# Patient Record
Sex: Female | Born: 1935
Health system: Southern US, Community
[De-identification: ages and names within clinical notes are randomized; demographics above are authoritative.]

## PROBLEM LIST (undated history)

## (undated) DIAGNOSIS — E785 Hyperlipidemia, unspecified: Secondary | ICD-10-CM

## (undated) DIAGNOSIS — Z8781 Personal history of (healed) traumatic fracture: Secondary | ICD-10-CM

## (undated) DIAGNOSIS — E538 Deficiency of other specified B group vitamins: Secondary | ICD-10-CM

## (undated) DIAGNOSIS — Z860100 Personal history of colon polyps, unspecified: Secondary | ICD-10-CM

## (undated) DIAGNOSIS — K5730 Diverticulosis of large intestine without perforation or abscess without bleeding: Secondary | ICD-10-CM

## (undated) DIAGNOSIS — E162 Hypoglycemia, unspecified: Secondary | ICD-10-CM

## (undated) DIAGNOSIS — F419 Anxiety disorder, unspecified: Secondary | ICD-10-CM

## (undated) DIAGNOSIS — D649 Anemia, unspecified: Secondary | ICD-10-CM

## (undated) DIAGNOSIS — G8929 Other chronic pain: Secondary | ICD-10-CM

## (undated) DIAGNOSIS — M1990 Unspecified osteoarthritis, unspecified site: Secondary | ICD-10-CM

## (undated) DIAGNOSIS — G629 Polyneuropathy, unspecified: Secondary | ICD-10-CM

## (undated) DIAGNOSIS — K5520 Angiodysplasia of colon without hemorrhage: Secondary | ICD-10-CM

## (undated) DIAGNOSIS — S329XXA Fracture of unspecified parts of lumbosacral spine and pelvis, initial encounter for closed fracture: Secondary | ICD-10-CM

## (undated) DIAGNOSIS — I10 Essential (primary) hypertension: Secondary | ICD-10-CM

## (undated) DIAGNOSIS — K219 Gastro-esophageal reflux disease without esophagitis: Secondary | ICD-10-CM

## (undated) DIAGNOSIS — M545 Low back pain: Secondary | ICD-10-CM

## (undated) HISTORY — PX: COLONOSCOPY W/ POLYPECTOMY: SHX1380

## (undated) HISTORY — PX: FACIAL COSMETIC SURGERY: SHX629

## (undated) HISTORY — DX: Diverticulosis of large intestine without perforation or abscess without bleeding: K57.30

## (undated) HISTORY — DX: Hyperlipidemia, unspecified: E78.5

## (undated) HISTORY — DX: Low back pain: M54.5

## (undated) HISTORY — DX: Essential (primary) hypertension: I10

## (undated) HISTORY — DX: Other chronic pain: G89.29

## (undated) HISTORY — PX: TUBAL LIGATION: SHX77

## (undated) HISTORY — PX: DILATION AND CURETTAGE OF UTERUS: SHX78

## (undated) HISTORY — DX: Unspecified osteoarthritis, unspecified site: M19.90

## (undated) HISTORY — DX: Personal history of colon polyps, unspecified: Z86.0100

## (undated) HISTORY — DX: Gastro-esophageal reflux disease without esophagitis: K21.9

## (undated) HISTORY — DX: Personal history of colonic polyps: Z86.010

## (undated) HISTORY — PX: TEAR DUCT PROBING: SHX793

## (undated) HISTORY — DX: Angiodysplasia of colon without hemorrhage: K55.20

## (undated) HISTORY — PX: HEMORROIDECTOMY: SUR656

## (undated) HISTORY — DX: Polyneuropathy, unspecified: G62.9

## (undated) HISTORY — DX: Hypoglycemia, unspecified: E16.2

## (undated) HISTORY — DX: Anxiety disorder, unspecified: F41.9

## (undated) HISTORY — DX: Fracture of unspecified parts of lumbosacral spine and pelvis, initial encounter for closed fracture: S32.9XXA

## (undated) HISTORY — DX: Deficiency of other specified B group vitamins: E53.8

## (undated) HISTORY — PX: OTHER SURGICAL HISTORY: SHX169

## (undated) HISTORY — DX: Personal history of (healed) traumatic fracture: Z87.81

---

## 1997-09-11 ENCOUNTER — Ambulatory Visit (HOSPITAL_COMMUNITY): Admission: RE | Admit: 1997-09-11 | Discharge: 1997-09-11 | Payer: Self-pay | Admitting: Internal Medicine

## 1998-04-05 HISTORY — PX: TOTAL HIP ARTHROPLASTY: SHX124

## 1998-05-22 ENCOUNTER — Encounter: Payer: Self-pay | Admitting: Orthopaedic Surgery

## 1998-05-27 ENCOUNTER — Inpatient Hospital Stay (HOSPITAL_COMMUNITY): Admission: RE | Admit: 1998-05-27 | Discharge: 1998-06-02 | Payer: Self-pay | Admitting: Orthopaedic Surgery

## 1998-05-27 ENCOUNTER — Encounter: Payer: Self-pay | Admitting: Orthopaedic Surgery

## 1999-03-31 ENCOUNTER — Encounter: Payer: Self-pay | Admitting: Orthopaedic Surgery

## 1999-03-31 ENCOUNTER — Encounter: Admission: RE | Admit: 1999-03-31 | Discharge: 1999-03-31 | Payer: Self-pay | Admitting: Orthopaedic Surgery

## 1999-08-31 ENCOUNTER — Encounter: Admission: RE | Admit: 1999-08-31 | Discharge: 1999-08-31 | Payer: Self-pay | Admitting: Obstetrics and Gynecology

## 1999-08-31 ENCOUNTER — Encounter: Payer: Self-pay | Admitting: Obstetrics and Gynecology

## 1999-09-04 ENCOUNTER — Encounter: Payer: Self-pay | Admitting: Obstetrics and Gynecology

## 1999-09-04 ENCOUNTER — Encounter: Admission: RE | Admit: 1999-09-04 | Discharge: 1999-09-04 | Payer: Self-pay

## 2000-09-27 ENCOUNTER — Encounter: Admission: RE | Admit: 2000-09-27 | Discharge: 2000-09-27 | Payer: Self-pay | Admitting: Obstetrics and Gynecology

## 2000-09-27 ENCOUNTER — Encounter: Payer: Self-pay | Admitting: Obstetrics and Gynecology

## 2000-10-04 ENCOUNTER — Encounter: Admission: RE | Admit: 2000-10-04 | Discharge: 2000-10-04 | Payer: Self-pay | Admitting: Orthopedic Surgery

## 2000-10-04 ENCOUNTER — Encounter: Payer: Self-pay | Admitting: Orthopedic Surgery

## 2001-10-04 ENCOUNTER — Encounter: Admission: RE | Admit: 2001-10-04 | Discharge: 2001-10-04 | Payer: Self-pay | Admitting: Obstetrics and Gynecology

## 2001-10-04 ENCOUNTER — Encounter: Payer: Self-pay | Admitting: Obstetrics and Gynecology

## 2002-10-18 ENCOUNTER — Encounter: Admission: RE | Admit: 2002-10-18 | Discharge: 2002-10-18 | Payer: Self-pay | Admitting: Obstetrics and Gynecology

## 2002-10-18 ENCOUNTER — Encounter: Payer: Self-pay | Admitting: Obstetrics and Gynecology

## 2003-11-14 ENCOUNTER — Encounter: Admission: RE | Admit: 2003-11-14 | Discharge: 2003-11-14 | Payer: Self-pay | Admitting: Obstetrics and Gynecology

## 2004-02-12 ENCOUNTER — Ambulatory Visit: Payer: Self-pay | Admitting: Internal Medicine

## 2004-04-05 DIAGNOSIS — E785 Hyperlipidemia, unspecified: Secondary | ICD-10-CM

## 2004-04-05 HISTORY — DX: Hyperlipidemia, unspecified: E78.5

## 2004-10-30 ENCOUNTER — Ambulatory Visit: Payer: Self-pay | Admitting: Internal Medicine

## 2004-11-05 ENCOUNTER — Ambulatory Visit: Payer: Self-pay

## 2004-11-19 ENCOUNTER — Encounter: Admission: RE | Admit: 2004-11-19 | Discharge: 2004-11-19 | Payer: Self-pay | Admitting: Obstetrics and Gynecology

## 2004-11-23 ENCOUNTER — Ambulatory Visit: Payer: Self-pay | Admitting: Internal Medicine

## 2004-12-03 ENCOUNTER — Encounter: Admission: RE | Admit: 2004-12-03 | Discharge: 2004-12-03 | Payer: Self-pay | Admitting: Obstetrics and Gynecology

## 2005-01-22 ENCOUNTER — Ambulatory Visit: Payer: Self-pay | Admitting: Internal Medicine

## 2005-04-05 DIAGNOSIS — K5520 Angiodysplasia of colon without hemorrhage: Secondary | ICD-10-CM

## 2005-04-05 HISTORY — DX: Angiodysplasia of colon without hemorrhage: K55.20

## 2005-05-10 ENCOUNTER — Ambulatory Visit: Payer: Self-pay | Admitting: Internal Medicine

## 2005-05-13 ENCOUNTER — Encounter: Admission: RE | Admit: 2005-05-13 | Discharge: 2005-05-13 | Payer: Self-pay | Admitting: Obstetrics and Gynecology

## 2005-09-06 ENCOUNTER — Ambulatory Visit: Payer: Self-pay | Admitting: Internal Medicine

## 2005-11-03 ENCOUNTER — Ambulatory Visit: Payer: Self-pay | Admitting: Internal Medicine

## 2005-11-09 ENCOUNTER — Ambulatory Visit: Payer: Self-pay | Admitting: Internal Medicine

## 2005-11-12 ENCOUNTER — Ambulatory Visit: Payer: Self-pay | Admitting: Family Medicine

## 2005-12-02 ENCOUNTER — Encounter: Admission: RE | Admit: 2005-12-02 | Discharge: 2005-12-02 | Payer: Self-pay | Admitting: Obstetrics and Gynecology

## 2005-12-10 ENCOUNTER — Ambulatory Visit: Payer: Self-pay | Admitting: Gastroenterology

## 2005-12-13 ENCOUNTER — Ambulatory Visit: Payer: Self-pay | Admitting: Gastroenterology

## 2006-01-25 ENCOUNTER — Ambulatory Visit: Payer: Self-pay | Admitting: Internal Medicine

## 2006-02-21 ENCOUNTER — Ambulatory Visit (HOSPITAL_COMMUNITY): Admission: RE | Admit: 2006-02-21 | Discharge: 2006-02-21 | Payer: Self-pay | Admitting: Specialist

## 2006-03-21 ENCOUNTER — Ambulatory Visit (HOSPITAL_COMMUNITY): Admission: RE | Admit: 2006-03-21 | Discharge: 2006-03-21 | Payer: Self-pay | Admitting: Specialist

## 2006-06-30 DIAGNOSIS — G609 Hereditary and idiopathic neuropathy, unspecified: Secondary | ICD-10-CM | POA: Insufficient documentation

## 2006-06-30 DIAGNOSIS — E162 Hypoglycemia, unspecified: Secondary | ICD-10-CM

## 2006-08-05 ENCOUNTER — Ambulatory Visit: Payer: Self-pay | Admitting: Internal Medicine

## 2006-08-05 LAB — CONVERTED CEMR LAB
Basophils Relative: 0.2 % (ref 0.0–1.0)
CK-MB: 0.8 ng/mL (ref 0.3–4.0)
Eosinophils Absolute: 0.1 10*3/uL (ref 0.0–0.6)
HCT: 36.4 % (ref 36.0–46.0)
Hemoglobin: 12 g/dL (ref 12.0–15.0)
Lymphocytes Relative: 32.5 % (ref 12.0–46.0)
MCHC: 33 g/dL (ref 30.0–36.0)
Monocytes Absolute: 0.6 10*3/uL (ref 0.2–0.7)
Monocytes Relative: 10.1 % (ref 3.0–11.0)
Neutrophils Relative %: 55.5 % (ref 43.0–77.0)
Total CK: 50 units/L (ref 7–177)
WBC: 6.2 10*3/uL (ref 4.5–10.5)

## 2006-08-10 ENCOUNTER — Encounter: Payer: Self-pay | Admitting: Internal Medicine

## 2006-08-10 ENCOUNTER — Ambulatory Visit: Payer: Self-pay | Admitting: Internal Medicine

## 2006-08-17 ENCOUNTER — Ambulatory Visit: Payer: Self-pay | Admitting: Internal Medicine

## 2006-09-26 ENCOUNTER — Encounter: Payer: Self-pay | Admitting: Internal Medicine

## 2006-12-07 ENCOUNTER — Encounter: Admission: RE | Admit: 2006-12-07 | Discharge: 2006-12-07 | Payer: Self-pay | Admitting: Obstetrics and Gynecology

## 2007-01-23 ENCOUNTER — Ambulatory Visit: Payer: Self-pay | Admitting: Internal Medicine

## 2007-01-23 DIAGNOSIS — E782 Mixed hyperlipidemia: Secondary | ICD-10-CM

## 2007-01-23 DIAGNOSIS — I10 Essential (primary) hypertension: Secondary | ICD-10-CM | POA: Insufficient documentation

## 2007-01-23 DIAGNOSIS — M810 Age-related osteoporosis without current pathological fracture: Secondary | ICD-10-CM

## 2007-01-23 LAB — CONVERTED CEMR LAB
Cholesterol, target level: 200 mg/dL
HDL goal, serum: 40 mg/dL
LDL Goal: 160 mg/dL

## 2007-01-25 ENCOUNTER — Encounter (INDEPENDENT_AMBULATORY_CARE_PROVIDER_SITE_OTHER): Payer: Self-pay | Admitting: *Deleted

## 2007-02-01 ENCOUNTER — Ambulatory Visit: Payer: Self-pay | Admitting: Internal Medicine

## 2007-02-01 ENCOUNTER — Encounter (INDEPENDENT_AMBULATORY_CARE_PROVIDER_SITE_OTHER): Payer: Self-pay | Admitting: *Deleted

## 2007-05-17 ENCOUNTER — Ambulatory Visit: Payer: Self-pay | Admitting: Internal Medicine

## 2007-05-17 LAB — CONVERTED CEMR LAB
Protein, U semiquant: NEGATIVE
Specific Gravity, Urine: 1.015
Urobilinogen, UA: NEGATIVE
pH: 6.5

## 2007-05-22 ENCOUNTER — Encounter (INDEPENDENT_AMBULATORY_CARE_PROVIDER_SITE_OTHER): Payer: Self-pay | Admitting: *Deleted

## 2007-06-08 ENCOUNTER — Telehealth (INDEPENDENT_AMBULATORY_CARE_PROVIDER_SITE_OTHER): Payer: Self-pay | Admitting: *Deleted

## 2007-08-16 ENCOUNTER — Telehealth (INDEPENDENT_AMBULATORY_CARE_PROVIDER_SITE_OTHER): Payer: Self-pay | Admitting: *Deleted

## 2007-08-17 ENCOUNTER — Encounter (INDEPENDENT_AMBULATORY_CARE_PROVIDER_SITE_OTHER): Payer: Self-pay | Admitting: *Deleted

## 2007-08-17 ENCOUNTER — Ambulatory Visit: Payer: Self-pay | Admitting: Internal Medicine

## 2007-08-17 DIAGNOSIS — R5381 Other malaise: Secondary | ICD-10-CM

## 2007-08-17 DIAGNOSIS — R5383 Other fatigue: Secondary | ICD-10-CM

## 2007-08-17 DIAGNOSIS — F411 Generalized anxiety disorder: Secondary | ICD-10-CM | POA: Insufficient documentation

## 2007-08-17 LAB — CONVERTED CEMR LAB
Basophils Relative: 0.4 % (ref 0.0–1.0)
Eosinophils Absolute: 0.1 10*3/uL (ref 0.0–0.7)
Eosinophils Relative: 1.4 % (ref 0.0–5.0)
Hemoglobin: 13.1 g/dL (ref 12.0–15.0)
MCV: 98.8 fL (ref 78.0–100.0)
Neutrophils Relative %: 57.4 % (ref 43.0–77.0)
Potassium: 4.9 meq/L (ref 3.5–5.1)
RBC: 3.98 M/uL (ref 3.87–5.11)
RDW: 14.1 % (ref 11.5–14.6)

## 2007-08-22 ENCOUNTER — Encounter: Payer: Self-pay | Admitting: Internal Medicine

## 2007-08-24 ENCOUNTER — Encounter (INDEPENDENT_AMBULATORY_CARE_PROVIDER_SITE_OTHER): Payer: Self-pay | Admitting: *Deleted

## 2007-09-08 DIAGNOSIS — K5730 Diverticulosis of large intestine without perforation or abscess without bleeding: Secondary | ICD-10-CM | POA: Insufficient documentation

## 2007-09-08 DIAGNOSIS — Z860100 Personal history of colon polyps, unspecified: Secondary | ICD-10-CM | POA: Insufficient documentation

## 2007-09-08 DIAGNOSIS — K5521 Angiodysplasia of colon with hemorrhage: Secondary | ICD-10-CM | POA: Insufficient documentation

## 2007-09-08 DIAGNOSIS — M1990 Unspecified osteoarthritis, unspecified site: Secondary | ICD-10-CM | POA: Insufficient documentation

## 2007-09-08 DIAGNOSIS — M129 Arthropathy, unspecified: Secondary | ICD-10-CM | POA: Insufficient documentation

## 2007-09-12 ENCOUNTER — Ambulatory Visit: Payer: Self-pay | Admitting: Gastroenterology

## 2007-09-15 ENCOUNTER — Ambulatory Visit: Payer: Self-pay | Admitting: Internal Medicine

## 2007-09-15 DIAGNOSIS — T887XXA Unspecified adverse effect of drug or medicament, initial encounter: Secondary | ICD-10-CM

## 2007-12-25 ENCOUNTER — Telehealth (INDEPENDENT_AMBULATORY_CARE_PROVIDER_SITE_OTHER): Payer: Self-pay | Admitting: *Deleted

## 2008-01-10 ENCOUNTER — Encounter: Admission: RE | Admit: 2008-01-10 | Discharge: 2008-01-10 | Payer: Self-pay | Admitting: Neurology

## 2008-01-10 ENCOUNTER — Encounter (INDEPENDENT_AMBULATORY_CARE_PROVIDER_SITE_OTHER): Payer: Self-pay | Admitting: *Deleted

## 2008-01-23 ENCOUNTER — Encounter: Payer: Self-pay | Admitting: Internal Medicine

## 2008-01-24 ENCOUNTER — Ambulatory Visit: Payer: Self-pay | Admitting: Internal Medicine

## 2008-01-26 ENCOUNTER — Encounter (INDEPENDENT_AMBULATORY_CARE_PROVIDER_SITE_OTHER): Payer: Self-pay | Admitting: *Deleted

## 2008-01-26 LAB — CONVERTED CEMR LAB
Alkaline Phosphatase: 48 units/L (ref 39–117)
BUN: 18 mg/dL (ref 6–23)
Basophils Absolute: 0 10*3/uL (ref 0.0–0.1)
CO2: 31 meq/L (ref 19–32)
Chloride: 104 meq/L (ref 96–112)
Cholesterol: 225 mg/dL (ref 0–200)
Creatinine, Ser: 1.1 mg/dL (ref 0.4–1.2)
Eosinophils Absolute: 0.1 10*3/uL (ref 0.0–0.7)
Eosinophils Relative: 2 % (ref 0.0–5.0)
Hemoglobin: 13.2 g/dL (ref 12.0–15.0)
MCHC: 33.2 g/dL (ref 30.0–36.0)
Monocytes Relative: 6.2 % (ref 3.0–12.0)
Neutro Abs: 3.6 10*3/uL (ref 1.4–7.7)
Neutrophils Relative %: 61.2 % (ref 43.0–77.0)
Potassium: 4.7 meq/L (ref 3.5–5.1)
RDW: 13.1 % (ref 11.5–14.6)
TSH: 2.09 microintl units/mL (ref 0.35–5.50)
Triglycerides: 96 mg/dL (ref 0–149)
VLDL: 19 mg/dL (ref 0–40)
Vit D, 1,25-Dihydroxy: 45 (ref 30–89)

## 2008-01-29 ENCOUNTER — Ambulatory Visit: Payer: Self-pay | Admitting: Family Medicine

## 2008-01-29 ENCOUNTER — Encounter: Payer: Self-pay | Admitting: Internal Medicine

## 2008-02-06 ENCOUNTER — Encounter: Admission: RE | Admit: 2008-02-06 | Discharge: 2008-02-06 | Payer: Self-pay | Admitting: Gynecology

## 2008-02-19 ENCOUNTER — Encounter (INDEPENDENT_AMBULATORY_CARE_PROVIDER_SITE_OTHER): Payer: Self-pay | Admitting: *Deleted

## 2008-02-19 ENCOUNTER — Telehealth (INDEPENDENT_AMBULATORY_CARE_PROVIDER_SITE_OTHER): Payer: Self-pay | Admitting: *Deleted

## 2008-06-01 ENCOUNTER — Ambulatory Visit: Payer: Self-pay | Admitting: Family Medicine

## 2008-06-01 LAB — CONVERTED CEMR LAB
Bilirubin Urine: NEGATIVE
Ketones, urine, test strip: NEGATIVE
Nitrite: NEGATIVE
Protein, U semiquant: NEGATIVE
Specific Gravity, Urine: 1.015
Urobilinogen, UA: 0.2

## 2008-07-30 ENCOUNTER — Encounter: Payer: Self-pay | Admitting: Internal Medicine

## 2008-10-02 ENCOUNTER — Ambulatory Visit: Payer: Self-pay | Admitting: Internal Medicine

## 2008-10-08 ENCOUNTER — Encounter (INDEPENDENT_AMBULATORY_CARE_PROVIDER_SITE_OTHER): Payer: Self-pay | Admitting: *Deleted

## 2008-10-08 LAB — CONVERTED CEMR LAB
ALT: 16 units/L (ref 0–35)
AST: 19 units/L (ref 0–37)
Basophils Relative: 0.3 % (ref 0.0–3.0)
Calcium: 9.8 mg/dL (ref 8.4–10.5)
Chloride: 103 meq/L (ref 96–112)
Free T4: 1.1 ng/dL (ref 0.6–1.6)
GFR calc non Af Amer: 51.56 mL/min (ref >60)
Glucose, Bld: 82 mg/dL (ref 70–99)
Lymphs Abs: 1.7 10*3/uL (ref 0.7–4.0)
MCHC: 34.9 g/dL (ref 30.0–36.0)
Monocytes Absolute: 0.6 10*3/uL (ref 0.1–1.0)
Neutro Abs: 4.5 10*3/uL (ref 1.4–7.7)
RDW: 13.4 % (ref 11.5–14.6)
Sed Rate: 18 mm/hr (ref 0–22)
TSH: 1.7 microintl units/mL (ref 0.35–5.50)
WBC: 6.8 10*3/uL (ref 4.5–10.5)

## 2008-10-09 ENCOUNTER — Ambulatory Visit: Payer: Self-pay | Admitting: Internal Medicine

## 2008-10-09 ENCOUNTER — Encounter (INDEPENDENT_AMBULATORY_CARE_PROVIDER_SITE_OTHER): Payer: Self-pay | Admitting: *Deleted

## 2008-10-09 LAB — CONVERTED CEMR LAB: OCCULT 1: NEGATIVE

## 2008-10-23 ENCOUNTER — Telehealth (INDEPENDENT_AMBULATORY_CARE_PROVIDER_SITE_OTHER): Payer: Self-pay | Admitting: *Deleted

## 2009-01-16 ENCOUNTER — Ambulatory Visit (HOSPITAL_COMMUNITY): Admission: RE | Admit: 2009-01-16 | Discharge: 2009-01-16 | Payer: Self-pay | Admitting: Orthopedic Surgery

## 2009-02-11 ENCOUNTER — Encounter: Admission: RE | Admit: 2009-02-11 | Discharge: 2009-02-11 | Payer: Self-pay | Admitting: Gynecology

## 2009-02-19 ENCOUNTER — Encounter: Payer: Self-pay | Admitting: Internal Medicine

## 2009-02-20 ENCOUNTER — Encounter: Payer: Self-pay | Admitting: Internal Medicine

## 2009-03-06 ENCOUNTER — Ambulatory Visit: Payer: Self-pay | Admitting: Internal Medicine

## 2009-03-06 DIAGNOSIS — R109 Unspecified abdominal pain: Secondary | ICD-10-CM

## 2009-03-06 DIAGNOSIS — D487 Neoplasm of uncertain behavior of other specified sites: Secondary | ICD-10-CM

## 2009-03-06 LAB — CONVERTED CEMR LAB
Glucose, Urine, Semiquant: NEGATIVE
Ketones, urine, test strip: NEGATIVE
Nitrite: NEGATIVE

## 2009-03-07 ENCOUNTER — Encounter (INDEPENDENT_AMBULATORY_CARE_PROVIDER_SITE_OTHER): Payer: Self-pay | Admitting: *Deleted

## 2009-03-07 LAB — CONVERTED CEMR LAB
Basophils Absolute: 0 10*3/uL (ref 0.0–0.1)
Basophils Relative: 0.5 % (ref 0.0–3.0)
CO2: 29 meq/L (ref 19–32)
Creatinine, Ser: 1.2 mg/dL (ref 0.4–1.2)
Eosinophils Absolute: 0.1 10*3/uL (ref 0.0–0.7)
Eosinophils Relative: 1.8 % (ref 0.0–5.0)
GFR calc non Af Amer: 46.58 mL/min (ref >60)
Glucose, Bld: 77 mg/dL (ref 70–99)
HDL: 81.3 mg/dL (ref >39.00)
Hemoglobin: 12.3 g/dL (ref 12.0–15.0)
Lymphs Abs: 1.6 10*3/uL (ref 0.7–4.0)
MCHC: 33 g/dL (ref 30.0–36.0)
MCV: 94.9 fL (ref 78.0–100.0)
Monocytes Relative: 8.9 % (ref 3.0–12.0)
Platelets: 386 10*3/uL (ref 150.0–400.0)
Potassium: 4.1 meq/L (ref 3.5–5.1)
RBC: 3.93 M/uL (ref 3.87–5.11)
Sodium: 140 meq/L (ref 135–145)
TSH: 2.07 microintl units/mL (ref 0.35–5.50)
Total CHOL/HDL Ratio: 3

## 2009-03-12 ENCOUNTER — Telehealth (INDEPENDENT_AMBULATORY_CARE_PROVIDER_SITE_OTHER): Payer: Self-pay | Admitting: *Deleted

## 2009-04-05 HISTORY — PX: COLONOSCOPY: SHX174

## 2009-04-10 ENCOUNTER — Encounter: Payer: Self-pay | Admitting: Internal Medicine

## 2009-06-23 ENCOUNTER — Ambulatory Visit: Payer: Self-pay | Admitting: Internal Medicine

## 2009-07-09 ENCOUNTER — Telehealth (INDEPENDENT_AMBULATORY_CARE_PROVIDER_SITE_OTHER): Payer: Self-pay | Admitting: *Deleted

## 2009-07-21 ENCOUNTER — Encounter: Payer: Self-pay | Admitting: Internal Medicine

## 2009-08-06 ENCOUNTER — Telehealth: Payer: Self-pay | Admitting: Gastroenterology

## 2009-08-12 ENCOUNTER — Ambulatory Visit: Payer: Self-pay | Admitting: Internal Medicine

## 2009-08-12 DIAGNOSIS — R0609 Other forms of dyspnea: Secondary | ICD-10-CM

## 2009-08-12 DIAGNOSIS — R0989 Other specified symptoms and signs involving the circulatory and respiratory systems: Secondary | ICD-10-CM

## 2009-08-12 DIAGNOSIS — K219 Gastro-esophageal reflux disease without esophagitis: Secondary | ICD-10-CM | POA: Insufficient documentation

## 2009-08-19 DIAGNOSIS — R079 Chest pain, unspecified: Secondary | ICD-10-CM | POA: Insufficient documentation

## 2009-08-21 ENCOUNTER — Ambulatory Visit: Payer: Self-pay | Admitting: Internal Medicine

## 2009-08-22 LAB — CONVERTED CEMR LAB
Alkaline Phosphatase: 70 units/L (ref 39–117)
BUN: 26 mg/dL — ABNORMAL HIGH (ref 6–23)
Basophils Absolute: 0.1 10*3/uL (ref 0.0–0.1)
Basophils Relative: 0.7 % (ref 0.0–3.0)
Bilirubin, Direct: 0.1 mg/dL (ref 0.0–0.3)
Calcium: 9.5 mg/dL (ref 8.4–10.5)
Chloride: 100 meq/L (ref 96–112)
Creatinine, Ser: 1.5 mg/dL — ABNORMAL HIGH (ref 0.4–1.2)
Eosinophils Absolute: 0.5 10*3/uL (ref 0.0–0.7)
Eosinophils Relative: 4.9 % (ref 0.0–5.0)
GFR calc non Af Amer: 37.1 mL/min (ref >60)
Glucose, Bld: 85 mg/dL (ref 70–99)
HCT: 34.5 % — ABNORMAL LOW (ref 36.0–46.0)
Lymphs Abs: 1.6 10*3/uL (ref 0.7–4.0)
Monocytes Relative: 7.8 % (ref 3.0–12.0)
Neutro Abs: 6.6 10*3/uL (ref 1.4–7.7)
Potassium: 5.9 meq/L — ABNORMAL HIGH (ref 3.5–5.1)
Pro B Natriuretic peptide (BNP): 18.8 pg/mL (ref 0.0–100.0)
RBC: 3.92 M/uL (ref 3.87–5.11)
RDW: 16.3 % — ABNORMAL HIGH (ref 11.5–14.6)
Sodium: 138 meq/L (ref 135–145)

## 2009-08-25 ENCOUNTER — Ambulatory Visit: Payer: Self-pay | Admitting: Internal Medicine

## 2009-08-27 ENCOUNTER — Ambulatory Visit: Payer: Self-pay | Admitting: Internal Medicine

## 2009-08-28 ENCOUNTER — Encounter: Payer: Self-pay | Admitting: Internal Medicine

## 2009-08-29 DIAGNOSIS — R930 Abnormal findings on diagnostic imaging of skull and head, not elsewhere classified: Secondary | ICD-10-CM | POA: Insufficient documentation

## 2009-08-29 LAB — CONVERTED CEMR LAB
BUN: 21 mg/dL (ref 6–23)
CO2: 31 meq/L (ref 19–32)
Chloride: 99 meq/L (ref 96–112)
Creatinine, Ser: 1.2 mg/dL (ref 0.4–1.2)
GFR calc non Af Amer: 47.43 mL/min (ref >60)

## 2009-09-04 ENCOUNTER — Ambulatory Visit: Payer: Self-pay | Admitting: Internal Medicine

## 2009-09-08 ENCOUNTER — Ambulatory Visit: Payer: Self-pay | Admitting: Internal Medicine

## 2009-09-10 LAB — CONVERTED CEMR LAB
CO2: 28 meq/L (ref 19–32)
Chloride: 105 meq/L (ref 96–112)
Potassium: 5 meq/L (ref 3.5–5.1)
Sodium: 143 meq/L (ref 135–145)

## 2009-09-17 ENCOUNTER — Telehealth (INDEPENDENT_AMBULATORY_CARE_PROVIDER_SITE_OTHER): Payer: Self-pay | Admitting: *Deleted

## 2009-09-18 ENCOUNTER — Encounter: Payer: Self-pay | Admitting: Cardiovascular Disease

## 2009-09-18 ENCOUNTER — Ambulatory Visit: Payer: Self-pay

## 2009-09-18 ENCOUNTER — Encounter: Payer: Self-pay | Admitting: Internal Medicine

## 2009-09-18 ENCOUNTER — Encounter (HOSPITAL_COMMUNITY): Admission: RE | Admit: 2009-09-18 | Discharge: 2009-09-18 | Payer: Self-pay | Admitting: Internal Medicine

## 2009-09-18 ENCOUNTER — Ambulatory Visit: Payer: Self-pay | Admitting: Cardiovascular Disease

## 2009-09-18 ENCOUNTER — Ambulatory Visit (HOSPITAL_COMMUNITY): Admission: RE | Admit: 2009-09-18 | Discharge: 2009-09-18 | Payer: Self-pay | Admitting: Internal Medicine

## 2009-09-19 ENCOUNTER — Ambulatory Visit: Payer: Self-pay | Admitting: Internal Medicine

## 2009-09-30 ENCOUNTER — Encounter (INDEPENDENT_AMBULATORY_CARE_PROVIDER_SITE_OTHER): Payer: Self-pay | Admitting: *Deleted

## 2009-09-30 ENCOUNTER — Ambulatory Visit: Payer: Self-pay | Admitting: Gastroenterology

## 2009-09-30 LAB — CONVERTED CEMR LAB
ALT: 13 units/L (ref 0–35)
AST: 17 units/L (ref 0–37)
Alkaline Phosphatase: 61 units/L (ref 39–117)
BUN: 18 mg/dL (ref 6–23)
Bilirubin, Direct: 0.1 mg/dL (ref 0.0–0.3)
CO2: 30 meq/L (ref 19–32)
Calcium: 9.3 mg/dL (ref 8.4–10.5)
Creatinine, Ser: 1.4 mg/dL — ABNORMAL HIGH (ref 0.4–1.2)
Ferritin: 22.7 ng/mL (ref 10.0–291.0)
Folate: >20 ng/mL
GFR calc non Af Amer: 39.92 mL/min (ref >60)
HCT: 32.7 % — ABNORMAL LOW (ref 36.0–46.0)
IgA: 325 mg/dL (ref 68–378)
Lymphocytes Relative: 25.2 % (ref 12.0–46.0)
Monocytes Absolute: 0.7 10*3/uL (ref 0.1–1.0)
Platelets: 449 10*3/uL — ABNORMAL HIGH (ref 150.0–400.0)
Potassium: 5.4 meq/L — ABNORMAL HIGH (ref 3.5–5.1)
RBC: 3.69 M/uL — ABNORMAL LOW (ref 3.87–5.11)
RDW: 17.2 % — ABNORMAL HIGH (ref 11.5–14.6)
TSH: 3.4 microintl units/mL (ref 0.35–5.50)
Total Bilirubin: 0.4 mg/dL (ref 0.3–1.2)
WBC: 7.4 10*3/uL (ref 4.5–10.5)

## 2009-10-01 DIAGNOSIS — E538 Deficiency of other specified B group vitamins: Secondary | ICD-10-CM

## 2009-10-02 ENCOUNTER — Ambulatory Visit: Payer: Self-pay | Admitting: Gastroenterology

## 2009-10-03 ENCOUNTER — Ambulatory Visit: Payer: Self-pay | Admitting: Internal Medicine

## 2009-10-03 DIAGNOSIS — R0602 Shortness of breath: Secondary | ICD-10-CM

## 2009-10-03 LAB — CONVERTED CEMR LAB
Creatinine, Ser: 1.1 mg/dL (ref 0.4–1.2)
Glucose, Bld: 79 mg/dL (ref 70–99)
Potassium: 4.8 meq/L (ref 3.5–5.1)
Sodium: 140 meq/L (ref 135–145)

## 2009-10-09 ENCOUNTER — Ambulatory Visit: Payer: Self-pay | Admitting: Gastroenterology

## 2009-10-10 ENCOUNTER — Ambulatory Visit: Payer: Self-pay | Admitting: Gastroenterology

## 2009-10-10 LAB — CONVERTED CEMR LAB: UREASE: NEGATIVE

## 2009-10-16 ENCOUNTER — Ambulatory Visit: Payer: Self-pay | Admitting: Gastroenterology

## 2009-10-16 ENCOUNTER — Telehealth: Payer: Self-pay | Admitting: Gastroenterology

## 2009-10-29 ENCOUNTER — Ambulatory Visit: Payer: Self-pay | Admitting: Internal Medicine

## 2009-10-30 LAB — CONVERTED CEMR LAB
Basophils Relative: 1 % (ref 0.0–3.0)
CO2: 26 meq/L (ref 19–32)
Calcium: 8.7 mg/dL (ref 8.4–10.5)
Creatinine, Ser: 1.2 mg/dL (ref 0.4–1.2)
Eosinophils Relative: 7.8 % — ABNORMAL HIGH (ref 0.0–5.0)
Glucose, Bld: 91 mg/dL (ref 70–99)
Lymphs Abs: 1.9 10*3/uL (ref 0.7–4.0)
MCV: 89.7 fL (ref 78.0–100.0)
Monocytes Absolute: 0.5 10*3/uL (ref 0.1–1.0)
Neutrophils Relative %: 63.9 % (ref 43.0–77.0)
Platelets: 462 10*3/uL — ABNORMAL HIGH (ref 150.0–400.0)
aPTT: 32.6 s — ABNORMAL HIGH (ref 21.7–28.8)

## 2009-10-31 ENCOUNTER — Inpatient Hospital Stay (HOSPITAL_BASED_OUTPATIENT_CLINIC_OR_DEPARTMENT_OTHER): Admission: RE | Admit: 2009-10-31 | Discharge: 2009-10-31 | Payer: Self-pay | Admitting: Internal Medicine

## 2009-10-31 ENCOUNTER — Ambulatory Visit: Payer: Self-pay | Admitting: Internal Medicine

## 2009-11-17 ENCOUNTER — Ambulatory Visit: Payer: Self-pay | Admitting: Internal Medicine

## 2009-11-17 ENCOUNTER — Ambulatory Visit: Payer: Self-pay | Admitting: Gastroenterology

## 2009-12-19 ENCOUNTER — Ambulatory Visit: Payer: Self-pay | Admitting: Gastroenterology

## 2009-12-30 ENCOUNTER — Ambulatory Visit: Payer: Self-pay | Admitting: Internal Medicine

## 2009-12-31 ENCOUNTER — Encounter: Payer: Self-pay | Admitting: Gastroenterology

## 2010-01-19 ENCOUNTER — Ambulatory Visit: Payer: Self-pay | Admitting: Gastroenterology

## 2010-02-10 ENCOUNTER — Encounter: Payer: Self-pay | Admitting: Internal Medicine

## 2010-02-16 ENCOUNTER — Ambulatory Visit: Payer: Self-pay | Admitting: Gastroenterology

## 2010-02-20 ENCOUNTER — Encounter: Payer: Self-pay | Admitting: Internal Medicine

## 2010-02-20 ENCOUNTER — Encounter: Admission: RE | Admit: 2010-02-20 | Discharge: 2010-02-20 | Payer: Self-pay | Admitting: Gynecology

## 2010-03-09 ENCOUNTER — Ambulatory Visit: Payer: Self-pay | Admitting: Internal Medicine

## 2010-03-11 LAB — CONVERTED CEMR LAB
ALT: 17 units/L (ref 0–35)
BUN: 20 mg/dL (ref 6–23)
Basophils Absolute: 0 10*3/uL (ref 0.0–0.1)
Bilirubin, Direct: 0.1 mg/dL (ref 0.0–0.3)
CO2: 27 meq/L (ref 19–32)
Chloride: 105 meq/L (ref 96–112)
Creatinine, Ser: 1.3 mg/dL — ABNORMAL HIGH (ref 0.4–1.2)
Eosinophils Absolute: 0.1 10*3/uL (ref 0.0–0.7)
Eosinophils Relative: 2.9 % (ref 0.0–5.0)
GFR calc non Af Amer: 41.25 mL/min — ABNORMAL LOW (ref >60.00)
HCT: 36.6 % (ref 36.0–46.0)
Hemoglobin: 12.2 g/dL (ref 12.0–15.0)
Monocytes Absolute: 0.5 10*3/uL (ref 0.1–1.0)
Monocytes Relative: 9.5 % (ref 3.0–12.0)
Neutro Abs: 3 10*3/uL (ref 1.4–7.7)
Neutrophils Relative %: 58 % (ref 43.0–77.0)
Potassium: 5.5 meq/L — ABNORMAL HIGH (ref 3.5–5.1)
Total Protein: 6.9 g/dL (ref 6.0–8.3)
VLDL: 8.8 mg/dL (ref 0.0–40.0)

## 2010-03-19 ENCOUNTER — Ambulatory Visit: Payer: Self-pay | Admitting: Gastroenterology

## 2010-04-17 ENCOUNTER — Ambulatory Visit
Admission: RE | Admit: 2010-04-17 | Discharge: 2010-04-17 | Payer: Self-pay | Source: Home / Self Care | Attending: Internal Medicine | Admitting: Internal Medicine

## 2010-05-05 NOTE — Assessment & Plan Note (Signed)
Summary: Cardiology Nuclear Study  Nuclear Med Background Indications for Stress Test: Evaluation for Ischemia   History: Myocardial Perfusion Study  History Comments: '06 MPS: EF=63%, Ant. Apical thinning, (-) ischemia  Symptoms: DOE, Fatigue    Nuclear Pre-Procedure Cardiac Risk Factors: Family History - CAD, Hypertension, Lipids Caffeine/Decaff Intake: none NPO After: 8:30 AM Lungs: clear IV 0.9% NS with Angio Cath: 20g     IV Site: (R) AC IV Started by: Eliezer Lofts EMT-P Chest Size (in) 38     Cup Size C     Height (in): 66 Weight (lb): 175 BMI: 28.35  Nuclear Med Study 1 or 2 day study:  1 day     Stress Test Type:  Carlton Adam Reading MD:  Jenkins Rouge, MD     Referring MD:  D.Bensimhon Resting Radionuclide:  Technetium 60m Tetrofosmin     Resting Radionuclide Dose:  11 mCi  Stress Radionuclide:  Technetium 49m Tetrofosmin     Stress Radionuclide Dose:  30 mCi   Stress Protocol   Lexiscan: 0.4 mg   Stress Test Technologist:  Perrin Maltese EMT-P     Nuclear Technologist:  Charlton Amor CNMT  Rest Procedure  Myocardial perfusion imaging was performed at rest 45 minutes following the intravenous administration of Myoview Technetium 80m Tetrofosmin.  Stress Procedure  The patient received IV Lexiscan 0.4 mg over 15-seconds.  Myoview injected at 30-seconds.  There were no significant changes with infusion.  Quantitative spect images were obtained after a 45 minute delay.  QPS Raw Data Images:  Normal; no motion artifact; normal heart/lung ratio. Stress Images:  NI: Uniform and normal uptake of tracer in all myocardial segments. Rest Images:  Normal homogeneous uptake in all areas of the myocardium. Subtraction (SDS):  SDS 1 Transient Ischemic Dilatation:  1.08  (Normal <1.22)  Lung/Heart Ratio:  .33  (Normal <0.45)  Quantitative Gated Spect Images QGS EDV:  101 ml QGS ESV:  35 ml QGS EF:  65 % QGS cine images:  normal  Findings Low risk nuclear  study Evidence for anterior (septal apical) ischemia      Overall Impression  Exercise Capacity: Lexiscan BP Response: Normal blood pressure response. Clinical Symptoms: dyspnea,SSCP, abdominal pain ECG Impression: No significant ST segment change suggestive of ischemia. Overall Impression: Qualitative mild ischemia in mid and apical anterior wall.  SDS only 1   Appended Document: Cardiology Nuclear Study pt aware at [facility] 6/17  Appended Document: Cardiology Nuclear Study d/w patient at [place]

## 2010-05-05 NOTE — Progress Notes (Signed)
Summary: Trigae: Ongoing Cough  Phone Note Call from Patient Call back at Home Phone [phone]   [name]: Patient Summary of Call: Patient called to say that her symptoms are still ongoing-productive cough. Patient completed meds that were given on 06/23/2009. Patient not sure if she needs another ABX or cough med or both.  Dr.Hopper please advise   [facility] [place]  Initial call taken by: Georgette Dover,  July 09, 2009 12:05 PM  Follow-up for Phone Call        see new Rx for Doxycycline Follow-up by: [name] MD,  July 09, 2009 12:58 PM  Additional Follow-up for Phone Call Additional follow up Details #1::        rx faxed pt informed .Verdie Mosher  July 09, 2009 2:15 PM  Additional Follow-up by: Verdie Mosher,  July 09, 2009 2:15 PM    New/Updated Medications: DOXYCYCLINE HYCLATE 100 MG CAPS (DOXYCYCLINE HYCLATE) 1bid X 2 days then 1 once daily Prescriptions: DOXYCYCLINE HYCLATE 100 MG CAPS (DOXYCYCLINE HYCLATE) 1bid X 2 days then 1 once daily  #12 x 0   Entered and Authorized by:   [name] MD   Signed by:   Verdie Mosher on 07/09/2009   Method used:   Faxed to ...       [facility]  [place] # [id number]* (retail)       [address]       [place], [place]  [address]       Ph: [phone] or [phone]       Fax: [phone]   RxID:   [id number]

## 2010-05-05 NOTE — Letter (Signed)
Summary: [facility]  [facility]   Imported By: Edmonia James 09/11/2009 12:43:21  _____________________________________________________________________  External Attachment:    Type:   Image     Comment:   External Document

## 2010-05-05 NOTE — Assessment & Plan Note (Signed)
Summary: B12 SHOT  Nurse Visit   Allergies: 1)  ! Codeine  Medication Administration  Injection # 1:    Medication: Vit B12 1000 mcg    Diagnosis: B12 DEFICIENCY (ICD-266.2)    Route: IM    Site: L deltoid    Exp Date: 03/2012    Lot #: [id number]    Mfr: [employer] Regent    Patient tolerated injection without complications    Given by: Marlon Pel CMA ([place]) (October 16, 2009 11:48 AM)  Orders Added: 1)  Vit B12 1000 mcg [id number]

## 2010-05-05 NOTE — Cardiovascular Report (Signed)
Summary: Pre Cath Orders   Pre Cath Orders   Imported By: Sallee Provencal 11/13/2009 09:10:58  _____________________________________________________________________  External Attachment:    Type:   Image     Comment:   External Document

## 2010-05-05 NOTE — Letter (Signed)
Summary: Rexford Maus MD  Rexford Maus MD   Imported By: Edmonia James 03/06/2010 11:52:31  _____________________________________________________________________  External Attachment:    Type:   Image     Comment:   External Document

## 2010-05-05 NOTE — Assessment & Plan Note (Signed)
Summary: np6/DYSPNEA/SHORTNESS OF BREATH/ok per heather      Allergies Added:   Visit Type:  Initial Consult Primary Provider:  [name] MD  CC:  shortness of breath.  History of Present Illness: 77 y/o woman with h/o HTN and osteoarthritis referred by Dr. Linna Darner for further evalaution of chronic dyspnea.  Her PMHx list from Dr. Linna Darner includes CAD and hyperlipidemia but she denies ever having a cardiac cath or an MI. Never told she has had coronary disease or high cholesterol. Had a nuclear stress test 5 or 6 years ago which she says was normal.  Has never smoked. Has been getting SOB with mild activity (like shaking out clothes) for over a year. Had  an episode of bronchitis a few months ago a few months ago and feels worse since then. Now dyspneic with any activity. Has also been hoarse for 2-3 years. Saw ENT who said they couldn't find anything. Denies h/o GERD symptoms recently. But feels like she has a lump in her throat occasionally. Dr. Linna Darner started Zantac last week.   When she goes to store leans on the buggy but can walk the whole way without stopping. Walking more limited by neuropathy and arthritis. Very mild edema. No chest pain or pressure.No wheezing.  Has not had recent CXR or echo.   Has followed with Dr. Jannifer Franklin in neurology for peripheral neuropathy and apparently has had NCS. Not aware of results. He suggested she take another medication but she couldn't tolerate it. Now on Lyrica.  Daughter recently died.   Preventive Screening-Counseling & Management      Drug Use:  no.    Current Medications (verified): 1)  Quinapril Hcl 40 Mg  Tabs (Quinapril Hcl) .... Take One Half Tablet By Mouth Daily 2)  Hydrochlorothiazide 25 Mg Tabs (Hydrochlorothiazide) .... 1/2 Tab Qd 3)  Centrum Silver   Tabs (Multiple Vitamins-Minerals) .... One Tablet By Mouth Once Daily 4)  Vitamin D3 2000 Unit Caps (Cholecalciferol) .[name].. 1 By Mouth Once Daily 5)  Vitamin C 1000 Mg  Tabs  (Ascorbic Acid) .... One By Mouth Once Daily 6)  Adult Aspirin Ec Low Strength 81 Mg  Tbec (Aspirin) .... One By Mouth Once Daily 7)  Calcium 600 Mg Tabs (Calcium) .[name].. 1 By Mouth Once Daily 8)  Lyrica 100 Mg  Caps (Pregabalin) .[name].. 1 By Mouth Qhs 9)  Fish Oil 1000 Mg  Caps (Omega-3 Fatty Acids) .... One By Mouth Once Daily 10)  Amlodipine Besylate 5 Mg  Tabs (Amlodipine Besylate) .[name].. 1 Once Daily Inplace of Verapamil 11)  Alprazolam 0.25 Mg Tabs (Alprazolam) .... 1/2 -1 Q 8 -12 Hrs Prn 12)  Meclizine Hcl 25 Mg Tabs (Meclizine Hcl) .... 1/2 To 1 By Mouth Once Daily 13)  Glucosamine Sulfate 500 Mg Tabs (Glucosamine Sulfate) .... 3 By Mouth Once Daily (3 Months On, 2 Months Off) 14)  Lyrica 25 Mg Caps (Pregabalin) .... Take 1 Tab At Breakfast and At Lunch 15)  Ranitidine Hcl 150 Mg Tabs (Ranitidine Hcl) .[name].. 1 Two Times A Day Pre Meals  Allergies (verified): 1)  ! Codeine  Past History:  Past Medical History: Last updated: 08/19/2009 1. Chest pain 2. CAD 3. Dyspnea 4. HTN 5. Hyperlipidemia 6. GERD 7. Fatigue 8. Anxiety 9. Hypoglycemia 10. Peripheral Neuropathy 11. Colonic polyps 12. Diverticulosis 13. Arthritis 14. Osteoporosis 15. Pelvic pain  Family History: Reviewed history from 03/06/2009 and no changes required. No FH of Colon Cancer: Father: MI @ 70 Mother: renal failure Siblings:  bro CVA,3 bro & 1 sister lung CA, bro MI@ 47,sister osteoporosis  Social History: Reviewed history from 03/06/2009 and no changes required. Retired Never Smoked Alcohol use-yes:occasionally  -- mostly wine Regular exercise-yes: [employer] (water aerobics) Married  Drug Use - no  Review of Systems       As per HPI and past medical history; otherwise all systems negative.   Vital Signs:  Patient profile:   77 year old female Height:      65.75 inches Weight:      175 pounds BMI:     28.56 Pulse rate:   77 / minute BP sitting:   114 / 68  (left arm) Cuff size:   regular  Vitals  Entered By: Mignon Pine, RMA (Aug 21, 2009 8:56 AM)  Physical Exam  General:  Elderly frail  appearing. no resp difficulty Walks slowly with a bit of a tottering gait. Sat 95-97% HEENT: normal Neck: supple. no JVD. Carotids 2+ bilat; no bruits. No lymphadenopathy or thryomegaly appreciated. Cor: PMI nondisplaced. Regular rate & rhythm. No rubs, gallops, murmur. Lungs: clear Abdomen: soft, nontender, nondistended. No hepatosplenomegaly. No bruits or masses. Good bowel sounds. Extremities: no cyanosis, clubbing, rash, edema Neuro: ? mild speech latency.  alert & orientedx3, cranial nerves grossly intact. moves all 4 extremities w/o difficulty. affect flattened    Impression & Recommendations:  Problem # 1:  DYSPNEA/SHORTNESS OF BREATH (ICD-786.09) Very interesting case. A combination of dyspnea and exertional fatigue. I am not sure what is causing her symptoms at this point. While sshe certainly could have a component of GERD, her constellation of findings make me more suspicious of an underyling systemic neurologic process. I have contacted Dr. Jannifer Franklin to discuss and he will see her back for re-evaluation. In the meantime we will work on ruling out cardiopulmonary causes with: checking labs, CXR, echo, Lexiscan Myvoiew and PFTS with DLCO and MIP/MEP to evalaute respiratory muscle strength. I will see her back in 1 month to discuss results.   Other Orders: EKG w/ Interpretation (93000) T-Antinuclear Antib (ANA) [phone]) Nuclear Stress Test (Nuc Stress Test) Pulmonary Function Test (PFT) Echocardiogram (Echo) TLB-BMP (Basic Metabolic Panel-BMET) (99991111) TLB-BNP (B-Natriuretic Peptide) (83880-BNPR) TLB-CBC Platelet - w/Differential (85025-CBCD) TLB-Hepatic/Liver Function Pnl (80076-HEPATIC) TLB-CK Total Only(Creatine Kinase/CPK) (82550-CK) T-2 View CXR (71020TC)  Patient Instructions: 1)  Labs today 2)  A chest x-ray takes a picture of the organs and structures inside  the chest, including the heart, lungs, and blood vessels. This test can show several things, including, whether the heart is enlarged; whether fluid is building up in the lungs; and whether pacemaker / defibrillator leads are still in place. 3)  Your physician has requested that you have an echocardiogram.  Echocardiography is a painless test that uses sound waves to create images of your heart. It provides your doctor with information about the size and shape of your heart and how well your heart's chambers and valves are working.  This procedure takes approximately one hour. There are no restrictions for this procedure. 4)  Your physician has requested that you have a Lexiscan myoview.  For further information please visit [email or web address].  Please follow instruction sheet, as given. 5)  Your physician has recommended that you have a pulmonary function test.  Pulmonary Function Tests are a group of tests that measure how well air moves in and out of your lungs. 6)  Follow up in 3-4 weeks

## 2010-05-05 NOTE — Assessment & Plan Note (Signed)
Summary: Monthly B12/dfs  Nurse Visit   Allergies: 1)  ! Codeine  Medication Administration  Injection # 1:    Medication: Vit B12 1000 mcg    Diagnosis: B12 DEFICIENCY (ICD-266.2)    Route: IM    Site: L deltoid    Exp Date: 09/2011    Lot #: [id number]    Mfr: [employer] Regent    Comments: Monthly B12    Patient tolerated injection without complications    Given by: Hope Pigeon CMA (November 17, 2009 2:47 PM)  Orders Added: 1)  Vit B12 1000 mcg [id number]

## 2010-05-05 NOTE — Assessment & Plan Note (Signed)
Summary: CPX//PH   Vital Signs:  Patient profile:   77 year old female Height:      66.5 inches Weight:      175 pounds BMI:     27.92 Temp:     98.1 degrees F oral Pulse rate:   76 / minute Resp:     16 per minute BP sitting:   118 / 80  (left arm) Cuff size:   large  Vitals Entered By: Georgette Dover CMA (March 09, 2010 8:43 AM) CC: CPX with fasting labs , Heartburn   Primary Care Provider:  [name] MD  CC:  CPX with fasting labs  and Heartburn.  History of Present Illness: Here for Medicare AWV: 1.Risk factors based on Past M, S, F history:HTN; Neuropathy;GERD; B12 Deficiency ( chart updated) 2.Physical Activities: 3x/week as water aerobics 3.Depression/mood: she lost her daughter 08/19/2009, but she has adjusted; she denies significant depression 4.Hearing: whisper heard @ 6 ft 5.ADL's: no limitations 6.Fall Risk: intermittent balance issues 7.Home Safety: no issues 8.Height, weight, &visual acuity:decreased vision OS > OD, being seen @ [place] 9.Counseling: POA & Living Will in place 10.Labs ordered based on risk factors: see Orders 11.  Referral Coordination: none requested 12. Care Plan: see Instructions 13.  Cognitive Assessment: Oriented X3; memory & recall  intact  ; "WORLD" spelled  backwards; affect & mood  normal. Hypertension Follow-Up:       The patient reports lightheadedness ( Meclizine prn), urinary frequency, and intermittent  edema, but denies headaches and fatigue.  The patient denies the following associated symptoms: chest pain, chest pressure, dyspnea, palpitations, and syncope.  Compliance with medications (by patient report) has been near 100%.  Adjunctive measures currently used by the patient include salt restriction.  BP well controlled @ home. EKG done 08/21/2009 was reviewed ( normal) GERD:        The patient reports weight gain of 15#, but denies acid reflux, sour taste in mouth, epigastric pain, and trouble swallowing.  The patient  reports dark stool on Tandem, iron supplement.  The patient denies the following alarm features: dysphagia, hematemesis, and vomiting.  Prior evaluation has included EGD and colonoscopy by Dr Sharlett Iles in 10/2009.  PPI s effective  .    Preventive Screening-Counseling & Management  Alcohol-Tobacco     Alcohol drinks/day: 1     Smoking Status: never  Caffeine-Diet-Exercise     Caffeine use/day: 1 cup/day     Diet Comments: no specific diet  Hep-HIV-STD-Contraception     Dental Visit-last 6 months dentures     Sun Exposure-Excessive: no  Safety-Violence-Falls     Seat Belt Use: yes     Firearms in the Home: firearms in the home     Firearm Counseling: not indicated; uses recommended firearm safety measures     Smoke Detectors: yes      Blood Transfusions:  no.        Travel History:  [place] 2007.    Current Medications (verified): 1)  Quinapril Hcl 40 Mg  Tabs (Quinapril Hcl) .... Take One Half Tablet By Mouth Daily 2)  Centrum Silver   Tabs (Multiple Vitamins-Minerals) .... One Tablet By Mouth Once Daily 3)  Vitamin D3 2000 Unit Caps (Cholecalciferol) .[name].. 1 By Mouth Once Daily 4)  Vitamin C 1000 Mg  Tabs (Ascorbic Acid) .... One By Mouth Once Daily 5)  Adult Aspirin Ec Low Strength 81 Mg  Tbec (Aspirin) .... One By Mouth Once Daily 6)  Calcium 600  Mg Tabs (Calcium) .[name].. 1 By Mouth Once Daily 7)  Lyrica 100 Mg  Caps (Pregabalin) .[name].. 1 By Mouth Qhs 8)  Fish Oil 1000 Mg  Caps (Omega-3 Fatty Acids) .... One By Mouth Once Daily 9)  Amlodipine Besylate 5 Mg  Tabs (Amlodipine Besylate) .[name].. 1 Once Daily Inplace of Verapamil 10)  Alprazolam 0.25 Mg Tabs (Alprazolam) .... 1/2 -1 Q 8 -12 Hrs Prn 11)  Meclizine Hcl 25 Mg Tabs (Meclizine Hcl) .... 1/2 To 1 By Mouth Once Daily 12)  Glucosamine Sulfate 500 Mg Tabs (Glucosamine Sulfate) .... 3 By Mouth Once Daily (3 Months On, 2 Months Off) 13)  Lyrica 25 Mg Caps (Pregabalin) .... Take 1 Tab At Breakfast and At Lunch 14)  Furosemide 20 Mg  Tabs (Furosemide) .... Take One Tablet By Mouth  On Monday, Wednesday and Friday 15)  Protonix 40 Mg Tbec (Pantoprazole Sodium) .... Take One Tablet By Mouth Daily 16)  Cyanocobalamin 1000 Mcg/ml Inj Soln (Cyanocobalamin) .[name].. 1 Cc Im Weekly X3 17)  Se-Tan Plus 162-115.2-1 Mg Caps (Fefum-Fepo-Fa-B Cmp-C-Zn-Mn-Cu) .... Take One By Mouth Once Daily 18)  Lyrica 100 Mg Caps (Pregabalin) .... Take One Tablet At Bedtime  Allergies: 1)  ! Codeine 2)  ! Sertraline Hcl (Sertraline Hcl) 3)  ! Cymbalta (Duloxetine Hcl)  Past History:  Past Medical History: 1. Dypnea 2. HTN 3. Hyperlipidemia 4. GERD 5. Fatigue 6. Anxiety 7. Hypoglycemia 8. Peripheral Neuropathy 9. Colonic polyps 10. Diverticulosis 11. Arthritis/ DJD 12. Osteoporosis 13. Pelvic pain  Past Surgical History: Tear duct surgery X 2 Colon polypectomy (multiple polypectomies), Angiodysplasia 2007, EGD 2007: negative, Dr Verl Blalock Total hip replacement R, Dr Durward Fortes 2000 Plastic surgery, facial Tubal Ligation Hemorrhoid Surgery; G 2 P 2;D&C X1  Family History: No FH of Colon Cancer: Father: MI @ 51 Mother: renal failure Siblings: bro :CVA,3 bro & 1 sister: lung cancer, bro: MI @ 47,sister :osteoporosis  Social History: Retired Never Smoked Alcohol use-yes:occasionally  ( wine) Regular exercise-yes: [employer] (water aerobics) Married  Drug Use - no Caffeine use/day:  1 cup/day Dental Care w/in 6 mos.:  dentures Sun Exposure-Excessive:  no Seat Belt Use:  yes Blood Transfusions:  no  Review of Systems       The patient complains of vision loss.  The patient denies anorexia, hoarseness, prolonged cough, hemoptysis, hematuria, suspicious skin lesions, unusual weight change, abnormal bleeding, enlarged lymph nodes, and angioedema.    Physical Exam  General:  well-nourished,in no acute distress; alert,appropriate and cooperative throughout examination. Appears younger than age Head:  Normocephalic and  atraumatic without obvious abnormalities. Eyes:  No corneal or conjunctival inflammation noted. [name] Perrla. Funduscopic exam benign, without hemorrhages, exudates or papilledema.  Ears:  External ear exam shows no significant lesions or deformities.  Otoscopic examination reveals clear canals, tympanic membranes are intact bilaterally without bulging, retraction, inflammation or discharge. Hearing is grossly normal bilaterally. wax bilaterally Nose:  External nasal examination shows no deformity or inflammation. Nasal mucosa are pink and moist without lesions or exudates. Mouth:  Oral mucosa and oropharynx without lesions or exudates.  Dentures present Neck:  No deformities, masses, or tenderness noted. Lungs:  Normal respiratory effort, chest expands symmetrically. Lungs are clear to auscultation, no crackles or wheezes. Heart:  normal rate, regular rhythm, no gallop, no rub, no JVD, no HJR, and grade 1/2  /6 systolic murmur.   Abdomen:  Bowel sounds positive,abdomen soft and non-tender without masses, organomegaly or hernias noted. Rectal:  Dr Sharlett Iles 10/2009 Genitalia:  Dr Ubaldo Glassing 02/2010 Msk:  No deformity or scoliosis noted of thoracic or lumbar spine.   Pulses:  R and L carotid,radial, and posterior tibial pulses are full and equal bilaterally. Slightly decreased DPP  Extremities:  No clubbing, cyanosis, edema  noted with normal full range of motion of all joints.  Minor OA hands Neurologic:  alert & oriented X3 and DTRs symmetrical and normal.   Skin:  Intact without suspicious lesions or rashes Cervical Nodes:  No lymphadenopathy noted Axillary Nodes:  No palpable lymphadenopathy Psych:  memory intact for recent and remote, normally interactive, and good eye contact.     Impression & Recommendations:  Problem # 1:  PREVENTIVE HEALTH CARE (ICD-V70.0)  Orders: [facility] -Subsequent Annual Wellness Visit [phone])  Problem # 2:  HYPERTENSION, ESSENTIAL NOS (ICD-401.9)  Her updated medication  list for this problem includes:    Quinapril Hcl 40 Mg Tabs (Quinapril hcl) .[name]... Take one half tablet by mouth daily    Amlodipine Besylate 5 Mg Tabs (Amlodipine besylate) .[name]... 1 once daily inplace of verapamil    Furosemide 20 Mg Tabs (Furosemide) .[name]... Take one tablet by mouth  on monday, wednesday and friday  Orders: Venipuncture [id number]) TLB-BMP (Basic Metabolic Panel-BMET) (99991111) Specimen Handling (99000)  Problem # 3:  GERD (ICD-530.81)  Her updated medication list for this problem includes:    Protonix 40 Mg Tbec (Pantoprazole sodium) .[name]... Take one tablet by mouth daily  Problem # 4:  B12 DEFICIENCY (ICD-266.2)  Orders: Venipuncture [id number]) TLB-CBC Platelet - w/Differential (85025-CBCD) TLB-B12, Serum-Total ONLY [id number]) Specimen Handling (99000)  Problem # 5:  HYPERLIPIDEMIA (ICD-272.2)  Orders: Venipuncture [id number]) TLB-Lipid Panel (80061-LIPID) TLB-Hepatic/Liver Function Pnl (80076-HEPATIC) TLB-TSH (Thyroid Stimulating Hormone) (84443-TSH) Specimen Handling (99000)  Problem # 6:  PERIPHERAL NEUROPATHY (ICD-356.9)  Problem # 7:  OSTEOPOROSIS (ICD-733.00)  Orders: Venipuncture [id number]) T-Vitamin D (25-Hydroxy) [id number])  Complete Medication List: 1)  Quinapril Hcl 40 Mg Tabs (Quinapril hcl) .... Take one half tablet by mouth daily 2)  Centrum Silver Tabs (Multiple vitamins-minerals) .... One tablet by mouth once daily 3)  Vitamin D3 2000 Unit Caps (Cholecalciferol) .[name].. 1 by mouth once daily 4)  Vitamin C 1000 Mg Tabs (Ascorbic acid) .... One by mouth once daily 5)  Adult Aspirin Ec Low Strength 81 Mg Tbec (Aspirin) .... One by mouth once daily 6)  Calcium 600 Mg Tabs (Calcium) .[name].. 1 by mouth once daily 7)  Lyrica 100 Mg Caps (Pregabalin) .[name].. 1 by mouth qhs 8)  Fish Oil 1000 Mg Caps (Omega-3 fatty acids) .... One by mouth once daily 9)  Amlodipine Besylate 5 Mg Tabs (Amlodipine besylate) .[name].. 1 once daily inplace of verapamil 10)  Alprazolam  0.25 Mg Tabs (Alprazolam) .... 1/2 -1 q 8 -12 hrs prn 11)  Meclizine Hcl 25 Mg Tabs (Meclizine hcl) .... 1/2 to 1 by mouth once daily 12)  Glucosamine Sulfate 500 Mg Tabs (Glucosamine sulfate) .... 3 by mouth once daily (3 months on, 2 months off) 13)  Lyrica 25 Mg Caps (Pregabalin) .... Take 1 tab at breakfast and at lunch 14)  Furosemide 20 Mg Tabs (Furosemide) .... Take one tablet by mouth  on monday, wednesday and friday 15)  Protonix 40 Mg Tbec (Pantoprazole sodium) .... Take one tablet by mouth daily 16)  Cyanocobalamin 1000 Mcg/ml Inj Soln (Cyanocobalamin) .[name].. 1 cc im weekly x3 17)  Se-tan Plus 162-115.2-1 Mg Caps (Fefum-fepo-fa-b cmp-c-zn-mn-cu) .... Take one by mouth once daily 18)  Lyrica 100 Mg Caps (Pregabalin) .... Take  one tablet at bedtime  Patient Instructions: 1)  [facility] survelliance with Dr Ubaldo Glassing. 2)  Check your Blood Pressure regularly. If it is above: you should make an appointment. 3)  Avoid foods high in acid (tomatoes, citrus juices, spicy foods). Avoid eating within two hours of lying down or before exercising. Do not over eat; try smaller more frequent meals. Elevate head of bed twelve inches when sleeping. 4)  Check your Blood Pressure regularly. If it is above:135/85 ON AVERAGE  you should make an appointment.   Orders Added: 1)  [facility] -Subsequent Annual Wellness Visit [id number] 2)  Est. Patient Level III [id number] 3)  Venipuncture [id number] 4)  TLB-Lipid Panel [80061-LIPID] 5)  TLB-BMP (Basic Metabolic Panel-BMET) [id number] 6)  TLB-CBC Platelet - w/Differential [85025-CBCD] 7)  TLB-Hepatic/Liver Function Pnl [80076-HEPATIC] 8)  TLB-TSH (Thyroid Stimulating Hormone) [84443-TSH] 9)  TLB-B12, Serum-Total ONLY [82607-B12] 10)  T-Vitamin D (25-Hydroxy) [id number] 11)  Specimen Handling [99000]

## 2010-05-05 NOTE — Progress Notes (Signed)
Summary: Nuclear Pre-Procedure  Phone Note Outgoing Call Call back at [facility] Phone [phone]   Call placed by: Eliezer Lofts, EMT-P,  September 17, 2009 2:49 PM Call placed to: Patient Action Taken: Phone Call Completed Summary of Call: Reviewed information on Myoview Information Sheet (see scanned document for further details).  Spoke with Patient.    Nuclear Med Background Indications for Stress Test: Evaluation for Ischemia   History: Myocardial Perfusion Study  History Comments: '06 MPS: EF=63%, Ant. Apical thinning, (-) ischemia  Symptoms: DOE, Fatigue    Nuclear Pre-Procedure Cardiac Risk Factors: Family History - CAD, Hypertension, Lipids Height (in): 65.75

## 2010-05-05 NOTE — Assessment & Plan Note (Signed)
Summary: BECOMES NAUSEOUS MIDAFTERNOON/CBS   Vital Signs:  Patient profile:   77 year old female Weight:      182 pounds O2 Sat:      98 % Temp:     98.3 degrees F oral Pulse rate:   78 / minute Resp:     16 per minute BP sitting:   120 / 72  (left arm)  Vitals Entered By: Rolla Flatten CMA (Aug 12, 2009 3:36 PM) CC: cough no better, lump in throat, sob, feet swelling Comments REVIEWED MED LIST, PATIENT AGREED DOSE AND INSTRUCTION CORRECT    Primary Care Provider:  [name] MD  CC:  cough no better, lump in throat, sob, and feet swelling.  History of Present Illness:  Her major issue is profound fatigue & DOE for > 1 year. PMH of negative stress test ."Choking"  in throat with intermittent  clear sputum & sensation of a" lump" ; bronchitis has essentially cleared. PMH of goiter.Her father died @ 22 & bro @ 49 with MI.  Allergies: 1)  ! Codeine  Review of Systems General:  Denies chills, fever, sweats, and weight loss. Eyes:  Complains of blurring; denies double vision and vision loss-both eyes. ENT:  Complains of hoarseness; denies difficulty swallowing, nasal congestion, postnasal drainage, and sinus pressure. CV:  Complains of shortness of breath with exertion; denies chest pain or discomfort; Marked DOE with minimal exertion. Resp:  Complains of cough and sputum productive; denies chest pain with inspiration, coughing up blood, shortness of breath, and wheezing. GI:  Complains of indigestion; Dyspsepsia @ night ; Pepcid as needed with relief. MS:  Complains of joint pain; denies joint redness and joint swelling; R hip pain ; Aleve 2 once daily . Psych:  Denies anxiety and depression. Allergy:  Denies hives or rash; No angioedema.  Physical Exam  General:  well-nourished,in no acute distress; alert,appropriate and cooperative throughout examination Eyes:  No corneal or conjunctival inflammation noted. No icterus Mouth:  Oral mucosa and oropharynx without lesions  or exudates.  Teeth in good repair. No pharyngeal erythema.   Neck:  No deformities, masses, or tenderness noted. Lungs:  Normal respiratory effort, chest expands symmetrically. Lungs are clear to auscultation, no crackles or wheezes. Heart:  Normal rate and regular rhythm. S1 and S2 normal without gallop, murmur, click, rub.S4 with slurring Pulses:  R and L carotid,radial,dorsalis pedis and posterior tibial pulses are full and equal bilaterally Extremities:  trace left pedal edema and trace right pedal edema.  Negative Homan's  Neurologic:  alert & oriented X3.   Skin:  Intact without suspicious lesions or rashes Cervical Nodes:  No lymphadenopathy noted Axillary Nodes:  No palpable lymphadenopathy Psych:  Oriented X3, flat affect, and subdued.     Impression & Recommendations:  Problem # 1:  DYSPNEA/SHORTNESS OF BREATH (ICD-786.09)  R/O CAD as etiology Her updated medication list for this problem includes:    Quinapril Hcl 40 Mg Tabs (Quinapril hcl) .[name]... Take one half tablet by mouth daily    Hydrochlorothiazide 25 Mg Tabs (Hydrochlorothiazide) .[name]... 1/2 tab qd  Orders: EKG w/ Interpretation (93000) Cardiology Referral (Cardiology)  Problem # 2:  GERD (ICD-530.81)  with globus sensation; Aleve a trigger  Orders: EKG w/ Interpretation (93000) Prescription Created Electronically [phone])  Her updated medication list for this problem includes:    Ranitidine Hcl 150 Mg Tabs (Ranitidine hcl) .[name]... 1 two times a day pre meals  Problem # 3:  CORONARY ARTERY DISEASE, FAMILY HX (ICD-V17.3)  Father  & bro with CAD  Orders: Cardiology Referral (Cardiology)  Problem # 4:  FATIGUE (ICD-780.79)  Orders: EKG w/ Interpretation (93000)  Complete Medication List: 1)  Quinapril Hcl 40 Mg Tabs (Quinapril hcl) .... Take one half tablet by mouth daily 2)  Hydrochlorothiazide 25 Mg Tabs (Hydrochlorothiazide) .... 1/2 tab qd 3)  Centrum Silver Tabs (Multiple vitamins-minerals) .... One  tablet by mouth once daily 4)  Vitamin D3 2000 Unit Caps (Cholecalciferol) .[name].. 1 by mouth once daily 5)  Vitamin E Crea (Vitamin e) .... One by mouth once daily 6)  Vitamin C 1000 Mg Tabs (Ascorbic acid) .... One by mouth once daily 7)  Adult Aspirin Ec Low Strength 81 Mg Tbec (Aspirin) .... One by mouth once daily 8)  Calcium 600 Mg Tabs (Calcium) .[name].. 1 by mouth once daily 9)  Lyrica 100 Mg Caps (Pregabalin) .[name].. 1 by mouth qhs 10)  Fish Oil 1000 Mg Caps (Omega-3 fatty acids) .... One by mouth once daily 11)  Amlodipine Besylate 5 Mg Tabs (Amlodipine besylate) .[name].. 1 once daily inplace of verapamil 12)  Alprazolam 0.25 Mg Tabs (Alprazolam) .... 1/2 -1 q 8 -12 hrs prn 13)  Meclizine Hcl 25 Mg Tabs (Meclizine hcl) .... 1/2 to 1 by mouth once daily 14)  Glucosamine Sulfate 500 Mg Tabs (Glucosamine sulfate) .... 3 by mouth once daily (3 months on, 2 months off) 15)  Benzonatate 200 Mg Caps (Benzonatate) .[name].. 1 q 8 hrs as needed cough 16)  Azithromycin 250 Mg Tabs (Azithromycin) .... As per pack 17)  Doxycycline Hyclate 100 Mg Caps (Doxycycline hyclate) .[name].. 1bid x 2 days then 1 once daily 18)  Lyrica 25 Mg Caps (Pregabalin) .... Take 1 tab at breakfast and at lunch 19)  Ranitidine Hcl 150 Mg Tabs (Ranitidine hcl) .[name].. 1 two times a day pre meals  Patient Instructions: 1)  Stop Pepcid ; take Ranitidine 150 mg  two times a day . 2)  Avoid foods high in acid (tomatoes, citrus juices, spicy foods). Avoid eating within two hours of lying down or before exercising. Do not over eat; try smaller more frequent meals. Elevate head of bed twelve inches when sleeping. Stop Aleve ; try AS Tylenol as needed or Zostrix cream topically as needed for hip pain Prescriptions: RANITIDINE HCL 150 MG TABS (RANITIDINE HCL) 1 two times a day pre meals  #60 x 1   Entered and Authorized by:   [name] MD   Signed by:   [name] MD on 08/12/2009   Method used:   Faxed to ...       [facility]  [place] #  [id number] (retail)       [address]       [place], [place]  [address]       Ph: [phone] or [phone]       Fax: [phone]   RxID:   [id number]

## 2010-05-05 NOTE — Assessment & Plan Note (Signed)
Summary: 6wk f./u  Medications Added LYRICA 100 MG CAPS (PREGABALIN) take one tablet at bedtime      Allergies Added:   Primary Provider:  [name] MD  CC:  6 week follow up.  Pt feeling better since iron and b12 shots but is still reporting some fatigue.  She says with short walks she is feeling tired.Krystal Henderson  History of Present Illness: 77 y/o woman with h/o HTN and osteoarthritis recently referred by Dr. Linna Darner for further evalaution of chronic dyspnea.  ECHO which showed EF 55% with mild AI and MR and restrictive diastolic filling pattern with very high LV filling pressures (E/E' > 25)  Myoview EF 65% with question of trivial anterior ischemia.  Also had chest CT. No evidence of PE, interstitial prominence interpreted as suggestive for pulmonary HTN.  At last visit we started low-dose spironolactone and sasys he feels better but still very short of breath. Gets winded just walking from her car to front door. No CP.   Was anemic and since we last saw her had EGD and colonscopy which were normal. Also started on iron and got B12 shot.    Current Medications (verified): 1)  Quinapril Hcl 40 Mg  Tabs (Quinapril Hcl) .... Take One Half Tablet By Mouth Daily 2)  Centrum Silver   Tabs (Multiple Vitamins-Minerals) .... One Tablet By Mouth Once Daily 3)  Vitamin D3 2000 Unit Caps (Cholecalciferol) .Krystal Henderson.. 1 By Mouth Once Daily 4)  Vitamin C 1000 Mg  Tabs (Ascorbic Acid) .... One By Mouth Once Daily 5)  Adult Aspirin Ec Low Strength 81 Mg  Tbec (Aspirin) .... One By Mouth Once Daily 6)  Calcium 600 Mg Tabs (Calcium) .Krystal Henderson.. 1 By Mouth Once Daily 7)  Lyrica 100 Mg  Caps (Pregabalin) .Krystal Henderson.. 1 By Mouth Qhs 8)  Fish Oil 1000 Mg  Caps (Omega-3 Fatty Acids) .... One By Mouth Once Daily 9)  Amlodipine Besylate 5 Mg  Tabs (Amlodipine Besylate) .Krystal Henderson.. 1 Once Daily Inplace of Verapamil 10)  Alprazolam 0.25 Mg Tabs (Alprazolam) .... 1/2 -1 Q 8 -12 Hrs Prn 11)  Meclizine Hcl 25 Mg Tabs (Meclizine Hcl) .... 1/2  To 1 By Mouth Once Daily 12)  Glucosamine Sulfate 500 Mg Tabs (Glucosamine Sulfate) .... 3 By Mouth Once Daily (3 Months On, 2 Months Off) 13)  Lyrica 25 Mg Caps (Pregabalin) .... Take 1 Tab At Breakfast and At Lunch 14)  Furosemide 20 Mg Tabs (Furosemide) .... Take One Tablet By Mouth  On Monday, Wednesday and Friday 15)  Protonix 40 Mg Tbec (Pantoprazole Sodium) .... Take One Tablet By Mouth Daily 16)  Cyanocobalamin 1000 Mcg/ml Inj Soln (Cyanocobalamin) .Krystal Henderson.. 1 Cc Im Weekly X3 17)  Tandem 162-115.2 Mg Caps (Ferrous Fum-Iron Polysacch) .Krystal Henderson.. 1 By Mouth Once Daily 18)  Lyrica 100 Mg Caps (Pregabalin) .... Take One Tablet At Bedtime  Allergies (verified): 1)  ! Codeine  Past History:  Past Medical History: Last updated: 09/19/2009 1. Dypnea 2. HTN 3. Hyperlipidemia 4. GERD 5. Fatigue 6. Anxiety 7. Hypoglycemia 8. Peripheral Neuropathy 9. Colonic polyps 10. Diverticulosis 11. Arthritis 12. Osteoporosis 13. Pelvic pain  Vital Signs:  Patient profile:   77 year old female Height:      66 inches Weight:      176 pounds BMI:     28.51 Pulse rate:   73 / minute Pulse rhythm:   regular BP sitting:   132 / 74  (left arm) Cuff size:   regular  Vitals Entered By:  Doug Sou CMA (October 29, 2009 3:55 PM)  Physical Exam  General:  Elderly frail  appearing. no resp difficulty HEENT: normal Neck: supple. no JVD. Carotids 2+ bilat; no bruits. No lymphadenopathy or thryomegaly appreciated. Cor: PMI nondisplaced. Regular rate & rhythm. No rubs, murmur. +s4 Lungs: clear Abdomen: soft, nontender, nondistended. No hepatosplenomegaly. No bruits or masses. Good bowel sounds. Extremities: no cyanosis, clubbing, rash, edema Neuro: alert & orientedx3, cranial nerves grossly intact. moves all 4 extremities w/o difficulty. affect flattened    Impression & Recommendations:  Problem # 1:  SHORTNESS OF BREATH (ICD-786.05) I suspect most of her symptoms are realted to severe diastolic  dysfunction and pulmonary venous HTN. She is mildly better with diuresis but still quite symptomatic. I feel strongly that she needs R and L heart cath to further evalauate and guide management. We discussed the risks and indications and she agrees to proceed.   Other Orders: TLB-BMP (Basic Metabolic Panel-BMET) (99991111) TLB-CBC Platelet - w/Differential (85025-CBCD) TLB-PTT (85730-PTTL) TLB-PT (Protime) (85610-PTP)  Patient Instructions: 1)  Your physician recommends that you schedule a follow-up appointment after the cath 2)  Your physician recommends that you have lab work today 3)  Your physician recommends that you continue on your current medications as directed. Please refer to the Current Medication list given to you today. 4)  Your physician has requested that you have a cardiac catheterization.  Cardiac catheterization is used to diagnose and/or treat various heart conditions. Doctors may recommend this procedure for a number of different reasons. The most common reason is to evaluate chest pain. Chest pain can be a symptom of coronary artery disease (CAD), and cardiac catheterization can show whether plaque is narrowing or blocking your heart's arteries. This procedure is also used to evaluate the valves, as well as measure the blood flow and oxygen levels in different parts of your heart.  For further information please visit [email or web address].  Please follow instruction sheet, as given.

## 2010-05-05 NOTE — Progress Notes (Signed)
Summary: Schedule OV to discuss Colonoscopy   Phone Note Outgoing Call Call back at [facility] Phone [phone]   Call placed by: Bernita Buffy CMA Deborra Medina),  Aug 06, 2009 9:03 AM Call placed to: Patient Summary of Call: pt due for colonoscopy but per Dr Sharlett Iles he would like an office visit first. I spoke to pt and she would like to call back at a later time.  Initial call taken by: Bernita Buffy CMA Deborra Medina),  Aug 06, 2009 9:03 AM     Appended Document: Schedule OV to discuss Colonoscopy pt. returned your call from 08-06-09 and sch'd a REV for 09-30-09 @ 3:15 to discuss scheduling a colonoscopy.

## 2010-05-05 NOTE — Letter (Signed)
Summary: [facility] Instructions  [facility]  [place], [place] [address]   Phone: [phone]  Fax: [phone]       KSENIA COBAS    [birth year]    MRN: [id number]        Procedure Day /Date: Friday, 10/10/09     Arrival Time: 1:30      Procedure Time: 2:30     Location of Procedure:                    [facility]  [place] (4th Floor)                        [place]   Starting 5 days prior to your procedure 10/05/09 do not eat nuts, seeds, popcorn, corn, beans, peas,  salads, or any raw vegetables.  Do not take any fiber supplements (e.g. Metamucil, Citrucel, and Benefiber).  THE DAY BEFORE YOUR PROCEDURE         DATE: 10/09/09    DAY: Thursday  1.  Drink clear liquids the entire day-NO SOLID FOOD  2.  Do not drink anything colored red or purple.  Avoid juices with pulp.  No orange juice.  3.  Drink at least 64 oz. (8 glasses) of fluid/clear liquids during the day to prevent dehydration and help the prep work efficiently.  CLEAR LIQUIDS INCLUDE: Water Jello Ice Popsicles Tea (sugar ok, no milk/cream) Powdered fruit flavored drinks Coffee (sugar ok, no milk/cream) Gatorade Juice: apple, white grape, white cranberry  Lemonade Clear bullion, consomm, broth Carbonated beverages (any kind) Strained chicken noodle soup Hard Candy                             4.  In the morning, mix first dose of MoviPrep solution:    Empty 1 Pouch A and 1 Pouch B into the disposable container    Add lukewarm drinking water to the top line of the container. Mix to dissolve    Refrigerate (mixed solution should be used within 24 hrs)  5.  Begin drinking the prep at 5:00 p.m. The MoviPrep container is divided by 4 marks.   Every 15 minutes drink the solution down to the next mark (approximately 8 oz) until the full liter is complete.   6.  Follow completed prep with 16 oz of clear liquid of your choice (Nothing red  or purple).  Continue to drink clear liquids until bedtime.  7.  Before going to bed, mix second dose of MoviPrep solution:    Empty 1 Pouch A and 1 Pouch B into the disposable container    Add lukewarm drinking water to the top line of the container. Mix to dissolve    Refrigerate  THE DAY OF YOUR PROCEDURE      DATE: 10/10/09    DAY: Friday  Beginning at 9:30 a.m. (5 hours before procedure):         1. Every 15 minutes, drink the solution down to the next mark (approx 8 oz) until the full liter is complete.  2. Follow completed prep with 16 oz. of clear liquid of your choice.    3. You may drink clear liquids until 12:30  (2 HOURS BEFORE PROCEDURE).   MEDICATION INSTRUCTIONS  Unless otherwise instructed, you should take regular prescription medications with a small sip of water   as early as  possible the morning of your procedure.                    OTHER INSTRUCTIONS  You will need a responsible adult at least 75 years of age to accompany you and drive you home.   This person must remain in the waiting room during your procedure.  Wear loose fitting clothing that is easily removed.  Leave jewelry and other valuables at home.  However, you may wish to bring a book to read or  an iPod/MP3 player to listen to music as you wait for your procedure to start.  Remove all body piercing jewelry and leave at home.  Total time from sign-in until discharge is approximately 2-3 hours.  You should go home directly after your procedure and rest.  You can resume normal activities the  day after your procedure.  The day of your procedure you should not:   Drive   Make legal decisions   Operate machinery   Drink alcohol   Return to work  You will receive specific instructions about eating, activities and medications before you leave.    The above instructions have been reviewed and explained to me by   _______________________    I fully understand and can  verbalize these instructions _____________________________ Date _________

## 2010-05-05 NOTE — Assessment & Plan Note (Signed)
Summary: MONTHLY B12 SHOT...LSW.  Nurse Visit   Allergies: 1)  ! Codeine  Medication Administration  Injection # 1:    Medication: Vit B12 1000 mcg    Diagnosis: B12 DEFICIENCY (ICD-266.2)    Route: IM    Site: L deltoid    Exp Date: 11/2011    Lot #: [id number]    Mfr: [place]    Patient tolerated injection without complications    Given by: Randye Lobo NCMA (February 16, 2010 1:38 PM)  Orders Added: 1)  Vit B12 1000 mcg [id number]

## 2010-05-05 NOTE — Letter (Signed)
Summary: [facility]  [facility]   Imported By: Edmonia James 02/19/2010 12:01:49  _____________________________________________________________________  External Attachment:    Type:   Image     Comment:   External Document

## 2010-05-05 NOTE — Procedures (Signed)
Summary: Colonoscopy  Patient: Krystal Henderson Note: All result statuses are Final unless otherwise noted.  Tests: (1) Colonoscopy (COL)   COL Colonoscopy           [facility].     [place], [place]  [address]           COLONOSCOPY PROCEDURE REPORT           PATIENT:  Krystal, Henderson  MR#:  [id number]     BIRTHDATE:  12-30-1932, 77 yrs. old  GENDER:  female     ENDOSCOPIST:  Loralee Pacas. Sharlett Iles, MD, [facility]     REF. BY:     PROCEDURE DATE:  10/10/2009     PROCEDURE:  Surveillance Colonoscopy     ASA CLASS:  Class II     INDICATIONS:  history of pre-cancerous (adenomatous) colon polyps           MEDICATIONS:   Fentanyl 50 mcg IV, Versed 7 mg IV           DESCRIPTION OF PROCEDURE:   After the risks benefits and     alternatives of the procedure were thoroughly explained, informed     consent was obtained.  Digital rectal exam was performed and     revealed no abnormalities.   The LB [id number] endoscope     was introduced through the anus and advanced to the cecum, which     was identified by both the appendix and ileocecal valve, without     limitations.  The quality of the prep was excellent, using     MoviPrep.  The instrument was then slowly withdrawn as the colon     was fully examined.     <<PROCEDUREIMAGES>>           FINDINGS:  Scattered diverticula were found found scattered     throught the colon.   Retroflexed views in the rectum revealed no     abnormalities.    The scope was then withdrawn from the patient     and the procedure completed.           COMPLICATIONS:  None     ENDOSCOPIC IMPRESSION:     1) Diverticula, scattered found scattered throught the colon     2) No polyps or cancers     3) Normal colonoscopy otherwise     RECOMMENDATIONS:     1) high fiber diet     2) Continue current colorectal screening recommendations for     "routine risk" patients with a repeat colonoscopy in 10 years.     REPEAT EXAM:  No         ______________________________     Loralee Pacas. Sharlett Iles, MD, [facility]           CC:  [name], MD           n.     Lorrin MaisMarland Kitchen   Loralee Pacas. Khushboo Chuck at 10/10/2009 03:29 PM           [name], [id number]  Note: An exclamation mark (!) indicates a result that was not dispersed into the flowsheet. Document Creation Date: 10/10/2009 3:30 PM _______________________________________________________________________  (1) Order result status: Final Collection or observation date-time: 10/10/2009 15:16 Requested date-time:  Receipt date-time:  Reported date-time:  Referring Physician:   Ordering Physician: Verl Blalock [phone]) Specimen Source:  Source: Tawanna Cooler Order Number: [id number] Lab site:   Appended Document:  Colonoscopy    Clinical Lists Changes  Observations: Added new observation of COLONNXTDUE: 10/2019 (10/10/2009 16:16)

## 2010-05-05 NOTE — Letter (Signed)
Summary: [facility]  [facility]   Imported By: Edmonia James 07/28/2009 13:06:22  _____________________________________________________________________  External Attachment:    Type:   Image     Comment:   External Document

## 2010-05-05 NOTE — Miscellaneous (Signed)
Summary: changed tandem  Clinical Lists Changes  Medications: Changed medication from TANDEM 162-115.2 MG CAPS (FERROUS FUM-IRON POLYSACCH) 1 by mouth once daily to SE-TAN PLUS 162-115.2-1 MG CAPS (FEFUM-FEPO-FA-B CMP-C-ZN-MN-CU) take one by mouth once daily - Signed Rx of SE-TAN PLUS 162-115.2-1 MG CAPS (FEFUM-FEPO-FA-B CMP-C-ZN-MN-CU) take one by mouth once daily;  #30 x 6;  Signed;  Entered by: Bernita Buffy CMA (AAMA);  Authorized by: Sable Feil MD [facility];  Method used: Electronically to [facility]. # [id number]*, [address] [place], [place], [place]  [address], Ph: [phone] or [phone], Fax: [phone]    Prescriptions: SE-TAN PLUS 162-115.2-1 MG CAPS (FEFUM-FEPO-FA-B CMP-C-ZN-MN-CU) take one by mouth once daily  #30 x 6   Entered by:   Bernita Buffy CMA ([place])   Authorized by:   Sable Feil MD [facility]   Signed by:   Bernita Buffy CMA ([place]) on 12/31/2009   Method used:   Electronically to        [facility]. # [id number]* (retail)       [place]       [place],   [address]       Ph: [phone] or [phone]       Fax: [phone]   RxID:   [id number]

## 2010-05-05 NOTE — Assessment & Plan Note (Signed)
Summary: 1 MONTH B12 SHOT  Nurse Visit   Allergies: 1)  ! Codeine  Medication Administration  Injection # 1:    Medication: Vit B12 1000 mcg    Diagnosis: B12 DEFICIENCY (ICD-266.2)    Route: IM    Site: L deltoid    Exp Date: 7/13    Lot #: [id number]    Mfr: [place]    Patient tolerated injection without complications    Given by: Madlyn Frankel CMA ([place]) (December 19, 2009 10:51 AM)  Orders Added: 1)  Vit B12 1000 mcg [J3420]   Medication Administration  Injection # 1:    Medication: Vit B12 1000 mcg    Diagnosis: B12 DEFICIENCY (ICD-266.2)    Route: IM    Site: L deltoid    Exp Date: 7/13    Lot #: [id number]    Mfr: [place]ent    Patient tolerated injection without complications    Given by: Madlyn Frankel CMA ([place]) (December 19, 2009 10:51 AM)  Orders Added: 1)  Vit B12 1000 mcg [id number]

## 2010-05-05 NOTE — Assessment & Plan Note (Signed)
Summary: BAD COLD/RH........[name]   Vital Signs:  Patient profile:   77 year old female Weight:      178 pounds Temp:     98.3 degrees F oral Pulse rate:   72 / minute Resp:     15 per minute BP sitting:   100 / 60  (left arm) Cuff size:   large  Vitals Entered By: Georgette Dover (June 23, 2009 2:29 PM) CC: Bad Cold: Cough (productive) Comments REVIEWED MED LIST, PATIENT AGREED DOSE AND INSTRUCTION CORRECT    Primary Care Provider:  [name] MD  CC:  Bad Cold: Cough (productive).  History of Present Illness: Onset as severe ST @ 3 am 06/20/2008 followed by cough with white phlegm. Rx: Mucinex , Robitussin OTC. Flu shot in Fall 2010. Never smoked; no PMH of asthma  Allergies: 1)  ! Codeine  Review of Systems General:  Complains of chills; denies fever and sweats. ENT:  Denies ear discharge, earache, nasal congestion, and sinus pressure; No frontal headaches , facial pain or purulence. Resp:  Denies chest pain with inspiration, coughing up blood, shortness of breath, and wheezing.  Physical Exam  General:  Appears younger than age,in no acute distress; alert,appropriate and cooperative throughout examination Ears:  External ear exam shows no significant lesions or deformities.  Otoscopic examination reveals clear canals, tympanic membranes are intact bilaterally without bulging, retraction, inflammation or discharge. Hearing is grossly normal bilaterally. Nose:  External nasal examination shows no deformity or inflammation. Nasal mucosa are pink and moist without lesions or exudates. Mouth:  Oral mucosa and oropharynx without lesions or exudates.   No pharyngeal erythema.  Hoarse Lungs:  Normal respiratory effort, chest expands symmetrically. Lungs are clear to auscultation, no crackles or wheezes but rattly cough. Cervical Nodes:  No lymphadenopathy noted Axillary Nodes:  No palpable lymphadenopathy   Impression & Recommendations:  Problem # 1:  BRONCHITIS-ACUTE  (ICD-466.0)  Her updated medication list for this problem includes:    Benzonatate 200 Mg Caps (Benzonatate) .[name]... 1 q 8 hrs as needed cough    Azithromycin 250 Mg Tabs (Azithromycin) .[name]... As per pack  Orders: Prescription Created Electronically [phone])  Complete Medication List: 1)  Quinapril Hcl 40 Mg Tabs (Quinapril hcl) .... Take one half tablet by mouth daily 2)  Hydrochlorothiazide 25 Mg Tabs (Hydrochlorothiazide) .... 1/2 tab qd 3)  Centrum Silver Tabs (Multiple vitamins-minerals) .... One tablet by mouth once daily 4)  Vitamin D3 2000 Unit Caps (Cholecalciferol) .[name].. 1 by mouth once daily 5)  Vitamin E Crea (Vitamin e) .... One by mouth once daily 6)  Vitamin C 1000 Mg Tabs (Ascorbic acid) .... One by mouth once daily 7)  Adult Aspirin Ec Low Strength 81 Mg Tbec (Aspirin) .... One by mouth once daily 8)  Calcium 600 Mg Tabs (Calcium) .[name].. 1 by mouth once daily 9)  Lyrica 100 Mg Caps (Pregabalin) .[name].. 1 by mouth qhs 10)  Fish Oil 1000 Mg Caps (Omega-3 fatty acids) .... One by mouth once daily 11)  Amlodipine Besylate 5 Mg Tabs (Amlodipine besylate) .[name].. 1 once daily inplace of verapamil 12)  Alprazolam 0.25 Mg Tabs (Alprazolam) .... 1/2 -1 q 8 -12 hrs prn 13)  Meclizine Hcl 25 Mg Tabs (Meclizine hcl) .... 1/2 to 1 by mouth once daily 14)  Glucosamine Sulfate 500 Mg Tabs (Glucosamine sulfate) .... 3 by mouth once daily (3 months on, 2 months off) 15)  Benzonatate 200 Mg Caps (Benzonatate) .[name].. 1 q 8 hrs as needed cough  16)  Azithromycin 250 Mg Tabs (Azithromycin) .... As per pack  Patient Instructions: 1)  Drink as much fluid as you can tolerate for the next few days. Prescriptions: AZITHROMYCIN 250 MG TABS (AZITHROMYCIN) as per pack  #1 x 0   Entered and Authorized by:   [name] MD   Signed by:   [name] MD on 06/23/2009   Method used:   Faxed to ...       [facility]  [place] # [id number]* (retail)       [place]       [place], [place]  [address]       Ph: [phone] or [phone]       Fax: [phone]   RxID:   [id number] BENZONATATE 200 MG CAPS (BENZONATATE) 1 q 8 hrs as needed cough  #20 x 0   Entered and Authorized by:   [name] MD   Signed by:   [name] MD on 06/23/2009   Method used:   Faxed to ...       [facility]  [place] # [id number]* (retail)       [address]       [place], [place]  [address]       Ph: [phone] or [phone]       Fax: [phone]   RxID:   [id number]

## 2010-05-05 NOTE — Assessment & Plan Note (Signed)
Summary: f/u@ 9:15 pft 09-10-09/echo/myoview 09-19-09/sl  Medications Added HYDROCHLOROTHIAZIDE 25 MG TABS (HYDROCHLOROTHIAZIDE) HOLD FUROSEMIDE 20 MG TABS (FUROSEMIDE) Take one tablet by mouth  on Monday, Wednesday and Friday PROTONIX 40 MG TBEC (PANTOPRAZOLE SODIUM) Take one tablet by mouth daily      Allergies Added:   Primary Provider:  [name] MD   History of Present Illness: 77 y/o woman with h/o HTN and osteoarthritis recently referred by Dr. Linna Darner for further evalaution of chronic dyspnea.  Since we last saw here she had ECHO which showed EF 50-55% with mild AI and MR (no comment on diastolic function). Myoview EF 65% with question of trivial anterior ischemia.  Also had chest CT. No evidence of PE, interstitial prominence interpreted as suggestive for pulmonary HTN.  Here to discuss results. Still gets SOB with just mild exertion. No orthopnea or PND. No edema. No cough or wheeze.    Current Medications (verified): 1)  Quinapril Hcl 40 Mg  Tabs (Quinapril Hcl) .... Take One Half Tablet By Mouth Daily 2)  Hydrochlorothiazide 25 Mg Tabs (Hydrochlorothiazide) .... Hold 3)  Centrum Silver   Tabs (Multiple Vitamins-Minerals) .... One Tablet By Mouth Once Daily 4)  Vitamin D3 2000 Unit Caps (Cholecalciferol) .[name].. 1 By Mouth Once Daily 5)  Vitamin C 1000 Mg  Tabs (Ascorbic Acid) .... One By Mouth Once Daily 6)  Adult Aspirin Ec Low Strength 81 Mg  Tbec (Aspirin) .... One By Mouth Once Daily 7)  Calcium 600 Mg Tabs (Calcium) .[name].. 1 By Mouth Once Daily 8)  Lyrica 100 Mg  Caps (Pregabalin) .[name].. 1 By Mouth Qhs 9)  Fish Oil 1000 Mg  Caps (Omega-3 Fatty Acids) .... One By Mouth Once Daily 10)  Amlodipine Besylate 5 Mg  Tabs (Amlodipine Besylate) .[name].. 1 Once Daily Inplace of Verapamil 11)  Alprazolam 0.25 Mg Tabs (Alprazolam) .... 1/2 -1 Q 8 -12 Hrs Prn 12)  Meclizine Hcl 25 Mg Tabs (Meclizine Hcl) .... 1/2 To 1 By Mouth Once Daily 13)  Glucosamine Sulfate 500 Mg Tabs (Glucosamine  Sulfate) .... 3 By Mouth Once Daily (3 Months On, 2 Months Off) 14)  Lyrica 25 Mg Caps (Pregabalin) .... Take 1 Tab At Breakfast and At Lunch  Allergies (verified): 1)  ! Codeine  Past History:  Past Medical History: 1. Dypnea 2. HTN 3. Hyperlipidemia 4. GERD 5. Fatigue 6. Anxiety 7. Hypoglycemia 8. Peripheral Neuropathy 9. Colonic polyps 10. Diverticulosis 11. Arthritis 12. Osteoporosis 13. Pelvic pain  Review of Systems       As per HPI and past medical history; otherwise all systems negative.   Vital Signs:  Patient profile:   77 year old female Height:      66 inches Weight:      176 pounds BMI:     28.51 Pulse rate:   70 / minute Resp:     18 per minute BP sitting:   128 / 72  (right arm)  Vitals Entered By: Levora Angel, CNA (September 19, 2009 9:23 AM)  Physical Exam  General:  Elderly frail  appearing. no resp difficulty HEENT: normal Neck: supple. no JVD. Carotids 2+ bilat; no bruits. No lymphadenopathy or thryomegaly appreciated. Cor: PMI nondisplaced. Regular rate & rhythm. No rubs, murmur. +s4 Lungs: clear Abdomen: soft, nontender, nondistended. No hepatosplenomegaly. No bruits or masses. Good bowel sounds. Extremities: no cyanosis, clubbing, rash, edema Neuro: ? mild speech latency.  alert & orientedx3, cranial nerves grossly intact. moves all 4 extremities w/o difficulty. affect flattened  Impression & Recommendations:  Problem # 1:  DYSPNEA/SHORTNESS OF BREATH (ICD-786.09) I have reviewed all her tests with her and her husband at length. I suspect Myoview defect is likely related to breast attenuation and not frank ischemia. On the other hand, her echo results are much more concerning. Her EF is normal (55-60% based on my interpretation) but her diastolic filling pattern is most c/w restrictive physiology with very high LV filling pressures (E/E' > 25) and I think this is likely the cause of her severe dypsnea and pulmonary venous HTN. We will try  low-dose diuretic but given previous renal insufficiency I doubt she will tolerate enough diuresis to truly mak a difference in her symptoms. I suspect we will need to follow up with R and L heart cath to fully define scope of the problem and exclude obstructive coronary disease.   Patient Instructions: 1)  Your physician recommends that you schedule a follow-up appointment in: 4-5 weeks 2)  Your physician recommends that you return for lab work in: 2 weeks--BMP,BNP 786.05 3)  Your physician has recommended you make the following change in your medication: Start furosemide 20 mg by mouth daily on Monday, Wednesday and Friday. 4)  Start Protonix 40 mg by mouth daily. Prescriptions: PROTONIX 40 MG TBEC (PANTOPRAZOLE SODIUM) Take one tablet by mouth daily  #30 x 6   Entered by:   Thompson Grayer, RN, BSN   Authorized by:   Jolaine Artist, MD, [facility]   Signed by:   Thompson Grayer, RN, BSN on 09/19/2009   Method used:   Electronically to        [facility]. # [id number]* (retail)       [place]       [place], [place]  [address]       Ph: [phone] or [phone]       Fax: [phone]   RxID:   [id number] FUROSEMIDE 20 MG TABS (FUROSEMIDE) Take one tablet by mouth  on Monday, Wednesday and Friday  #15 x 6   Entered by:   Thompson Grayer, RN, BSN   Authorized by:   Jolaine Artist, MD, [facility]   Signed by:   Thompson Grayer, RN, BSN on 09/19/2009   Method used:   Electronically to        [facility]. # [id number]* (retail)       [place]       [place], [place]  [address]       Ph: [phone] or [phone]       Fax: [phone]   RxID:   [id number]

## 2010-05-05 NOTE — Procedures (Signed)
Summary: Upper Endoscopy  Patient: Krystal Henderson Note: All result statuses are Final unless otherwise noted.  Tests: (1) Upper Endoscopy (EGD)   EGD Upper Endoscopy       [facility].     [place], [place]  [address]           ENDOSCOPY PROCEDURE REPORT           PATIENT:  Krystal Henderson, Krystal Henderson  MR#:  [id number]     BIRTHDATE:  11/11/1932, 77 yrs. old  GENDER:  female           ENDOSCOPIST:  Loralee Pacas. Sharlett Iles, MD, [facility]     Referred by:           PROCEDURE DATE:  10/10/2009     PROCEDURE:  EGD with biopsy     ASA CLASS:  Class II     INDICATIONS:  GERD dyspnea.?? reflux related.           MEDICATIONS:   There was residual sedation effect present from     prior procedure., Versed 1 mg IV     TOPICAL ANESTHETIC:           DESCRIPTION OF PROCEDURE:   After the risks benefits and     alternatives of the procedure were thoroughly explained, informed     consent was obtained.  The LB [id number]-H180 [id number] endoscope was     introduced through the mouth and advanced to the second portion of     the duodenum, without limitations.  The instrument was slowly     withdrawn as the mucosa was fully examined.     <<PROCEDUREIMAGES>>           Normal GE junction was noted.  Normal duodenal folds were noted.     The stomach was entered and closely examined. The antrum,     angularis, and lesser curvature were well visualized, including a     retroflexed view of the cardia and fundus. The stomach wall was     normally distensable. The scope passed easily through the pylorus     into the duodenum. clo bx. done.  The esophagus and     gastroesophageal junction were completely normal in appearance. NO     STRICTURE NOTED.    Retroflexed views revealed no abnormalities.     The scope was then withdrawn from the patient and the procedure     completed.           COMPLICATIONS:  None           ENDOSCOPIC IMPRESSION:     1) Normal GE junction     2) Normal duodenal  folds     3) Normal stomach     4) Normal esophagus     ??? NONEROSIVE GERD.     RECOMMENDATIONS:     1) Await biopsy results     2) continue PPI           REPEAT EXAM:  No           ______________________________     Loralee Pacas. Sharlett Iles, MD, [facility]           CC:  [name], MD           n.     Lorrin MaisMarland Kitchen   Loralee Pacas. Patterson at 10/10/2009 03:39 PM           [name], [id number]  Note: An exclamation mark (!) indicates a result that was not dispersed into the flowsheet. Document Creation Date: 10/10/2009 3:41 PM _______________________________________________________________________  (1) Order result status: Final Collection or observation date-time: 10/10/2009 15:26 Requested date-time:  Receipt date-time:  Reported date-time:  Referring Physician:   Ordering Physician: Verl Blalock [phone]) Specimen Source:  Source: Tawanna Cooler Order Number: [id number] Lab site:

## 2010-05-05 NOTE — Assessment & Plan Note (Signed)
Summary: eph/ok per gesila      Allergies Added:   Visit Type:  Follow-up Primary Provider:  [name] MD  CC:  shortness of breath but getting better.  History of Present Illness: 77 y/o woman with h/o HTN and osteoarthritis  referred by Dr. Linna Darner for evalaution of chronic dyspnea.  ECHO which showed EF 55% with mild AI and MR and restrictive diastolic filling pattern with very high LV filling pressures (E/E' > 25)  Myoview EF 65% with question of trivial anterior ischemia.  Also had chest CT. No evidence of PE, interstitial prominence interpreted as suggestive for pulmonary HTN. After the ech we started sprinolactone.  Underwent cath this month (August 2011). Normal cors. normal LV function. Mildly dilated Ao root. RA 2 RV 25/0, PA 23/7 (15), PCWP 5. Central aortic pressure was 145/69 with a mean of 99.  LV pressure 145/12 with an LVEDP of 10-15.  Fick cardiac output was 5.7, cardiac index 3.0.  Pulmonary vascular resistance was 1.7 Woods units.  Femoral artery saturation was 94% on room air.  Pulmonary artery saturation was 62% and 68%.  High SVC saturation was 63%.   Returns for post-cath f/u. Breathing continues to improve slowly. No problem with cath site. Main complaint is nueropathic burning in feet. Only mild relief with Lyrica. + hip pain.     Current Medications (verified): 1)  Quinapril Hcl 40 Mg  Tabs (Quinapril Hcl) .... Take One Half Tablet By Mouth Daily 2)  Centrum Silver   Tabs (Multiple Vitamins-Minerals) .... One Tablet By Mouth Once Daily 3)  Vitamin D3 2000 Unit Caps (Cholecalciferol) .[name].. 1 By Mouth Once Daily 4)  Vitamin C 1000 Mg  Tabs (Ascorbic Acid) .... One By Mouth Once Daily 5)  Adult Aspirin Ec Low Strength 81 Mg  Tbec (Aspirin) .... One By Mouth Once Daily 6)  Calcium 600 Mg Tabs (Calcium) .[name].. 1 By Mouth Once Daily 7)  Lyrica 100 Mg  Caps (Pregabalin) .[name].. 1 By Mouth Qhs 8)  Fish Oil 1000 Mg  Caps (Omega-3 Fatty Acids) .... One By Mouth Once  Daily 9)  Amlodipine Besylate 5 Mg  Tabs (Amlodipine Besylate) .[name].. 1 Once Daily Inplace of Verapamil 10)  Alprazolam 0.25 Mg Tabs (Alprazolam) .... 1/2 -1 Q 8 -12 Hrs Prn 11)  Meclizine Hcl 25 Mg Tabs (Meclizine Hcl) .... 1/2 To 1 By Mouth Once Daily 12)  Glucosamine Sulfate 500 Mg Tabs (Glucosamine Sulfate) .... 3 By Mouth Once Daily (3 Months On, 2 Months Off) 13)  Lyrica 25 Mg Caps (Pregabalin) .... Take 1 Tab At Breakfast and At Lunch 14)  Furosemide 20 Mg Tabs (Furosemide) .... Take One Tablet By Mouth  On Monday, Wednesday and Friday 15)  Protonix 40 Mg Tbec (Pantoprazole Sodium) .... Take One Tablet By Mouth Daily 16)  Cyanocobalamin 1000 Mcg/ml Inj Soln (Cyanocobalamin) .[name].. 1 Cc Im Weekly X3 17)  Tandem 162-115.2 Mg Caps (Ferrous Fum-Iron Polysacch) .[name].. 1 By Mouth Once Daily 18)  Lyrica 100 Mg Caps (Pregabalin) .... Take One Tablet At Bedtime  Allergies (verified): 1)  ! Codeine  Review of Systems       As per HPI and past medical history; otherwise all systems negative.   Vital Signs:  Patient profile:   77 year old female Height:      66 inches Weight:      176 pounds BMI:     28.51 Pulse rate:   80 / minute BP sitting:   114 / 60  (  left arm) Cuff size:   regular  Vitals Entered By: Mignon Pine, RMA (November 17, 2009 1:44 PM)  Physical Exam  General:  Elderly frail  appearing. no resp difficulty HEENT: normal Neck: supple. no JVD. Carotids 2+ bilat; no bruits. No lymphadenopathy or thryomegaly appreciated. Cor: PMI nondisplaced. Regular rate & rhythm. No rubs, murmur. +s4 Lungs: clear Abdomen: soft, nontender, nondistended. No hepatosplenomegaly. No bruits or masses. Good bowel sounds. Extremities: no cyanosis, clubbing, rash, edema. Groin looks ok. Neuro: alert & orientedx3, cranial nerves grossly intact. moves all 4 extremities w/o difficulty. affect flattened    Impression & Recommendations:  Problem # 1:  DYSPNEA/SHORTNESS OF BREATH  (ICD-786.09) Improving slowly. Invasive pressures look great despite abnormal filling patterns on echo. Suspect spironolactone has helped. Encouraged her to continue with water aerobics as I suspect much of her dyspnea may be deconditioning.   Patient Instructions: 1)  Your physician wants you to follow-up in:  6 months.  You will receive a reminder letter in the mail two months in advance. If you don't receive a letter, please call our office to schedule the follow-up appointment.

## 2010-05-05 NOTE — Miscellaneous (Signed)
Summary: Orders Update pft charges  Clinical Lists Changes  Orders: Added new Service order of Carbon Monoxide diffusing w/capacity (94720) - Signed Added new Service order of Lung Volumes (94240) - Signed Added new Service order of Spirometry (Pre & Post) (94060) - Signed 

## 2010-05-05 NOTE — Assessment & Plan Note (Signed)
Summary: f/u to discuss colon--ch.    History of Present Illness Visit Type: Follow-up Visit Primary GI MD: Verl Blalock MD [facility] Primary Provider: [name] MD Requesting Provider: Marjie Skiff Chief Complaint: Recall colon, patient not having any problems History of Present Illness:   77 year old Caucasian female whose had multiple recurrent colon polyps. She due for followup colonoscopy at this time but denies lower GI complaints.  She receives undergone a rather extensive cardiac evaluation and for dyspnea and shortness of breath and apparently these workups have been unremarkable. In retrospect she does have rather marked chronic acid reflux symptoms with associated hoarseness and perhaps aspiration. She currently is on Protonix 40 mg a day with some improvement in her extra-esophageal symptomatology.She denies dysphasia, anorexia, weight loss, or hepatobiliary complaints. She has not had previous endoscopic exam.  Other complaints today are increased memory difficulties. She also has a chronic peripheral neuropathy and is on Lyrica 25 mg twice a day. She is hypertensive cardiovascular disease and is followed by Dr. [name]. Other problems have included osteoarthritis and osteoporosis. She has had previous right hip replacement surgery and has chronic spine spondylosis. She currently is on the care of Dr. Maureen Ralphs.   GI Review of Systems      Denies abdominal pain, acid reflux, belching, bloating, chest pain, dysphagia with liquids, dysphagia with solids, heartburn, loss of appetite, nausea, vomiting, vomiting blood, weight loss, and  weight gain.        Denies anal fissure, black tarry stools, change in bowel habit, constipation, diarrhea, diverticulosis, fecal incontinence, heme positive stool, hemorrhoids, irritable bowel syndrome, jaundice, light color stool, liver problems, rectal bleeding, and  rectal pain.    Current Medications (verified): 1)  Quinapril  Hcl 40 Mg  Tabs (Quinapril Hcl) .... Take One Half Tablet By Mouth Daily 2)  Hydrochlorothiazide 25 Mg Tabs (Hydrochlorothiazide) .... Hold 3)  Centrum Silver   Tabs (Multiple Vitamins-Minerals) .... One Tablet By Mouth Once Daily 4)  Vitamin D3 2000 Unit Caps (Cholecalciferol) .[name].. 1 By Mouth Once Daily 5)  Vitamin C 1000 Mg  Tabs (Ascorbic Acid) .... One By Mouth Once Daily 6)  Adult Aspirin Ec Low Strength 81 Mg  Tbec (Aspirin) .... One By Mouth Once Daily 7)  Calcium 600 Mg Tabs (Calcium) .[name].. 1 By Mouth Once Daily 8)  Lyrica 100 Mg  Caps (Pregabalin) .[name].. 1 By Mouth Qhs 9)  Fish Oil 1000 Mg  Caps (Omega-3 Fatty Acids) .... One By Mouth Once Daily 10)  Amlodipine Besylate 5 Mg  Tabs (Amlodipine Besylate) .[name].. 1 Once Daily Inplace of Verapamil 11)  Alprazolam 0.25 Mg Tabs (Alprazolam) .... 1/2 -1 Q 8 -12 Hrs Prn 12)  Meclizine Hcl 25 Mg Tabs (Meclizine Hcl) .... 1/2 To 1 By Mouth Once Daily 13)  Glucosamine Sulfate 500 Mg Tabs (Glucosamine Sulfate) .... 3 By Mouth Once Daily (3 Months On, 2 Months Off) 14)  Lyrica 25 Mg Caps (Pregabalin) .... Take 1 Tab At Breakfast and At Lunch 15)  Furosemide 20 Mg Tabs (Furosemide) .... Take One Tablet By Mouth  On Monday, Wednesday and Friday 16)  Protonix 40 Mg Tbec (Pantoprazole Sodium) .... Take One Tablet By Mouth Daily  Allergies (verified): 1)  ! Codeine  Past History:  Past medical, surgical, family and social histories (including risk factors) reviewed for relevance to current acute and chronic problems.  Past Medical History: Reviewed history from 09/19/2009 and no changes required. 1. Dypnea 2. HTN 3. Hyperlipidemia 4. GERD 5. Fatigue  6. Anxiety 7. Hypoglycemia 8. Peripheral Neuropathy 9. Colonic polyps 10. Diverticulosis 11. Arthritis 12. Osteoporosis 13. Pelvic pain  Past Surgical History: Reviewed history from 03/06/2009 and no changes required. Tear duct surgery X 2 Colon polypectomy (multiple polypectomies), Dr  Sharlett Iles Total hip replacement R, Dr Durward Fortes 2000 Plastic surgery, facial Tubal Ligation Hemorrhoid Surgery; G 2 P 2;D&C X1  Family History: Reviewed history from 03/06/2009 and no changes required. No FH of Colon Cancer: Father: MI @ 46 Mother: renal failure Siblings: bro CVA,3 bro & 1 sister lung CA, bro MI@ 47,sister osteoporosis  Social History: Reviewed history from 08/21/2009 and no changes required. Retired Never Smoked Alcohol use-yes:occasionally  -- mostly wine Regular exercise-yes: [employer] (water aerobics) Married  Drug Use - no  Review of Systems       The patient complains of anxiety-new, arthritis/joint pain, back pain, shortness of breath, and voice change.  The patient denies allergy/sinus, anemia, blood in urine, breast changes/lumps, change in vision, confusion, cough, coughing up blood, depression-new, fainting, fatigue, fever, headaches-new, hearing problems, heart murmur, heart rhythm changes, itching, menstrual pain, muscle pains/cramps, night sweats, nosebleeds, pregnancy symptoms, skin rash, sleeping problems, sore throat, swelling of feet/legs, swollen lymph glands, thirst - excessive , urination - excessive , urination changes/pain, urine leakage, and vision changes.    Vital Signs:  Patient profile:   77 year old female Height:      66 inches Weight:      180 pounds BMI:     29.16 Pulse rate:   72 / minute Pulse rhythm:   regular BP sitting:   122 / 66  (left arm) Cuff size:   regular  Vitals Entered By: June McMurray Asher Deborra Medina) (September 30, 2009 3:21 PM)  Physical Exam  General:  Well developed, well nourished, no acute distress.healthy appearing.   Head:  Normocephalic and atraumatic. Eyes:  PERRLA, no icterus.exam deferred to patient's ophthalmologist.   Neck:  Supple; no masses or thyromegaly. Lungs:  Clear throughout to auscultation. Heart:  Regular rate and rhythm; no murmurs, rubs,  or bruits. Abdomen:  Soft, nontender and nondistended. No  masses, hepatosplenomegaly or hernias noted. Normal bowel sounds. Extremities:  No clubbing, cyanosis, edema or deformities noted. Neurologic:  Alert and  oriented x4;  grossly normal neurologically. Cervical Nodes:  No significant cervical adenopathy. Psych:  Alert and cooperative. Normal mood and affect.   Impression & Recommendations:  Problem # 1:  DYSPNEA/SHORTNESS OF BREATH (ICD-786.09) Assessment Improved Probable extra-esophageal manifestation of acid reflux. I have asked continue her Protonix and to use H2 blockers at bedtime as needed. I have reviewed reflux regime with her and scheduled endoscopic exam. I am not sure that she comprehends this problem, his diagnosis or treatment. In fact, I have ordered some lab test and B12 level per what appears to be mild dementia.She is to complete her cardiac workup with cardiology.  Problem # 2:  ANXIETY STATE, UNSPECIFIED (ICD-300.00) Assessment: Unchanged  Problem # 3:  COLONIC POLYPS, ADENOMATOUS, HX OF (ICD-V12.72) Assessment: Unchanged Colonoscopy scheduler her convenience. She is to continue a high-fiber diet as tolerated. Orders: TLB-CBC Platelet - w/Differential (85025-CBCD) TLB-BMP (Basic Metabolic Panel-BMET) (99991111) TLB-Hepatic/Liver Function Pnl (80076-HEPATIC) TLB-TSH (Thyroid Stimulating Hormone) (84443-TSH) TLB-B12, Serum-Total ONLY BY:[id number]) TLB-Ferritin (AB-123456789) TLB-Folic Acid (Folate) (XX123456) TLB-IBC Pnl (Iron/FE;Transferrin) (83550-IBC) TLB-IgA (Immunoglobulin A) (82784-IGA) T-Sprue Panel (Celiac Disease Aby Eval) (83516x3/[address]) Colon/Endo (Colon/Endo)  Problem # 4:  PERIPHERAL NEUROPATHY (ICD-356.9) Assessment: Unchanged B12 level ordered. Consider further neurologic evaluation.  Patient Instructions: 1)  Please go to the basement for lab work. 2)  You are scheduled for an endoscopy and colonoscopy. 3)  The medication list was reviewed and reconciled.  All changed / newly prescribed  medications were explained.  A complete medication list was provided to the patient / caregiver. 4)  Copy sent to : Dr. [name] and Dr. Illene Regulus in cardiology 5)  Please continue current medications.  6)  Avoid foods high in acid content ( tomatoes, citrus juices, spicy foods) . Avoid eating within 3 to 4 hours of lying down or before exercising. Do not over eat; try smaller more frequent meals. Elevate head of bed four inches when sleeping.  7)  Constipation and Hemorrhoids brochure given.  8)  Colonoscopy and Flexible Sigmoidoscopy brochure given.  9)  Conscious Sedation brochure given.  10)  Upper Endoscopy brochure given.  Prescriptions: MOVIPREP 100 GM  SOLR (PEG-KCL-NACL-NASULF-NA ASC-C) As per prep instructions.  #1 x 0   Entered by:   Alberteen Spindle RN   Authorized by:   Sable Feil MD [facility]   Signed by:   Alberteen Spindle RN on 09/30/2009   Method used:   Electronically to        [facility]. # [id number]* (retail)       [address]       [place], [place]  [address]       Ph: [phone] or [phone]       Fax: [phone]   RxID:   [id number]   Appended Document: f/u to discuss colon--ch. B12 AND TANDEM  Appended Document: f/u to discuss colon--ch. Pt notified.  Appt sch fro first B12 inj.   Clinical Lists Changes  Problems: Added new problem of B12 DEFICIENCY (ICD-266.2) Medications: Added new medication of CYANOCOBALAMIN 1000 MCG/ML INJ SOLN (CYANOCOBALAMIN) 1 cc Im weekly X3 Added new medication of TANDEM 162-115.2 MG CAPS (FERROUS FUM-IRON POLYSACCH) 1 by mouth once daily - Signed Rx of TANDEM 162-115.2 MG CAPS (FERROUS FUM-IRON POLYSACCH) 1 by mouth once daily;  #30 x 6;  Signed;  Entered by: Alberteen Spindle RN;  Authorized by: Sable Feil MD [facility];  Method used: Electronically to [facility]. # [id number]*, [address] [place], [place], [place]  [address], Ph: [phone] or [phone], Fax:  [phone]    Prescriptions: TANDEM 162-115.2 MG CAPS (FERROUS FUM-IRON POLYSACCH) 1 by mouth once daily  #30 x 6   Entered by:   Alberteen Spindle RN   Authorized by:   Sable Feil MD [facility]   Signed by:   Alberteen Spindle RN on 10/01/2009   Method used:   Electronically to        [facility]. # [id number]* (retail)       [address]       [place], [place]  [address]       Ph: [phone] or [phone]       Fax: [phone]   RxID:   [id number]

## 2010-05-05 NOTE — Assessment & Plan Note (Signed)
Summary: MONTHLY B12 SHOT...LSW.  Nurse Visit   Allergies: 1)  ! Codeine  Medication Administration  Injection # 1:    Medication: Vit B12 1000 mcg    Diagnosis: B12 DEFICIENCY (ICD-266.2)    Route: IM    Site: R deltoid    Exp Date: 10/2011    Lot #: [id number]    Mfr: American Regent    Patient tolerated injection without complications    Given by: Genella Mech CMA ([place]) (January 19, 2010 11:14 AM)  Orders Added: 1)  Vit B12 1000 mcg [id number]

## 2010-05-05 NOTE — Assessment & Plan Note (Signed)
Summary: #1 of 3 weekly B12/266.2/dfs  Nurse Visit   Allergies: 1)  ! Codeine  Medication Administration  Injection # 1:    Medication: Vit B12 1000 mcg    Diagnosis: B12 DEFICIENCY (ICD-266.2)    Route: IM    Site: L deltoid    Exp Date: 05/2011    Lot #: [id number]    Mfr: American Regent    Comments: pt to schedule #2 of 3 weekly injections at the front desk    Patient tolerated injection without complications    Given by: Christian Mate CMA Deborra Medina) (October 02, 2009 11:34 AM)  Orders Added: 1)  Vit B12 1000 mcg [id number]

## 2010-05-05 NOTE — Assessment & Plan Note (Signed)
Summary: FLU SHOT///SPH   Nurse Visit  CC: Flu shot/Pneumo per request./kb   Allergies: 1)  ! Codeine  Immunizations Administered:  Pneumonia Vaccine:    Vaccine Type: Pneumovax    Site: right deltoid    Mfr: Merck    Dose: 0.5 ml    Route: IM    Given by: Ernestene Mention CMA    Exp. Date: 06/18/2011    Lot #: [id number]    VIS given: 03/10/09 version given December 30, 2009.  Orders Added: 1)  Pneumococcal Vaccine [90732] 2)  Admin 1st Vaccine [90471] 3)  Flu Vaccine 2yrs + MEDICARE PATIENTS [Q2039] 4)  Administration Flu vaccine - MCR [id number]        Flu Vaccine Consent Questions     Do you have a history of severe allergic reactions to this vaccine? no    Any prior history of allergic reactions to egg and/or gelatin? no    Do you have a sensitivity to the preservative Thimersol? no    Do you have a past history of Guillan-Barre Syndrome? no    Do you currently have an acute febrile illness? no    Have you ever had a severe reaction to latex? no    Vaccine information given and explained to patient? yes    Are you currently pregnant? no    Lot Number:[id number]   Exp Date:10/03/2010   Site Given  Left Deltoid IM

## 2010-05-05 NOTE — Consult Note (Signed)
Summary: [facility]   Imported By: Edmonia James 04/22/2009 12:16:45  _____________________________________________________________________  External Attachment:    Type:   Image     Comment:   External Document

## 2010-05-05 NOTE — Assessment & Plan Note (Signed)
Summary: B12 SHOT  Nurse Visit   Allergies: 1)  ! Codeine  Medication Administration  Injection # 1:    Medication: Vit B12 1000 mcg    Diagnosis: B12 DEFICIENCY (ICD-266.2)    Route: IM    Site: L deltoid    Exp Date: 4/13    Lot #: [id number]    Mfr: American Regent    Patient tolerated injection without complications    Given by: Randye Lobo NCMA (October 09, 2009 11:32 AM)  Orders Added: 1)  Vit B12 1000 mcg [id number]

## 2010-05-05 NOTE — Progress Notes (Signed)
Summary: B12  Medications Added NASCOBAL 500 MCG/0.1ML SOLN (CYANOCOBALAMIN) 1 spray weekly       Phone Note Outgoing Call   Summary of Call: Pt wants to try nascobal.  Will take rx to pharmacy find out the cost. Initial call taken by: Alberteen Spindle RN,  October 16, 2009 11:56 AM  Follow-up for Phone Call        Pt will call back if decides to cont withinjections.  Will need inj in one month. Follow-up by: Alberteen Spindle RN,  October 16, 2009 11:57 AM    New/Updated Medications: NASCOBAL 500 MCG/0.1ML SOLN (CYANOCOBALAMIN) 1 spray weekly Prescriptions: NASCOBAL 500 MCG/0.1ML SOLN (CYANOCOBALAMIN) 1 spray weekly  #1 x 6   Entered by:   Alberteen Spindle RN   Authorized by:   Sable Feil MD [facility]   Signed by:   Alberteen Spindle RN on 10/16/2009   Method used:   Print then Give to Patient   RxID:   [id number]

## 2010-05-05 NOTE — Miscellaneous (Signed)
Summary: Orders Update Clotest  Clinical Lists Changes  Orders: Added new Test order of TLB-H Pylori Screen Gastric Biopsy (83013-CLOTEST) - Signed

## 2010-05-05 NOTE — Letter (Signed)
Summary: Cardiac Catheterization Instructions- [place], [place]  [id number] [place]   [place], [place] [address]   Phone: [phone]  Fax: [phone]     10/29/2009 MRN: [id number]  [facility] [place],   [address]  Dear Krystal Henderson,   You are scheduled for a Cardiac Catheterization on 10/31/2009 with Dr. Haroldine Laws  Please arrive to the 1st floor of the Heart and Vascular Center at [facility] at 6:30 am on the day of your procedure. Please do not arrive before 6:30 a.m. Call the Heart and Vascular Center at [phone] if you are unable to make your appointmnet. The Code to get into the parking garage under the building is 1000. Take the elevators to the 1st floor. You must have someone to drive you home. Someone must be with you for the first 24 hours after you arrive home. Please wear clothes that are easy to get on and off and wear slip-on shoes. Do not eat or drink after midnight except water with your medications that morning. Bring all your medications and current insurance cards with you.  ___ DO NOT take these medications before your procedure: Furosemide ___ Make sure you take your aspirin.  The usual length of stay after your procedure is 2 to 3 hours. This can vary.  If you have any questions, please call the office at the number listed above.   Sim Boast, RN

## 2010-05-05 NOTE — Letter (Signed)
Summary: [facility] Office Visit Note   [facility] Office Visit Note   Imported By: Sallee Provencal 10/07/2009 12:52:56  _____________________________________________________________________  External Attachment:    Type:   Image     Comment:   External Document

## 2010-05-07 NOTE — Assessment & Plan Note (Signed)
Summary: MONTHLY B12 SHOT  Nurse Visit   Allergies: 1)  ! Codeine 2)  ! Sertraline Hcl (Sertraline Hcl) 3)  ! Cymbalta (Duloxetine Hcl)  Medication Administration  Injection # 1:    Medication: Vit B12 1000 mcg    Diagnosis: B12 DEFICIENCY (ICD-266.2)    Route: IM    Site: L deltoid    Exp Date: 01/2012    Lot #: [id number]    Mfr: American Regent    Comments: Pt will return on 04/17/10 for next injection.  Injection given in Left deltoid per patient request.    Patient tolerated injection without complications    Given by: Abelino Derrick CMA Deborra Medina) (March 19, 2010 11:18 AM)  Orders Added: 1)  Vit B12 1000 mcg [id number]

## 2010-05-07 NOTE — Assessment & Plan Note (Signed)
Summary: MONTHLY B12 INJ//266.2//SP  Nurse Visit   Allergies: 1)  ! Codeine 2)  ! Sertraline Hcl (Sertraline Hcl) 3)  ! Cymbalta (Duloxetine Hcl)  Medication Administration  Injection # 1:    Medication: Vit B12 1000 mcg    Diagnosis: B12 DEFICIENCY (ICD-266.2)    Route: IM    Site: L deltoid    Exp Date: 01/04/2012    Lot #: [id number]    Mfr: American Regent    Patient tolerated injection without complications    Given by: Marlon Pel CMA ([place]) (April 17, 2010 1:29 PM)  Orders Added: 1)  Vit B12 1000 mcg [id number]

## 2010-05-13 ENCOUNTER — Ambulatory Visit: Payer: Self-pay | Admitting: Internal Medicine

## 2010-05-14 ENCOUNTER — Encounter: Payer: Self-pay | Admitting: Internal Medicine

## 2010-05-14 ENCOUNTER — Ambulatory Visit (INDEPENDENT_AMBULATORY_CARE_PROVIDER_SITE_OTHER): Payer: MEDICARE | Admitting: Internal Medicine

## 2010-05-14 ENCOUNTER — Encounter (INDEPENDENT_AMBULATORY_CARE_PROVIDER_SITE_OTHER): Payer: MEDICARE

## 2010-05-14 ENCOUNTER — Encounter (INDEPENDENT_AMBULATORY_CARE_PROVIDER_SITE_OTHER): Payer: Self-pay | Admitting: *Deleted

## 2010-05-14 DIAGNOSIS — R0602 Shortness of breath: Secondary | ICD-10-CM

## 2010-05-14 DIAGNOSIS — E538 Deficiency of other specified B group vitamins: Secondary | ICD-10-CM

## 2010-05-21 NOTE — Assessment & Plan Note (Signed)
Summary: MONTHLY B12 SHOTS...LSW  Nurse Visit   Allergies: 1)  ! Codeine 2)  ! Sertraline Hcl (Sertraline Hcl) 3)  ! Cymbalta (Duloxetine Hcl)  Medication Administration  Injection # 1:    Medication: Vit B12 1000 mcg    Diagnosis: B12 DEFICIENCY (ICD-266.2)    Route: IM    Site: L deltoid    Exp Date: 11-13    Lot #: [id number]    Mfr: [place]    Patient tolerated injection without complications    Given by: Madlyn Frankel CMA ([place]) (May 14, 2010 11:52 AM)  Orders Added: 1)  Vit B12 1000 mcg [id number]

## 2010-05-27 NOTE — Assessment & Plan Note (Signed)
Summary: f26m/hms      Allergies Added:   Visit Type:  Follow-up Primary Provider:  [name] MD  CC:  no complaints.  History of Present Illness: 77 y/o woman with h/o HTN and osteoarthritis  referred by Dr. Linna Darner for evalaution of chronic dyspnea.  ECHO which showed EF 55% with mild AI and MR and restrictive diastolic filling pattern with very high LV filling pressures (E/E' > 25)  Myoview EF 65% with question of trivial anterior ischemia.  Also had chest CT. No evidence of PE, interstitial prominence interpreted as suggestive for pulmonary HTN. After the ech we started sprinolactone.  Underwent cath August 2011. Normal cors. normal LV function. Mildly dilated Ao root. RA 2 RV 25/0, PA 23/7 (15), PCWP 5. Central aortic pressure was 145/69 with a mean of 99.  LV pressure 145/12 with an LVEDP of 10-15.  Fick cardiac output was 5.7, cardiac index 3.0.  Pulmonary vascular resistance was 1.7 Woods units.  Femoral artery saturation was 94% on room air.  Pulmonary artery saturation was 62% and 68%.  High SVC saturation was 63%.   We saw her a few months ago and dyspnea much improved.    Returns for f/u. Dyspnea has really improved. Now doing water aerobics 3x/week. Has a hard time walking on hard surfaces due to R hip pain. No problem with ADLs. Still with neuropathic pain in feet and seeing Dr. Jannifer Franklin and taking Lyrica. No CP or signifcant edema. No cough.    Current Medications (verified): 1)  Quinapril Hcl 40 Mg  Tabs (Quinapril Hcl) .... Take One Half Tablet By Mouth Daily 2)  Centrum Silver   Tabs (Multiple Vitamins-Minerals) .... One Tablet By Mouth Once Daily 3)  Vitamin D3 2000 Unit Caps (Cholecalciferol) .[name].. 1 By Mouth Once Daily 4)  Vitamin C 1000 Mg  Tabs (Ascorbic Acid) .... One By Mouth Once Daily 5)  Adult Aspirin Ec Low Strength 81 Mg  Tbec (Aspirin) .... One By Mouth Once Daily 6)  Calcium 600 Mg Tabs (Calcium) .[name].. 1 By Mouth Once Daily 7)  Lyrica 100 Mg  Caps  (Pregabalin) .[name].. 1 By Mouth Qhs 8)  Fish Oil 1000 Mg  Caps (Omega-3 Fatty Acids) .... One By Mouth Once Daily 9)  Amlodipine Besylate 5 Mg  Tabs (Amlodipine Besylate) .[name].. 1 Once Daily Inplace of Verapamil 10)  Alprazolam 0.25 Mg Tabs (Alprazolam) .... 1/2 -1 Q 8 -12 Hrs Prn 11)  Meclizine Hcl 25 Mg Tabs (Meclizine Hcl) .... 1/2 To 1 By Mouth Once Daily 12)  Glucosamine Sulfate 500 Mg Tabs (Glucosamine Sulfate) .... 3 By Mouth Once Daily (3 Months On, 2 Months Off) 13)  Lyrica 25 Mg Caps (Pregabalin) .... Take 1 Tab At Breakfast and At Lunch 14)  Furosemide 20 Mg Tabs (Furosemide) .... Take One Tablet By Mouth  On Monday, Wednesday and Friday 15)  Cyanocobalamin 1000 Mcg/ml Inj Soln (Cyanocobalamin) .[name].. 1 Cc Im Weekly X3 16)  Se-Tan Plus 162-115.2-1 Mg Caps (Fefum-Fepo-Fa-B Cmp-C-Zn-Mn-Cu) .... Take One By Mouth Once Daily 17)  Lyrica 100 Mg Caps (Pregabalin) .... Take One Tablet At Bedtime  Allergies (verified): 1)  ! Codeine 2)  ! Sertraline Hcl (Sertraline Hcl) 3)  ! Cymbalta (Duloxetine Hcl)  Past History:  Past Medical History: Last updated: 03/09/2010 1. Dypnea 2. HTN 3. Hyperlipidemia 4. GERD 5. Fatigue 6. Anxiety 7. Hypoglycemia 8. Peripheral Neuropathy 9. Colonic polyps 10. Diverticulosis 11. Arthritis/ DJD 12. Osteoporosis 13. Pelvic pain  Review of Systems  As per HPI and past medical history; otherwise all systems negative.   Vital Signs:  Patient profile:   77 year old female Height:      66.5 inches Weight:      172.25 pounds BMI:     27.48 Pulse rate:   57 / minute BP sitting:   104 / 72  (left arm) Cuff size:   regular  Vitals Entered By: Marlis Edelson CNA (May 14, 2010 10:33 AM)  Physical Exam  General:  Well appearing. no resp difficulty HEENT: normal Neck: supple. no JVD. Carotids 2+ bilat; no bruits. No lymphadenopathy or thryomegaly appreciated. Cor: PMI nondisplaced. Regular rate & rhythm. No rubs, gallops, murmur. Lungs:  clear Abdomen: soft, nontender, nondistended. No hepatosplenomegaly. No bruits or masses. Good bowel sounds. Extremities: no cyanosis, clubbing, rash, edema Neuro: alert & orientedx3, cranial nerves grossly intact. moves all 4 extremities w/o difficulty. affect pleasant    Impression & Recommendations:  Problem # 1:  DYSPNEA/SHORTNESS OF BREATH (ICD-786.09) Resolved. Work-up reassuring.  I have encouraged her to do some land-based exercise (elliptical, exercise bike) in addition to her water aerobics to keep her conditioning up. Return in 1 year.   Other Orders: EKG w/ Interpretation (93000)  Patient Instructions: 1)  Your physician wants you to follow-up in: 1 year.  You will receive a reminder letter in the mail two months in advance. If you don't receive a letter, please call our office to schedule the follow-up appointment.

## 2010-06-17 ENCOUNTER — Telehealth: Payer: Self-pay | Admitting: Gastroenterology

## 2010-06-17 ENCOUNTER — Encounter (INDEPENDENT_AMBULATORY_CARE_PROVIDER_SITE_OTHER): Payer: MEDICARE

## 2010-06-17 ENCOUNTER — Encounter: Payer: Self-pay | Admitting: Gastroenterology

## 2010-06-17 DIAGNOSIS — E538 Deficiency of other specified B group vitamins: Secondary | ICD-10-CM

## 2010-06-20 LAB — POCT I-STAT 3, VENOUS BLOOD GAS (G3P V)
Acid-base deficit: 1 mmol/L (ref 0.0–2.0)
Bicarbonate: 26.2 meq/L — ABNORMAL HIGH (ref 20.0–24.0)
O2 Saturation: 63 %
O2 Saturation: 68 %
TCO2: 26 mmol/L (ref 0–100)
TCO2: 28 mmol/L (ref 0–100)
pCO2, Ven: 47.4 mmHg (ref 45.0–50.0)
pH, Ven: 7.344 — ABNORMAL HIGH (ref 7.250–7.300)
pH, Ven: 7.351 — ABNORMAL HIGH (ref 7.250–7.300)
pO2, Ven: 34 mmHg (ref 30.0–45.0)
pO2, Ven: 36 mmHg (ref 30.0–45.0)

## 2010-06-20 LAB — POCT I-STAT 3, ART BLOOD GAS (G3+)
Acid-base deficit: 1 mmol/L (ref 0.0–2.0)
Bicarbonate: 24.2 mEq/L — ABNORMAL HIGH (ref 20.0–24.0)
O2 Saturation: 94 %
TCO2: 25 mmol/L (ref 0–100)

## 2010-06-23 NOTE — Progress Notes (Signed)
Summary: Wonders if she needs labs   Phone Note Call from Patient Call back at [facility] Phone [phone]   Call For: Dr Sharlett Iles Reason for Call: Talk to Nurse Summary of Call: Wants to inquire when she should have more labs - to determine her continued need for B12 injections.  Initial call taken by: Irwin Brakeman [facility],  June 17, 2010 11:44 AM  Follow-up for Phone Call        Left a message on patients machine to call back and we will recheck labs when she is seen in one year.  Follow-up by: Bernita Buffy CMA Deborra Medina),  June 17, 2010 3:05 PM

## 2010-06-23 NOTE — Assessment & Plan Note (Signed)
Summary: MONTHLY B12 SHOT  Nurse Visit   Allergies: 1)  ! Codeine 2)  ! Sertraline Hcl (Sertraline Hcl) 3)  ! Cymbalta (Duloxetine Hcl)  Medication Administration  Injection # 1:    Medication: Vit B12 1000 mcg    Diagnosis: B12 DEFICIENCY (ICD-266.2)    Route: IM    Site: L deltoid    Exp Date: 02/2012    Lot #: [id number]    Mfr: American Regent    Patient tolerated injection without complications    Given by: Randye Lobo NCMA (June 17, 2010 11:41 AM)  Orders Added: 1)  Vit B12 1000 mcg [id number]

## 2010-07-20 ENCOUNTER — Ambulatory Visit (INDEPENDENT_AMBULATORY_CARE_PROVIDER_SITE_OTHER): Payer: MEDICARE | Admitting: Gastroenterology

## 2010-07-20 DIAGNOSIS — E538 Deficiency of other specified B group vitamins: Secondary | ICD-10-CM

## 2010-07-20 MED ORDER — CYANOCOBALAMIN 1000 MCG/ML IJ SOLN
1000.0000 ug | INTRAMUSCULAR | Status: AC
  Administered 2010-07-20 – 2010-08-24 (×3): 1000 ug via INTRAMUSCULAR

## 2010-07-27 ENCOUNTER — Other Ambulatory Visit: Payer: Self-pay | Admitting: Gastroenterology

## 2010-08-18 NOTE — Assessment & Plan Note (Signed)
[facility] OFFICE NOTE   NAME:Henderson, Krystal STEEB                    MRN:          [id number]  DATE:08/05/2006                            DOB:          [birth year]    Krystal Henderson was seen 08/05/2006 to evaluate chest pain.   A week prior to the office visit she was having pain which was described  as substernal and without radiation or associated nausea or diaphoresis.  It would resolve with burping. Pepcid AC has provided almost  instantaneous relief.   The evening of May 1, she had severe pain which was described as sharp  & substernal. Again Pepcid AC resulted in rapid relief.   She does aquatic exercise without cardiopulmonary symptoms. She walked  May 1 and did have profound fatigue without chest pain. All episodes of  substernal discomfort have occurred at rest.   PAST MEDICAL HISTORY:  Reveals a normal endoscopy in September 2007;  this was done for positive fecal occult blood testing. She did have  angiodysplasia of the intestine at colonoscopy at that time.   FAMILY HISTORY:  Reveals stroke in her brother at age 41, heart attack  in her brother at 67 and heart attack in her father at 47.   She has never smoked; she drinks occasionally.   MEDICATIONS:  Include; Verelan, Caltrate, Multivitamins, Glucosamine,  chondroitin, Hydrochlorothiazide, Fosamax, Accupril, Lyrica, Baby coated  aspirin. She takes alprazolam as needed.   She specifically denies dysphagia, rectal bleeding, or melena. She has  no other cardiopulmonary symptoms.   Her cardiovascular exercise has been limited by her knee and hip  problems.   She avoids caffeine.   At this time she is in no acute distress. Weight is 162.8 up almost 6  pounds. Pulse is 64, respiratory rate is 14, and blood pressure 128/80.  She has no scleral icterus; oropharynx reveals no erythema.   She has no lymphadenopathy about the head, neck, or  axilla.  She has an S4 with slowing without significant murmurs.  CHEST: Clear.   ABDOMEN: Nontender with no organomegaly.   Pedal pulses are slightly decreased; there is no edema. Homan's sign is  negative.   She will be asked to hold the Fosamax. She will be placed on Ranitidine  150 mg twice a day. CPK, MB, and troponin will be collected along with  CBC, and diff, and stool cards.   She did have an adenosine study in August 2006; she did experience chest  pain with the infusion but she had no diagnostic ST-T wave changes and  no arrhythmias. The perfusion changes were felt to be related to breast  attentuation with no evidence of ischemia. EKG at this time reveals no  ischemic changes  but only minimal nonspecific ST-T wave changes.     Darrick Penna. Linna Darner, MD,FACP,FCCP  Electronically Signed    WF[facility]/MedQ  DD: 08/05/2006  DT: 08/05/2006  Job #: [id number]

## 2010-08-21 NOTE — Assessment & Plan Note (Signed)
Krystal Henderson                           GASTROENTEROLOGY OFFICE NOTE   NAME:Krystal Henderson, Krystal Henderson                    MRN:          [id number]  DATE:12/10/2005                            DOB:          1932-09-10    HISTORY OF PRESENT ILLNESS:  Krystal Henderson is a 77 year old white female  referred via the courtesy of Dr. Linna Darner for evaluation of positive Hemoccult  cards. The patient does have a history of recurrent colon polyps with the  last colonoscopy 2 and 1/2 years ago, which was unremarkable except for some  diverticulosis. She really denies GI complaints except for excessive gas and  flatulence at this time but no abdominal pain, rectal pain, or rectal  bleeding. She denies any upper GI or hepatobiliary problems. She follows a  regular diet and denies any specific food intolerances. She specifically  denies lactose intolerance or abuse of Sorbitol and fructose. She has had no  anorexia or weight loss. I cannot see in her record where she has had  pervious endoscopic examination.   PAST MEDICAL HISTORY:  Remarkable for osteoporosis. She takes Fosamax. She  has had a previous hip replacement. She also has a history of hypertension,  chronic palpitations, but no known cardiovascular problems. She was recently  seen by Dr. Linna Darner for complete physical examination on November 03, 2005. At  that time, she had an EKG and a stress test, both of which were  unremarkable. Blood work showed a normal CBC and metabolic profile. Thyroid  function tests were normal.   In addition to the above problems, the patient has had hypertension for 25  years and degenerative arthritis for 25 years. She does carry a diagnosis of  clinical depression but is not on therapy at this time except for BuSpar,  which she is weaning off of. She said she had hemorrhoid surgery many years  ago by Dr. Lennie Hummer. She did have a tubal ligation when she was 44.   FAMILY HISTORY:  Remarkable  for atherosclerosis in her father and brother.  There are no known gastrointestinal problems. She says her family history is  remarkable for multiple family members with lung cancers.   SOCIAL HISTORY:  The patient is married and lives with her husband. She has  a high school education and is retired from [employer]. She does not smoke  and uses ethanol socially.   MEDICATIONS:  1. Fosamax 70 mg weekly.  2. Verapamil 240 mg daily.  3. Accupril 20 mg daily.  4. HCTZ 12.5 mg daily.  5. Lyrica 200 mg daily.  6. Centrum Silver daily.  7. Vitamin C, vitamin E, and p.r.n. aspirin.   ALLERGIES:  CODEINE (in the past she has had nausea and vomiting with this.)   REVIEW OF SYSTEMS:  Noncontributory except for some non-specific low back  pain, night sweats, and shortness of breath with exertion. She denies cough,  hemoptysis, or any other cardiovascular issues at this time. She  specifically denies reflux symptoms or antacid use. Otherwise,  noncontributory.   PHYSICAL EXAMINATION:  GENERAL:  She is a healthy appearing white female,  appearing  younger than her stated age.  VITAL SIGNS:  Height 5 feet, 6 inches. Weight 159 pounds. Blood pressure  110/58, pulse 70 and regular.  SKIN:  I could not appreciate stigmata of chronic liver disease or  thyromegaly.  CHEST:  Clear.  HEART:  She appeared to be in regular rhythm without significant murmur,  rub, or gallop.  ABDOMEN:  No organomegaly, masses, or tenderness. Bowel sounds were normal.  EXTREMITIES:  Peripheral extremities were unremarkable.  NEUROLOGIC:  Mental status was normal.  RECTAL:  Examination was deferred.   ASSESSMENT:  1. Recurrent guaiac positive stools of unexplained etiology. We need to      repeat her colonoscopy and she probably needs upper GI pan-endoscopy      additionally, to rule out an upper GI source. She does have a history      of previous colon polyps.  2. Non-cardiac chest pain of unexplained etiology.  3.  Osteoporosis, rule out celiac disease.  4. History of peripheral neuropathy, on Lyrica therapy.  5. History of essential hypertension.  6. Vague history of mitral valve prolapse.   RECOMMENDATIONS:  1. Outpatient colonoscopy at voluntary convenience.  2. Trial of daily Benefiber with liberal p.o. fluids.  3. Low gas diet with trial of Align pro-body therapy for her excessive      flatulence.  4. Check celiac panel, iron levels, B12, and folate.  5. Other medications as per Dr. Linna Darner.                                   Loralee Pacas. Sharlett Iles, MD, [facility], [place]   DRP/MedQ  DD:  12/10/2005  DT:  12/10/2005  Job #:  [id number]   cc:   Darrick Penna. Linna Darner, MD,FACP,FCCP  S. Olena Mater, M.D.  Jill Alexanders, M.D.

## 2010-08-21 NOTE — Assessment & Plan Note (Signed)
[facility] OFFICE NOTE   NAME:Krystal Henderson, Krystal Henderson                    MRN:          [id number]  DATE:11/14/2005                            DOB:          04-02-1933    [name] underwent a complete physical examination __________  Medicare on November 03, 2005.  She was informed that Medicare does not cover  physicals and her active medical problems were addressed.   She had been having intermittent chest pain like a bolt of lightening  which was substernal and lasting less than five minutes and nonradiating.  The symptoms began September 23, 2005, while asleep.  This occurred approximately  3 to 4 a.m. and woke her up.  She denied any food at bedtime.  She had  recurrence September 24, 2005, September 25, 2005, and September 26, 2005.  There was also  similar symptoms on September 28, 2005, and September 29, 2005, during the day,  manifested as a pressure which was less severe.  She treated this with  aspirin and Tums.  She did not seek attention as she knew I was coming in  for this physical.  She denied any nausea, vomiting, or diaphoresis.  She  had no melena or dysphagia.   Her exercise has been limited by her hip problems, which she had surgery in  2000.  She takes Tylenol and other nonsteroidals for her hip pain.   Weight was down 3 pounds to 157.  Blood pressure was 110/70, pulse 70 and  regular.  She had S4 with slowing.  Pedal pulses were decreased. Holman's  sign was negative.  Abdominal exam was unremarkable.  Skin was warm and dry.   She had had extensive labs September 06, 2005, for shortness of breath and  indigestion.  She was anxious in reference to her husband's coronary  disease.  She had been intolerant to Sertraline and Cymbalta.  Buspirone 15  mg one half to one twice a day was recommended.  Also at that time, she was  placed on Accupril 20 mg twice a day because of blood pressure 150/88.  As  noted, she had had  a significant improvement in her blood pressure at the  time of the follow-up visit November 03, 2005.   In June normal and negative studies include a CBC and differential and CMET,  free T4 and TSH.   Electrocardiogram revealed minor nonspecific STT wave changes with no  suggestion of ischemia.   Discussion of this stress test was held but this was done in August of 2006.  She was placed on Pepcid AC every 12 hours.  An NMR [employer] profile was  to be collected.  Stool cards were completed.   Three of the six stool cards were positive.  Because of the symptomatology,  referral to the gastroenterologist of her choice will be pursued.  She last  had a colonoscopy in March of 2005 from Dr. Sharlett Iles which revealed  diverticulosis.   Her present symptoms suggest that an upper endo process may be present.  Darrick Penna. Linna Darner, MD, [place], [facility]   WF[facility]/MedQ  DD:  11/14/2005  DT:  11/15/2005  Job #:  [id number]

## 2010-08-24 ENCOUNTER — Other Ambulatory Visit: Payer: Self-pay | Admitting: *Deleted

## 2010-08-24 ENCOUNTER — Ambulatory Visit (INDEPENDENT_AMBULATORY_CARE_PROVIDER_SITE_OTHER): Payer: 59 | Admitting: Gastroenterology

## 2010-08-24 DIAGNOSIS — E538 Deficiency of other specified B group vitamins: Secondary | ICD-10-CM

## 2010-08-24 NOTE — Progress Notes (Signed)
Pt  Here for B12 Injection.

## 2010-08-25 ENCOUNTER — Other Ambulatory Visit (INDEPENDENT_AMBULATORY_CARE_PROVIDER_SITE_OTHER): Payer: 59

## 2010-08-25 DIAGNOSIS — N259 Disorder resulting from impaired renal tubular function, unspecified: Secondary | ICD-10-CM

## 2010-08-25 LAB — BUN: BUN: 20 mg/dL (ref 6–23)

## 2010-09-22 ENCOUNTER — Ambulatory Visit: Payer: 59 | Admitting: Gastroenterology

## 2010-09-24 ENCOUNTER — Ambulatory Visit (INDEPENDENT_AMBULATORY_CARE_PROVIDER_SITE_OTHER): Payer: Medicare Other | Admitting: Gastroenterology

## 2010-09-24 ENCOUNTER — Other Ambulatory Visit (INDEPENDENT_AMBULATORY_CARE_PROVIDER_SITE_OTHER): Payer: Medicare Other

## 2010-09-24 ENCOUNTER — Encounter: Payer: Self-pay | Admitting: Gastroenterology

## 2010-09-24 VITALS — BP 124/62 | HR 68 | Ht 66.0 in | Wt 176.0 lb

## 2010-09-24 DIAGNOSIS — E538 Deficiency of other specified B group vitamins: Secondary | ICD-10-CM

## 2010-09-24 DIAGNOSIS — G609 Hereditary and idiopathic neuropathy, unspecified: Secondary | ICD-10-CM

## 2010-09-24 DIAGNOSIS — G629 Polyneuropathy, unspecified: Secondary | ICD-10-CM | POA: Insufficient documentation

## 2010-09-24 DIAGNOSIS — D649 Anemia, unspecified: Secondary | ICD-10-CM

## 2010-09-24 DIAGNOSIS — Z862 Personal history of diseases of the blood and blood-forming organs and certain disorders involving the immune mechanism: Secondary | ICD-10-CM

## 2010-09-24 DIAGNOSIS — K589 Irritable bowel syndrome without diarrhea: Secondary | ICD-10-CM

## 2010-09-24 LAB — CBC WITH DIFFERENTIAL/PLATELET
Basophils Relative: 0.4 % (ref 0.0–3.0)
Eosinophils Absolute: 0.1 10*3/uL (ref 0.0–0.7)
Eosinophils Relative: 2 % (ref 0.0–5.0)
Hemoglobin: 12.6 g/dL (ref 12.0–15.0)
Lymphocytes Relative: 28.5 % (ref 12.0–46.0)
MCHC: 33.7 g/dL (ref 30.0–36.0)
Monocytes Relative: 7.4 % (ref 3.0–12.0)
Neutro Abs: 3.7 10*3/uL (ref 1.4–7.7)
Neutrophils Relative %: 61.7 % (ref 43.0–77.0)
RBC: 3.85 Mil/uL — ABNORMAL LOW (ref 3.87–5.11)
WBC: 5.9 10*3/uL (ref 4.5–10.5)

## 2010-09-24 LAB — IBC PANEL
Saturation Ratios: 27.1 % (ref 20.0–50.0)
Transferrin: 253 mg/dL (ref 212.0–360.0)

## 2010-09-24 LAB — FERRITIN: Ferritin: 43.1 ng/mL (ref 10.0–291.0)

## 2010-09-24 MED ORDER — LIDOCAINE 5 % EX PTCH: 1.0000 | MEDICATED_PATCH | Freq: Two times a day (BID) | CUTANEOUS | Status: DC

## 2010-09-24 NOTE — Progress Notes (Signed)
This is a 77 year old Caucasian female with constipation predominant IBS and previous rectal bleeding which has abated. She takes daily fiber supplements, MiraLax, and liberal by mouth fluids. She denies upper gastrointestinal hepatobiliary complaints. Her appetite is good, her weight is stable, and she denies any food intolerances. There is no history of abuse of alcohol, cigarettes, or NSAIDs. She has severe peripheral neuropathy in a stocking pattern in both legs felt secondary perhaps to spinal nerve root irritation. She is followed by Dr. Floyde Parkins in neurology and currently is on Lyrica several times a day. Patient has known B12 deficiency and is on Premarin replacement therapy. She also has a history of osteoporosis and chronic iron deficiency. There is no known history of celiac disease in her family, no inflammatory bowel disease, or other gastrointestinal illnesses. Patient is up-to-date on her endoscopy and colonoscopy exams.  Current Medications, Allergies, Past Medical History, Past Surgical History, Family History and Social History were reviewed in [employer] record.  Pertinent Review of Systems Negative   Physical Exam: Awake and alert no acute distress appearing her stated age. Chest is clear cardiac exam shows no murmurs gallops or rubs. I cannot appreciate hepatosplenomegaly, abdominal masses or tenderness. Bowel sounds are normal. Examination performed extremities unremarkable without rashes, joint swelling, phlebitis, or dermatitis. Mental status appears normal.    Assessment and Plan: Constipation predominant IBS doing well on current medications. We'll repeat her CBC and anemia profile and continue all medications as listed and reviewed her record. Have given her some Lidoderm patches to try for topical use on her lower extremities because of her peripheral neuropathy. Otherwise we will see her on a when necessary basis as needed. Encounter Diagnosis    Name Primary?  . Vitamin B12 deficiency Yes   Please copy her primary care physician, referring physician, and pertinent subspecialists.

## 2010-09-24 NOTE — Patient Instructions (Signed)
Your prescription(s) have been sent to you pharmacy.  Please go to the basement today for your labs.   

## 2010-11-12 ENCOUNTER — Telehealth: Payer: Self-pay | Admitting: Gastroenterology

## 2010-11-12 NOTE — Telephone Encounter (Signed)
Pt aware all labs normal

## 2010-12-08 ENCOUNTER — Other Ambulatory Visit: Payer: Self-pay | Admitting: Internal Medicine

## 2011-02-01 ENCOUNTER — Other Ambulatory Visit: Payer: Self-pay | Admitting: Internal Medicine

## 2011-02-24 ENCOUNTER — Other Ambulatory Visit: Payer: Self-pay | Admitting: Gynecology

## 2011-02-24 DIAGNOSIS — Z1231 Encounter for screening mammogram for malignant neoplasm of breast: Secondary | ICD-10-CM

## 2011-03-12 ENCOUNTER — Encounter: Payer: Self-pay | Admitting: Internal Medicine

## 2011-03-15 ENCOUNTER — Ambulatory Visit: Payer: Medicare Other

## 2011-03-15 ENCOUNTER — Ambulatory Visit (INDEPENDENT_AMBULATORY_CARE_PROVIDER_SITE_OTHER): Payer: Medicare Other | Admitting: Internal Medicine

## 2011-03-15 ENCOUNTER — Encounter: Payer: Self-pay | Admitting: Internal Medicine

## 2011-03-15 DIAGNOSIS — Z23 Encounter for immunization: Secondary | ICD-10-CM

## 2011-03-15 DIAGNOSIS — E782 Mixed hyperlipidemia: Secondary | ICD-10-CM

## 2011-03-15 DIAGNOSIS — I10 Essential (primary) hypertension: Secondary | ICD-10-CM

## 2011-03-15 DIAGNOSIS — E162 Hypoglycemia, unspecified: Secondary | ICD-10-CM

## 2011-03-15 DIAGNOSIS — M810 Age-related osteoporosis without current pathological fracture: Secondary | ICD-10-CM

## 2011-03-15 DIAGNOSIS — Z0000 Encounter for general adult medical examination without abnormal findings: Secondary | ICD-10-CM

## 2011-03-15 LAB — BASIC METABOLIC PANEL
BUN: 21 mg/dL (ref 6–23)
CO2: 25 mEq/L (ref 19–32)
Chloride: 104 mEq/L (ref 96–112)
Creatinine, Ser: 1.2 mg/dL (ref 0.4–1.2)
Glucose, Bld: 89 mg/dL (ref 70–99)
Potassium: 4.1 mEq/L (ref 3.5–5.1)

## 2011-03-15 LAB — LDL CHOLESTEROL, DIRECT: Direct LDL: 117.6 mg/dL

## 2011-03-15 LAB — LIPID PANEL
Cholesterol: 236 mg/dL — ABNORMAL HIGH (ref 0–200)
Total CHOL/HDL Ratio: 2
VLDL: 12.4 mg/dL (ref 0.0–40.0)

## 2011-03-15 LAB — TSH: TSH: 1.32 u[IU]/mL (ref 0.35–5.50)

## 2011-03-15 NOTE — Patient Instructions (Signed)
Preventive Health Care: Exercise  30-45  minutes a day, 3-4 days a week. Walking is especially valuable in preventing Osteoporosis. Eat a low-fat diet with lots of fruits and vegetables, up to 7-9 servings per day. Consume less than 30 grams of sugar per day from foods & drinks with High Fructose Corn Syrup as # 1,2,3 or #4 on label. .Share results with all MDs seen

## 2011-03-15 NOTE — Progress Notes (Signed)
Subjective:    Patient ID: Krystal Henderson, female    DOB: Feb 25, 1933, 77 y.o.   MRN: [id number]  HPI Medicare Wellness Visit:  The following psychosocial & medical history were reviewed as required by Medicare.   Social history: caffeine: no , alcohol:  Wine 1-2 daily  intermittently ,  tobacco use : never  & exercise : water aerobics 3x/ week  for 30-60 min.   Home & personal  safety / fall risk: staggers occasionally, activities of daily living: no limitations , seatbelt use : yes , and smoke alarm employment : yes .  Power of Attorney/Living Will status : in place  Vision ( as recorded per Nurse) & Hearing  evaluation : Ophth exam due 1/13;blurred vision OS ( long standing corneal scar). Whisper heard @ 6 ft Orientation :oriented x 3 , memory & recall : good, math testing good;:she became flustered with spelling  attempts ( "DLORW") ,and mood & affect : normal . Depression / anxiety: intermittent Travel history : [place] 2008  , immunization status :Shingles needed , transfusion history:  no, and preventive health surveillance ( colonoscopies, BMD , etc as per protocol/ [facility]): colonoscopy 5/12, Dental care:  Seen annually; implants . Chart reviewed &  Updated. Active issues reviewed & addressed.       Review of Systems HYPERTENSION: Disease Monitoring: Blood pressure averages 126/80  Chest pain, palpitations- no       Dyspnea- no Medications: Compliance- yes  Lightheadedness,Syncope- occasional lightheadedness in sun    Edema- no  Reactive Hypoglycemia: Polyuria/phagia/dipsia- no       Visual problems- ? Early cataracts & corneal scar (see above) Hypoglycemic symptoms- no recent episodes  HYPERLIPIDEMIA: LDL 130 in 2006 Disease Monitoring: See symptoms for Hypertension Medications: Compliance- never on statin     ROS:Abd pain, bowel changes- no;Muscle aches- no      Objective:   Physical Exam Gen.: Healthy and well-nourished in appearance. Alert, appropriate and  cooperative throughout exam. Appears younger than stated age Head: Normocephalic without obvious abnormalities Eyes: No corneal or conjunctival inflammation noted. Pupils equal round reactive to light and accommodation.  Extraocular motion intact.  Ears: External  ear exam reveals no significant lesions or deformities. Canals: some wax bilaterally. Nose: External nasal exam reveals no deformity or inflammation. Nasal mucosa are pink and moist. No lesions or exudates noted.  Mouth: Oral mucosa and oropharynx reveal no lesions or exudates. Dentures & implantsNeck: No deformities, masses, or tenderness noted. Range of motion slightly decreased. Thyroid normal. Lungs: Normal respiratory effort; chest expands symmetrically. Lungs are clear to auscultation without rales, wheezes, or increased work of breathing. Heart: Normal rate and rhythm. Normal S1 . No gallop, click, or rub. Spitting S2; no murmur. Abdomen: Bowel sounds normal; abdomen soft and nontender. No masses, organomegaly or hernias noted. Genitalia: Dr  Ubaldo Glassing   .                                                                                   Musculoskeletal/extremities: Slight lordosis noted of  the thoracic  spine. No clubbing, cyanosis, edema noted. Range of motion  normal .Tone & strength  Normal.Joints;minor DIP OA changes. Nail  health  good. Vascular: Carotid, radial artery, dorsalis pedis and  posterior tibial pulses are full and equal. No bruits present. Neurologic: Alert and oriented x3. Deep tendon reflexes symmetrical and normal.          Skin: Intact without suspicious lesions or rashes. Lymph: No cervical, axillary, or inguinal lymphadenopathy present. Psych: Mood and affect are normal. Normally interactive                                                                                         Assessment & Plan:  #1 Medicare Wellness Exam; criteria met ; data entered #2 Problem List reviewed ; Assessment/ Recommendations  made Plan: see Orders

## 2011-03-16 LAB — VITAMIN D 25 HYDROXY (VIT D DEFICIENCY, FRACTURES): Vit D, 25-Hydroxy: 60 ng/mL (ref 30–89)

## 2011-03-23 ENCOUNTER — Ambulatory Visit
Admission: RE | Admit: 2011-03-23 | Discharge: 2011-03-23 | Disposition: A | Discharge status: Home / Self Care | Payer: Medicare Other | Source: Ambulatory Visit | Attending: Gynecology | Admitting: Gynecology

## 2011-03-23 DIAGNOSIS — Z1231 Encounter for screening mammogram for malignant neoplasm of breast: Secondary | ICD-10-CM

## 2011-04-05 ENCOUNTER — Encounter: Payer: Self-pay | Admitting: Internal Medicine

## 2011-05-03 ENCOUNTER — Other Ambulatory Visit: Payer: Self-pay | Admitting: Internal Medicine

## 2011-05-05 ENCOUNTER — Encounter: Payer: Self-pay | Admitting: Internal Medicine

## 2011-05-05 ENCOUNTER — Ambulatory Visit (INDEPENDENT_AMBULATORY_CARE_PROVIDER_SITE_OTHER): Payer: Medicare Other | Admitting: Internal Medicine

## 2011-05-05 DIAGNOSIS — J029 Acute pharyngitis, unspecified: Secondary | ICD-10-CM

## 2011-05-05 DIAGNOSIS — J209 Acute bronchitis, unspecified: Secondary | ICD-10-CM

## 2011-05-05 LAB — POCT RAPID STREP A (OFFICE): Rapid Strep A Screen: NEGATIVE

## 2011-05-05 MED ORDER — AZITHROMYCIN 250 MG PO TABS: ORAL_TABLET | ORAL | Status: AC

## 2011-05-05 MED ORDER — BENZONATATE 200 MG PO CAPS: 200.0000 mg | ORAL_CAPSULE | Freq: Three times a day (TID) | ORAL | Status: AC | PRN

## 2011-05-05 NOTE — Patient Instructions (Addendum)
Zicam Melts or Zinc lozenges ; vitamin C 2000 mg daily; & Echinacea for 4-7 days. Report fever, exudate("pus") or progressive pain. Plain Mucinex for thick secretions ;force NON dairy fluids . Use a Neti pot daily as needed for sinus congestion

## 2011-05-05 NOTE — Progress Notes (Signed)
  Subjective:    Patient ID: Krystal Henderson, female    DOB: 1932-11-21, 78 y.o.   MRN: [id number]  HPI Respiratory tract infection Onset/symptoms:1/28 as ST Exposures (illness/environmental/extrinsic):husband ill Progression of symptoms:to cough & headcongestion Treatments/response:none Present symptoms: Fever/chills/sweats:chills Frontal headache:no Facial pain:no Nasal purulence:no Sore throat:slightly Dental pain:no Lymphadenopathy:no Wheezing/shortness of breath:DOE Cough/sputum/hemoptysis:white sputum Associated extrinsic/allergic symptoms:itchy eyes/ sneezing:no Smoking history:never           Review of Systems     Objective:   Physical Exam General appearance:good health ;well nourished; no acute distress or increased work of breathing is present.  No  lymphadenopathy about the head, neck, or axilla noted.   Eyes: No conjunctival inflammation or lid edema is present.   Ears:  External ear exam shows no significant lesions or deformities.  Otoscopic examination reveals clear canals, tympanic membranes are intact bilaterally without bulging, retraction, inflammation or discharge.  Nose:  External nasal examination shows no deformity or inflammation. Nasal mucosa are pink and moist without lesions or exudates. No septal dislocation or deviation.No obstruction to airflow.   Oral exam: Dentures; lips and gums are healthy appearing.There is minimal oropharyngeal erythema w/o exudate    Heart:  Normal rate and regular rhythm. S1 and S2 normal without gallop, murmur, click, rub or other extra sounds.   Lungs:Chest clear to auscultation; no wheezes, rhonchi,rales ,or rubs present.No increased work of breathing.  Intermittent brassy cough  Extremities:  No cyanosis, edema, or clubbing  noted    Skin: Warm & dry           Assessment & Plan:  #1 pharyngitis  #2 bronchitis  Plan: See orders and recommendations

## 2011-05-08 ENCOUNTER — Ambulatory Visit (INDEPENDENT_AMBULATORY_CARE_PROVIDER_SITE_OTHER): Payer: Medicare Other | Admitting: Internal Medicine

## 2011-05-08 ENCOUNTER — Ambulatory Visit (HOSPITAL_COMMUNITY)
Admission: RE | Admit: 2011-05-08 | Discharge: 2011-05-08 | Disposition: A | Discharge status: Home / Self Care | Payer: Medicare Other | Source: Ambulatory Visit | Attending: Internal Medicine | Admitting: Internal Medicine

## 2011-05-08 ENCOUNTER — Encounter: Payer: Self-pay | Admitting: Internal Medicine

## 2011-05-08 ENCOUNTER — Telehealth: Payer: Self-pay | Admitting: *Deleted

## 2011-05-08 VITALS — BP 110/58 | HR 83 | Temp 100.4°F | Ht 66.0 in | Wt 180.0 lb

## 2011-05-08 DIAGNOSIS — R059 Cough, unspecified: Secondary | ICD-10-CM | POA: Insufficient documentation

## 2011-05-08 DIAGNOSIS — R0602 Shortness of breath: Secondary | ICD-10-CM | POA: Insufficient documentation

## 2011-05-08 DIAGNOSIS — J189 Pneumonia, unspecified organism: Secondary | ICD-10-CM | POA: Insufficient documentation

## 2011-05-08 DIAGNOSIS — R05 Cough: Secondary | ICD-10-CM | POA: Insufficient documentation

## 2011-05-08 MED ORDER — ALBUTEROL SULFATE (2.5 MG/3ML) 0.083% IN NEBU: 2.5000 mg | INHALATION_SOLUTION | Freq: Four times a day (QID) | RESPIRATORY_TRACT | Status: DC | PRN

## 2011-05-08 MED ORDER — AMOXICILLIN-POT CLAVULANATE 875-125 MG PO TABS: 1.0000 | ORAL_TABLET | Freq: Two times a day (BID) | ORAL | Status: AC

## 2011-05-08 MED ORDER — HYDROCODONE-ACETAMINOPHEN 10-325 MG PO TABS: 1.0000 | ORAL_TABLET | Freq: Four times a day (QID) | ORAL | Status: AC | PRN

## 2011-05-08 MED ORDER — METHYLPREDNISOLONE ACETATE PF 80 MG/ML IJ SUSP
40.0000 mg | Freq: Once | INTRAMUSCULAR | Status: AC
  Administered 2011-05-08: 40 mg via INTRAMUSCULAR

## 2011-05-08 MED ORDER — PREDNISONE 10 MG PO TABS: 10.0000 mg | ORAL_TABLET | Freq: Every day | ORAL | Status: DC

## 2011-05-08 NOTE — Telephone Encounter (Signed)
Informed patient of results of cxr and that Dr Derrel Nip advised her to continue with current tx plan, pt agreed.

## 2011-05-08 NOTE — Progress Notes (Signed)
Subjective:    Patient ID: Krystal Henderson, female    DOB: [birth year], 78 y.o.   MRN: [id number]  HPI  78 yr old white female with no history of lung disease presents with 6 day history of productive cough,  Chills, dyspnea, seen on Tuesday by Dr. Linna Darner and given rx for z pack and tessalon.  Symptoms had gotten progressively worse over the last 72 hours despite compliance with treatment.  Having severe frequent cough productive of green sputum, She feels tight in the chest and has dyspnea with exertion.  Husband was sick earlier (last week) .  She did have a flu vaccine but has not been tested for influenza.  No recent travel. Review of prior notes indicate hypoxia 4 days ago with sats of 87% but on well visit in Dec sats were 90% .  She hs not had a chest x ray.   Past Medical History  Diagnosis Date  . HTN (hypertension)   . Hyperlipemia 2006    LDL 130  . Esophageal reflux   . Anxiety   . Hypoglycemia     reactive  . Peripheral neuropathy   . Vitamin B12 deficiency   . Personal history of colonic polyps     adenomatous  . Diverticulosis of colon (without mention of hemorrhage)   . DJD (degenerative joint disease)   . Osteoporosis   . Angiodysplasia 2007    @ colonoscopy    Current Outpatient Prescriptions on File Prior to Visit  Medication Sig Dispense Refill  . ALPRAZolam (XANAX) 0.25 MG tablet Take .5-1 tablet by mouth every 8-12 hours as needed       . amLODipine (NORVASC) 5 MG tablet take 1 tablet by mouth once daily (THIS REPLACES VERAPAMIL)  90 tablet  2  . Ascorbic Acid (VITAMIN C) 1000 MG tablet Take 1,000 mg by mouth daily.        [name] aspirin 81 MG tablet Take 81 mg by mouth daily.        [name] azithromycin (ZITHROMAX Z-PAK) 250 MG tablet 2 day 1, then 1 qd  6 each  0  . benzonatate (TESSALON) 200 MG capsule Take 1 capsule (200 mg total) by mouth 3 (three) times daily as needed for cough.  15 capsule  0  . Calcium Carbonate (CALCIUM 600 PO) Take 1 capsule by mouth daily.         . Cholecalciferol (VITAMIN D) 2000 UNITS CAPS Take 1 capsule by mouth daily.        . furosemide (LASIX) 20 MG tablet take 1 tablet by mouth EVERY MONDAY, WEDNESDAY, AND FRIDAY.  15 tablet  5  . meclizine (ANTIVERT) 25 MG tablet Take .5-1 tablet by mouth once a day       . Multiple Vitamins-Minerals (CENTRUM SILVER PO) Take 1 capsule by mouth daily.        . Omega-3 Fatty Acids (FISH OIL) 1000 MG CAPS Take 1 capsule by mouth daily.        . pregabalin (LYRICA) 100 MG capsule Take 100 mg by mouth at bedtime.        . pregabalin (LYRICA) 25 MG capsule Take 25 mg by mouth 2 (two) times daily. One in the morning and one at noon      . quinapril (ACCUPRIL) 40 MG tablet TAKE 1/2 TABLET BY MOUTH ONCE DAILY  45 tablet  2   BP 110/58  Pulse 83  Temp(Src) 100.4 F (38 C) (Oral)  Ht 5\' 6"  (1.676  m)  Wt 180 lb (81.647 kg)  BMI 29.05 kg/m2  SpO2 87%   Review of Systems  Constitutional: Positive for fever and fatigue. Negative for chills and unexpected weight change.  HENT: Negative for hearing loss, ear pain, nosebleeds, congestion, sore throat, facial swelling, rhinorrhea, sneezing, mouth sores, trouble swallowing, neck pain, neck stiffness, voice change, postnasal drip, sinus pressure, tinnitus and ear discharge.   Eyes: Negative for pain, discharge, redness and visual disturbance.  Respiratory: Positive for cough, chest tightness, shortness of breath and wheezing. Negative for stridor.   Cardiovascular: Negative for chest pain, palpitations and leg swelling.  Musculoskeletal: Negative for myalgias and arthralgias.  Skin: Negative for color change and rash.  Neurological: Negative for dizziness, weakness, light-headedness and headaches.  Hematological: Negative for adenopathy.       Objective:   Physical Exam  Constitutional: She is oriented to person, place, and time. She appears well-developed and well-nourished.       Sickly appearing but not in distress  HENT:  Mouth/Throat:  Oropharynx is clear and moist.  Eyes: EOM are normal. Pupils are equal, round, and reactive to light. No scleral icterus.  Neck: Normal range of motion. Neck supple. No JVD present. No thyromegaly present.  Cardiovascular: Normal rate, regular rhythm, normal heart sounds and intact distal pulses.   Pulmonary/Chest: Effort normal. No accessory muscle usage. Not tachypneic. No respiratory distress. She has wheezes. She has rales.  Abdominal: Soft. Bowel sounds are normal. She exhibits no mass. There is no tenderness.  Musculoskeletal: Normal range of motion. She exhibits no edema.  Lymphadenopathy:    She has no cervical adenopathy.  Neurological: She is alert and oriented to person, place, and time.  Skin: Skin is warm and dry.  Psychiatric: She has a normal mood and affect.       Assessment & Plan:   Pneumonia With resting hypoxia to 86% and desaturations to 82% with ambulation.  I have recommended hospitalization and she is resistant to the idea.  She received a dose of IM solumedrol and Xopenex in the office and her sats have temporarily improved.  I am sending her home with supplemental 02,  Albuterol nebs 2.5 mg/110ml ,  [facility] to supply nebulizer machine. Broadening abx to augmentin and adding prednisone.  She needs to have supplemental 02 at 2 L/min to wear 24/7 for the next week.  She needs to follow up with her PCP Hopper in one week, provided she does not worsen.  If she devlops worsening cough, shortness of breath,  Or dizziness, she has been instructed to go to the ER.   A total of 60 minutes was spent with this patient , 30 of which was spent in counseling and arranging Home health   Updated Medication List Outpatient Encounter Prescriptions as of 05/08/2011  Medication Sig Dispense Refill  . ALPRAZolam (XANAX) 0.25 MG tablet Take .5-1 tablet by mouth every 8-12 hours as needed       . amLODipine (NORVASC) 5 MG tablet take 1 tablet by mouth once daily (THIS REPLACES  VERAPAMIL)  90 tablet  2  . Ascorbic Acid (VITAMIN C) 1000 MG tablet Take 1,000 mg by mouth daily.        [name] aspirin 81 MG tablet Take 81 mg by mouth daily.        [name] azithromycin (ZITHROMAX Z-PAK) 250 MG tablet 2 day 1, then 1 qd  6 each  0  . benzonatate (TESSALON) 200 MG capsule Take 1 capsule (  200 mg total) by mouth 3 (three) times daily as needed for cough.  15 capsule  0  . Calcium Carbonate (CALCIUM 600 PO) Take 1 capsule by mouth daily.        . Cholecalciferol (VITAMIN D) 2000 UNITS CAPS Take 1 capsule by mouth daily.        . furosemide (LASIX) 20 MG tablet take 1 tablet by mouth EVERY MONDAY, WEDNESDAY, AND FRIDAY.  15 tablet  5  . meclizine (ANTIVERT) 25 MG tablet Take .5-1 tablet by mouth once a day       . Multiple Vitamins-Minerals (CENTRUM SILVER PO) Take 1 capsule by mouth daily.        . Omega-3 Fatty Acids (FISH OIL) 1000 MG CAPS Take 1 capsule by mouth daily.        . pregabalin (LYRICA) 100 MG capsule Take 100 mg by mouth at bedtime.        . pregabalin (LYRICA) 25 MG capsule Take 25 mg by mouth 2 (two) times daily. One in the morning and one at noon      . quinapril (ACCUPRIL) 40 MG tablet TAKE 1/2 TABLET BY MOUTH ONCE DAILY  45 tablet  2  . albuterol (PROVENTIL) (2.5 MG/3ML) 0.083% nebulizer solution Take 3 mLs (2.5 mg total) by nebulization every 6 (six) hours as needed for wheezing or shortness of breath.  150 mL  1  . amoxicillin-clavulanate (AUGMENTIN) 875-125 MG per tablet Take 1 tablet by mouth 2 (two) times daily.  14 tablet  0  . HYDROcodone-acetaminophen (NORCO) 10-325 MG per tablet Take 1 tablet by mouth every 6 (six) hours as needed (cough).  40 tablet  0  . predniSONE (DELTASONE) 10 MG tablet Take 1 tablet (10 mg total) by mouth daily. 6 tablets twice daily for 3 days,  Then begin the taper:  6 tablets once on  Day 4, tablets once on Day 5, 4 tablets once on day 3 , etc  57 tablet  0

## 2011-05-08 NOTE — Assessment & Plan Note (Addendum)
With resting hypoxia to 86% and desaturations to 82% with ambulation.  I have recommended hospitalization and she is resistant to the idea.  She received a dose of IM solumedrol and Xopenex in the office and her sats have temporarily improved.  I am sending her home with supplemental 02,  Albuterol nebs 2.5 mg/7ml ,  [facility] to supply nebulizer machine. Broadening abx to augmentin and adding prednisone.  She needs to have supplemental 02 at 2 L/min to wear 24/7 for the next week.  She needs to follow up with her PCP Hopper in one week, provided she does not worsen.  If she devlops worsening cough, shortness of breath,  Or dizziness, she has been instructed to go to the ER.

## 2011-05-08 NOTE — Patient Instructions (Addendum)
I am treating your for pneumonia and asthma with a stronger antibioitic,  prednisone,  Oxygen by nasla cannula,  and a medicine you inhale  using the nebulizer  to dilate your airways.  The prednisone should be started tonight and the dose changes as follows:   60 mg (6 tablets) two times daily for 3 days, then: 60 mg (6 tablets ) one time daily on day 4 50 mg (5 tablets) one time daily on Day 5. 40 mg on Day 6,   30 mg on day 7  20 mg on day 8 10 mg on day 9 DONE  The anitibitoic is augmentin and should be taken twice daily for 7 days   Use the nebulizer every 6 hours as needed for wheezing. Use the oxygen 24 hours per day to keep your oxygen level up.  followup with Dr. Huey Bienenstock in one week.

## 2011-05-10 ENCOUNTER — Telehealth: Payer: Self-pay | Admitting: Internal Medicine

## 2011-05-10 NOTE — Telephone Encounter (Signed)
Patient was seen at [facility] Saturday clinic.

## 2011-05-10 NOTE — Telephone Encounter (Signed)
Advised pt to continue meds, except hydrocodone b/c it causes nausea/vomiting and f/u w/Dr Linna Darner at the end of the week.

## 2011-05-13 ENCOUNTER — Telehealth: Payer: Self-pay | Admitting: *Deleted

## 2011-05-13 NOTE — Telephone Encounter (Signed)
Tired calling patient x 2, but line is busy. Will try again later.

## 2011-05-13 NOTE — Telephone Encounter (Signed)
Patient was seen at the Saturday clinic. She says that she stopped the hydrocodone because she thought that it was making her feel sick, but she says that she is still feeling sick, still having nausea, feels dizzy at times. She is asking if she can stop taking the prednisone now because she thinks that it may be from it. Please advise.

## 2011-05-13 NOTE — Telephone Encounter (Signed)
I called patient again. She has an appt with Dr. Linna Darner tomorrow. She says that she hasn't taken the augmentin today and she is feeling better. She says that she will wait and discuss this with Dr. Linna Darner tomorrow.

## 2011-05-13 NOTE — Telephone Encounter (Signed)
She was treated for pneumonia and hypoxia at the Saturday clinic.  Her regular doctor Dr Linna Darner had seen her 4 days earlier and she had gotten worse. I prescribed oxygen ,  augmentin, prednisone, hydrocodone for cough, and albuterol nebs.  The nausea may be from the augmentin,  The dizziness may be from the nebulizer or from her low oxygen.  I cannot tell without seeing her.   Is she taking the augmentin after a full meal as directed.  I think she needs to finish the antibiotic.  Has she made an appt with her regular doctor?

## 2011-05-14 ENCOUNTER — Ambulatory Visit (INDEPENDENT_AMBULATORY_CARE_PROVIDER_SITE_OTHER): Payer: Medicare Other | Admitting: Internal Medicine

## 2011-05-14 ENCOUNTER — Encounter: Payer: Self-pay | Admitting: Internal Medicine

## 2011-05-14 ENCOUNTER — Telehealth: Payer: Self-pay | Admitting: Internal Medicine

## 2011-05-14 DIAGNOSIS — J9819 Other pulmonary collapse: Secondary | ICD-10-CM

## 2011-05-14 DIAGNOSIS — J9811 Atelectasis: Secondary | ICD-10-CM

## 2011-05-14 DIAGNOSIS — I10 Essential (primary) hypertension: Secondary | ICD-10-CM

## 2011-05-14 DIAGNOSIS — R197 Diarrhea, unspecified: Secondary | ICD-10-CM

## 2011-05-14 DIAGNOSIS — R05 Cough: Secondary | ICD-10-CM

## 2011-05-14 LAB — CBC WITH DIFFERENTIAL/PLATELET
Basophils Absolute: 0 10*3/uL (ref 0.0–0.1)
Lymphocytes Relative: 16.9 % (ref 12.0–46.0)
Monocytes Relative: 6.2 % (ref 3.0–12.0)
Neutrophils Relative %: 76.8 % (ref 43.0–77.0)
Platelets: 484 10*3/uL — ABNORMAL HIGH (ref 150.0–400.0)
RDW: 14.4 % (ref 11.5–14.6)
WBC: 15.7 10*3/uL — ABNORMAL HIGH (ref 4.5–10.5)

## 2011-05-14 LAB — BASIC METABOLIC PANEL
BUN: 32 mg/dL — ABNORMAL HIGH (ref 6–23)
Calcium: 9.5 mg/dL (ref 8.4–10.5)
GFR: 34.72 mL/min — ABNORMAL LOW (ref >60.00)
Glucose, Bld: 103 mg/dL — ABNORMAL HIGH (ref 70–99)

## 2011-05-14 MED ORDER — DIPHENOXYLATE-ATROPINE 2.5-0.025 MG PO TABS: ORAL_TABLET | ORAL | Status: DC

## 2011-05-14 MED ORDER — METRONIDAZOLE 500 MG PO TABS: ORAL_TABLET | ORAL | Status: DC

## 2011-05-14 MED ORDER — LOSARTAN POTASSIUM 100 MG PO TABS: 100.0000 mg | ORAL_TABLET | Freq: Every day | ORAL | Status: DC

## 2011-05-14 NOTE — Patient Instructions (Signed)
Stay on clear liquids for 48-72 hours or until bowels are normal.This would include  jello, sherbert (NOT ice cream), Lipton's chicken noodle soup(NOT cream based soups),Gatorade Lite, flat Ginger ale (without High Fructose Corn Syrup),dry toast or crackers, baked potato.No milk , dairy or grease until bowels are formed.  Align , a [employer] , daily if stools are loose. Generic Lomotil  for frankly watery stool. Report increasing pain, fever or rectal bleeding Please take the probiotic , Align, every day until the bowels are normal. This will replace the normal bacteria which  are necessary for formation of normal stool and processing of food. Please  blowup at least 10  balloons a day to enhance inflation of the lungs and prevent atelectasis as we discussed.

## 2011-05-14 NOTE — Telephone Encounter (Signed)
Patient states she was seen today by Dr. Linna Darner and her oxygen and breathing machine should be canceled. [facility] called [facility] and they told patient they did not get anything from [place] authorizing that.

## 2011-05-14 NOTE — Telephone Encounter (Signed)
We sent it over by faxed 5 min ago, they will have on file soon

## 2011-05-14 NOTE — Progress Notes (Signed)
  Subjective:    Patient ID: Krystal Henderson, female    DOB: [birth year], 78 y.o.   MRN: [id number]  HPI She returns for followup of the hypoxemia and atelectasis. The office visit 2/2 and chest x-ray were reviewed and discussed. She was only able to take the Augmentin for 4 days because of severe diarrhea. She is weaning the high-dose prednisone and has 5 doses of 10 mg left. Codeine containing product was given her at the [facility] but she was unable to take this because of nausea and vomiting. She has a cough which is mainly dry but intermittently productive of some white sputum. She denies fever, chills, or sweats.  She has no past medical history of asthma or smoking. Her mother did have bronchitis    Review of Systems  The diarrhea persists up to 6 times a day as frankly watery stool.     Objective:   Physical Exam General appearance:good health ;well nourished; no acute distress or increased work of breathing is present.  No  lymphadenopathy about the head, neck, or axilla noted.   Eyes: No conjunctival inflammation or lid edema is present. There is no scleral icterus.  Ears:  External ear exam shows no significant lesions or deformities.  Otoscopic examination reveals some wax; tympanic membranes are intact bilaterally without bulging, retraction, inflammation or discharge.  Nose:  External nasal examination shows no deformity or inflammation. Nasal mucosa are pink and moist without lesions or exudates. No septal dislocation or deviation.No obstruction to airflow.   Oral exam: Dental hygiene is good; lips and gums are healthy appearing.There is no oropharyngeal erythema or exudate noted.   Neck:  No deformities, thyromegaly, masses, or tenderness noted.   Supple with full range of motion without pain.   Heart:  Normal rate and regular rhythm. S1 and S2 normal without gallop, murmur, click, rub .S 4 Lungs:Chest : no wheezes, rhonchi,or rubs present.No increased work of breathing.   Minor rales @ bases  Extremities:  No cyanosis, edema, or clubbing  noted    Skin: Warm & dry w/o jaundice or tenting.  Abdomen: bowel sounds hyperactive , soft and non-tender without masses, organomegaly or hernias noted.  No guarding or rebound         Assessment & Plan:  #1 bronchitis complicated by atelectasis and hypoxemia. Hypoxemia resolved ; oxygen can be discontinued  #2 severe diarrhea; rule out C. difficile colitis. Clinically no dehydration  #3 persistent cough; the ACE inhibitor could be playing a role in the persistent cough.  Plan: See orders and recommendations

## 2011-05-27 ENCOUNTER — Other Ambulatory Visit: Payer: Self-pay | Admitting: Neurology

## 2011-06-01 ENCOUNTER — Other Ambulatory Visit: Payer: Self-pay | Admitting: Internal Medicine

## 2011-06-28 ENCOUNTER — Other Ambulatory Visit: Payer: Self-pay | Admitting: Internal Medicine

## 2011-07-12 ENCOUNTER — Telehealth: Payer: Self-pay | Admitting: Internal Medicine

## 2011-07-12 NOTE — Telephone Encounter (Signed)
New Problem:    Patient wanted to know if she would still be eligible to see Dr. Haroldine Laws in the [facility].  Please let me know how to proceed.

## 2011-08-24 ENCOUNTER — Ambulatory Visit (INDEPENDENT_AMBULATORY_CARE_PROVIDER_SITE_OTHER): Payer: Medicare Other | Admitting: Internal Medicine

## 2011-08-24 ENCOUNTER — Ambulatory Visit (INDEPENDENT_AMBULATORY_CARE_PROVIDER_SITE_OTHER)
Admission: RE | Admit: 2011-08-24 | Discharge: 2011-08-24 | Disposition: A | Discharge status: Home / Self Care | Payer: Medicare Other | Source: Ambulatory Visit | Attending: Internal Medicine | Admitting: Internal Medicine

## 2011-08-24 ENCOUNTER — Encounter: Payer: Self-pay | Admitting: Internal Medicine

## 2011-08-24 VITALS — BP 118/70 | HR 72 | Temp 98.3°F | Resp 12 | Wt 172.6 lb

## 2011-08-24 DIAGNOSIS — E538 Deficiency of other specified B group vitamins: Secondary | ICD-10-CM

## 2011-08-24 DIAGNOSIS — Z01818 Encounter for other preprocedural examination: Secondary | ICD-10-CM

## 2011-08-24 DIAGNOSIS — M4807 Spinal stenosis, lumbosacral region: Secondary | ICD-10-CM

## 2011-08-24 DIAGNOSIS — R5381 Other malaise: Secondary | ICD-10-CM

## 2011-08-24 DIAGNOSIS — I10 Essential (primary) hypertension: Secondary | ICD-10-CM

## 2011-08-24 DIAGNOSIS — N289 Disorder of kidney and ureter, unspecified: Secondary | ICD-10-CM | POA: Insufficient documentation

## 2011-08-24 DIAGNOSIS — M48061 Spinal stenosis, lumbar region without neurogenic claudication: Secondary | ICD-10-CM

## 2011-08-24 DIAGNOSIS — M810 Age-related osteoporosis without current pathological fracture: Secondary | ICD-10-CM

## 2011-08-24 DIAGNOSIS — E782 Mixed hyperlipidemia: Secondary | ICD-10-CM

## 2011-08-24 DIAGNOSIS — E162 Hypoglycemia, unspecified: Secondary | ICD-10-CM

## 2011-08-24 LAB — BASIC METABOLIC PANEL
CO2: 27 mEq/L (ref 19–32)
Calcium: 8.9 mg/dL (ref 8.4–10.5)
Chloride: 107 mEq/L (ref 96–112)
Potassium: 4.6 mEq/L (ref 3.5–5.1)
Sodium: 141 mEq/L (ref 135–145)

## 2011-08-24 LAB — VITAMIN B12: Vitamin B-12: 270 pg/mL (ref 211–911)

## 2011-08-24 NOTE — Assessment & Plan Note (Signed)
BMD due

## 2011-08-24 NOTE — Assessment & Plan Note (Signed)
LDL was mildly elevated at 117.6, but the HDL was 62 which is protective on 03/15/11.

## 2011-08-24 NOTE — Progress Notes (Signed)
Subjective:    Patient ID: Krystal Henderson, female    DOB: 1932/07/19, 78 y.o.   MRN: [id number]  HPI Pre op evaluation: Surgical diagnosis:LS  spinal stenosis  Tentative surgical date/Surgeon: Surgery pending surgical clearance/Dr Jeffry Beane Severity: Up to a level of 8 on a 10 scale Pain: Pain is constant from the knees down both anteriorly and posteriorly when she is ambulating. Activity of daily living limitation/impairment of function: She must ambulate with a cane. She denies incontinence of urine or stool Treatment to date, efficacy: Epidural steroid injection x1 was of no benefit. Tramadol as needed has not been helpful either Past medical history/family history/social history were all reviewed and updated.see pertinent data.   Review of Systems HYPERTENSION: Disease Monitoring: Blood pressure average -118/57  Chest pain, palpitations- no      Dyspnea- no Medications: Compliance- yes Lightheadedness,Syncope- occasional postural symptoms when she arises too quickly   Edema- no  FASTING HYPERGLYCEMIA : Past medical history reactive hypoglycemia; fasting blood sugar 103 in February 2013 Disease Monitoring: Blood Sugar ranges-no monitor Polyuria/phagia/dipsia- no       Visual problems- intermittent blurred vision is related to Lyrica Hypoglycemic  symptoms- no  HYPERLIPIDEMIA: Disease Monitoring: See symptoms for Hypertension Medications: Compliance- on diet alone  She continues to have some fatigue; she was on B12 shots during 2012 from her gastroenterologist Fever/ chills : no  Night sweats: no                                                                                                                                                                       Hoarseness or swallowing dysfunction: no                                                                                         Bowel changes( constipation/ diarrhea): no                                                                                    Weight change: no  Cough: No; status post pneumonia 2/13 Hemoptysis: no  PND: no Melena/ rectal bleeding: no Adenopathy:no Severe snoring:no Skin / hair / nail changes: no Temperature intolerance( heat/ cold) : no                                                                                                   Feeling depressed:no Altered appetite: no Poor sleep/ Apnea : no Abnormal bruising / bleeding or enlarged lymph nodes: no         Objective:   Physical Exam Gen.: Healthy and well-nourished in appearance. Alert, appropriate and cooperative throughout exam.Appears younger than stated age  Head: Normocephalic without obvious abnormalities;no alopecia  Eyes: No corneal or conjunctival inflammation noted. Pupils equal round reactive to light and accommodation.  Extraocular motion intact.  Nose: External nasal exam reveals no deformity or inflammation. Nasal mucosa are pink and moist. No lesions or exudates noted. Septum  normal  Mouth: Oral mucosa and oropharynx reveal no lesions or exudates. Dentures Neck: No deformities, masses, or tenderness noted. Range of motion good. Thyroid normal. Lungs: Normal respiratory effort; chest expands symmetrically. Lungs are clear to auscultation without rales, wheezes, or increased work of breathing. Heart: Normal rate and rhythm. Normal S1 and S2. No gallop, click, or rub. S4 w/o murmur. Abdomen: Bowel sounds normal; abdomen soft and nontender. No masses, organomegaly or hernias noted.                               Musculoskeletal/extremities: Slight lordosis  noted of  the thoracic spine. No clubbing, cyanosis, edema. Minor DIP  deformities noted. Range of motion  normal .Tone & strength  normal. Nail health  good. Vascular: Carotid, radial artery,  and  posterior tibial pulses are full and equal. No bruits present. DPP slightly decreased Neurologic: Alert and oriented x3. Deep tendon reflexes symmetrical  and normal.          Skin: Intact without suspicious lesions or rashes. Lymph: No cervical, axillary lymphadenopathy present. Psych: Mood and affect are normal. Normally interactive                                                                                         Assessment & Plan:   #1 lumbosacral spinal stenosis with associated pain and significant impact on quality of life.  #2 problem list; see assessment and recommendations

## 2011-08-24 NOTE — Patient Instructions (Signed)
Order for x-rays entered into  the computer; these will be performed at [facility]. across from [facility]. No appointment is necessary. Share results with Dr Reather Littler office. Please try to go on My Chart within the next 24 hours to allow me to release the results directly to you.

## 2011-08-24 NOTE — Assessment & Plan Note (Signed)
Blood pressure well controlled; chemistries will be checked

## 2011-08-31 ENCOUNTER — Telehealth: Payer: Self-pay | Admitting: Internal Medicine

## 2011-08-31 NOTE — Telephone Encounter (Signed)
Per Dr.Hopper he sent a message to Dr.Bean informing him patient is cleared for surgery. I left message on VM for appointment coordinator to verify note from Dr.Hopper was received.

## 2011-08-31 NOTE — Telephone Encounter (Signed)
Pt states Dr. Reather Littler office needs her surgical clearance in writing. Fax# [phone]

## 2011-08-31 NOTE — Telephone Encounter (Signed)
Dr.Hopper addend patient's last OV note, Note was manually faxed to Dr.Bean's office

## 2011-08-31 NOTE — Progress Notes (Signed)
  Subjective:    Patient ID: Krystal Henderson, female    DOB: 1932-09-15, 78 y.o.   MRN: [id number]  HPI    Review of Systems     Objective:   Physical Exam        Assessment & Plan:    Through the Epic system; I notified Dr. Tonita Cong that Krystal Henderson was cleared for surgery based on the examination of 08/24/11 and review of labs.

## 2011-09-01 ENCOUNTER — Other Ambulatory Visit: Payer: Self-pay | Admitting: Otolaryngology

## 2011-09-08 ENCOUNTER — Encounter (HOSPITAL_COMMUNITY): Payer: Self-pay | Admitting: Pharmacy Technician

## 2011-09-08 ENCOUNTER — Encounter (HOSPITAL_COMMUNITY): Payer: Self-pay | Admitting: *Deleted

## 2011-09-09 NOTE — H&P (Signed)
Chief Complaint:  bilateral leg pain right greater than left   Subjective: Patient is admitted for L2-3, L3-4, possible L4-5 lumbar decompression  Patient is a 78 y.o. female who has been seen by Dr. Tonita Cong for ongoing back and LE pain  They have been followed and found to have continued progressive pain.  They have been treated conservatively in the past including medications.  Despite conservative measures, they continue to have pain.  X-rays show that the patient has severe spinal stenosis at multiple levels.  It is felt that they would benefit from undergoing surgical intervention.  Risks and benefits have been discussed with the patient and they elect to proceed with surgery.  The patient has no contraindications to the upcoming procedure such as ongoing infection or progressive neurological disease.  Allergies: The patient cannot tolerate hydrocodone or oxycodone Allergies  Allergen Reactions  . Codeine     REACTION: nausea  . Duloxetine     REACTION: intolerance  . Sertraline Hcl     REACTION: intolerance     Medications: Losartan Potassium (100MG  Tablet, Oral) Active. AmLODIPine Besylate (5MG  Tablet, Oral) Active. Furosemide (20MG  Tablet, Oral) Active. Lyrica (25MG  Capsule, Oral) Active. Lyrica (100MG  Capsule, Oral) Active.  Past Medical History: Past Medical History  Diagnosis Date  . HTN (hypertension)   . Hyperlipemia 2006    LDL 130  . Esophageal reflux     inactive  . Anxiety   . Hypoglycemia     reactive  . Peripheral neuropathy   . Vitamin B12 deficiency   . Personal history of colonic polyps     adenomatous  . Diverticulosis of colon (without mention of hemorrhage)   . DJD (degenerative joint disease)   . Osteoporosis   . Angiodysplasia 2007    @ colonoscopy  . Pneumonia     hx of pneumonia   . Anemia     hx of      Past Surgical History: Past Surgical History  Procedure Date  . Tear duct probing     X 2  . Total hip arthroplasty 2000    right    . Facial cosmetic surgery   . Tubal ligation   . Hemorroidectomy   . Dilation and curettage of uterus   . Colonoscopy w/ polypectomy     X 2 , Dr  Verl Blalock; angiodysplasia. Due 2022     Family History: Family History  Problem Relation Age of Onset  . Colon cancer Neg Hx   . Heart disease Father 62    MI  . Kidney disease Mother     renal failure  . Throat cancer Sister     smoker  . Osteoporosis Sister   . Heart attack Brother 80  . Stroke Brother 77    smoker  . Depression Maternal Uncle   . Cancer Brother     X3  lung cancer, all smokers  . Peripheral vascular disease Daughter     Social History: History  Substance Use Topics  . Smoking status: Never Smoker   . Smokeless tobacco: Never Used  . Alcohol Use: Yes     wine occasionally     Review of Systems A comprehensive review of systems was negative.  Physical Exam:  Vitals: Pulse:62 Respirations:10 Blood Pressure:152/61   General appearance: alert and cooperative Eyes: conjunctivae/corneas clear. PERRL, EOM's intact. Fundi benign. Neck: no adenopathy, no carotid bruit, no JVD, supple, symmetrical, trachea midline and thyroid not enlarged, symmetric, no tenderness/mass/nodules Lungs: clear to auscultation bilaterally  Heart: regular rate and rhythm, S1, S2 normal, no murmur, click, rub or gallop Abdomen: soft, non-tender; bowel sounds normal; no masses,  no organomegaly Extremities: +SLR bilaterally, no pain with ROM hips DP equal Skin: Skin color, texture, turgor normal. No rashes or lesions  Assessment/Plan: Severe multiple level spinal stenosis The patient is being admitted to [facility] to undergo a  Lumbar decompression L2-3, L3-4, possible L4-5.  Surgery will be performed by Dr. Susa Day.  Risks and benefits have been discussed with the patient and they elect to proceed wth the procedure.

## 2011-09-10 ENCOUNTER — Ambulatory Visit (HOSPITAL_COMMUNITY): Payer: Medicare Other

## 2011-09-10 ENCOUNTER — Encounter (HOSPITAL_COMMUNITY): Payer: Self-pay

## 2011-09-10 ENCOUNTER — Ambulatory Visit (HOSPITAL_COMMUNITY): Payer: Medicare Other | Admitting: Anesthesiology

## 2011-09-10 ENCOUNTER — Encounter (HOSPITAL_COMMUNITY): Payer: Self-pay | Admitting: Anesthesiology

## 2011-09-10 ENCOUNTER — Observation Stay (HOSPITAL_COMMUNITY)
Admission: RE | Admit: 2011-09-10 | Discharge: 2011-09-13 | Disposition: A | Discharge status: Home / Self Care | Payer: Medicare Other | Source: Ambulatory Visit | Attending: Specialist | Admitting: Specialist

## 2011-09-10 ENCOUNTER — Encounter (HOSPITAL_COMMUNITY)
Admission: RE | Disposition: A | Discharge status: Home / Self Care | Payer: Self-pay | Source: Ambulatory Visit | Attending: Specialist

## 2011-09-10 DIAGNOSIS — M5126 Other intervertebral disc displacement, lumbar region: Principal | ICD-10-CM | POA: Insufficient documentation

## 2011-09-10 DIAGNOSIS — M810 Age-related osteoporosis without current pathological fracture: Secondary | ICD-10-CM | POA: Insufficient documentation

## 2011-09-10 DIAGNOSIS — M48061 Spinal stenosis, lumbar region without neurogenic claudication: Secondary | ICD-10-CM | POA: Insufficient documentation

## 2011-09-10 DIAGNOSIS — I10 Essential (primary) hypertension: Secondary | ICD-10-CM | POA: Insufficient documentation

## 2011-09-10 DIAGNOSIS — Z79899 Other long term (current) drug therapy: Secondary | ICD-10-CM | POA: Insufficient documentation

## 2011-09-10 DIAGNOSIS — E785 Hyperlipidemia, unspecified: Secondary | ICD-10-CM | POA: Insufficient documentation

## 2011-09-10 HISTORY — PX: LUMBAR LAMINECTOMY/DECOMPRESSION MICRODISCECTOMY: SHX5026

## 2011-09-10 HISTORY — DX: Anemia, unspecified: D64.9

## 2011-09-10 LAB — COMPREHENSIVE METABOLIC PANEL
ALT: 15 U/L (ref 0–35)
AST: 16 U/L (ref 0–37)
Albumin: 3.6 g/dL (ref 3.5–5.2)
Alkaline Phosphatase: 55 U/L (ref 39–117)
Chloride: 105 mEq/L (ref 96–112)
Potassium: 4.6 mEq/L (ref 3.5–5.1)
Sodium: 144 mEq/L (ref 135–145)
Total Protein: 6.9 g/dL (ref 6.0–8.3)

## 2011-09-10 LAB — URINE MICROSCOPIC-ADD ON

## 2011-09-10 LAB — DIFFERENTIAL
Eosinophils Absolute: 0.1 10*3/uL (ref 0.0–0.7)
Eosinophils Relative: 2 % (ref 0–5)
Lymphs Abs: 1.6 10*3/uL (ref 0.7–4.0)
Monocytes Absolute: 0.5 10*3/uL (ref 0.1–1.0)
Monocytes Relative: 8 % (ref 3–12)
Neutrophils Relative %: 64 % (ref 43–77)

## 2011-09-10 LAB — CBC
HCT: 34.2 % — ABNORMAL LOW (ref 36.0–46.0)
Hemoglobin: 11 g/dL — ABNORMAL LOW (ref 12.0–15.0)
MCHC: 32.1 g/dL (ref 30.0–36.0)
MCV: 96.3 fL (ref 78.0–100.0)
Platelets: 232 10*3/uL (ref 150–400)
RBC: 3.55 MIL/uL — ABNORMAL LOW (ref 3.87–5.11)
RDW: 13.8 % (ref 11.5–15.5)
WBC: 6.2 10*3/uL (ref 4.0–10.5)
WBC: 5.9 10*3/uL (ref 4.0–10.5)

## 2011-09-10 LAB — URINALYSIS, ROUTINE W REFLEX MICROSCOPIC
Bilirubin Urine: NEGATIVE
Glucose, UA: NEGATIVE mg/dL
Hgb urine dipstick: NEGATIVE
Protein, ur: NEGATIVE mg/dL

## 2011-09-10 LAB — SURGICAL PCR SCREEN
MRSA, PCR: NEGATIVE
Staphylococcus aureus: NEGATIVE

## 2011-09-10 SURGERY — LUMBAR LAMINECTOMY/DECOMPRESSION MICRODISCECTOMY
Anesthesia: General | Wound class: Clean

## 2011-09-10 MED ORDER — CEFAZOLIN SODIUM 1-5 GM-% IV SOLN
INTRAVENOUS | Status: AC
  Filled 2011-09-10: qty 50

## 2011-09-10 MED ORDER — ONDANSETRON HCL 4 MG/2ML IJ SOLN
4.0000 mg | INTRAMUSCULAR | Status: DC | PRN
  Administered 2011-09-10: 4 mg via INTRAVENOUS
  Filled 2011-09-10: qty 2

## 2011-09-10 MED ORDER — PREGABALIN 50 MG PO CAPS
100.0000 mg | ORAL_CAPSULE | Freq: Every day | ORAL | Status: DC
  Administered 2011-09-10 – 2011-09-12 (×3): 100 mg via ORAL
  Filled 2011-09-10 (×3): qty 2

## 2011-09-10 MED ORDER — LACTATED RINGERS IV SOLN: INTRAVENOUS | Status: DC

## 2011-09-10 MED ORDER — SODIUM CHLORIDE 0.9 % IV SOLN: 250.0000 mL | INTRAVENOUS | Status: DC

## 2011-09-10 MED ORDER — PROMETHAZINE HCL 25 MG/ML IJ SOLN: 6.2500 mg | INTRAMUSCULAR | Status: DC | PRN

## 2011-09-10 MED ORDER — CIPROFLOXACIN IN D5W 400 MG/200ML IV SOLN
400.0000 mg | Freq: Two times a day (BID) | INTRAVENOUS | Status: AC
  Administered 2011-09-11 – 2011-09-12 (×3): 400 mg via INTRAVENOUS
  Filled 2011-09-10 (×3): qty 200

## 2011-09-10 MED ORDER — ONDANSETRON HCL 4 MG/2ML IJ SOLN
INTRAMUSCULAR | Status: DC | PRN
  Administered 2011-09-10: 4 mg via INTRAVENOUS

## 2011-09-10 MED ORDER — LIDOCAINE HCL (CARDIAC) 20 MG/ML IV SOLN
INTRAVENOUS | Status: DC | PRN
  Administered 2011-09-10: 100 mg via INTRAVENOUS

## 2011-09-10 MED ORDER — ACETAMINOPHEN 650 MG RE SUPP: 650.0000 mg | RECTAL | Status: DC | PRN

## 2011-09-10 MED ORDER — THROMBIN 5000 UNITS EX SOLR
CUTANEOUS | Status: AC
  Filled 2011-09-10: qty 10000

## 2011-09-10 MED ORDER — PROPOFOL 10 MG/ML IV EMUL
INTRAVENOUS | Status: DC | PRN
  Administered 2011-09-10: 140 mg via INTRAVENOUS

## 2011-09-10 MED ORDER — SODIUM CHLORIDE 0.45 % IV SOLN
INTRAVENOUS | Status: DC
  Administered 2011-09-10 – 2011-09-12 (×2): via INTRAVENOUS

## 2011-09-10 MED ORDER — CIPROFLOXACIN IN D5W 400 MG/200ML IV SOLN
INTRAVENOUS | Status: DC | PRN
  Administered 2011-09-10: 400 mg via INTRAVENOUS

## 2011-09-10 MED ORDER — ALPRAZOLAM 0.25 MG PO TABS
0.2500 mg | ORAL_TABLET | Freq: Three times a day (TID) | ORAL | Status: DC | PRN
  Administered 2011-09-11: 0.25 mg via ORAL
  Filled 2011-09-10: qty 1

## 2011-09-10 MED ORDER — DOCUSATE SODIUM 100 MG PO CAPS
100.0000 mg | ORAL_CAPSULE | Freq: Two times a day (BID) | ORAL | Status: DC
  Administered 2011-09-11 – 2011-09-13 (×5): 100 mg via ORAL

## 2011-09-10 MED ORDER — PHENOL 1.4 % MT LIQD: 1.0000 | OROMUCOSAL | Status: DC | PRN

## 2011-09-10 MED ORDER — SODIUM CHLORIDE 0.9 % IR SOLN
Status: DC | PRN
  Administered 2011-09-10: 15:00:00

## 2011-09-10 MED ORDER — MENTHOL 3 MG MT LOZG: 1.0000 | LOZENGE | OROMUCOSAL | Status: DC | PRN

## 2011-09-10 MED ORDER — PHENYLEPHRINE HCL 10 MG/ML IJ SOLN
10.0000 mg | INTRAVENOUS | Status: DC | PRN
  Administered 2011-09-10: 50 ug/min via INTRAVENOUS

## 2011-09-10 MED ORDER — MUPIROCIN 2 % EX OINT
TOPICAL_OINTMENT | CUTANEOUS | Status: AC
  Filled 2011-09-10: qty 22

## 2011-09-10 MED ORDER — BUPIVACAINE-EPINEPHRINE (PF) 0.5% -1:200000 IJ SOLN
INTRAMUSCULAR | Status: AC
  Filled 2011-09-10: qty 10

## 2011-09-10 MED ORDER — METOCLOPRAMIDE HCL 5 MG/ML IJ SOLN
10.0000 mg | Freq: Four times a day (QID) | INTRAMUSCULAR | Status: DC | PRN
  Administered 2011-09-10 – 2011-09-11 (×2): 10 mg via INTRAVENOUS
  Filled 2011-09-10 (×2): qty 2

## 2011-09-10 MED ORDER — ACETAMINOPHEN 325 MG PO TABS
650.0000 mg | ORAL_TABLET | ORAL | Status: DC | PRN
  Administered 2011-09-10 – 2011-09-11 (×2): 650 mg via ORAL
  Filled 2011-09-10 (×2): qty 2

## 2011-09-10 MED ORDER — THROMBIN 5000 UNITS EX SOLR
CUTANEOUS | Status: DC | PRN
  Administered 2011-09-10: 5000 [IU] via TOPICAL

## 2011-09-10 MED ORDER — SODIUM CHLORIDE 0.9 % IJ SOLN: 3.0000 mL | INTRAMUSCULAR | Status: DC | PRN

## 2011-09-10 MED ORDER — EPHEDRINE SULFATE 50 MG/ML IJ SOLN
INTRAMUSCULAR | Status: DC | PRN
  Administered 2011-09-10: 5 mg via INTRAVENOUS
  Administered 2011-09-10: 10 mg via INTRAVENOUS
  Administered 2011-09-10: 5 mg via INTRAVENOUS
  Administered 2011-09-10: 10 mg via INTRAVENOUS

## 2011-09-10 MED ORDER — PREGABALIN 25 MG PO CAPS
25.0000 mg | ORAL_CAPSULE | Freq: Two times a day (BID) | ORAL | Status: DC
  Administered 2011-09-11 – 2011-09-13 (×5): 25 mg via ORAL
  Filled 2011-09-10 (×5): qty 1

## 2011-09-10 MED ORDER — MEPERIDINE HCL 50 MG/ML IJ SOLN: 6.2500 mg | INTRAMUSCULAR | Status: DC | PRN

## 2011-09-10 MED ORDER — HEMOSTATIC AGENTS (NO CHARGE) OPTIME
TOPICAL | Status: DC | PRN
  Administered 2011-09-10: 1 via TOPICAL

## 2011-09-10 MED ORDER — ACETAMINOPHEN 10 MG/ML IV SOLN
INTRAVENOUS | Status: DC | PRN
  Administered 2011-09-10: 1000 mg via INTRAVENOUS

## 2011-09-10 MED ORDER — SODIUM CHLORIDE 0.9 % IJ SOLN: 3.0000 mL | Freq: Two times a day (BID) | INTRAMUSCULAR | Status: DC

## 2011-09-10 MED ORDER — CHLORHEXIDINE GLUCONATE 4 % EX LIQD
60.0000 mL | Freq: Once | CUTANEOUS | Status: DC
  Filled 2011-09-10: qty 60

## 2011-09-10 MED ORDER — CIPROFLOXACIN IN D5W 400 MG/200ML IV SOLN
INTRAVENOUS | Status: AC
  Filled 2011-09-10: qty 200

## 2011-09-10 MED ORDER — AMLODIPINE BESYLATE 5 MG PO TABS
5.0000 mg | ORAL_TABLET | Freq: Every day | ORAL | Status: DC
  Administered 2011-09-11 – 2011-09-13 (×3): 5 mg via ORAL
  Filled 2011-09-10 (×3): qty 1

## 2011-09-10 MED ORDER — HYDROMORPHONE HCL PF 1 MG/ML IJ SOLN
1.0000 mg | INTRAMUSCULAR | Status: DC | PRN
  Administered 2011-09-10 – 2011-09-11 (×4): 1 mg via INTRAVENOUS
  Filled 2011-09-10 (×4): qty 1

## 2011-09-10 MED ORDER — NEOSTIGMINE METHYLSULFATE 1 MG/ML IJ SOLN
INTRAMUSCULAR | Status: DC | PRN
  Administered 2011-09-10: 4 mg via INTRAVENOUS

## 2011-09-10 MED ORDER — CEFAZOLIN SODIUM 1-5 GM-% IV SOLN
1.0000 g | Freq: Three times a day (TID) | INTRAVENOUS | Status: AC
  Administered 2011-09-10 – 2011-09-11 (×2): 1 g via INTRAVENOUS
  Filled 2011-09-10 (×2): qty 50

## 2011-09-10 MED ORDER — DEXAMETHASONE SODIUM PHOSPHATE 4 MG/ML IJ SOLN
INTRAMUSCULAR | Status: DC | PRN
  Administered 2011-09-10: 8 mg via INTRAVENOUS

## 2011-09-10 MED ORDER — BUPIVACAINE-EPINEPHRINE 0.5% -1:200000 IJ SOLN
INTRAMUSCULAR | Status: DC | PRN
  Administered 2011-09-10: 17 mL

## 2011-09-10 MED ORDER — HYDROMORPHONE HCL PF 1 MG/ML IJ SOLN
0.2500 mg | INTRAMUSCULAR | Status: DC | PRN
  Administered 2011-09-10 (×2): 0.5 mg via INTRAVENOUS

## 2011-09-10 MED ORDER — HYDROMORPHONE HCL 2 MG PO TABS
4.0000 mg | ORAL_TABLET | Freq: Four times a day (QID) | ORAL | Status: DC | PRN
  Administered 2011-09-11 – 2011-09-13 (×5): 4 mg via ORAL
  Filled 2011-09-10 (×5): qty 2

## 2011-09-10 MED ORDER — SUFENTANIL CITRATE 50 MCG/ML IV SOLN
INTRAVENOUS | Status: DC | PRN
  Administered 2011-09-10: 10 ug via INTRAVENOUS
  Administered 2011-09-10: 5 ug via INTRAVENOUS
  Administered 2011-09-10: 15 ug via INTRAVENOUS

## 2011-09-10 MED ORDER — GLYCOPYRROLATE 0.2 MG/ML IJ SOLN
INTRAMUSCULAR | Status: DC | PRN
  Administered 2011-09-10: .6 mg via INTRAVENOUS

## 2011-09-10 MED ORDER — ACETAMINOPHEN 10 MG/ML IV SOLN
INTRAVENOUS | Status: AC
  Filled 2011-09-10: qty 100

## 2011-09-10 MED ORDER — LACTATED RINGERS IV SOLN
INTRAVENOUS | Status: DC | PRN
  Administered 2011-09-10 (×2): via INTRAVENOUS

## 2011-09-10 MED ORDER — CISATRACURIUM BESYLATE (PF) 10 MG/5ML IV SOLN
INTRAVENOUS | Status: DC | PRN
  Administered 2011-09-10: 10 mg via INTRAVENOUS
  Administered 2011-09-10: 4 mg via INTRAVENOUS
  Administered 2011-09-10: 2 mg via INTRAVENOUS

## 2011-09-10 MED ORDER — CEFAZOLIN SODIUM 1-5 GM-% IV SOLN
1.0000 g | INTRAVENOUS | Status: AC
  Administered 2011-09-10: 1 g via INTRAVENOUS

## 2011-09-10 MED ORDER — HYDROMORPHONE HCL PF 1 MG/ML IJ SOLN
INTRAMUSCULAR | Status: AC
  Filled 2011-09-10: qty 1

## 2011-09-10 SURGICAL SUPPLY — 52 items
APL SKNCLS STERI-STRIP NONHPOA (GAUZE/BANDAGES/DRESSINGS) ×2
BAG SPEC THK2 15X12 ZIP CLS (MISCELLANEOUS) ×1
BAG ZIPLOCK 12X15 (MISCELLANEOUS) ×2 IMPLANT
BENZOIN TINCTURE PRP APPL 2/3 (GAUZE/BANDAGES/DRESSINGS) ×4 IMPLANT
CHLORAPREP W/TINT 26ML (MISCELLANEOUS) IMPLANT
CLEANER TIP ELECTROSURG 2X2 (MISCELLANEOUS) ×2 IMPLANT
CLOTH BEACON ORANGE TIMEOUT ST (SAFETY) ×2 IMPLANT
DECANTER SPIKE VIAL GLASS SM (MISCELLANEOUS) ×2 IMPLANT
DRAPE MICROSCOPE LEICA (MISCELLANEOUS) ×2 IMPLANT
DRAPE POUCH INSTRU U-SHP 10X18 (DRAPES) ×2 IMPLANT
DRAPE SURG 17X11 SM STRL (DRAPES) ×2 IMPLANT
DRSG EMULSION OIL 3X3 NADH (GAUZE/BANDAGES/DRESSINGS) ×2 IMPLANT
DRSG PAD ABDOMINAL 8X10 ST (GAUZE/BANDAGES/DRESSINGS) ×2 IMPLANT
DRSG TELFA 4X5 ISLAND ADH (GAUZE/BANDAGES/DRESSINGS) IMPLANT
DURAPREP 26ML APPLICATOR (WOUND CARE) ×2 IMPLANT
ELECT REM PT RETURN 9FT ADLT (ELECTROSURGICAL) ×2
ELECTRODE REM PT RTRN 9FT ADLT (ELECTROSURGICAL) ×1 IMPLANT
GLOVE BIOGEL PI IND STRL 8 (GLOVE) ×1 IMPLANT
GLOVE BIOGEL PI INDICATOR 8 (GLOVE) ×1
GLOVE ECLIPSE 6.5 STRL STRAW (GLOVE) ×2 IMPLANT
GLOVE INDICATOR 6.5 STRL GRN (GLOVE) ×4 IMPLANT
GLOVE SURG SS PI 8.0 STRL IVOR (GLOVE) ×4 IMPLANT
GOWN PREVENTION PLUS LG XLONG (DISPOSABLE) ×2 IMPLANT
GOWN PREVENTION PLUS XLARGE (GOWN DISPOSABLE) ×2 IMPLANT
GOWN STRL REIN XL XLG (GOWN DISPOSABLE) ×2 IMPLANT
KIT BASIN OR (CUSTOM PROCEDURE TRAY) ×2 IMPLANT
KIT POSITIONING SURG ANDREWS (MISCELLANEOUS) ×2 IMPLANT
MANIFOLD NEPTUNE II (INSTRUMENTS) ×2 IMPLANT
NDL SPNL 18GX3.5 QUINCKE PK (NEEDLE) ×3 IMPLANT
NEEDLE SPNL 18GX3.5 QUINCKE PK (NEEDLE) ×6 IMPLANT
PATTIES SURGICAL .5 X.5 (GAUZE/BANDAGES/DRESSINGS) ×1 IMPLANT
PATTIES SURGICAL .75X.75 (GAUZE/BANDAGES/DRESSINGS) ×1 IMPLANT
PATTIES SURGICAL 1X1 (DISPOSABLE) IMPLANT
SPONGE GAUZE 4X4 12PLY (GAUZE/BANDAGES/DRESSINGS) ×1 IMPLANT
SPONGE SURGIFOAM ABS GEL 100 (HEMOSTASIS) ×2 IMPLANT
STAPLER VISISTAT (STAPLE) IMPLANT
STRIP CLOSURE SKIN 1/2X4 (GAUZE/BANDAGES/DRESSINGS) IMPLANT
SUT PROLENE 3 0 PS 2 (SUTURE) IMPLANT
SUT VIC AB 0 CT1 27 (SUTURE)
SUT VIC AB 0 CT1 27XBRD ANTBC (SUTURE) IMPLANT
SUT VIC AB 1 CT1 27 (SUTURE) ×4
SUT VIC AB 1 CT1 27XBRD ANTBC (SUTURE) ×1 IMPLANT
SUT VIC AB 1-0 CT2 27 (SUTURE) IMPLANT
SUT VIC AB 2-0 CT1 27 (SUTURE) ×4
SUT VIC AB 2-0 CT1 TAPERPNT 27 (SUTURE) ×1 IMPLANT
SUT VIC AB 2-0 CT2 27 (SUTURE) ×2 IMPLANT
SUT VICRYL 0 27 CT2 27 ABS (SUTURE) ×2 IMPLANT
SUT VICRYL 0 UR6 27IN ABS (SUTURE) IMPLANT
SYRINGE 10CC LL (SYRINGE) ×4 IMPLANT
TAPE CLOTH 4X10 WHT NS (GAUZE/BANDAGES/DRESSINGS) ×1 IMPLANT
TRAY LAMINECTOMY (CUSTOM PROCEDURE TRAY) ×2 IMPLANT
YANKAUER SUCT BULB TIP NO VENT (SUCTIONS) ×3 IMPLANT

## 2011-09-10 NOTE — Anesthesia Preprocedure Evaluation (Signed)
Anesthesia Evaluation  Patient identified by MRN, date of birth, ID band Patient awake    Reviewed: Allergy & Precautions, H&P , NPO status , Patient's Chart, lab work & pertinent test results  Airway Mallampati: II TM Distance: >3 FB Neck ROM: Full    Dental No notable dental hx. (+) Edentulous Upper and Edentulous Lower   Pulmonary neg pulmonary ROS,  breath sounds clear to auscultation  Pulmonary exam normal       Cardiovascular hypertension, Pt. on medications negative cardio ROS  Rhythm:Regular Rate:Normal     Neuro/Psych PSYCHIATRIC DISORDERS Anxiety  Neuromuscular disease negative neurological ROS  negative psych ROS   GI/Hepatic negative GI ROS, Neg liver ROS,   Endo/Other  negative endocrine ROS  Renal/GU negative Renal ROS  negative genitourinary   Musculoskeletal negative musculoskeletal ROS (+)   Abdominal   Peds negative pediatric ROS (+)  Hematology negative hematology ROS (+)   Anesthesia Other Findings   Reproductive/Obstetrics negative OB ROS                           Anesthesia Physical Anesthesia Plan  ASA: II  Anesthesia Plan: General   Post-op Pain Management:    Induction: Intravenous  Airway Management Planned: Oral ETT  Additional Equipment:   Intra-op Plan:   Post-operative Plan: Extubation in OR  Informed Consent: I have reviewed the patients History and Physical, chart, labs and discussed the procedure including the risks, benefits and alternatives for the proposed anesthesia with the patient or authorized representative who has indicated his/her understanding and acceptance.   Dental advisory given  Plan Discussed with: CRNA  Anesthesia Plan Comments:         Anesthesia Quick Evaluation

## 2011-09-10 NOTE — Anesthesia Procedure Notes (Signed)
Procedure Name: Intubation Date/Time: 09/10/2011 1:38 PM Performed by: Danley Danker L Patient Re-evaluated:Patient Re-evaluated prior to inductionOxygen Delivery Method: Circle system utilized Preoxygenation: Pre-oxygenation with 100% oxygen Intubation Type: IV induction Ventilation: Mask ventilation without difficulty and Oral airway inserted - appropriate to patient size Laryngoscope Size: Sabra Heck and 2 Grade View: Grade I Tube type: Oral Tube size: 7.5 mm Number of attempts: 1 Airway Equipment and Method: Stylet Placement Confirmation: ETT inserted through vocal cords under direct vision,  breath sounds checked- equal and bilateral and positive ETCO2 Secured at: 22 cm Tube secured with: Tape Dental Injury: Teeth and Oropharynx as per pre-operative assessment

## 2011-09-10 NOTE — Preoperative (Signed)
Beta Blockers   Reason not to administer Beta Blockers:Not Applicable 

## 2011-09-10 NOTE — Brief Op Note (Signed)
09/10/2011  4:17 PM  PATIENT:  [name]  78 y.o. female  PRE-OPERATIVE DIAGNOSIS:  stenosis  POST-OPERATIVE DIAGNOSIS:  spinal stenosis  PROCEDURE:  Procedure(s) (LRB): LUMBAR LAMINECTOMY/DECOMPRESSION MICRODISCECTOMY (N/A)  SURGEON:  Surgeon(s) and Role:    * Johnn Hai, MD - Primary    * Tobi Bastos, MD - Assisting  PHYSICIAN ASSISTANT:   ASSISTANTS: gioffree   ANESTHESIA:   general  EBL:  Total I/O In: 2300 [I.V.:2300] Out: 1000 [Urine:500; Blood:500]  BLOOD ADMINISTERED:none  DRAINS: none   LOCAL MEDICATIONS USED:  MARCAINE     SPECIMEN:  No Specimen  DISPOSITION OF SPECIMEN:  N/A  COUNTS:  YES  TOURNIQUET:  * No tourniquets in log *  DICTATION: .Other Dictation: Dictation Number [id number]  PLAN OF CARE: Admit to inpatient   PATIENT DISPOSITION:  PACU - hemodynamically stable.   Delay start of Pharmacological VTE agent (>24hrs) due to surgical blood loss or risk of bleeding: yes

## 2011-09-10 NOTE — Anesthesia Postprocedure Evaluation (Signed)
  Anesthesia Post-op Note  Patient: Krystal Henderson  Procedure(s) Performed: Procedure(s) (LRB): LUMBAR LAMINECTOMY/DECOMPRESSION MICRODISCECTOMY (N/A)  Patient Location: PACU  Anesthesia Type: General  Level of Consciousness: awake and alert   Airway and Oxygen Therapy: Patient Spontanous Breathing  Post-op Pain: mild  Post-op Assessment: Post-op Vital signs reviewed, Patient's Cardiovascular Status Stable, Respiratory Function Stable, Patent Airway and No signs of Nausea or vomiting  Post-op Vital Signs: stable  Complications: No apparent anesthesia complications \

## 2011-09-10 NOTE — Transfer of Care (Signed)
Immediate Anesthesia Transfer of Care Note  Patient: Krystal Henderson  Procedure(s) Performed: Procedure(s) (LRB): LUMBAR LAMINECTOMY/DECOMPRESSION MICRODISCECTOMY (N/A)  Patient Location: PACU  Anesthesia Type: General  Level of Consciousness: sedated, patient cooperative and responds to stimulaton  Airway & Oxygen Therapy: Patient Spontanous Breathing and Patient connected to face mask oxgen  Post-op Assessment: Report given to PACU RN and Post -op Vital signs reviewed and stable  Post vital signs: Reviewed and stable  Complications: No apparent anesthesia complications

## 2011-09-10 NOTE — Interval H&P Note (Signed)
History and Physical Interval Note:  09/10/2011 1:39 PM  Krystal Henderson  has presented today for surgery, with the diagnosis of stenosis  The various methods of treatment have been discussed with the patient and family. After consideration of risks, benefits and other options for treatment, the patient has consented to  Procedure(s) (LRB): LUMBAR LAMINECTOMY/DECOMPRESSION MICRODISCECTOMY (N/A) as a surgical intervention .  The patients' history has been reviewed, patient examined, no change in status, stable for surgery.  I have reviewed the patients' chart and labs.  Questions were answered to the patient's satisfaction.     Frederick Klinger C

## 2011-09-11 LAB — CBC
HCT: 31.5 % — ABNORMAL LOW (ref 36.0–46.0)
Hemoglobin: 10.3 g/dL — ABNORMAL LOW (ref 12.0–15.0)
MCHC: 32.7 g/dL (ref 30.0–36.0)
MCV: 95.7 fL (ref 78.0–100.0)

## 2011-09-11 LAB — BASIC METABOLIC PANEL
BUN: 15 mg/dL (ref 6–23)
Chloride: 99 mEq/L (ref 96–112)
Glucose, Bld: 179 mg/dL — ABNORMAL HIGH (ref 70–99)
Potassium: 4.5 mEq/L (ref 3.5–5.1)
Sodium: 133 mEq/L — ABNORMAL LOW (ref 135–145)

## 2011-09-11 NOTE — Care Management (Addendum)
Cm spoke with pt with spouse at the bedside concerning dc planning. Per pt choice [facility] to provide [facility] services upon discharge. Pt has access to Dme from previous surgery. Spouse to assist in home care. [facility] notified of new referral.   Arlean Hopping [phone]

## 2011-09-11 NOTE — Op Note (Signed)
NAMEHUMA, IMPELLIZZERI             ACCOUNT NO.:  [id number]  MEDICAL RECORD NO.:  [id number]  LOCATION:  [id number]                         FACILITY:  [facility]  PHYSICIAN:  Susa Day, M.D.    DATE OF BIRTH:  1933-02-06  DATE OF PROCEDURE:  09/10/2011 DATE OF DISCHARGE:                              OPERATIVE REPORT   PREOPERATIVE DIAGNOSIS:  Spinal stenosis and herniated nucleus pulposus; L2-3, 3-4, 4-5.  POSTOPERATIVE DIAGNOSIS:  Spinal stenosis and herniated nucleus pulposus; L2-3, 3-4, 4-5.  PROCEDURE PERFORMED:  Lumbar decompression, L2-3, 3-4, 4-5, right. Bilateral foraminotomies of L3-L4, and L5 on the right.  ANESTHESIA:  General.  ASSISTANT:  Kipp Brood. Gladstone Lighter, M.D.  HISTORY:  A 78 year old with severe pain secondary to neurogenic claudication, secondary to spinal stenosis at L2-3, extending to L3-4. Also disk herniation, lateral recess stenosis at L4-5.  Significant right-sided symptoms, positive neural tension signs, bilateral radiculopathy with prior conservative treatment indicated for decompression.  Risk and benefits were discussed including bleeding, infection, damage to vascular structures, no change in symptoms, worsening symptoms, need for repeat debridement, DVT, PE, anesthetic complications etc.  TECHNIQUE:  With the patient in a supine position.  After induction of adequate anesthesia, 2 g Kefzol and 600 mg pf Cipro, she had some white cells in her urine, no dysuria, the patient was placed prone on the [place] frame.  All bony prominences were well padded.  The hip was at 90 position.  She had a hip replacement in the past.  Care was taken to position the hip accordingly.  After the lumbar region was prepped, draped in the usual sterile fashion an 18-gauge spinal needle was utilized to localize the T3 and T5 spinous processes.  We prepped and draped the lumbar region in the usual sterile fashion.  After this we made an incision from the spinous process of L2  to below L5. Subcutaneous tissue was dissected.  Electrocautery was utilized to achieve hemostasis.  We had Marcaine with epinephrine in the subcutaneous tissues.  There was some fascia identified by the skin incision.  Paraspinous muscle was elevated from lamina L2-3, 3-4 and 4- 5.  McCullough retractor was placed.  Spinous processes were confirmed with Kocher, spinous processes of L2 and 3.  Bipolar cautery was utilized to achieve hemostasis.  Operating microscope was draped on the surgical field.  We removed the spinous processes of L2 and 3.  There was severe stenosis at L2-3, 3-4, on the right at L4-5.  We also removed the spinous process of L4 as well.  There was a fair amount of steady oozing from the bone during the decompression, we used bone wax throughout, thrombin-soaked Gelfoam as well as FloSeal.  After removing the spinous processes of L2-3 we used a 2 mm Kerrison to decompress L2-3 and remove it from the central laminectomy at L2 and L3.  She had underlying scoliosis.  She had severe stenosis laterally bilaterally and we decompressed the lateral recesses to the medial border of the pedicle.  She had stenosis at the foramen particularly at L3 bilaterally right greater than left, we entered the facet with a 2 mm Kerrison. Neuro patty was placed beneath the ligamentum flavum.  Bipolar cautery  was utilized to achieve hemostasis as was thrombin-soaked Gelfoam. After the lamina at L2 and 3 had been removed, the ligamentum flavum was hypertrophic between, it was removed and we continued our decompression. She had continued severe stenosis on the left at L3-4, moderately severe on the right.  We decompressed the lateral recess to the medial border of the pedicle and also removed the hemilamina of L4 on the right.  She had continued stenosis at L3-4 on the right and at L4-5 on the right. At L4-5 we decompressed the lateral recess to the medial border of the pedicle for performing  foraminotomies of L5 and L4, with the hard disk at L4-5 on the right and displaced the L5 root in the lateral recess based upon the ligamentum flavum hypertrophy and facet hypertrophy. Again this was decompressed to the medial border of the pedicle.  A probe was passed out to the foramen freely at L4-5.  Following the decompression we obtained confirmatory radiographs with probes in the foramen of L4 and 5 distally as well as in the foramen of L2 proximally. We checked the disk space, it was level at L2-3, 3-4.  There was no disk herniation either.  We spent some time achieving meticulous hemostasis, using irrigation as needed, electrocautery, bone wax, thrombin-soaked Gelfoam as well as FloSeal.  Following the decompression, there was good restoration of the thecal sac.  There was no CSF leakage or active bleeding noted.  Confirmatory radiographs were obtained.  We placed some small pieces of thrombin-soaked Gelfoam at L3-4 and 4-5, after it was copiously irrigated with antibiotic irrigation.  I removed the McCullough retractor.  Paraspinous muscles inspected and irrigated copiously.  No active bleeding.  We repaired the dorsal lumbar fascia with 1 Vicryl interrupted figure-of-eight sutures.  I placed FloSeal, the FloSeal was evacuated just prior to closure.  No active bleeding. Subcu with 2-0 Vicryl simple sutures,  skin was reapproximated with staples.  The fascia was distally opened approximately 1 cm in length. The skin was reapproximated with staples.  The wound was dressed sterilely.  She was placed supine on the hospital bed, extubated without difficulty, and transported to the recovery in satisfactory condition.  The patient tolerated the procedure well.  No complications.  Assistant was Dr. Gladstone Lighter, blood loss was 450 mL.     Susa Day, M.D.     Geralynn Rile  D:  09/10/2011  T:  09/11/2011  Job:  [id number]

## 2011-09-11 NOTE — Evaluation (Signed)
Physical Therapy Evaluation Patient Details Name: Krystal Henderson MRN: [id number] DOB: Jun 28, 1932 Today's Date: 09/11/2011 Time: [id number] PT Time Calculation (min): 22 min  PT Assessment / Plan / Recommendation Clinical Impression  pt will benefit from PT to maximize independence for home setting    PT Assessment  Patient needs continued PT services    Follow Up Recommendations  Home health PT    Barriers to Discharge None      lEquipment Recommendations  None recommended by PT;None recommended by OT    Recommendations for Other Services     Frequency 7X/week    Precautions / Restrictions Precautions Precautions: Back   Pertinent Vitals/Pain       Mobility  Bed Mobility Bed Mobility: Rolling Left;Left Sidelying to Sit Rolling Left: 4: Min guard;With rail Left Sidelying to Sit: HOB flat;4: Min assist Details for Bed Mobility Assistance: cues to log roll Transfers Transfers: Sit to Stand;Stand to Sit Sit to Stand: 4: Min assist;4: Min guard;From bed;From chair/3-in-1 Stand to Sit: 4: Min guard;4: Min assist;To bed;To chair/3-in-1 Details for Transfer Assistance: cues for hand placement Ambulation/Gait Ambulation/Gait Assistance: 4: Min assist Ambulation Distance (Feet): 20 Feet (11) Assistive device: Rolling walker Ambulation/Gait Assistance Details: cues for RW safety and direction, back precautions Gait Pattern: Step-to pattern;Decreased step length - right;Decreased step length - left    Exercises General Exercises - Lower Extremity Ankle Circles/Pumps: AROM;Both;15 reps   PT Diagnosis: Difficulty walking  PT Problem List: Decreased activity tolerance;Decreased balance;Decreased mobility;Decreased knowledge of use of DME;Decreased safety awareness;Decreased knowledge of precautions PT Treatment Interventions: DME instruction;Gait training;Stair training;Functional mobility training;Therapeutic activities;Patient/family education   PT Goals Acute Rehab PT  Goals PT Goal Formulation: With patient Time For Goal Achievement: 09/14/11 Potential to Achieve Goals: Good Pt will go Supine/Side to Sit: with supervision PT Goal: Supine/Side to Sit - Progress: Goal set today Pt will go Sit to Stand: with supervision PT Goal: Sit to Stand - Progress: Goal set today Pt will Ambulate: 51 - 150 feet;with supervision;with rolling walker PT Goal: Ambulate - Progress: Goal set today Pt will Go Up / Down Stairs: 3-5 stairs;with rail(s);with least restrictive assistive device;with min assist PT Goal: Up/Down Stairs - Progress: Goal set today  Visit Information  Last PT Received On: 09/11/11 Assistance Needed: +1    Subjective Data  Subjective: I'm weak Patient Stated Goal: return to PLOF   Prior [place] Lives With: Spouse Available Help at Discharge: Family;Available PRN/intermittently Type of Home: House Home Access: Stairs to enter Entrance Stairs-Number of Steps: 3 Entrance Stairs-Rails: Right Home Layout: Able to live on main level with bedroom/bathroom;Two level Bathroom Shower/Tub: Chiropodist: Standard Home Adaptive Equipment: Tub transfer bench;Grab bars in shower;Walker - rolling;Straight cane;Bedside commode/3-in-1;Walker - four wheeled Additional Comments: advised pt to use 2 wheeled walker initially at home Prior Function Level of Independence: Independent Able to Take Stairs?: Yes Driving: Yes Vocation: Retired Corporate investment banker: No difficulties    Cognition  Overall Cognitive Status: Appears within functional limits for tasks assessed/performed Arousal/Alertness: Other (comment) (groggy) Orientation Level: Appears intact for tasks assessed Behavior During Session: [facility] for tasks performed    Extremity/Trunk Assessment Right Upper Extremity Assessment RUE ROM/Strength/Tone: [facility] for tasks assessed Left Upper Extremity Assessment LUE ROM/Strength/Tone: [facility] for tasks assessed Right  Lower Extremity Assessment RLE ROM/Strength/Tone: [facility] for tasks assessed Left Lower Extremity Assessment LLE ROM/Strength/Tone: [facility] for tasks assessed (denies N/T)   Balance    End of Session PT - End of Session  Activity Tolerance: Treatment limited secondary to medical complications (Comment) (dizzy after amb 20') Patient left: in chair;with call bell/phone within reach Nurse Communication: Mobility status   [facility] 09/11/2011, 9:12 AM

## 2011-09-11 NOTE — Progress Notes (Signed)
Physical Therapy Treatment Patient Details Name: Krystal Henderson MRN: [id number] DOB: 1932/10/25 Today's Date: 09/11/2011 Time: [id number] PT Time Calculation (min): 18 min  PT Assessment / Plan / Recommendation Comments on Treatment Session  Fair tolerance to activity this pm session. Pt with reports of dizziness with sitting edge of bed. No change in 5-6 minutes of sitting with deep breathing and gaze stablilization. Attempted standing with no changes in dizziness reported. Pt returned to bed in left sidelying position. Reported decreased dizziness to "barely there".                           Follow Up Recommendations  Home health PT       Equipment Recommendations  None recommended by OT;None recommended by PT       Frequency 7X/week   Plan Discharge plan remains appropriate;Frequency remains appropriate    Precautions / Restrictions Precautions Precautions: Back Precaution Comments: Educated on 3/3 general back precautions for future injury prevention and to promote safer body mechanices/posture- no arching, bending or twisting. Restrictions Weight Bearing Restrictions: No       Mobility  Bed Mobility Rolling Left: 4: Min assist;With rail Left Sidelying to Sit: 4: Min assist;HOB flat Details for Bed Mobility Assistance: mod verbal/tactile cues needed for log roll technique to decrease pain and for back sparing techniques. Transfers Sit to Stand: 4: Min guard;From bed;With upper extremity assist Stand to Sit: 4: Min guard;To bed;With upper extremity assist Details for Transfer Assistance: stood and took 3-4 side steps to head of bed only due to pt with complaints of dizziness upon sitting edge of bed and not decreasing with standing up Ambulation/Gait Ambulation/Gait Assistance: 4: Min assist Ambulation Distance (Feet): 4 Feet Assistive device: Rolling walker Ambulation/Gait Assistance Details: 3-4 side steps only at edge of bed due to dizziness, no change in dizziness with  going from sitting to standing positions. Did report resolution of dizziness to "very little" with lying back down. Gait Pattern: Step-to pattern;Trunk flexed;Decreased step length - right;Decreased step length - left     PT Goals Acute Rehab PT Goals PT Goal: Supine/Side to Sit - Progress: Progressing toward goal PT Goal: Sit to Stand - Progress: Progressing toward goal  Visit Information  Last PT Received On: 09/11/11 Assistance Needed: +1    Subjective Data  Subjective: Reports feeling tired and weak today. Reports not being able to sleep well or eat much today.   Cognition  Overall Cognitive Status: Appears within functional limits for tasks assessed/performed Arousal/Alertness: Awake/alert Orientation Level: Appears intact for tasks assessed Behavior During Session: [facility] for tasks performed       End of Session PT - End of Session Equipment Utilized During Treatment: Gait belt Activity Tolerance: Patient limited by fatigue;Treatment limited secondary to medical complications (Comment) (dizziness with sitting up and standing, resolved with lying ) Patient left: in bed;with call bell/phone within reach Nurse Communication: Mobility status    Willow Ora 09/11/2011, 2:38 PM  Willow Ora, PTA Office- [phone]

## 2011-09-11 NOTE — Evaluation (Signed)
Occupational Therapy Evaluation Patient Details Name: Krystal Henderson MRN: [id number] DOB: [birth year] Today's Date: 09/11/2011 Time: 0840-0900 OT Time Calculation (min): 20 min  OT Assessment / Plan / Recommendation Clinical Impression  Pt presents s/p Lumbar decompression, L2-3, 3-4, 4-5, right. Skilled OT recommended to maximize I w/BADLs to supervision level in prep for d/c home with [facility].    OT Assessment  Patient needs continued OT Services    Follow Up Recommendations  Home health OT    Barriers to Discharge      Equipment Recommendations  None recommended by OT    Recommendations for Other Services    Frequency  Min 2X/week    Precautions / Restrictions Precautions Precautions: Back   Pertinent Vitals/Pain     ADL  Grooming: Simulated;Min guard Where Assessed - Grooming: Supported standing Upper Body Bathing: Simulated;Set up Where Assessed - Upper Body Bathing: Unsupported sitting Lower Body Bathing: Simulated;Minimal assistance Where Assessed - Lower Body Bathing: Supported sit to stand Upper Body Dressing: Simulated;Set up Where Assessed - Upper Body Dressing: Unsupported sitting Lower Body Dressing: Simulated;Minimal assistance Where Assessed - Lower Body Dressing: Sopported sit to stand Toilet Transfer: Performed;Minimal assistance Toilet Transfer Method: Sit to stand Toilet Transfer Equipment: Regular height toilet;Grab bars Toileting - Clothing Manipulation and Hygiene: Simulated;Minimal assistance Where Assessed - Best boy and Hygiene: Sit to stand from 3-in-1 or toilet ADL Comments: Discussed with pt the different techniques to get bathed and dressed. Pt has all AE from previous hip sx.    OT Diagnosis: Generalized weakness  OT Problem List: Decreased activity tolerance;Decreased safety awareness;Decreased knowledge of use of DME or AE;Pain;Decreased knowledge of precautions OT Treatment Interventions: Self-care/ADL  training;Therapeutic activities;DME and/or AE instruction;Patient/family education   OT Goals Acute Rehab OT Goals OT Goal Formulation: With patient Time For Goal Achievement: 09/25/11 Potential to Achieve Goals: Good ADL Goals Pt Will Perform Grooming: with supervision;Standing at sink ADL Goal: Grooming - Progress: Goal set today Pt Will Perform Lower Body Bathing: with supervision;Sit to stand from chair;Sit to stand from bed;with adaptive equipment ADL Goal: Lower Body Bathing - Progress: Goal set today Pt Will Perform Lower Body Dressing: with supervision;Sit to stand from chair;Sit to stand from bed;with adaptive equipment ADL Goal: Lower Body Dressing - Progress: Goal set today Pt Will Transfer to Toilet: with supervision;Maintaining back safety precautions;Regular height toilet;Ambulation ADL Goal: Toilet Transfer - Progress: Goal set today Pt Will Perform Toileting - Clothing Manipulation: with supervision;Standing;Sitting on 3-in-1 or toilet ADL Goal: Toileting - Clothing Manipulation - Progress: Goal set today Pt Will Perform Toileting - Hygiene: with supervision;Sit to stand from 3-in-1/toilet ADL Goal: Toileting - Hygiene - Progress: Goal set today Pt Will Perform Tub/Shower Transfer: with supervision;Tub transfer;Transfer tub bench;Maintaining back safety precautions ADL Goal: Tub/Shower Transfer - Progress: Goal set today Miscellaneous OT Goals Miscellaneous OT Goal #1: Pt will verbalize 3 back precautions during ADLs. OT Goal: Miscellaneous Goal #1 - Progress: Goal set today  Visit Information  Last OT Received On: 09/11/11 Assistance Needed: +1 PT/OT Co-Evaluation/Treatment: Yes    Subjective Data  Subjective: My neck hurts. Patient Stated Goal: Not asked.   Prior Functioning  Home Living Lives With: Spouse Available Help at Discharge: Family;Available PRN/intermittently Type of Home: House Home Access: Stairs to enter Entrance Stairs-Number of Steps:  3 Entrance Stairs-Rails: Right Home Layout: Able to live on main level with bedroom/bathroom;Two level Bathroom Shower/Tub: Chiropodist: Standard Home Adaptive Equipment: Tub transfer bench;Grab bars in shower;Walker - rolling;Straight cane;Bedside  commode/3-in-1;Walker - four wheeled Additional Comments: advised pt to use 2 wheeled walker initially at home Prior Function Level of Independence: Independent Able to Take Stairs?: Yes Driving: Yes Vocation: Retired Corporate investment banker: No difficulties    Cognition  Overall Cognitive Status: Appears within functional limits for tasks assessed/performed Arousal/Alertness: Other (comment) (groggy) Orientation Level: Appears intact for tasks assessed Behavior During Session: [facility] for tasks performed    Extremity/Trunk Assessment Right Upper Extremity Assessment RUE ROM/Strength/Tone: [facility] for tasks assessed Left Upper Extremity Assessment LUE ROM/Strength/Tone: WFL for tasks assessed Right Lower Extremity Assessment RLE ROM/Strength/Tone: [facility] for tasks assessed Left Lower Extremity Assessment LLE ROM/Strength/Tone: WFL for tasks assessed (denies N/T)   Mobility Bed Mobility Bed Mobility: Rolling Left;Left Sidelying to Sit Rolling Left: 4: Min guard;With rail Left Sidelying to Sit: 4: Min assist;HOB flat Details for Bed Mobility Assistance: min VCs for log roll. Transfers Sit to Stand: 4: Min assist;With upper extremity assist;From bed;From chair/3-in-1 Stand to Sit: 4: Min guard;4: Min assist;To chair/3-in-1 Details for Transfer Assistance: min VCs for hand placement.   Exercise General Exercises - Lower Extremity Ankle Circles/Pumps: AROM;Both;15 reps  Balance    End of Session OT - End of Session Equipment Utilized During Treatment: Gait belt Activity Tolerance: Patient tolerated treatment well Patient left: in chair;with call bell/phone within reach   [place] A OTR/L [phone] 09/11/2011,  9:38 AM

## 2011-09-11 NOTE — Progress Notes (Signed)
Awake/alert, Back "sore". Dressing with min. Drainage. Motor + Le's.  For PT today. Prob. Home w. Home health Sun/Mon.  Activity as ordered. H/H 10.3/31.5.

## 2011-09-12 MED ORDER — METHOCARBAMOL 500 MG PO TABS
500.0000 mg | ORAL_TABLET | Freq: Three times a day (TID) | ORAL | Status: DC | PRN
  Administered 2011-09-12: 500 mg via ORAL
  Filled 2011-09-12: qty 1

## 2011-09-12 MED ORDER — HYDROMORPHONE HCL 4 MG PO TABS: 2.0000 mg | ORAL_TABLET | ORAL | Status: AC | PRN

## 2011-09-12 NOTE — Progress Notes (Signed)
Subjective: Doing well,dressing changed and wound looks fine. Up in chair and no major complaints.   Objective: Vital signs in last 24 hours: Temp:  [97.3 F (36.3 C)-97.7 F (36.5 C)] 97.7 F (36.5 C) (06/09 0425) Pulse Rate:  [70-77] 70  (06/09 0425) Resp:  [14-20] 20  (06/09 0425) BP: (103-122)/(58-68) 109/61 mmHg (06/09 0425) SpO2:  [92 %-97 %] 97 % (06/09 0425)  Intake/Output from previous day: 06/08 0701 - 06/09 0700 In: 2105 [P.O.:480; I.V.:1425; IV Piggyback:200] Out: 3350 [Urine:3350] Intake/Output this shift:     Basename 09/11/11 0444 09/10/11 1617 09/10/11 1120  HGB 10.3* 11.0* 11.7*    Basename 09/11/11 0444 09/10/11 1617  WBC 9.8 5.9  RBC 3.29* 3.55*  HCT 31.5* 34.2*  PLT 285 232    Basename 09/11/11 0444 09/10/11 1120  NA 133* 144  K 4.5 4.6  CL 99 105  CO2 23 28  BUN 15 17  CREATININE 0.99 1.21*  GLUCOSE 179* 97  CALCIUM 8.7 9.6   No results found for this basename: LABPT:2,INR:2 in the last 72 hours  Dorsiflexion/Plantar flexion intact No cellulitis present  Assessment/Plan: DC Monday   Krystal Henderson 09/12/2011, 8:05 AM

## 2011-09-12 NOTE — Discharge Summary (Signed)
Patient ID: Krystal Henderson MRN: [id number] DOB/AGE: 09-13-1932 46 y.o.  Admit date: 2011-09-17 Discharge date: 09/12/2011  Admission Diagnoses: Spinal stenosis Active Problems:  * No active hospital problems. *    Discharge Diagnoses:  Same  Past Medical History  Diagnosis Date  . HTN (hypertension)   . Hyperlipemia 2006    LDL 130  . Esophageal reflux     inactive  . Anxiety   . Hypoglycemia     reactive  . Peripheral neuropathy   . Vitamin B12 deficiency   . Personal history of colonic polyps     adenomatous  . Diverticulosis of colon (without mention of hemorrhage)   . DJD (degenerative joint disease)   . Osteoporosis   . Angiodysplasia 2007    @ colonoscopy  . Pneumonia     hx of pneumonia   . Anemia     hx of     Surgeries: Procedure(s): LUMBAR LAMINECTOMY/DECOMPRESSION MICRODISCECTOMY on 09-17-2011   Consultants:  none  Discharged Condition: Improved  Hospital Course: Krystal Henderson is an 78 y.o. female who was admitted 2011/09/17 for operative treatment of<principal problem not specified>. Patient has severe unremitting pain that affects sleep, daily activities, and work/hobbies. After pre-op clearance the patient was taken to the operating room on 09/17/2011 and underwent  Procedure(s): LUMBAR LAMINECTOMY/DECOMPRESSION MICRODISCECTOMY.    Patient was given perioperative antibiotics: Anti-infectives     Start     Dose/Rate Route Frequency Ordered Stop   09/11/11 0200   ciprofloxacin (CIPRO) IVPB 400 mg        400 mg 200 mL/hr over 60 Minutes Intravenous Every 12 hours 09/17/11 1751 09/12/11 0249   17-Sep-2011 2200   ceFAZolin (ANCEF) IVPB 1 g/50 mL premix        1 g 100 mL/hr over 30 Minutes Intravenous Every 8 hours 17-Sep-2011 1751 09/11/11 0625   September 17, 2011 1432   polymyxin B 500,000 Units, bacitracin 50,000 Units in sodium chloride irrigation 0.9 % 500 mL irrigation  Status:  Discontinued          As needed 09-17-11 1433 2011-09-17 1610   September 17, 2011 1029    ceFAZolin (ANCEF) IVPB 1 g/50 mL premix        1 g 100 mL/hr over 30 Minutes Intravenous 60 min pre-op 09-17-11 1029 17-Sep-2011 1345           Patient was given sequential compression devices, early ambulation, and chemoprophylaxis to prevent DVT.  Patient benefited maximally from hospital stay and there were no complications.    Recent vital signs: Patient Vitals for the past 24 hrs:  BP Temp Temp src Pulse Resp SpO2  09/12/11 0936 123/65 mmHg - - 68  - 96 %  09/12/11 0425 109/61 mmHg 97.7 F (36.5 C) Oral 70  20  97 %  09/11/11 2115 122/68 mmHg 97.6 F (36.4 C) Oral 77  20  92 %  09/11/11 1406 103/58 mmHg 97.3 F (36.3 C) Oral 72  14  94 %     Recent laboratory studies:  Basename 09/11/11 0444 09/17/2011 1617 Sep 17, 2011 1120  WBC 9.8 5.9 --  HGB 10.3* 11.0* --  HCT 31.5* 34.2* --  PLT 285 232 --  NA 133* -- 144  K 4.5 -- 4.6  CL 99 -- 105  CO2 23 -- 28  BUN 15 -- 17  CREATININE 0.99 -- 1.21*  GLUCOSE 179* -- 97  INR -- -- --  CALCIUM 8.7 -- --     Discharge Medications:  Medication List  As of 09/12/2011  1:36 PM   TAKE these medications         ALPRAZolam 0.25 MG tablet   Commonly known as: XANAX   Take 0.5-1 tablet by mouth every 8-12 hours as needed      amLODipine 5 MG tablet   Commonly known as: NORVASC   take 1 tablet by mouth once daily (THIS REPLACES VERAPAMIL)      aspirin 81 MG tablet   Take 81 mg by mouth daily.      CALCIUM 600 PO   Take 1 capsule by mouth daily.      CENTRUM SILVER PO   Take 1 capsule by mouth daily.      Fish Oil 1000 MG Caps   Take 1 capsule by mouth daily.      furosemide 20 MG tablet   Commonly known as: LASIX   take 1 tablet by mouth EVERY MONDAY, WEDNESDAY, AND FRIDAY.      HYDROmorphone 4 MG tablet   Commonly known as: DILAUDID   Take 0.5 tablets (2 mg total) by mouth every 4 (four) hours as needed for pain.      losartan 100 MG tablet   Commonly known as: COZAAR   take 1 tablet by mouth once daily (REPLACES  QUINAPRIL)      meclizine 25 MG tablet   Commonly known as: ANTIVERT   Take .5-1 tablet by mouth once a day as needed for dizziness      pregabalin 100 MG capsule   Commonly known as: LYRICA   Take 100 mg by mouth at bedtime.      pregabalin 25 MG capsule   Commonly known as: LYRICA   Take 25 mg by mouth 2 (two) times daily. One in the morning and one at noon      vitamin C 1000 MG tablet   Take 1,000 mg by mouth daily.      Vitamin D 2000 UNITS Caps   Take 1 capsule by mouth daily.            Diagnostic Studies: Dg Chest 2 View  08/24/2011  *RADIOLOGY REPORT*  Clinical Data: Preoperative evaluation.  History of pneumonia.  CHEST - 2 VIEW  Comparison: Chest x-ray 05/08/2011  Findings: Lung volumes are normal.  No consolidative airspace disease.  No pleural effusions.  No pneumothorax.  No pulmonary nodule or mass noted.  Pulmonary vasculature and the cardiomediastinal silhouette are within normal limits. Atherosclerotic calcifications within the arch of the aorta. Mild bilateral apical pleuroparenchymal thickening is unchanged and presumably indicative of scarring from prior infection.  IMPRESSION: 1. No radiographic evidence of acute cardiopulmonary disease. 2.  Atherosclerosis.  Original Report Authenticated By: Etheleen Mayhew, M.D.   Dg Lumbar Spine 2-3 Views  09/10/2011  *RADIOLOGY REPORT*  Clinical Data: Preoperative films.  LUMBAR SPINE - 2-3 VIEW  Comparison: None.  Findings: Vertebral body height and alignment are maintained. There is loss of disc space height at all levels appearing worst from L3-4 to L5-S1.  Lower lumbar facet degenerative change is noted.  Mild convex right scoliosis is seen. Right total hip replacement is noted.  IMPRESSION: No acute finding.  Marked multilevel degenerative change.  Original Report Authenticated By: Arvid Right. Luther Parody, M.D.   Dg Spine Portable 1 View  09/10/2011  *RADIOLOGY REPORT*  Clinical Data: Discectomy.  Surgical levels L2-3, L3-4  and L4-5.  PORTABLE SPINE - 1 VIEW  Comparison: Intraoperative exams performed the same date. Preoperative  exam 09/10/2010.  Findings: Metallic probe posterior to the L5 vertebral body.  Surgical rakes posterior to the L4-5 and L2-3 disc space.  Metallic structures overlie the posterior aspect of the canal L2-L4 level which may be related to surgical sponges.  IMPRESSION: Metallic probe lower L5 level.  Surgical spreaders at the L2-3 and L4-5 level.  Linear metallic structures dorsal aspect of the canal L2-L4 level possibly related to surgical sponges.  Clinical correlation recommended.  Original Report Authenticated By: Doug Sou, M.D.   Dg Spine Portable 1 View  09/10/2011  *RADIOLOGY REPORT*  Clinical Data: Localization during lumbar surgery.  PORTABLE SPINE - 1 VIEW  Comparison: Film earlier today at 1419 hours  Findings: Cross-table lateral view at 1511 hours was obtained.  The same numbering system is used as the prior film.  Retractors are in place and metallic instruments project opposite the L2 and L4 levels.  IMPRESSION: Intraoperative localization at the L2 and L4 levels.  Original Report Authenticated By: Azzie Roup, M.D.   Dg Spine Portable 1 View  09/10/2011  *RADIOLOGY REPORT*  Clinical Data: L2-3 and L3-4 fusion.  PORTABLE SPINE - 1 VIEW  Comparison: 09/10/2011.  Findings: Based on level assignment from plain film examination performed 09/10/2011  11:40 a.m., superior metallic probe is at the L2 spinous process level and inferior metallic probe is at the L3 spinous process level.  Surgical sponges in place adjacent to the superior probe.  Correlation with any outside MR scan recommended to confirm this level assignment.  Nasogastric tube in place.  IMPRESSION: Localization L2 and L3 spinous process as noted above.  Original Report Authenticated By: Doug Sou, M.D.   Dg Spine Portable 1 View  09/10/2011  *RADIOLOGY REPORT*  Clinical Data: Spine surgery.  Please number levels.   PORTABLE SPINE - 1 VIEW  Comparison: Lumbar spine radiographs 09/10/2011  Findings: Numbering of the lumbar spine is per the lumbar spine radiographs performed earlier today.  A linear metallic instrument is directly posterior to the spinous process of the L1 vertebral body, near the L1-L2 disc space level.  A second linear metallic instrument is directly posterior to the spinous process of L2, at the L2-L3 disc level.  A nasogastric tube is noted.  IMPRESSION:  1.  The superior  metallic probe is posterior to the L1-2 disc space/spinous process of L1. 2.  The inferior metallic probe is posterior to the L2-3 disc space/spinous process of L2.  Original Report Authenticated By: Curlene Dolphin, M.D.    Disposition:   Discharge Orders    Future Appointments: Provider: Department: Dept Phone: Center:   03/15/2012 9:30 AM [name], MD [facility] [phone] [facility]      Follow-up Information    Follow up with Marquice Uddin C, MD in 10 days. (10-14 days)    Contact information:   [facility] [address], [place] [id number]           Signed: Johnn Hai 09/12/2011, 1:36 PM

## 2011-09-12 NOTE — Progress Notes (Signed)
Physical Therapy Treatment Patient Details Name: Krystal Henderson MRN: [id number] DOB: 1932/04/24 Today's Date: 09/12/2011 Time: 1030-1056 PT Time Calculation (min): 26 min  PT Assessment / Plan / Recommendation Comments on Treatment Session  pt with less dizziness today; states she barely remembers yesterday    Follow Up Recommendations  Home health PT    Barriers to Discharge        Equipment Recommendations  None recommended by OT;None recommended by PT    Recommendations for Other Services    Frequency 7X/week   Plan Discharge plan remains appropriate;Frequency remains appropriate    Precautions / Restrictions Precautions Precautions: Back Precaution Comments: pt instructed/reviewed back precautions   Pertinent Vitals/Pain BP 123/63 lying  BP 130/72 sitting after 1 minute    Mobility  Bed Mobility Rolling Left: 4: Min assist;With rail Left Sidelying to Sit: 4: Min assist;HOB flat Details for Bed Mobility Assistance: mod verbal/tactile cues needed for log roll technique to decrease pain and for back sparing techniques. Transfers Transfers: Sit to Stand;Stand to Sit Sit to Stand: 4: Min guard;With upper extremity assist;From bed;From toilet Stand to Sit: 4: Min guard;To toilet;To chair/3-in-1;With upper extremity assist Details for Transfer Assistance: cues for correct hand placement and posture Ambulation/Gait Ambulation/Gait Assistance: 4: Min guard;5: Supervision Ambulation Distance (Feet): 140 Feet (15') Assistive device: Rolling walker Ambulation/Gait Assistance Details: cues for posture, Rw distance from self Gait Pattern: Step-to pattern;Step-through pattern    Exercises     PT Diagnosis:    PT Problem List:   PT Treatment Interventions:     PT Goals Acute Rehab PT Goals Time For Goal Achievement: 09/14/11 Potential to Achieve Goals: Good Pt will go Supine/Side to Sit: with supervision PT Goal: Supine/Side to Sit - Progress: Progressing toward goal Pt  will go Sit to Stand: with supervision PT Goal: Sit to Stand - Progress: Progressing toward goal Pt will Ambulate: 51 - 150 feet;with supervision;with rolling walker PT Goal: Ambulate - Progress: Progressing toward goal  Visit Information  Last PT Received On: 09/12/11 Assistance Needed: +1    Subjective Data  Subjective: pt says she had a rough night, feels better than yesterday Patient Stated Goal: return to PLOF   Cognition  Overall Cognitive Status: Appears within functional limits for tasks assessed/performed Arousal/Alertness: Awake/alert Orientation Level: Appears intact for tasks assessed Behavior During Session: [facility] for tasks performed    Balance     End of Session PT - End of Session Activity Tolerance: Patient tolerated treatment well Patient left: in chair;with call bell/phone within reach    [facility] 09/12/2011, 10:59 AM

## 2011-09-12 NOTE — Discharge Instructions (Signed)
Walk As Tolerated utilizing back precautions.  No bending, twisting, or lifting.  No driving for 2 weeks.  Ok to shower in 72 hours.

## 2011-09-12 NOTE — Progress Notes (Signed)
Occupational Therapy Treatment Patient Details Name: SRISTI OLLILA MRN: [id number] DOB: [birth year] Today's Date: 09/12/2011 Time: [id number] OT Time Calculation (min): 27 min  OT Assessment / Plan / Recommendation Comments on Treatment Session Pt progressing well. Husband present for session.    Follow Up Recommendations  Home health OT    Barriers to Discharge       Equipment Recommendations  None recommended by OT;None recommended by PT    Recommendations for Other Services    Frequency     Plan Discharge plan remains appropriate    Precautions / Restrictions Precautions Precautions: Back Precaution Comments: Pt able to recall 2/3 back precautions. Re-educated pt on all back precautions and how these apply to all aspects of ADLs/IADLs Restrictions Weight Bearing Restrictions: No   Pertinent Vitals/Pain Pt reports "discomfort" in back but did not rate. Repositioned    ADL  Grooming: Simulated;Wash/dry hands;Wash/dry face;Supervision/safety Where Assessed - Grooming: Unsupported standing Lower Body Dressing: Simulated;Modified independent;Set up Where Assessed - Lower Body Dressing: Unsupported sit to stand Toilet Transfer: Performed;Min guard Toilet Transfer Method: Sit to Loss adjuster, chartered: Bedside commode Toileting - Clothing Manipulation and Hygiene: Simulated;Min guard Where Assessed - Best boy and Hygiene: Standing Tub/Shower Transfer: Simulated;Minimal assistance Tub/Shower Transfer Method: Therapist, art: Walk in shower Equipment Used: Rolling walker Transfers/Ambulation Related to ADLs: Min guard/Supervision with RW ambulation throughout room.  ADL Comments: Pt and husband educated on safe tub/shower and walk-in shower transfers. Pt worried she would not remember education, but husband able to verbalize back to therapist the correct methods.     OT Diagnosis:    OT Problem List:   OT Treatment  Interventions:     OT Goals ADL Goals ADL Goal: Grooming - Progress: Progressing toward goals ADL Goal: Lower Body Bathing - Progress: Progressing toward goals ADL Goal: Lower Body Dressing - Progress: Progressing toward goals ADL Goal: Toilet Transfer - Progress: Progressing toward goals ADL Goal: Toileting - Clothing Manipulation - Progress: Progressing toward goals ADL Goal: Toileting - Hygiene - Progress: Progressing toward goals ADL Goal: Tub/Shower Transfer - Progress: Progressing toward goals Miscellaneous OT Goals OT Goal: Miscellaneous Goal #1 - Progress: Progressing toward goals  Visit Information  Last OT Received On: 09/12/11 Assistance Needed: +1    Subjective Data      Prior Functioning       Cognition  Overall Cognitive Status: Appears within functional limits for tasks assessed/performed Arousal/Alertness: Awake/alert Orientation Level: Appears intact for tasks assessed Behavior During Session: [facility] for tasks performed    Mobility Bed Mobility Bed Mobility: Supine to Sit;Sit to Supine Rolling Left: 4: Min guard;With rail Left Sidelying to Sit: 4: Min assist;With rails Details for Bed Mobility Assistance: Pt unable to recall any education on log rolling into and out of the bed. Pt able to perform supine <-> sit without breaking precautions by pivoting hips to EOB Transfers Sit to Stand: 4: Min guard;With upper extremity assist;From bed;From toilet Stand to Sit: 4: Min guard;To toilet;To chair/3-in-1;With upper extremity assist Details for Transfer Assistance: good hand placement   Exercises    Balance    End of Session OT - End of Session Equipment Utilized During Treatment: Gait belt Activity Tolerance: Patient tolerated treatment well Patient left: with call bell/phone within reach;in bed;with family/visitor present   Praise Stennett 09/12/2011, 6:11 PM

## 2011-09-13 ENCOUNTER — Encounter (HOSPITAL_COMMUNITY): Payer: Self-pay | Admitting: Specialist

## 2011-09-13 NOTE — Progress Notes (Signed)
Physical Therapy Treatment Patient Details Name: Krystal Henderson MRN: [id number] DOB: 23-Jun-1932 Today's Date: 09/13/2011 Time: 1010-1028 PT Time Calculation (min): 18 min  PT Assessment / Plan / Recommendation Comments on Treatment Session  Pt plans to D/C to home today.    Follow Up Recommendations  Home health PT    Barriers to Discharge        Equipment Recommendations       Recommendations for Other Services    Frequency 7X/week   Plan Discharge plan remains appropriate    Precautions / Restrictions   BAT Bending Arching Twisting   Pertinent Vitals/Pain C/o 4/10 back pain    Mobility  Bed Mobility Left Sidelying to Sit: 4: Min guard Details for Bed Mobility Assistance: 75% VC's on proper "log roll" tech and increased time Transfers Transfers: Sit to Stand;Stand to Sit Sit to Stand: 5: Supervision;From bed Stand to Sit: 5: Supervision;To bed Details for Transfer Assistance: increased time Ambulation/Gait Ambulation/Gait Assistance: 5: Supervision Ambulation Distance (Feet): 85 Feet Assistive device: Rolling walker Ambulation/Gait Assistance Details: 25% VC's on safety with turns Gait Pattern: Step-through pattern;Decreased stride length;Shuffle Gait velocity: slow Stairs: Yes Stairs Assistance: 4: Min guard Stair Management Technique: One rail Right Number of Stairs: 2  Wheelchair Mobility Wheelchair Mobility: No    PT Goals             Progressing     Visit Information  Last PT Received On: 09/13/11 Assistance Needed: +1    Subjective Data      Cognition       Balance     End of Session PT - End of Session Equipment Utilized During Treatment: Gait belt Activity Tolerance: Patient tolerated treatment well Patient left: in bed;with call bell/phone within reach Nurse Communication: Other (comment) (pt ready for D/C to home)   Rica Koyanagi  PTA [facility]  Acute  Rehab Pager     [phone]

## 2011-09-13 NOTE — Progress Notes (Signed)
CARE MANAGEMENT NOTE 09/13/2011  Patient:  Krystal Henderson, Krystal Henderson   Account Number:  [id number]  Date Initiated:  09/11/2011  Documentation initiated by:  DAVIS,TYMEEKA  Subjective/Objective Assessment:   78 yo female admitted with spinal stenosis.     Action/Plan:   Home when stable   Anticipated DC Date:  09/12/2011   Anticipated DC Plan:  [place] referral  NA      DC Planning Services  CM consult      [facility] Choice  HOME HEALTH   Choice offered to / List presented to:  C-1 Patient   DME arranged  NA      DME agency  NA     [place] arranged  HH-2 PT      [place].   Status of service:  Completed, signed off Medicare Important Message given?  NO (If response is "NO", the following Medicare IM given date fields will be blank) Date Medicare IM given:   Date Additional Medicare IM given:    Discharge Disposition:  [place]  Per UR Regulation:  Reviewed for med. necessity/level of care/duration of stay  Comments:  09/13/2011 [place] [phone] Pt for discharge today. [facility] will provide [facility] services with start of services tomorrow 09/14/2011

## 2011-09-13 NOTE — Progress Notes (Signed)
Subjective: 3 Days Post-Op Procedure(s) (LRB): LUMBAR LAMINECTOMY/DECOMPRESSION MICRODISCECTOMY (N/A) Patient reports pain as 3 on 0-10 scale.    Objective: Vital signs in last 24 hours: Temp:  [97.3 F (36.3 C)-99.2 F (37.3 C)] 99 F (37.2 C) (06/10 0500) Pulse Rate:  [68-84] 84  (06/10 0500) Resp:  [16] 16  (06/10 0500) BP: (99-123)/(65-73) 119/73 mmHg (06/10 0500) SpO2:  [94 %-96 %] 94 % (06/10 0500)  Intake/Output from previous day: 06/09 0701 - 06/10 0700 In: 770 [P.O.:770] Out: 2950 [Urine:2950] Intake/Output this shift:     Basename 09/11/11 0444 09/10/11 1617 09/10/11 1120  HGB 10.3* 11.0* 11.7*    Basename 09/11/11 0444 09/10/11 1617  WBC 9.8 5.9  RBC 3.29* 3.55*  HCT 31.5* 34.2*  PLT 285 232    Basename 09/11/11 0444 09/10/11 1120  NA 133* 144  K 4.5 4.6  CL 99 105  CO2 23 28  BUN 15 17  CREATININE 0.99 1.21*  GLUCOSE 179* 97  CALCIUM 8.7 9.6   No results found for this basename: LABPT:2,INR:2 in the last 72 hours  Neurologically intact ABD soft Sensation intact distally Intact pulses distally Incision: dressing C/D/I and no drainage  Assessment/Plan: 3 Days Post-Op Procedure(s) (LRB): LUMBAR LAMINECTOMY/DECOMPRESSION MICRODISCECTOMY (N/A) Discharge home with home health  Krystal Henderson C 09/13/2011, 7:02 AM

## 2011-11-08 ENCOUNTER — Encounter: Payer: Self-pay | Admitting: Internal Medicine

## 2011-11-08 ENCOUNTER — Ambulatory Visit (INDEPENDENT_AMBULATORY_CARE_PROVIDER_SITE_OTHER): Payer: Medicare Other | Admitting: Internal Medicine

## 2011-11-08 VITALS — BP 122/80 | HR 67 | Temp 98.3°F | Wt 175.6 lb

## 2011-11-08 DIAGNOSIS — I10 Essential (primary) hypertension: Secondary | ICD-10-CM

## 2011-11-08 DIAGNOSIS — R259 Unspecified abnormal involuntary movements: Secondary | ICD-10-CM

## 2011-11-08 DIAGNOSIS — R251 Tremor, unspecified: Secondary | ICD-10-CM

## 2011-11-08 DIAGNOSIS — N289 Disorder of kidney and ureter, unspecified: Secondary | ICD-10-CM

## 2011-11-08 DIAGNOSIS — R600 Localized edema: Secondary | ICD-10-CM

## 2011-11-08 DIAGNOSIS — R609 Edema, unspecified: Secondary | ICD-10-CM

## 2011-11-08 LAB — POTASSIUM: Potassium: 4.8 mEq/L (ref 3.5–5.1)

## 2011-11-08 LAB — CREATININE, SERUM: Creatinine, Ser: 1.2 mg/dL (ref 0.4–1.2)

## 2011-11-08 MED ORDER — FUROSEMIDE 20 MG PO TABS: 20.0000 mg | ORAL_TABLET | Freq: Every day | ORAL | Status: DC

## 2011-11-08 NOTE — Progress Notes (Signed)
  Subjective:    Patient ID: Krystal Henderson, female    DOB: 1933-01-20, 78 y.o.   MRN: [id number]  HPI She describes the edema in both feet, worse on the right. She questions whether this is been worse since her laminectomy in June of this year. She denies increased intake of sodium She is on furosemide 20 mg Monday, Wednesday, and Friday. She is also on amlodipine and losartan. She denies chest pain, palpitations, paroxysmal nocturnal dyspnea, or claudication. She has had mild renal insufficiency by history.    Review of Systems She has had some tremor but worse with intentional ask for at least,. She denies excess intake of stimulants such as decongestants, diet pills, nicotine, or caffeine. She denies family history of tremor     Objective:   Physical Exam Gen.: Healthy and well-nourished in appearance. Alert, appropriate and cooperative throughout exam.  Eyes: No corneal or conjunctival inflammation noted. No proptosis or lid lag Neck: No deformities, masses, or tenderness noted. No NVD @ 15 degrees Lungs: Normal respiratory effort; chest expands symmetrically. Lungs are clear to auscultation without rales, wheezes, or increased work of breathing. Heart: Normal rate and rhythm. Normal S1 and S2. No gallop, click, or rub. S4 w/o murmur. Abdomen: Bowel sounds normal; abdomen soft and nontender. No masses, organomegaly or hernias noted.  Musculoskeletal/extremities:  No clubbing, cyanosis,  or deformity noted. Range of motion  normal .Nail health  Good.Trace -1/2 + edema; R > L Vascular: Carotid, radial artery, dorsalis pedis and  posterior tibial pulses are full and equal. No bruits present. Neurologic: Alert and oriented x3. Deep tendon reflexes symmetrical and normal.          Skin: Intact without suspicious lesions or rashes. Lymph: No cervical, axillary lymphadenopathy present. Psych: Mood and affect are normal. Normally interactive                                                                                          Assessment & Plan:  #1 edema, subjectively worse since her surgery 6/13. Furosemide will be increased to 20 mg daily. If edema persists; consideration should be given to discontinuing amlodipine.  #2 benign intention tremor; she should avoid stimulants. If symptoms persist or progress nonselective beta blockade would be considered  #3 see updated concerning hypertension and renal function

## 2011-11-08 NOTE — Assessment & Plan Note (Addendum)
By report blood pressure is well controlled at home with an average of 120/80. The edema could be related to amlodipine. Additionally renal function is to be rechecked. She is on low-dose intermittent diuretic. We'll assess response to 20 mg daily.

## 2011-11-08 NOTE — Patient Instructions (Addendum)
Your BP goal = AVERAGE < 135/85. Avoid ingestion of  excess salt/sodium.Cook with pepper & other spices . Use the salt substitute "No Salt"(unless your potassium has been elevated) OR the Mrs Deliah Boston products to season food @ the table. Avoid foods which taste salty or "vinegary" as their sodium contentet will be high. Assess response of the edema to taking furosemide 20 mg daily. If there is no improvement; please call To prevent tremor, avoid stimulants such as decongestants, diet pills, nicotine, or caffeine (coffee, tea, cola, or chocolate) to excess.  Please try to go on My Chart within the next 24 hours to allow me to release the results directly to you.

## 2011-11-08 NOTE — Assessment & Plan Note (Signed)
09/2011: GFR 54, BUN 15, creatinine 0.99

## 2011-11-30 ENCOUNTER — Ambulatory Visit (INDEPENDENT_AMBULATORY_CARE_PROVIDER_SITE_OTHER)
Admission: RE | Admit: 2011-11-30 | Discharge: 2011-11-30 | Disposition: A | Discharge status: Home / Self Care | Payer: Medicare Other | Source: Ambulatory Visit

## 2011-11-30 DIAGNOSIS — M810 Age-related osteoporosis without current pathological fracture: Secondary | ICD-10-CM

## 2011-12-08 ENCOUNTER — Other Ambulatory Visit: Payer: Self-pay | Admitting: Internal Medicine

## 2011-12-08 NOTE — Telephone Encounter (Signed)
The medication is " as needed" and should not be taken on a regular basis. Regular use can increase risk by affecting  level of alertness and balance with increased risk of falling. #30, NR

## 2011-12-08 NOTE — Telephone Encounter (Signed)
Dr.Hopper please advise, last OV 11/08/11

## 2011-12-23 ENCOUNTER — Ambulatory Visit (INDEPENDENT_AMBULATORY_CARE_PROVIDER_SITE_OTHER): Payer: Medicare Other | Admitting: Internal Medicine

## 2011-12-23 ENCOUNTER — Encounter: Payer: Self-pay | Admitting: Internal Medicine

## 2011-12-23 VITALS — BP 138/80 | HR 70 | Temp 97.6°F | Wt 175.4 lb

## 2011-12-23 DIAGNOSIS — D649 Anemia, unspecified: Secondary | ICD-10-CM

## 2011-12-23 DIAGNOSIS — M4800 Spinal stenosis, site unspecified: Secondary | ICD-10-CM

## 2011-12-23 DIAGNOSIS — M949 Disorder of cartilage, unspecified: Secondary | ICD-10-CM

## 2011-12-23 DIAGNOSIS — M8580 Other specified disorders of bone density and structure, unspecified site: Secondary | ICD-10-CM | POA: Insufficient documentation

## 2011-12-23 DIAGNOSIS — R2681 Unsteadiness on feet: Secondary | ICD-10-CM

## 2011-12-23 DIAGNOSIS — R269 Unspecified abnormalities of gait and mobility: Secondary | ICD-10-CM

## 2011-12-23 DIAGNOSIS — G609 Hereditary and idiopathic neuropathy, unspecified: Secondary | ICD-10-CM

## 2011-12-23 DIAGNOSIS — R42 Dizziness and giddiness: Secondary | ICD-10-CM

## 2011-12-23 NOTE — Progress Notes (Signed)
  Subjective:    Patient ID: Krystal Henderson, female    DOB: April 24, 1932, 78 y.o.   MRN: [id number]  HPI Symptoms began 2 weeks ago acutely while she was walking in her kitchen. She had been out of bed for at least 20 minutes. Suddenly she became dizzy without associated vertigo. The symptoms lasted almost all day.  She had recurrence 12/20/11 as she sat up in bed. That episode lasted approximately half a day. Meclizine was of some benefit. She describes gait instability  There was no associated frank vertigo. She has not been having benign positional vertigo symptoms in bed  Past medical history/family history/social history were all reviewed and updated. She has a past history of peripheral neuropathy, anemia, LS surgery for spinal stenosis and reactive hypoglycemia.            Review of Systems  There was no cardiac prodrome of palpitations, irregular rhythm, or heart rate change prior to this episode. Also she denies any neurologic prodrome such as headache, numbness and tingling,or limb weakness prior to the episode.  She denies frank syncope or near-syncope. She's had no seizure stigmata.  The symptoms were not associated with any signs of rhinosinusitis such as frontal headache, facial pain, or purulent nasal secretions.  She did not have associated blurred vision, double vision, or loss of vision. There is no hearing loss or tinnitus. With the acute episode she did not experience chest pain, nausea, shortness breath, or diaphoresis.     Objective:   Physical Exam  Gen. appearance: Well-nourished, in no distress.Appears younger than stated age  Head: Normocephalic without abnormalities Eyes: Extraocular motion intact, field of vision normal,  no nystagmus. Vision with lenses is normal on  the right; she has long-standing vision loss on the left. ENT: Canals : some wax but  tympanic membranes normal, tuning fork exam normal, hearing grossly normal Neck: Decreased cervical range of  motion; no masses Cardiovascular: Rate and rhythm normal; no murmurs, gallops or extra heart sounds Muscle skeletal:  &  strength normal Neuro:no cranial nerve deficit, deep tendon  reflexes normal, gait is deliberate & slightly broad based but there's a slight footdrop on the left. She has had lumbosacral surgery. Lymph: No cervical or axillary LA Skin: Warm and dry without suspicious lesions or rashes Psych: no anxiety or mood change. Normally interactive and cooperative.         Assessment & Plan:  #1 acute dizziness with gait imbalance. Because of her significant past medical history; labs are indicated. If these are negative; physical therapy recommended. Isometrics prior to standing encourage

## 2011-12-23 NOTE — Patient Instructions (Addendum)
Repeat the isometric exercises discussed 4- 5 times prior to standing if you've been seated for a period of time.

## 2011-12-24 LAB — CBC WITH DIFFERENTIAL/PLATELET
Basophils Absolute: 0 10*3/uL (ref 0.0–0.1)
Eosinophils Relative: 1.5 % (ref 0.0–5.0)
HCT: 39.6 % (ref 36.0–46.0)
Hemoglobin: 12.7 g/dL (ref 12.0–15.0)
Lymphocytes Relative: 30.8 % (ref 12.0–46.0)
Lymphs Abs: 2.3 10*3/uL (ref 0.7–4.0)
Monocytes Relative: 6.1 % (ref 3.0–12.0)
Platelets: 278 10*3/uL (ref 150.0–400.0)
RDW: 15.4 % — ABNORMAL HIGH (ref 11.5–14.6)
WBC: 7.4 10*3/uL (ref 4.5–10.5)

## 2011-12-24 LAB — VITAMIN D 25 HYDROXY (VIT D DEFICIENCY, FRACTURES): Vit D, 25-Hydroxy: 58 ng/mL (ref 30–89)

## 2011-12-28 ENCOUNTER — Encounter: Payer: Self-pay | Admitting: Internal Medicine

## 2011-12-29 ENCOUNTER — Encounter: Payer: Self-pay | Admitting: Internal Medicine

## 2011-12-30 ENCOUNTER — Emergency Department (HOSPITAL_COMMUNITY): Payer: Medicare Other

## 2011-12-30 ENCOUNTER — Inpatient Hospital Stay (HOSPITAL_COMMUNITY)
Admission: EM | Admit: 2011-12-30 | Discharge: 2012-01-03 | DRG: 563 | Disposition: A | Discharge status: Skilled Nursing Facility | Payer: Medicare Other | Attending: Internal Medicine | Admitting: Internal Medicine

## 2011-12-30 ENCOUNTER — Encounter (HOSPITAL_COMMUNITY): Payer: Self-pay | Admitting: Emergency Medicine

## 2011-12-30 DIAGNOSIS — D649 Anemia, unspecified: Secondary | ICD-10-CM

## 2011-12-30 DIAGNOSIS — E785 Hyperlipidemia, unspecified: Secondary | ICD-10-CM | POA: Diagnosis present

## 2011-12-30 DIAGNOSIS — I10 Essential (primary) hypertension: Secondary | ICD-10-CM | POA: Diagnosis present

## 2011-12-30 DIAGNOSIS — S0180XA Unspecified open wound of other part of head, initial encounter: Secondary | ICD-10-CM | POA: Diagnosis present

## 2011-12-30 DIAGNOSIS — I129 Hypertensive chronic kidney disease with stage 1 through stage 4 chronic kidney disease, or unspecified chronic kidney disease: Secondary | ICD-10-CM | POA: Diagnosis present

## 2011-12-30 DIAGNOSIS — Y9229 Other specified public building as the place of occurrence of the external cause: Secondary | ICD-10-CM

## 2011-12-30 DIAGNOSIS — Z860100 Personal history of colon polyps, unspecified: Secondary | ICD-10-CM

## 2011-12-30 DIAGNOSIS — Z885 Allergy status to narcotic agent status: Secondary | ICD-10-CM

## 2011-12-30 DIAGNOSIS — S5290XA Unspecified fracture of unspecified forearm, initial encounter for closed fracture: Secondary | ICD-10-CM | POA: Diagnosis present

## 2011-12-30 DIAGNOSIS — G609 Hereditary and idiopathic neuropathy, unspecified: Secondary | ICD-10-CM | POA: Diagnosis present

## 2011-12-30 DIAGNOSIS — Z888 Allergy status to other drugs, medicaments and biological substances status: Secondary | ICD-10-CM

## 2011-12-30 DIAGNOSIS — S322XXA Fracture of coccyx, initial encounter for closed fracture: Secondary | ICD-10-CM | POA: Diagnosis present

## 2011-12-30 DIAGNOSIS — S3210XA Unspecified fracture of sacrum, initial encounter for closed fracture: Secondary | ICD-10-CM | POA: Diagnosis present

## 2011-12-30 DIAGNOSIS — Z8719 Personal history of other diseases of the digestive system: Secondary | ICD-10-CM

## 2011-12-30 DIAGNOSIS — Z96649 Presence of unspecified artificial hip joint: Secondary | ICD-10-CM

## 2011-12-30 DIAGNOSIS — N183 Chronic kidney disease, stage 3 unspecified: Secondary | ICD-10-CM | POA: Diagnosis present

## 2011-12-30 DIAGNOSIS — W010XXA Fall on same level from slipping, tripping and stumbling without subsequent striking against object, initial encounter: Secondary | ICD-10-CM | POA: Diagnosis present

## 2011-12-30 DIAGNOSIS — S0181XA Laceration without foreign body of other part of head, initial encounter: Secondary | ICD-10-CM | POA: Diagnosis present

## 2011-12-30 DIAGNOSIS — N179 Acute kidney failure, unspecified: Secondary | ICD-10-CM | POA: Diagnosis not present

## 2011-12-30 DIAGNOSIS — Z79899 Other long term (current) drug therapy: Secondary | ICD-10-CM

## 2011-12-30 DIAGNOSIS — Z23 Encounter for immunization: Secondary | ICD-10-CM

## 2011-12-30 DIAGNOSIS — N289 Disorder of kidney and ureter, unspecified: Secondary | ICD-10-CM

## 2011-12-30 DIAGNOSIS — S329XXA Fracture of unspecified parts of lumbosacral spine and pelvis, initial encounter for closed fracture: Secondary | ICD-10-CM | POA: Diagnosis present

## 2011-12-30 DIAGNOSIS — Z7982 Long term (current) use of aspirin: Secondary | ICD-10-CM

## 2011-12-30 DIAGNOSIS — M810 Age-related osteoporosis without current pathological fracture: Secondary | ICD-10-CM | POA: Diagnosis present

## 2011-12-30 DIAGNOSIS — S52599A Other fractures of lower end of unspecified radius, initial encounter for closed fracture: Principal | ICD-10-CM | POA: Diagnosis present

## 2011-12-30 DIAGNOSIS — F411 Generalized anxiety disorder: Secondary | ICD-10-CM | POA: Diagnosis present

## 2011-12-30 DIAGNOSIS — M1990 Unspecified osteoarthritis, unspecified site: Secondary | ICD-10-CM | POA: Diagnosis present

## 2011-12-30 HISTORY — DX: Fracture of unspecified parts of lumbosacral spine and pelvis, initial encounter for closed fracture: S32.9XXA

## 2011-12-30 LAB — CBC
HCT: 33.1 % — ABNORMAL LOW (ref 36.0–46.0)
Hemoglobin: 11.1 g/dL — ABNORMAL LOW (ref 12.0–15.0)
MCH: 30.1 pg (ref 26.0–34.0)
MCHC: 33.5 g/dL (ref 30.0–36.0)
MCV: 89.7 fL (ref 78.0–100.0)
Platelets: 283 10*3/uL (ref 150–400)
RBC: 3.69 MIL/uL — ABNORMAL LOW (ref 3.87–5.11)
RDW: 14.7 % (ref 11.5–15.5)
WBC: 8.8 10*3/uL (ref 4.0–10.5)

## 2011-12-30 LAB — BASIC METABOLIC PANEL
BUN: 18 mg/dL (ref 6–23)
CO2: 24 mEq/L (ref 19–32)
Calcium: 8.7 mg/dL (ref 8.4–10.5)
Chloride: 102 mEq/L (ref 96–112)
Creatinine, Ser: 1.22 mg/dL — ABNORMAL HIGH (ref 0.50–1.10)
GFR calc Af Amer: 48 mL/min — ABNORMAL LOW (ref >90)
GFR calc non Af Amer: 42 mL/min — ABNORMAL LOW (ref >90)
Glucose, Bld: 116 mg/dL — ABNORMAL HIGH (ref 70–99)
Potassium: 4.6 mEq/L (ref 3.5–5.1)
Sodium: 135 mEq/L (ref 135–145)

## 2011-12-30 MED ORDER — PROPOFOL 10 MG/ML IV BOLUS
200.0000 mg | Freq: Once | INTRAVENOUS | Status: AC
  Administered 2011-12-30: 30 mg via INTRAVENOUS
  Filled 2011-12-30: qty 1

## 2011-12-30 MED ORDER — KETAMINE HCL 10 MG/ML IJ SOLN
200.0000 mg | Freq: Once | INTRAMUSCULAR | Status: AC
  Administered 2011-12-30: 30 mg via INTRAVENOUS
  Filled 2011-12-30: qty 20

## 2011-12-30 MED ORDER — ONDANSETRON HCL 4 MG/2ML IJ SOLN
4.0000 mg | Freq: Once | INTRAMUSCULAR | Status: AC
  Administered 2011-12-31: 4 mg via INTRAVENOUS
  Filled 2011-12-30: qty 2

## 2011-12-30 MED ORDER — FENTANYL CITRATE 0.05 MG/ML IJ SOLN
50.0000 ug | Freq: Once | INTRAMUSCULAR | Status: AC
  Administered 2011-12-30: 50 ug via INTRAVENOUS
  Filled 2011-12-30: qty 2

## 2011-12-30 MED ORDER — ONDANSETRON HCL 4 MG/2ML IJ SOLN
4.0000 mg | Freq: Once | INTRAMUSCULAR | Status: AC
  Administered 2011-12-30: 4 mg via INTRAVENOUS

## 2011-12-30 MED ORDER — IBUPROFEN 200 MG PO TABS
400.0000 mg | ORAL_TABLET | Freq: Once | ORAL | Status: AC
  Administered 2011-12-31: 400 mg via ORAL
  Filled 2011-12-30: qty 2

## 2011-12-30 MED ORDER — MORPHINE SULFATE 4 MG/ML IJ SOLN
4.0000 mg | Freq: Once | INTRAMUSCULAR | Status: AC
  Administered 2011-12-31: 4 mg via INTRAVENOUS
  Filled 2011-12-30: qty 1

## 2011-12-30 MED ORDER — ONDANSETRON HCL 4 MG/2ML IJ SOLN
INTRAMUSCULAR | Status: AC
  Filled 2011-12-30: qty 2

## 2011-12-30 MED ORDER — PREGABALIN 50 MG PO CAPS
100.0000 mg | ORAL_CAPSULE | Freq: Once | ORAL | Status: AC
  Administered 2011-12-31: 100 mg via ORAL
  Filled 2011-12-30: qty 2

## 2011-12-30 NOTE — ED Notes (Signed)
Attempted to ambulate patient. Patient unable to walk due to pain. Standing was ok.

## 2011-12-30 NOTE — ED Notes (Signed)
30mg  of ketamine/30mg  of propofol

## 2011-12-30 NOTE — ED Notes (Signed)
Pt has returned to baseline. VSS. A&Ox's 4.

## 2011-12-30 NOTE — ED Notes (Signed)
Wrist reduced. Splint applied by ortho.

## 2011-12-30 NOTE — ED Notes (Signed)
Steward sedation scale 6.

## 2011-12-30 NOTE — ED Provider Notes (Signed)
History    78 year old female with right hip and right wrist pain after mechanical fall. Patient was at [place] for her grandson's birthday and was caring 2 soft drinks when she lost her balance and fell. She fell onto her right side. Persistent pain since. She did strike her head. No loss of consciousness. Denies significant headache or neck pain. No blood thinning medications aside from 81 mg of aspirin. No acute visual changes. At her baseline mental status per family at bedside. Patient with no acute numbness, tingling or loss of strength.  CSN: [id number]  Arrival date & time 12/30/11  1634   First MD Initiated Contact with Patient 12/30/11 1723      Chief Complaint  Patient presents with  . Fall  . Laceration    (Consider location/radiation/quality/duration/timing/severity/associated sxs/prior treatment) HPI  Past Medical History  Diagnosis Date  . HTN (hypertension)   . Hyperlipemia 2006    LDL 130  . Esophageal reflux     inactive  . Anxiety   . Hypoglycemia     reactive  . Peripheral neuropathy   . Vitamin B12 deficiency   . Personal history of colonic polyps     adenomatous  . Diverticulosis of colon (without mention of hemorrhage)   . DJD (degenerative joint disease)   . Osteoporosis   . Angiodysplasia 2007    @ colonoscopy  . Pneumonia     hx of pneumonia   . Anemia     hx of     Past Surgical History  Procedure Date  . Tear duct probing     X 2  . Total hip arthroplasty 2000    right  . Facial cosmetic surgery   . Tubal ligation   . Hemorroidectomy   . Dilation and curettage of uterus   . Colonoscopy w/ polypectomy     X 2 , Dr  Verl Blalock; angiodysplasia. Due 2022  . Lumbar laminectomy/decompression microdiscectomy 09/10/2011    Procedure: LUMBAR LAMINECTOMY/DECOMPRESSION MICRODISCECTOMY;  Surgeon: Johnn Hai, MD;  Location: [facility] ORS;  Service: Orthopedics;  Laterality: N/A;  decompression laminectomy L2-3, L3-4, L4-5    Family  History  Problem Relation Age of Onset  . Colon cancer Neg Hx   . Diabetes Neg Hx   . Heart disease Father 101    MI  . Kidney disease Mother     renal failure  . Throat cancer Sister     smoker  . Osteoporosis Sister   . Heart attack Brother 70  . Stroke Brother 14    smoker  . Depression Maternal Uncle   . Cancer Brother     X3  lung cancer, all smokers  . Peripheral vascular disease Daughter     History  Substance Use Topics  . Smoking status: Never Smoker   . Smokeless tobacco: Never Used  . Alcohol Use: Yes     wine occasionally    OB History    Grav Para Term Preterm Abortions TAB SAB Ect Mult Living                  Review of Systems   Review of symptoms negative unless otherwise noted in HPI.   Allergies  Codeine; Duloxetine; and Sertraline hcl  Home Medications   Current Outpatient Rx  Name Route Sig Dispense Refill  . ALPRAZOLAM 0.25 MG PO TABS Oral Take 0.5-1 mg by mouth at bedtime as needed. Anxiety    . AMLODIPINE BESYLATE 5 MG PO TABS  Oral Take 5 mg by mouth daily.    [name] VITAMIN C 1000 MG PO TABS Oral Take 1,000 mg by mouth daily.     . ASPIRIN 81 MG PO TABS Oral Take 81 mg by mouth daily.     [name] CALCIUM 600 PO Oral Take 1 capsule by mouth daily.     [name] VITAMIN D 2000 UNITS PO CAPS Oral Take 1 capsule by mouth daily.     . FUROSEMIDE 20 MG PO TABS Oral Take 20 mg by mouth daily.    [name] LOSARTAN POTASSIUM 100 MG PO TABS Oral Take 100 mg by mouth daily.    [name] MECLIZINE HCL 25 MG PO TABS Oral Take 12.5 mg by mouth daily.    . ADULT MULTIVITAMIN W/MINERALS CH Oral Take 1 tablet by mouth daily.    [name] FISH OIL 1000 MG PO CAPS Oral Take 1 capsule by mouth daily.     [name] PREGABALIN 100 MG PO CAPS Oral Take 100 mg by mouth at bedtime.     [name] PREGABALIN 25 MG PO CAPS Oral Take 25 mg by mouth 2 (two) times daily. One in the morning and one at noon      BP 112/76  Pulse 68  Temp 97.4 F (36.3 C) (Oral)  Resp 20  SpO2 100%  Physical Exam  Nursing note and  vitals reviewed. Constitutional: She is oriented to person, place, and time. She appears well-developed and well-nourished. No distress.  HENT:  Head: Normocephalic.       Laceration to the area of the right superior/lateral periorbital region. No active bleeding. Stellate and approximately 2 cm in total length.  Eyes: Conjunctivae normal are normal. Right eye exhibits no discharge. Left eye exhibits no discharge.  Neck: Neck supple.  Cardiovascular: Normal rate, regular rhythm and normal heart sounds.  Exam reveals no gallop and no friction rub.   No murmur heard. Pulmonary/Chest: Effort normal and breath sounds normal. No respiratory distress.  Abdominal: Soft. She exhibits no distension. There is no tenderness.  Musculoskeletal: She exhibits no edema and no tenderness.       Deformity to her right wrist. Diffusely tender. Incidentally noted ganglion cyst. Superficial abrasions at the flexor crease of the wrist. Neurovascular intact distally. Able to actively range her right hip, although with markedly increased pain. Neurovascular intact distally. No midline spinal tenderness.  Neurological: She is alert and oriented to person, place, and time. No cranial nerve deficit. She exhibits normal muscle tone. Coordination normal.  Skin: Skin is warm and dry.  Psychiatric: She has a normal mood and affect. Her behavior is normal. Thought content normal.    ED Course  Reduction of fracture Date/Time: 12/30/2011 10:48 PM Performed by: Virgel Manifold Authorized by: Virgel Manifold Consent: Verbal consent obtained. Written consent obtained. Risks and benefits: risks, benefits and alternatives were discussed Consent given by: patient Required items: required blood products, implants, devices, and special equipment available Patient identity confirmed: verbally with patient, arm band and provided demographic data Time out: Immediately prior to procedure a "time out" was called to verify the correct  patient, procedure, equipment, support staff and site/side marked as required. Local anesthesia used: no Patient sedated: yes Sedatives: propofol and ketamine Analgesia: fentanyl Vitals: Vital signs were monitored during sedation. Patient tolerance: Patient tolerated the procedure well with no immediate complications. Comments: R wrist fx reduced. Pt tolerated well. NVI post-reduction.  Procedural sedation Date/Time: 12/30/2011 10:50 PM Performed by: Virgel Manifold Authorized by: Virgel Manifold Consent: Verbal  consent obtained. Written consent obtained. Risks and benefits: risks, benefits and alternatives were discussed Consent given by: patient Patient identity confirmed: verbally with patient, arm band and provided demographic data Time out: Immediately prior to procedure a "time out" was called to verify the correct patient, procedure, equipment, support staff and site/side marked as required. Local anesthesia used: no Patient sedated: yes Sedatives: ketamine, propofol and see MAR for details Analgesia: fentanyl Vitals: Vital signs were monitored during sedation. Patient tolerance: Patient tolerated the procedure well with no immediate complications. Comments: 30mg  propofol and 30mg  ketamine. Remained HD stable through out procedure.  LACERATION REPAIR Date/Time: 12/30/2011 10:54 PM Performed by: Virgel Manifold Authorized by: Virgel Manifold Consent: Verbal consent obtained. Risks and benefits: risks, benefits and alternatives were discussed Patient identity confirmed: verbally with patient, arm band and provided demographic data Body area: head/neck Location details: right eyelid Laceration length: 2.5 cm Anesthesia: local infiltration Local anesthetic: lidocaine 1% without epinephrine Anesthetic total: 2 ml Patient sedated: no Preparation: Patient was prepped and draped in the usual sterile fashion. Irrigation solution: saline Amount of cleaning: standard Debridement:  none Number of sutures: 5 Technique: simple Approximation: close Patient tolerance: Patient tolerated the procedure well with no immediate complications. Comments: 6-0 fast absorbing suture. 5 simple interrupted placed.    (including critical care time)   Labs Reviewed  CBC  BASIC METABOLIC PANEL  URINALYSIS, MICROSCOPIC ONLY   Dg Wrist Complete Right  12/30/2011  *RADIOLOGY REPORT*  Clinical Data: Right wrist fracture status post reduction.  RIGHT WRIST - COMPLETE 3+ VIEW  Comparison: 12/30/2011.  Findings: Compared to the prior examination there has been interval closed reduction and cast fixation for the distal radial and ulnar fractures.  This cast now obscures fine bony detail.  However, there is significantly improved alignment, which is now near anatomic.  The highly comminuted intra-articular fracture of the distal right radius in particular is markedly improved.  A small ulnar styloid avulsion fracture.  IMPRESSION: 1.  Status post closed reduction and cast fixation for distal radial and ulnar fractures was significantly improved alignment, as above.   Original Report Authenticated By: Etheleen Mayhew, M.D.    Dg Wrist Complete Right  12/30/2011  *RADIOLOGY REPORT*  Clinical Data: Fall, laceration  RIGHT WRIST - COMPLETE 3+ VIEW  Comparison: None.  Findings: Four views of the right wrist submitted.  There is displaced impacted comminuted fracture of distal right radius. Probable subtle fracture of tip of the ulnar styloid.  IMPRESSION: Displaced impacted comminuted fracture of distal right radius. Probable subtle fracture of tip of ulnar styloid.   Original Report Authenticated By: Lahoma Crocker, M.D.    Dg Hip Complete Right  12/30/2011  *RADIOLOGY REPORT*  Clinical Data: Fall, laceration  RIGHT HIP - COMPLETE 2+ VIEW  Comparison: None.  Findings: Three views of the right hip submitted.  No acute fracture or subluxation.  There is right hip prosthesis  in anatomic alignment.   IMPRESSION: No acute fracture or subluxation.  Right hip prosthesis  in anatomic alignment.   Original Report Authenticated By: Lahoma Crocker, M.D.    Ct Hip Right Wo Contrast  12/30/2011  *RADIOLOGY REPORT*  Clinical Data: Right hip pain following a fall.  CT OF THE RIGHT HIP WITHOUT CONTRAST  Technique:  Multidetector CT imaging was performed according to the standard protocol. Multiplanar CT image reconstructions were also generated.  Comparison: 12/30/2011.  Findings: Uncomplicated right total hip arthroplasty is present. Distal femoral stem visualized and appears within normal limits. There  is no evidence of hardware loosening or failure. Polyethylene spacer appears within normal limits.  There is a nondisplaced fracture of the right obturator ring. Nondisplaced fracture of the parasymphyseal pubis is present and a nondisplaced fracture of the inferior pubic ramus.  These fractures are radiographically occult on the prior exam.  Atherosclerosis is incidentally noted.  On sagittal imaging, there is buckling of the inferior sacrum, suggesting nondisplaced complimentary sacral fracture although this is only seen on sagittal images.  IMPRESSION: 1.  Uncomplicated right total hip arthroplasty.  No periprosthetic fracture. 2.  Nondisplaced right obturator ring fracture. 3.  Partially visualized buckle fracture of the sacrum at about the S3 level.  This fracture with complement the right obturator ring fractures.   Original Report Authenticated By: Dereck Ligas, M.D.      1. Closed fracture of radius   2. Pelvic fracture   3. Facial laceration       MDM  78 year old female with right hip and right wrist pain after a fall. Imaging significant for a distal right radius fracture. There are some superficial abrasions to the area of the flexor crease of the wrist, but this is not an open injury. Displaced and will need to be reduced. Imaging of the hip was negative for acute osseous abnormality. Small facial  laceration which needs to be closed. Tetanus is current. No LOC, no focal motor deficit and no blood thinners aside from ASA. No midline cervical spine tenderness. Neuro imaging consider but deferred.  9:03 PM Good alignment after reduction. Remains neurovascularly intact. Attempted to ambulate prior to discharge. Patient is unable to bear weight on her right lower extremity. X-rays of her hip did not show any acute osseous abnormality. Will CT.  10:37 PM CT with pelvic fx's and likely sacrum as well. Will obtain basic labs. Admit.     Virgel Manifold, MD 12/30/11 2258

## 2011-12-30 NOTE — ED Notes (Addendum)
Pt presenting to with c/o laceration to right wrist and laceration to right eye brow. Pt states she hit her head hard on the cement pt denies loc. Pt is alert and and oriented at this time. Pt with bandage to right wrist from home. Pt with obvious deformity to right wrist and pt states right hip pain with standing

## 2011-12-31 ENCOUNTER — Inpatient Hospital Stay (HOSPITAL_COMMUNITY): Payer: Medicare Other

## 2011-12-31 ENCOUNTER — Encounter (HOSPITAL_COMMUNITY): Payer: Self-pay | Admitting: *Deleted

## 2011-12-31 DIAGNOSIS — I10 Essential (primary) hypertension: Secondary | ICD-10-CM

## 2011-12-31 DIAGNOSIS — S329XXA Fracture of unspecified parts of lumbosacral spine and pelvis, initial encounter for closed fracture: Secondary | ICD-10-CM | POA: Diagnosis present

## 2011-12-31 LAB — GLUCOSE, CAPILLARY: Glucose-Capillary: 94 mg/dL (ref 70–99)

## 2011-12-31 LAB — BASIC METABOLIC PANEL
CO2: 28 mEq/L (ref 19–32)
Calcium: 8.6 mg/dL (ref 8.4–10.5)
GFR calc Af Amer: 46 mL/min — ABNORMAL LOW (ref >90)
Sodium: 136 mEq/L (ref 135–145)

## 2011-12-31 LAB — CBC
MCH: 29.7 pg (ref 26.0–34.0)
Platelets: 261 10*3/uL (ref 150–400)
RBC: 3.57 MIL/uL — ABNORMAL LOW (ref 3.87–5.11)
RDW: 14.9 % (ref 11.5–15.5)
WBC: 6.7 10*3/uL (ref 4.0–10.5)

## 2011-12-31 MED ORDER — VITAMIN D 50 MCG (2000 UT) PO CAPS: 1.0000 | ORAL_CAPSULE | Freq: Every day | ORAL | Status: DC

## 2011-12-31 MED ORDER — ASPIRIN 81 MG PO CHEW
81.0000 mg | CHEWABLE_TABLET | Freq: Every day | ORAL | Status: DC
  Administered 2012-01-01: 81 mg via ORAL
  Filled 2011-12-31 (×2): qty 1

## 2011-12-31 MED ORDER — ALPRAZOLAM 0.5 MG PO TABS
0.5000 mg | ORAL_TABLET | Freq: Every evening | ORAL | Status: DC | PRN
  Administered 2011-12-31: 0.5 mg via ORAL
  Filled 2011-12-31: qty 1

## 2011-12-31 MED ORDER — VITAMIN D3 25 MCG (1000 UNIT) PO TABS
2000.0000 [IU] | ORAL_TABLET | Freq: Every day | ORAL | Status: DC
  Administered 2011-12-31 – 2012-01-03 (×4): 2000 [IU] via ORAL
  Filled 2011-12-31 (×4): qty 2

## 2011-12-31 MED ORDER — ADULT MULTIVITAMIN W/MINERALS CH
1.0000 | ORAL_TABLET | Freq: Every day | ORAL | Status: DC
  Administered 2011-12-31 – 2012-01-03 (×4): 1 via ORAL
  Filled 2011-12-31 (×4): qty 1

## 2011-12-31 MED ORDER — PNEUMOCOCCAL VAC POLYVALENT 25 MCG/0.5ML IJ INJ
0.5000 mL | INJECTION | INTRAMUSCULAR | Status: AC
  Administered 2012-01-01: 0.5 mL via INTRAMUSCULAR
  Filled 2011-12-31: qty 0.5

## 2011-12-31 MED ORDER — AMLODIPINE BESYLATE 5 MG PO TABS
5.0000 mg | ORAL_TABLET | Freq: Every day | ORAL | Status: DC
  Administered 2011-12-31 – 2012-01-03 (×4): 5 mg via ORAL
  Filled 2011-12-31 (×4): qty 1

## 2011-12-31 MED ORDER — ASPIRIN 81 MG PO TABS: 81.0000 mg | ORAL_TABLET | Freq: Every day | ORAL | Status: DC

## 2011-12-31 MED ORDER — VITAMIN C 500 MG PO TABS
1000.0000 mg | ORAL_TABLET | Freq: Every day | ORAL | Status: DC
  Administered 2011-12-31 – 2012-01-03 (×4): 1000 mg via ORAL
  Filled 2011-12-31 (×4): qty 2

## 2011-12-31 MED ORDER — INFLUENZA VIRUS VACC SPLIT PF IM SUSP
0.5000 mL | INTRAMUSCULAR | Status: AC
  Administered 2012-01-01: 0.5 mL via INTRAMUSCULAR
  Filled 2011-12-31: qty 0.5

## 2011-12-31 MED ORDER — ONDANSETRON HCL 4 MG/2ML IJ SOLN: 4.0000 mg | Freq: Three times a day (TID) | INTRAMUSCULAR | Status: AC | PRN

## 2011-12-31 MED ORDER — HYDROCODONE-ACETAMINOPHEN 5-325 MG PO TABS
1.0000 | ORAL_TABLET | Freq: Four times a day (QID) | ORAL | Status: DC | PRN
  Administered 2011-12-31: 2 via ORAL
  Administered 2012-01-01 – 2012-01-02 (×5): 1 via ORAL
  Administered 2012-01-03: 2 via ORAL
  Filled 2011-12-31 (×2): qty 1
  Filled 2011-12-31: qty 2
  Filled 2011-12-31: qty 1
  Filled 2011-12-31: qty 2
  Filled 2011-12-31 (×2): qty 1

## 2011-12-31 MED ORDER — MECLIZINE HCL 12.5 MG PO TABS
12.5000 mg | ORAL_TABLET | Freq: Every day | ORAL | Status: DC
  Administered 2011-12-31: 12.5 mg via ORAL
  Filled 2011-12-31 (×3): qty 1

## 2011-12-31 MED ORDER — PREGABALIN 25 MG PO CAPS
25.0000 mg | ORAL_CAPSULE | Freq: Two times a day (BID) | ORAL | Status: DC
  Administered 2011-12-31 – 2012-01-03 (×8): 25 mg via ORAL
  Filled 2011-12-31 (×4): qty 1
  Filled 2011-12-31: qty 3
  Filled 2011-12-31 (×5): qty 1

## 2011-12-31 MED ORDER — FUROSEMIDE 20 MG PO TABS
20.0000 mg | ORAL_TABLET | Freq: Every day | ORAL | Status: DC
  Administered 2011-12-31: 20 mg via ORAL
  Filled 2011-12-31 (×2): qty 1

## 2011-12-31 MED ORDER — MORPHINE SULFATE 2 MG/ML IJ SOLN: 0.5000 mg | INTRAMUSCULAR | Status: DC | PRN

## 2011-12-31 MED ORDER — ENOXAPARIN SODIUM 30 MG/0.3ML ~~LOC~~ SOLN
30.0000 mg | Freq: Two times a day (BID) | SUBCUTANEOUS | Status: DC
  Administered 2011-12-31 – 2012-01-03 (×6): 30 mg via SUBCUTANEOUS
  Filled 2011-12-31 (×8): qty 0.3

## 2011-12-31 MED ORDER — PREGABALIN 50 MG PO CAPS
100.0000 mg | ORAL_CAPSULE | Freq: Every day | ORAL | Status: DC
  Administered 2011-12-31 – 2012-01-02 (×3): 100 mg via ORAL
  Filled 2011-12-31 (×2): qty 2

## 2011-12-31 MED ORDER — HYDROMORPHONE HCL PF 1 MG/ML IJ SOLN
1.0000 mg | INTRAMUSCULAR | Status: AC | PRN
  Administered 2011-12-31: 1 mg via INTRAVENOUS
  Filled 2011-12-31: qty 1

## 2011-12-31 NOTE — Progress Notes (Signed)
PT NOTE:  PT deferred this date.  PT with bedrest order, ortho consult order and no WB status.  Care coordinator and RN aware.  Will follow in am.

## 2011-12-31 NOTE — H&P (Signed)
[facility] History and Physical  Krystal Henderson [id number] DOB: 11/23/1932 DOA: 12/30/2011  Referring physician: ER physician PCP: [name], MD   Chief Complaint: right sided hip and wrist pain status post fall  HPI:  78 year old female with past medical history significant for but not limited to hypertension, peripheral neuropathy, anxiety who presented to ED status post mechanical fall today prior to this admission. Patient reports she tripped and fell onto her right side of the body and has experienced pain on this side ever since. Patient denied prodromal symptoms of chest pain, palpitations, shortness of breath prior or after the fall. No lightheadedness, dizziness or double vision prior to the fall. No reports of abdominal pain, nausea or vomiting. No reports of fever or chills. No reports of blood in stool or urine.  Assessment and Plan:  Active Problems:  Pelvic fracture - pelvic and right obturator ring fracture - identified on CT pelvis not on X ray - please follow up ortho consult which has been requested by ED physician - provide supportive care with IV fluids and analgesia  Distal right radial fracture - status post closed reduction in ED and x ray obtained post reduction and cast fixation significantly improved  Chronic kidney disease, stage 3 - creatinine around baseline value compared with creatinine in 09/2011 - would hold losartan for now - patient is on low dose lasix which we will continue - follow up am BMP  Peripheral neuropathy - continue lyrica per home regimen  Anxiety - continue Xanax per home regimen  DVT prophylaxis - Lovenox sub Q  Code Status: Full Family Communication: Pt at bedside Disposition Plan: Admit for further evaluation  [name], MD  [facility] Pager [phone]  If 7PM-7AM, please contact night-coverage [email or web address] Password [facility] 12/31/2011, 12:25 AM  Review of Systems:  Constitutional: Negative for fever,  chills and malaise/fatigue. Negative for diaphoresis.  HENT: Negative for hearing loss, ear pain, nosebleeds, congestion, sore throat, neck pain, tinnitus and ear discharge.   Eyes: Negative for blurred vision, double vision, photophobia, pain, discharge and redness.  Respiratory: Negative for cough, hemoptysis, sputum production, shortness of breath, wheezing and stridor.   Cardiovascular: Negative for chest pain, palpitations, orthopnea, claudication and leg swelling.  Gastrointestinal: Negative for nausea, vomiting and abdominal pain. Negative for heartburn, constipation, blood in stool and melena.  Genitourinary: Negative for dysuria, urgency, frequency, hematuria and flank pain.  Musculoskeletal: Negative for myalgias, back pain, joint pain and positive for falls.  Skin: Negative for itching and rash.  Neurological: Negative for dizziness and weakness. Negative for tingling, tremors, sensory change, speech change, focal weakness, loss of consciousness and headaches.  Endo/Heme/Allergies: Negative for environmental allergies and polydipsia. Does not bruise/bleed easily.  Psychiatric/Behavioral: Negative for suicidal ideas. The patient is not nervous/anxious.      Past Medical History  Diagnosis Date  . HTN (hypertension)   . Hyperlipemia 2006    LDL 130  . Esophageal reflux     inactive  . Anxiety   . Hypoglycemia     reactive  . Peripheral neuropathy   . Vitamin B12 deficiency   . Personal history of colonic polyps     adenomatous  . Diverticulosis of colon (without mention of hemorrhage)   . DJD (degenerative joint disease)   . Osteoporosis   . Angiodysplasia 2007    @ colonoscopy  . Pneumonia     hx of pneumonia   . Anemia     hx of    Past  Surgical History  Procedure Date  . Tear duct probing     X 2  . Total hip arthroplasty 2000    right  . Facial cosmetic surgery   . Tubal ligation   . Hemorroidectomy   . Dilation and curettage of uterus   . Colonoscopy w/  polypectomy     X 2 , Dr  Verl Blalock; angiodysplasia. Due 2022  . Lumbar laminectomy/decompression microdiscectomy 09/10/2011    Procedure: LUMBAR LAMINECTOMY/DECOMPRESSION MICRODISCECTOMY;  Surgeon: Johnn Hai, MD;  Location: [facility];  Service: Orthopedics;  Laterality: N/A;  decompression laminectomy L2-3, L3-4, L4-5   Social History:  reports that she has never smoked. She has never used smokeless tobacco. She reports that she drinks alcohol. She reports that she does not use illicit drugs.  Allergies  Allergen Reactions  . Codeine     REACTION: nausea  . Duloxetine     REACTION: intolerance  . Sertraline Hcl     REACTION: intolerance    Family History: HTN in parents  Prior to Admission medications   Medication Sig Start Date End Date Taking? Authorizing Provider  ALPRAZolam Duanne Moron) 0.25 MG tablet Take 0.5-1 mg by mouth at bedtime as needed. Anxiety   Yes Historical Provider, MD  amLODipine (NORVASC) 5 MG tablet Take 5 mg by mouth daily.   Yes Historical Provider, MD  Ascorbic Acid (VITAMIN C) 1000 MG tablet Take 1,000 mg by mouth daily.    Yes Historical Provider, MD  aspirin 81 MG tablet Take 81 mg by mouth daily.    Yes Historical Provider, MD  Calcium Carbonate (CALCIUM 600 PO) Take 1 capsule by mouth daily.    Yes Historical Provider, MD  Cholecalciferol (VITAMIN D) 2000 UNITS CAPS Take 1 capsule by mouth daily.    Yes Historical Provider, MD  furosemide (LASIX) 20 MG tablet Take 20 mg by mouth daily. 11/08/11  Yes [name], MD  losartan (COZAAR) 100 MG tablet Take 100 mg by mouth daily.   Yes Historical Provider, MD  meclizine (ANTIVERT) 25 MG tablet Take 12.5 mg by mouth daily.   Yes Historical Provider, MD  Multiple Vitamin (MULTIVITAMIN WITH MINERALS) TABS Take 1 tablet by mouth daily.   Yes Historical Provider, MD  Omega-3 Fatty Acids (FISH OIL) 1000 MG CAPS Take 1 capsule by mouth daily.    Yes Historical Provider, MD  pregabalin (LYRICA) 100 MG capsule  Take 100 mg by mouth at bedtime.    Yes Historical Provider, MD  pregabalin (LYRICA) 25 MG capsule Take 25 mg by mouth 2 (two) times daily. One in the morning and one at noon   Yes Historical Provider, MD   Physical Exam: Filed Vitals:   12/30/11 2006 12/30/11 2013 12/30/11 2016 12/30/11 2109  BP: 147/64 161/66 160/69 126/53  Pulse: 79 80 81 78  Temp:      TempSrc:      Resp: 19 14 15 12   SpO2: 99% 99% 100% 100%    Physical Exam  Constitutional: Appears in no acute distress HENT: Normocephalic. No tonsillar erythema or exudate; right eyelid laceration Eyes: Conjunctivae and EOM are normal. PERRLA, no scleral icterus.  Neck: Normal ROM. Neck supple. No JVD. No tracheal deviation. No thyromegaly.  CVS: RRR, S1/S2 +, no murmurs, no gallops, no carotid bruit.  Pulmonary: Effort and breath sounds normal, no stridor, rhonchi, wheezes, rales.  Abdominal: Soft. BS +,  no distension, tenderness, rebound or guarding.  Musculoskeletal: tenderness over right hip and right wrist.  Lymphadenopathy: No lymphadenopathy noted, cervical, inguinal. Neuro: Alert. Normal reflexes, muscle tone coordination. No cranial nerve deficit. Skin: right eyelid laceration. No rash noted. Not diaphoretic. No erythema. No pallor.  Psychiatric: Normal mood and affect. Behavior, judgment, thought content normal.   Labs on Admission:  Basic Metabolic Panel:  Lab 0000000 2306  NA 135  K 4.6  CL 102  CO2 24  GLUCOSE 116*  BUN 18  CREATININE 1.22*  CALCIUM 8.7   CBC:  Lab 12/30/11 2306  WBC 8.8  HGB 11.1*  HCT 33.1*  MCV 89.7  PLT 283    Radiological Exams on Admission: Dg Wrist Complete Right 12/30/2011  *  IMPRESSION: 1.  Status post closed reduction and cast fixation for distal radial and ulnar fractures was significantly improved alignment, as above.     Dg Wrist Complete Right 12/30/2011  *IMPRESSION: Displaced impacted comminuted fracture of distal right radius. Probable subtle fracture of tip  of ulnar styloid.      Dg Hip Complete Right 12/30/2011  * IMPRESSION: No acute fracture or subluxation.  Right hip prosthesis  in anatomic alignment.       Ct Hip Right Wo Contrast 12/30/2011  *  IMPRESSION: 1.  Uncomplicated right total hip arthroplasty.  No periprosthetic fracture. 2.  Nondisplaced right obturator ring fracture. 3.  Partially visualized buckle fracture of the sacrum at about the S3 level.  This fracture with complement the right obturator ring fractures.     Time spent: 95 minutes

## 2011-12-31 NOTE — Consult Note (Signed)
Krystal Henderson is an 78 y.o. female.   Chief Complaint: right wrist fracture HPI: 78 yo rhd female fell at [employer] yesterday injuring right wrist and hip.  Seen at [facility] where XR revealed right distal radius fracture.  Closed reduction by ER staff and admission by hospitalist for ambulatory dysfunction.  Reports no previous injury to right wrist and right hip and forehead injury currently.    Past Medical History  Diagnosis Date  . HTN (hypertension)   . Hyperlipemia 2006    LDL 130  . Esophageal reflux     inactive  . Anxiety   . Hypoglycemia     reactive  . Peripheral neuropathy   . Vitamin B12 deficiency   . Personal history of colonic polyps     adenomatous  . Diverticulosis of colon (without mention of hemorrhage)   . DJD (degenerative joint disease)   . Osteoporosis   . Angiodysplasia 2007    @ colonoscopy  . Pneumonia     hx of pneumonia   . Anemia     hx of     Past Surgical History  Procedure Date  . Tear duct probing     X 2  . Total hip arthroplasty 2000    right  . Facial cosmetic surgery   . Tubal ligation   . Hemorroidectomy   . Dilation and curettage of uterus   . Colonoscopy w/ polypectomy     X 2 , Dr  Verl Blalock; angiodysplasia. Due 2022  . Lumbar laminectomy/decompression microdiscectomy 09/10/2011    Procedure: LUMBAR LAMINECTOMY/DECOMPRESSION MICRODISCECTOMY;  Surgeon: Johnn Hai, MD;  Location: [facility];  Service: Orthopedics;  Laterality: N/A;  decompression laminectomy L2-3, L3-4, L4-5    Family History  Problem Relation Age of Onset  . Colon cancer Neg Hx   . Diabetes Neg Hx   . Heart disease Father 7    MI  . Kidney disease Mother     renal failure  . Throat cancer Sister     smoker  . Osteoporosis Sister   . Heart attack Brother 10  . Stroke Brother 53    smoker  . Depression Maternal Uncle   . Cancer Brother     X3  lung cancer, all smokers  . Peripheral vascular disease Daughter    Social History:  reports  that she has never smoked. She has never used smokeless tobacco. She reports that she drinks alcohol. She reports that she does not use illicit drugs.  Allergies:  Allergies  Allergen Reactions  . Codeine     REACTION: nausea  . Duloxetine     REACTION: intolerance  . Sertraline Hcl     REACTION: intolerance    Medications Prior to Admission  Medication Sig Dispense Refill  . ALPRAZolam (XANAX) 0.25 MG tablet Take 0.5-1 mg by mouth at bedtime as needed. Anxiety      . amLODipine (NORVASC) 5 MG tablet Take 5 mg by mouth daily.      . Ascorbic Acid (VITAMIN C) 1000 MG tablet Take 1,000 mg by mouth daily.       [name] aspirin 81 MG tablet Take 81 mg by mouth daily.       . Calcium Carbonate (CALCIUM 600 PO) Take 1 capsule by mouth daily.       . Cholecalciferol (VITAMIN D) 2000 UNITS CAPS Take 1 capsule by mouth daily.       . furosemide (LASIX) 20 MG tablet Take 20 mg by  mouth daily.      [name] losartan (COZAAR) 100 MG tablet Take 100 mg by mouth daily.      . meclizine (ANTIVERT) 25 MG tablet Take 12.5 mg by mouth daily.      . Multiple Vitamin (MULTIVITAMIN WITH MINERALS) TABS Take 1 tablet by mouth daily.      . Omega-3 Fatty Acids (FISH OIL) 1000 MG CAPS Take 1 capsule by mouth daily.       . pregabalin (LYRICA) 100 MG capsule Take 100 mg by mouth at bedtime.       . pregabalin (LYRICA) 25 MG capsule Take 25 mg by mouth 2 (two) times daily. One in the morning and one at noon        Results for orders placed during the hospital encounter of 12/30/11 (from the past 48 hour(s))  CBC     Status: Abnormal   Collection Time   12/30/11 11:06 PM      Component Value Range Comment   WBC 8.8  4.0 - 10.5 K/uL    RBC 3.69 (*) 3.87 - 5.11 MIL/uL    Hemoglobin 11.1 (*) 12.0 - 15.0 g/dL    HCT 33.1 (*) 36.0 - 46.0 %    MCV 89.7  78.0 - 100.0 fL    MCH 30.1  26.0 - 34.0 pg    MCHC 33.5  30.0 - 36.0 g/dL    RDW 14.7  11.5 - 15.5 %    Platelets 283  150 - 400 K/uL   BASIC METABOLIC PANEL      Status: Abnormal   Collection Time   12/30/11 11:06 PM      Component Value Range Comment   Sodium 135  135 - 145 mEq/L    Potassium 4.6  3.5 - 5.1 mEq/L    Chloride 102  96 - 112 mEq/L    CO2 24  19 - 32 mEq/L    Glucose, Bld 116 (*) 70 - 99 mg/dL    BUN 18  6 - 23 mg/dL    Creatinine, Ser 1.22 (*) 0.50 - 1.10 mg/dL    Calcium 8.7  8.4 - 10.5 mg/dL    GFR calc non Af Amer 42 (*) >90 mL/min    GFR calc Af Amer 48 (*) >90 mL/min   CBC     Status: Abnormal   Collection Time   12/31/11  4:03 AM      Component Value Range Comment   WBC 6.7  4.0 - 10.5 K/uL    RBC 3.57 (*) 3.87 - 5.11 MIL/uL    Hemoglobin 10.6 (*) 12.0 - 15.0 g/dL    HCT 32.4 (*) 36.0 - 46.0 %    MCV 90.8  78.0 - 100.0 fL    MCH 29.7  26.0 - 34.0 pg    MCHC 32.7  30.0 - 36.0 g/dL    RDW 14.9  11.5 - 15.5 %    Platelets 261  150 - 400 K/uL   BASIC METABOLIC PANEL     Status: Abnormal   Collection Time   12/31/11  4:03 AM      Component Value Range Comment   Sodium 136  135 - 145 mEq/L    Potassium 4.5  3.5 - 5.1 mEq/L    Chloride 102  96 - 112 mEq/L    CO2 28  19 - 32 mEq/L    Glucose, Bld 120 (*) 70 - 99 mg/dL    BUN 16  6 - 23 mg/dL  Creatinine, Ser 1.28 (*) 0.50 - 1.10 mg/dL    Calcium 8.6  8.4 - 10.5 mg/dL    GFR calc non Af Amer 39 (*) >90 mL/min    GFR calc Af Amer 46 (*) >90 mL/min   GLUCOSE, CAPILLARY     Status: Normal   Collection Time   12/31/11  7:42 AM      Component Value Range Comment   Glucose-Capillary 94  70 - 99 mg/dL     Dg Wrist Complete Right  12/30/2011  *RADIOLOGY REPORT*  Clinical Data: Right wrist fracture status post reduction.  RIGHT WRIST - COMPLETE 3+ VIEW  Comparison: 12/30/2011.  Findings: Compared to the prior examination there has been interval closed reduction and cast fixation for the distal radial and ulnar fractures.  This cast now obscures fine bony detail.  However, there is significantly improved alignment, which is now near anatomic.  The highly comminuted  intra-articular fracture of the distal right radius in particular is markedly improved.  A small ulnar styloid avulsion fracture.  IMPRESSION: 1.  Status post closed reduction and cast fixation for distal radial and ulnar fractures was significantly improved alignment, as above.   Original Report Authenticated By: Etheleen Mayhew, M.D.    Dg Wrist Complete Right  12/30/2011  *RADIOLOGY REPORT*  Clinical Data: Fall, laceration  RIGHT WRIST - COMPLETE 3+ VIEW  Comparison: None.  Findings: Four views of the right wrist submitted.  There is displaced impacted comminuted fracture of distal right radius. Probable subtle fracture of tip of the ulnar styloid.  IMPRESSION: Displaced impacted comminuted fracture of distal right radius. Probable subtle fracture of tip of ulnar styloid.   Original Report Authenticated By: Lahoma Crocker, M.D.    Dg Hip Complete Right  12/30/2011  *RADIOLOGY REPORT*  Clinical Data: Fall, laceration  RIGHT HIP - COMPLETE 2+ VIEW  Comparison: None.  Findings: Three views of the right hip submitted.  No acute fracture or subluxation.  There is right hip prosthesis  in anatomic alignment.  IMPRESSION: No acute fracture or subluxation.  Right hip prosthesis  in anatomic alignment.   Original Report Authenticated By: Lahoma Crocker, M.D.    Ct Hip Right Wo Contrast  12/30/2011  *RADIOLOGY REPORT*  Clinical Data: Right hip pain following a fall.  CT OF THE RIGHT HIP WITHOUT CONTRAST  Technique:  Multidetector CT imaging was performed according to the standard protocol. Multiplanar CT image reconstructions were also generated.  Comparison: 12/30/2011.  Findings: Uncomplicated right total hip arthroplasty is present. Distal femoral stem visualized and appears within normal limits. There is no evidence of hardware loosening or failure. Polyethylene spacer appears within normal limits.  There is a nondisplaced fracture of the right obturator ring. Nondisplaced fracture of the parasymphyseal pubis is  present and a nondisplaced fracture of the inferior pubic ramus.  These fractures are radiographically occult on the prior exam.  Atherosclerosis is incidentally noted.  On sagittal imaging, there is buckling of the inferior sacrum, suggesting nondisplaced complimentary sacral fracture although this is only seen on sagittal images.  IMPRESSION: 1.  Uncomplicated right total hip arthroplasty.  No periprosthetic fracture. 2.  Nondisplaced right obturator ring fracture. 3.  Partially visualized buckle fracture of the sacrum at about the S3 level.  This fracture with complement the right obturator ring fractures.   Original Report Authenticated By: Dereck Ligas, M.D.      A comprehensive review of systems was negative except for: Hematologic/lymphatic: positive for bleeding and easy bruising  Blood  pressure 138/69, pulse 61, temperature 98.2 F (36.8 C), temperature source Oral, resp. rate 16, height 5\' 6"  (1.676 m), weight 84.4 kg (186 lb 1.1 oz), SpO2 94.00%.  General appearance: alert, cooperative and appears stated age Head: Normocephalic, without obvious abnormality, atraumatic Neck: supple, symmetrical, trachea midline Extremities: light touch sensation and capillary refill intact all digits.  +epl/fpl/io.  skin intact except abrasions to volar right wrist.  compartments soft.  left ue without ttp or wound. Pulses: 2+ and symmetric Skin: as above Neurologic: Grossly normal Incision/Wound: As above  Assessment/Plan Right wrist comminuted intraarticular distal radius fracture.  Discussed nature of injury with patient and family members.  Discussed non operative and operative treatment options.  Patient will probably benefit from operative fixation.  Will get CT wrist while admitted and plan follow up as outpatient this week.  They agree with plan of care.  Dezerae Freiberger R 12/31/2011, 3:24 PM

## 2011-12-31 NOTE — Progress Notes (Signed)
Patient ID: Krystal Henderson, female   DOB: 1933-01-19, 78 y.o.   MRN: [id number] I was called as a consult to see Mrs. Orellana for her pelvic fracture.  She is not in her room at the moment, but her husband is and he let me know that she is a patient of [facility] ( Dr. Leone Brand. Maureen Ralphs).  Their practice will need to be called to address her pelvic fracture per her husband's request.

## 2011-12-31 NOTE — Progress Notes (Addendum)
[facility] PROGRESS NOTE  Krystal Henderson [id number] DOB: [birth year] DOA: 12/30/2011 PCP: [name], MD  Assessment/Plan: Pelvic fracture  - pelvic and right obturator ring fracture  Ortho consult pending - provide supportive care with IV fluids and analgesics -PT eval  Distal right radial fracture  - status post closed reduction in ED and x ray obtained post reduction. Continue sling. Will follow with ortho. PT eval  Chronic kidney disease, stage 3  - creatinine around baseline  continue lasix. holding losartan  HTN Continue norvasc, lasix  Peripheral neuropathy  - continue lyrica   Anxiety  - continue Xanax   Hold ASA and lovenox until seen by ortho  Code Status: full code Family Communication: none at bedside Disposition Plan: pending PT eval   Brief narrative: 78 year old female with past medical history significant for but not limited to hypertension, peripheral neuropathy, anxiety who presented to ED status post mechanical fall with non displaced right wrist fracture and Nondisplaced right obturator ring fracture.    Consultants:  [facility]  Procedures:  Closed reduction of right wrist fracture in ED.  Antibiotics:  none  HPI/Subjective: Denies any hip pain at rest. Able to stand but has difficulty ambulating due to pain   Objective: Filed Vitals:   12/31/11 0128 12/31/11 0652 12/31/11 0800 12/31/11 0902  BP: 123/73 99/61  121/66  Pulse: 69 55  65  Temp: 97 F (36.1 C) 97.5 F (36.4 C)    TempSrc: Oral Oral    Resp: 16 14 15    Height: 5\' 6"  (1.676 m)     Weight: 84.4 kg (186 lb 1.1 oz)     SpO2: 95% 90%  92%    Intake/Output Summary (Last 24 hours) at 12/31/11 1108 Last data filed at 12/31/11 0653  Gross per 24 hour  Intake      0 ml  Output   1050 ml  Net  -1050 ml   Filed Weights   12/31/11 0128  Weight: 84.4 kg (186 lb 1.1 oz)    Exam:   General:  Elderly female lying in bed in  NAD  Heent: no pallor, moist oral mucosa  Cardiovascular: N S1&S2, no murmurs  Respiratory: clear breath sounds b/l, no added sounds  Abdomen: soft, NT, ND  Ext; warm, sling over right wrist, no edema, difficulty with abduction of legs and ambulation  Data Reviewed: Basic Metabolic Panel:  Lab [id number] [id number] 12/30/11 2306  NA 136 135  K 4.5 4.6  CL 102 102  CO2 28 24  GLUCOSE 120* 116*  BUN 16 18  CREATININE 1.28* 1.22*  CALCIUM 8.6 8.7  MG -- --  PHOS -- --   Liver Function Tests: No results found for this basename: AST:5,ALT:5,ALKPHOS:5,BILITOT:5,PROT:5,ALBUMIN:5 in the last 168 hours No results found for this basename: LIPASE:5,AMYLASE:5 in the last 168 hours No results found for this basename: AMMONIA:5 in the last 168 hours CBC:  Lab 12/31/11 0403 12/30/11 2306  WBC 6.7 8.8  NEUTROABS -- --  HGB 10.6* 11.1*  HCT 32.4* 33.1*  MCV 90.8 89.7  PLT 261 283   Cardiac Enzymes: No results found for this basename: CKTOTAL:5,CKMB:5,CKMBINDEX:5,TROPONINI:5 in the last 168 hours BNP (last 3 results) No results found for this basename: PROBNP:3 in the last 8760 hours CBG:  Lab 12/31/11 0742  GLUCAP 94    No results found for this or any previous visit (from the past 240 hour(s)).   Studies: Dg Wrist Complete Right  12/30/2011  *RADIOLOGY  REPORT*  Clinical Data: Right wrist fracture status post reduction.  RIGHT WRIST - COMPLETE 3+ VIEW  Comparison: 12/30/2011.  Findings: Compared to the prior examination there has been interval closed reduction and cast fixation for the distal radial and ulnar fractures.  This cast now obscures fine bony detail.  However, there is significantly improved alignment, which is now near anatomic.  The highly comminuted intra-articular fracture of the distal right radius in particular is markedly improved.  A small ulnar styloid avulsion fracture.  IMPRESSION: 1.  Status post closed reduction and cast fixation for distal radial and ulnar  fractures was significantly improved alignment, as above.   Original Report Authenticated By: Etheleen Mayhew, M.D.    Dg Wrist Complete Right  12/30/2011  *RADIOLOGY REPORT*  Clinical Data: Fall, laceration  RIGHT WRIST - COMPLETE 3+ VIEW  Comparison: None.  Findings: Four views of the right wrist submitted.  There is displaced impacted comminuted fracture of distal right radius. Probable subtle fracture of tip of the ulnar styloid.  IMPRESSION: Displaced impacted comminuted fracture of distal right radius. Probable subtle fracture of tip of ulnar styloid.   Original Report Authenticated By: Lahoma Crocker, M.D.    Dg Hip Complete Right  12/30/2011  *RADIOLOGY REPORT*  Clinical Data: Fall, laceration  RIGHT HIP - COMPLETE 2+ VIEW  Comparison: None.  Findings: Three views of the right hip submitted.  No acute fracture or subluxation.  There is right hip prosthesis  in anatomic alignment.  IMPRESSION: No acute fracture or subluxation.  Right hip prosthesis  in anatomic alignment.   Original Report Authenticated By: Lahoma Crocker, M.D.    Ct Hip Right Wo Contrast  12/30/2011  *RADIOLOGY REPORT*  Clinical Data: Right hip pain following a fall.  CT OF THE RIGHT HIP WITHOUT CONTRAST  Technique:  Multidetector CT imaging was performed according to the standard protocol. Multiplanar CT image reconstructions were also generated.  Comparison: 12/30/2011.  Findings: Uncomplicated right total hip arthroplasty is present. Distal femoral stem visualized and appears within normal limits. There is no evidence of hardware loosening or failure. Polyethylene spacer appears within normal limits.  There is a nondisplaced fracture of the right obturator ring. Nondisplaced fracture of the parasymphyseal pubis is present and a nondisplaced fracture of the inferior pubic ramus.  These fractures are radiographically occult on the prior exam.  Atherosclerosis is incidentally noted.  On sagittal imaging, there is buckling of the inferior  sacrum, suggesting nondisplaced complimentary sacral fracture although this is only seen on sagittal images.  IMPRESSION: 1.  Uncomplicated right total hip arthroplasty.  No periprosthetic fracture. 2.  Nondisplaced right obturator ring fracture. 3.  Partially visualized buckle fracture of the sacrum at about the S3 level.  This fracture with complement the right obturator ring fractures.   Original Report Authenticated By: Dereck Ligas, M.D.     Scheduled Meds:   . amLODipine  5 mg Oral Daily  . aspirin  81 mg Oral Daily  . cholecalciferol  2,000 Units Oral Daily  . enoxaparin (LOVENOX) injection  30 mg Subcutaneous Q12H  . fentaNYL  50 mcg Intravenous Once  . fentaNYL  50 mcg Intravenous Once  . furosemide  20 mg Oral Daily  . ibuprofen  400 mg Oral Once  . influenza  inactive virus vaccine  0.5 mL Intramuscular Tomorrow-1000  . ketamine  200 mg Intravenous Once  . meclizine  12.5 mg Oral Daily  .  morphine injection  4 mg Intravenous Once  . multivitamin  with minerals  1 tablet Oral Daily  . ondansetron (ZOFRAN) IV  4 mg Intravenous Once  . ondansetron (ZOFRAN) IV  4 mg Intravenous Once  . pneumococcal 23 valent vaccine  0.5 mL Intramuscular Tomorrow-1000  . pregabalin  100 mg Oral Once  . pregabalin  100 mg Oral QHS  . pregabalin  25 mg Oral BID  . propofol  200 mg Intravenous Once  . vitamin C  1,000 mg Oral Daily  . DISCONTD: aspirin  81 mg Oral Daily  . DISCONTD: Vitamin D  1 capsule Oral Daily   Continuous Infusions:      Time spent: 30 minutes    Merissa Renwick  [facility] Pager [phone]. If 8PM-8AM, please contact night-coverage at [email or web address], password [facility] 12/31/2011, 11:08 AM  LOS: 1 day       Patient will see hand surgeon [name] on 9/30 at 1 pm

## 2012-01-01 DIAGNOSIS — N179 Acute kidney failure, unspecified: Secondary | ICD-10-CM | POA: Diagnosis not present

## 2012-01-01 LAB — BASIC METABOLIC PANEL
BUN: 21 mg/dL (ref 6–23)
Calcium: 8.6 mg/dL (ref 8.4–10.5)
Creatinine, Ser: 1.61 mg/dL — ABNORMAL HIGH (ref 0.50–1.10)
GFR calc Af Amer: 34 mL/min — ABNORMAL LOW (ref >90)
GFR calc non Af Amer: 30 mL/min — ABNORMAL LOW (ref >90)
Glucose, Bld: 93 mg/dL (ref 70–99)

## 2012-01-01 LAB — CBC
HCT: 32.3 % — ABNORMAL LOW (ref 36.0–46.0)
Hemoglobin: 10.5 g/dL — ABNORMAL LOW (ref 12.0–15.0)
MCH: 29.7 pg (ref 26.0–34.0)
MCHC: 32.5 g/dL (ref 30.0–36.0)
MCV: 91.5 fL (ref 78.0–100.0)
RDW: 15 % (ref 11.5–15.5)

## 2012-01-01 LAB — URINALYSIS, ROUTINE W REFLEX MICROSCOPIC
Bilirubin Urine: NEGATIVE
Hgb urine dipstick: NEGATIVE
Ketones, ur: NEGATIVE mg/dL
Nitrite: NEGATIVE
Specific Gravity, Urine: 1.011 (ref 1.005–1.030)
Urobilinogen, UA: 0.2 mg/dL (ref 0.0–1.0)

## 2012-01-01 MED ORDER — ASPIRIN EC 81 MG PO TBEC
81.0000 mg | DELAYED_RELEASE_TABLET | Freq: Every day | ORAL | Status: DC
  Administered 2012-01-01 – 2012-01-03 (×3): 81 mg via ORAL
  Filled 2012-01-01 (×3): qty 1

## 2012-01-01 MED ORDER — ENOXAPARIN SODIUM 40 MG/0.4ML ~~LOC~~ SOLN: 40.0000 mg | SUBCUTANEOUS | Status: DC

## 2012-01-01 MED ORDER — ASPIRIN 81 MG PO TABS: 81.0000 mg | ORAL_TABLET | Freq: Every day | ORAL | Status: DC

## 2012-01-01 NOTE — Evaluation (Signed)
Physical Therapy Evaluation Patient Details Name: Krystal Henderson MRN: [id number] DOB: 1933-02-10 Today's Date: 01/01/2012 Time: [id number] PT Time Calculation (min): 34 min  PT Assessment / Plan / Recommendation Clinical Impression  Pt presents with R pelvic fracture and R distal radius fracture following a fall while out with family at [employer].  Tolerated OOB and some steps with R platform RW from bed to chair, however c/o soreness in pelvis.  Pt will benefit from skilled PT in acute venue to address deficits.  PT recommends ST SNF vs HHPT in order to increase pt safety to prepare for eventual D/C home.  Pt stated some concerns, however also understands that safety is a concern at this point with her mobility.      PT Assessment  Patient needs continued PT services    Follow Up Recommendations  Home health PT;Skilled nursing facility    Barriers to Discharge None      Equipment Recommendations   (May need R platform, has RW)    Recommendations for Other Services OT consult   Frequency Min 6X/week    Precautions / Restrictions Precautions Precautions: Fall Restrictions Weight Bearing Restrictions: Yes RUE Weight Bearing: Weight bear through elbow only Other Position/Activity Restrictions: WBAT for RLE   Pertinent Vitals/Pain 4/10      Mobility  Bed Mobility Bed Mobility: Supine to Sit;Sitting - Scoot to Edge of Bed Supine to Sit: 1: +2 Total assist Supine to Sit: Patient Percentage: 60% Sitting - Scoot to Edge of Bed: 3: Mod assist Details for Bed Mobility Assistance: Assist for RLE out of bed with cues for L UE placement on bed to self assist trunk.  Pt also able to use L elbow to WB and also self assist.  Transfers Transfers: Sit to Stand;Stand to Sit;Stand Pivot Transfers Sit to Stand: 1: +2 Total assist;From elevated surface;With upper extremity assist;From bed Sit to Stand: Patient Percentage: 70% Stand to Sit: 1: +2 Total assist;With upper extremity  assist;With armrests;To chair/3-in-1 Stand to Sit: Patient Percentage: 70% Stand Pivot Transfers: 1: +2 Total assist Stand Pivot Transfers: Patient Percentage: 60% Details for Transfer Assistance: Assist to rise and steady with cues for R UE placement on platform and LUE placement on bed.  Pt able to take some steps from bed to chair with little increase in pain throughout, however +2 assist for safety. Ambulation/Gait Ambulation/Gait Assistance: 1: +2 Total assist Ambulation/Gait: Patient Percentage: 60% Ambulation Distance (Feet): 4 Feet Assistive device: Right platform walker Ambulation/Gait Assistance Details: Cues for sequencing/technique with R platform RW, safety, WBing through elbow only on RUE and upright posture.  Gait Pattern: Step-to pattern;Decreased stance time - right;Decreased step length - left;Decreased stride length Gait velocity: decreased Stairs: No Wheelchair Mobility Wheelchair Mobility: No    Shoulder Instructions     Exercises     PT Diagnosis: Difficulty walking;Generalized weakness;Acute pain  PT Problem List: Decreased strength;Decreased range of motion;Decreased activity tolerance;Decreased balance;Decreased mobility;Decreased knowledge of use of DME;Pain PT Treatment Interventions: DME instruction;Gait training;Stair training;Functional mobility training;Therapeutic activities;Therapeutic exercise;Balance training;Patient/family education   PT Goals Acute Rehab PT Goals PT Goal Formulation: With patient Time For Goal Achievement: 01/15/12 Potential to Achieve Goals: Good Pt will go Supine/Side to Sit: with supervision PT Goal: Supine/Side to Sit - Progress: Goal set today Pt will go Sit to Supine/Side: with supervision PT Goal: Sit to Supine/Side - Progress: Goal set today Pt will go Sit to Stand: with supervision PT Goal: Sit to Stand - Progress: Goal set today Pt  will go Stand to Sit: with supervision PT Goal: Stand to Sit - Progress: Goal set  today Pt will Ambulate: 16 - 50 feet;with min assist;with least restrictive assistive device PT Goal: Ambulate - Progress: Goal set today Pt will Go Up / Down Stairs: 1-2 stairs;with min assist;with rail(s) (If pt D/C';s home) PT Goal: Up/Down Stairs - Progress: Goal set today  Visit Information  Last PT Received On: 01/01/12 Assistance Needed: +2    Subjective Data  Subjective: I'm ready to get up out of this bed.  Patient Stated Goal: To get home.    Prior Functioning  Home Living Lives With: Spouse Available Help at Discharge: Family Type of Home: House Home Access: Stairs to enter [employer] of Steps: 2 Entrance Stairs-Rails: Can reach both Home Layout: One level Bathroom Shower/Tub: Tub/shower unit;Walk-in shower Bathroom Toilet:  (elevated) Home Adaptive Equipment: Bedside commode/3-in-1;Walker - rolling Prior Function Level of Independence: Independent Able to Take Stairs?: Yes Driving: Yes Vocation: Retired Corporate investment banker: No difficulties    Cognition  Overall Cognitive Status: Appears within functional limits for tasks assessed/performed Arousal/Alertness: Awake/alert Orientation Level: Appears intact for tasks assessed Behavior During Session: [facility] for tasks performed    Extremity/Trunk Assessment Right Lower Extremity Assessment RLE ROM/Strength/Tone: Unable to fully assess;Due to pain RLE Sensation: WFL - Light Touch RLE Coordination: WFL - gross/fine motor Left Lower Extremity Assessment LLE ROM/Strength/Tone: WFL for tasks assessed LLE Sensation: WFL - Light Touch LLE Coordination: WFL - gross/fine motor Trunk Assessment Trunk Assessment: Normal   Balance    End of Session PT - End of Session Equipment Utilized During Treatment: Gait belt (has soft brace on RUE, elevator sling following session) Activity Tolerance: Patient tolerated treatment well;Patient limited by pain Patient left: in chair;with call bell/phone within  reach;with family/visitor present Nurse Communication: Mobility status  GP     Page, Betha Loa 01/01/2012, 4:25 PM

## 2012-01-01 NOTE — Progress Notes (Addendum)
[facility] PROGRESS NOTE  Krystal Henderson [id number] DOB: 1932/07/27 DOA: 12/30/2011 PCP: [name], MD   Brief narrative:  78 year old female with past medical history significant for but not limited to hypertension, peripheral neuropathy, anxiety who presented to ED status post mechanical fall with non displaced right wrist fracture and Nondisplaced right obturator ring fracture.   Assessment/Plan:  Pelvic fracture  - pelvic and right obturator ring fracture  Ortho consult recommends weight being as tolerated. No surgical intervention needed  for obturator fracture.  - provide supportive care and analgesics  -PT eval Pending  Distal right radial fracture  - status post closed reduction in ED and x ray obtained post reduction. Continue sling. Seen by hand surgery . Recommend outpatient operative fixation. .   Chronic kidney disease, stage 3  - creatinine around baseline  continue lasix. holding losartan  HTN  Continue norvasc, holding lasix given AKI   AKI Unclear cause. Holding ARB, hold Lasix, check UA, urine Na  Peripheral neuropathy  - continue lyrica   Anxiety  - continue Xanax    Code Status: full code  Family Communication: none at bedside   Disposition Plan: pending PT eval    Consultants:  [name] ( hand surgery)  Procedures:  Closed reduction of right wrist fracture in ED.   Antibiotics:  none   HPI/Subjective: Denies any pain at rest. Waiting to see PT.  Objective: Filed Vitals:   01/01/12 0557 01/01/12 0800 01/01/12 1200 01/01/12 1355  BP: 124/70   114/65  Pulse: 60   64  Temp: 98.3 F (36.8 C)   98.3 F (36.8 C)  TempSrc: Oral   Oral  Resp: 16 16 16 16   Height:      Weight:      SpO2: 90%   96%    Intake/Output Summary (Last 24 hours) at 01/01/12 1412 Last data filed at 01/01/12 0557  Gross per 24 hour  Intake    360 ml  Output    925 ml  Net   -565 ml   Filed Weights   12/31/11 0128  Weight: 84.4 kg (186  lb 1.1 oz)    Exam:  General: Elderly female lying in bed in NAD  Heent: no pallor, moist oral mucosa . Laceration over rt forehead Cardiovascular: N S1&S2, no murmurs  Respiratory: clear breath sounds b/l, no added sounds  Abdomen: soft, NT, ND ,BS+ Ext; warm, sling over right wrist, trace edema, difficulty with abduction of legs and ambulation CNS AAOX3   Data Reviewed: Basic Metabolic Panel:  Lab [id number] [id number] 12/31/11 0403 12/30/11 2306  NA 137 136 135  K 4.8 4.5 4.6  CL 104 102 102  CO2 27 28 24   GLUCOSE 93 120* 116*  BUN 21 16 18   CREATININE 1.61* 1.28* 1.22*  CALCIUM 8.6 8.6 8.7  MG -- -- --  PHOS -- -- --   Liver Function Tests: No results found for this basename: AST:5,ALT:5,ALKPHOS:5,BILITOT:5,PROT:5,ALBUMIN:5 in the last 168 hours No results found for this basename: LIPASE:5,AMYLASE:5 in the last 168 hours No results found for this basename: AMMONIA:5 in the last 168 hours CBC:  Lab 01/01/12 0526 12/31/11 0403 12/30/11 2306  WBC 4.7 6.7 8.8  NEUTROABS -- -- --  HGB 10.5* 10.6* 11.1*  HCT 32.3* 32.4* 33.1*  MCV 91.5 90.8 89.7  PLT 235 261 283   Cardiac Enzymes: No results found for this basename: CKTOTAL:5,CKMB:5,CKMBINDEX:5,TROPONINI:5 in the last 168 hours BNP (last 3 results) No results found  for this basename: PROBNP:3 in the last 8760 hours CBG:  Lab 12/31/11 0742  GLUCAP 94    No results found for this or any previous visit (from the past 240 hour(s)).   Studies: Dg Wrist Complete Right  12/30/2011  *RADIOLOGY REPORT*  Clinical Data: Right wrist fracture status post reduction.  RIGHT WRIST - COMPLETE 3+ VIEW  Comparison: 12/30/2011.  Findings: Compared to the prior examination there has been interval closed reduction and cast fixation for the distal radial and ulnar fractures.  This cast now obscures fine bony detail.  However, there is significantly improved alignment, which is now near anatomic.  The highly comminuted intra-articular fracture  of the distal right radius in particular is markedly improved.  A small ulnar styloid avulsion fracture.  IMPRESSION: 1.  Status post closed reduction and cast fixation for distal radial and ulnar fractures was significantly improved alignment, as above.   Original Report Authenticated By: Etheleen Mayhew, M.D.    Dg Wrist Complete Right  12/30/2011  *RADIOLOGY REPORT*  Clinical Data: Fall, laceration  RIGHT WRIST - COMPLETE 3+ VIEW  Comparison: None.  Findings: Four views of the right wrist submitted.  There is displaced impacted comminuted fracture of distal right radius. Probable subtle fracture of tip of the ulnar styloid.  IMPRESSION: Displaced impacted comminuted fracture of distal right radius. Probable subtle fracture of tip of ulnar styloid.   Original Report Authenticated By: Lahoma Crocker, M.D.    Dg Hip Complete Right  12/30/2011  *RADIOLOGY REPORT*  Clinical Data: Fall, laceration  RIGHT HIP - COMPLETE 2+ VIEW  Comparison: None.  Findings: Three views of the right hip submitted.  No acute fracture or subluxation.  There is right hip prosthesis  in anatomic alignment.  IMPRESSION: No acute fracture or subluxation.  Right hip prosthesis  in anatomic alignment.   Original Report Authenticated By: Lahoma Crocker, M.D.    Ct Wrist Right Wo Contrast  12/31/2011  *RADIOLOGY REPORT*  Clinical Data: Further evaluation of a distal right radius fracture  CT OF THE RIGHT WRIST WITHOUT CONTRAST  Technique:  Multidetector CT imaging was performed according to the standard protocol. Multiplanar CT image reconstructions were also generated.  Comparison: 12/30/2011  Findings: There is a comminuted fracture of the distal radius.  The primary fracture line is transverse, across the metaphysis.  There are secondary fracture lines that intersect the articular surface, between the scaphoid and lunate facets and across the center of the lunate facet.  This yields a comminuted fracture fragments involving a portion of the  lunate facet that is minimally depressed, by 3 mm.  The fractures also lead to the dorsal angulation of the articular surface of 20 degrees.  Radial inclination is maintained as is overall radial length.  There are too small bony fragments adjacent to the ulnar styloid consistent with small avulsion fractures. This could reflect acute fractures or be chronic.  No other fractures. There are some small cysts and several carpal bones most notably the lunate.  This is most evident along the dorsal ulnar aspect of the lunate.  There is surrounding soft tissue swelling.  IMPRESSION: Comminuted fracture of the distal right radius with dorsal angulation of the articular surface as detailed above.  Small bony fragments adjacent to the ulnar styloid may reflect additional acute fractures or be chronic.   Original Report Authenticated By: [name], M.D.    Ct Hip Right Wo Contrast  12/30/2011  *RADIOLOGY REPORT*  Clinical Data: Right hip pain  following a fall.  CT OF THE RIGHT HIP WITHOUT CONTRAST  Technique:  Multidetector CT imaging was performed according to the standard protocol. Multiplanar CT image reconstructions were also generated.  Comparison: 12/30/2011.  Findings: Uncomplicated right total hip arthroplasty is present. Distal femoral stem visualized and appears within normal limits. There is no evidence of hardware loosening or failure. Polyethylene spacer appears within normal limits.  There is a nondisplaced fracture of the right obturator ring. Nondisplaced fracture of the parasymphyseal pubis is present and a nondisplaced fracture of the inferior pubic ramus.  These fractures are radiographically occult on the prior exam.  Atherosclerosis is incidentally noted.  On sagittal imaging, there is buckling of the inferior sacrum, suggesting nondisplaced complimentary sacral fracture although this is only seen on sagittal images.  IMPRESSION: 1.  Uncomplicated right total hip arthroplasty.  No periprosthetic  fracture. 2.  Nondisplaced right obturator ring fracture. 3.  Partially visualized buckle fracture of the sacrum at about the S3 level.  This fracture with complement the right obturator ring fractures.   Original Report Authenticated By: Dereck Ligas, M.D.     Scheduled Meds:   . amLODipine  5 mg Oral Daily  . aspirin  81 mg Oral Daily  . cholecalciferol  2,000 Units Oral Daily  . enoxaparin (LOVENOX) injection  30 mg Subcutaneous Q12H  . influenza  inactive virus vaccine  0.5 mL Intramuscular Tomorrow-1000  . meclizine  12.5 mg Oral Daily  . multivitamin with minerals  1 tablet Oral Daily  . pneumococcal 23 valent vaccine  0.5 mL Intramuscular Tomorrow-1000  . pregabalin  100 mg Oral QHS  . pregabalin  25 mg Oral BID  . vitamin C  1,000 mg Oral Daily  . DISCONTD: furosemide  20 mg Oral Daily   Continuous Infusions:   Active Problems:  Pelvic fracture    Time spent: 30 minutes    Kaija Kovacevic, [place]  [facility] Pager [phone]. If 8PM-8AM, please contact night-coverage at [email or web address], password [facility] 01/01/2012, 2:12 PM  LOS: 2 days

## 2012-01-02 DIAGNOSIS — D649 Anemia, unspecified: Secondary | ICD-10-CM

## 2012-01-02 LAB — BASIC METABOLIC PANEL
BUN: 17 mg/dL (ref 6–23)
CO2: 27 mEq/L (ref 19–32)
Calcium: 8.6 mg/dL (ref 8.4–10.5)
Chloride: 104 mEq/L (ref 96–112)
Creatinine, Ser: 1.31 mg/dL — ABNORMAL HIGH (ref 0.50–1.10)
GFR calc Af Amer: 44 mL/min — ABNORMAL LOW (ref >90)

## 2012-01-02 LAB — CBC
HCT: 31.6 % — ABNORMAL LOW (ref 36.0–46.0)
MCH: 29.9 pg (ref 26.0–34.0)
MCV: 90.8 fL (ref 78.0–100.0)
Platelets: 244 10*3/uL (ref 150–400)
RDW: 14.9 % (ref 11.5–15.5)
WBC: 5.4 10*3/uL (ref 4.0–10.5)

## 2012-01-02 MED ORDER — POLYETHYLENE GLYCOL 3350 17 G PO PACK
17.0000 g | PACK | Freq: Every day | ORAL | Status: DC
  Administered 2012-01-02 – 2012-01-03 (×2): 17 g via ORAL

## 2012-01-02 MED ORDER — BISACODYL 10 MG RE SUPP
10.0000 mg | Freq: Once | RECTAL | Status: AC
  Administered 2012-01-02: 10 mg via RECTAL
  Filled 2012-01-02: qty 1

## 2012-01-02 MED ORDER — MECLIZINE HCL 12.5 MG PO TABS
12.5000 mg | ORAL_TABLET | Freq: Every day | ORAL | Status: DC | PRN
  Filled 2012-01-02: qty 1

## 2012-01-02 NOTE — Progress Notes (Signed)
[facility] PROGRESS NOTE  Krystal Henderson [id number] DOB: 07/15/1932 DOA: 12/30/2011 PCP: [name], MD  Brief narrative:  78 year old female with past medical history significant for but not limited to hypertension, peripheral neuropathy, anxiety who presented to ED status post mechanical fall with non displaced right wrist fracture and Nondisplaced right obturator ring fracture.   Assessment/Plan:  Pelvic fracture  - pelvic and right obturator ring fracture  Ortho consult recommends weight being as tolerated. No surgical intervention needed for obturator fracture.  - provide supportive care and analgesics  -PT recommends SNF vs inpatient rehab   Distal right radial fracture  - status post closed reduction in ED and x ray obtained post reduction. Continue sling. Seen by hand surgery . Recommend outpatient operative fixation. .  -will discuss with [name] about timing  on surgery . She is supposed to see him in the clinic tomorrow. -ortho provided sling with hand elevation.   HTN  Continue norvasc, holding lasix given AKI   AKI on CKD  Unclear cause. Holding ARB, hold Lasix, UA unremarkable.   Peripheral neuropathy  - continue lyrica   Anxiety  - continue Xanax    Code Status: full code   Family Communication: none at bedside   Disposition Plan: SNF  Consultants:  [name] ( hand surgery)  Ortho ( Dr [name]) Procedures:  Closed reduction of right wrist fracture in ED. Antibiotics:  none   HPI/Subjective:  Denies any pain at rest   Objective: Filed Vitals:   01/02/12 0400 01/02/12 0543 01/02/12 0800 01/02/12 1200  BP:  146/74    Pulse:  66    Temp:  97.9 F (36.6 C)    TempSrc:  Oral    Resp: 18 18 16 16   Height:      Weight:      SpO2: 94% 94%      Intake/Output Summary (Last 24 hours) at 01/02/12 1402 Last data filed at 01/02/12 1300  Gross per 24 hour  Intake    480 ml  Output   2400 ml  Net  -1920 ml   Filed Weights   12/31/11 0128  Weight: 84.4 kg (186 lb 1.1 oz)    Exam:  General: Elderly female lying in bed in NAD  Heent: no pallor, moist oral mucosa . Laceration over rt forehead  Cardiovascular: N S1&S2, no murmurs  Respiratory: clear breath sounds b/l, no added sounds  Abdomen: soft, NT, ND ,BS+  Ext; warm, sling over right wrist, trace edema, able to weight bear but ambulate with difficulty CNS AAOX3   Data Reviewed: Basic Metabolic Panel:  Lab [id number] 01/01/12 0526 12/31/11 0403 12/30/11 2306  NA 137 137 136 135  K 4.2 4.8 4.5 4.6  CL 104 104 102 102  CO2 27 27 28 24   GLUCOSE 91 93 120* 116*  BUN 17 21 16 18   CREATININE 1.31* 1.61* 1.28* 1.22*  CALCIUM 8.6 8.6 8.6 8.7  MG -- -- -- --  PHOS -- -- -- --   Liver Function Tests: No results found for this basename: AST:5,ALT:5,ALKPHOS:5,BILITOT:5,PROT:5,ALBUMIN:5 in the last 168 hours No results found for this basename: LIPASE:5,AMYLASE:5 in the last 168 hours No results found for this basename: AMMONIA:5 in the last 168 hours CBC:  Lab 01/02/12 0445 01/01/12 0526 12/31/11 0403 12/30/11 2306  WBC 5.4 4.7 6.7 8.8  NEUTROABS -- -- -- --  HGB 10.4* 10.5* 10.6* 11.1*  HCT 31.6* 32.3* 32.4* 33.1*  MCV 90.8 91.5 90.8  89.7  PLT 244 235 261 283   Cardiac Enzymes: No results found for this basename: CKTOTAL:5,CKMB:5,CKMBINDEX:5,TROPONINI:5 in the last 168 hours BNP (last 3 results) No results found for this basename: PROBNP:3 in the last 8760 hours CBG:  Lab 12/31/11 0742  GLUCAP 94    No results found for this or any previous visit (from the past 240 hour(s)).   Studies: Ct Wrist Right Wo Contrast  12/31/2011  *RADIOLOGY REPORT*  Clinical Data: Further evaluation of a distal right radius fracture  CT OF THE RIGHT WRIST WITHOUT CONTRAST  Technique:  Multidetector CT imaging was performed according to the standard protocol. Multiplanar CT image reconstructions were also generated.  Comparison: 12/30/2011  Findings: There is  a comminuted fracture of the distal radius.  The primary fracture line is transverse, across the metaphysis.  There are secondary fracture lines that intersect the articular surface, between the scaphoid and lunate facets and across the center of the lunate facet.  This yields a comminuted fracture fragments involving a portion of the lunate facet that is minimally depressed, by 3 mm.  The fractures also lead to the dorsal angulation of the articular surface of 20 degrees.  Radial inclination is maintained as is overall radial length.  There are too small bony fragments adjacent to the ulnar styloid consistent with small avulsion fractures. This could reflect acute fractures or be chronic.  No other fractures. There are some small cysts and several carpal bones most notably the lunate.  This is most evident along the dorsal ulnar aspect of the lunate.  There is surrounding soft tissue swelling.  IMPRESSION: Comminuted fracture of the distal right radius with dorsal angulation of the articular surface as detailed above.  Small bony fragments adjacent to the ulnar styloid may reflect additional acute fractures or be chronic.   Original Report Authenticated By: [name], M.D.     Scheduled Meds:   . amLODipine  5 mg Oral Daily  . aspirin EC  81 mg Oral Daily  . bisacodyl  10 mg Rectal Once  . cholecalciferol  2,000 Units Oral Daily  . enoxaparin (LOVENOX) injection  30 mg Subcutaneous Q12H  . multivitamin with minerals  1 tablet Oral Daily  . polyethylene glycol  17 g Oral Daily  . pregabalin  100 mg Oral QHS  . pregabalin  25 mg Oral BID  . vitamin C  1,000 mg Oral Daily  . DISCONTD: aspirin  81 mg Oral Daily  . DISCONTD: aspirin  81 mg Oral Daily  . DISCONTD: enoxaparin (LOVENOX) injection  40 mg Subcutaneous Q24H  . DISCONTD: meclizine  12.5 mg Oral Daily   Continuous Infusions:     Time spent: 25 minutes    [name]  [facility] Pager [phone]. If 8PM-8AM, please  contact night-coverage at [email or web address], password [email or web address] 01/02/2012, 2:02 PM  LOS: 3 days

## 2012-01-02 NOTE — Consult Note (Signed)
Reason for Consult:pelvic fracture Referring Physician: Dhungel  Krystal Henderson is an 78 y.o. female.  HPI:{Patient fell ay [employer] on 12/30/11 sustaining an injury to her right pelvis and right wrist fracture. No LOC.  Past Medical History  Diagnosis Date  . HTN (hypertension)   . Hyperlipemia 2006    LDL 130  . Esophageal reflux     inactive  . Anxiety   . Hypoglycemia     reactive  . Peripheral neuropathy   . Vitamin B12 deficiency   . Personal history of colonic polyps     adenomatous  . Diverticulosis of colon (without mention of hemorrhage)   . DJD (degenerative joint disease)   . Osteoporosis   . Angiodysplasia 2007    @ colonoscopy  . Pneumonia     hx of pneumonia   . Anemia     hx of     Past Surgical History  Procedure Date  . Tear duct probing     X 2  . Total hip arthroplasty 2000    right  . Facial cosmetic surgery   . Tubal ligation   . Hemorroidectomy   . Dilation and curettage of uterus   . Colonoscopy w/ polypectomy     X 2 , Dr  Verl Blalock; angiodysplasia. Due 2022  . Lumbar laminectomy/decompression microdiscectomy 09/10/2011    Procedure: LUMBAR LAMINECTOMY/DECOMPRESSION MICRODISCECTOMY;  Surgeon: Johnn Hai, MD;  Location: [facility];  Service: Orthopedics;  Laterality: N/A;  decompression laminectomy L2-3, L3-4, L4-5    Family History  Problem Relation Age of Onset  . Colon cancer Neg Hx   . Diabetes Neg Hx   . Heart disease Father 66    MI  . Kidney disease Mother     renal failure  . Throat cancer Sister     smoker  . Osteoporosis Sister   . Heart attack Brother 2  . Stroke Brother 76    smoker  . Depression Maternal Uncle   . Cancer Brother     X3  lung cancer, all smokers  . Peripheral vascular disease Daughter     Social History:  reports that she has never smoked. She has never used smokeless tobacco. She reports that she drinks alcohol. She reports that she does not use illicit drugs.  Allergies:    Allergies  Allergen Reactions  . Codeine     REACTION: nausea  . Duloxetine     REACTION: intolerance  . Sertraline Hcl     REACTION: intolerance    Medications: I have reviewed the patient's current medications.  Results for orders placed during the hospital encounter of 12/30/11 (from the past 48 hour(s))  CBC     Status: Abnormal   Collection Time   01/01/12  5:26 AM      Component Value Range Comment   WBC 4.7  4.0 - 10.5 K/uL    RBC 3.53 (*) 3.87 - 5.11 MIL/uL    Hemoglobin 10.5 (*) 12.0 - 15.0 g/dL    HCT 32.3 (*) 36.0 - 46.0 %    MCV 91.5  78.0 - 100.0 fL    MCH 29.7  26.0 - 34.0 pg    MCHC 32.5  30.0 - 36.0 g/dL    RDW 15.0  11.5 - 15.5 %    Platelets 235  150 - 400 K/uL   BASIC METABOLIC PANEL     Status: Abnormal   Collection Time   01/01/12  5:26 AM  Component Value Range Comment   Sodium 137  135 - 145 mEq/L    Potassium 4.8  3.5 - 5.1 mEq/L SLIGHT HEMOLYSIS   Chloride 104  96 - 112 mEq/L    CO2 27  19 - 32 mEq/L    Glucose, Bld 93  70 - 99 mg/dL    BUN 21  6 - 23 mg/dL    Creatinine, Ser 1.61 (*) 0.50 - 1.10 mg/dL    Calcium 8.6  8.4 - 10.5 mg/dL    GFR calc non Af Amer 30 (*) >90 mL/min    GFR calc Af Amer 34 (*) >90 mL/min   URINALYSIS, ROUTINE W REFLEX MICROSCOPIC     Status: Abnormal   Collection Time   01/01/12  4:33 PM      Component Value Range Comment   Color, Urine YELLOW  YELLOW    APPearance CLEAR  CLEAR    Specific Gravity, Urine 1.011  1.005 - 1.030    pH 6.0  5.0 - 8.0    Glucose, UA NEGATIVE  NEGATIVE mg/dL    Hgb urine dipstick NEGATIVE  NEGATIVE    Bilirubin Urine NEGATIVE  NEGATIVE    Ketones, ur NEGATIVE  NEGATIVE mg/dL    Protein, ur NEGATIVE  NEGATIVE mg/dL    Urobilinogen, UA 0.2  0.0 - 1.0 mg/dL    Nitrite NEGATIVE  NEGATIVE    Leukocytes, UA SMALL (*) NEGATIVE   URINE MICROSCOPIC-ADD ON     Status: Normal   Collection Time   01/01/12  4:33 PM      Component Value Range Comment   WBC, UA 3-6  <3 WBC/hpf   CBC      Status: Abnormal   Collection Time   01/02/12  4:45 AM      Component Value Range Comment   WBC 5.4  4.0 - 10.5 K/uL    RBC 3.48 (*) 3.87 - 5.11 MIL/uL    Hemoglobin 10.4 (*) 12.0 - 15.0 g/dL    HCT 31.6 (*) 36.0 - 46.0 %    MCV 90.8  78.0 - 100.0 fL    MCH 29.9  26.0 - 34.0 pg    MCHC 32.9  30.0 - 36.0 g/dL    RDW 14.9  11.5 - 15.5 %    Platelets 244  150 - 400 K/uL   BASIC METABOLIC PANEL     Status: Abnormal   Collection Time   01/02/12  4:45 AM      Component Value Range Comment   Sodium 137  135 - 145 mEq/L    Potassium 4.2  3.5 - 5.1 mEq/L    Chloride 104  96 - 112 mEq/L    CO2 27  19 - 32 mEq/L    Glucose, Bld 91  70 - 99 mg/dL    BUN 17  6 - 23 mg/dL    Creatinine, Ser 1.31 (*) 0.50 - 1.10 mg/dL    Calcium 8.6  8.4 - 10.5 mg/dL    GFR calc non Af Amer 38 (*) >90 mL/min    GFR calc Af Amer 44 (*) >90 mL/min     Ct Wrist Right Wo Contrast  12/31/2011  *RADIOLOGY REPORT*  Clinical Data: Further evaluation of a distal right radius fracture  CT OF THE RIGHT WRIST WITHOUT CONTRAST  Technique:  Multidetector CT imaging was performed according to the standard protocol. Multiplanar CT image reconstructions were also generated.  Comparison: 12/30/2011  Findings: There is a comminuted fracture of the  distal radius.  The primary fracture line is transverse, across the metaphysis.  There are secondary fracture lines that intersect the articular surface, between the scaphoid and lunate facets and across the center of the lunate facet.  This yields a comminuted fracture fragments involving a portion of the lunate facet that is minimally depressed, by 3 mm.  The fractures also lead to the dorsal angulation of the articular surface of 20 degrees.  Radial inclination is maintained as is overall radial length.  There are too small bony fragments adjacent to the ulnar styloid consistent with small avulsion fractures. This could reflect acute fractures or be chronic.  No other fractures. There are  some small cysts and several carpal bones most notably the lunate.  This is most evident along the dorsal ulnar aspect of the lunate.  There is surrounding soft tissue swelling.  IMPRESSION: Comminuted fracture of the distal right radius with dorsal angulation of the articular surface as detailed above.  Small bony fragments adjacent to the ulnar styloid may reflect additional acute fractures or be chronic.   Original Report Authenticated By: [name], M.D.     ROS Blood pressure 146/74, pulse 66, temperature 97.9 F (36.6 C), temperature source Oral, resp. rate 18, height 5\' 6"  (1.676 m), weight 84.4 kg (186 lb 1.1 oz), SpO2 94.00%. Physical Exam  Constitutional: She is oriented to person, place, and time. She appears well-developed and well-nourished.  Eyes: EOM are normal.  Cardiovascular: Normal rate and intact distal pulses.   Respiratory: Effort normal.  Musculoskeletal:       Lower extremities without gross deformities. AFROM bilateral ankles, Lower legs non tender.Sensation intact bilateral feet to light touch  Splint right forearm fits well fingers NVI    Neurological: She is alert and oriented to person, place, and time.  Skin: Skin is warm and dry.    Assessment/Plan: Nondisplaced right obturator ring fracture. Partially visualized buckle fracture of the sacrum at about the  S3 level. This fracture with complement the right obturator ring fractures. Patient WBAT  From the standpoint of her right obturator ring fracture. Needs folow-up with Dr. Beola Cord in office in 1 month call [phone] for appointment. Patient seen and evaluated by Dr. Beola Cord.    Erskine Emery 01/02/2012, 10:32 AM

## 2012-01-02 NOTE — Progress Notes (Addendum)
Physical Therapy Treatment Patient Details Name: Krystal Henderson MRN: [id number] DOB: 1932/12/19 Today's Date: 01/02/2012 Time: [id number] PT Time Calculation (min): 25 min  PT Assessment / Plan / Recommendation Comments on Treatment Session  Pt progressing slowly, however was able to ambulate to doorway this morning.  She also mentioned that she got dizzy when ambulating.  BP taken and was 90/57.  RN notified. Spoke with RN and rounding PA that CIR may be beneficial once has wrist sx and needs OT.  However, unsure if pt will be in hospital until time of wrist sx.      Follow Up Recommendations  Home health PT;Skilled nursing facility;Inpatient Rehab    Barriers to Discharge        Equipment Recommendations  Other (comment) (will need R platform, has RW. )    Recommendations for Other Services OT consult (if here until wrist sx. )  Frequency Min 6X/week   Plan Discharge plan needs to be updated    Precautions / Restrictions Precautions Precautions: Fall Restrictions Weight Bearing Restrictions: No RUE Weight Bearing: Weight bear through elbow only Other Position/Activity Restrictions: WBAT for RLE   Pertinent Vitals/Pain 5/10    Mobility  Bed Mobility Bed Mobility: Supine to Sit;Sitting - Scoot to Edge of Bed Supine to Sit: 4: Min assist;3: Mod assist;HOB elevated Details for Bed Mobility Assistance: Assist for RLE out of bed with some minor assist for trunk to attain sitting position.  Cues for hand placement on bed due to pt wanting to reach for foot of bed to assist.  Transfers Transfers: Sit to Stand;Stand to Sit;Stand Pivot Transfers Sit to Stand: 4: Min assist;From elevated surface;With upper extremity assist;From bed Stand to Sit: 4: Min assist;With upper extremity assist;With armrests;To chair/3-in-1 Details for Transfer Assistance: Assist to rise and steady with cues for hand placement when sitting/standing.  Also some assist for RUE off of platform RW.     Ambulation/Gait Ambulation/Gait Assistance: 1: +2 Total assist Ambulation/Gait: Patient Percentage: 60% Ambulation Distance (Feet): 12 Feet Assistive device: Right platform walker Ambulation/Gait Assistance Details: Assist for steadying throughout with cues for sequencing/technique with platform RW, to maintain upright posture and for maintaining WB through elbow only on RUE.  Gait Pattern: Step-to pattern;Decreased stance time - right;Decreased step length - left;Decreased stride length Gait velocity: decreased    Exercises     PT Diagnosis:    PT Problem List:   PT Treatment Interventions:     PT Goals Acute Rehab PT Goals PT Goal Formulation: With patient Time For Goal Achievement: 01/15/12 Potential to Achieve Goals: Good Pt will go Supine/Side to Sit: with supervision PT Goal: Supine/Side to Sit - Progress: Progressing toward goal Pt will go Sit to Stand: with supervision PT Goal: Sit to Stand - Progress: Progressing toward goal Pt will go Stand to Sit: with supervision PT Goal: Stand to Sit - Progress: Progressing toward goal Pt will Ambulate: 16 - 50 feet;with min assist;with least restrictive assistive device PT Goal: Ambulate - Progress: Progressing toward goal  Visit Information  Last PT Received On: 01/02/12 Assistance Needed: +2    Subjective Data  Subjective: I feel dizzy (while ambulating) Patient Stated Goal: To get home.    Cognition  Overall Cognitive Status: Appears within functional limits for tasks assessed/performed Arousal/Alertness: Awake/alert Orientation Level: Appears intact for tasks assessed Behavior During Session: [facility] for tasks performed    Balance  Balance Balance Assessed: Yes Static Standing Balance Static Standing - Balance Support: Right upper extremity supported;Left upper  extremity supported Static Standing - Level of Assistance: 4: Min assist Static Standing - Comment/# of Minutes: Stood at EOB x approx 4-5 mins while tech  assisted with self care.   End of Session PT - End of Session Equipment Utilized During Treatment: Gait belt Activity Tolerance: Patient tolerated treatment well (Limited by dizziness) Patient left: in chair;with call bell/phone within reach Nurse Communication: Mobility status   GP     Page, Betha Loa 01/02/2012, 10:06 AM

## 2012-01-03 ENCOUNTER — Ambulatory Visit: Payer: Medicare Other | Admitting: Physical Therapy

## 2012-01-03 DIAGNOSIS — S0181XA Laceration without foreign body of other part of head, initial encounter: Secondary | ICD-10-CM | POA: Diagnosis present

## 2012-01-03 DIAGNOSIS — S5290XA Unspecified fracture of unspecified forearm, initial encounter for closed fracture: Secondary | ICD-10-CM | POA: Diagnosis present

## 2012-01-03 DIAGNOSIS — I10 Essential (primary) hypertension: Secondary | ICD-10-CM | POA: Diagnosis present

## 2012-01-03 LAB — BASIC METABOLIC PANEL
BUN: 16 mg/dL (ref 6–23)
CO2: 29 mEq/L (ref 19–32)
Calcium: 9.1 mg/dL (ref 8.4–10.5)
Chloride: 100 mEq/L (ref 96–112)
Creatinine, Ser: 1.24 mg/dL — ABNORMAL HIGH (ref 0.50–1.10)
GFR calc Af Amer: 47 mL/min — ABNORMAL LOW (ref >90)
GFR calc non Af Amer: 41 mL/min — ABNORMAL LOW (ref >90)
Glucose, Bld: 97 mg/dL (ref 70–99)
Potassium: 4 mEq/L (ref 3.5–5.1)
Sodium: 137 mEq/L (ref 135–145)

## 2012-01-03 MED ORDER — POLYETHYLENE GLYCOL 3350 17 G PO PACK: 17.0000 g | PACK | Freq: Every day | ORAL | Status: DC

## 2012-01-03 MED ORDER — FUROSEMIDE 20 MG PO TABS: 20.0000 mg | ORAL_TABLET | ORAL | Status: DC

## 2012-01-03 MED ORDER — HYDROCODONE-ACETAMINOPHEN 5-325 MG PO TABS: 2.0000 | ORAL_TABLET | Freq: Four times a day (QID) | ORAL | Status: DC | PRN

## 2012-01-03 NOTE — Progress Notes (Signed)
Pt d/c'd to [facility].

## 2012-01-03 NOTE — Progress Notes (Signed)
Physical Therapy Treatment Patient Details Name: Krystal Henderson MRN: [id number] DOB: 1932-05-18 Today's Date: 01/03/2012 Time: [id number] PT Time Calculation (min): 24 min  PT Assessment / Plan / Recommendation Comments on Treatment Session  Pt able to tolerate increased ambulation distance today (reports no dizziness with mobility) however very slow gait speed.  Pt also performed LE exercises with decreased active R hip ROM observed.  No pain with exercises.    Follow Up Recommendations  Post acute inpatient rehab    Barriers to Discharge        Equipment Recommendations  Other (comment) (platform RW in room)    Recommendations for Other Services    Frequency     Plan Discharge plan remains appropriate;Frequency remains appropriate    Precautions / Restrictions Precautions Precautions: Fall Restrictions Weight Bearing Restrictions: Yes RUE Weight Bearing: Non weight bearing Other Position/Activity Restrictions: WBAT for RLE   Pertinent Vitals/Pain 4/10 R wrist pain, RN in medicating pt and aware    Mobility  Transfers Transfers: Sit to Stand;Stand to Sit Sit to Stand: 4: Min guard;From chair/3-in-1;With upper extremity assist Stand to Sit: 4: Min guard;With upper extremity assist;To chair/3-in-1 Details for Transfer Assistance: verbal cues for safe technique Ambulation/Gait Ambulation/Gait Assistance: 4: Min guard Ambulation Distance (Feet): 50 Feet Assistive device: Right platform walker Ambulation/Gait Assistance Details: verbal cues for posture, WBing through elbow only, pt requires increased time to ambulate, also adjusted RW and platform a few times to adjust for pt comfort Gait Pattern: Step-to pattern;Decreased stride length;Decreased stance time - right Gait velocity: decreased    Exercises General Exercises - Lower Extremity Ankle Circles/Pumps: AROM;20 reps;Both Long Arc Quad: AROM;Strengthening;Both;20 reps Hip ABduction/ADduction:  AROM;Strengthening;AAROM;Both;20 reps;Limitations Hip Abduction/Adduction Limitations: R LE required AA , performed half range, no pain Hip Flexion/Marching: AROM;Strengthening;Both;20 reps;Limitations Hip Flexion/Marching Limitations: performed half range R LE, no pain   PT Diagnosis:    PT Problem List:   PT Treatment Interventions:     PT Goals Acute Rehab PT Goals PT Goal: Sit to Stand - Progress: Progressing toward goal PT Goal: Stand to Sit - Progress: Progressing toward goal Pt will Ambulate: 51 - 150 feet;with supervision;with least restrictive assistive device PT Goal: Ambulate - Progress: Updated due to goal met  Visit Information  Last PT Received On: 01/03/12 Assistance Needed: +1    Subjective Data  Subjective: Can you adjust that walker, they just brought it.   Cognition  Overall Cognitive Status: Appears within functional limits for tasks assessed/performed    Balance     End of Session PT - End of Session Activity Tolerance: Patient tolerated treatment well Patient left: in chair;with call bell/phone within reach;with nursing in room   GP     [facility] E 01/03/2012, 12:39 PM Pager: [phone]

## 2012-01-03 NOTE — Discharge Summary (Signed)
Physician Discharge Summary  Krystal Henderson [id number] DOB: Nov 14, 1932 DOA: 12/30/2011  PCP: Krystal Cobble, MD  Admit date: 12/30/2011 Discharge date: 01/03/2012  Recommendations for Outpatient Follow-up:  1. D/c to inpatient rehab vs SNF.  2. Follow up with [name] on 10/4 at 1 pm. Follow up with [name] in 1 month.  3. Please check BMET in 1 week. If renal function stable, resume losartan.   Discharge Diagnoses:  Principal Problem:  *Closed fracture of radius  Active Problems:  Acute kidney injury  Pelvic fracture  Facial laceration  Hypertension   Discharge Condition:Fair  Diet recommendation: low sodium  Filed Weights   12/31/11 0128  Weight: 84.4 kg (186 lb 1.1 oz)    History of present illness:   78 year old female with past medical history significant for but not limited to hypertension, peripheral neuropathy, anxiety who presented to ED status post mechanical fall with non displaced right wrist fracture and Nondisplaced right obturator ring fracture.   Hospital Course:    Pelvic fracture  - pelvic and right obturator ring fracture  Ortho consult recommends weight being as tolerated. No surgical intervention needed for obturator fracture.  - provide supportive care and analgesics  -PT recommends SNF vs inpatient rehab   Distal right radial fracture  - status post closed reduction in ED and x ray obtained post reduction. Continue sling. Seen by hand surgery [name] . Recommend outpatient operative fixation. .  -since patient going to rehab he will see her on 10/4 at 1 pm and schedule surgery for next week -ortho provided sling with hand elevation.   HTN  Continue norvasc and lasix every other day. Holding ARB.  AKI on CKD  Unclear cause. , UA unremarkable. Held both lasix and ARB. Now resolved. Resume lasix . Hold ARB until repeat BMET in 1 week and if stable can be resumed.   Peripheral neuropathy  - continue lyrica  Anxiety  -  continue Xanax    Code Status: full code   Consultants:  [name] ( hand surgery)  Ortho ( Dr [name])   Procedures:  Closed reduction of right wrist fracture in ED.    Discharge Exam: Filed Vitals:   01/02/12 1600 01/02/12 2130 01/03/12 0405 01/03/12 0800  BP:  116/48 131/71   Pulse:  85 68   Temp:  98.3 F (36.8 C) 98 F (36.7 C)   TempSrc:  Oral Oral   Resp: 16 16 16 15   Height:      Weight:      SpO2:  92% 94%     General: Elderly female lying in bed in NAD  Heent: no pallor, moist oral mucosa . Laceration over rt forehead  Cardiovascular: N S1&S2, no murmurs Respiratory: clear breath sounds b/l, no added sounds  Abdomen: soft, NT, ND ,BS+  Ext; warm, sling over right wrist, trace edema, difficulty with abduction of legs and ambulation  CNS AAOX3   Discharge Instructions  Discharge Orders    Future Appointments: Provider: Department: Dept Phone: Center:   03/15/2012 9:30 AM [name], MD [facility] [phone] [facility]       Medication List     As of 01/03/2012 11:06 AM    STOP taking these medications         losartan 100 MG tablet   Commonly known as: COZAAR      TAKE these medications         ALPRAZolam 0.25 MG tablet   Commonly known  as: XANAX   Take 0.5-1 mg by mouth at bedtime as needed. Anxiety      amLODipine 5 MG tablet   Commonly known as: NORVASC   Take 5 mg by mouth daily.      aspirin 81 MG tablet   Take 81 mg by mouth daily.      CALCIUM 600 PO   Take 1 capsule by mouth daily.      Fish Oil 1000 MG Caps   Take 1 capsule by mouth daily.      furosemide 20 MG tablet   Commonly known as: LASIX   Take 1 tablet (20 mg total) by mouth 3 (three) times a week.      HYDROcodone-acetaminophen 5-325 MG per tablet   Commonly known as: NORCO/VICODIN   Take 2 tablets by mouth every 6 (six) hours as needed.      meclizine 25 MG tablet   Commonly known as: ANTIVERT   Take 12.5 mg by mouth daily.      multivitamin  with minerals Tabs   Take 1 tablet by mouth daily.      polyethylene glycol packet   Commonly known as: MIRALAX / GLYCOLAX   Take 17 g by mouth daily.      pregabalin 100 MG capsule   Commonly known as: LYRICA   Take 100 mg by mouth at bedtime.      pregabalin 25 MG capsule   Commonly known as: LYRICA   Take 25 mg by mouth 2 (two) times daily. One in the morning and one at noon      vitamin C 1000 MG tablet   Take 1,000 mg by mouth daily.      Vitamin D 2000 UNITS Caps   Take 1 capsule by mouth daily.           Follow-up Information    Follow up with Krystal Cobble, MD. In 1 week.   Contact information:   [address] [facility] [address] [place] [address] [phone]       Follow up with Tennis Must, MD. On 01/07/2012. (at 1 pm)    Contact information:   [place] [address] [phone]       Follow up with BEDNARZ,PAUL A, MD. Schedule an appointment as soon as possible for a visit in 4 weeks.   Contact information:   [facility] [address] [place] [address] [id number]           The results of significant diagnostics from this hospitalization (including imaging, microbiology, ancillary and laboratory) are listed below for reference.    Significant Diagnostic Studies: Dg Wrist Complete Right  12/30/2011  *RADIOLOGY REPORT*  Clinical Data: Right wrist fracture status post reduction.  RIGHT WRIST - COMPLETE 3+ VIEW  Comparison: 12/30/2011.  Findings: Compared to the prior examination there has been interval closed reduction and cast fixation for the distal radial and ulnar fractures.  This cast now obscures fine bony detail.  However, there is significantly improved alignment, which is now near anatomic.  The highly comminuted intra-articular fracture of the distal right radius in particular is markedly improved.  A small ulnar styloid avulsion fracture.  IMPRESSION: 1.  Status post closed  reduction and cast fixation for distal radial and ulnar fractures was significantly improved alignment, as above.   Original Report Authenticated By: Etheleen Mayhew, M.D.    Dg Wrist Complete Right  12/30/2011  *RADIOLOGY REPORT*  Clinical Data:  Fall, laceration  RIGHT WRIST - COMPLETE 3+ VIEW  Comparison: None.  Findings: Four views of the right wrist submitted.  There is displaced impacted comminuted fracture of distal right radius. Probable subtle fracture of tip of the ulnar styloid.  IMPRESSION: Displaced impacted comminuted fracture of distal right radius. Probable subtle fracture of tip of ulnar styloid.   Original Report Authenticated By: Lahoma Crocker, M.D.    Dg Hip Complete Right  12/30/2011  *RADIOLOGY REPORT*  Clinical Data: Fall, laceration  RIGHT HIP - COMPLETE 2+ VIEW  Comparison: None.  Findings: Three views of the right hip submitted.  No acute fracture or subluxation.  There is right hip prosthesis  in anatomic alignment.  IMPRESSION: No acute fracture or subluxation.  Right hip prosthesis  in anatomic alignment.   Original Report Authenticated By: Lahoma Crocker, M.D.    Ct Wrist Right Wo Contrast  12/31/2011  *RADIOLOGY REPORT*  Clinical Data: Further evaluation of a distal right radius fracture  CT OF THE RIGHT WRIST WITHOUT CONTRAST  Technique:  Multidetector CT imaging was performed according to the standard protocol. Multiplanar CT image reconstructions were also generated.  Comparison: 12/30/2011  Findings: There is a comminuted fracture of the distal radius.  The primary fracture line is transverse, across the metaphysis.  There are secondary fracture lines that intersect the articular surface, between the scaphoid and lunate facets and across the center of the lunate facet.  This yields a comminuted fracture fragments involving a portion of the lunate facet that is minimally depressed, by 3 mm.  The fractures also lead to the dorsal angulation of the articular surface of 20 degrees.   Radial inclination is maintained as is overall radial length.  There are too small bony fragments adjacent to the ulnar styloid consistent with small avulsion fractures. This could reflect acute fractures or be chronic.  No other fractures. There are some small cysts and several carpal bones most notably the lunate.  This is most evident along the dorsal ulnar aspect of the lunate.  There is surrounding soft tissue swelling.  IMPRESSION: Comminuted fracture of the distal right radius with dorsal angulation of the articular surface as detailed above.  Small bony fragments adjacent to the ulnar styloid may reflect additional acute fractures or be chronic.   Original Report Authenticated By: [name], M.D.    Ct Hip Right Wo Contrast  12/30/2011  *RADIOLOGY REPORT*  Clinical Data: Right hip pain following a fall.  CT OF THE RIGHT HIP WITHOUT CONTRAST  Technique:  Multidetector CT imaging was performed according to the standard protocol. Multiplanar CT image reconstructions were also generated.  Comparison: 12/30/2011.  Findings: Uncomplicated right total hip arthroplasty is present. Distal femoral stem visualized and appears within normal limits. There is no evidence of hardware loosening or failure. Polyethylene spacer appears within normal limits.  There is a nondisplaced fracture of the right obturator ring. Nondisplaced fracture of the parasymphyseal pubis is present and a nondisplaced fracture of the inferior pubic ramus.  These fractures are radiographically occult on the prior exam.  Atherosclerosis is incidentally noted.  On sagittal imaging, there is buckling of the inferior sacrum, suggesting nondisplaced complimentary sacral fracture although this is only seen on sagittal images.  IMPRESSION: 1.  Uncomplicated right total hip arthroplasty.  No periprosthetic fracture. 2.  Nondisplaced right obturator ring fracture. 3.  Partially visualized buckle fracture of the sacrum at about the S3 level.  This  fracture with complement the right obturator ring fractures.  Original Report Authenticated By: Dereck Ligas, M.D.     Microbiology: No results found for this or any previous visit (from the past 240 hour(s)).   Labs: Basic Metabolic Panel:  Lab [id number] 01/02/12 0445 01/01/12 0526 12/31/11 0403 12/30/11 2306  NA 137 137 137 136 135  K 4.0 4.2 4.8 4.5 4.6  CL 100 104 104 102 102  CO2 29 27 27 28 24   GLUCOSE 97 91 93 120* 116*  BUN 16 17 21 16 18   CREATININE 1.24* 1.31* 1.61* 1.28* 1.22*  CALCIUM 9.1 8.6 8.6 8.6 8.7  MG -- -- -- -- --  PHOS -- -- -- -- --   Liver Function Tests: No results found for this basename: AST:5,ALT:5,ALKPHOS:5,BILITOT:5,PROT:5,ALBUMIN:5 in the last 168 hours No results found for this basename: LIPASE:5,AMYLASE:5 in the last 168 hours No results found for this basename: AMMONIA:5 in the last 168 hours CBC:  Lab 01/02/12 0445 01/01/12 0526 12/31/11 0403 12/30/11 2306  WBC 5.4 4.7 6.7 8.8  NEUTROABS -- -- -- --  HGB 10.4* 10.5* 10.6* 11.1*  HCT 31.6* 32.3* 32.4* 33.1*  MCV 90.8 91.5 90.8 89.7  PLT 244 235 261 283   Cardiac Enzymes: No results found for this basename: CKTOTAL:5,CKMB:5,CKMBINDEX:5,TROPONINI:5 in the last 168 hours BNP: BNP (last 3 results) No results found for this basename: PROBNP:3 in the last 8760 hours CBG:  Lab 12/31/11 0742  GLUCAP 94    Time coordinating discharge: 45 minutes  Signed:  Jveon Pound  [facility] 01/03/2012, 11:06 AM

## 2012-01-03 NOTE — Consult Note (Signed)
Physical Medicine and Rehabilitation Consult Reason for Consult: Right wrist fracture, right pelvic fracture Referring Physician:  Dr. Clementeen Graham.    HPI: Krystal Henderson is a 78 y.o. female with history of HTN, peripheral neuropathy, recent episodes of dizziness, who lost her balance and fell onto her right side-did strike her head but without LOC,while attending a grandson's birthday party at [employer] on [phone].  Patient with complaints of right hip and right wrist pain. She was noted ot have distal right comminuted intraarticular radius fracture. This was close reduced and splinted by ED staff. Patient with inability to walk and was admitted for ambulatory dysfunction. CT pelvis ordered revealing nondisplaced right obturator ring fracture and partially buckle fracture of sacrum at S3. [facility] Ortho consulted for input. Dr. [name] recommended CT wrist wrist for full work up with recommendations for operative fixation in the future.   Is WBAT through right elbow and WBAT on RLE. Pt evaluation done and patient had complaints of dizziness with mobility. Orthostatic changes noted with BP90/57. MD, PT recommending CIR.    Review of Systems  HENT: Negative for hearing loss.   Eyes: Negative for blurred vision and double vision.  Cardiovascular: Negative for chest pain.  Gastrointestinal: Positive for abdominal pain.  Musculoskeletal: Positive for joint pain (pain right hip/right wrist.). Negative for back pain.  Neurological: Positive for weakness. Negative for headaches.   Past Medical History  Diagnosis Date  . HTN (hypertension)   . Hyperlipemia 2006    LDL 130  . Esophageal reflux     inactive  . Anxiety   . Hypoglycemia     reactive  . Peripheral neuropathy   . Vitamin B12 deficiency   . Personal history of colonic polyps     adenomatous  . Diverticulosis of colon (without mention of hemorrhage)   . DJD (degenerative joint disease)   . Osteoporosis   . Angiodysplasia 2007      @ colonoscopy  . Pneumonia     hx of pneumonia   . Anemia     hx of    Past Surgical History  Procedure Date  . Tear duct probing     X 2  . Total hip arthroplasty 2000    right  . Facial cosmetic surgery   . Tubal ligation   . Hemorroidectomy   . Dilation and curettage of uterus   . Colonoscopy w/ polypectomy     X 2 , Dr  Verl Blalock; angiodysplasia. Due 2022  . Lumbar laminectomy/decompression microdiscectomy 09/10/2011    Procedure: LUMBAR LAMINECTOMY/DECOMPRESSION MICRODISCECTOMY;  Surgeon: Johnn Hai, MD;  Location: [facility];  Service: Orthopedics;  Laterality: N/A;  decompression laminectomy L2-3, L3-4, L4-5   Family History  Problem Relation Age of Onset  . Colon cancer Neg Hx   . Diabetes Neg Hx   . Heart disease Father 61    MI  . Kidney disease Mother     renal failure  . Throat cancer Sister     smoker  . Osteoporosis Sister   . Heart attack Brother 1  . Stroke Brother 57    smoker  . Depression Maternal Uncle   . Cancer Brother     X3  lung cancer, all smokers  . Peripheral vascular disease Daughter    Social History:  Married. Independent without AD. She reports that she has never smoked. She has never used smokeless tobacco. She reports that she drinks alcohol. She reports that she does not use illicit drugs.  Husband works part time. He does most of housework.    Allergies  Allergen Reactions  . Codeine     REACTION: nausea  . Duloxetine     REACTION: intolerance  . Sertraline Hcl     REACTION: intolerance   Medications Prior to Admission  Medication Sig Dispense Refill  . ALPRAZolam (XANAX) 0.25 MG tablet Take 0.5-1 mg by mouth at bedtime as needed. Anxiety      . amLODipine (NORVASC) 5 MG tablet Take 5 mg by mouth daily.      . Ascorbic Acid (VITAMIN C) 1000 MG tablet Take 1,000 mg by mouth daily.       [name] aspirin 81 MG tablet Take 81 mg by mouth daily.       . Calcium Carbonate (CALCIUM 600 PO) Take 1 capsule by mouth daily.        . Cholecalciferol (VITAMIN D) 2000 UNITS CAPS Take 1 capsule by mouth daily.       . furosemide (LASIX) 20 MG tablet Take 20 mg by mouth daily.      [name] losartan (COZAAR) 100 MG tablet Take 100 mg by mouth daily.      . meclizine (ANTIVERT) 25 MG tablet Take 12.5 mg by mouth daily.      . Multiple Vitamin (MULTIVITAMIN WITH MINERALS) TABS Take 1 tablet by mouth daily.      . Omega-3 Fatty Acids (FISH OIL) 1000 MG CAPS Take 1 capsule by mouth daily.       . pregabalin (LYRICA) 100 MG capsule Take 100 mg by mouth at bedtime.       . pregabalin (LYRICA) 25 MG capsule Take 25 mg by mouth 2 (two) times daily. One in the morning and one at noon        Home: Home Living Lives With: Spouse Available Help at Discharge: Family Type of Home: House Home Access: Stairs to enter [employer] of Steps: 2 Entrance Stairs-Rails: Can reach both Home Layout: One level Bathroom Shower/Tub: Tub/shower unit;Walk-in shower Bathroom Toilet:  (elevated) Home Adaptive Equipment: Bedside commode/3-in-1;Walker - rolling  Functional History: Prior Function Able to Take Stairs?: Yes Driving: Yes Vocation: Retired Functional Status:  Mobility: Bed Mobility Bed Mobility: Supine to Sit;Sitting - Scoot to Edge of Bed Supine to Sit: 4: Min assist;3: Mod assist;HOB elevated Supine to Sit: Patient Percentage: 60% Sitting - Scoot to Edge of Bed: 3: Mod assist Transfers Transfers: Sit to Stand;Stand to [employer] Transfers Sit to Stand: 4: Min assist;From elevated surface;With upper extremity assist;From bed Sit to Stand: Patient Percentage: 70% Stand to Sit: 4: Min assist;With upper extremity assist;With armrests;To chair/3-in-1 Stand to Sit: Patient Percentage: 70% Stand Pivot Transfers: 1: +2 Total assist Stand Pivot Transfers: Patient Percentage: 60% Ambulation/Gait Ambulation/Gait Assistance: 1: +2 Total assist Ambulation/Gait: Patient Percentage: 60% Ambulation Distance (Feet): 12  Feet Assistive device: Right platform walker Ambulation/Gait Assistance Details: Assist for steadying throughout with cues for sequencing/technique with platform RW, to maintain upright posture and for maintaining WB through elbow only on RUE.  Gait Pattern: Step-to pattern;Decreased stance time - right;Decreased step length - left;Decreased stride length Gait velocity: decreased Stairs: No Wheelchair Mobility Wheelchair Mobility: No  ADL:    Cognition: Cognition Arousal/Alertness: Awake/alert Orientation Level: Oriented X4 Cognition Overall Cognitive Status: Appears within functional limits for tasks assessed/performed Arousal/Alertness: Awake/alert Orientation Level: Appears intact for tasks assessed Behavior During Session: [facility] for tasks performed  Blood pressure 131/71, pulse 68, temperature 98 F (36.7 C), temperature source Oral, resp.  rate 16, height 5\' 6"  (1.676 m), weight 84.4 kg (186 lb 1.1 oz), SpO2 94.00%. Physical Exam  Nursing note and vitals reviewed. Constitutional: She is oriented to person, place, and time. She appears well-developed and well-nourished.  HENT:  Head: Normocephalic.       Resolving ecchymosis right periorbital area with crusted scab laterally.   Eyes: Pupils are equal, round, and reactive to light.  Neck: Normal range of motion.  Cardiovascular: Normal rate and regular rhythm.   Pulmonary/Chest: Effort normal and breath sounds normal.  Abdominal: Soft. Bowel sounds are normal.  Musculoskeletal: She exhibits edema (right hand).       Pain and weakness right hip with attempts at ROM right hip/knee.   Neurological: She is alert and oriented to person, place, and time.  Skin: Skin is warm and dry.    Results for orders placed during the hospital encounter of 12/30/11 (from the past 24 hour(s))  BASIC METABOLIC PANEL     Status: Abnormal   Collection Time   01/03/12  4:08 AM      Component Value Range   Sodium 137  135 - 145 mEq/L   Potassium  4.0  3.5 - 5.1 mEq/L   Chloride 100  96 - 112 mEq/L   CO2 29  19 - 32 mEq/L   Glucose, Bld 97  70 - 99 mg/dL   BUN 16  6 - 23 mg/dL   Creatinine, Ser 1.24 (*) 0.50 - 1.10 mg/dL   Calcium 9.1  8.4 - 10.5 mg/dL   GFR calc non Af Amer 41 (*) >90 mL/min   GFR calc Af Amer 47 (*) >90 mL/min   No results found.  Assessment/Plan: Diagnosis: Fall with multitrauma 1.   RECOMMENDATIONS: This patient's condition is appropriate for continued rehabilitative care in the following setting:  Patient has agreed to participate in recommended program.  Note that insurance prior authorization may be required for reimbursement for recommended care.  Comment:  Patient  accepted a bed at a skilled Nursing facility Planning transfer today. Complete consult not performed for that reason  01/03/2012

## 2012-01-03 NOTE — Progress Notes (Signed)
Clinical Social Work Department BRIEF PSYCHOSOCIAL ASSESSMENT 01/03/2012  Patient:  Krystal Henderson, Krystal Henderson     Account Number:  [id number]     Admit date:  12/30/2011  Clinical Social Worker:  Lacie Scotts  Date/Time:  01/03/2012 02:14 PM  Referred by:  Physician  Date Referred:  01/01/2012 Referred for  SNF Placement   Other Referral:   Interview type:  Patient Other interview type:    PSYCHOSOCIAL DATA Living Status:  HUSBAND Admitted from facility:   Level of care:   Primary support name:   Primary support relationship to patient:  SPOUSE Degree of support available:   supportive    CURRENT CONCERNS Current Concerns  Skilled Nursing placement   Other Concerns:    SOCIAL WORK ASSESSMENT / PLAN Week end CSW met with pt initially to assist with d/c planning. Pt told CSW today that she preferred SNF placement over CIR. Angie offerred pt a ST rehab today which pt accepted. She was d/c to SNF today via [facility] transport.   Assessment/plan status:  No Further Intervention Required Other assessment/ plan:   Information/referral to community resources:   Week end CSW provided SNF list    PATIENT'S/FAMILY'S RESPONSE TO PLAN OF CARE: Pt was looking forward to rehab at [facility].    Werner Lean LCSW [phone]

## 2012-01-07 ENCOUNTER — Other Ambulatory Visit: Payer: Self-pay | Admitting: Orthopedic Surgery

## 2012-01-10 NOTE — Progress Notes (Signed)
Talked with nurse at [facility]-all preop instructions reviewed-to arrive 1130 am

## 2012-01-11 ENCOUNTER — Ambulatory Visit (HOSPITAL_BASED_OUTPATIENT_CLINIC_OR_DEPARTMENT_OTHER): Payer: Medicare Other | Admitting: Anesthesiology

## 2012-01-11 ENCOUNTER — Encounter (HOSPITAL_BASED_OUTPATIENT_CLINIC_OR_DEPARTMENT_OTHER)
Admission: RE | Disposition: A | Discharge status: Home / Self Care | Payer: Self-pay | Source: Ambulatory Visit | Attending: Orthopedic Surgery

## 2012-01-11 ENCOUNTER — Encounter (HOSPITAL_BASED_OUTPATIENT_CLINIC_OR_DEPARTMENT_OTHER): Payer: Self-pay | Admitting: Anesthesiology

## 2012-01-11 ENCOUNTER — Ambulatory Visit (HOSPITAL_BASED_OUTPATIENT_CLINIC_OR_DEPARTMENT_OTHER)
Admission: RE | Admit: 2012-01-11 | Discharge: 2012-01-11 | Disposition: A | Discharge status: Home / Self Care | Payer: Medicare Other | Source: Ambulatory Visit | Attending: Orthopedic Surgery | Admitting: Orthopedic Surgery

## 2012-01-11 ENCOUNTER — Encounter (HOSPITAL_BASED_OUTPATIENT_CLINIC_OR_DEPARTMENT_OTHER): Payer: Self-pay | Admitting: Orthopedic Surgery

## 2012-01-11 DIAGNOSIS — I10 Essential (primary) hypertension: Secondary | ICD-10-CM | POA: Insufficient documentation

## 2012-01-11 DIAGNOSIS — W19XXXA Unspecified fall, initial encounter: Secondary | ICD-10-CM | POA: Insufficient documentation

## 2012-01-11 DIAGNOSIS — S52599A Other fractures of lower end of unspecified radius, initial encounter for closed fracture: Secondary | ICD-10-CM | POA: Insufficient documentation

## 2012-01-11 HISTORY — PX: ORIF WRIST FRACTURE: SHX2133

## 2012-01-11 LAB — POCT I-STAT, CHEM 8
BUN: 17 mg/dL (ref 6–23)
Calcium, Ion: 1.17 mmol/L (ref 1.13–1.30)
Hemoglobin: 11.9 g/dL — ABNORMAL LOW (ref 12.0–15.0)
Sodium: 141 mEq/L (ref 135–145)
TCO2: 25 mmol/L (ref 0–100)

## 2012-01-11 SURGERY — OPEN REDUCTION INTERNAL FIXATION (ORIF) WRIST FRACTURE
Anesthesia: General | Site: Wrist | Laterality: Right | Wound class: Clean

## 2012-01-11 MED ORDER — ACETAMINOPHEN 10 MG/ML IV SOLN
1000.0000 mg | Freq: Once | INTRAVENOUS | Status: AC
  Administered 2012-01-11: 1000 mg via INTRAVENOUS

## 2012-01-11 MED ORDER — ONDANSETRON HCL 4 MG/2ML IJ SOLN
INTRAMUSCULAR | Status: DC | PRN
  Administered 2012-01-11: 4 mg via INTRAVENOUS

## 2012-01-11 MED ORDER — CEFAZOLIN SODIUM-DEXTROSE 2-3 GM-% IV SOLR
2.0000 g | Freq: Once | INTRAVENOUS | Status: AC
  Administered 2012-01-11: 2 g via INTRAVENOUS

## 2012-01-11 MED ORDER — CHLORHEXIDINE GLUCONATE 4 % EX LIQD: 60.0000 mL | Freq: Once | CUTANEOUS | Status: DC

## 2012-01-11 MED ORDER — LACTATED RINGERS IV SOLN
INTRAVENOUS | Status: DC
  Administered 2012-01-11 (×2): via INTRAVENOUS

## 2012-01-11 MED ORDER — FENTANYL CITRATE 0.05 MG/ML IJ SOLN
INTRAMUSCULAR | Status: DC | PRN
  Administered 2012-01-11: 50 ug via INTRAVENOUS

## 2012-01-11 MED ORDER — DEXAMETHASONE SODIUM PHOSPHATE 4 MG/ML IJ SOLN
INTRAMUSCULAR | Status: DC | PRN
  Administered 2012-01-11: 10 mg via INTRAVENOUS

## 2012-01-11 MED ORDER — BUPIVACAINE HCL (PF) 0.25 % IJ SOLN
INTRAMUSCULAR | Status: DC | PRN
  Administered 2012-01-11: 10 mL

## 2012-01-11 MED ORDER — BUPIVACAINE-EPINEPHRINE PF 0.5-1:200000 % IJ SOLN
INTRAMUSCULAR | Status: DC | PRN
  Administered 2012-01-11: 30 mL

## 2012-01-11 MED ORDER — MIDAZOLAM HCL 5 MG/5ML IJ SOLN
INTRAMUSCULAR | Status: DC | PRN
  Administered 2012-01-11: 1 mg via INTRAVENOUS

## 2012-01-11 MED ORDER — FENTANYL CITRATE 0.05 MG/ML IJ SOLN
50.0000 ug | INTRAMUSCULAR | Status: DC | PRN
  Administered 2012-01-11: 100 ug via INTRAVENOUS

## 2012-01-11 MED ORDER — MIDAZOLAM HCL 2 MG/2ML IJ SOLN: 0.5000 mg | INTRAMUSCULAR | Status: DC | PRN

## 2012-01-11 MED ORDER — PROPOFOL 10 MG/ML IV BOLUS
INTRAVENOUS | Status: DC | PRN
  Administered 2012-01-11: 150 mg via INTRAVENOUS

## 2012-01-11 MED ORDER — OXYCODONE-ACETAMINOPHEN 5-325 MG PO TABS: ORAL_TABLET | ORAL | Status: DC

## 2012-01-11 MED ORDER — EPHEDRINE SULFATE 50 MG/ML IJ SOLN
INTRAMUSCULAR | Status: DC | PRN
  Administered 2012-01-11: 10 mg via INTRAVENOUS

## 2012-01-11 SURGICAL SUPPLY — 78 items
BANDAGE ELASTIC 3 VELCRO ST LF (GAUZE/BANDAGES/DRESSINGS) ×2 IMPLANT
BANDAGE GAUZE ELAST BULKY 4 IN (GAUZE/BANDAGES/DRESSINGS) ×2 IMPLANT
BIT DRILL 2.0 LNG QUCK RELEASE (BIT) IMPLANT
BIT DRILL 2.8X5 QR DISP (BIT) ×1 IMPLANT
BLADE MINI RND TIP GREEN BEAV (BLADE) IMPLANT
BLADE SURG 15 STRL LF DISP TIS (BLADE) ×2 IMPLANT
BLADE SURG 15 STRL SS (BLADE) ×4
BNDG CMPR 9X4 STRL LF SNTH (GAUZE/BANDAGES/DRESSINGS) ×1
BNDG ESMARK 4X9 LF (GAUZE/BANDAGES/DRESSINGS) ×2 IMPLANT
BONE CHIP PRESERV 5CC PCAN5 (Bone Implant) ×2 IMPLANT
CHLORAPREP W/TINT 26ML (MISCELLANEOUS) ×2 IMPLANT
CLOTH BEACON ORANGE TIMEOUT ST (SAFETY) ×2 IMPLANT
CORDS BIPOLAR (ELECTRODE) ×2 IMPLANT
COVER MAYO STAND STRL (DRAPES) ×2 IMPLANT
COVER TABLE BACK 60X90 (DRAPES) ×2 IMPLANT
DRAPE EXTREMITY T 121X128X90 (DRAPE) ×2 IMPLANT
DRAPE OEC MINIVIEW 54X84 (DRAPES) ×2 IMPLANT
DRAPE SURG 17X23 STRL (DRAPES) ×2 IMPLANT
DRILL 2.0 LNG QUICK RELEASE (BIT) ×2
GAUZE XEROFORM 1X8 LF (GAUZE/BANDAGES/DRESSINGS) ×2 IMPLANT
GLOVE BIO SURGEON STRL SZ 6.5 (GLOVE) ×1 IMPLANT
GLOVE BIO SURGEON STRL SZ7.5 (GLOVE) ×3 IMPLANT
GLOVE BIOGEL PI IND STRL 7.0 (GLOVE) IMPLANT
GLOVE BIOGEL PI IND STRL 8 (GLOVE) ×1 IMPLANT
GLOVE BIOGEL PI IND STRL 8.5 (GLOVE) IMPLANT
GLOVE BIOGEL PI INDICATOR 7.0 (GLOVE) ×1
GLOVE BIOGEL PI INDICATOR 8 (GLOVE) ×2
GLOVE BIOGEL PI INDICATOR 8.5 (GLOVE) ×1
GLOVE SURG ORTHO 8.0 STRL STRW (GLOVE) ×1 IMPLANT
GOWN PREVENTION PLUS XLARGE (GOWN DISPOSABLE) ×2 IMPLANT
GOWN STRL REIN XL XLG (GOWN DISPOSABLE) ×4 IMPLANT
GRAFT BNE CANC CHIPS 1-8 5CC (Bone Implant) IMPLANT
GUIDEWIRE ORTHO 0.054X6 (WIRE) ×3 IMPLANT
GUIDEWIRE ORTHO MICROSHT  ACUT (WIRE) ×3
GUIDEWIRE ORTHO MICROSHT .035 (WIRE) IMPLANT
NDL HYPO 25X1 1.5 SAFETY (NEEDLE) IMPLANT
NEEDLE HYPO 22GX1.5 SAFETY (NEEDLE) IMPLANT
NEEDLE HYPO 25X1 1.5 SAFETY (NEEDLE) ×2 IMPLANT
NS IRRIG 1000ML POUR BTL (IV SOLUTION) ×2 IMPLANT
PACK BASIN DAY SURGERY FS (CUSTOM PROCEDURE TRAY) ×2 IMPLANT
PAD CAST 3X4 CTTN HI CHSV (CAST SUPPLIES) ×1 IMPLANT
PAD CAST 4YDX4 CTTN HI CHSV (CAST SUPPLIES) IMPLANT
PADDING CAST ABS 4INX4YD NS (CAST SUPPLIES)
PADDING CAST ABS COTTON 4X4 ST (CAST SUPPLIES) ×1 IMPLANT
PADDING CAST COTTON 3X4 STRL (CAST SUPPLIES) ×2
PADDING CAST COTTON 4X4 STRL (CAST SUPPLIES) ×2
PLATE STD RT ACULOC 2 (Plate) ×1 IMPLANT
SCREW 3.5MMX12.0MM (Screw) ×1 IMPLANT
SCREW ACTK 2 NL HEX 3.5.11 (Screw) ×1 IMPLANT
SCREW CORT 24X2.3XNONTOGGLE (Screw) IMPLANT
SCREW CORT 3.5X14 (Screw) ×1 IMPLANT
SCREW CORT FT 20X2.3XLCK HEX (Screw) IMPLANT
SCREW CORT FT 22X2.3XLCK HEX (Screw) IMPLANT
SCREW CORTICAL 2.3X24 (Screw) ×2 IMPLANT
SCREW CORTICAL LOCKING 2.3X20M (Screw) ×10 IMPLANT
SCREW CORTICAL LOCKING 2.3X22M (Screw) ×4 IMPLANT
SCREW FX20X2.3XSMTH LCK NS CRT (Screw) IMPLANT
SCREW FX22X2.3XLCK SMTH NS CRT (Screw) IMPLANT
SCREW NON TOGG 2.3X22MM (Screw) ×1 IMPLANT
SCREW NON TOGGLE 2.3X26 (Screw) ×1 IMPLANT
SLEEVE SCD COMPRESS KNEE MED (MISCELLANEOUS) ×2 IMPLANT
SPLINT PLASTER CAST XFAST 3X15 (CAST SUPPLIES) ×10 IMPLANT
SPLINT PLASTER XTRA FASTSET 3X (CAST SUPPLIES) ×1
SPONGE GAUZE 4X4 12PLY (GAUZE/BANDAGES/DRESSINGS) ×3 IMPLANT
STOCKINETTE 4X48 STRL (DRAPES) ×2 IMPLANT
SUCTION FRAZIER TIP 10 FR DISP (SUCTIONS) IMPLANT
SUT ETHILON 3 0 PS 1 (SUTURE) IMPLANT
SUT ETHILON 4 0 PS 2 18 (SUTURE) ×4 IMPLANT
SUT VIC AB 3-0 PS1 18 (SUTURE)
SUT VIC AB 3-0 PS1 18XBRD (SUTURE) IMPLANT
SUT VICRYL 4-0 PS2 18IN ABS (SUTURE) ×2 IMPLANT
SYR BULB 3OZ (MISCELLANEOUS) ×2 IMPLANT
SYR CONTROL 10ML LL (SYRINGE) IMPLANT
TOWEL OR 17X24 6PK STRL BLUE (TOWEL DISPOSABLE) ×4 IMPLANT
TOWEL OR 17X26 10 PK STRL BLUE (TOWEL DISPOSABLE) ×2 IMPLANT
TUBE CONNECTING 20X1/4 (TUBING) IMPLANT
UNDERPAD 30X30 INCONTINENT (UNDERPADS AND DIAPERS) ×2 IMPLANT
WATER STERILE IRR 1000ML POUR (IV SOLUTION) IMPLANT

## 2012-01-11 NOTE — Anesthesia Procedure Notes (Addendum)
Anesthesia Regional Block:  Supraclavicular block  Pre-Anesthetic Checklist: ,, timeout performed, Correct Patient, Correct Site, Correct Laterality, Correct Procedure, Correct Position, site marked, Risks and benefits discussed, pre-op evaluation, post-op pain management  Laterality: Right  Prep: Maximum Sterile Barrier Precautions used and chloraprep       Needles:  Injection technique: Single-shot  Needle Type: Echogenic Stimulator Needle      Needle Gauge: 22 and 22 G    Additional Needles:  Procedures: ultrasound guided and nerve stimulator Supraclavicular block Narrative:  Start time: 01/11/2012 12:28 PM End time: 01/11/2012 12:40 PM Injection made incrementally with aspirations every 5 mL. Anesthesiologist: Fitzgerald,MD  Additional Notes: 2% Lidocaine skin wheel. Intercostobrachial block with 5cc of 0.25% Bupivicaine plain.  Supraclavicular block Procedure Name: LMA Insertion Date/Time: 01/11/2012 2:39 PM Performed by: Melynda Ripple D Pre-anesthesia Checklist: Patient identified, Emergency Drugs available, Suction available and Patient being monitored Patient Re-evaluated:Patient Re-evaluated prior to inductionOxygen Delivery Method: Circle System Utilized Preoxygenation: Pre-oxygenation with 100% oxygen Intubation Type: IV induction Ventilation: Mask ventilation without difficulty LMA: LMA inserted LMA Size: 4.0 Number of attempts: 1 Airway Equipment and Method: bite block Placement Confirmation: positive ETCO2 and breath sounds checked- equal and bilateral Tube secured with: Tape Dental Injury: Teeth and Oropharynx as per pre-operative assessment

## 2012-01-11 NOTE — Anesthesia Preprocedure Evaluation (Signed)
Anesthesia Evaluation  Patient identified by MRN, date of birth, ID band Patient awake    Reviewed: Allergy & Precautions, H&P , NPO status , Patient's Chart, lab work & pertinent test results  Airway Mallampati: II TM Distance: >3 FB Neck ROM: Full    Dental No notable dental hx. (+) Upper Dentures, Lower Dentures and Dental Advisory Given   Pulmonary neg pulmonary ROS,  breath sounds clear to auscultation  Pulmonary exam normal       Cardiovascular hypertension, On Medications Rhythm:Regular Rate:Normal     Neuro/Psych negative neurological ROS  negative psych ROS   GI/Hepatic Neg liver ROS, GERD-  ,  Endo/Other  negative endocrine ROS  Renal/GU Renal disease  negative genitourinary   Musculoskeletal   Abdominal   Peds  Hematology negative hematology ROS (+)   Anesthesia Other Findings   Reproductive/Obstetrics negative OB ROS                           Anesthesia Physical Anesthesia Plan  ASA: II  Anesthesia Plan: General and Regional   Post-op Pain Management:    Induction: Intravenous  Airway Management Planned: LMA  Additional Equipment:   Intra-op Plan:   Post-operative Plan: Extubation in OR  Informed Consent: I have reviewed the patients History and Physical, chart, labs and discussed the procedure including the risks, benefits and alternatives for the proposed anesthesia with the patient or authorized representative who has indicated his/her understanding and acceptance.   Dental advisory given  Plan Discussed with: CRNA  Anesthesia Plan Comments:         Anesthesia Quick Evaluation

## 2012-01-11 NOTE — Op Note (Signed)
Dictation [phone]

## 2012-01-11 NOTE — Anesthesia Postprocedure Evaluation (Signed)
Anesthesia Post Note  Patient: Krystal Henderson  Procedure(s) Performed: Procedure(s) (LRB): OPEN REDUCTION INTERNAL FIXATION (ORIF) WRIST FRACTURE (Right)  Anesthesia type: General  Patient location: PACU  Post pain: Pain level controlled and Adequate analgesia  Post assessment: Post-op Vital signs reviewed, Patient's Cardiovascular Status Stable, Respiratory Function Stable, Patent Airway and Pain level controlled  Last Vitals:  Filed Vitals:   01/11/12 1730  BP: 128/53  Pulse: 85  Temp:   Resp: 15    Post vital signs: Reviewed and stable  Level of consciousness: awake, alert  and oriented  Complications: No apparent anesthesia complications

## 2012-01-11 NOTE — H&P (Signed)
Krystal Henderson is an 78 y.o. female.   Chief Complaint: right wrist fracture and ganglion HPI: 78 yo female fell ~10 days ago and injured right wrist.  Closed reduction in ed by ed staff.  At rehab facility for ambulatory dysfunction.  sugartong splint to right wrist.  Past Medical History  Diagnosis Date  . HTN (hypertension)   . Hyperlipemia 2006    LDL 130  . Esophageal reflux     inactive  . Anxiety   . Hypoglycemia     reactive  . Peripheral neuropathy   . Vitamin B12 deficiency   . Personal history of colonic polyps     adenomatous  . Diverticulosis of colon (without mention of hemorrhage)   . DJD (degenerative joint disease)   . Osteoporosis   . Angiodysplasia 2007    @ colonoscopy  . Pneumonia     hx of pneumonia   . Anemia     hx of     Past Surgical History  Procedure Date  . Tear duct probing     X 2  . Total hip arthroplasty 2000    right  . Facial cosmetic surgery   . Tubal ligation   . Hemorroidectomy   . Dilation and curettage of uterus   . Colonoscopy w/ polypectomy     X 2 , Dr  Verl Blalock; angiodysplasia. Due 2022  . Lumbar laminectomy/decompression microdiscectomy 09/10/2011    Procedure: LUMBAR LAMINECTOMY/DECOMPRESSION MICRODISCECTOMY;  Surgeon: Johnn Hai, MD;  Location: [facility];  Service: Orthopedics;  Laterality: N/A;  decompression laminectomy L2-3, L3-4, L4-5    Family History  Problem Relation Age of Onset  . Colon cancer Neg Hx   . Diabetes Neg Hx   . Heart disease Father 54    MI  . Kidney disease Mother     renal failure  . Throat cancer Sister     smoker  . Osteoporosis Sister   . Heart attack Brother 69  . Stroke Brother 38    smoker  . Depression Maternal Uncle   . Cancer Brother     X3  lung cancer, all smokers  . Peripheral vascular disease Daughter    Social History:  reports that she has never smoked. She has never used smokeless tobacco. She reports that she drinks alcohol. She reports that she does not  use illicit drugs.  Allergies:  Allergies  Allergen Reactions  . Codeine     REACTION: nausea  . Duloxetine     REACTION: intolerance  . Sertraline Hcl     REACTION: intolerance    Medications Prior to Admission  Medication Sig Dispense Refill  . ALPRAZolam (XANAX) 0.25 MG tablet Take 0.5-1 mg by mouth at bedtime as needed. Anxiety      . amLODipine (NORVASC) 5 MG tablet Take 5 mg by mouth daily.      . Ascorbic Acid (VITAMIN C) 1000 MG tablet Take 1,000 mg by mouth daily.       [name] aspirin 81 MG tablet Take 81 mg by mouth daily.       . Calcium Carbonate (CALCIUM 600 PO) Take 1 capsule by mouth daily.       . Cholecalciferol (VITAMIN D) 2000 UNITS CAPS Take 1 capsule by mouth daily.       . furosemide (LASIX) 20 MG tablet Take 1 tablet (20 mg total) by mouth 3 (three) times a week.  30 tablet  0  . HYDROcodone-acetaminophen (NORCO/VICODIN) 5-325 MG per tablet  Take 2 tablets by mouth every 6 (six) hours as needed.  30 tablet  0  . meclizine (ANTIVERT) 25 MG tablet Take 12.5 mg by mouth daily.      . Multiple Vitamin (MULTIVITAMIN WITH MINERALS) TABS Take 1 tablet by mouth daily.      . Omega-3 Fatty Acids (FISH OIL) 1000 MG CAPS Take 1 capsule by mouth daily.       . polyethylene glycol (MIRALAX / GLYCOLAX) packet Take 17 g by mouth daily.  10 each  0  . pregabalin (LYRICA) 100 MG capsule Take 100 mg by mouth at bedtime.       . pregabalin (LYRICA) 25 MG capsule Take 25 mg by mouth 2 (two) times daily. One in the morning and one at noon        No results found for this or any previous visit (from the past 48 hour(s)).  No results found.   A comprehensive review of systems was negative except for: Eyes: positive for contacts/glasses Ears, nose, mouth, throat, and face: positive for hoarseness Neurological: positive for gait problems  There were no vitals taken for this visit.  General appearance: alert, cooperative and appears stated age Head: Normocephalic, without obvious  abnormality, atraumatic Neck: supple, symmetrical, trachea midline Resp: clear to auscultation bilaterally Cardio: regular rate and rhythm GI: non tender Extremities: light touch sensation and capillary refill intact all digits.  +epl/fpl/io. Pulses: 2+ and symmetric Skin: Skin color, texture, turgor normal. No rashes or lesions Neurologic: Grossly normal Incision/Wound: na  Assessment/Plan Right distal radius fracture and ganglion cyst.  Discussed non operative and operative treatment options.  She wishes to proceed with operative fixation and excision of cyst.  Risks, benefits, and alternatives of surgery were discussed and the patient agrees with the plan of care.   Krystal Henderson R 01/11/2012, 11:56 AM

## 2012-01-11 NOTE — Transfer of Care (Signed)
Immediate Anesthesia Transfer of Care Note  Patient: Krystal Henderson  Procedure(s) Performed: Procedure(s) (LRB) with comments: OPEN REDUCTION INTERNAL FIXATION (ORIF) WRIST FRACTURE (Right)  Patient Location: PACU  Anesthesia Type: General  Level of Consciousness: awake and alert   Airway & Oxygen Therapy: Patient Spontanous Breathing and Patient connected to face mask oxygen  Post-op Assessment: Report given to PACU RN and Post -op Vital signs reviewed and stable  Post vital signs: Reviewed and stable  Complications: No apparent anesthesia complications

## 2012-01-11 NOTE — Progress Notes (Signed)
Assisted Dr. Fitzgerald with right, ultrasound guided, supraclavicular block. Side rails up, monitors on throughout procedure. See vital signs in flow sheet. Tolerated Procedure well. 

## 2012-01-12 ENCOUNTER — Encounter (HOSPITAL_BASED_OUTPATIENT_CLINIC_OR_DEPARTMENT_OTHER): Payer: Self-pay | Admitting: Orthopedic Surgery

## 2012-01-12 NOTE — Op Note (Signed)
Krystal Henderson, ALATORRE NO.:  [id number]  MEDICAL RECORD NO.:  [id number]  LOCATION:                               FACILITY:  [facility]  PHYSICIAN:  Leanora Cover, MD        DATE OF BIRTH:  1932/12/17  DATE OF PROCEDURE:  01/11/2012 DATE OF DISCHARGE:  01/11/2012                              OPERATIVE REPORT   PREOPERATIVE DIAGNOSIS:  Right comminuted intra-articular distal radius fracture.  POSTOPERATIVE DIAGNOSIS:  Right comminuted intra-articular distal radius fracture.  PROCEDURE:  Open reduction and internal fixation of right comminuted intra-articular distal radius fracture.  SURGEON:  Leanora Cover, MD  ASSISTANT:  Daryll Brod, MD  ANESTHESIA:  General with regional.  IV FLUIDS:  Per anesthesia flow sheet.  ESTIMATED BLOOD LOSS:  Minimal.  COMPLICATIONS:  None.  SPECIMENS:  None.  TOURNIQUET TIME:  85 minutes.  DISPOSITION:  Stable to PACU.  INDICATIONS:  Krystal Henderson is a 78 year old female who fell approximately 10 days ago injuring her right wrist.  She has been admitted to a [facility] for ambulatory dysfunction.  I discussed with Ms. Kading the nature of her injury.  We discussed nonoperative and operative treatment options.  She wished to proceed with open reduction and internal fixation for management of the fracture.  Risks, benefits and alternatives of the surgery were discussed including the risk of blood loss; infection; damage to nerves, vessels, tendons, ligaments, bone; failure of surgery; need for additional surgery; complications with wound healing; continued pain; nonunion; malunion; stiffness.  She voiced understanding of these risks and elected to proceed.  OPERATIVE COURSE:  After being identified preoperatively by myself, the patient and I agreed upon the procedure and site of procedure.  The surgical site was marked.  The risks, benefits, and alternatives of the surgery were reviewed and she wished to proceed.  Surgical  consent had been signed.  She was given 2 g of IV Ancef as a preoperative antibiotic prophylaxis.  She has transferred to the operating room and placed in the operating room table in supine position with the right upper extremity on an armboard.  General anesthesia was induced by the anesthesiologist.  The right upper extremity was prepped and draped in normal sterile orthopedic fashion.  A surgical pause was performed between the surgeons, anesthesia, and operating room staff, and all were in agreement as to the patient, procedure, and site of procedure. Tourniquet at the proximal aspect of the extremity was inflated to 250 mmHg after exsanguination of the limb with an Esmarch bandage.  Standard volar Mallie Mussel approach was used.  The superficial and deep portions of the FCR tendon sheath were incised, and the FCR and FPL were swept ulnarly to protect the palmar cutaneous branch of the median nerve. Brachioradialis was released from the radial side of the radius.  The pronator quadratus was released from the radial side of the radius and elevated with periosteal elevator.  The fracture site was easily identified.  It did enter the radiocarpal joint.  There was a lunate facet fragment that coursed towards the diaphysis.  The styloid facet fragment had been impacted down.  Direct visualization was used to obtain reduction.  C-arm  was used in AP and lateral projections to ensure appropriate reduction.  It was felt that bone graft would be necessary.  Cancellous bone chips were used to pack the fracture site on the radial side.  A 0.035-inch K-wires were then used to provisionally stabilize the fracture.  A standard plate from the Acumed volar distal radial locking set was selected and secured to the bone using the guidepins.  A nonlocking screw was placed in the slotted hole in the shaft of the plate.  The ulnar most distal hole was filled with a nonlocking screw in standard AO drilling and  measuring technique.  The remaining holes in the distal aspect of the plate were filled with smooth pegs with the exception of the styloid holes, which were filled with locking screws.  A lag screw was placed from the radial side of the distal radius across to capture the ulnar-sided fragment.  The nonlocking screw was then changed for a locking screw.  The remaining holes in the shaft of the plate were filled again using standard AO drilling and measuring technique.  C-arm was used in AP, lateral, and oblique projections to ensure appropriate reduction and position of hardware for the case.  There was no intra-articular penetration.  The forearm had full pronation and supination.  The distal radioulnar joint was stable.  The wound was copiously irrigated with sterile saline.  The pronator quadratus was repaired back over top of the plate using 4-0 Vicryl suture.  A single interrupted Vicryl suture was placed in subcutaneous tissues and the skin was closed with 4-0 nylon in a horizontal mattress fashion.  The wound was dressed with sterile Xeroform, 4x4s, and wrapped with Kerlix bandage.  A volar splint was placed and wrapped with Kerlix and Ace bandage.  The operative drapes were broken down.  The tourniquet was deflated at 85 minutes.  The patient was awakened from anesthesia safely.  She was transferred back to the stretcher and taken to PACU in stable condition.  I will see her back in the office in 1 week for postoperative followup.  I will give her Percocet 5/325 1-2 p.o. q.6 hours p.r.n. pain, dispensed #40.     Leanora Cover, MD     KK/MEDQ  D:  01/11/2012  T:  01/12/2012  Job:  [id number]

## 2012-02-24 ENCOUNTER — Other Ambulatory Visit: Payer: Self-pay | Admitting: Internal Medicine

## 2012-02-24 NOTE — Telephone Encounter (Signed)
Rx sent.    MW 

## 2012-03-15 ENCOUNTER — Ambulatory Visit (INDEPENDENT_AMBULATORY_CARE_PROVIDER_SITE_OTHER): Payer: Medicare Other | Admitting: Internal Medicine

## 2012-03-15 ENCOUNTER — Encounter: Payer: Self-pay | Admitting: Internal Medicine

## 2012-03-15 ENCOUNTER — Other Ambulatory Visit: Payer: Self-pay | Admitting: Internal Medicine

## 2012-03-15 VITALS — BP 112/70 | HR 76 | Temp 97.7°F | Resp 12 | Ht 65.03 in | Wt 169.0 lb

## 2012-03-15 DIAGNOSIS — E782 Mixed hyperlipidemia: Secondary | ICD-10-CM

## 2012-03-15 DIAGNOSIS — Z86010 Personal history of colonic polyps: Secondary | ICD-10-CM

## 2012-03-15 DIAGNOSIS — Z0000 Encounter for general adult medical examination without abnormal findings: Secondary | ICD-10-CM

## 2012-03-15 DIAGNOSIS — E538 Deficiency of other specified B group vitamins: Secondary | ICD-10-CM

## 2012-03-15 DIAGNOSIS — N289 Disorder of kidney and ureter, unspecified: Secondary | ICD-10-CM

## 2012-03-15 DIAGNOSIS — K219 Gastro-esophageal reflux disease without esophagitis: Secondary | ICD-10-CM

## 2012-03-15 DIAGNOSIS — I10 Essential (primary) hypertension: Secondary | ICD-10-CM

## 2012-03-15 DIAGNOSIS — R32 Unspecified urinary incontinence: Secondary | ICD-10-CM

## 2012-03-15 LAB — LIPID PANEL
HDL: 69.4 mg/dL (ref >39.00)
Triglycerides: 77 mg/dL (ref 0.0–149.0)
VLDL: 15.4 mg/dL (ref 0.0–40.0)

## 2012-03-15 LAB — BASIC METABOLIC PANEL
BUN: 23 mg/dL (ref 6–23)
Calcium: 9.4 mg/dL (ref 8.4–10.5)
GFR: 34.14 mL/min — ABNORMAL LOW (ref >60.00)
Glucose, Bld: 91 mg/dL (ref 70–99)
Potassium: 4.6 mEq/L (ref 3.5–5.1)
Sodium: 141 mEq/L (ref 135–145)

## 2012-03-15 LAB — CBC WITH DIFFERENTIAL/PLATELET
Basophils Absolute: 0 10*3/uL (ref 0.0–0.1)
Eosinophils Relative: 1 % (ref 0.0–5.0)
HCT: 35.3 % — ABNORMAL LOW (ref 36.0–46.0)
Lymphocytes Relative: 20.3 % (ref 12.0–46.0)
Monocytes Relative: 7.7 % (ref 3.0–12.0)
Platelets: 441 10*3/uL — ABNORMAL HIGH (ref 150.0–400.0)
RDW: 15.3 % — ABNORMAL HIGH (ref 11.5–14.6)
WBC: 6.9 10*3/uL (ref 4.5–10.5)

## 2012-03-15 LAB — HEPATIC FUNCTION PANEL
Albumin: 3.7 g/dL (ref 3.5–5.2)
Alkaline Phosphatase: 152 U/L — ABNORMAL HIGH (ref 39–117)
Total Protein: 7.7 g/dL (ref 6.0–8.3)

## 2012-03-15 LAB — POCT URINALYSIS DIPSTICK
Bilirubin, UA: NEGATIVE
Blood, UA: NEGATIVE
Glucose, UA: NEGATIVE
Ketones, UA: NEGATIVE
Spec Grav, UA: 1.01
Urobilinogen, UA: 0.2

## 2012-03-15 LAB — TSH: TSH: 1.28 u[IU]/mL (ref 0.35–5.50)

## 2012-03-15 NOTE — Progress Notes (Signed)
Subjective:    Patient ID: Krystal Henderson, female    DOB: 07-05-1932, 78 y.o.   MRN: [id number]  HPI Medicare Wellness Visit:  The following psychosocial & medical history were reviewed as required by Medicare.   Social history: caffeine: none , alcohol:  rarely ,  tobacco use : never  & exercise :no due to Fractures.   Home & personal  safety / fall risk: using cane , activities of daily living: no limitations , seatbelt use : yes , and smoke alarm employment : yes .  Power of Attorney/Living Will status : in place Vision ( as recorded per Nurse) & Hearing  evaluation : Ophth exam this year. No hearing exam. Orientation :oriented X 3 , memory & recall :good,  math testing: good,and mood & affect : normal . Depression / anxiety: denied Travel history : 2008 [place] , immunization status :Shingles needed , transfusion history:  no, and preventive health surveillance ( colonoscopies, BMD , etc as per protocol/ [facility]): colonoscopy 2012, Dental care:  every 12 mos . Chart reviewed &  Updated. Active issues reviewed & addressed.       Review of Systems She fell climbing steps with her hands full 12/30/11. She sustained a wrist fracture, pelvic fracture, facial lacerations, and probable renal contusion. No surgery was required.  She denies any cardiac or neuro prodrome prior to the fall.  She has occasional incontinence both @ night & during the day.     Objective:   Physical Exam Gen.: Appears younger than stated age .Well-nourished in appearance. Alert, appropriate and cooperative throughout exam. Head: Normocephalic without obvious abnormalities  Eyes: No corneal or conjunctival inflammation noted. Pupils equal round reactive to light and accommodation. Fundal exam is benign without hemorrhages, exudate, papilledema. Extraocular motion intact. Vision grossly decreased OS even with lenses Ears: External  ear exam reveals no significant lesions or deformities. Wax bilaterally. Hearing is  grossly normal bilaterally. Nose: External nasal exam reveals no deformity or inflammation. Nasal mucosa are pink and moist. No lesions or exudates noted.   Mouth: Oral mucosa and oropharynx reveal no lesions or exudates. Dentures. Neck: No deformities, masses, or tenderness noted. Range of motion decreased. Thyroid normal. Lungs: Normal respiratory effort; chest expands symmetrically. Lungs are clear to auscultation without rales, wheezes, or increased work of breathing. Heart: Normal rate and rhythm. Normal S1 and S2. No gallop, click, or rub. Grade 1/2 over 6 systolic murmur . Abdomen: Bowel sounds normal; abdomen soft and nontender. No masses, organomegaly or hernias noted. Genitalia: Dr Ubaldo Glassing, Gyn Musculoskeletal/extremities: No deformity or scoliosis noted of  the thoracic or lumbar spine. No clubbing, cyanosis, edema, or deformity noted. Range of motion of extremities not tested due to fractures .Tone & strength  normal.Joints normal. Nail health  good. Vascular: Carotid, radial artery, dorsalis pedis and  posterior tibial pulses are full and equal. No bruits present. Neurologic: Alert and oriented x3. Deep tendon reflexes symmetrical and normal.          Skin: Intact without suspicious lesions or rashes. Lymph: No cervical, axillary lymphadenopathy present. Psych: Mood and affect are normal. Normally interactive  Assessment & Plan:  #1 Medicare Wellness Exam; criteria met ; data entered #2 Problem List reviewed ; Assessment/ Recommendations made Plan: see Orders

## 2012-03-15 NOTE — Patient Instructions (Addendum)
If you activate My Chart; the results can be released to you as soon as they populate from the lab. If you choose not to use this program; the labs have to be reviewed, copied & mailed   causing a delay in getting the results to you.  Review and correct the record as indicated. Please share record with all medical staff seen.  If urinary symptoms  or progress , I recommend you see Dr Nicki Reaper McDiarmid

## 2012-03-27 ENCOUNTER — Other Ambulatory Visit: Payer: Self-pay | Admitting: Internal Medicine

## 2012-04-18 ENCOUNTER — Telehealth: Payer: Self-pay | Admitting: Internal Medicine

## 2012-04-18 NOTE — Telephone Encounter (Signed)
Forwarding to MD Juluis Rainier)

## 2012-04-18 NOTE — Telephone Encounter (Signed)
[place] Fax: [phone] From: [facility] Date/ Time: 04/17/2012 5:13 PM Taken By: Biagio Quint, CSR [name]: [facility]: home Patient: Krystal, Henderson DOB: [birth year] Phone: [phone] Reason for Call: Patient is calling to let Dr. Linna Darner know that they had to do the MRI without contrast. They were unable to use dye sue to kidney impairment.

## 2012-05-11 ENCOUNTER — Encounter: Payer: Self-pay | Admitting: Internal Medicine

## 2012-05-15 ENCOUNTER — Encounter: Payer: Self-pay | Admitting: Internal Medicine

## 2012-05-23 ENCOUNTER — Encounter: Payer: Self-pay | Admitting: Internal Medicine

## 2012-06-16 ENCOUNTER — Other Ambulatory Visit (HOSPITAL_COMMUNITY): Payer: Self-pay | Admitting: Specialist

## 2012-06-21 ENCOUNTER — Other Ambulatory Visit: Payer: Self-pay | Admitting: Internal Medicine

## 2012-06-21 NOTE — Telephone Encounter (Signed)
I spoke with patient and she indicated she is taking this medication and never stopped taking medication and she is not sure as to why it is not on her medication list

## 2012-06-26 ENCOUNTER — Encounter (HOSPITAL_COMMUNITY)
Admission: RE | Admit: 2012-06-26 | Discharge: 2012-06-26 | Disposition: A | Discharge status: Home / Self Care | Payer: Medicare Other | Source: Ambulatory Visit | Attending: Specialist | Admitting: Specialist

## 2012-06-26 DIAGNOSIS — M25559 Pain in unspecified hip: Secondary | ICD-10-CM | POA: Insufficient documentation

## 2012-06-26 DIAGNOSIS — M25551 Pain in right hip: Secondary | ICD-10-CM

## 2012-06-26 MED ORDER — TECHNETIUM TC 99M MEDRONATE IV KIT
25.0000 | PACK | Freq: Once | INTRAVENOUS | Status: AC | PRN
  Administered 2012-06-26: 25 via INTRAVENOUS

## 2012-07-04 ENCOUNTER — Ambulatory Visit (INDEPENDENT_AMBULATORY_CARE_PROVIDER_SITE_OTHER): Payer: Medicare Other | Admitting: Internal Medicine

## 2012-07-04 ENCOUNTER — Encounter: Payer: Self-pay | Admitting: Internal Medicine

## 2012-07-04 VITALS — BP 124/82 | HR 77 | Temp 97.5°F | Wt 176.0 lb

## 2012-07-04 DIAGNOSIS — M899 Disorder of bone, unspecified: Secondary | ICD-10-CM

## 2012-07-04 DIAGNOSIS — R600 Localized edema: Secondary | ICD-10-CM

## 2012-07-04 DIAGNOSIS — N289 Disorder of kidney and ureter, unspecified: Secondary | ICD-10-CM

## 2012-07-04 DIAGNOSIS — D649 Anemia, unspecified: Secondary | ICD-10-CM

## 2012-07-04 DIAGNOSIS — M8580 Other specified disorders of bone density and structure, unspecified site: Secondary | ICD-10-CM

## 2012-07-04 DIAGNOSIS — R609 Edema, unspecified: Secondary | ICD-10-CM

## 2012-07-04 MED ORDER — FUROSEMIDE 20 MG PO TABS: 40.0000 mg | ORAL_TABLET | Freq: Every day | ORAL | Status: DC

## 2012-07-04 NOTE — Patient Instructions (Addendum)
Special studies including intact parathormone levels and serum protein electrophoresis and immunoelectrophoresis will be ordered if the blood  tests are abnormal. Review and correct the record as indicated. Please share record with all medical staff seen.

## 2012-07-04 NOTE — Progress Notes (Signed)
Subjective:    Patient ID: Krystal Henderson, female    DOB: 15-Nov-1932, 79 y.o.   MRN: [id number]  HPI The pelvic pain began 4 days after cystoscopy & [place]   @ Dr  McDiamid's office.Dr Tonita Cong, Orthopedist, found 4 fractures of the pelvis on bone scan.  She had fractured her right upper extremity and pelvis and a fall 12/30/11.  She requires cane for ambulation.   She is followed by Dr. Justin Mend, nephrologist for mild renal insufficiency.   The pain was treated with Tramadol  ; response was partial insignificant   Review of Systems    Constitutional: no fever, chills, sweats. Change in weight of 10# with inactivity Musculoskeletal:no  muscle cramps or pain; no  joint stiffness, redness.  Skin:no rash, color/temp change Neuro: chronic burning in feet Heme:no lymphadenopathy; abnormal bruising or bleeding                                                                              Objective:   Physical Exam Gen.: Healthy and well-nourished in appearance. Alert, appropriate and cooperative throughout exam.Appears younger than stated age  Eyes: No corneal or conjunctival inflammation noted. No icterus Mouth: Oral mucosa and oropharynx reveal no lesions or exudates. Dentures in good repair. Neck: No deformities, masses, or tenderness noted.  Thyroid normal. Lungs: Normal respiratory effort; chest expands symmetrically. Lungs are clear to auscultation without rales, wheezes, or increased work of breathing. Heart: Normal rate and rhythm. Normal S1 and S2. No gallop, click, or rub. Grade 1/6 systolic murmur Abdomen: Bowel sounds normal; abdomen soft and nontender. No masses, organomegaly or hernias noted.                            Musculoskeletal/extremities: No deformity or scoliosis noted of  the thoracic or lumbar spine.  No clubbing, cyanosis, or significant extremity  deformity noted. Range of motion normal .Tone & strength  Normal. Joints  reveal minor  DJD DIP changes. Nail health  good. Able to lie down & sit up w/o help. Negative SLR bilaterally Vascular: Carotid, radial artery, dorsalis pedis and  posterior tibial pulses are full and equal. No bruits present.Tense ankle edema w/o pitting Neurologic: Alert and oriented x3. Deep tendon reflexes symmetrical and normal.       Skin: Intact without suspicious lesions or rashes. Lymph: No cervical, axillary lymphadenopathy present. Psych: Mood and affect are normal. Normally interactive                                                                                       Assessment & Plan:  #1 status post fractures of the pelvis (ala and superior and inferior pubic rami bilaterally). Although she has had pelvic fracture in September 2013; the history would suggest pathologic fracture  #2 osteopenia with -1.9 T score at  the femoral neck. Status post 5 years of bisphosphonate therapy. This would not explain the fractures.  #3 edema  #4 mild renal insufficiency.  Plan: See orders and recommendations  #3

## 2012-07-05 LAB — CBC WITH DIFFERENTIAL/PLATELET
Basophils Absolute: 0 10*3/uL (ref 0.0–0.1)
Eosinophils Absolute: 0.1 10*3/uL (ref 0.0–0.7)
Hemoglobin: 11.1 g/dL — ABNORMAL LOW (ref 12.0–15.0)
Lymphocytes Relative: 32 % (ref 12.0–46.0)
Monocytes Relative: 6.3 % (ref 3.0–12.0)
Neutro Abs: 4.4 10*3/uL (ref 1.4–7.7)
Neutrophils Relative %: 59.1 % (ref 43.0–77.0)
Platelets: 383 10*3/uL (ref 150.0–400.0)
RDW: 16.7 % — ABNORMAL HIGH (ref 11.5–14.6)

## 2012-07-05 LAB — BASIC METABOLIC PANEL
Calcium: 9.1 mg/dL (ref 8.4–10.5)
Creatinine, Ser: 1.4 mg/dL — ABNORMAL HIGH (ref 0.4–1.2)
GFR: 39.62 mL/min — ABNORMAL LOW (ref >60.00)
Glucose, Bld: 78 mg/dL (ref 70–99)
Sodium: 137 mEq/L (ref 135–145)

## 2012-07-05 LAB — HEPATIC FUNCTION PANEL
AST: 23 U/L (ref 0–37)
Alkaline Phosphatase: 117 U/L (ref 39–117)
Bilirubin, Direct: 0.1 mg/dL (ref 0.0–0.3)
Total Bilirubin: 0.5 mg/dL (ref 0.3–1.2)

## 2012-07-10 ENCOUNTER — Encounter: Payer: Self-pay | Admitting: Internal Medicine

## 2012-07-11 ENCOUNTER — Encounter: Payer: Self-pay | Admitting: Internal Medicine

## 2012-07-18 ENCOUNTER — Telehealth: Payer: Self-pay | Admitting: Internal Medicine

## 2012-07-18 DIAGNOSIS — R600 Localized edema: Secondary | ICD-10-CM

## 2012-07-18 MED ORDER — FUROSEMIDE 20 MG PO TABS: 40.0000 mg | ORAL_TABLET | Freq: Every day | ORAL | Status: DC

## 2012-07-18 NOTE — Telephone Encounter (Signed)
RX changed and resubmitted

## 2012-07-18 NOTE — Telephone Encounter (Signed)
Request from [facility] # [phone] requesting to change to a 1 month supply of Furosemide (#60) rather than #30.

## 2012-08-16 ENCOUNTER — Other Ambulatory Visit: Payer: Self-pay | Admitting: Neurology

## 2012-09-21 ENCOUNTER — Other Ambulatory Visit: Payer: Self-pay | Admitting: Internal Medicine

## 2012-12-12 ENCOUNTER — Ambulatory Visit: Payer: Self-pay | Admitting: Neurology

## 2012-12-25 ENCOUNTER — Other Ambulatory Visit: Payer: Self-pay | Admitting: Internal Medicine

## 2012-12-27 NOTE — Telephone Encounter (Signed)
Med filled.  

## 2013-01-04 ENCOUNTER — Ambulatory Visit (INDEPENDENT_AMBULATORY_CARE_PROVIDER_SITE_OTHER): Payer: Medicare Other

## 2013-01-04 DIAGNOSIS — Z23 Encounter for immunization: Secondary | ICD-10-CM

## 2013-02-26 ENCOUNTER — Other Ambulatory Visit: Payer: Self-pay | Admitting: Neurology

## 2013-02-26 NOTE — Telephone Encounter (Signed)
Patient's prescription was faxed over to [facility] at [phone].

## 2013-02-27 NOTE — Telephone Encounter (Signed)
Patient's prescription was faxed over to [facility] at [phone].

## 2013-03-26 ENCOUNTER — Telehealth: Payer: Self-pay

## 2013-03-26 NOTE — Telephone Encounter (Signed)
Medication and allergies:  Reviewed and updated  90 day supply/mail order: na Local pharmacy: [facility] on [address]   Immunizations due:  shingles  A/P:   No changes to [place] or PSH or personal hx Female Dr Ubaldo Glassing MMG--2011 [facility]CS--10/2009--Dr Patterson--diverticula/no polyps Flu vaccine--01/2013 Td--03/2011 PNA--12/2011, 2011  To Discuss with Provider: Dropped down to one tablet on Lasix due to night time incontinence Throat feels like she is choking at times; relieved with Xanax

## 2013-03-27 ENCOUNTER — Encounter: Payer: Self-pay | Admitting: Internal Medicine

## 2013-03-27 ENCOUNTER — Ambulatory Visit (INDEPENDENT_AMBULATORY_CARE_PROVIDER_SITE_OTHER): Payer: Medicare Other | Admitting: Internal Medicine

## 2013-03-27 VITALS — BP 102/62 | HR 67 | Temp 97.8°F | Ht 64.75 in | Wt 174.0 lb

## 2013-03-27 DIAGNOSIS — M899 Disorder of bone, unspecified: Secondary | ICD-10-CM

## 2013-03-27 DIAGNOSIS — E782 Mixed hyperlipidemia: Secondary | ICD-10-CM

## 2013-03-27 DIAGNOSIS — I10 Essential (primary) hypertension: Secondary | ICD-10-CM

## 2013-03-27 DIAGNOSIS — Z86010 Personal history of colonic polyps: Secondary | ICD-10-CM

## 2013-03-27 DIAGNOSIS — G609 Hereditary and idiopathic neuropathy, unspecified: Secondary | ICD-10-CM

## 2013-03-27 DIAGNOSIS — Z0000 Encounter for general adult medical examination without abnormal findings: Secondary | ICD-10-CM

## 2013-03-27 DIAGNOSIS — M8580 Other specified disorders of bone density and structure, unspecified site: Secondary | ICD-10-CM

## 2013-03-27 LAB — TSH: TSH: 3.28 u[IU]/mL (ref 0.35–5.50)

## 2013-03-27 LAB — LDL CHOLESTEROL, DIRECT: Direct LDL: 131 mg/dL

## 2013-03-27 LAB — BASIC METABOLIC PANEL
BUN: 25 mg/dL — ABNORMAL HIGH (ref 6–23)
CO2: 29 mEq/L (ref 19–32)
Chloride: 101 mEq/L (ref 96–112)
Creatinine, Ser: 1.6 mg/dL — ABNORMAL HIGH (ref 0.4–1.2)
Glucose, Bld: 98 mg/dL (ref 70–99)
Potassium: 4.3 mEq/L (ref 3.5–5.1)

## 2013-03-27 LAB — CBC WITH DIFFERENTIAL/PLATELET
Eosinophils Absolute: 0.1 10*3/uL (ref 0.0–0.7)
Eosinophils Relative: 2.2 % (ref 0.0–5.0)
HCT: 39 % (ref 36.0–46.0)
Lymphs Abs: 2 10*3/uL (ref 0.7–4.0)
MCHC: 33 g/dL (ref 30.0–36.0)
MCV: 93.5 fl (ref 78.0–100.0)
Monocytes Absolute: 0.4 10*3/uL (ref 0.1–1.0)
Neutrophils Relative %: 57.2 % (ref 43.0–77.0)
Platelets: 350 10*3/uL (ref 150.0–400.0)

## 2013-03-27 LAB — LIPID PANEL
Cholesterol: 226 mg/dL — ABNORMAL HIGH (ref 0–200)
HDL: 79.3 mg/dL (ref >39.00)
Triglycerides: 67 mg/dL (ref 0.0–149.0)

## 2013-03-27 LAB — HEPATIC FUNCTION PANEL
ALT: 18 U/L (ref 0–35)
Bilirubin, Direct: 0.1 mg/dL (ref 0.0–0.3)
Total Bilirubin: 0.5 mg/dL (ref 0.3–1.2)
Total Protein: 7.5 g/dL (ref 6.0–8.3)

## 2013-03-27 MED ORDER — AMLODIPINE BESYLATE 5 MG PO TABS: ORAL_TABLET | ORAL | Status: DC

## 2013-03-27 MED ORDER — ALPRAZOLAM 0.25 MG PO TABS: ORAL_TABLET | ORAL | Status: DC

## 2013-03-27 MED ORDER — FUROSEMIDE 20 MG PO TABS: 20.0000 mg | ORAL_TABLET | Freq: Every day | ORAL | Status: DC

## 2013-03-27 MED ORDER — LOSARTAN POTASSIUM 100 MG PO TABS: ORAL_TABLET | ORAL | Status: DC

## 2013-03-27 NOTE — Progress Notes (Signed)
Subjective:    Patient ID: Krystal Henderson, female    DOB: 1933/03/07, 79 y.o.   MRN: [id number]  HPI  Medicare Wellness Visit: Psychosocial and medical history were reviewed as required by Medicare (history related to abuse, antisocial behavior , firearm risk). Social history: Caffeine: no , Alcohol: rarely , Tobacco [id number] Exercise:see below Personal safety/fall risk:no but peripheral neuropathy present Limitations of activities of daily living:no Seatbelt/ smoke alarm use:yes  Healthcare Power of Attorney/Living Will status: in plate Ophthalmologic exam status:current Hearing evaluation status:not current Orientation: Oriented X 3 Memory and recall: good Math testing: good Depression/anxiety assessment: no Foreign travel history: 2008 [place]  Immunization status for influenza/pneumonia/ shingles /tetanus:Shingles Transfusion history:no Preventive health care maintenance status: Colonoscopy/BMD/mammogram/Pap as per protocol/standard care:Gyn F/U due Dental care:every 12 mos Chart reviewed and updated. Active issues reviewed and addressed as documented below.    Review of Systems A modified heart healthy diet is followed; exercise encompasses 45-60 minutes 3  times per week as  Water aerobics without symptoms.  Family history is + for premature coronary disease in a brother. Advanced cholesterol testing reveals  LDL goal is less than 140 ; ideally < 110 . No statin to date. Low dose ASA taken Specifically denied are  chest pain, palpitations, dyspnea, or claudication.  BP @ home 105/62-137/85.     Objective:   Physical Exam  Gen.: Healthy and well-nourished in appearance. Alert, appropriate and cooperative throughout exam.Appears younger than stated age  Head: Normocephalic without obvious abnormalities Eyes: No corneal or conjunctival inflammation noted. Pupils equal round reactive to light and accommodation. Extraocular motion intact.  Ears: External  ear exam reveals  no significant lesions or deformities. Canals clear .TMs normal. Hearing is grossly normal bilaterally. Nose: External nasal exam reveals no deformity or inflammation. Nasal mucosa are pink and moist. No lesions or exudates noted.   Mouth: Oral mucosa and oropharynx reveal no lesions or exudates. Teeth in good repair. Neck: No deformities, masses, or tenderness noted. Range of motion decreased. Thyroid normal. Lungs: Normal respiratory effort; chest expands symmetrically. Lungs are clear to auscultation without rales, wheezes, or increased work of breathing. Heart: Normal rate and rhythm. Normal S1 and S2. No gallop, click, or rub. Grade 1/2- 1 over6 systolic murmur. Abdomen: Bowel sounds normal; abdomen soft and nontender. No masses, organomegaly or hernias noted. Genitalia: as per Gyn                                  Musculoskeletal/extremities: Minimally accentuated curvature of upper thoracic spine. No clubbing, cyanosis, edema, or significant extremity  deformity noted. Range of motion decreased @ knees .Tone & strength normal. Hand joints normal . Fingernail  health good. Able to lie down & sit up w/o help. Negative SLR bilaterally Vascular: Carotid, radial artery, dorsalis pedis and  posterior tibial pulses are  equal. Pedal pulses decreased.No bruits present. Neurologic: Alert and oriented x3. Deep tendon reflexes symmetrical and normal.        Skin: Intact without suspicious lesions or rashes. Lymph: No cervical, axillary lymphadenopathy present. Psych: Mood and affect are normal. Normally interactive  Assessment & Plan:  #1 Medicare Wellness Exam; criteria met ; data entered #2 Problem List/Diagnoses reviewed Plan:  Assessments made/ Orders entered  

## 2013-03-27 NOTE — Patient Instructions (Signed)
Your next office appointment will be determined based upon review of your pending labs. Those instructions will be transmitted to you through My Chart . 

## 2013-03-27 NOTE — Progress Notes (Signed)
Pre visit review using our clinic review tool, if applicable. No additional management support is needed unless otherwise documented below in the visit note. 

## 2013-03-28 ENCOUNTER — Other Ambulatory Visit: Payer: Self-pay | Admitting: Internal Medicine

## 2013-03-30 ENCOUNTER — Other Ambulatory Visit: Payer: Self-pay | Admitting: Internal Medicine

## 2013-03-30 DIAGNOSIS — E559 Vitamin D deficiency, unspecified: Secondary | ICD-10-CM | POA: Insufficient documentation

## 2013-03-30 LAB — VITAMIN D 1,25 DIHYDROXY: Vitamin D 1, 25 (OH)2 Total: 24 pg/mL (ref 18–72)

## 2013-06-20 ENCOUNTER — Ambulatory Visit (INDEPENDENT_AMBULATORY_CARE_PROVIDER_SITE_OTHER): Payer: Medicare Other | Admitting: Neurology

## 2013-06-20 ENCOUNTER — Ambulatory Visit: Payer: Self-pay | Admitting: Neurology

## 2013-06-20 ENCOUNTER — Encounter: Payer: Self-pay | Admitting: Neurology

## 2013-06-20 VITALS — BP 121/61 | HR 65 | Wt 172.0 lb

## 2013-06-20 DIAGNOSIS — G609 Hereditary and idiopathic neuropathy, unspecified: Secondary | ICD-10-CM

## 2013-06-20 MED ORDER — PREGABALIN 25 MG PO CAPS: 25.0000 mg | ORAL_CAPSULE | Freq: Three times a day (TID) | ORAL | Status: DC

## 2013-06-20 MED ORDER — PREGABALIN 100 MG PO CAPS: 100.0000 mg | ORAL_CAPSULE | Freq: Every day | ORAL | Status: DC

## 2013-06-20 NOTE — Progress Notes (Signed)
Reason for visit: Peripheral neuropathy  Krystal Henderson is an 80 y.o. female  History of present illness:  Krystal Henderson is a 80 year old right-handed white female with a history of a small fiber peripheral neuropathy. The patient is on Lyrica taking 100 mg in the evening, and 25 mg twice during the day. The patient indicates that towards the late afternoon or early evening, she begins to have some burning sensations in her feet that go up one half way up the leg below the knee. The patient denies any significant balance issues, and she has not had any falls. The patient does well at nighttime after she takes her 100 mg capsules of Lyrica. The patient sleeps well. The patient returns to this office for further evaluation. No other significant medical issues have come up since last seen.  Past Medical History  Diagnosis Date  . HTN (hypertension)   . Hyperlipemia 2006    LDL 130  . Esophageal reflux     inactive  . Anxiety     PMH of  . Hypoglycemia     reactive  . Peripheral neuropathy   . Vitamin B12 deficiency   . Personal history of colonic polyps     adenomatous  . Diverticulosis of colon (without mention of hemorrhage)   . DJD (degenerative joint disease)   . Angiodysplasia 2007    @ colonoscopy  . Pneumonia 2012    hx of pneumonia   . Anemia     hx of   . Pelvic fracture 12/30/2011    [facility]    Past Surgical History  Procedure Laterality Date  . Tear duct probing      X 2  . Total hip arthroplasty  2000    right  . Facial cosmetic surgery    . Tubal ligation    . Hemorroidectomy    . Dilation and curettage of uterus    . Colonoscopy w/ polypectomy      X 2 , Dr  Verl Blalock; angiodysplasia. Due 2022  . Lumbar laminectomy/decompression microdiscectomy  09/10/2011    Procedure: LUMBAR LAMINECTOMY/DECOMPRESSION MICRODISCECTOMY;  Surgeon: Johnn Hai, MD;  Location: [facility];  Service: Orthopedics;  Laterality: N/A;  decompression laminectomy L2-3,  L3-4, L4-5  . Orif wrist fracture  01/11/2012    Procedure: OPEN REDUCTION INTERNAL FIXATION (ORIF) WRIST FRACTURE;  Surgeon: Tennis Must, MD;  Location: [place];  Service: Orthopedics;  Laterality: Right;  . Cataract surgery  12-26-12 and 01-09-13  . Colonoscopy  2011    neg    Family History  Problem Relation Age of Onset  . Colon cancer Neg Hx   . Diabetes Neg Hx   . Heart attack Father 70  . Kidney disease Mother     renal failure  . Throat cancer Sister     smoker  . Osteoporosis Sister   . Heart attack Brother 62  . Stroke Brother 58    smoker  . Depression Maternal Uncle   . Cancer Brother     X3  lung cancer, all smokers  . Peripheral vascular disease Daughter     Social history:  reports that she has never smoked. She has never used smokeless tobacco. She reports that she drinks alcohol. She reports that she does not use illicit drugs.    Allergies  Allergen Reactions  . Codeine     REACTION: nausea  . Duloxetine     REACTION: intolerance  . Sertraline Hcl  REACTION: intolerance    Medications:  Current Outpatient Prescriptions on File Prior to Visit  Medication Sig Dispense Refill  . ALPRAZolam (XANAX) 0.25 MG tablet 1 every 8-12 hrs prn , not routinely  30 tablet  0  . amLODipine (NORVASC) 5 MG tablet take 1 tablet by mouth once daily  30 tablet  11  . Ascorbic Acid (VITAMIN C) 1000 MG tablet Take 1,000 mg by mouth daily.       [name] aspirin 81 MG tablet Take 81 mg by mouth daily.       . Calcium Carbonate (CALCIUM 600 PO) Take 1 capsule by mouth daily.       . Cholecalciferol (VITAMIN D) 2000 UNITS CAPS Take 1 capsule by mouth daily.       . furosemide (LASIX) 20 MG tablet Take 1 tablet (20 mg total) by mouth daily.  30 tablet  11  . losartan (COZAAR) 100 MG tablet take 1 tablet by mouth once daily  30 tablet  11  . Multiple Vitamin (MULTIVITAMIN WITH MINERALS) TABS Take 1 tablet by mouth daily.      . Omega-3 Fatty Acids (FISH OIL) 1000  MG CAPS Take 1 capsule by mouth daily.       . polyethylene glycol (MIRALAX / GLYCOLAX) packet Take 17 g by mouth daily.  10 each  0  . traMADol (ULTRAM) 50 MG tablet Take 50 mg by mouth every 6 (six) hours as needed.       No current facility-administered medications on file prior to visit.    ROS:  Out of a complete 14 system review of symptoms, the patient complains only of the following symptoms, and all other reviewed systems are negative.  Frequent waking Back pain, walking difficulty  Blood pressure 121/61, pulse 65, weight 172 lb (78.019 kg).  Physical Exam  General: The patient is alert and cooperative at the time of the examination.  Skin: No significant peripheral edema is noted.   Neurologic Exam  Mental status: The patient is oriented x 3.  Cranial nerves: Facial symmetry is present. Speech is normal, no aphasia or dysarthria is noted. Extraocular movements are full. Visual fields are full.  Motor: The patient has good strength in all 4 extremities.  Sensory examination: Soft touch sensation in the face, arms, and legs is symmetric.  Coordination: The patient has good finger-nose-finger and heel-to-shin bilaterally.  Gait and station: The patient has a normal gait. Tandem gait is slightly unsteady. Romberg is negative. No drift is seen.  Reflexes: Deep tendon reflexes are symmetric.   Assessment/Plan:  1. Peripheral neuropathy, small fiber  The patient will be increased on Lyrica taking 25 mg 3 times daily. The patient will remain on the 100 mg capsule at night. The patient will followup through this office in one year. The patient will contact me if she has any other significant issues with her peripheral neuropathy.  Jill Alexanders MD 06/20/2013 11:24 AM  [facility] [address] [place], [place] [address]  Phone [phone] Fax [phone]

## 2013-06-20 NOTE — Patient Instructions (Signed)

## 2013-08-21 ENCOUNTER — Other Ambulatory Visit: Payer: Self-pay | Admitting: Neurology

## 2013-08-21 MED ORDER — PREGABALIN 100 MG PO CAPS: 100.0000 mg | ORAL_CAPSULE | Freq: Every day | ORAL | Status: DC

## 2013-08-21 MED ORDER — PREGABALIN 25 MG PO CAPS: 25.0000 mg | ORAL_CAPSULE | Freq: Three times a day (TID) | ORAL | Status: DC

## 2013-08-21 NOTE — Telephone Encounter (Signed)
Rx signed and faxed.

## 2013-08-21 NOTE — Telephone Encounter (Signed)
Pt called has lost her prescriptions and wants to know if Dr. Jannifer Franklin can call in her medication for pregabalin (LYRICA) 25 MG capsule, pregabalin (LYRICA) 50 MG capsule, & pregabalin (LYRICA) 100 MG capsule to her pharmacy [facility]. Please call pt back once this has been called in. Thanks

## 2013-08-21 NOTE — Telephone Encounter (Signed)
Request forwarded to provider for approval  

## 2013-11-19 ENCOUNTER — Other Ambulatory Visit: Payer: Self-pay | Admitting: Internal Medicine

## 2013-11-19 NOTE — Telephone Encounter (Signed)
This medication is among those which experts have documented to have a very  high risk of affecting  mental  alertness  & balance. This results in increased risk of falling with serious health or life threatening injury. Such medication should be taken as infrequently as possible and @  the lowest possible dose.It should not be taken with alcohol, sedatives  or other agents which have a similar  adverse risk potential. These risks are greater as we age as there is decreased ability of the liver and kidneys to metabolize and excrete the medication, resulting in   increased blood levels of the active ingredient. #30 but take as noted above

## 2013-11-26 ENCOUNTER — Other Ambulatory Visit (INDEPENDENT_AMBULATORY_CARE_PROVIDER_SITE_OTHER): Payer: Medicare Other

## 2013-11-26 ENCOUNTER — Ambulatory Visit (INDEPENDENT_AMBULATORY_CARE_PROVIDER_SITE_OTHER)
Admission: RE | Admit: 2013-11-26 | Discharge: 2013-11-26 | Disposition: A | Discharge status: Home / Self Care | Payer: Medicare Other | Source: Ambulatory Visit | Attending: Internal Medicine | Admitting: Internal Medicine

## 2013-11-26 ENCOUNTER — Encounter: Payer: Self-pay | Admitting: Internal Medicine

## 2013-11-26 ENCOUNTER — Ambulatory Visit (INDEPENDENT_AMBULATORY_CARE_PROVIDER_SITE_OTHER): Payer: Medicare Other | Admitting: Internal Medicine

## 2013-11-26 VITALS — BP 160/74 | HR 64 | Temp 98.3°F | Wt 174.2 lb

## 2013-11-26 DIAGNOSIS — N289 Disorder of kidney and ureter, unspecified: Secondary | ICD-10-CM

## 2013-11-26 DIAGNOSIS — G629 Polyneuropathy, unspecified: Secondary | ICD-10-CM

## 2013-11-26 DIAGNOSIS — R5381 Other malaise: Secondary | ICD-10-CM

## 2013-11-26 DIAGNOSIS — E538 Deficiency of other specified B group vitamins: Secondary | ICD-10-CM

## 2013-11-26 DIAGNOSIS — G609 Hereditary and idiopathic neuropathy, unspecified: Secondary | ICD-10-CM

## 2013-11-26 DIAGNOSIS — R0989 Other specified symptoms and signs involving the circulatory and respiratory systems: Secondary | ICD-10-CM

## 2013-11-26 DIAGNOSIS — I10 Essential (primary) hypertension: Secondary | ICD-10-CM

## 2013-11-26 DIAGNOSIS — R5383 Other fatigue: Secondary | ICD-10-CM

## 2013-11-26 DIAGNOSIS — R0609 Other forms of dyspnea: Secondary | ICD-10-CM

## 2013-11-26 DIAGNOSIS — E559 Vitamin D deficiency, unspecified: Secondary | ICD-10-CM

## 2013-11-26 LAB — TSH: TSH: 3.53 u[IU]/mL (ref 0.35–4.50)

## 2013-11-26 NOTE — Assessment & Plan Note (Signed)
Methyl

## 2013-11-26 NOTE — Assessment & Plan Note (Signed)
Vit D level.

## 2013-11-26 NOTE — Progress Notes (Signed)
   Subjective:    Patient ID: Krystal Henderson, female    DOB: Jan 20, 1933, 80 y.o.   MRN: [id number]  HPI   She is complaining of fatigue which is a constant process. She describes emotional & physical stress triggers. She remains fatigued even at rest. Her weight has been stable. She does have hoarseness. She describes dyspnea on exertion after walking 25-50 yards. She does have some edema with dependency of the legs. She has some arthralgias without associated joint swelling or redness. She does describe some imbalance issues but has not fallen since 02/2012 when she fractured her right upper extremity. She states her husband describe some snoring without apnea. Anxiety and depression her issues. She was intolerant to sertraline; Cymbalta was also tried w/o benefit.  She is followed by Dr. Jannifer Franklin for painful peripheral neuropathy and is on Lyrica 25 mg in the morning; 50 mg at lunch; and 100 mg at bedtime. She's been intolerant to Cymbalta in the past.  She is followed by Dr. Justin Mend, nephrologist. He completed lab studies last week at [facility]. The results of the tests will be requested.  Blood pressures at home range 90/46-116/78.  Our last records 03/27/13 reveal a creatinine of 1.6; BUN 25; and GFR of 32.36. At that time TSH was 3.28. CBC and differential was normal. Vitamin D level has been low.   Review of Systems  She denies fever, chills, sweats, or weight loss as noted. She has no blurred vision, double vision, or loss of vision. She denies any extrinsic symptoms or angioedema. She has no dysphagia. There is no cough or sputum production. She has no chest pain, palpitations, or paroxysmal nocturnal dyspnea No bowel changes constipation or diarrhea.She has no melena or rectal bleeding. She also denies myalgias. There's been no rash or tick exposure She has no numbness or tingling or weakness in her extremities but has severe burning. No new change in hair, skin, nails. She  has no enlarged lymph nodes or abnormal bleeding.     Objective:   Physical Exam  Positive or pertinent findings include:  decreased range of motion cervical spine. She has complete dentures. S4 is noted with slight slurring. There is decreased range of motion of the right hip.  General appearance :adequately nourished; in no distress. Eyes: No conjunctival inflammation or scleral icterus is present. Oral exam:  Lips and gums are healthy appearing.There is no oropharyngeal erythema or exudate noted.  Heart:  Normal rate and regular rhythm. S1 and S2 normal without gallop, murmur, click, or rub . Lungs:Chest clear to auscultation; no wheezes, rhonchi,rales ,or rubs present.No increased work of breathing.  Abdomen: bowel sounds normal, soft and non-tender without masses, organomegaly or hernias noted.  No guarding or rebound. No flank tenderness to percussion. Skin:Warm & dry.  Intact without suspicious lesions or rashes ; no jaundice or tenting Lymphatic: No lymphadenopathy is noted about the head, neck, axilla            Assessment & Plan:  #1 chronic fatigue with some anxiety/depression component. Intolerance to sertraline and Cymbalta.  #2 exertional dyspnea, rule out anginal equivalent.  #3 renal insufficiency, as per Dr. Justin Mend. His results will be requested.  See orders and recommendations.

## 2013-11-26 NOTE — Progress Notes (Signed)
Pre visit review using our clinic review tool, if applicable. No additional management support is needed unless otherwise documented below in the visit note. 

## 2013-11-26 NOTE — Patient Instructions (Addendum)
Please obtain results of Dr Jason Nest lab tests. Minimal Blood Pressure Goal= AVERAGE < 140/90;  Ideal is an AVERAGE < 135/85. This AVERAGE should be calculated from @ least 5-7 BP readings taken @ different times of day on different days of week. You should not respond to isolated BP readings , but rather the AVERAGE for that week .Please bring your  blood pressure cuff to office visits to verify that it is reliable.It  can also be checked against the blood pressure device at the pharmacy. Finger or wrist cuffs are not dependable.  Your next office appointment will be determined based upon review of your pending labs & x-rays. Those instructions will be transmitted to you through My Chart.   Please have your sleep pattern observed. Of concern would be excess snoring or spells of stopping breathing (apnea). If these are suggested based on this monitor; a sleep specialty evaluation is indicated.

## 2013-11-26 NOTE — Assessment & Plan Note (Signed)
Blood pressure goals reviewed. BMET done by Dr Justin Mend last week

## 2013-11-28 LAB — METHYLMALONIC ACID, SERUM: METHYLMALONIC ACID, QUANT: 285 nmol/L (ref 87–318)

## 2013-12-23 IMAGING — CT CT WRIST*R* W/O CM
3 of 4 series · 11 of 20 positions shown, 13 images · non-contrast
Comparison: 12/30/2011

CLINICAL DATA: Further evaluation of a distal right radius fracture

CT OF THE RIGHT WRIST WITHOUT CONTRAST
TECHNIQUE: Multidetector CT imaging was performed according to the
standard protocol. Multiplanar CT image reconstructions were also
generated.

[Series 2: rt wrist 2.0 u90u · axial · 0.31mm/px · z∈[+804,+888]mm · 6 of 60 slices shown, 8 images]
[im 9/60  soft-tissue]
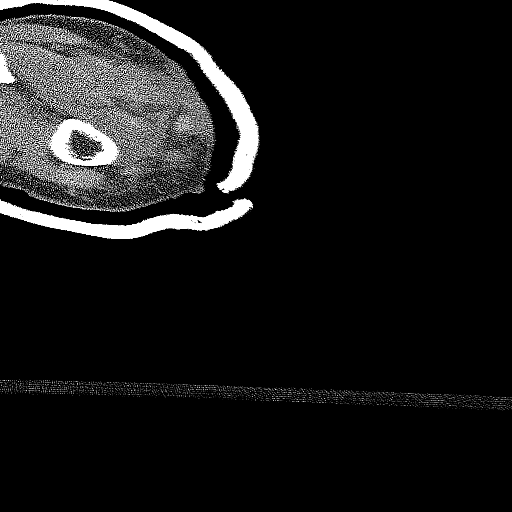
[im 9/60  bone]
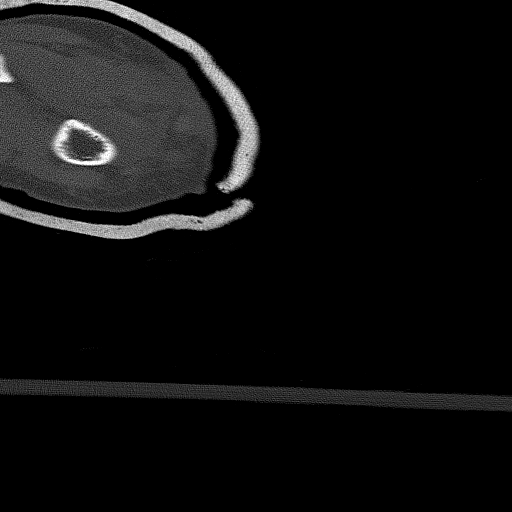
[im 17/60  bone]
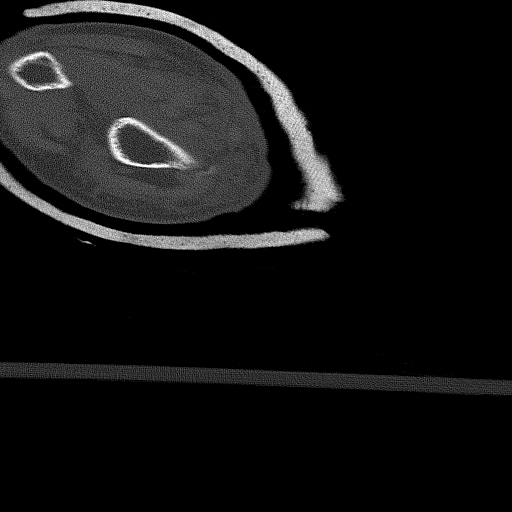
[im 26/60  bone]
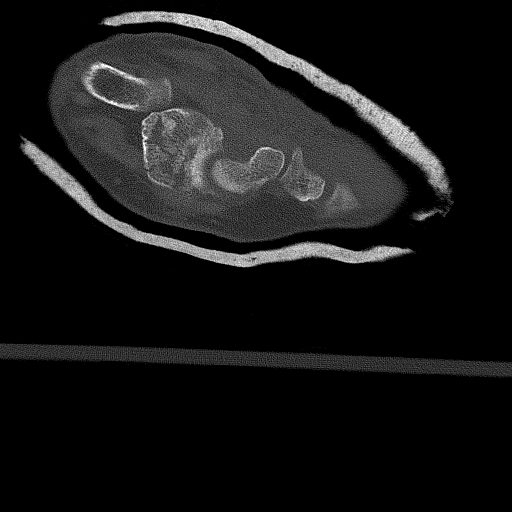
[im 34/60  bone]
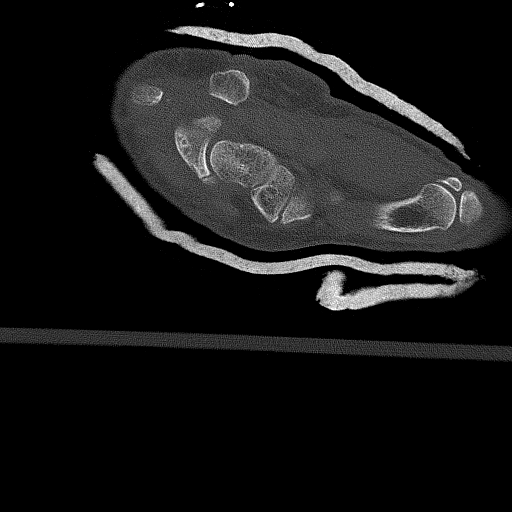
[im 43/60  soft-tissue]
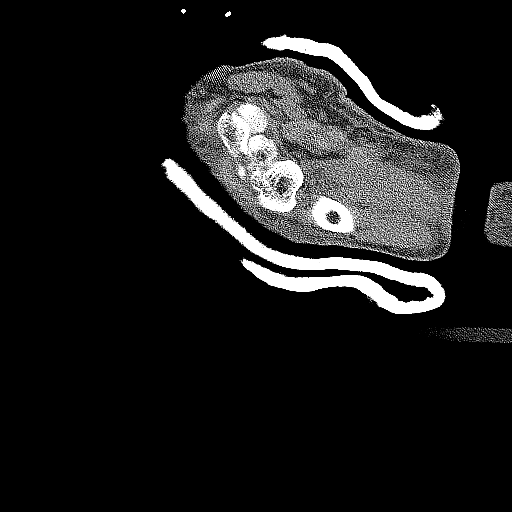
[im 43/60  bone]
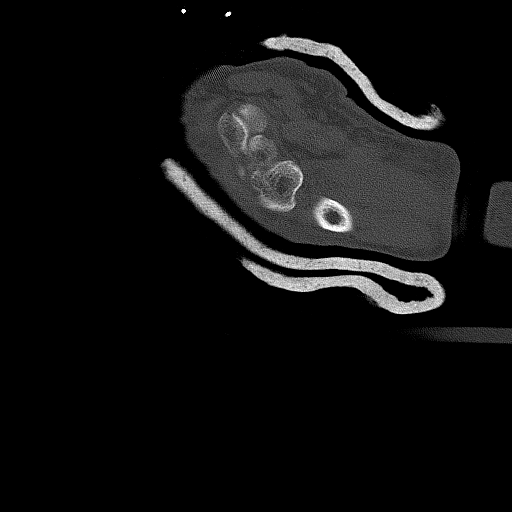
[im 51/60  bone]
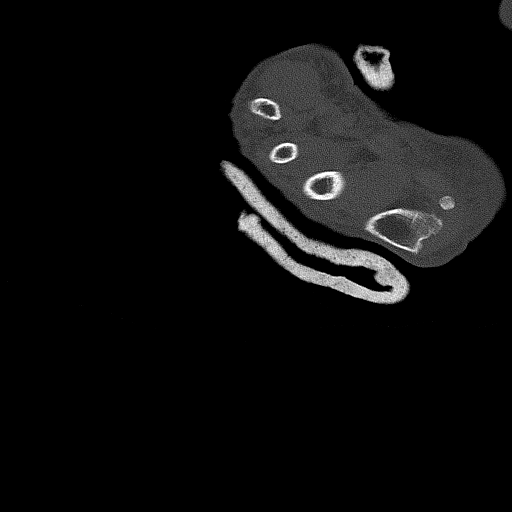

[Series 4: rt wrist 3.0 u30u · axial · 0.31mm/px · z∈[+810,+882]mm · 4 of 40 slices shown]
[im 8/40  bone]
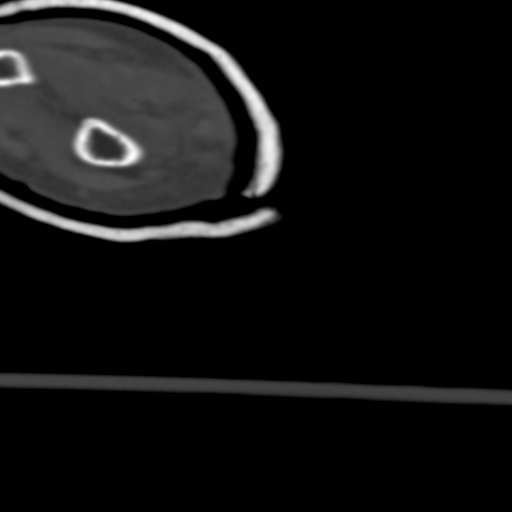
[im 16/40  bone]
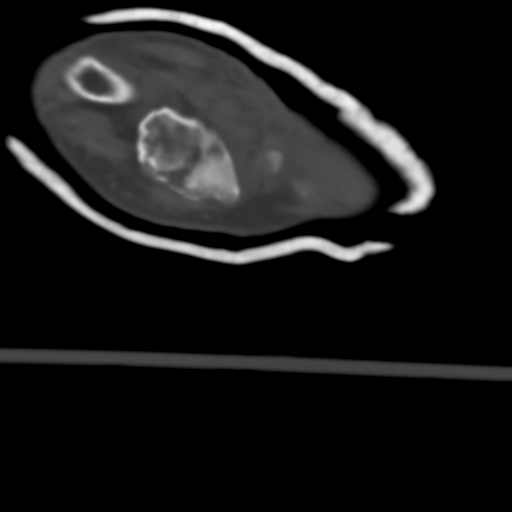
[im 24/40  bone]
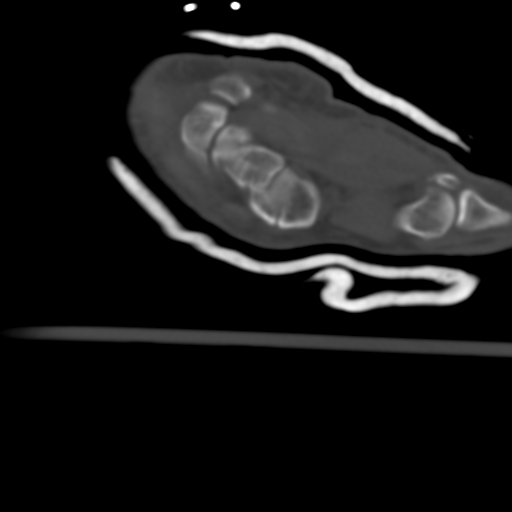
[im 32/40  bone]
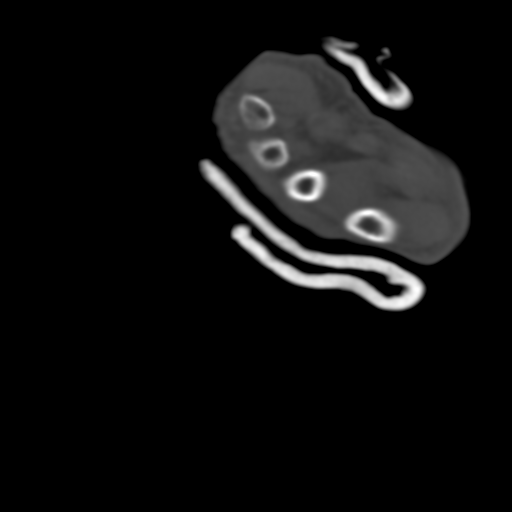

[Series 603: ccor · coronal · 0.31mm/px · 1 of 44 slices shown]
[im 22/44  bone]
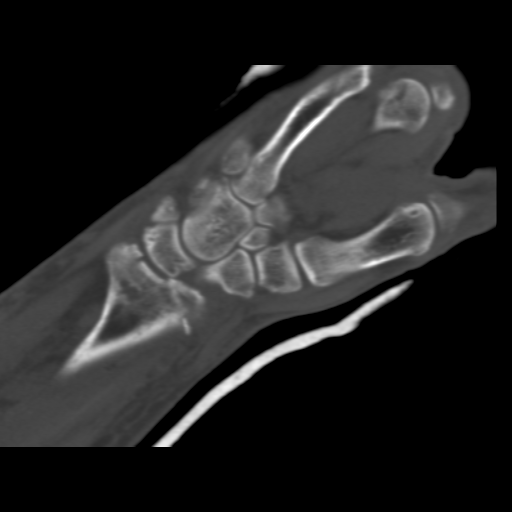

[11 of 20 positions shown; findings below may reference images not displayed]

FINDINGS: There is a comminuted fracture of the distal radius.  The
primary fracture line is transverse, across the metaphysis.  There
are secondary fracture lines that intersect the articular surface,
between the scaphoid and lunate facets and across the center of the
lunate facet.  This yields a comminuted fracture fragments
involving a portion of the lunate facet that is minimally
depressed, by 3 mm.  The fractures also lead to the dorsal
angulation of the articular surface of 20 degrees.  Radial
inclination is maintained as is overall radial length.

There are too small bony fragments adjacent to the ulnar styloid
consistent with small avulsion fractures. This could reflect acute
fractures or be chronic.

No other fractures. There are some small cysts and several carpal
bones most notably the lunate.  This is most evident along the
dorsal ulnar aspect of the lunate.

There is surrounding soft tissue swelling.
IMPRESSION: Comminuted fracture of the distal right radius with dorsal
angulation of the articular surface as detailed above.  Small bony
fragments adjacent to the ulnar styloid may reflect additional
acute fractures or be chronic.

## 2014-02-11 ENCOUNTER — Ambulatory Visit (INDEPENDENT_AMBULATORY_CARE_PROVIDER_SITE_OTHER): Payer: Medicare Other | Admitting: Family Medicine

## 2014-02-11 ENCOUNTER — Encounter: Payer: Self-pay | Admitting: Family Medicine

## 2014-02-11 VITALS — BP 140/82 | HR 67 | Temp 98.2°F | Wt 178.8 lb

## 2014-02-11 DIAGNOSIS — M25511 Pain in right shoulder: Secondary | ICD-10-CM

## 2014-02-11 DIAGNOSIS — G609 Hereditary and idiopathic neuropathy, unspecified: Secondary | ICD-10-CM

## 2014-02-11 DIAGNOSIS — M25519 Pain in unspecified shoulder: Secondary | ICD-10-CM | POA: Insufficient documentation

## 2014-02-11 DIAGNOSIS — Z23 Encounter for immunization: Secondary | ICD-10-CM

## 2014-02-11 DIAGNOSIS — G629 Polyneuropathy, unspecified: Secondary | ICD-10-CM

## 2014-02-11 MED ORDER — GABAPENTIN 100 MG PO CAPS: 100.0000 mg | ORAL_CAPSULE | Freq: Three times a day (TID) | ORAL | Status: DC

## 2014-02-11 NOTE — Progress Notes (Signed)
Krystal Henderson is a 80 y.o. female  Shoulder Pain: Patient complaints of right shoulder pain. This is evaluated as a personal injury. The pain is described as aching.  The onset of the pain was sudden, starting about 1 month ago.  The pain occurs when active and lasts few seconds.  Location is anterior. No history of dislocation. Symptoms are aggravated by reaching. Symptoms are diminisohed by  rest.   Limited activities include: stopped using weights at gym. no weakness, no swelling, no crepitus noted is reported. Patient is a retired and she has not missed gym.   Past Medical History  Diagnosis Date  . HTN (hypertension)   . Hyperlipemia 2006    LDL 130  . Esophageal reflux     inactive  . Anxiety     PMH of  . Hypoglycemia     reactive  . Peripheral neuropathy   . Vitamin B12 deficiency   . Personal history of colonic polyps     adenomatous  . Diverticulosis of colon (without mention of hemorrhage)   . DJD (degenerative joint disease)   . Angiodysplasia 2007    @ colonoscopy  . Pneumonia 2012    hx of pneumonia   . Anemia     hx of   . Pelvic fracture 12/30/2011    [facility]   Past Surgical History  Procedure Laterality Date  . Tear duct probing      X 2  . Total hip arthroplasty  2000    right  . Facial cosmetic surgery    . Tubal ligation    . Hemorroidectomy    . Dilation and curettage of uterus    . Colonoscopy w/ polypectomy      X 2 , Dr  Verl Blalock; angiodysplasia. Due 2022  . Lumbar laminectomy/decompression microdiscectomy  09/10/2011    Procedure: LUMBAR LAMINECTOMY/DECOMPRESSION MICRODISCECTOMY;  Surgeon: Johnn Hai, MD;  Location: [facility];  Service: Orthopedics;  Laterality: N/A;  decompression laminectomy L2-3, L3-4, L4-5  . Orif wrist fracture  01/11/2012    Procedure: OPEN REDUCTION INTERNAL FIXATION (ORIF) WRIST FRACTURE;  Surgeon: Tennis Must, MD;  Location: [place];  Service: Orthopedics;  Laterality: Right;  .  Cataract surgery  12-26-12 and 01-09-13  . Colonoscopy  2011    neg   Current outpatient prescriptions: ALPRAZolam (XANAX) 0.25 MG tablet, 1 every 8-12 hrs prn , not routinely, Disp: 30 tablet, Rfl: 0;  amLODipine (NORVASC) 5 MG tablet, take 1 tablet by mouth once daily, Disp: 30 tablet, Rfl: 11;  Ascorbic Acid (VITAMIN C) 1000 MG tablet, Take 1,000 mg by mouth daily. , Disp: , Rfl: ;  aspirin 81 MG tablet, Take 81 mg by mouth daily. , Disp: , Rfl:  Calcium Carbonate (CALCIUM 600 PO), Take 1 capsule by mouth daily. , Disp: , Rfl: ;  Cholecalciferol (VITAMIN D) 2000 UNITS CAPS, Take 1 capsule by mouth daily. , Disp: , Rfl: ;  furosemide (LASIX) 20 MG tablet, Take 1 tablet (20 mg total) by mouth daily., Disp: 30 tablet, Rfl: 11;  losartan (COZAAR) 100 MG tablet, take 1 tablet by mouth once daily, Disp: 30 tablet, Rfl: 11 meclizine (ANTIVERT) 25 MG tablet, take 1/2 to 1 tablet by mouth once daily, Disp: 30 tablet, Rfl: 0;  Multiple Vitamin (MULTIVITAMIN WITH MINERALS) TABS, Take 1 tablet by mouth daily., Disp: , Rfl: ;  Omega-3 Fatty Acids (FISH OIL) 1000 MG CAPS, Take 1 capsule by mouth daily. , Disp: ,  Rfl: ;  polyethylene glycol (MIRALAX / GLYCOLAX) packet, Take 17 g by mouth daily., Disp: 10 each, Rfl: 0 pregabalin (LYRICA) 100 MG capsule, Take 1 capsule (100 mg total) by mouth at bedtime., Disp: 90 capsule, Rfl: 1;  pregabalin (LYRICA) 25 MG capsule, Take 1 capsule (25 mg total) by mouth 3 (three) times daily. (Patient taking differently: Take 25 mg by mouth 3 (three) times daily. Take 1 in the morning and 2 at lunch), Disp: 270 capsule, Rfl: 1;  traMADol (ULTRAM) 50 MG tablet, Take 50 mg by mouth every 6 (six) hours as needed., Disp: , Rfl:  Allergies  Allergen Reactions  . Codeine     REACTION: nausea  . Duloxetine     REACTION: intolerance  . Sertraline Hcl     REACTION: intolerance    reports that she has never smoked. She has never used smokeless tobacco. She reports that she drinks alcohol.  She reports that she does not use illicit drugs. Family History  Problem Relation Age of Onset  . Colon cancer Neg Hx   . Diabetes Neg Hx   . Heart attack Father 37  . Kidney disease Mother     renal failure  . Throat cancer Sister     smoker  . Osteoporosis Sister   . Heart attack Brother 60  . Stroke Brother 54    smoker  . Depression Maternal Uncle   . Cancer Brother     X3  lung cancer, all smokers  . Peripheral vascular disease Daughter     Shoulder: Inspection reveals no abnormalities, atrophy or asymmetry. Palpation is normal with no tenderness over AC joint or bicipital groove. Pain lateralR arm over deltoid with palpation.  No pain with movement today.  ROM is full in all planes. Rotator cuff strength normal throughout. No apprehension sign.

## 2014-02-11 NOTE — Patient Instructions (Signed)

## 2014-02-11 NOTE — Assessment & Plan Note (Signed)
Rest--- hold off on water aerobics for now Ice, tylenol  Pat did not want a sling Call or rto if symptoms do not improve and we will refer to sport med

## 2014-02-11 NOTE — Assessment & Plan Note (Signed)
Pt would like to switch to neurontin because lyrica is so expensive neurontin 100 mg tid-- call in 3-4 days if no relief and we can inc dose

## 2014-02-11 NOTE — Progress Notes (Signed)
Pre visit review using our clinic review tool, if applicable. No additional management support is needed unless otherwise documented below in the visit note. 

## 2014-02-12 ENCOUNTER — Other Ambulatory Visit: Payer: Self-pay

## 2014-02-12 DIAGNOSIS — Z1231 Encounter for screening mammogram for malignant neoplasm of breast: Secondary | ICD-10-CM

## 2014-02-26 ENCOUNTER — Other Ambulatory Visit: Payer: Self-pay | Admitting: Internal Medicine

## 2014-02-27 ENCOUNTER — Telehealth: Payer: Self-pay | Admitting: Family Medicine

## 2014-02-27 NOTE — Telephone Encounter (Signed)
[name] name:Bantz, Everett Relation to PO:[id number] Call back [phone] Pharmacy:[facility]  Reason for call: pt is need rx furosemide (LASIX) 20 MG tablet,  And amLODipine (NORVASC) 5 MG .

## 2014-03-04 ENCOUNTER — Telehealth: Payer: Self-pay

## 2014-03-04 MED ORDER — FUROSEMIDE 20 MG PO TABS: 20.0000 mg | ORAL_TABLET | Freq: Every day | ORAL | Status: DC

## 2014-03-04 MED ORDER — AMLODIPINE BESYLATE 5 MG PO TABS: ORAL_TABLET | ORAL | Status: DC

## 2014-03-04 NOTE — Telephone Encounter (Signed)
Rx faxed.    KP 

## 2014-03-04 NOTE — Telephone Encounter (Signed)
Duplicate request.    KP 

## 2014-03-06 ENCOUNTER — Ambulatory Visit
Admission: RE | Admit: 2014-03-06 | Discharge: 2014-03-06 | Disposition: A | Discharge status: Home / Self Care | Payer: Medicare Other | Source: Ambulatory Visit

## 2014-03-06 DIAGNOSIS — Z1231 Encounter for screening mammogram for malignant neoplasm of breast: Secondary | ICD-10-CM

## 2014-03-18 ENCOUNTER — Other Ambulatory Visit: Payer: Self-pay | Admitting: Neurology

## 2014-03-19 ENCOUNTER — Telehealth: Payer: Self-pay

## 2014-03-19 MED ORDER — PREGABALIN 100 MG PO CAPS: 100.0000 mg | ORAL_CAPSULE | Freq: Every day | ORAL | Status: DC

## 2014-03-19 MED ORDER — PREGABALIN 25 MG PO CAPS: 25.0000 mg | ORAL_CAPSULE | Freq: Three times a day (TID) | ORAL | Status: DC

## 2014-03-19 NOTE — Telephone Encounter (Signed)
Rx signed and faxed.

## 2014-03-20 NOTE — Telephone Encounter (Signed)
I called and spoke with pharmacy.  They do have Rx, however, have not filled it yet.  They will contact patient when it's ready for pick up

## 2014-03-20 NOTE — Telephone Encounter (Signed)
Patient is calling to get a refill for Lyrica. Patient states [facility] says that our office has not responded to pharmacies request. Please resend. Thank you.

## 2014-03-27 ENCOUNTER — Telehealth: Payer: Self-pay | Admitting: Family Medicine

## 2014-03-27 MED ORDER — LOSARTAN POTASSIUM 100 MG PO TABS: ORAL_TABLET | ORAL | Status: DC

## 2014-03-27 NOTE — Telephone Encounter (Signed)
[name] name:Asay, Braden Relation to PO:[phone] Call back number:[phone] Pharmacy:[facility]  Reason for call: pt is needing rx losartan (COZAAR) 100 MG tablet

## 2014-03-28 ENCOUNTER — Encounter: Payer: Medicare Other | Admitting: Internal Medicine

## 2014-03-28 ENCOUNTER — Encounter: Payer: Medicare Other | Admitting: Family Medicine

## 2014-04-19 ENCOUNTER — Ambulatory Visit (INDEPENDENT_AMBULATORY_CARE_PROVIDER_SITE_OTHER): Payer: Medicare Other | Admitting: Family Medicine

## 2014-04-19 ENCOUNTER — Encounter: Payer: Self-pay | Admitting: Family Medicine

## 2014-04-19 VITALS — BP 132/70 | HR 72 | Temp 98.1°F | Ht 64.75 in | Wt 177.8 lb

## 2014-04-19 DIAGNOSIS — F419 Anxiety disorder, unspecified: Secondary | ICD-10-CM

## 2014-04-19 DIAGNOSIS — M8580 Other specified disorders of bone density and structure, unspecified site: Secondary | ICD-10-CM | POA: Diagnosis not present

## 2014-04-19 DIAGNOSIS — Z0000 Encounter for general adult medical examination without abnormal findings: Secondary | ICD-10-CM

## 2014-04-19 DIAGNOSIS — I10 Essential (primary) hypertension: Secondary | ICD-10-CM

## 2014-04-19 LAB — POCT URINALYSIS DIPSTICK
BILIRUBIN UA: NEGATIVE
Blood, UA: NEGATIVE
Glucose, UA: NEGATIVE
Ketones, UA: NEGATIVE
LEUKOCYTES UA: NEGATIVE
NITRITE UA: NEGATIVE
Protein, UA: NEGATIVE
Spec Grav, UA: 1.02
Urobilinogen, UA: NEGATIVE
pH, UA: 6.5

## 2014-04-19 LAB — CBC WITH DIFFERENTIAL/PLATELET
BASOS PCT: 0.5 % (ref 0.0–3.0)
Basophils Absolute: 0 10*3/uL (ref 0.0–0.1)
Eosinophils Absolute: 0.1 10*3/uL (ref 0.0–0.7)
Eosinophils Relative: 1.1 % (ref 0.0–5.0)
HCT: 40.6 % (ref 36.0–46.0)
Hemoglobin: 13.2 g/dL (ref 12.0–15.0)
LYMPHS ABS: 1.9 10*3/uL (ref 0.7–4.0)
LYMPHS PCT: 32.4 % (ref 12.0–46.0)
MCHC: 32.5 g/dL (ref 30.0–36.0)
MCV: 94.4 fl (ref 78.0–100.0)
MONOS PCT: 6.7 % (ref 3.0–12.0)
Monocytes Absolute: 0.4 10*3/uL (ref 0.1–1.0)
Neutro Abs: 3.5 10*3/uL (ref 1.4–7.7)
Neutrophils Relative %: 59.3 % (ref 43.0–77.0)
PLATELETS: 287 10*3/uL (ref 150.0–400.0)
RBC: 4.3 Mil/uL (ref 3.87–5.11)
RDW: 15.2 % (ref 11.5–15.5)
WBC: 5.9 10*3/uL (ref 4.0–10.5)

## 2014-04-19 LAB — LIPID PANEL
CHOL/HDL RATIO: 3
CHOLESTEROL: 233 mg/dL — AB (ref 0–200)
HDL: 91.5 mg/dL (ref >39.00)
LDL Cholesterol: 130 mg/dL — ABNORMAL HIGH (ref 0–99)
NONHDL: 141.5
TRIGLYCERIDES: 60 mg/dL (ref 0.0–149.0)
VLDL: 12 mg/dL (ref 0.0–40.0)

## 2014-04-19 LAB — BASIC METABOLIC PANEL
BUN: 28 mg/dL — AB (ref 6–23)
CALCIUM: 9.9 mg/dL (ref 8.4–10.5)
CHLORIDE: 103 meq/L (ref 96–112)
CO2: 30 meq/L (ref 19–32)
CREATININE: 1.48 mg/dL — AB (ref 0.40–1.20)
GFR: 36.08 mL/min — ABNORMAL LOW (ref >60.00)
GLUCOSE: 94 mg/dL (ref 70–99)
POTASSIUM: 4.6 meq/L (ref 3.5–5.1)
SODIUM: 139 meq/L (ref 135–145)

## 2014-04-19 LAB — HEPATIC FUNCTION PANEL
ALBUMIN: 4.1 g/dL (ref 3.5–5.2)
ALT: 15 U/L (ref 0–35)
AST: 18 U/L (ref 0–37)
Alkaline Phosphatase: 67 U/L (ref 39–117)
BILIRUBIN TOTAL: 0.5 mg/dL (ref 0.2–1.2)
Bilirubin, Direct: 0.1 mg/dL (ref 0.0–0.3)
Total Protein: 7.7 g/dL (ref 6.0–8.3)

## 2014-04-19 MED ORDER — ALPRAZOLAM 0.25 MG PO TABS: ORAL_TABLET | ORAL | Status: DC

## 2014-04-19 NOTE — Patient Instructions (Signed)
Preventive Care for Adults A healthy lifestyle and preventive care can promote health and wellness. Preventive health guidelines for women include the following key practices.  A routine yearly physical is a good way to check with your health care provider about your health and preventive screening. It is a chance to share any concerns and updates on your health and to receive a thorough exam.  Visit your dentist for a routine exam and preventive care every 6 months. Brush your teeth twice a day and floss once a day. Good oral hygiene prevents tooth decay and gum disease.  The frequency of eye exams is based on your age, health, family medical history, use of contact lenses, and other factors. Follow your health care provider's recommendations for frequency of eye exams.  Eat a healthy diet. Foods like vegetables, fruits, whole grains, low-fat dairy products, and lean protein foods contain the nutrients you need without too many calories. Decrease your intake of foods high in solid fats, added sugars, and salt. Eat the right amount of calories for you.Get information about a proper diet from your health care provider, if necessary.  Regular physical exercise is one of the most important things you can do for your health. Most adults should get at least 150 minutes of moderate-intensity exercise (any activity that increases your heart rate and causes you to sweat) each week. In addition, most adults need muscle-strengthening exercises on 2 or more days a week.  Maintain a healthy weight. The body mass index (BMI) is a screening tool to identify possible weight problems. It provides an estimate of body fat based on height and weight. Your health care provider can find your BMI and can help you achieve or maintain a healthy weight.For adults 20 years and older:  A BMI below 18.5 is considered underweight.  A BMI of 18.5 to 24.9 is normal.  A BMI of 25 to 29.9 is considered overweight.  A BMI of  30 and above is considered obese.  Maintain normal blood lipids and cholesterol levels by exercising and minimizing your intake of saturated fat. Eat a balanced diet with plenty of fruit and vegetables. Blood tests for lipids and cholesterol should begin at age 76 and be repeated every 5 years. If your lipid or cholesterol levels are high, you are over 50, or you are at high risk for heart disease, you may need your cholesterol levels checked more frequently.Ongoing high lipid and cholesterol levels should be treated with medicines if diet and exercise are not working.  If you smoke, find out from your health care provider how to quit. If you do not use tobacco, do not start.  Lung cancer screening is recommended for adults aged 22-80 years who are at high risk for developing lung cancer because of a history of smoking. A yearly low-dose CT scan of the lungs is recommended for people who have at least a 30-pack-year history of smoking and are a current smoker or have quit within the past 15 years. A pack year of smoking is smoking an average of 1 pack of cigarettes a day for 1 year (for example: 1 pack a day for 30 years or 2 packs a day for 15 years). Yearly screening should continue until the smoker has stopped smoking for at least 15 years. Yearly screening should be stopped for people who develop a health problem that would prevent them from having lung cancer treatment.  If you are pregnant, do not drink alcohol. If you are breastfeeding,  be very cautious about drinking alcohol. If you are not pregnant and choose to drink alcohol, do not have more than 1 drink per day. One drink is considered to be 12 ounces (355 mL) of beer, 5 ounces (148 mL) of wine, or 1.5 ounces (44 mL) of liquor.  Avoid use of street drugs. Do not share needles with anyone. Ask for help if you need support or instructions about stopping the use of drugs.  High blood pressure causes heart disease and increases the risk of  stroke. Your blood pressure should be checked at least every 1 to 2 years. Ongoing high blood pressure should be treated with medicines if weight loss and exercise do not work.  If you are 75-52 years old, ask your health care provider if you should take aspirin to prevent strokes.  Diabetes screening involves taking a blood sample to check your fasting blood sugar level. This should be done once every 3 years, after age 15, if you are within normal weight and without risk factors for diabetes. Testing should be considered at a younger age or be carried out more frequently if you are overweight and have at least 1 risk factor for diabetes.  Breast cancer screening is essential preventive care for women. You should practice "breast self-awareness." This means understanding the normal appearance and feel of your breasts and may include breast self-examination. Any changes detected, no matter how small, should be reported to a health care provider. Women in their 58s and 30s should have a clinical breast exam (CBE) by a health care provider as part of a regular health exam every 1 to 3 years. After age 16, women should have a CBE every year. Starting at age 53, women should consider having a mammogram (breast X-ray test) every year. Women who have a family history of breast cancer should talk to their health care provider about genetic screening. Women at a high risk of breast cancer should talk to their health care providers about having an MRI and a mammogram every year.  Breast cancer gene (BRCA)-related cancer risk assessment is recommended for women who have family members with BRCA-related cancers. BRCA-related cancers include breast, ovarian, tubal, and peritoneal cancers. Having family members with these cancers may be associated with an increased risk for harmful changes (mutations) in the breast cancer genes BRCA1 and BRCA2. Results of the assessment will determine the need for genetic counseling and  BRCA1 and BRCA2 testing.  Routine pelvic exams to screen for cancer are no longer recommended for nonpregnant women who are considered low risk for cancer of the pelvic organs (ovaries, uterus, and vagina) and who do not have symptoms. Ask your health care provider if a screening pelvic exam is right for you.  If you have had past treatment for cervical cancer or a condition that could lead to cancer, you need Pap tests and screening for cancer for at least 20 years after your treatment. If Pap tests have been discontinued, your risk factors (such as having a new sexual partner) need to be reassessed to determine if screening should be resumed. Some women have medical problems that increase the chance of getting cervical cancer. In these cases, your health care provider may recommend more frequent screening and Pap tests.  The HPV test is an additional test that may be used for cervical cancer screening. The HPV test looks for the virus that can cause the cell changes on the cervix. The cells collected during the Pap test can be  tested for HPV. The HPV test could be used to screen women aged 30 years and older, and should be used in women of any age who have unclear Pap test results. After the age of 30, women should have HPV testing at the same frequency as a Pap test.  Colorectal cancer can be detected and often prevented. Most routine colorectal cancer screening begins at the age of 50 years and continues through age 75 years. However, your health care provider may recommend screening at an earlier age if you have risk factors for colon cancer. On a yearly basis, your health care provider may provide home test kits to check for hidden blood in the stool. Use of a small camera at the end of a tube, to directly examine the colon (sigmoidoscopy or colonoscopy), can detect the earliest forms of colorectal cancer. Talk to your health care provider about this at age 50, when routine screening begins. Direct  exam of the colon should be repeated every 5-10 years through age 75 years, unless early forms of pre-cancerous polyps or small growths are found.  People who are at an increased risk for hepatitis B should be screened for this virus. You are considered at high risk for hepatitis B if:  You were born in a country where hepatitis B occurs often. Talk with your health care provider about which countries are considered high risk.  Your parents were born in a high-risk country and you have not received a shot to protect against hepatitis B (hepatitis B vaccine).  You have HIV or AIDS.  You use needles to inject street drugs.  You live with, or have sex with, someone who has hepatitis B.  You get hemodialysis treatment.  You take certain medicines for conditions like cancer, organ transplantation, and autoimmune conditions.  Hepatitis C blood testing is recommended for all people born from 1945 through 1965 and any individual with known risks for hepatitis C.  Practice safe sex. Use condoms and avoid high-risk sexual practices to reduce the spread of sexually transmitted infections (STIs). STIs include gonorrhea, chlamydia, syphilis, trichomonas, herpes, HPV, and human immunodeficiency virus (HIV). Herpes, HIV, and HPV are viral illnesses that have no cure. They can result in disability, cancer, and death.  You should be screened for sexually transmitted illnesses (STIs) including gonorrhea and chlamydia if:  You are sexually active and are younger than 24 years.  You are older than 24 years and your health care provider tells you that you are at risk for this type of infection.  Your sexual activity has changed since you were last screened and you are at an increased risk for chlamydia or gonorrhea. Ask your health care provider if you are at risk.  If you are at risk of being infected with HIV, it is recommended that you take a prescription medicine daily to prevent HIV infection. This is  called preexposure prophylaxis (PrEP). You are considered at risk if:  You are a heterosexual woman, are sexually active, and are at increased risk for HIV infection.  You take drugs by injection.  You are sexually active with a partner who has HIV.  Talk with your health care provider about whether you are at high risk of being infected with HIV. If you choose to begin PrEP, you should first be tested for HIV. You should then be tested every 3 months for as long as you are taking PrEP.  Osteoporosis is a disease in which the bones lose minerals and strength   with aging. This can result in serious bone fractures or breaks. The risk of osteoporosis can be identified using a bone density scan. Women ages 65 years and over and women at risk for fractures or osteoporosis should discuss screening with their health care providers. Ask your health care provider whether you should take a calcium supplement or vitamin D to reduce the rate of osteoporosis.  Menopause can be associated with physical symptoms and risks. Hormone replacement therapy is available to decrease symptoms and risks. You should talk to your health care provider about whether hormone replacement therapy is right for you.  Use sunscreen. Apply sunscreen liberally and repeatedly throughout the day. You should seek shade when your shadow is shorter than you. Protect yourself by wearing long sleeves, pants, a wide-brimmed hat, and sunglasses year round, whenever you are outdoors.  Once a month, do a whole body skin exam, using a mirror to look at the skin on your back. Tell your health care provider of new moles, moles that have irregular borders, moles that are larger than a pencil eraser, or moles that have changed in shape or color.  Stay current with required vaccines (immunizations).  Influenza vaccine. All adults should be immunized every year.  Tetanus, diphtheria, and acellular pertussis (Td, Tdap) vaccine. Pregnant women should  receive 1 dose of Tdap vaccine during each pregnancy. The dose should be obtained regardless of the length of time since the last dose. Immunization is preferred during the 27th-36th week of gestation. An adult who has not previously received Tdap or who does not know her vaccine status should receive 1 dose of Tdap. This initial dose should be followed by tetanus and diphtheria toxoids (Td) booster doses every 10 years. Adults with an unknown or incomplete history of completing a 3-dose immunization series with Td-containing vaccines should begin or complete a primary immunization series including a Tdap dose. Adults should receive a Td booster every 10 years.  Varicella vaccine. An adult without evidence of immunity to varicella should receive 2 doses or a second dose if she has previously received 1 dose. Pregnant females who do not have evidence of immunity should receive the first dose after pregnancy. This first dose should be obtained before leaving the health care facility. The second dose should be obtained 4-8 weeks after the first dose.  Human papillomavirus (HPV) vaccine. Females aged 13-26 years who have not received the vaccine previously should obtain the 3-dose series. The vaccine is not recommended for use in pregnant females. However, pregnancy testing is not needed before receiving a dose. If a female is found to be pregnant after receiving a dose, no treatment is needed. In that case, the remaining doses should be delayed until after the pregnancy. Immunization is recommended for any person with an immunocompromised condition through the age of 26 years if she did not get any or all doses earlier. During the 3-dose series, the second dose should be obtained 4-8 weeks after the first dose. The third dose should be obtained 24 weeks after the first dose and 16 weeks after the second dose.  Zoster vaccine. One dose is recommended for adults aged 60 years or older unless certain conditions are  present.  Measles, mumps, and rubella (MMR) vaccine. Adults born before 1957 generally are considered immune to measles and mumps. Adults born in 1957 or later should have 1 or more doses of MMR vaccine unless there is a contraindication to the vaccine or there is laboratory evidence of immunity to   each of the three diseases. A routine second dose of MMR vaccine should be obtained at least 28 days after the first dose for students attending postsecondary schools, health care workers, or international travelers. People who received inactivated measles vaccine or an unknown type of measles vaccine during 1963-1967 should receive 2 doses of MMR vaccine. People who received inactivated mumps vaccine or an unknown type of mumps vaccine before 1979 and are at high risk for mumps infection should consider immunization with 2 doses of MMR vaccine. For females of childbearing age, rubella immunity should be determined. If there is no evidence of immunity, females who are not pregnant should be vaccinated. If there is no evidence of immunity, females who are pregnant should delay immunization until after pregnancy. Unvaccinated health care workers born before 1957 who lack laboratory evidence of measles, mumps, or rubella immunity or laboratory confirmation of disease should consider measles and mumps immunization with 2 doses of MMR vaccine or rubella immunization with 1 dose of MMR vaccine.  Pneumococcal 13-valent conjugate (PCV13) vaccine. When indicated, a person who is uncertain of her immunization history and has no record of immunization should receive the PCV13 vaccine. An adult aged 19 years or older who has certain medical conditions and has not been previously immunized should receive 1 dose of PCV13 vaccine. This PCV13 should be followed with a dose of pneumococcal polysaccharide (PPSV23) vaccine. The PPSV23 vaccine dose should be obtained at least 8 weeks after the dose of PCV13 vaccine. An adult aged 19  years or older who has certain medical conditions and previously received 1 or more doses of PPSV23 vaccine should receive 1 dose of PCV13. The PCV13 vaccine dose should be obtained 1 or more years after the last PPSV23 vaccine dose.  Pneumococcal polysaccharide (PPSV23) vaccine. When PCV13 is also indicated, PCV13 should be obtained first. All adults aged 65 years and older should be immunized. An adult younger than age 65 years who has certain medical conditions should be immunized. Any person who resides in a nursing home or long-term care facility should be immunized. An adult smoker should be immunized. People with an immunocompromised condition and certain other conditions should receive both PCV13 and PPSV23 vaccines. People with human immunodeficiency virus (HIV) infection should be immunized as soon as possible after diagnosis. Immunization during chemotherapy or radiation therapy should be avoided. Routine use of PPSV23 vaccine is not recommended for American Indians, [place] Natives, or people younger than 65 years unless there are medical conditions that require PPSV23 vaccine. When indicated, people who have unknown immunization and have no record of immunization should receive PPSV23 vaccine. One-time revaccination 5 years after the first dose of PPSV23 is recommended for people aged 19-64 years who have chronic kidney failure, nephrotic syndrome, asplenia, or immunocompromised conditions. People who received 1-2 doses of PPSV23 before age 65 years should receive another dose of PPSV23 vaccine at age 65 years or later if at least 5 years have passed since the previous dose. Doses of PPSV23 are not needed for people immunized with PPSV23 at or after age 65 years.  Meningococcal vaccine. Adults with asplenia or persistent complement component deficiencies should receive 2 doses of quadrivalent meningococcal conjugate (MenACWY-D) vaccine. The doses should be obtained at least 2 months apart.  Microbiologists working with certain meningococcal bacteria, military recruits, people at risk during an outbreak, and people who travel to or live in countries with a high rate of meningitis should be immunized. A first-year college student up through age   21 years who is living in a residence hall should receive a dose if she did not receive a dose on or after her 16th birthday. Adults who have certain high-risk conditions should receive one or more doses of vaccine.  Hepatitis A vaccine. Adults who wish to be protected from this disease, have certain high-risk conditions, work with hepatitis A-infected animals, work in hepatitis A research labs, or travel to or work in countries with a high rate of hepatitis A should be immunized. Adults who were previously unvaccinated and who anticipate close contact with an international adoptee during the first 60 days after arrival in the [place] from a country with a high rate of hepatitis A should be immunized.  Hepatitis B vaccine. Adults who wish to be protected from this disease, have certain high-risk conditions, may be exposed to blood or other infectious body fluids, are household contacts or sex partners of hepatitis B positive people, are clients or workers in certain care facilities, or travel to or work in countries with a high rate of hepatitis B should be immunized.  Haemophilus influenzae type b (Hib) vaccine. A previously unvaccinated person with asplenia or sickle cell disease or having a scheduled splenectomy should receive 1 dose of Hib vaccine. Regardless of previous immunization, a recipient of a hematopoietic stem cell transplant should receive a 3-dose series 6-12 months after her successful transplant. Hib vaccine is not recommended for adults with HIV infection. Preventive Services / Frequency Ages 64 to 68 years  Blood pressure check.** / Every 1 to 2 years.  Lipid and cholesterol check.** / Every 5 years beginning at age  22.  Clinical breast exam.** / Every 3 years for women in their 88s and 53s.  BRCA-related cancer risk assessment.** / For women who have family members with a BRCA-related cancer (breast, ovarian, tubal, or peritoneal cancers).  Pap test.** / Every 2 years from ages 90 through 51. Every 3 years starting at age 21 through age 56 or 3 with a history of 3 consecutive normal Pap tests.  HPV screening.** / Every 3 years from ages 24 through ages 1 to 46 with a history of 3 consecutive normal Pap tests.  Hepatitis C blood test.** / For any individual with known risks for hepatitis C.  Skin self-exam. / Monthly.  Influenza vaccine. / Every year.  Tetanus, diphtheria, and acellular pertussis (Tdap, Td) vaccine.** / Consult your health care provider. Pregnant women should receive 1 dose of Tdap vaccine during each pregnancy. 1 dose of Td every 10 years.  Varicella vaccine.** / Consult your health care provider. Pregnant females who do not have evidence of immunity should receive the first dose after pregnancy.  HPV vaccine. / 3 doses over 6 months, if 72 and younger. The vaccine is not recommended for use in pregnant females. However, pregnancy testing is not needed before receiving a dose.  Measles, mumps, rubella (MMR) vaccine.** / You need at least 1 dose of MMR if you were born in 1957 or later. You may also need a 2nd dose. For females of childbearing age, rubella immunity should be determined. If there is no evidence of immunity, females who are not pregnant should be vaccinated. If there is no evidence of immunity, females who are pregnant should delay immunization until after pregnancy.  Pneumococcal 13-valent conjugate (PCV13) vaccine.** / Consult your health care provider.  Pneumococcal polysaccharide (PPSV23) vaccine.** / 1 to 2 doses if you smoke cigarettes or if you have certain conditions.  Meningococcal vaccine.** /  1 dose if you are age 19 to 21 years and a first-year college  student living in a residence hall, or have one of several medical conditions, you need to get vaccinated against meningococcal disease. You may also need additional booster doses.  Hepatitis A vaccine.** / Consult your health care provider.  Hepatitis B vaccine.** / Consult your health care provider.  Haemophilus influenzae type b (Hib) vaccine.** / Consult your health care provider. Ages 40 to 64 years  Blood pressure check.** / Every 1 to 2 years.  Lipid and cholesterol check.** / Every 5 years beginning at age 20 years.  Lung cancer screening. / Every year if you are aged 55-80 years and have a 30-pack-year history of smoking and currently smoke or have quit within the past 15 years. Yearly screening is stopped once you have quit smoking for at least 15 years or develop a health problem that would prevent you from having lung cancer treatment.  Clinical breast exam.** / Every year after age 40 years.  BRCA-related cancer risk assessment.** / For women who have family members with a BRCA-related cancer (breast, ovarian, tubal, or peritoneal cancers).  Mammogram.** / Every year beginning at age 40 years and continuing for as long as you are in good health. Consult with your health care provider.  Pap test.** / Every 3 years starting at age 30 years through age 65 or 70 years with a history of 3 consecutive normal Pap tests.  HPV screening.** / Every 3 years from ages 30 years through ages 65 to 70 years with a history of 3 consecutive normal Pap tests.  Fecal occult blood test (FOBT) of stool. / Every year beginning at age 50 years and continuing until age 75 years. You may not need to do this test if you get a colonoscopy every 10 years.  Flexible sigmoidoscopy or colonoscopy.** / Every 5 years for a flexible sigmoidoscopy or every 10 years for a colonoscopy beginning at age 50 years and continuing until age 75 years.  Hepatitis C blood test.** / For all people born from 1945 through  1965 and any individual with known risks for hepatitis C.  Skin self-exam. / Monthly.  Influenza vaccine. / Every year.  Tetanus, diphtheria, and acellular pertussis (Tdap/Td) vaccine.** / Consult your health care provider. Pregnant women should receive 1 dose of Tdap vaccine during each pregnancy. 1 dose of Td every 10 years.  Varicella vaccine.** / Consult your health care provider. Pregnant females who do not have evidence of immunity should receive the first dose after pregnancy.  Zoster vaccine.** / 1 dose for adults aged 60 years or older.  Measles, mumps, rubella (MMR) vaccine.** / You need at least 1 dose of MMR if you were born in 1957 or later. You may also need a 2nd dose. For females of childbearing age, rubella immunity should be determined. If there is no evidence of immunity, females who are not pregnant should be vaccinated. If there is no evidence of immunity, females who are pregnant should delay immunization until after pregnancy.  Pneumococcal 13-valent conjugate (PCV13) vaccine.** / Consult your health care provider.  Pneumococcal polysaccharide (PPSV23) vaccine.** / 1 to 2 doses if you smoke cigarettes or if you have certain conditions.  Meningococcal vaccine.** / Consult your health care provider.  Hepatitis A vaccine.** / Consult your health care provider.  Hepatitis B vaccine.** / Consult your health care provider.  Haemophilus influenzae type b (Hib) vaccine.** / Consult your health care provider. Ages 65   years and over  Blood pressure check.** / Every 1 to 2 years.  Lipid and cholesterol check.** / Every 5 years beginning at age 22 years.  Lung cancer screening. / Every year if you are aged 73-80 years and have a 30-pack-year history of smoking and currently smoke or have quit within the past 15 years. Yearly screening is stopped once you have quit smoking for at least 15 years or develop a health problem that would prevent you from having lung cancer  treatment.  Clinical breast exam.** / Every year after age 4 years.  BRCA-related cancer risk assessment.** / For women who have family members with a BRCA-related cancer (breast, ovarian, tubal, or peritoneal cancers).  Mammogram.** / Every year beginning at age 40 years and continuing for as long as you are in good health. Consult with your health care provider.  Pap test.** / Every 3 years starting at age 9 years through age 34 or 91 years with 3 consecutive normal Pap tests. Testing can be stopped between 65 and 70 years with 3 consecutive normal Pap tests and no abnormal Pap or HPV tests in the past 10 years.  HPV screening.** / Every 3 years from ages 57 years through ages 64 or 45 years with a history of 3 consecutive normal Pap tests. Testing can be stopped between 65 and 70 years with 3 consecutive normal Pap tests and no abnormal Pap or HPV tests in the past 10 years.  Fecal occult blood test (FOBT) of stool. / Every year beginning at age 15 years and continuing until age 17 years. You may not need to do this test if you get a colonoscopy every 10 years.  Flexible sigmoidoscopy or colonoscopy.** / Every 5 years for a flexible sigmoidoscopy or every 10 years for a colonoscopy beginning at age 86 years and continuing until age 71 years.  Hepatitis C blood test.** / For all people born from [birth year] through 1963 and any individual with known risks for hepatitis C.  Osteoporosis screening.** / A one-time screening for women ages 83 years and over and women at risk for fractures or osteoporosis.  Skin self-exam. / Monthly.  Influenza vaccine. / Every year.  Tetanus, diphtheria, and acellular pertussis (Tdap/Td) vaccine.** / 1 dose of Td every 10 years.  Varicella vaccine.** / Consult your health care provider.  Zoster vaccine.** / 1 dose for adults aged 61 years or older.  Pneumococcal 13-valent conjugate (PCV13) vaccine.** / Consult your health care provider.  Pneumococcal  polysaccharide (PPSV23) vaccine.** / 1 dose for all adults aged 28 years and older.  Meningococcal vaccine.** / Consult your health care provider.  Hepatitis A vaccine.** / Consult your health care provider.  Hepatitis B vaccine.** / Consult your health care provider.  Haemophilus influenzae type b (Hib) vaccine.** / Consult your health care provider. ** Family history and personal history of risk and conditions may change your health care provider's recommendations. Document Released: 05/18/2001 Document Revised: 08/06/2013 Document Reviewed: 08/17/2010 [facility] Patient Information 2015 [place], [place]. This information is not intended to replace advice given to you by your health care provider. Make sure you discuss any questions you have with your health care provider.

## 2014-04-19 NOTE — Progress Notes (Signed)
Subjective:    Krystal Henderson is a 81 y.o. female who presents for Medicare Annual/Subsequent preventive examination.  Preventive Screening-Counseling & Management  Tobacco History  Smoking status  . Never Smoker   Smokeless tobacco  . Never Used     Problems Prior to Visit 1. none  Current Problems (verified) Patient Active Problem List   Diagnosis Date Noted  . Pain in joint, shoulder region 02/11/2014  . Unspecified vitamin D deficiency 03/30/2013  . Chronic renal insufficiency, stage III (moderate) 03/28/2013  . Acute kidney injury 01/01/2012  . Spinal stenosis 12/23/2011  . Osteopenia 12/23/2011  . Renal insufficiency, mild 08/24/2011  . Vitamin B12 deficiency 09/24/2010  . IBS (irritable bowel syndrome) 09/24/2010  . Peripheral neuropathy, idiopathic 09/24/2010  . GERD 08/12/2009  . DIVERTICULOSIS, COLON 09/08/2007  . ANGIODYSPLASIA OF INTESTINE WITH HEMORRHAGE 09/08/2007  . ARTHRITIS 09/08/2007  . COLONIC POLYPS, ADENOMATOUS, HX OF 09/08/2007  . HYPERLIPIDEMIA 01/23/2007  . HYPERTENSION, ESSENTIAL NOS 01/23/2007  . HYPOGLYCEMIA, REACTIVE 06/30/2006    Medications Prior to Visit Current Outpatient Prescriptions on File Prior to Visit  Medication Sig Dispense Refill  . amLODipine (NORVASC) 5 MG tablet take 1 tablet by mouth once daily 30 tablet 5  . Ascorbic Acid (VITAMIN C) 1000 MG tablet Take 1,000 mg by mouth daily.     [name] aspirin 81 MG tablet Take 81 mg by mouth daily.     . Calcium Carbonate (CALCIUM 600 PO) Take 1 capsule by mouth daily.     . Cholecalciferol (VITAMIN D) 2000 UNITS CAPS Take 1 capsule by mouth daily.     . furosemide (LASIX) 20 MG tablet Take 1 tablet (20 mg total) by mouth daily. 30 tablet 5  . losartan (COZAAR) 100 MG tablet take 1 tablet by mouth once daily 30 tablet 5  . meclizine (ANTIVERT) 25 MG tablet take 1/2 to 1 tablet by mouth once daily 30 tablet 0  . Multiple Vitamin (MULTIVITAMIN WITH MINERALS) TABS Take 1 tablet by  mouth daily.    . Omega-3 Fatty Acids (FISH OIL) 1000 MG CAPS Take 1 capsule by mouth daily.     . polyethylene glycol (MIRALAX / GLYCOLAX) packet Take 17 g by mouth daily. 10 each 0  . pregabalin (LYRICA) 100 MG capsule Take 1 capsule (100 mg total) by mouth at bedtime. 90 capsule 1  . pregabalin (LYRICA) 25 MG capsule Take 1 capsule (25 mg total) by mouth 3 (three) times daily. 270 capsule 1  . traMADol (ULTRAM) 50 MG tablet Take 50 mg by mouth every 6 (six) hours as needed.     No current facility-administered medications on file prior to visit.    Current Medications (verified) Current Outpatient Prescriptions  Medication Sig Dispense Refill  . ALPRAZolam (XANAX) 0.25 MG tablet 1 every 8-12 hrs prn , not routinely 30 tablet 0  . amLODipine (NORVASC) 5 MG tablet take 1 tablet by mouth once daily 30 tablet 5  . Ascorbic Acid (VITAMIN C) 1000 MG tablet Take 1,000 mg by mouth daily.     [name] aspirin 81 MG tablet Take 81 mg by mouth daily.     . Calcium Carbonate (CALCIUM 600 PO) Take 1 capsule by mouth daily.     . Cholecalciferol (VITAMIN D) 2000 UNITS CAPS Take 1 capsule by mouth daily.     . furosemide (LASIX) 20 MG tablet Take 1 tablet (20 mg total) by mouth daily. 30 tablet 5  . losartan (COZAAR) 100 MG tablet take 1  tablet by mouth once daily 30 tablet 5  . meclizine (ANTIVERT) 25 MG tablet take 1/2 to 1 tablet by mouth once daily 30 tablet 0  . Multiple Vitamin (MULTIVITAMIN WITH MINERALS) TABS Take 1 tablet by mouth daily.    . Omega-3 Fatty Acids (FISH OIL) 1000 MG CAPS Take 1 capsule by mouth daily.     . polyethylene glycol (MIRALAX / GLYCOLAX) packet Take 17 g by mouth daily. 10 each 0  . pregabalin (LYRICA) 100 MG capsule Take 1 capsule (100 mg total) by mouth at bedtime. 90 capsule 1  . pregabalin (LYRICA) 25 MG capsule Take 1 capsule (25 mg total) by mouth 3 (three) times daily. 270 capsule 1  . traMADol (ULTRAM) 50 MG tablet Take 50 mg by mouth every 6 (six) hours as needed.      No current facility-administered medications for this visit.     Allergies (verified) Codeine; Duloxetine; and Sertraline hcl   PAST HISTORY  Family History Family History  Problem Relation Age of Onset  . Colon cancer Neg Hx   . Diabetes Neg Hx   . Heart attack Father 73  . Kidney disease Mother     renal failure  . Throat cancer Sister     smoker  . Osteoporosis Sister   . Heart attack Brother 40  . Stroke Brother 47    smoker  . Depression Maternal Uncle   . Cancer Brother     X3  lung cancer, all smokers  . Peripheral vascular disease Daughter     Social History History  Substance Use Topics  . Smoking status: Never Smoker   . Smokeless tobacco: Never Used  . Alcohol Use: Yes     Comment: wine occasionally     Are there smokers in your home (other than you)? No  Risk Factors Current exercise habits: rush--3x a week  Dietary issues discussed: na   Cardiac risk factors: advanced age (older than 55 for men, 38 for women) and hypertension.  Depression Screen (Note: if answer to either of the following is "Yes", a more complete depression screening is indicated)   Over the past two weeks, have you felt down, depressed or hopeless? No  Over the past two weeks, have you felt little interest or pleasure in doing things? No  Have you lost interest or pleasure in daily life? No  Do you often feel hopeless? No  Do you cry easily over simple problems? No  Activities of Daily Living In your present state of health, do you have any difficulty performing the following activities?:  Driving? No Managing money?  No Feeding yourself? No Getting from bed to chair? No{exam, Climbing a flight of stairs? No Preparing food and eating?: No Bathing or showering? No Getting dressed: No Getting to the toilet? No Using the toilet:No Moving around from place to place: No In the past year have you fallen or had a near fall?:No   Are you sexually active?  Yes  Do you  have more than one partner?  No  Hearing Difficulties: No Do you often ask people to speak up or repeat themselves? No Do you experience ringing or noises in your ears? No Do you have difficulty understanding soft or whispered voices? No   Do you feel that you have a problem with memory? No  Do you often misplace items? No  Do you feel safe at home?  Yes  Cognitive Testing  Alert? Yes  Normal Appearance?Yes  Oriented to  person? Yes  Place? Yes   Time? Yes  Recall of three objects?  Yes  Can perform simple calculations? Yes  Displays appropriate judgment?Yes  Can read the correct time from a watch face?Yes   Advanced Directives have been discussed with the patient? Yes  List the Names of Other Physician/Practitioners you currently use: 1.  opth-- stoneburner 2. Dentist-- Edsel Petrin 3.  Neuro-- willis 4. Nephrology-- Heloise Ochoa any recent Medical Services you may have received from other than [facility] providers in the past year (date may be approximate).  Immunization History  Administered Date(s) Administered  . Influenza Split 01/01/2012  . Influenza Whole 01/03/2008, 12/30/2009  . Influenza, High Dose Seasonal PF 01/04/2013, 01/08/2014  . Pneumococcal Conjugate-13 02/11/2014  . Pneumococcal Polysaccharide-23 12/30/2009, 01/01/2012  . Tdap 03/15/2011  . Zoster 04/30/2013    Screening Tests Health Maintenance  Topic Date Due  . INFLUENZA VACCINE  11/04/2014  . COLONOSCOPY  10/04/2019  . TETANUS/TDAP  03/14/2021  . DEXA SCAN  Completed  . PNEUMOCOCCAL POLYSACCHARIDE VACCINE AGE 78 AND OVER  Completed  . ZOSTAVAX  Completed    All answers were reviewed with the patient and necessary referrals were made:  Garnet Koyanagi, DO   04/19/2014   History reviewed:  She  has a past medical history of HTN (hypertension); Hyperlipemia (2006); Esophageal reflux; Anxiety; Hypoglycemia; Peripheral neuropathy; Vitamin B12 deficiency; Personal history of colonic polyps; Diverticulosis  of colon (without mention of hemorrhage); DJD (degenerative joint disease); Angiodysplasia (2007); Pneumonia (2012); Anemia; and Pelvic fracture (12/30/2011). She  does not have any pertinent problems on file. She  has past surgical history that includes Tear duct probing; Total hip arthroplasty (2000); Facial cosmetic surgery; Tubal ligation; Hemorroidectomy; Dilation and curettage of uterus; Colonoscopy w/ polypectomy; Lumbar laminectomy/decompression microdiscectomy (09/10/2011); ORIF wrist fracture (01/11/2012); cataract surgery (12-26-12 and 01-09-13); and Colonoscopy (2011). Her family history includes Cancer in her brother; Depression in her maternal uncle; Heart attack (age of onset: 23) in her brother; Heart attack (age of onset: 31) in her father; Kidney disease in her mother; Osteoporosis in her sister; Peripheral vascular disease in her daughter; Stroke (age of onset: 51) in her brother; Throat cancer in her sister. There is no history of Colon cancer or Diabetes. She  reports that she has never smoked. She has never used smokeless tobacco. She reports that she drinks alcohol. She reports that she does not use illicit drugs. She has a current medication list which includes the following prescription(s): alprazolam, amlodipine, vitamin c, aspirin, calcium carbonate, vitamin d, furosemide, losartan, meclizine, multivitamin with minerals, fish oil, polyethylene glycol, pregabalin, pregabalin, and tramadol. Current Outpatient Prescriptions on File Prior to Visit  Medication Sig Dispense Refill  . amLODipine (NORVASC) 5 MG tablet take 1 tablet by mouth once daily 30 tablet 5  . Ascorbic Acid (VITAMIN C) 1000 MG tablet Take 1,000 mg by mouth daily.     [name] aspirin 81 MG tablet Take 81 mg by mouth daily.     . Calcium Carbonate (CALCIUM 600 PO) Take 1 capsule by mouth daily.     . Cholecalciferol (VITAMIN D) 2000 UNITS CAPS Take 1 capsule by mouth daily.     . furosemide (LASIX) 20 MG tablet Take 1 tablet  (20 mg total) by mouth daily. 30 tablet 5  . losartan (COZAAR) 100 MG tablet take 1 tablet by mouth once daily 30 tablet 5  . meclizine (ANTIVERT) 25 MG tablet take 1/2 to 1 tablet by mouth once daily 30 tablet  0  . Multiple Vitamin (MULTIVITAMIN WITH MINERALS) TABS Take 1 tablet by mouth daily.    . Omega-3 Fatty Acids (FISH OIL) 1000 MG CAPS Take 1 capsule by mouth daily.     . polyethylene glycol (MIRALAX / GLYCOLAX) packet Take 17 g by mouth daily. 10 each 0  . pregabalin (LYRICA) 100 MG capsule Take 1 capsule (100 mg total) by mouth at bedtime. 90 capsule 1  . pregabalin (LYRICA) 25 MG capsule Take 1 capsule (25 mg total) by mouth 3 (three) times daily. 270 capsule 1  . traMADol (ULTRAM) 50 MG tablet Take 50 mg by mouth every 6 (six) hours as needed.     No current facility-administered medications on file prior to visit.   She is allergic to codeine; duloxetine; and sertraline hcl.  Review of Systems  Review of Systems  Constitutional: Negative for activity change, appetite change and fatigue.  HENT: Negative for hearing loss, congestion, tinnitus and ear discharge.   Eyes: Negative for visual disturbance (see optho q1y -- vision corrected to 20/20 with glasses).  Respiratory: Negative for cough, chest tightness and shortness of breath.   Cardiovascular: Negative for chest pain, palpitations and leg swelling.  Gastrointestinal: Negative for abdominal pain, diarrhea, constipation and abdominal distention.  Genitourinary: Negative for urgency, frequency, decreased urine volume and difficulty urinating.  Musculoskeletal: Negative for back pain, arthralgias and gait problem.  Skin: Negative for color change, pallor and rash.  Neurological: Negative for dizziness, light-headedness, numbness and headaches.  Hematological: Negative for adenopathy. Does not bruise/bleed easily.  Psychiatric/Behavioral: Negative for suicidal ideas, confusion, sleep disturbance, self-injury, dysphoric mood,  decreased concentration and agitation.  Pt is able to read and write and can do all ADLs No risk for falling No abuse/ violence in home     Objective:     Vision by Snellen chart: opth Body mass index is 29.8 kg/(m^2). BP 132/70 mmHg  Pulse 72  Temp(Src) 98.1 F (36.7 C) (Oral)  Ht 5' 4.75" (1.645 m)  Wt 177 lb 12.8 oz (80.65 kg)  BMI 29.80 kg/m2  SpO2 97%  BP 132/70 mmHg  Pulse 72  Temp(Src) 98.1 F (36.7 C) (Oral)  Ht 5' 4.75" (1.645 m)  Wt 177 lb 12.8 oz (80.65 kg)  BMI 29.80 kg/m2  SpO2 97% General appearance: alert, cooperative, appears stated age and no distress Head: Normocephalic, without obvious abnormality, atraumatic Eyes: conjunctivae/corneas clear. PERRL, EOM's intact. Fundi benign. Ears: normal TM's and external ear canals both ears Nose: Nares normal. Septum midline. Mucosa normal. No drainage or sinus tenderness. Throat: lips, mucosa, and tongue normal; teeth and gums normal Neck: no adenopathy, no carotid bruit, no JVD, supple, symmetrical, trachea midline and thyroid not enlarged, symmetric, no tenderness/mass/nodules Back: symmetric, no curvature. ROM normal. No CVA tenderness. Lungs: clear to auscultation bilaterally Breasts: normal appearance, no masses or tenderness Heart: regular rate and rhythm, S1, S2 normal, no murmur, click, rub or gallop Abdomen: soft, non-tender; bowel sounds normal; no masses,  no organomegaly Pelvic: deferred Extremities: extremities normal, atraumatic, no cyanosis or edema Pulses: 2+ and symmetric Skin: Skin color, texture, turgor normal. No rashes or lesions Lymph nodes: Cervical, supraclavicular, and axillary nodes normal. Neurologic: Alert and oriented X 3, normal strength and tone. Normal symmetric reflexes. Normal coordination and gait Psych- no depression, no anxiety      Assessment:     cpe     Plan:  .  ghm utd Check labs During the course of the visit the patient was educated  and counseled about  appropriate screenin    Pneumococcal vaccine   Influenza vaccine  Screening mammography  Bone densitometry screening  Advanced directives: has an advanced directive - a copy HAS NOT been provided.  Diet review for nutrition referral? Yes ____  Not Indicated ___x_   Patient Instructions (the written plan) was given to the patient.  Medicare Attestation I have personally reviewed: The patient's medical and social history Their use of alcohol, tobacco or illicit drugs Their current medications and supplements The patient's functional ability including ADLs,fall risks, home safety risks, cognitive, and hearing and visual impairment Diet and physical activities Evidence for depression or mood disorders  The patient's weight, height, BMI, and visual acuity have been recorded in the chart.  I have made referrals, counseling, and provided education to the patient based on review of the above and I have provided the patient with a written personalized care plan for preventive services.    1. Anxiety disorder, unspecified anxiety disorder type   - ALPRAZolam (XANAX) 0.25 MG tablet; 1 every 8-12 hrs prn , not routinely  Dispense: 30 tablet; Refill: 0  2. Osteopenia   - DG Bone Density; Future  3. Essential hypertension stable - Basic metabolic panel - CBC with Differential - Hepatic function panel - Lipid panel - POCT urinalysis dipstick  4. Preventative health care  - Fecal occult blood, imunochemical; Future  Garnet Koyanagi, DO   04/19/2014

## 2014-04-19 NOTE — Progress Notes (Signed)
Pre visit review using our clinic review tool, if applicable. No additional management support is needed unless otherwise documented below in the visit note. 

## 2014-05-06 ENCOUNTER — Ambulatory Visit (INDEPENDENT_AMBULATORY_CARE_PROVIDER_SITE_OTHER)
Admission: RE | Admit: 2014-05-06 | Discharge: 2014-05-06 | Disposition: A | Discharge status: Home / Self Care | Payer: Medicare Other | Source: Ambulatory Visit | Attending: Family Medicine | Admitting: Family Medicine

## 2014-05-06 DIAGNOSIS — M859 Disorder of bone density and structure, unspecified: Secondary | ICD-10-CM | POA: Diagnosis not present

## 2014-05-06 NOTE — Addendum Note (Signed)
Addended by: Kelle Darting A on: 05/06/2014 09:28 AM   Modules accepted: Orders

## 2014-05-15 ENCOUNTER — Other Ambulatory Visit: Payer: Self-pay

## 2014-05-15 MED ORDER — ALENDRONATE SODIUM 70 MG PO TABS: 70.0000 mg | ORAL_TABLET | ORAL | Status: DC

## 2014-05-20 ENCOUNTER — Telehealth: Payer: Self-pay

## 2014-05-20 NOTE — Telephone Encounter (Signed)
Notes Recorded by Rosalita Chessman, DO on 05/13/2014 at 8:59 AM + osteoporosis--- con't calcium and vita d con't weight bearing exercise Add fosamax 70 mg #4 1 weekly , 11 refills  Recheck 2 years   Spoke with patient and she took Fosamax in the past for 5 years and was told she by 3 doctors to come off of it. She said it did not do her any good and she would rather not take anything. I advised I would make MD aware.      KP

## 2014-05-21 NOTE — Telephone Encounter (Signed)
She has agreed to take the calcium and vitamin D.     KP

## 2014-05-21 NOTE — Telephone Encounter (Signed)
Will she at least take ca 1200 mg daily and vita D3  1000u

## 2014-06-21 ENCOUNTER — Encounter: Payer: Self-pay | Admitting: Neurology

## 2014-06-21 ENCOUNTER — Ambulatory Visit (INDEPENDENT_AMBULATORY_CARE_PROVIDER_SITE_OTHER): Payer: Medicare Other | Admitting: Neurology

## 2014-06-21 VITALS — BP 134/63 | HR 64 | Ht 65.0 in | Wt 182.2 lb

## 2014-06-21 DIAGNOSIS — G609 Hereditary and idiopathic neuropathy, unspecified: Secondary | ICD-10-CM | POA: Diagnosis not present

## 2014-06-21 DIAGNOSIS — M545 Low back pain, unspecified: Secondary | ICD-10-CM

## 2014-06-21 DIAGNOSIS — G8929 Other chronic pain: Secondary | ICD-10-CM | POA: Diagnosis not present

## 2014-06-21 HISTORY — DX: Low back pain, unspecified: M54.50

## 2014-06-21 MED ORDER — GABAPENTIN 100 MG PO CAPS: 100.0000 mg | ORAL_CAPSULE | Freq: Three times a day (TID) | ORAL | Status: DC

## 2014-06-21 MED ORDER — GABAPENTIN 300 MG PO CAPS: 600.0000 mg | ORAL_CAPSULE | Freq: Every day | ORAL | Status: DC

## 2014-06-21 NOTE — Progress Notes (Signed)
Reason for visit: Peripheral neuropathy  Krystal Henderson is an 81 y.o. female  History of present illness:  Ms. Kiester is an 81 year old right-handed white female with a history of what is felt to be a small fiber peripheral neuropathy. The patient has had chronic issues with burning sensations in the feet, going up the distal one third of the legs bilaterally. The patient is sleeping well at night on Lyrica taking 100 mg at night, but she is having ongoing issues with burning and dysesthesias during the daytime. The patient takes Lyrica 25 mg 3 times daily. She is finding that the financial burden of this medication is becoming too great. She wishes to switch off the medication to gabapentin. The patient does have a history of low back pain, she has had prior lumbosacral spine surgery. She also has some issues with the right hip, and she is followed through orthopedic surgery for this issue. She returns to this office for further evaluation. She denies any significant problems with gait instability, and no falls.  Past Medical History  Diagnosis Date  . HTN (hypertension)   . Hyperlipemia 2006    LDL 130  . Esophageal reflux     inactive  . Anxiety     PMH of  . Hypoglycemia     reactive  . Peripheral neuropathy   . Vitamin B12 deficiency   . Personal history of colonic polyps     adenomatous  . Diverticulosis of colon (without mention of hemorrhage)   . DJD (degenerative joint disease)   . Angiodysplasia 2007    @ colonoscopy  . Pneumonia 2012    hx of pneumonia   . Anemia     hx of   . Pelvic fracture 12/30/2011    [facility]  . Chronic low back pain 06/21/2014    Past Surgical History  Procedure Laterality Date  . Tear duct probing      X 2  . Total hip arthroplasty  2000    right  . Facial cosmetic surgery    . Tubal ligation    . Hemorroidectomy    . Dilation and curettage of uterus    . Colonoscopy w/ polypectomy      X 2 , Dr  Verl Blalock;  angiodysplasia. Due 2022  . Lumbar laminectomy/decompression microdiscectomy  09/10/2011    Procedure: LUMBAR LAMINECTOMY/DECOMPRESSION MICRODISCECTOMY;  Surgeon: Johnn Hai, MD;  Location: [facility];  Service: Orthopedics;  Laterality: N/A;  decompression laminectomy L2-3, L3-4, L4-5  . Orif wrist fracture  01/11/2012    Procedure: OPEN REDUCTION INTERNAL FIXATION (ORIF) WRIST FRACTURE;  Surgeon: Tennis Must, MD;  Location: [facility];  Service: Orthopedics;  Laterality: Right;  . Cataract surgery  12-26-12 and 01-09-13  . Colonoscopy  2011    neg    Family History  Problem Relation Age of Onset  . Colon cancer Neg Hx   . Diabetes Neg Hx   . Heart attack Father 24  . Kidney disease Mother     renal failure  . Throat cancer Sister     smoker  . Osteoporosis Sister   . Heart attack Brother 51  . Stroke Brother 29    smoker  . Depression Maternal Uncle   . Cancer Brother     X3  lung cancer, all smokers  . Peripheral vascular disease Daughter     Social history:  reports that she has never smoked. She has never used smokeless tobacco.  She reports that she drinks alcohol. She reports that she does not use illicit drugs.    Allergies  Allergen Reactions  . Codeine     REACTION: nausea  . Duloxetine     REACTION: intolerance  . Sertraline Hcl     REACTION: intolerance    Medications:  Prior to Admission medications   Medication Sig Start Date End Date Taking? Authorizing Provider  ALPRAZolam Duanne Moron) 0.25 MG tablet 1 every 8-12 hrs prn , not routinely 04/19/14  Yes Rosalita Chessman, DO  amLODipine (NORVASC) 5 MG tablet take 1 tablet by mouth once daily 03/04/14  Yes Rosalita Chessman, DO  Ascorbic Acid (VITAMIN C) 1000 MG tablet Take 1,000 mg by mouth daily.    Yes Historical Provider, MD  aspirin 81 MG tablet Take 81 mg by mouth daily.    Yes Historical Provider, MD  Calcium Carbonate (CALCIUM 600 PO) Take 1 capsule by mouth daily.    Yes Historical Provider, MD    Cholecalciferol (VITAMIN D) 2000 UNITS CAPS Take 1 capsule by mouth daily.    Yes Historical Provider, MD  furosemide (LASIX) 20 MG tablet Take 1 tablet (20 mg total) by mouth daily. 03/04/14  Yes Rosalita Chessman, DO  losartan (COZAAR) 100 MG tablet take 1 tablet by mouth once daily 03/27/14  Yes Rosalita Chessman, DO  meclizine (ANTIVERT) 25 MG tablet take 1/2 to 1 tablet by mouth once daily Patient taking differently: take 1/2 to 1 tablet by mouth as needed 11/19/13  Yes [name], MD  Multiple Vitamin (MULTIVITAMIN WITH MINERALS) TABS Take 1 tablet by mouth daily.   Yes Historical Provider, MD  Omega-3 Fatty Acids (FISH OIL) 1000 MG CAPS Take 1 capsule by mouth daily.    Yes Historical Provider, MD  polyethylene glycol (MIRALAX / GLYCOLAX) packet Take 17 g by mouth daily. 01/03/12  Yes Nishant Dhungel, MD  traMADol (ULTRAM) 50 MG tablet Take 50 mg by mouth every 6 (six) hours as needed.   Yes Historical Provider, MD  gabapentin (NEURONTIN) 100 MG capsule Take 1 capsule (100 mg total) by mouth 3 (three) times daily. 06/21/14   Kathrynn Ducking, MD  gabapentin (NEURONTIN) 300 MG capsule Take 2 capsules (600 mg total) by mouth at bedtime. 06/21/14   Kathrynn Ducking, MD    ROS:  Out of a complete 14 system review of symptoms, the patient complains only of the following symptoms, and all other reviewed systems are negative.  Loss of vision, blurred vision Back pain, walking difficulty  Blood pressure 134/63, pulse 64, height 5\' 5"  (1.651 m), weight 182 lb 3.2 oz (82.645 kg).  Physical Exam  General: The patient is alert and cooperative at the time of the examination.  Skin: No significant peripheral edema is noted.   Neurologic Exam  Mental status: The patient is alert and oriented x 3 at the time of the examination. The patient has apparent normal recent and remote memory, with an apparently normal attention span and concentration ability.   Cranial nerves: Facial symmetry is  present. Speech is normal, no aphasia or dysarthria is noted. Extraocular movements are full. Visual fields are full.  Motor: The patient has good strength in all 4 extremities.  Sensory examination: Soft touch sensation is symmetric on the face, arms, and legs.  Coordination: The patient has good finger-nose-finger and heel-to-shin bilaterally.  Gait and station: The patient has a normal gait. Tandem gait is slightly unsteady. Romberg is negative. No drift  is seen.  Reflexes: Deep tendon reflexes are symmetric, somewhat brisk throughout.   Assessment/Plan:  1. Small fiber peripheral neuropathy  2. Chronic low back pain  The patient cannot afford the Lyrica at this time. We will switch to gabapentin, taking 100 mg 3 times daily during the daytime, and 600 mg at night. She will call me if this dosing is not adequate for her. She will go off of the Lyrica. She will follow-up in 6 months, sooner if needed.  Jill Alexanders MD 06/21/2014 3:42 PM  [facility] [address] [place], [place] [address]  Phone [phone] Fax [phone]

## 2014-06-21 NOTE — Patient Instructions (Signed)

## 2014-07-11 ENCOUNTER — Telehealth: Payer: Self-pay | Admitting: Neurology

## 2014-07-11 NOTE — Telephone Encounter (Signed)
I called patient. She is not tolerating the higher dose of gabapentin. She will cut back on the gabapentin taking 100 mg twice during the day, and take 400 mg of gabapentin in the evening, not 600 mg. She will contact me if she is still having problems.

## 2014-07-11 NOTE — Telephone Encounter (Signed)
Patient is calling because the gabapentin (NEURONTIN) 300 MG capsule is causing the patient to be nauseated and having no energy so she cut back and only takes 1 pill at bedtime. Patient is still taking gabapentin (NEURONTIN) 100 MG capsule 3 times a day. Please call patient and discuss. Thank you.

## 2014-08-02 DIAGNOSIS — Z96641 Presence of right artificial hip joint: Secondary | ICD-10-CM | POA: Diagnosis not present

## 2014-08-02 DIAGNOSIS — Z471 Aftercare following joint replacement surgery: Secondary | ICD-10-CM | POA: Diagnosis not present

## 2014-08-12 ENCOUNTER — Telehealth: Payer: Self-pay

## 2014-08-12 ENCOUNTER — Telehealth: Payer: Self-pay | Admitting: Neurology

## 2014-08-12 DIAGNOSIS — G609 Hereditary and idiopathic neuropathy, unspecified: Secondary | ICD-10-CM

## 2014-08-12 MED ORDER — PREGABALIN 100 MG PO CAPS: 100.0000 mg | ORAL_CAPSULE | Freq: Every day | ORAL | Status: DC

## 2014-08-12 MED ORDER — PREGABALIN 25 MG PO CAPS: 25.0000 mg | ORAL_CAPSULE | Freq: Three times a day (TID) | ORAL | Status: DC

## 2014-08-12 NOTE — Telephone Encounter (Signed)
Patient called stating that she can no longer take the GABAPENTIN  Because it makes her sick and she would like to go back to [facility]. Please call and advice# [phone]

## 2014-08-12 NOTE — Telephone Encounter (Signed)
Rx ready for pick up. 

## 2014-08-12 NOTE — Telephone Encounter (Signed)
I called patient. She cannot tolerate the gabapentin, it is not working for her. The medication makes her nauseated. I'll switch her back to Lyrica, taking 100 mg at night, 25 mg 3 times during the day.

## 2014-08-20 ENCOUNTER — Encounter: Payer: Self-pay | Admitting: Gastroenterology

## 2014-08-22 DIAGNOSIS — H04123 Dry eye syndrome of bilateral lacrimal glands: Secondary | ICD-10-CM | POA: Diagnosis not present

## 2014-08-22 DIAGNOSIS — H31012 Macula scars of posterior pole (postinflammatory) (post-traumatic), left eye: Secondary | ICD-10-CM | POA: Diagnosis not present

## 2014-08-22 DIAGNOSIS — H26493 Other secondary cataract, bilateral: Secondary | ICD-10-CM | POA: Diagnosis not present

## 2014-08-22 DIAGNOSIS — Z961 Presence of intraocular lens: Secondary | ICD-10-CM | POA: Diagnosis not present

## 2014-08-28 DIAGNOSIS — Z6832 Body mass index (BMI) 32.0-32.9, adult: Secondary | ICD-10-CM | POA: Diagnosis not present

## 2014-08-28 DIAGNOSIS — M4806 Spinal stenosis, lumbar region: Secondary | ICD-10-CM | POA: Diagnosis not present

## 2014-08-28 DIAGNOSIS — M545 Low back pain: Secondary | ICD-10-CM | POA: Diagnosis not present

## 2014-08-29 DIAGNOSIS — M4808 Spinal stenosis, sacral and sacrococcygeal region: Secondary | ICD-10-CM | POA: Diagnosis not present

## 2014-08-30 ENCOUNTER — Telehealth: Payer: Self-pay | Admitting: Family Medicine

## 2014-08-30 DIAGNOSIS — E785 Hyperlipidemia, unspecified: Secondary | ICD-10-CM

## 2014-08-30 MED ORDER — AMLODIPINE BESYLATE 5 MG PO TABS: ORAL_TABLET | ORAL | Status: DC

## 2014-08-30 MED ORDER — FUROSEMIDE 20 MG PO TABS: 20.0000 mg | ORAL_TABLET | Freq: Every day | ORAL | Status: DC

## 2014-08-30 NOTE — Telephone Encounter (Signed)
Recheck labs in 3 months---lipid, hep. Please schedule labs.      KP

## 2014-08-30 NOTE — Telephone Encounter (Signed)
Relation to pt: self  Call back number: [phone] Pharmacy: [facility] [place]  Reason for call:  Pt requesting a refill amLODipine (NORVASC) 5 MG tablet & furosemide (LASIX) 20 MG tablet

## 2014-09-04 DIAGNOSIS — M4806 Spinal stenosis, lumbar region: Secondary | ICD-10-CM | POA: Diagnosis not present

## 2014-09-04 DIAGNOSIS — M5126 Other intervertebral disc displacement, lumbar region: Secondary | ICD-10-CM | POA: Diagnosis not present

## 2014-09-04 DIAGNOSIS — M9973 Connective tissue and disc stenosis of intervertebral foramina of lumbar region: Secondary | ICD-10-CM | POA: Diagnosis not present

## 2014-09-04 DIAGNOSIS — M47816 Spondylosis without myelopathy or radiculopathy, lumbar region: Secondary | ICD-10-CM | POA: Diagnosis not present

## 2014-09-12 DIAGNOSIS — M545 Low back pain: Secondary | ICD-10-CM | POA: Diagnosis not present

## 2014-09-12 DIAGNOSIS — Z6831 Body mass index (BMI) 31.0-31.9, adult: Secondary | ICD-10-CM | POA: Diagnosis not present

## 2014-09-12 DIAGNOSIS — I10 Essential (primary) hypertension: Secondary | ICD-10-CM | POA: Diagnosis not present

## 2014-09-23 ENCOUNTER — Other Ambulatory Visit: Payer: Self-pay

## 2014-09-23 MED ORDER — LOSARTAN POTASSIUM 100 MG PO TABS: ORAL_TABLET | ORAL | Status: DC

## 2014-10-03 DIAGNOSIS — N2581 Secondary hyperparathyroidism of renal origin: Secondary | ICD-10-CM | POA: Diagnosis not present

## 2014-10-03 DIAGNOSIS — D631 Anemia in chronic kidney disease: Secondary | ICD-10-CM | POA: Diagnosis not present

## 2014-10-03 DIAGNOSIS — N183 Chronic kidney disease, stage 3 (moderate): Secondary | ICD-10-CM | POA: Diagnosis not present

## 2014-10-15 DIAGNOSIS — M5416 Radiculopathy, lumbar region: Secondary | ICD-10-CM | POA: Diagnosis not present

## 2014-10-18 ENCOUNTER — Ambulatory Visit (INDEPENDENT_AMBULATORY_CARE_PROVIDER_SITE_OTHER): Payer: Medicare Other | Admitting: Family Medicine

## 2014-10-18 ENCOUNTER — Encounter: Payer: Self-pay | Admitting: Family Medicine

## 2014-10-18 VITALS — BP 122/70 | HR 67 | Temp 98.4°F | Ht 65.0 in | Wt 180.4 lb

## 2014-10-18 DIAGNOSIS — R42 Dizziness and giddiness: Secondary | ICD-10-CM

## 2014-10-18 DIAGNOSIS — F419 Anxiety disorder, unspecified: Secondary | ICD-10-CM | POA: Diagnosis not present

## 2014-10-18 DIAGNOSIS — I10 Essential (primary) hypertension: Secondary | ICD-10-CM

## 2014-10-18 DIAGNOSIS — E785 Hyperlipidemia, unspecified: Secondary | ICD-10-CM | POA: Diagnosis not present

## 2014-10-18 LAB — POCT URINALYSIS DIPSTICK
Bilirubin, UA: NEGATIVE
Glucose, UA: NEGATIVE
Ketones, UA: NEGATIVE
LEUKOCYTES UA: NEGATIVE
NITRITE UA: NEGATIVE
PH UA: 5
PROTEIN UA: NEGATIVE
RBC UA: NEGATIVE
Spec Grav, UA: 1.02
Urobilinogen, UA: 0.2

## 2014-10-18 LAB — BASIC METABOLIC PANEL
BUN: 34 mg/dL — ABNORMAL HIGH (ref 6–23)
CALCIUM: 9.5 mg/dL (ref 8.4–10.5)
CO2: 25 mEq/L (ref 19–32)
CREATININE: 1.8 mg/dL — AB (ref 0.40–1.20)
Chloride: 103 mEq/L (ref 96–112)
GFR: 28.75 mL/min — AB (ref >60.00)
GLUCOSE: 79 mg/dL (ref 70–99)
Potassium: 4.1 mEq/L (ref 3.5–5.1)
SODIUM: 137 meq/L (ref 135–145)

## 2014-10-18 LAB — HEPATIC FUNCTION PANEL
ALT: 18 U/L (ref 0–35)
AST: 20 U/L (ref 0–37)
Albumin: 3.9 g/dL (ref 3.5–5.2)
Alkaline Phosphatase: 55 U/L (ref 39–117)
BILIRUBIN DIRECT: 0.1 mg/dL (ref 0.0–0.3)
Total Bilirubin: 0.5 mg/dL (ref 0.2–1.2)
Total Protein: 7.3 g/dL (ref 6.0–8.3)

## 2014-10-18 LAB — LIPID PANEL
CHOL/HDL RATIO: 3
Cholesterol: 234 mg/dL — ABNORMAL HIGH (ref 0–200)
HDL: 69.9 mg/dL (ref >39.00)
NonHDL: 164.1
Triglycerides: 244 mg/dL — ABNORMAL HIGH (ref 0.0–149.0)
VLDL: 48.8 mg/dL — ABNORMAL HIGH (ref 0.0–40.0)

## 2014-10-18 LAB — LDL CHOLESTEROL, DIRECT: LDL DIRECT: 129 mg/dL

## 2014-10-18 MED ORDER — ALPRAZOLAM 0.25 MG PO TABS: ORAL_TABLET | ORAL | Status: DC

## 2014-10-18 MED ORDER — MECLIZINE HCL 25 MG PO TABS
ORAL_TABLET | ORAL | Status: DC
Start: 2014-10-18 — End: 2019-07-02

## 2014-10-18 NOTE — Patient Instructions (Signed)

## 2014-10-18 NOTE — Progress Notes (Signed)
Pre visit review using our clinic review tool, if applicable. No additional management support is needed unless otherwise documented below in the visit note. 

## 2014-10-18 NOTE — Progress Notes (Signed)
Subjective:    Patient ID: Krystal Henderson, female    DOB: December 19, 1932, 81 y.o.   MRN: [id number]  HPI  Patient here for f/u htn.  No cp, no sob, no palpitations.   Pt is also c/o fatigue that is affecting her daily life.    Past Medical History  Diagnosis Date  . HTN (hypertension)   . Hyperlipemia 2006    LDL 130  . Esophageal reflux     inactive  . Anxiety     PMH of  . Hypoglycemia     reactive  . Peripheral neuropathy   . Vitamin B12 deficiency   . Personal history of colonic polyps     adenomatous  . Diverticulosis of colon (without mention of hemorrhage)   . DJD (degenerative joint disease)   . Angiodysplasia 2007    @ colonoscopy  . Pneumonia 2012    hx of pneumonia   . Anemia     hx of   . Pelvic fracture 12/30/2011    [facility]  . Chronic low back pain 06/21/2014    Review of Systems  Constitutional: Negative for diaphoresis, appetite change, fatigue and unexpected weight change.  Eyes: Negative for pain, redness and visual disturbance.  Respiratory: Negative for cough, chest tightness, shortness of breath and wheezing.   Cardiovascular: Negative for chest pain, palpitations and leg swelling.  Endocrine: Negative for cold intolerance, heat intolerance, polydipsia, polyphagia and polyuria.  Genitourinary: Negative for dysuria, frequency and difficulty urinating.  Neurological: Negative for dizziness, light-headedness, numbness and headaches.    Current Outpatient Prescriptions on File Prior to Visit  Medication Sig Dispense Refill  . amLODipine (NORVASC) 5 MG tablet take 1 tablet by mouth once daily 30 tablet 5  . Ascorbic Acid (VITAMIN C) 1000 MG tablet Take 1,000 mg by mouth daily.     [name] aspirin 81 MG tablet Take 81 mg by mouth daily.     . Calcium Carbonate (CALCIUM 600 PO) Take 1 capsule by mouth daily.     . Cholecalciferol (VITAMIN D) 2000 UNITS CAPS Take 1 capsule by mouth daily.     . furosemide (LASIX) 20 MG tablet Take 1 tablet (20 mg  total) by mouth daily. 30 tablet 5  . losartan (COZAAR) 100 MG tablet take 1 tablet by mouth once daily. Office visit due now 30 tablet 2  . Multiple Vitamin (MULTIVITAMIN WITH MINERALS) TABS Take 1 tablet by mouth daily.    . Omega-3 Fatty Acids (FISH OIL) 1000 MG CAPS Take 1 capsule by mouth daily.     . polyethylene glycol (MIRALAX / GLYCOLAX) packet Take 17 g by mouth daily. 10 each 0  . pregabalin (LYRICA) 100 MG capsule Take 1 capsule (100 mg total) by mouth at bedtime. 30 capsule 5  . pregabalin (LYRICA) 25 MG capsule Take 1 capsule (25 mg total) by mouth 3 (three) times daily. 90 capsule 5  . traMADol (ULTRAM) 50 MG tablet Take 50 mg by mouth every 6 (six) hours as needed.     No current facility-administered medications on file prior to visit.       Objective:    Physical Exam  Constitutional: She is oriented to person, place, and time. She appears well-developed and well-nourished.  HENT:  Head: Normocephalic and atraumatic.  Eyes: Conjunctivae and EOM are normal.  Neck: Normal range of motion. Neck supple. No JVD present. Carotid bruit is not present. No thyromegaly present.  Cardiovascular: Normal rate, regular rhythm and normal heart  sounds.   No murmur heard. Pulmonary/Chest: Effort normal and breath sounds normal. No respiratory distress. She has no wheezes. She has no rales. She exhibits no tenderness.  Musculoskeletal: She exhibits no edema.  Neurological: She is alert and oriented to person, place, and time.  Psychiatric: She has a normal mood and affect.    BP 122/70 mmHg  Pulse 67  Temp(Src) 98.4 F (36.9 C) (Oral)  Ht 5\' 5"  (1.651 m)  Wt 180 lb 6.4 oz (81.829 kg)  BMI 30.02 kg/m2  SpO2 98% Wt Readings from Last 3 Encounters:  10/18/14 180 lb 6.4 oz (81.829 kg)  06/21/14 182 lb 3.2 oz (82.645 kg)  04/19/14 177 lb 12.8 oz (80.65 kg)     Lab Results  Component Value Date   WBC 5.9 04/19/2014   HGB 13.2 04/19/2014   HCT 40.6 04/19/2014   PLT 287.0  04/19/2014   GLUCOSE 79 10/18/2014   CHOL 234* 10/18/2014   TRIG 244.0* 10/18/2014   HDL 69.90 10/18/2014   LDLDIRECT 129.0 10/18/2014   LDLCALC 130* 04/19/2014   ALT 18 10/18/2014   AST 20 10/18/2014   NA 137 10/18/2014   K 4.1 10/18/2014   CL 103 10/18/2014   CREATININE 1.80* 10/18/2014   BUN 34* 10/18/2014   CO2 25 10/18/2014   TSH 3.53 11/26/2013   INR 1.3 ratio* 10/29/2009   HGBA1C 5.6 08/24/2011       Assessment & Plan:   Problem List Items Addressed This Visit    Essential hypertension    con't norvasc and lasix rto 6 months or sooner prn      Relevant Orders   Basic metabolic panel (Completed)   POCT urinalysis dipstick (Completed)    Other Visit Diagnoses    Dizzy    -  Primary    Relevant Medications    meclizine (ANTIVERT) 25 MG tablet    Anxiety disorder, unspecified anxiety disorder type        Relevant Medications    ALPRAZolam (XANAX) 0.25 MG tablet    Hyperlipidemia        Relevant Orders    Hepatic function panel (Completed)    Lipid panel (Completed)    POCT urinalysis dipstick (Completed)       I am having [name] maintain her aspirin, Calcium Carbonate (CALCIUM 600 PO), Fish Oil, vitamin C, Vitamin D, multivitamin with minerals, polyethylene glycol, traMADol, pregabalin, pregabalin, amLODipine, furosemide, losartan, meclizine, and ALPRAZolam.  Meds ordered this encounter  Medications  . meclizine (ANTIVERT) 25 MG tablet    Sig: take 1/2 to 1 tablet by mouth once daily    Dispense:  30 tablet    Refill:  0    Regular use can increase risk by affecting  level of alertness and balance with increased risk of falli  . ALPRAZolam (XANAX) 0.25 MG tablet    Sig: 1 every 8-12 hrs prn , not routinely    Dispense:  30 tablet    Refill:  0     Garnet Koyanagi, DO

## 2014-10-18 NOTE — Assessment & Plan Note (Signed)
con't norvasc and lasix rto 6 months or sooner prn

## 2014-10-30 DIAGNOSIS — L570 Actinic keratosis: Secondary | ICD-10-CM | POA: Diagnosis not present

## 2014-10-30 DIAGNOSIS — L820 Inflamed seborrheic keratosis: Secondary | ICD-10-CM | POA: Diagnosis not present

## 2014-12-19 ENCOUNTER — Encounter: Payer: Self-pay | Admitting: Family Medicine

## 2014-12-24 ENCOUNTER — Ambulatory Visit (INDEPENDENT_AMBULATORY_CARE_PROVIDER_SITE_OTHER): Payer: Medicare Other | Admitting: Neurology

## 2014-12-24 ENCOUNTER — Ambulatory Visit: Payer: Medicare Other | Admitting: Adult Health

## 2014-12-24 ENCOUNTER — Encounter: Payer: Self-pay | Admitting: Neurology

## 2014-12-24 VITALS — BP 153/77 | HR 63 | Ht 65.0 in | Wt 183.5 lb

## 2014-12-24 DIAGNOSIS — G609 Hereditary and idiopathic neuropathy, unspecified: Secondary | ICD-10-CM | POA: Diagnosis not present

## 2014-12-24 MED ORDER — PREGABALIN 100 MG PO CAPS: 100.0000 mg | ORAL_CAPSULE | Freq: Every day | ORAL | Status: DC

## 2014-12-24 MED ORDER — PREGABALIN 25 MG PO CAPS: 25.0000 mg | ORAL_CAPSULE | Freq: Two times a day (BID) | ORAL | Status: DC

## 2014-12-24 NOTE — Progress Notes (Signed)
Reason for visit: Peripheral neuropathy  Krystal Henderson is an 81 y.o. female  History of present illness:  Krystal Henderson is a 81 year old right-handed white female with a history of low back pain, prior lumbosacral spine surgery. The patient also has what is felt to be a small fiber neuropathy. The patient has done fairly well on Lyrica, the use of gabapentin did not help as much. The patient takes 100 mg of Lyrica at night, 25 mg twice during the day. She has not had any falls, she indicates that her low back pain is much worse when she is partially flexed with cooking, cleaning the house, or doing the dishes. She otherwise does not have significant pain when walking or sitting. She is sleeping fairly well at night. She returns to this office for further evaluation.   Past Medical History  Diagnosis Date  . HTN (hypertension)   . Hyperlipemia 2006    LDL 130  . Esophageal reflux     inactive  . Anxiety     PMH of  . Hypoglycemia     reactive  . Peripheral neuropathy   . Vitamin B12 deficiency   . Personal history of colonic polyps     adenomatous  . Diverticulosis of colon (without mention of hemorrhage)   . DJD (degenerative joint disease)   . Angiodysplasia 2007    @ colonoscopy  . Pneumonia 2012    hx of pneumonia   . Anemia     hx of   . Pelvic fracture 12/30/2011    [facility]  . Chronic low back pain 06/21/2014    Past Surgical History  Procedure Laterality Date  . Tear duct probing      X 2  . Total hip arthroplasty  2000    right  . Facial cosmetic surgery    . Tubal ligation    . Hemorroidectomy    . Dilation and curettage of uterus    . Colonoscopy w/ polypectomy      X 2 , Dr  Verl Blalock; angiodysplasia. Due 2022  . Lumbar laminectomy/decompression microdiscectomy  09/10/2011    Procedure: LUMBAR LAMINECTOMY/DECOMPRESSION MICRODISCECTOMY;  Surgeon: Johnn Hai, MD;  Location: [facility];  Service: Orthopedics;  Laterality: N/A;  decompression  laminectomy L2-3, L3-4, L4-5  . Orif wrist fracture  01/11/2012    Procedure: OPEN REDUCTION INTERNAL FIXATION (ORIF) WRIST FRACTURE;  Surgeon: Tennis Must, MD;  Location: [facility];  Service: Orthopedics;  Laterality: Right;  . Cataract surgery  12-26-12 and 01-09-13  . Colonoscopy  2011    neg    Family History  Problem Relation Age of Onset  . Colon cancer Neg Hx   . Diabetes Neg Hx   . Heart attack Father 4  . Kidney disease Mother     renal failure  . Throat cancer Sister     smoker  . Osteoporosis Sister   . Heart attack Brother 54  . Stroke Brother 69    smoker  . Depression Maternal Uncle   . Cancer Brother     X3  lung cancer, all smokers  . Peripheral vascular disease Daughter     Social history:  reports that she has never smoked. She has never used smokeless tobacco. She reports that she drinks about 1.8 oz of alcohol per week. She reports that she does not use illicit drugs.    Allergies  Allergen Reactions  . Codeine     REACTION: nausea  .  Duloxetine     REACTION: intolerance  . Sertraline Hcl     REACTION: intolerance    Medications:  Prior to Admission medications   Medication Sig Start Date End Date Taking? Authorizing Provider  ALPRAZolam Duanne Moron) 0.25 MG tablet 1 every 8-12 hrs prn , not routinely 10/18/14  Yes Rosalita Chessman, DO  amLODipine (NORVASC) 5 MG tablet take 1 tablet by mouth once daily 08/30/14  Yes Rosalita Chessman, DO  Ascorbic Acid (VITAMIN C) 1000 MG tablet Take 1,000 mg by mouth daily.    Yes Historical Provider, MD  aspirin 81 MG tablet Take 81 mg by mouth daily.    Yes Historical Provider, MD  Calcium Carbonate (CALCIUM 600 PO) Take 1 capsule by mouth daily.    Yes Historical Provider, MD  Cholecalciferol (VITAMIN D) 2000 UNITS CAPS Take 1 capsule by mouth daily.    Yes Historical Provider, MD  furosemide (LASIX) 20 MG tablet Take 1 tablet (20 mg total) by mouth daily. 08/30/14  Yes Yvonne R Lowne, DO  losartan (COZAAR)  100 MG tablet take 1 tablet by mouth once daily. Office visit due now 09/23/14  Yes Rosalita Chessman, DO  meclizine (ANTIVERT) 25 MG tablet take 1/2 to 1 tablet by mouth once daily 10/18/14  Yes Rosalita Chessman, DO  Multiple Vitamin (MULTIVITAMIN WITH MINERALS) TABS Take 1 tablet by mouth daily.   Yes Historical Provider, MD  Omega-3 Fatty Acids (FISH OIL) 1000 MG CAPS Take 1 capsule by mouth daily.    Yes Historical Provider, MD  polyethylene glycol (MIRALAX / GLYCOLAX) packet Take 17 g by mouth daily. 01/03/12  Yes Nishant Dhungel, MD  pregabalin (LYRICA) 100 MG capsule Take 1 capsule (100 mg total) by mouth at bedtime. 12/24/14  Yes Kathrynn Ducking, MD  pregabalin (LYRICA) 25 MG capsule Take 1 capsule (25 mg total) by mouth 2 (two) times daily. 12/24/14  Yes Kathrynn Ducking, MD  traMADol (ULTRAM) 50 MG tablet Take 50 mg by mouth every 6 (six) hours as needed.   Yes Historical Provider, MD    ROS:  Out of a complete 14 system review of symptoms, the patient complains only of the following symptoms, and all other reviewed systems are negative.   Joint pain, back pain, walking difficulty  Blood pressure 153/77, pulse 63, height 5\' 5"  (1.651 m), weight 183 lb 8 oz (83.235 kg).  Physical Exam  General: The patient is alert and cooperative at the time of the examination.  Skin: No significant peripheral edema is noted.   Neurologic Exam  Mental status: The patient is alert and oriented x 3 at the time of the examination. The patient has apparent normal recent and remote memory, with an apparently normal attention span and concentration ability.   Cranial nerves: Facial symmetry is present. Speech is normal, no aphasia or dysarthria is noted. Extraocular movements are full. Visual fields are full.  Motor: The patient has good strength in all 4 extremities.  Sensory examination: Soft touch sensation is symmetric on the face, arms, and legs. There is a stocking pattern pinprick sensory deficit  one half way up the legs bilaterally.  Coordination: The patient has good finger-nose-finger and heel-to-shin bilaterally.  Gait and station: The patient has a normal gait. Tandem gait is slightly unsteady. Romberg is negative. No drift is seen.  Reflexes: Deep tendon reflexes are symmetric.   Assessment/Plan:    1. Peripheral neuropathy  2. Chronic low back pain  I have recommended potentially  using a lumbar support device for the back when she is doing housework or cooking. The patient does not wish to consider this at this time. We will continue the current dose of Lyrica, the patient will follow-up through this office in 6 or 7 months.  Jill Alexanders MD 12/24/2014 8:08 PM  [facility] [address] [place], [place] [address]  Phone [phone] Fax [phone]

## 2014-12-24 NOTE — Patient Instructions (Signed)

## 2014-12-26 ENCOUNTER — Other Ambulatory Visit: Payer: Self-pay | Admitting: Family Medicine

## 2014-12-26 ENCOUNTER — Other Ambulatory Visit: Payer: Self-pay | Admitting: Neurology

## 2015-01-02 DIAGNOSIS — M5136 Other intervertebral disc degeneration, lumbar region: Secondary | ICD-10-CM | POA: Diagnosis not present

## 2015-01-06 DIAGNOSIS — M5136 Other intervertebral disc degeneration, lumbar region: Secondary | ICD-10-CM | POA: Diagnosis not present

## 2015-01-14 DIAGNOSIS — M5136 Other intervertebral disc degeneration, lumbar region: Secondary | ICD-10-CM | POA: Diagnosis not present

## 2015-01-16 DIAGNOSIS — M5136 Other intervertebral disc degeneration, lumbar region: Secondary | ICD-10-CM | POA: Diagnosis not present

## 2015-01-21 DIAGNOSIS — M5136 Other intervertebral disc degeneration, lumbar region: Secondary | ICD-10-CM | POA: Diagnosis not present

## 2015-02-06 DIAGNOSIS — M545 Low back pain: Secondary | ICD-10-CM | POA: Diagnosis not present

## 2015-02-20 ENCOUNTER — Other Ambulatory Visit: Payer: Self-pay | Admitting: Family Medicine

## 2015-03-11 ENCOUNTER — Other Ambulatory Visit: Payer: Self-pay

## 2015-03-11 DIAGNOSIS — Z1231 Encounter for screening mammogram for malignant neoplasm of breast: Secondary | ICD-10-CM

## 2015-03-14 ENCOUNTER — Ambulatory Visit
Admission: RE | Admit: 2015-03-14 | Discharge: 2015-03-14 | Disposition: A | Discharge status: Home / Self Care | Payer: Medicare Other | Source: Ambulatory Visit

## 2015-03-14 DIAGNOSIS — Z1231 Encounter for screening mammogram for malignant neoplasm of breast: Secondary | ICD-10-CM

## 2015-03-26 ENCOUNTER — Other Ambulatory Visit: Payer: Self-pay | Admitting: Family Medicine

## 2015-04-21 ENCOUNTER — Encounter: Payer: Self-pay | Admitting: Family Medicine

## 2015-04-21 ENCOUNTER — Ambulatory Visit (INDEPENDENT_AMBULATORY_CARE_PROVIDER_SITE_OTHER): Payer: Medicare Other | Admitting: Family Medicine

## 2015-04-21 VITALS — BP 136/90 | HR 74 | Temp 98.0°F | Ht 65.0 in | Wt 183.4 lb

## 2015-04-21 DIAGNOSIS — E538 Deficiency of other specified B group vitamins: Secondary | ICD-10-CM | POA: Diagnosis not present

## 2015-04-21 DIAGNOSIS — R5382 Chronic fatigue, unspecified: Secondary | ICD-10-CM | POA: Diagnosis not present

## 2015-04-21 DIAGNOSIS — F419 Anxiety disorder, unspecified: Secondary | ICD-10-CM | POA: Diagnosis not present

## 2015-04-21 DIAGNOSIS — I10 Essential (primary) hypertension: Secondary | ICD-10-CM

## 2015-04-21 LAB — POCT URINALYSIS DIPSTICK
BILIRUBIN UA: NEGATIVE
Blood, UA: NEGATIVE
Glucose, UA: NEGATIVE
KETONES UA: NEGATIVE
LEUKOCYTES UA: NEGATIVE
NITRITE UA: NEGATIVE
PH UA: 6
PROTEIN UA: NEGATIVE
Spec Grav, UA: 1.015
Urobilinogen, UA: 0.2

## 2015-04-21 LAB — COMPREHENSIVE METABOLIC PANEL
ALBUMIN: 4.2 g/dL (ref 3.5–5.2)
ALK PHOS: 55 U/L (ref 39–117)
ALT: 14 U/L (ref 0–35)
AST: 16 U/L (ref 0–37)
BUN: 24 mg/dL — ABNORMAL HIGH (ref 6–23)
CALCIUM: 9.8 mg/dL (ref 8.4–10.5)
CHLORIDE: 103 meq/L (ref 96–112)
CO2: 26 mEq/L (ref 19–32)
Creatinine, Ser: 1.49 mg/dL — ABNORMAL HIGH (ref 0.40–1.20)
GFR: 35.71 mL/min — ABNORMAL LOW (ref >60.00)
Glucose, Bld: 80 mg/dL (ref 70–99)
POTASSIUM: 4.8 meq/L (ref 3.5–5.1)
Sodium: 140 mEq/L (ref 135–145)
TOTAL PROTEIN: 7.7 g/dL (ref 6.0–8.3)
Total Bilirubin: 0.5 mg/dL (ref 0.2–1.2)

## 2015-04-21 LAB — LIPID PANEL
CHOLESTEROL: 237 mg/dL — AB (ref 0–200)
HDL: 84.2 mg/dL (ref >39.00)
LDL CALC: 135 mg/dL — AB (ref 0–99)
NonHDL: 152.48
TRIGLYCERIDES: 87 mg/dL (ref 0.0–149.0)
Total CHOL/HDL Ratio: 3
VLDL: 17.4 mg/dL (ref 0.0–40.0)

## 2015-04-21 LAB — VITAMIN B12: Vitamin B-12: 367 pg/mL (ref 211–911)

## 2015-04-21 MED ORDER — LOSARTAN POTASSIUM 100 MG PO TABS: 100.0000 mg | ORAL_TABLET | Freq: Every day | ORAL | Status: DC

## 2015-04-21 MED ORDER — AMLODIPINE BESYLATE 5 MG PO TABS: 5.0000 mg | ORAL_TABLET | Freq: Every day | ORAL | Status: DC

## 2015-04-21 MED ORDER — ALPRAZOLAM 0.25 MG PO TABS: ORAL_TABLET | ORAL | Status: DC

## 2015-04-21 NOTE — Progress Notes (Signed)
Patient ID: Krystal Henderson, female    DOB: 06-30-1932  Age: 82 y.o. MRN: [id number]    Subjective:  Subjective HPI Krystal Henderson presents for bp f/u and labs.  No complaints.   Pt states her bp runs normal at home.  No cp, no sob, no ha.   Review of Systems  Constitutional: Positive for fatigue. Negative for activity change, appetite change and unexpected weight change.  Respiratory: Negative for cough and shortness of breath.   Cardiovascular: Negative for chest pain and palpitations.  Psychiatric/Behavioral: Negative for behavioral problems and dysphoric mood. The patient is not nervous/anxious.     History Past Medical History  Diagnosis Date  . HTN (hypertension)   . Hyperlipemia 2006    LDL 130  . Esophageal reflux     inactive  . Anxiety     PMH of  . Hypoglycemia     reactive  . Peripheral neuropathy ([place])   . Vitamin B12 deficiency   . Personal history of colonic polyps     adenomatous  . Diverticulosis of colon (without mention of hemorrhage)   . DJD (degenerative joint disease)   . Angiodysplasia 2007    @ colonoscopy  . Pneumonia 2012    hx of pneumonia   . Anemia     hx of   . Pelvic fracture ([place]) 12/30/2011    [facility]  . Chronic low back pain 06/21/2014    She has past surgical history that includes Tear duct probing; Total hip arthroplasty (2000); Facial cosmetic surgery; Tubal ligation; Hemorroidectomy; Dilation and curettage of uterus; Colonoscopy w/ polypectomy; Lumbar laminectomy/decompression microdiscectomy (09/10/2011); ORIF wrist fracture (01/11/2012); cataract surgery (12-26-12 and 01-09-13); and Colonoscopy (2011).   Her family history includes Cancer in her brother; Depression in her maternal uncle; Heart attack (age of onset: 23) in her brother; Heart attack (age of onset: 17) in her father; Kidney disease in her mother; Osteoporosis in her sister; Peripheral vascular disease in her daughter; Stroke (age of onset: 82) in her brother;  Throat cancer in her sister. There is no history of Colon cancer or Diabetes.She reports that she has never smoked. She has never used smokeless tobacco. She reports that she drinks about 1.8 oz of alcohol per week. She reports that she does not use illicit drugs.  Current Outpatient Prescriptions on File Prior to Visit  Medication Sig Dispense Refill  . Ascorbic Acid (VITAMIN C) 1000 MG tablet Take 1,000 mg by mouth daily.     [name] aspirin 81 MG tablet Take 81 mg by mouth daily.     . Calcium Carbonate (CALCIUM 600 PO) Take 1 capsule by mouth daily.     . Cholecalciferol (VITAMIN D) 2000 UNITS CAPS Take 1 capsule by mouth daily.     . furosemide (LASIX) 20 MG tablet take 1 tablet by mouth once daily 30 tablet 5  . meclizine (ANTIVERT) 25 MG tablet take 1/2 to 1 tablet by mouth once daily 30 tablet 0  . Multiple Vitamin (MULTIVITAMIN WITH MINERALS) TABS Take 1 tablet by mouth daily.    . Omega-3 Fatty Acids (FISH OIL) 1000 MG CAPS Take 1 capsule by mouth daily.     . polyethylene glycol (MIRALAX / GLYCOLAX) packet Take 17 g by mouth daily. 10 each 0  . pregabalin (LYRICA) 100 MG capsule Take 1 capsule (100 mg total) by mouth at bedtime. 30 capsule 5  . pregabalin (LYRICA) 25 MG capsule Take 1 capsule (25 mg total) by mouth 2 (  two) times daily. 60 capsule 5  . traMADol (ULTRAM) 50 MG tablet Take 50 mg by mouth every 6 (six) hours as needed.     No current facility-administered medications on file prior to visit.     Objective:  Objective Physical Exam  Constitutional: She is oriented to person, place, and time. She appears well-developed and well-nourished.  HENT:  Head: Normocephalic and atraumatic.  Eyes: Conjunctivae and EOM are normal.  Neck: Normal range of motion. Neck supple. No JVD present. Carotid bruit is not present. No thyromegaly present.  Cardiovascular: Normal rate, regular rhythm and normal heart sounds.   No murmur heard. Pulmonary/Chest: Effort normal and breath sounds  normal. No respiratory distress. She has no wheezes. She has no rales. She exhibits no tenderness.  Musculoskeletal: She exhibits no edema.  Neurological: She is alert and oriented to person, place, and time.  Psychiatric: She has a normal mood and affect. Her behavior is normal.  Nursing note and vitals reviewed.  BP 136/90 mmHg  Pulse 74  Temp(Src) 98 F (36.7 C) (Oral)  Ht '5\' 5"'  (1.651 m)  Wt 183 lb 6.4 oz (83.19 kg)  BMI 30.52 kg/m2  SpO2 96% Wt Readings from Last 3 Encounters:  04/21/15 183 lb 6.4 oz (83.19 kg)  12/24/14 183 lb 8 oz (83.235 kg)  10/18/14 180 lb 6.4 oz (81.829 kg)     Lab Results  Component Value Date   WBC 5.9 04/19/2014   HGB 13.2 04/19/2014   HCT 40.6 04/19/2014   PLT 287.0 04/19/2014   GLUCOSE 80 04/21/2015   CHOL 237* 04/21/2015   TRIG 87.0 04/21/2015   HDL 84.20 04/21/2015   LDLDIRECT 129.0 10/18/2014   LDLCALC 135* 04/21/2015   ALT 14 04/21/2015   AST 16 04/21/2015   NA 140 04/21/2015   K 4.8 04/21/2015   CL 103 04/21/2015   CREATININE 1.49* 04/21/2015   BUN 24* 04/21/2015   CO2 26 04/21/2015   TSH 3.53 11/26/2013   INR 1.3 ratio* 10/29/2009   HGBA1C 5.6 08/24/2011    Mm Digital Screening Bilateral  03/14/2015  CLINICAL DATA:  Screening. EXAM: DIGITAL SCREENING BILATERAL MAMMOGRAM WITH CAD COMPARISON:  Previous exam(s). ACR Breast Density Category b: There are scattered areas of fibroglandular density. FINDINGS: There are no findings suspicious for malignancy. Images were processed with CAD. IMPRESSION: No mammographic evidence of malignancy. A result letter of this screening mammogram will be mailed directly to the patient. RECOMMENDATION: Screening mammogram in one year. (Code:SM-B-01Y) BI-RADS CATEGORY  1: Negative. Electronically Signed   By: Everlean Alstrom M.D.   On: 03/14/2015 15:44     Assessment & Plan:  Plan I have changed Krystal Henderson's amLODipine and losartan. I am also having her maintain her aspirin, Calcium Carbonate  (CALCIUM 600 PO), Fish Oil, vitamin C, Vitamin D, multivitamin with minerals, polyethylene glycol, traMADol, meclizine, pregabalin, pregabalin, furosemide, and ALPRAZolam.  Meds ordered this encounter  Medications  . ALPRAZolam (XANAX) 0.25 MG tablet    Sig: 1 every 8-12 hrs prn , not routinely    Dispense:  30 tablet    Refill:  0  . amLODipine (NORVASC) 5 MG tablet    Sig: Take 1 tablet (5 mg total) by mouth daily.    Dispense:  30 tablet    Refill:  5  . losartan (COZAAR) 100 MG tablet    Sig: Take 1 tablet (100 mg total) by mouth daily.    Dispense:  30 tablet    Refill:  2  Problem List Items Addressed This Visit      Unprioritized   Essential hypertension    con't cozaar and norvasc bp runs better at home per pt Will hold off on changing meds---  Pt will check bp when she gets home and let us know what it is       Relevant Medications   amLODipine (NORVASC) 5 MG tablet   losartan (COZAAR) 100 MG tablet   Other Relevant Orders   Comp Met (CMET) (Completed)   Lipid panel (Completed)    Other Visit Diagnoses    Anxiety disorder, unspecified anxiety disorder type    -  Primary    Relevant Medications    ALPRAZolam (XANAX) 0.25 MG tablet    Chronic fatigue        Relevant Orders    Vitamin B12 (Completed)    POCT urinalysis dipstick (Completed)       Follow-up: Return in about 3 months (around 07/20/2015), or if symptoms worsen or fail to improve.  Garnet Koyanagi, DO

## 2015-04-21 NOTE — Progress Notes (Signed)
Pre visit review using our clinic review tool, if applicable. No additional management support is needed unless otherwise documented below in the visit note. 

## 2015-04-21 NOTE — Patient Instructions (Signed)
Generalized Anxiety Disorder Generalized anxiety disorder (GAD) is a mental disorder. It interferes with life functions, including relationships, work, and school. GAD is different from normal anxiety, which everyone experiences at some point in their lives in response to specific life events and activities. Normal anxiety actually helps us prepare for and get through these life events and activities. Normal anxiety goes away after the event or activity is over.  GAD causes anxiety that is not necessarily related to specific events or activities. It also causes excess anxiety in proportion to specific events or activities. The anxiety associated with GAD is also difficult to control. GAD can vary from mild to severe. People with severe GAD can have intense waves of anxiety with physical symptoms (panic attacks).  SYMPTOMS The anxiety and worry associated with GAD are difficult to control. This anxiety and worry are related to many life events and activities and also occur more days than not for 6 months or longer. People with GAD also have three or more of the following symptoms (one or more in children):  Restlessness.   Fatigue.  Difficulty concentrating.   Irritability.  Muscle tension.  Difficulty sleeping or unsatisfying sleep. DIAGNOSIS GAD is diagnosed through an assessment by your health care provider. Your health care provider will ask you questions aboutyour mood,physical symptoms, and events in your life. Your health care provider may ask you about your medical history and use of alcohol or drugs, including prescription medicines. Your health care provider may also do a physical exam and blood tests. Certain medical conditions and the use of certain substances can cause symptoms similar to those associated with GAD. Your health care provider may refer you to a mental health specialist for further evaluation. TREATMENT The following therapies are usually used to treat GAD:    Medication. Antidepressant medication usually is prescribed for long-term daily control. Antianxiety medicines may be added in severe cases, especially when panic attacks occur.   Talk therapy (psychotherapy). Certain types of talk therapy can be helpful in treating GAD by providing support, education, and guidance. A form of talk therapy called cognitive behavioral therapy can teach you healthy ways to think about and react to daily life events and activities.  Stress managementtechniques. These include yoga, meditation, and exercise and can be very helpful when they are practiced regularly. A mental health specialist can help determine which treatment is best for you. Some people see improvement with one therapy. However, other people require a combination of therapies.   This information is not intended to replace advice given to you by your health care provider. Make sure you discuss any questions you have with your health care provider.   Document Released: 07/17/2012 Document Revised: 04/12/2014 Document Reviewed: 07/17/2012 Elsevier Interactive Patient Education 2016 [employer] Inc[employer]  

## 2015-04-22 NOTE — Assessment & Plan Note (Signed)
con't cozaar and norvasc bp runs better at home per pt Will hold off on changing meds---  Pt will check bp when she gets home and let us know what it is

## 2015-06-24 ENCOUNTER — Other Ambulatory Visit: Payer: Self-pay | Admitting: Family Medicine

## 2015-06-25 ENCOUNTER — Ambulatory Visit (INDEPENDENT_AMBULATORY_CARE_PROVIDER_SITE_OTHER): Payer: Medicare Other | Admitting: Neurology

## 2015-06-25 ENCOUNTER — Encounter: Payer: Self-pay | Admitting: Neurology

## 2015-06-25 VITALS — BP 127/60 | HR 65 | Ht 65.0 in | Wt 185.5 lb

## 2015-06-25 DIAGNOSIS — G8929 Other chronic pain: Secondary | ICD-10-CM

## 2015-06-25 DIAGNOSIS — G609 Hereditary and idiopathic neuropathy, unspecified: Secondary | ICD-10-CM | POA: Diagnosis not present

## 2015-06-25 DIAGNOSIS — M545 Other chronic pain: Secondary | ICD-10-CM

## 2015-06-25 MED ORDER — PREGABALIN 25 MG PO CAPS: 25.0000 mg | ORAL_CAPSULE | Freq: Two times a day (BID) | ORAL | Status: DC

## 2015-06-25 MED ORDER — PREGABALIN 100 MG PO CAPS: 100.0000 mg | ORAL_CAPSULE | Freq: Every day | ORAL | Status: DC

## 2015-06-25 NOTE — Progress Notes (Signed)
Reason for visit: Peripheral neuropathy  Krystal Henderson is an 82 y.o. female  History of present illness:  Krystal Henderson is an 82 year old right-handed white female with a history of a peripheral neuropathy. The patient also has some discomfort in the low back following lumbosacral spine surgery. She denies any sciatica type pain down the legs. She has some mild gait instability, she is on Lyrica for her neuropathy that usually works fairly well, but occasionally she may have some increased pain on certain days. The patient generally sleeps fairly well with the 100 mg dose at night of Lyrica, but she cannot tolerate a higher dose than 25 mg during the day because the medication makes her somewhat staggery. The patient is not having falls, she will occasionally have a pop in the right hip that is associated with some discomfort. The patient has some low back pain particularly when she is cooking, working in [employer]. She returns to this office for an evaluation.  Past Medical History  Diagnosis Date  . HTN (hypertension)   . Hyperlipemia 2006    LDL 130  . Esophageal reflux     inactive  . Anxiety     PMH of  . Hypoglycemia     reactive  . Peripheral neuropathy ([place])   . Vitamin B12 deficiency   . Personal history of colonic polyps     adenomatous  . Diverticulosis of colon (without mention of hemorrhage)   . DJD (degenerative joint disease)   . Angiodysplasia 2007    @ colonoscopy  . Pneumonia 2012    hx of pneumonia   . Anemia     hx of   . Pelvic fracture ([place]) 12/30/2011    [facility]  . Chronic low back pain 06/21/2014    Past Surgical History  Procedure Laterality Date  . Tear duct probing      X 2  . Total hip arthroplasty  2000    right  . Facial cosmetic surgery    . Tubal ligation    . Hemorroidectomy    . Dilation and curettage of uterus    . Colonoscopy w/ polypectomy      X 2 , Dr  Verl Blalock; angiodysplasia. Due 2022  . Lumbar  laminectomy/decompression microdiscectomy  09/10/2011    Procedure: LUMBAR LAMINECTOMY/DECOMPRESSION MICRODISCECTOMY;  Surgeon: Johnn Hai, MD;  Location: [facility];  Service: Orthopedics;  Laterality: N/A;  decompression laminectomy L2-3, L3-4, L4-5  . Orif wrist fracture  01/11/2012    Procedure: OPEN REDUCTION INTERNAL FIXATION (ORIF) WRIST FRACTURE;  Surgeon: Tennis Must, MD;  Location: [facility];  Service: Orthopedics;  Laterality: Right;  . Cataract surgery  12-26-12 and 01-09-13  . Colonoscopy  2011    neg    Family History  Problem Relation Age of Onset  . Colon cancer Neg Hx   . Diabetes Neg Hx   . Heart attack Father 79  . Kidney disease Mother     renal failure  . Throat cancer Sister     smoker  . Osteoporosis Sister   . Heart attack Brother 20  . Stroke Brother 66    smoker  . Depression Maternal Uncle   . Cancer Brother     X3  lung cancer, all smokers  . Peripheral vascular disease Daughter     Social history:  reports that she has never smoked. She has never used smokeless tobacco. She reports that she drinks about 1.8 oz  of alcohol per week. She reports that she does not use illicit drugs.    Allergies  Allergen Reactions  . Codeine     REACTION: nausea  . Duloxetine     REACTION: intolerance  . Sertraline Hcl     REACTION: intolerance    Medications:  Prior to Admission medications   Medication Sig Start Date End Date Taking? Authorizing Provider  ALPRAZolam Duanne Moron) 0.25 MG tablet 1 every 8-12 hrs prn , not routinely 04/21/15  Yes Yvonne R Lowne, DO  amLODipine (NORVASC) 5 MG tablet Take 1 tablet (5 mg total) by mouth daily. 04/21/15  Yes Rosalita Chessman, DO  Ascorbic Acid (VITAMIN C) 1000 MG tablet Take 1,000 mg by mouth daily.    Yes Historical Provider, MD  aspirin 81 MG tablet Take 81 mg by mouth daily.    Yes Historical Provider, MD  Calcium Carbonate (CALCIUM 600 PO) Take 1 capsule by mouth daily.    Yes Historical Provider, MD    Cholecalciferol (VITAMIN D) 2000 UNITS CAPS Take 1 capsule by mouth daily.    Yes Historical Provider, MD  furosemide (LASIX) 20 MG tablet take 1 tablet by mouth once daily 02/20/15  Yes Rosalita Chessman, DO  losartan (COZAAR) 100 MG tablet take 1 tablet by mouth once daily 06/24/15  Yes Rosalita Chessman, DO  meclizine (ANTIVERT) 25 MG tablet take 1/2 to 1 tablet by mouth once daily 10/18/14  Yes Rosalita Chessman, DO  Multiple Vitamin (MULTIVITAMIN WITH MINERALS) TABS Take 1 tablet by mouth daily.   Yes Historical Provider, MD  Omega-3 Fatty Acids (FISH OIL) 1000 MG CAPS Take 1 capsule by mouth daily.    Yes Historical Provider, MD  polyethylene glycol (MIRALAX / GLYCOLAX) packet Take 17 g by mouth daily. 01/03/12  Yes Nishant Dhungel, MD  pregabalin (LYRICA) 100 MG capsule Take 1 capsule (100 mg total) by mouth at bedtime. 06/25/15  Yes Kathrynn Ducking, MD  pregabalin (LYRICA) 25 MG capsule Take 1 capsule (25 mg total) by mouth 2 (two) times daily. 06/25/15  Yes Kathrynn Ducking, MD  traMADol (ULTRAM) 50 MG tablet Take 50 mg by mouth every 6 (six) hours as needed.   Yes Historical Provider, MD    ROS:  Out of a complete 14 system review of symptoms, the patient complains only of the following symptoms, and all other reviewed systems are negative.  Blurred vision Back pain Dizziness Moles  Blood pressure 127/60, pulse 65, height 5\' 5"  (1.651 m), weight 185 lb 8 oz (84.142 kg).  Physical Exam  General: The patient is alert and cooperative at the time of the examination.  Skin: No significant peripheral edema is noted.   Neurologic Exam  Mental status: The patient is alert and oriented x 3 at the time of the examination. The patient has apparent normal recent and remote memory, with an apparently normal attention span and concentration ability.   Cranial nerves: Facial symmetry is present. Speech is normal, no aphasia or dysarthria is noted. Extraocular movements are full. Visual fields are  full.  Motor: The patient has good strength in all 4 extremities.  Sensory examination: Soft touch sensation is symmetric on the face, arms, and legs.  Coordination: The patient has good finger-nose-finger and heel-to-shin bilaterally.  Gait and station: The patient has a normal gait. Tandem gait is unsteady. Romberg is negative. No drift is seen.  Reflexes: Deep tendon reflexes are symmetric, decreased at the left knee jerk reflex, both ankle  jerk reflexes.   Assessment/Plan:  1. Peripheral neuropathy  2. Mild gait disturbance  3. Chronic low back pain  The patient will be given a lumbar support device prescription for activities that bring on her low back pain such as working in the kitchen. The patient will be given prescriptions for her Lyrica, she will follow-up in 6 months, sooner if needed.  Jill Alexanders MD 06/25/2015 7:50 PM  [facility] [address] [place], [place] [address]  Phone [phone] Fax [phone]

## 2015-07-22 ENCOUNTER — Ambulatory Visit (HOSPITAL_BASED_OUTPATIENT_CLINIC_OR_DEPARTMENT_OTHER)
Admission: RE | Admit: 2015-07-22 | Discharge: 2015-07-22 | Disposition: A | Discharge status: Home / Self Care | Payer: Medicare Other | Source: Ambulatory Visit | Attending: Family Medicine | Admitting: Family Medicine

## 2015-07-22 ENCOUNTER — Ambulatory Visit (INDEPENDENT_AMBULATORY_CARE_PROVIDER_SITE_OTHER): Payer: Medicare Other | Admitting: Family Medicine

## 2015-07-22 ENCOUNTER — Encounter: Payer: Self-pay | Admitting: Family Medicine

## 2015-07-22 VITALS — BP 134/68 | HR 64 | Temp 97.9°F | Ht 65.0 in | Wt 184.4 lb

## 2015-07-22 DIAGNOSIS — R0609 Other forms of dyspnea: Secondary | ICD-10-CM | POA: Insufficient documentation

## 2015-07-22 DIAGNOSIS — I10 Essential (primary) hypertension: Secondary | ICD-10-CM

## 2015-07-22 DIAGNOSIS — I517 Cardiomegaly: Secondary | ICD-10-CM | POA: Insufficient documentation

## 2015-07-22 DIAGNOSIS — E785 Hyperlipidemia, unspecified: Secondary | ICD-10-CM | POA: Diagnosis not present

## 2015-07-22 DIAGNOSIS — F419 Anxiety disorder, unspecified: Secondary | ICD-10-CM

## 2015-07-22 MED ORDER — LOSARTAN POTASSIUM 100 MG PO TABS: 100.0000 mg | ORAL_TABLET | Freq: Every day | ORAL | Status: DC

## 2015-07-22 MED ORDER — AMLODIPINE BESYLATE 5 MG PO TABS: 5.0000 mg | ORAL_TABLET | Freq: Every day | ORAL | Status: DC

## 2015-07-22 MED ORDER — ALPRAZOLAM 0.25 MG PO TABS: ORAL_TABLET | ORAL | Status: DC

## 2015-07-22 MED ORDER — FUROSEMIDE 20 MG PO TABS: 20.0000 mg | ORAL_TABLET | Freq: Every day | ORAL | Status: DC

## 2015-07-22 NOTE — Progress Notes (Signed)
Patient ID: Krystal Henderson, female    DOB: 1932-12-10  Age: 82 y.o. MRN: [id number]    Subjective:  Subjective HPI Krystal Henderson presents for doe since 83st birthday.    No chest pain or palpitations -- she is also here for f/u No calf pain, no abd pain.  No fever / chills  Review of Systems  Constitutional: Negative for diaphoresis, appetite change, fatigue and unexpected weight change.  Eyes: Negative for pain, redness and visual disturbance.  Respiratory: Positive for shortness of breath. Negative for cough, chest tightness and wheezing.   Cardiovascular: Negative for chest pain, palpitations and leg swelling.  Endocrine: Negative for cold intolerance, heat intolerance, polydipsia, polyphagia and polyuria.  Genitourinary: Negative for dysuria, frequency and difficulty urinating.  Neurological: Negative for dizziness, light-headedness, numbness and headaches.    History Past Medical History  Diagnosis Date  . HTN (hypertension)   . Hyperlipemia 2006    LDL 130  . Esophageal reflux     inactive  . Anxiety     PMH of  . Hypoglycemia     reactive  . Peripheral neuropathy ([place])   . Vitamin B12 deficiency   . Personal history of colonic polyps     adenomatous  . Diverticulosis of colon (without mention of hemorrhage)   . DJD (degenerative joint disease)   . Angiodysplasia 2007    @ colonoscopy  . Pneumonia 2012    hx of pneumonia   . Anemia     hx of   . Pelvic fracture ([place]) 12/30/2011    [facility]  . Chronic low back pain 06/21/2014    She has past surgical history that includes Tear duct probing; Total hip arthroplasty (2000); Facial cosmetic surgery; Tubal ligation; Hemorroidectomy; Dilation and curettage of uterus; Colonoscopy w/ polypectomy; Lumbar laminectomy/decompression microdiscectomy (09/10/2011); ORIF wrist fracture (01/11/2012); cataract surgery (12-26-12 and 01-09-13); and Colonoscopy (2011).   Her family history includes Cancer in her brother;  Depression in her maternal uncle; Heart attack (age of onset: 55) in her brother; Heart attack (age of onset: 28) in her father; Kidney disease in her mother; Osteoporosis in her sister; Peripheral vascular disease in her daughter; Stroke (age of onset: 31) in her brother; Throat cancer in her sister. There is no history of Colon cancer or Diabetes.She reports that she has never smoked. She has never used smokeless tobacco. She reports that she drinks about 1.8 oz of alcohol per week. She reports that she does not use illicit drugs.  Current Outpatient Prescriptions on File Prior to Visit  Medication Sig Dispense Refill  . Ascorbic Acid (VITAMIN C) 1000 MG tablet Take 1,000 mg by mouth daily.     [name] aspirin 81 MG tablet Take 81 mg by mouth daily.     . Calcium Carbonate (CALCIUM 600 PO) Take 1 capsule by mouth daily.     . Cholecalciferol (VITAMIN D) 2000 UNITS CAPS Take 1 capsule by mouth daily.     . meclizine (ANTIVERT) 25 MG tablet take 1/2 to 1 tablet by mouth once daily 30 tablet 0  . Multiple Vitamin (MULTIVITAMIN WITH MINERALS) TABS Take 1 tablet by mouth daily.    . Omega-3 Fatty Acids (FISH OIL) 1000 MG CAPS Take 1 capsule by mouth daily.     . polyethylene glycol (MIRALAX / GLYCOLAX) packet Take 17 g by mouth daily. 10 each 0  . pregabalin (LYRICA) 100 MG capsule Take 1 capsule (100 mg total) by mouth at bedtime. 30 capsule 5  .  pregabalin (LYRICA) 25 MG capsule Take 1 capsule (25 mg total) by mouth 2 (two) times daily. 60 capsule 5  . traMADol (ULTRAM) 50 MG tablet Take 50 mg by mouth every 6 (six) hours as needed.     No current facility-administered medications on file prior to visit.     Objective:  Objective Physical Exam  Constitutional: She is oriented to person, place, and time. She appears well-developed and well-nourished.  HENT:  Head: Normocephalic and atraumatic.  Eyes: Conjunctivae and EOM are normal.  Neck: Normal range of motion. Neck supple. No JVD present.  Carotid bruit is not present. No thyromegaly present.  Cardiovascular: Normal rate, regular rhythm and normal heart sounds.   No murmur heard. Pulmonary/Chest: Effort normal and breath sounds normal. No respiratory distress. She has no wheezes. She has no rales. She exhibits no tenderness.  Musculoskeletal: She exhibits no edema.       Right ankle: She exhibits swelling.       Left ankle: She exhibits swelling.       Right lower leg: She exhibits no tenderness and no swelling.       Left lower leg: She exhibits no tenderness and no swelling.       Legs: Neurological: She is alert and oriented to person, place, and time.  Psychiatric: She has a normal mood and affect. Her behavior is normal.  Nursing note reviewed.  BP 134/68 mmHg  Pulse 64  Temp(Src) 97.9 F (36.6 C) (Oral)  Ht 5\' 5"  (1.651 m)  Wt 184 lb 6.4 oz (83.643 kg)  BMI 30.69 kg/m2  SpO2 88% Wt Readings from Last 3 Encounters:  07/22/15 184 lb 6.4 oz (83.643 kg)  06/25/15 185 lb 8 oz (84.142 kg)  04/21/15 183 lb 6.4 oz (83.19 kg)     Lab Results  Component Value Date   WBC 5.9 04/19/2014   HGB 13.2 04/19/2014   HCT 40.6 04/19/2014   PLT 287.0 04/19/2014   GLUCOSE 80 04/21/2015   CHOL 237* 04/21/2015   TRIG 87.0 04/21/2015   HDL 84.20 04/21/2015   LDLDIRECT 129.0 10/18/2014   LDLCALC 135* 04/21/2015   ALT 14 04/21/2015   AST 16 04/21/2015   NA 140 04/21/2015   K 4.8 04/21/2015   CL 103 04/21/2015   CREATININE 1.49* 04/21/2015   BUN 24* 04/21/2015   CO2 26 04/21/2015   TSH 3.53 11/26/2013   INR 1.3 ratio* 10/29/2009   HGBA1C 5.6 08/24/2011    Mm Digital Screening Bilateral  03/14/2015  CLINICAL DATA:  Screening. EXAM: DIGITAL SCREENING BILATERAL MAMMOGRAM WITH CAD COMPARISON:  Previous exam(s). ACR Breast Density Category b: There are scattered areas of fibroglandular density. FINDINGS: There are no findings suspicious for malignancy. Images were processed with CAD. IMPRESSION: No mammographic evidence of  malignancy. A result letter of this screening mammogram will be mailed directly to the patient. RECOMMENDATION: Screening mammogram in one year. (Code:SM-B-01Y) BI-RADS CATEGORY  1: Negative. Electronically Signed   By: Everlean Alstrom M.D.   On: 03/14/2015 15:44     Assessment & Plan:  Plan I have changed Krystal Henderson's losartan and furosemide. I am also having her maintain her aspirin, Calcium Carbonate (CALCIUM 600 PO), Fish Oil, vitamin C, Vitamin D, multivitamin with minerals, polyethylene glycol, traMADol, meclizine, pregabalin, pregabalin, amLODipine, and ALPRAZolam.  Meds ordered this encounter  Medications  . losartan (COZAAR) 100 MG tablet    Sig: Take 1 tablet (100 mg total) by mouth daily.    Dispense:  30  tablet    Refill:  2  . furosemide (LASIX) 20 MG tablet    Sig: Take 1 tablet (20 mg total) by mouth daily.    Dispense:  30 tablet    Refill:  5  . amLODipine (NORVASC) 5 MG tablet    Sig: Take 1 tablet (5 mg total) by mouth daily.    Dispense:  30 tablet    Refill:  5  . ALPRAZolam (XANAX) 0.25 MG tablet    Sig: 1 every 8-12 hrs prn , not routinely    Dispense:  30 tablet    Refill:  0    Problem List Items Addressed This Visit      Unprioritized   Essential hypertension   Relevant Medications   losartan (COZAAR) 100 MG tablet   furosemide (LASIX) 20 MG tablet   amLODipine (NORVASC) 5 MG tablet   Other Relevant Orders   POCT urinalysis dipstick   Lipid panel   CBC with Differential/Platelet   Comprehensive metabolic panel    Other Visit Diagnoses    DOE (dyspnea on exertion)    -  Primary    Relevant Orders    EKG 12-Lead (Completed)    DG Chest 2 View    POCT urinalysis dipstick    Lipid panel    CBC with Differential/Platelet    Comprehensive metabolic panel    ECHOCARDIOGRAM COMPLETE    Hyperlipidemia        Relevant Medications    losartan (COZAAR) 100 MG tablet    furosemide (LASIX) 20 MG tablet    amLODipine (NORVASC) 5 MG tablet    Other  Relevant Orders    POCT urinalysis dipstick    Lipid panel    CBC with Differential/Platelet    Comprehensive metabolic panel    Anxiety disorder, unspecified anxiety disorder type        Relevant Medications    ALPRAZolam (XANAX) 0.25 MG tablet       Follow-up: Return in about 3 months (around 10/21/2015), or if symptoms worsen or fail to improve, for hypertension.  Ann Held, DO

## 2015-07-22 NOTE — Patient Instructions (Signed)

## 2015-07-22 NOTE — Progress Notes (Signed)
Pre visit review using our clinic review tool, if applicable. No additional management support is needed unless otherwise documented below in the visit note. 

## 2015-07-23 LAB — CBC WITH DIFFERENTIAL/PLATELET
BASOS PCT: 0.3 % (ref 0.0–3.0)
Basophils Absolute: 0 10*3/uL (ref 0.0–0.1)
Eosinophils Absolute: 0.1 10*3/uL (ref 0.0–0.7)
Eosinophils Relative: 1.5 % (ref 0.0–5.0)
HEMATOCRIT: 38.4 % (ref 36.0–46.0)
Hemoglobin: 12.6 g/dL (ref 12.0–15.0)
LYMPHS ABS: 2.1 10*3/uL (ref 0.7–4.0)
LYMPHS PCT: 31 % (ref 12.0–46.0)
MCHC: 32.9 g/dL (ref 30.0–36.0)
MCV: 94.6 fl (ref 78.0–100.0)
MONOS PCT: 7.1 % (ref 3.0–12.0)
Monocytes Absolute: 0.5 10*3/uL (ref 0.1–1.0)
NEUTROS ABS: 4.1 10*3/uL (ref 1.4–7.7)
Neutrophils Relative %: 60.1 % (ref 43.0–77.0)
PLATELETS: 305 10*3/uL (ref 150.0–400.0)
RBC: 4.05 Mil/uL (ref 3.87–5.11)
RDW: 14.9 % (ref 11.5–15.5)
WBC: 6.8 10*3/uL (ref 4.0–10.5)

## 2015-07-23 LAB — COMPREHENSIVE METABOLIC PANEL
ALT: 13 U/L (ref 0–35)
AST: 15 U/L (ref 0–37)
Albumin: 4.2 g/dL (ref 3.5–5.2)
Alkaline Phosphatase: 55 U/L (ref 39–117)
BILIRUBIN TOTAL: 0.4 mg/dL (ref 0.2–1.2)
BUN: 23 mg/dL (ref 6–23)
CALCIUM: 9.8 mg/dL (ref 8.4–10.5)
CHLORIDE: 103 meq/L (ref 96–112)
CO2: 29 meq/L (ref 19–32)
Creatinine, Ser: 1.48 mg/dL — ABNORMAL HIGH (ref 0.40–1.20)
GFR: 35.97 mL/min — AB (ref >60.00)
Glucose, Bld: 83 mg/dL (ref 70–99)
POTASSIUM: 4.3 meq/L (ref 3.5–5.1)
Sodium: 139 mEq/L (ref 135–145)
Total Protein: 7.5 g/dL (ref 6.0–8.3)

## 2015-07-23 LAB — LIPID PANEL
CHOLESTEROL: 223 mg/dL — AB (ref 0–200)
HDL: 74.9 mg/dL (ref >39.00)
LDL Cholesterol: 119 mg/dL — ABNORMAL HIGH (ref 0–99)
NonHDL: 147.81
TRIGLYCERIDES: 145 mg/dL (ref 0.0–149.0)
Total CHOL/HDL Ratio: 3
VLDL: 29 mg/dL (ref 0.0–40.0)

## 2015-08-13 ENCOUNTER — Ambulatory Visit (HOSPITAL_BASED_OUTPATIENT_CLINIC_OR_DEPARTMENT_OTHER)
Admission: RE | Admit: 2015-08-13 | Discharge: 2015-08-13 | Disposition: A | Discharge status: Home / Self Care | Payer: Medicare Other | Source: Ambulatory Visit | Attending: Family Medicine | Admitting: Family Medicine

## 2015-08-13 DIAGNOSIS — I351 Nonrheumatic aortic (valve) insufficiency: Secondary | ICD-10-CM | POA: Insufficient documentation

## 2015-08-13 DIAGNOSIS — I358 Other nonrheumatic aortic valve disorders: Secondary | ICD-10-CM | POA: Diagnosis not present

## 2015-08-13 DIAGNOSIS — I071 Rheumatic tricuspid insufficiency: Secondary | ICD-10-CM | POA: Insufficient documentation

## 2015-08-13 DIAGNOSIS — R0609 Other forms of dyspnea: Secondary | ICD-10-CM

## 2015-08-13 DIAGNOSIS — I340 Nonrheumatic mitral (valve) insufficiency: Secondary | ICD-10-CM | POA: Insufficient documentation

## 2015-08-13 DIAGNOSIS — I119 Hypertensive heart disease without heart failure: Secondary | ICD-10-CM | POA: Diagnosis not present

## 2015-08-13 DIAGNOSIS — R0600 Dyspnea, unspecified: Secondary | ICD-10-CM | POA: Diagnosis present

## 2015-08-13 NOTE — Progress Notes (Signed)
Echocardiogram 2D Echocardiogram has been performed.  Krystal Henderson 08/13/2015, 11:50 AM

## 2015-08-23 ENCOUNTER — Other Ambulatory Visit: Payer: Self-pay | Admitting: Family Medicine

## 2015-08-28 DIAGNOSIS — H43813 Vitreous degeneration, bilateral: Secondary | ICD-10-CM | POA: Diagnosis not present

## 2015-08-28 DIAGNOSIS — Z0100 Encounter for examination of eyes and vision without abnormal findings: Secondary | ICD-10-CM | POA: Diagnosis not present

## 2015-08-28 DIAGNOSIS — H26493 Other secondary cataract, bilateral: Secondary | ICD-10-CM | POA: Diagnosis not present

## 2015-08-28 DIAGNOSIS — H04123 Dry eye syndrome of bilateral lacrimal glands: Secondary | ICD-10-CM | POA: Diagnosis not present

## 2015-09-11 DIAGNOSIS — H26492 Other secondary cataract, left eye: Secondary | ICD-10-CM | POA: Diagnosis not present

## 2015-09-21 ENCOUNTER — Other Ambulatory Visit: Payer: Self-pay | Admitting: Family Medicine

## 2015-10-23 ENCOUNTER — Encounter: Payer: Self-pay | Admitting: Family Medicine

## 2015-10-23 ENCOUNTER — Ambulatory Visit (INDEPENDENT_AMBULATORY_CARE_PROVIDER_SITE_OTHER): Payer: Medicare Other | Admitting: Family Medicine

## 2015-10-23 VITALS — BP 124/74 | HR 68 | Temp 98.7°F | Ht 65.0 in | Wt 182.4 lb

## 2015-10-23 DIAGNOSIS — R600 Localized edema: Secondary | ICD-10-CM | POA: Diagnosis not present

## 2015-10-23 DIAGNOSIS — R0609 Other forms of dyspnea: Secondary | ICD-10-CM

## 2015-10-23 NOTE — Progress Notes (Signed)
Pre visit review using our clinic review tool, if applicable. No additional management support is needed unless otherwise documented below in the visit note. 

## 2015-10-23 NOTE — Patient Instructions (Signed)

## 2015-10-23 NOTE — Progress Notes (Signed)
Patient ID: Krystal Henderson, female    DOB: 1932/06/03  Age: 82 y.o. MRN: [id number]    Subjective:  Subjective HPI Krystal Henderson presents for f/u edema and c/o sob with exertion still.  No worse than previous.  Review of Systems  Constitutional: Negative for diaphoresis, appetite change, fatigue and unexpected weight change.  Eyes: Negative for pain, redness and visual disturbance.  Respiratory: Positive for shortness of breath. Negative for cough, chest tightness and wheezing.   Cardiovascular: Positive for leg swelling. Negative for chest pain and palpitations.  Endocrine: Negative for cold intolerance, heat intolerance, polydipsia, polyphagia and polyuria.  Genitourinary: Negative for dysuria, frequency and difficulty urinating.  Neurological: Negative for dizziness, light-headedness, numbness and headaches.    History Past Medical History  Diagnosis Date  . HTN (hypertension)   . Hyperlipemia 2006    LDL 130  . Esophageal reflux     inactive  . Anxiety     PMH of  . Hypoglycemia     reactive  . Peripheral neuropathy ([place])   . Vitamin B12 deficiency   . Personal history of colonic polyps     adenomatous  . Diverticulosis of colon (without mention of hemorrhage)   . DJD (degenerative joint disease)   . Angiodysplasia 2007    @ colonoscopy  . Pneumonia 2012    hx of pneumonia   . Anemia     hx of   . Pelvic fracture ([place]) 12/30/2011    [facility]  . Chronic low back pain 06/21/2014    She has past surgical history that includes Tear duct probing; Total hip arthroplasty (2000); Facial cosmetic surgery; Tubal ligation; Hemorroidectomy; Dilation and curettage of uterus; Colonoscopy w/ polypectomy; Lumbar laminectomy/decompression microdiscectomy (09/10/2011); ORIF wrist fracture (01/11/2012); cataract surgery (12-26-12 and 01-09-13); and Colonoscopy (2011).   Her family history includes Cancer in her brother; Depression in her maternal uncle; Heart attack (age of  onset: 51) in her brother; Heart attack (age of onset: 54) in her father; Kidney disease in her mother; Osteoporosis in her sister; Peripheral vascular disease in her daughter; Stroke (age of onset: 41) in her brother; Throat cancer in her sister. There is no history of Colon cancer or Diabetes.She reports that she has never smoked. She has never used smokeless tobacco. She reports that she drinks about 1.8 oz of alcohol per week. She reports that she does not use illicit drugs.  Current Outpatient Prescriptions on File Prior to Visit  Medication Sig Dispense Refill  . ALPRAZolam (XANAX) 0.25 MG tablet 1 every 8-12 hrs prn , not routinely 30 tablet 0  . amLODipine (NORVASC) 5 MG tablet take 1 tablet by mouth once daily 30 tablet 5  . Ascorbic Acid (VITAMIN C) 1000 MG tablet Take 1,000 mg by mouth daily.     [name] aspirin 81 MG tablet Take 81 mg by mouth daily.     . Calcium Carbonate (CALCIUM 600 PO) Take 1 capsule by mouth daily.     . Cholecalciferol (VITAMIN D) 2000 UNITS CAPS Take 1 capsule by mouth daily.     . furosemide (LASIX) 20 MG tablet take 1 tablet by mouth once daily 30 tablet 5  . losartan (COZAAR) 100 MG tablet take 1 tablet by mouth once daily 30 tablet 4  . meclizine (ANTIVERT) 25 MG tablet take 1/2 to 1 tablet by mouth once daily 30 tablet 0  . Multiple Vitamin (MULTIVITAMIN WITH MINERALS) TABS Take 1 tablet by mouth daily.    [name]  Omega-3 Fatty Acids (FISH OIL) 1000 MG CAPS Take 1 capsule by mouth daily.     . polyethylene glycol (MIRALAX / GLYCOLAX) packet Take 17 g by mouth daily. 10 each 0  . pregabalin (LYRICA) 100 MG capsule Take 1 capsule (100 mg total) by mouth at bedtime. 30 capsule 5  . pregabalin (LYRICA) 25 MG capsule Take 1 capsule (25 mg total) by mouth 2 (two) times daily. 60 capsule 5  . traMADol (ULTRAM) 50 MG tablet Take 50 mg by mouth every 6 (six) hours as needed.     No current facility-administered medications on file prior to visit.     Objective:   Objective Physical Exam  Constitutional: She is oriented to person, place, and time. She appears well-developed and well-nourished.  HENT:  Head: Normocephalic and atraumatic.  Eyes: Conjunctivae and EOM are normal.  Neck: Normal range of motion. Neck supple. No JVD present. Carotid bruit is not present. No thyromegaly present.  Cardiovascular: Normal rate, regular rhythm and normal heart sounds.   No murmur heard. Pulmonary/Chest: Effort normal and breath sounds normal. No respiratory distress. She has no wheezes. She has no rales. She exhibits no tenderness.  Musculoskeletal: She exhibits edema.       Right ankle: She exhibits swelling.  Neurological: She is alert and oriented to person, place, and time.  Psychiatric: She has a normal mood and affect. Her behavior is normal. Judgment and thought content normal.  Nursing note and vitals reviewed.  BP 124/74 mmHg  Pulse 68  Temp(Src) 98.7 F (37.1 C) (Oral)  Ht 5\' 5"  (1.651 m)  Wt 182 lb 6 oz (82.725 kg)  BMI 30.35 kg/m2  SpO2 96% Wt Readings from Last 3 Encounters:  10/23/15 182 lb 6 oz (82.725 kg)  07/22/15 184 lb 6.4 oz (83.643 kg)  06/25/15 185 lb 8 oz (84.142 kg)     Lab Results  Component Value Date   WBC 6.8 07/22/2015   HGB 12.6 07/22/2015   HCT 38.4 07/22/2015   PLT 305.0 07/22/2015   GLUCOSE 83 07/22/2015   CHOL 223* 07/22/2015   TRIG 145.0 07/22/2015   HDL 74.90 07/22/2015   LDLDIRECT 129.0 10/18/2014   LDLCALC 119* 07/22/2015   ALT 13 07/22/2015   AST 15 07/22/2015   NA 139 07/22/2015   K 4.3 07/22/2015   CL 103 07/22/2015   CREATININE 1.48* 07/22/2015   BUN 23 07/22/2015   CO2 29 07/22/2015   TSH 3.53 11/26/2013   INR 1.3 ratio* 10/29/2009   HGBA1C 5.6 08/24/2011    No results found.   Assessment & Plan:  Plan I am having Ms. Krystal Henderson maintain her aspirin, Calcium Carbonate (CALCIUM 600 PO), Fish Oil, vitamin C, Vitamin D, multivitamin with minerals, polyethylene glycol, traMADol, meclizine,  pregabalin, pregabalin, ALPRAZolam, amLODipine, furosemide, and losartan.  No orders of the defined types were placed in this encounter.   1. DOE (dyspnea on exertion) - Ambulatory referral to Cardiology Echo improved from previous but pt is concerned due to slightly swelling in R ankle and her sob. If and cp or worsening sob-- go to ER Cardiology refer placed   Problem List Items Addressed This Visit    None    Visit Diagnoses    DOE (dyspnea on exertion)    -  Primary    Relevant Orders    Ambulatory referral to Cardiology       Follow-up: Return in about 3 months (around 01/23/2016) for hypertension, hyperlipidemia.  Ann Held,  DO

## 2015-10-23 NOTE — Assessment & Plan Note (Signed)
Elevated legs con't lasix Compression socks/ stockings

## 2015-11-04 ENCOUNTER — Emergency Department (HOSPITAL_COMMUNITY)
Admission: EM | Admit: 2015-11-04 | Discharge: 2015-11-04 | Disposition: A | Discharge status: Home / Self Care | Payer: Medicare Other | Attending: Emergency Medicine | Admitting: Emergency Medicine

## 2015-11-04 ENCOUNTER — Encounter (HOSPITAL_COMMUNITY): Payer: Self-pay | Admitting: Emergency Medicine

## 2015-11-04 ENCOUNTER — Emergency Department (HOSPITAL_COMMUNITY): Payer: Medicare Other

## 2015-11-04 DIAGNOSIS — S32010A Wedge compression fracture of first lumbar vertebra, initial encounter for closed fracture: Secondary | ICD-10-CM | POA: Diagnosis not present

## 2015-11-04 DIAGNOSIS — M545 Low back pain: Secondary | ICD-10-CM | POA: Diagnosis not present

## 2015-11-04 DIAGNOSIS — Z8781 Personal history of (healed) traumatic fracture: Secondary | ICD-10-CM

## 2015-11-04 DIAGNOSIS — N183 Chronic kidney disease, stage 3 (moderate): Secondary | ICD-10-CM | POA: Diagnosis not present

## 2015-11-04 DIAGNOSIS — Z79899 Other long term (current) drug therapy: Secondary | ICD-10-CM | POA: Insufficient documentation

## 2015-11-04 DIAGNOSIS — Y929 Unspecified place or not applicable: Secondary | ICD-10-CM | POA: Insufficient documentation

## 2015-11-04 DIAGNOSIS — Y999 Unspecified external cause status: Secondary | ICD-10-CM | POA: Diagnosis not present

## 2015-11-04 DIAGNOSIS — Z96651 Presence of right artificial knee joint: Secondary | ICD-10-CM | POA: Insufficient documentation

## 2015-11-04 DIAGNOSIS — S32019A Unspecified fracture of first lumbar vertebra, initial encounter for closed fracture: Secondary | ICD-10-CM | POA: Diagnosis not present

## 2015-11-04 DIAGNOSIS — Y939 Activity, unspecified: Secondary | ICD-10-CM | POA: Insufficient documentation

## 2015-11-04 DIAGNOSIS — S32000A Wedge compression fracture of unspecified lumbar vertebra, initial encounter for closed fracture: Secondary | ICD-10-CM

## 2015-11-04 DIAGNOSIS — X58XXXA Exposure to other specified factors, initial encounter: Secondary | ICD-10-CM | POA: Diagnosis not present

## 2015-11-04 DIAGNOSIS — I129 Hypertensive chronic kidney disease with stage 1 through stage 4 chronic kidney disease, or unspecified chronic kidney disease: Secondary | ICD-10-CM | POA: Diagnosis not present

## 2015-11-04 DIAGNOSIS — Z7982 Long term (current) use of aspirin: Secondary | ICD-10-CM | POA: Insufficient documentation

## 2015-11-04 DIAGNOSIS — S3992XA Unspecified injury of lower back, initial encounter: Secondary | ICD-10-CM | POA: Diagnosis present

## 2015-11-04 HISTORY — DX: Personal history of (healed) traumatic fracture: Z87.81

## 2015-11-04 LAB — BASIC METABOLIC PANEL
ANION GAP: 8 (ref 5–15)
BUN: 20 mg/dL (ref 6–20)
CHLORIDE: 105 mmol/L (ref 101–111)
CO2: 26 mmol/L (ref 22–32)
CREATININE: 1.66 mg/dL — AB (ref 0.44–1.00)
Calcium: 9.3 mg/dL (ref 8.9–10.3)
GFR calc non Af Amer: 28 mL/min — ABNORMAL LOW (ref >60)
GFR, EST AFRICAN AMERICAN: 32 mL/min — AB (ref >60)
GLUCOSE: 106 mg/dL — AB (ref 65–99)
Potassium: 4.4 mmol/L (ref 3.5–5.1)
Sodium: 139 mmol/L (ref 135–145)

## 2015-11-04 LAB — CBC
HCT: 39.9 % (ref 36.0–46.0)
HEMOGLOBIN: 12.9 g/dL (ref 12.0–15.0)
MCH: 31.1 pg (ref 26.0–34.0)
MCHC: 32.3 g/dL (ref 30.0–36.0)
MCV: 96.1 fL (ref 78.0–100.0)
Platelets: 305 10*3/uL (ref 150–400)
RBC: 4.15 MIL/uL (ref 3.87–5.11)
RDW: 14.5 % (ref 11.5–15.5)
WBC: 7.1 10*3/uL (ref 4.0–10.5)

## 2015-11-04 LAB — URINALYSIS, ROUTINE W REFLEX MICROSCOPIC
Bilirubin Urine: NEGATIVE
GLUCOSE, UA: NEGATIVE mg/dL
HGB URINE DIPSTICK: NEGATIVE
Ketones, ur: NEGATIVE mg/dL
LEUKOCYTES UA: NEGATIVE
Nitrite: NEGATIVE
PROTEIN: NEGATIVE mg/dL
SPECIFIC GRAVITY, URINE: 1.008 (ref 1.005–1.030)
pH: 7 (ref 5.0–8.0)

## 2015-11-04 MED ORDER — HYDROCODONE-ACETAMINOPHEN 5-325 MG PO TABS: 1.0000 | ORAL_TABLET | ORAL | 0 refills | Status: DC | PRN

## 2015-11-04 MED ORDER — METHOCARBAMOL 500 MG PO TABS
500.0000 mg | ORAL_TABLET | Freq: Once | ORAL | Status: AC
  Administered 2015-11-04: 500 mg via ORAL
  Filled 2015-11-04: qty 1

## 2015-11-04 MED ORDER — MORPHINE SULFATE (PF) 4 MG/ML IV SOLN
4.0000 mg | Freq: Once | INTRAVENOUS | Status: AC
  Administered 2015-11-04: 4 mg via INTRAVENOUS
  Filled 2015-11-04 (×2): qty 1

## 2015-11-04 MED ORDER — CYCLOBENZAPRINE HCL 10 MG PO TABS: 10.0000 mg | ORAL_TABLET | Freq: Three times a day (TID) | ORAL | 0 refills | Status: DC | PRN

## 2015-11-04 MED ORDER — KETOROLAC TROMETHAMINE 30 MG/ML IJ SOLN
30.0000 mg | Freq: Once | INTRAMUSCULAR | Status: AC
  Administered 2015-11-04: 30 mg via INTRAVENOUS
  Filled 2015-11-04: qty 1

## 2015-11-04 NOTE — ED Provider Notes (Signed)
[facility] DEPT Provider Note   CSN: [id number] Arrival date & time: 11/04/15  [id number]  First Provider Contact:  First MD Initiated Contact with Patient 11/04/15 [phone]        History   Chief Complaint Chief Complaint  Patient presents with  . Hip Pain  . Back Pain    HPI Krystal Henderson is a 82 y.o. female.  Patient reports 5 days of worsening low back pain with radiation towards both of her hips.  She denies weakness of her legs.  She does report a history of prior right hip arthroplasty.  Patient reports pain in her low back with range of motion.  She does all core aerobics.  She is not remember new injury or trauma.  Her pain is moderate in severity.  She states that her pain is limiting her ability to get out of the bed and chair easily.   The history is provided by the patient and medical records.    Past Medical History:  Diagnosis Date  . Anemia    hx of   . Angiodysplasia 2007   @ colonoscopy  . Anxiety    PMH of  . Chronic low back pain 06/21/2014  . Diverticulosis of colon (without mention of hemorrhage)   . DJD (degenerative joint disease)   . Esophageal reflux    inactive  . HTN (hypertension)   . Hyperlipemia 2006   LDL 130  . Hypoglycemia    reactive  . Pelvic fracture ([place]) 12/30/2011   [facility]  . Peripheral neuropathy ([place])   . Personal history of colonic polyps    adenomatous  . Pneumonia 2012   hx of pneumonia   . Vitamin B12 deficiency     Patient Active Problem List   Diagnosis Date Noted  . Lower extremity edema 10/23/2015  . Chronic low back pain 06/21/2014  . Pain in joint, shoulder region 02/11/2014  . Unspecified vitamin D deficiency 03/30/2013  . Chronic renal insufficiency, stage III (moderate) 03/28/2013  . Acute kidney injury ([place]) 01/01/2012  . Spinal stenosis 12/23/2011  . Osteopenia 12/23/2011  . Renal insufficiency, mild 08/24/2011  . Vitamin B12 deficiency 09/24/2010  . IBS (irritable bowel syndrome)  09/24/2010  . Peripheral neuropathy, idiopathic 09/24/2010  . GERD 08/12/2009  . DIVERTICULOSIS, COLON 09/08/2007  . ANGIODYSPLASIA OF INTESTINE WITH HEMORRHAGE 09/08/2007  . ARTHRITIS 09/08/2007  . COLONIC POLYPS, ADENOMATOUS, HX OF 09/08/2007  . HYPERLIPIDEMIA 01/23/2007  . Essential hypertension 01/23/2007  . HYPOGLYCEMIA, REACTIVE 06/30/2006    Past Surgical History:  Procedure Laterality Date  . cataract surgery  12-26-12 and 01-09-13  . COLONOSCOPY  2011   neg  . COLONOSCOPY W/ POLYPECTOMY     X 2 , Dr  Verl Blalock; angiodysplasia. Due 2022  . DILATION AND CURETTAGE OF UTERUS    . FACIAL COSMETIC SURGERY    . HEMORROIDECTOMY    . LUMBAR LAMINECTOMY/DECOMPRESSION MICRODISCECTOMY  09/10/2011   Procedure: LUMBAR LAMINECTOMY/DECOMPRESSION MICRODISCECTOMY;  Surgeon: Johnn Hai, MD;  Location: [facility];  Service: Orthopedics;  Laterality: N/A;  decompression laminectomy L2-3, L3-4, L4-5  . ORIF WRIST FRACTURE  01/11/2012   Procedure: OPEN REDUCTION INTERNAL FIXATION (ORIF) WRIST FRACTURE;  Surgeon: Tennis Must, MD;  Location: [facility];  Service: Orthopedics;  Laterality: Right;  . TEAR DUCT PROBING     X 2  . TOTAL HIP ARTHROPLASTY  2000   right  . TUBAL LIGATION      OB History  No data available       Home Medications    Prior to Admission medications   Medication Sig Start Date End Date Taking? Authorizing Provider  ALPRAZolam (XANAX) 0.25 MG tablet 1 every 8-12 hrs prn , not routinely Patient taking differently: 0.25-0.5 mg 2 (two) times daily as needed for anxiety or sleep.  07/22/15  Yes Alferd Apa Lowne Chase, DO  amLODipine (NORVASC) 5 MG tablet take 1 tablet by mouth once daily Patient taking differently: take 1 tablet by mouth once daily in the morning 08/25/15  Yes Yvonne R Lowne Chase, DO  Ascorbic Acid (VITAMIN C) 1000 MG tablet Take 1,000 mg by mouth daily.    Yes Historical Provider, MD  aspirin 81 MG tablet Take 81 mg by mouth daily.     Yes Historical Provider, MD  Calcium Carbonate (CALCIUM 600 PO) Take 1 capsule by mouth daily.    Yes Historical Provider, MD  Cholecalciferol (VITAMIN D) 2000 UNITS CAPS Take 1 capsule by mouth daily.    Yes Historical Provider, MD  furosemide (LASIX) 20 MG tablet take 1 tablet by mouth once daily Patient taking differently: take 1 tablet by mouth once daily in the morning 08/25/15  Yes Yvonne R Lowne Chase, DO  losartan (COZAAR) 100 MG tablet take 1 tablet by mouth once daily Patient taking differently: take 1 tablet by mouth once daily in the morning 09/22/15  Yes Yvonne R Lowne Chase, DO  meclizine (ANTIVERT) 25 MG tablet take 1/2 to 1 tablet by mouth once daily 10/18/14  Yes Rosalita Chessman Chase, DO  Multiple Vitamin (MULTIVITAMIN WITH MINERALS) TABS Take 1 tablet by mouth daily.   Yes Historical Provider, MD  Omega-3 Fatty Acids (FISH OIL) 1000 MG CAPS Take 1 capsule by mouth daily.    Yes Historical Provider, MD  Polyethyl Glycol-Propyl Glycol (SYSTANE ULTRA OP) Apply 1 drop to eye daily as needed (for dry eyes).   Yes Historical Provider, MD  polyethylene glycol (MIRALAX / GLYCOLAX) packet Take 17 g by mouth daily. 01/03/12  Yes Nishant Dhungel, MD  pregabalin (LYRICA) 100 MG capsule Take 1 capsule (100 mg total) by mouth at bedtime. 06/25/15  Yes Kathrynn Ducking, MD  pregabalin (LYRICA) 25 MG capsule Take 1 capsule (25 mg total) by mouth 2 (two) times daily. Patient taking differently: Take 25 mg by mouth 2 (two) times daily. Morning and with  lunch 06/25/15  Yes Kathrynn Ducking, MD  cyclobenzaprine (FLEXERIL) 10 MG tablet Take 1 tablet (10 mg total) by mouth 3 (three) times daily as needed for muscle spasms. 11/04/15   Jola Schmidt, MD  HYDROcodone-acetaminophen (NORCO/VICODIN) 5-325 MG tablet Take 1 tablet by mouth every 4 (four) hours as needed for moderate pain. 11/04/15   Jola Schmidt, MD    Family History Family History  Problem Relation Age of Onset  . Heart attack Father 3  .  Kidney disease Mother     renal failure  . Throat cancer Sister     smoker  . Osteoporosis Sister   . Heart attack Brother 49  . Stroke Brother 48    smoker  . Depression Maternal Uncle   . Cancer Brother     X3  lung cancer, all smokers  . Peripheral vascular disease Daughter   . Colon cancer Neg Hx   . Diabetes Neg Hx     Social History Social History  Substance Use Topics  . Smoking status: Never Smoker  . Smokeless tobacco: Never Used  .  Alcohol use 1.8 oz/week    3 Glasses of wine per week     Comment: wine occasionally     Allergies   Codeine; Duloxetine; and Sertraline hcl   Review of Systems Review of Systems  Musculoskeletal: Positive for back pain.  All other systems reviewed and are negative.    Physical Exam Updated Vital Signs BP 149/60   Pulse 75   Temp 98.6 F (37 C) (Oral)   Resp 18   SpO2 95%   Physical Exam  Constitutional: She is oriented to person, place, and time. She appears well-developed and well-nourished. No distress.  HENT:  Head: Normocephalic and atraumatic.  Eyes: EOM are normal.  Neck: Normal range of motion.  Cardiovascular: Normal rate, regular rhythm and normal heart sounds.   Pulmonary/Chest: Effort normal and breath sounds normal.  Abdominal: Soft. She exhibits no distension. There is no tenderness.  Musculoskeletal: Normal range of motion.  Full range of motion of bilateral hips.  Normal strength in bilateral lower extremity major muscle groups.  Normal pulses in both feet.  No thoracic tenderness.  Patient with midline lumbar tenderness without lumbar step-off.  Patient also with mild paralumbar tenderness and spasm  Neurological: She is alert and oriented to person, place, and time.  Skin: Skin is warm and dry.  Psychiatric: She has a normal mood and affect. Judgment normal.  Nursing note and vitals reviewed.    ED Treatments / Results  Labs (all labs ordered are listed, but only abnormal results are  displayed) Labs Reviewed  BASIC METABOLIC PANEL - Abnormal; Notable for the following:       Result Value   Glucose, Bld 106 (*)    Creatinine, Ser 1.66 (*)    GFR calc non Af Amer 28 (*)    GFR calc Af Amer 32 (*)    All other components within normal limits  URINALYSIS, ROUTINE W REFLEX MICROSCOPIC (NOT AT [facility])  CBC    EKG  EKG Interpretation None       Radiology Dg Lumbar Spine Complete  Result Date: 11/04/2015 CLINICAL DATA:  Worsening pain in both hips and low back for the past 3 days with no known trauma. EXAM: LUMBAR SPINE - COMPLETE 4+ VIEW COMPARISON:  Lumbar spine MRI 09/04/2014 and radiographs 08/28/2014 FINDINGS: There are 5 non rib-bearing lumbar type vertebral bodies. Mild lumbar dextroscoliosis is again seen. No significant listhesis is seen. Postoperative changes from prior laminectomies are again noted. There is a new L1 inferior endplate fracture with mild vertebral body height loss asymmetric to the left. Moderate disc space narrowing and degenerative spurring are again seen from L2-3 to L5-S1. IMPRESSION: New L1 inferior endplate fracture with mild height loss. Electronically Signed   By: Logan Bores M.D.   On: 11/04/2015 09:27   Dg Pelvis 1-2 Views  Result Date: 11/04/2015 CLINICAL DATA:  Worsening pain in both hips and low back for the past 3 days with no known trauma. EXAM: PELVIS - 1-2 VIEW COMPARISON:  08/02/2014 right hip and pelvic radiographs from [place]: A right total hip arthroplasty is again seen. The distal aspect of the femoral stem was not imaged. Mild heterotopic ossification about the right hip. There is no evidence of acute fracture or dislocation on this single AP image. IMPRESSION: No acute osseous abnormality identified. Prior right hip arthroplasty. Electronically Signed   By: Logan Bores M.D.   On: 11/04/2015 09:19    Procedures Procedures (including critical care time)  Medications Ordered  in ED Medications   ketorolac (TORADOL) 30 MG/ML injection 30 mg (30 mg Intravenous Given 11/04/15 1001)  morphine 4 MG/ML injection 4 mg (4 mg Intravenous Given 11/04/15 1002)  methocarbamol (ROBAXIN) tablet 500 mg (500 mg Oral Given 11/04/15 1002)     Initial Impression / Assessment and Plan / ED Course  I have reviewed the triage vital signs and the nursing notes.  Pertinent labs & imaging results that were available during my care of the patient were reviewed by me and considered in my medical decision making (see chart for details).  Clinical Course    Patient feels better after treatment of pain in the emergency department.  X-rays do demonstrate a new L1 compression fracture which likely is the cause of her back pain.  She'll be treated with pain medication.  Hip arthroplasty appears intact.  Full range of motion bilateral hips.  Doubt septic arthritis.  Doubt deep space infection.  Primary care and orthopedic follow-up.  She understands to return to the ER for new or worsening symptoms.  Final Clinical Impressions(s) / ED Diagnoses   Final diagnoses:  Lumbar compression fracture, closed, initial encounter ([place])    New Prescriptions New Prescriptions   CYCLOBENZAPRINE (FLEXERIL) 10 MG TABLET    Take 1 tablet (10 mg total) by mouth 3 (three) times daily as needed for muscle spasms.   HYDROCODONE-ACETAMINOPHEN (NORCO/VICODIN) 5-325 MG TABLET    Take 1 tablet by mouth every 4 (four) hours as needed for moderate pain.     Jola Schmidt, MD 11/04/15 [phone]

## 2015-11-04 NOTE — ED Notes (Signed)
PT have been made aware of urine sample 

## 2015-11-04 NOTE — ED Notes (Signed)
Pt taken to bathroom via stedy

## 2015-11-04 NOTE — ED Triage Notes (Signed)
Patient c/o right and left hip pain since Friday. Patient states that she has had hip replacement before. Patient called Dr Jacquiline Doe office but can't be seen until Aug 11th.  Pain worse with getting in and out of the wheelchair.

## 2015-11-04 NOTE — ED Notes (Signed)
Pt taken to bathroom on stedy.

## 2015-11-05 ENCOUNTER — Ambulatory Visit: Payer: Medicare Other | Admitting: Cardiology

## 2015-11-11 ENCOUNTER — Ambulatory Visit (INDEPENDENT_AMBULATORY_CARE_PROVIDER_SITE_OTHER): Payer: Medicare Other | Admitting: Family Medicine

## 2015-11-11 ENCOUNTER — Encounter: Payer: Self-pay | Admitting: Family Medicine

## 2015-11-11 VITALS — BP 140/78 | HR 83 | Temp 97.8°F | Resp 18 | Ht 65.0 in | Wt 182.0 lb

## 2015-11-11 DIAGNOSIS — M545 Low back pain: Secondary | ICD-10-CM | POA: Diagnosis not present

## 2015-11-11 DIAGNOSIS — M5440 Lumbago with sciatica, unspecified side: Secondary | ICD-10-CM | POA: Diagnosis not present

## 2015-11-11 DIAGNOSIS — S32000S Wedge compression fracture of unspecified lumbar vertebra, sequela: Secondary | ICD-10-CM | POA: Diagnosis not present

## 2015-11-11 DIAGNOSIS — S32010A Wedge compression fracture of first lumbar vertebra, initial encounter for closed fracture: Secondary | ICD-10-CM | POA: Diagnosis not present

## 2015-11-11 MED ORDER — KETOROLAC TROMETHAMINE 60 MG/2ML IM SOLN
60.0000 mg | Freq: Once | INTRAMUSCULAR | Status: AC
  Administered 2015-11-11: 60 mg via INTRAMUSCULAR

## 2015-11-11 NOTE — Progress Notes (Signed)
Patient ID: Krystal Henderson, female    DOB: 26-Sep-1932  Age: 82 y.o. MRN: [id number]    Subjective:  Subjective  HPI Krystal Henderson presents for low back pain and f/u from ER at [facility] 8/1---  Review of Systems  Constitutional: Negative for appetite change, diaphoresis, fatigue and unexpected weight change.  Eyes: Negative for pain, redness and visual disturbance.  Respiratory: Negative for cough, chest tightness, shortness of breath and wheezing.   Cardiovascular: Negative for chest pain, palpitations and leg swelling.  Endocrine: Negative for cold intolerance, heat intolerance, polydipsia, polyphagia and polyuria.  Genitourinary: Negative for difficulty urinating, dysuria and frequency.  Musculoskeletal: Positive for back pain and gait problem.  Neurological: Positive for weakness. Negative for dizziness, light-headedness, numbness and headaches.    History Past Medical History:  Diagnosis Date  . Anemia    hx of   . Angiodysplasia 2007   @ colonoscopy  . Anxiety    PMH of  . Chronic low back pain 06/21/2014  . Diverticulosis of colon (without mention of hemorrhage)   . DJD (degenerative joint disease)   . Esophageal reflux    inactive  . HTN (hypertension)   . Hyperlipemia 2006   LDL 130  . Hypoglycemia    reactive  . Pelvic fracture (Krystal Henderson) 12/30/2011   [facility]  . Peripheral neuropathy (Krystal Henderson)   . Personal history of colonic polyps    adenomatous  . Pneumonia 2012   hx of pneumonia   . Vitamin B12 deficiency     She has a past surgical history that includes Tear duct probing; Total hip arthroplasty (2000); Facial cosmetic surgery; Tubal ligation; Hemorroidectomy; Dilation and curettage of uterus; Colonoscopy w/ polypectomy; Lumbar laminectomy/decompression microdiscectomy (09/10/2011); ORIF wrist fracture (01/11/2012); cataract surgery (12-26-12 and 01-09-13); and Colonoscopy (2011).   Her family history includes Cancer in her brother; Depression in her maternal  uncle; Heart attack (age of onset: 12) in her brother; Heart attack (age of onset: 40) in her father; Kidney disease in her mother; Osteoporosis in her sister; Peripheral vascular disease in her daughter; Stroke (age of onset: 37) in her brother; Throat cancer in her sister.She reports that she has never smoked. She has never used smokeless tobacco. She reports that she drinks about 1.8 oz of alcohol per week . She reports that she does not use drugs.  Current Outpatient Prescriptions on File Prior to Visit  Medication Sig Dispense Refill  . ALPRAZolam (XANAX) 0.25 MG tablet 1 every 8-12 hrs prn , not routinely (Patient taking differently: 0.25-0.5 mg 2 (two) times daily as needed for anxiety or sleep. ) 30 tablet 0  . amLODipine (NORVASC) 5 MG tablet take 1 tablet by mouth once daily (Patient taking differently: take 1 tablet by mouth once daily in the morning) 30 tablet 5  . Ascorbic Acid (VITAMIN C) 1000 MG tablet Take 1,000 mg by mouth daily.     [name] aspirin 81 MG tablet Take 81 mg by mouth daily.     . Calcium Carbonate (CALCIUM 600 PO) Take 1 capsule by mouth daily.     . Cholecalciferol (VITAMIN D) 2000 UNITS CAPS Take 1 capsule by mouth daily.     . cyclobenzaprine (FLEXERIL) 10 MG tablet Take 1 tablet (10 mg total) by mouth 3 (three) times daily as needed for muscle spasms. 12 tablet 0  . furosemide (LASIX) 20 MG tablet take 1 tablet by mouth once daily (Patient taking differently: take 1 tablet by mouth once daily in the  morning) 30 tablet 5  . HYDROcodone-acetaminophen (NORCO/VICODIN) 5-325 MG tablet Take 1 tablet by mouth every 4 (four) hours as needed for moderate pain. 15 tablet 0  . losartan (COZAAR) 100 MG tablet take 1 tablet by mouth once daily (Patient taking differently: take 1 tablet by mouth once daily in the morning) 30 tablet 4  . meclizine (ANTIVERT) 25 MG tablet take 1/2 to 1 tablet by mouth once daily 30 tablet 0  . Multiple Vitamin (MULTIVITAMIN WITH MINERALS) TABS Take 1  tablet by mouth daily.    . Omega-3 Fatty Acids (FISH OIL) 1000 MG CAPS Take 1 capsule by mouth daily.     Krystal Henderson Glycol-Propyl Glycol (SYSTANE ULTRA OP) Apply 1 drop to eye daily as needed (for dry eyes).    . polyethylene glycol (MIRALAX / GLYCOLAX) packet Take 17 g by mouth daily. 10 each 0  . pregabalin (LYRICA) 100 MG capsule Take 1 capsule (100 mg total) by mouth at bedtime. 30 capsule 5  . pregabalin (LYRICA) 25 MG capsule Take 1 capsule (25 mg total) by mouth 2 (two) times daily. (Patient taking differently: Take 25 mg by mouth 2 (two) times daily. Morning and with  lunch) 60 capsule 5   No current facility-administered medications on file prior to visit.      Objective:  Objective  Physical Exam  Neurological: She is alert.  Pt in wheelchair-- in too much pain to be examined Er records reviewed  Nursing note and vitals reviewed.  BP 140/78 (BP Location: Left Arm, Patient Position: Sitting, Cuff Size: Large)   Pulse 83   Temp 97.8 F (36.6 C) (Oral)   Resp 18   Ht 5\' 5"  (1.651 m)   Wt 182 lb (82.6 kg)   SpO2 92%   BMI 30.29 kg/m  Wt Readings from Last 3 Encounters:  11/11/15 182 lb (82.6 kg)  10/23/15 182 lb 6 oz (82.7 kg)  07/22/15 184 lb 6.4 oz (83.6 kg)     Lab Results  Component Value Date   WBC 7.1 11/04/2015   HGB 12.9 11/04/2015   HCT 39.9 11/04/2015   PLT 305 11/04/2015   GLUCOSE 106 (H) 11/04/2015   CHOL 223 (H) 07/22/2015   TRIG 145.0 07/22/2015   HDL 74.90 07/22/2015   LDLDIRECT 129.0 10/18/2014   LDLCALC 119 (H) 07/22/2015   ALT 13 07/22/2015   AST 15 07/22/2015   NA 139 11/04/2015   K 4.4 11/04/2015   CL 105 11/04/2015   CREATININE 1.66 (H) 11/04/2015   BUN 20 11/04/2015   CO2 26 11/04/2015   TSH 3.53 11/26/2013   INR 1.3 ratio (H) 10/29/2009   HGBA1C 5.6 08/24/2011    Dg Lumbar Spine Complete  Result Date: 11/04/2015 CLINICAL DATA:  Worsening pain in both hips and low back for the past 3 days with no known trauma. EXAM: LUMBAR  SPINE - COMPLETE 4+ VIEW COMPARISON:  Lumbar spine MRI 09/04/2014 and radiographs 08/28/2014 FINDINGS: There are 5 non rib-bearing lumbar type vertebral bodies. Mild lumbar dextroscoliosis is again seen. No significant listhesis is seen. Postoperative changes from prior laminectomies are again noted. There is a new L1 inferior endplate fracture with mild vertebral body height loss asymmetric to the left. Moderate disc space narrowing and degenerative spurring are again seen from L2-3 to L5-S1. IMPRESSION: New L1 inferior endplate fracture with mild height loss. Electronically Signed   By: Logan Bores M.D.   On: 11/04/2015 09:27   Dg Pelvis 1-2 Views  Result  Date: 11/04/2015 CLINICAL DATA:  Worsening pain in both hips and low back for the past 3 days with no known trauma. EXAM: PELVIS - 1-2 VIEW COMPARISON:  08/02/2014 right hip and pelvic radiographs from [place]: A right total hip arthroplasty is again seen. The distal aspect of the femoral stem was not imaged. Mild heterotopic ossification about the right hip. There is no evidence of acute fracture or dislocation on this single AP image. IMPRESSION: No acute osseous abnormality identified. Prior right hip arthroplasty. Electronically Signed   By: Logan Bores M.D.   On: 11/04/2015 09:19     Assessment & Plan:  Plan  I am having Ms. Streb maintain her aspirin, Calcium Carbonate (CALCIUM 600 PO), Fish Oil, vitamin C, Vitamin D, multivitamin with minerals, polyethylene glycol, meclizine, pregabalin, pregabalin, ALPRAZolam, amLODipine, furosemide, losartan, Polyethyl Glycol-Propyl Glycol (SYSTANE ULTRA OP), HYDROcodone-acetaminophen, and cyclobenzaprine.  No orders of the defined types were placed in this encounter.   Problem List Items Addressed This Visit    None    Visit Diagnoses    Midline low back pain with sciatica, sciatica laterality unspecified    -  Primary   Relevant Orders   Ambulatory referral to Orthopedic  Surgery    toradol inj given today, pt to see ortho 2pm today Xray from er---+vertebral fx- L1 Pt had reaction to hydrocodone/ cyclobenzaprine Er records reviewed  Follow-up: Return if symptoms worsen or fail to improve.  Ann Held, DO

## 2015-11-11 NOTE — Addendum Note (Signed)
Addended by: Ewing Schlein on: 11/11/2015 02:39 PM   Modules accepted: Orders

## 2015-11-11 NOTE — Patient Instructions (Signed)

## 2015-11-14 ENCOUNTER — Telehealth: Payer: Self-pay | Admitting: Family Medicine

## 2015-11-14 NOTE — Telephone Encounter (Signed)
Pt called In to speak with cma because she says that she hasnt' had a bowel movement in 8 days. She would like to be advise on what she should do? Should would like to know if provider could call her in something if possible.     CB: [phone]

## 2015-11-14 NOTE — Telephone Encounter (Signed)
Can use delsym otc or try tessalon perles 200 tid prn #30

## 2015-11-14 NOTE — Telephone Encounter (Signed)
Patient is aware and she said she will make her husband aware so he can pick it up. She will call on Monday if no BM over the weekend. She wants to know what to do about the cough.      KP

## 2015-11-14 NOTE — Telephone Encounter (Signed)
Try mag citrate otc

## 2015-11-14 NOTE — Telephone Encounter (Signed)
Spoke with patient and she has been taking Miralax daily along with a stool softener and still is unable to make a bowel movement x's 8 days, she denied abdominal pain, denied fever, denied bloating, however her abdomen feels hard and she feels bad. She said she also has a cough with white phlegm and sore throat, she thinks this is caused by the brace she has on for the lumbar fracture.  Please advise     KP

## 2015-11-14 NOTE — Telephone Encounter (Signed)
Patient aware and she verbalized understanding, she said she has that thing you can breath into and she said she had delsym that she can take at home.     KP

## 2015-11-25 DIAGNOSIS — S32020S Wedge compression fracture of second lumbar vertebra, sequela: Secondary | ICD-10-CM | POA: Diagnosis not present

## 2015-11-26 DIAGNOSIS — S32000A Wedge compression fracture of unspecified lumbar vertebra, initial encounter for closed fracture: Secondary | ICD-10-CM | POA: Insufficient documentation

## 2015-12-03 ENCOUNTER — Telehealth: Payer: Self-pay | Admitting: Family Medicine

## 2015-12-03 DIAGNOSIS — N2581 Secondary hyperparathyroidism of renal origin: Secondary | ICD-10-CM | POA: Diagnosis not present

## 2015-12-03 DIAGNOSIS — N183 Chronic kidney disease, stage 3 (moderate): Secondary | ICD-10-CM | POA: Diagnosis not present

## 2015-12-03 DIAGNOSIS — D631 Anemia in chronic kidney disease: Secondary | ICD-10-CM | POA: Diagnosis not present

## 2015-12-03 NOTE — Telephone Encounter (Signed)
LM to schedule AWV with RN

## 2015-12-16 ENCOUNTER — Other Ambulatory Visit: Payer: Self-pay | Admitting: Neurology

## 2015-12-16 DIAGNOSIS — G609 Hereditary and idiopathic neuropathy, unspecified: Secondary | ICD-10-CM

## 2015-12-18 ENCOUNTER — Other Ambulatory Visit: Payer: Self-pay | Admitting: Neurology

## 2015-12-18 DIAGNOSIS — G609 Hereditary and idiopathic neuropathy, unspecified: Secondary | ICD-10-CM

## 2015-12-23 ENCOUNTER — Other Ambulatory Visit: Payer: Self-pay | Admitting: Neurology

## 2015-12-23 DIAGNOSIS — G609 Hereditary and idiopathic neuropathy, unspecified: Secondary | ICD-10-CM

## 2015-12-23 DIAGNOSIS — S32020S Wedge compression fracture of second lumbar vertebra, sequela: Secondary | ICD-10-CM | POA: Diagnosis not present

## 2015-12-23 MED ORDER — PREGABALIN 25 MG PO CAPS: 25.0000 mg | ORAL_CAPSULE | Freq: Two times a day (BID) | ORAL | 5 refills | Status: DC

## 2015-12-23 MED ORDER — PREGABALIN 100 MG PO CAPS: 100.0000 mg | ORAL_CAPSULE | Freq: Every day | ORAL | 5 refills | Status: DC

## 2015-12-23 NOTE — Telephone Encounter (Signed)
RXs printed. Pt would like to speak w/ Dr. Jannifer Franklin.

## 2015-12-23 NOTE — Telephone Encounter (Signed)
I called the patient back, she indicates that she is not getting the prescription early , she just cannot get the prescription filled. She is active on my chart, she could contact me directly to get the prescription filled.

## 2015-12-23 NOTE — Telephone Encounter (Signed)
I called patient. The patient indicates that she called our office one week ago requesting the Lyrica prescription. The Lyrica prescription was just written today, not sure why there was a 1 week delay.

## 2015-12-23 NOTE — Telephone Encounter (Signed)
Spoke to pt's pharmacy. Pt has already used her 6 months of refills as she has been getting rx filled early each month. Last month she filled meds on 11/17/15. New scripts w/ refills faxed to pharmacy. Each month's rx should last for 30 days.

## 2015-12-23 NOTE — Telephone Encounter (Signed)
Patient requesting refill of pregabalin (LYRICA) 100 MG capsule , pregabalin (LYRICA) 25 MG capsule Pharmacy: [place], [place]  Pt requests to speak with Dr. Jannifer Franklin as well.

## 2016-01-13 ENCOUNTER — Encounter: Payer: Self-pay | Admitting: Neurology

## 2016-01-13 ENCOUNTER — Ambulatory Visit: Payer: Medicare Other | Admitting: Neurology

## 2016-01-13 VITALS — BP 138/71 | HR 69 | Ht 65.0 in | Wt 185.5 lb

## 2016-01-13 DIAGNOSIS — G609 Hereditary and idiopathic neuropathy, unspecified: Secondary | ICD-10-CM | POA: Diagnosis not present

## 2016-01-13 DIAGNOSIS — G8929 Other chronic pain: Secondary | ICD-10-CM | POA: Diagnosis not present

## 2016-01-13 DIAGNOSIS — M545 Low back pain: Secondary | ICD-10-CM

## 2016-01-13 NOTE — Progress Notes (Signed)
Reason for visit: Peripheral neuropathy  Krystal Henderson is an 82 y.o. female  History of present illness:  Krystal Henderson is an 82 year old right-handed white female with a history of chronic low back pain and a peripheral neuropathy. The patient had a spontaneous L1 compression fracture around 11/04/2015. The patient has been wearing a back support brace, but she still has ongoing discomfort. The pain stays in the back and does not radiate down the legs. The patient has not had any falls, she uses a walker for ambulation. She has had a lot of problems with peripheral edema of both legs, she is on amlodipine and Lyrica. The swelling of the legs does not go down overnight. She has Tylenol for pain, she no longer has hydrocodone.  Past Medical History:  Diagnosis Date  . Anemia    hx of   . Angiodysplasia 2007   @ colonoscopy  . Anxiety    PMH of  . Chronic low back pain 06/21/2014  . Diverticulosis of colon (without mention of hemorrhage)   . DJD (degenerative joint disease)   . Esophageal reflux    inactive  . HTN (hypertension)   . Hyperlipemia 2006   LDL 130  . Hypoglycemia    reactive  . Pelvic fracture ([place]) 12/30/2011   [facility]  . Peripheral neuropathy ([place])   . Personal history of colonic polyps    adenomatous  . Pneumonia 2012   hx of pneumonia   . Vitamin B12 deficiency     Past Surgical History:  Procedure Laterality Date  . cataract surgery  12-26-12 and 01-09-13  . COLONOSCOPY  2011   neg  . COLONOSCOPY W/ POLYPECTOMY     X 2 , Dr  Verl Blalock; angiodysplasia. Due 2022  . DILATION AND CURETTAGE OF UTERUS    . FACIAL COSMETIC SURGERY    . HEMORROIDECTOMY    . LUMBAR LAMINECTOMY/DECOMPRESSION MICRODISCECTOMY  09/10/2011   Procedure: LUMBAR LAMINECTOMY/DECOMPRESSION MICRODISCECTOMY;  Surgeon: Johnn Hai, MD;  Location: [facility];  Service: Orthopedics;  Laterality: N/A;  decompression laminectomy L2-3, L3-4, L4-5  . ORIF WRIST FRACTURE  01/11/2012     Procedure: OPEN REDUCTION INTERNAL FIXATION (ORIF) WRIST FRACTURE;  Surgeon: Tennis Must, MD;  Location: [facility];  Service: Orthopedics;  Laterality: Right;  . TEAR DUCT PROBING     X 2  . TOTAL HIP ARTHROPLASTY  2000   right  . TUBAL LIGATION      Family History  Problem Relation Age of Onset  . Heart attack Father 10  . Kidney disease Mother     renal failure  . Throat cancer Sister     smoker  . Osteoporosis Sister   . Heart attack Brother 63  . Stroke Brother 72    smoker  . Depression Maternal Uncle   . Cancer Brother     X3  lung cancer, all smokers  . Peripheral vascular disease Daughter   . Colon cancer Neg Hx   . Diabetes Neg Hx     Social history:  reports that she has never smoked. She has never used smokeless tobacco. She reports that she drinks about 1.8 oz of alcohol per week . She reports that she does not use drugs.    Allergies  Allergen Reactions  . Codeine Nausea Only  . Duloxetine Other (See Comments)    REACTION: intolerance--She does not remember taking this Rx- states she did not feel well   . Sertraline  Hcl Other (See Comments)    REACTION: intolerance-- Did not help with Depression    Medications:  Prior to Admission medications   Medication Sig Start Date End Date Taking? Authorizing Provider  ALPRAZolam (XANAX) 0.25 MG tablet 1 every 8-12 hrs prn , not routinely Patient taking differently: 0.25-0.5 mg 2 (two) times daily as needed for anxiety or sleep.  07/22/15  Yes Alferd Apa Lowne Chase, DO  amLODipine (NORVASC) 5 MG tablet take 1 tablet by mouth once daily Patient taking differently: take 1 tablet by mouth once daily in the morning 08/25/15  Yes Yvonne R Lowne Chase, DO  Ascorbic Acid (VITAMIN C) 1000 MG tablet Take 1,000 mg by mouth daily.    Yes Historical Provider, MD  aspirin 81 MG tablet Take 81 mg by mouth daily.    Yes Historical Provider, MD  Calcium Carbonate (CALCIUM 600 PO) Take 1 capsule by mouth daily.     Yes Historical Provider, MD  Cholecalciferol (VITAMIN D) 2000 UNITS CAPS Take 1 capsule by mouth daily.    Yes Historical Provider, MD  cyclobenzaprine (FLEXERIL) 10 MG tablet Take 1 tablet (10 mg total) by mouth 3 (three) times daily as needed for muscle spasms. 11/04/15  Yes Jola Schmidt, MD  furosemide (LASIX) 20 MG tablet take 1 tablet by mouth once daily Patient taking differently: take 1 tablet by mouth once daily in the morning 08/25/15  Yes Yvonne R Lowne Chase, DO  HYDROcodone-acetaminophen (NORCO/VICODIN) 5-325 MG tablet Take 1 tablet by mouth every 4 (four) hours as needed for moderate pain. 11/04/15  Yes Jola Schmidt, MD  losartan (COZAAR) 100 MG tablet take 1 tablet by mouth once daily Patient taking differently: take 1 tablet by mouth once daily in the morning 09/22/15  Yes Yvonne R Lowne Chase, DO  meclizine (ANTIVERT) 25 MG tablet take 1/2 to 1 tablet by mouth once daily 10/18/14  Yes Ann Held, DO  Multiple Vitamin (MULTIVITAMIN WITH MINERALS) TABS Take 1 tablet by mouth daily.   Yes Historical Provider, MD  Omega-3 Fatty Acids (FISH OIL) 1000 MG CAPS Take 1 capsule by mouth daily.    Yes Historical Provider, MD  Polyethyl Glycol-Propyl Glycol (SYSTANE ULTRA OP) Apply 1 drop to eye daily as needed (for dry eyes).   Yes Historical Provider, MD  polyethylene glycol (MIRALAX / GLYCOLAX) packet Take 17 g by mouth daily. 01/03/12  Yes Nishant Dhungel, MD  pregabalin (LYRICA) 100 MG capsule Take 1 capsule (100 mg total) by mouth at bedtime. 12/24/15  Yes Kathrynn Ducking, MD  pregabalin (LYRICA) 25 MG capsule Take 1 capsule (25 mg total) by mouth 2 (two) times daily. 12/24/15  Yes Kathrynn Ducking, MD    ROS:  Out of a complete 14 system review of symptoms, the patient complains only of the following symptoms, and all other reviewed systems are negative.  Back pain, walking difficulty Daytime sleepiness, snoring  Blood pressure 138/71, pulse 69, height 5\' 5"  (1.651 m), weight 185  lb 8 oz (84.1 kg).  Physical Exam  General: The patient is alert and cooperative at the time of the examination.  Skin: 2-3+ edema is noted below the knees bilaterally.   Neurologic Exam  Mental status: The patient is alert and oriented x 3 at the time of the examination. The patient has apparent normal recent and remote memory, with an apparently normal attention span and concentration ability.   Cranial nerves: Facial symmetry is present. Speech is normal, no aphasia or dysarthria  is noted. Extraocular movements are full. Visual fields are full.  Motor: The patient has good strength in all 4 extremities.  Sensory examination: Soft touch sensation is symmetric on the face, arms, and legs.  Coordination: The patient has good finger-nose-finger and heel-to-shin bilaterally.  Gait and station: The patient has a slightly wide-based gait, the patient walks with a walker. Romberg is negative. Tandem gait was not attempted.  Reflexes: Deep tendon reflexes are symmetric, but are depressed.   Assessment/Plan:  1. Peripheral neuropathy  2. Chronic low back pain  3. Peripheral edema  The patient is on amlodipine and Lyrica which may promote leg swelling. We may need to reduce the Lyrica dosing. The patient is having a lot of back pain associated with the recent compression fracture. She will continue the Lyrica for now, she will call me if she wishes to go down off the medication. She will follow-up in 6 months.  Jill Alexanders MD 01/13/2016 2:32 PM  [facility] [address] [place], [place] [address]  Phone [phone] Fax [phone]

## 2016-01-20 ENCOUNTER — Ambulatory Visit: Payer: Self-pay | Admitting: Family Medicine

## 2016-01-20 DIAGNOSIS — S32020S Wedge compression fracture of second lumbar vertebra, sequela: Secondary | ICD-10-CM | POA: Diagnosis not present

## 2016-02-03 DIAGNOSIS — S32020S Wedge compression fracture of second lumbar vertebra, sequela: Secondary | ICD-10-CM | POA: Diagnosis not present

## 2016-02-06 DIAGNOSIS — S32020S Wedge compression fracture of second lumbar vertebra, sequela: Secondary | ICD-10-CM | POA: Diagnosis not present

## 2016-02-09 DIAGNOSIS — S32020S Wedge compression fracture of second lumbar vertebra, sequela: Secondary | ICD-10-CM | POA: Diagnosis not present

## 2016-02-11 DIAGNOSIS — S32020S Wedge compression fracture of second lumbar vertebra, sequela: Secondary | ICD-10-CM | POA: Diagnosis not present

## 2016-02-17 DIAGNOSIS — S32020S Wedge compression fracture of second lumbar vertebra, sequela: Secondary | ICD-10-CM | POA: Diagnosis not present

## 2016-02-19 DIAGNOSIS — S32020S Wedge compression fracture of second lumbar vertebra, sequela: Secondary | ICD-10-CM | POA: Diagnosis not present

## 2016-02-20 ENCOUNTER — Ambulatory Visit (INDEPENDENT_AMBULATORY_CARE_PROVIDER_SITE_OTHER): Payer: Medicare Other | Admitting: Family Medicine

## 2016-02-20 ENCOUNTER — Other Ambulatory Visit: Payer: Self-pay | Admitting: Family Medicine

## 2016-02-20 ENCOUNTER — Encounter: Payer: Self-pay | Admitting: Family Medicine

## 2016-02-20 VITALS — BP 132/78 | HR 71 | Temp 97.9°F | Resp 16 | Ht 65.0 in | Wt 180.4 lb

## 2016-02-20 DIAGNOSIS — I10 Essential (primary) hypertension: Secondary | ICD-10-CM | POA: Diagnosis not present

## 2016-02-20 DIAGNOSIS — R600 Localized edema: Secondary | ICD-10-CM | POA: Diagnosis not present

## 2016-02-20 LAB — LIPID PANEL
CHOLESTEROL: 232 mg/dL — AB (ref 0–200)
HDL: 68.5 mg/dL (ref >39.00)
LDL Cholesterol: 137 mg/dL — ABNORMAL HIGH (ref 0–99)
NonHDL: 163.04
TRIGLYCERIDES: 130 mg/dL (ref 0.0–149.0)
Total CHOL/HDL Ratio: 3
VLDL: 26 mg/dL (ref 0.0–40.0)

## 2016-02-20 LAB — CBC WITH DIFFERENTIAL/PLATELET
BASOS ABS: 0 10*3/uL (ref 0.0–0.1)
BASOS PCT: 0.3 % (ref 0.0–3.0)
EOS PCT: 1.2 % (ref 0.0–5.0)
Eosinophils Absolute: 0.1 10*3/uL (ref 0.0–0.7)
HEMATOCRIT: 37.3 % (ref 36.0–46.0)
Hemoglobin: 12.3 g/dL (ref 12.0–15.0)
LYMPHS PCT: 28.8 % (ref 12.0–46.0)
Lymphs Abs: 2.3 10*3/uL (ref 0.7–4.0)
MCHC: 32.8 g/dL (ref 30.0–36.0)
MCV: 93.8 fl (ref 78.0–100.0)
MONOS PCT: 6.5 % (ref 3.0–12.0)
Monocytes Absolute: 0.5 10*3/uL (ref 0.1–1.0)
NEUTROS ABS: 5 10*3/uL (ref 1.4–7.7)
Neutrophils Relative %: 63.2 % (ref 43.0–77.0)
PLATELETS: 373 10*3/uL (ref 150.0–400.0)
RBC: 3.98 Mil/uL (ref 3.87–5.11)
RDW: 15.4 % (ref 11.5–15.5)
WBC: 7.9 10*3/uL (ref 4.0–10.5)

## 2016-02-20 LAB — COMPREHENSIVE METABOLIC PANEL
ALT: 15 U/L (ref 0–35)
AST: 16 U/L (ref 0–37)
Albumin: 4.3 g/dL (ref 3.5–5.2)
Alkaline Phosphatase: 59 U/L (ref 39–117)
BILIRUBIN TOTAL: 0.4 mg/dL (ref 0.2–1.2)
BUN: 21 mg/dL (ref 6–23)
CALCIUM: 10.1 mg/dL (ref 8.4–10.5)
CO2: 27 meq/L (ref 19–32)
Chloride: 104 mEq/L (ref 96–112)
Creatinine, Ser: 1.62 mg/dL — ABNORMAL HIGH (ref 0.40–1.20)
GFR: 32.36 mL/min — AB (ref >60.00)
Glucose, Bld: 94 mg/dL (ref 70–99)
POTASSIUM: 4.4 meq/L (ref 3.5–5.1)
Sodium: 141 mEq/L (ref 135–145)
Total Protein: 7.5 g/dL (ref 6.0–8.3)

## 2016-02-20 MED ORDER — FUROSEMIDE 20 MG PO TABS: 20.0000 mg | ORAL_TABLET | Freq: Every day | ORAL | 5 refills | Status: DC

## 2016-02-20 MED ORDER — LOSARTAN POTASSIUM 100 MG PO TABS: 100.0000 mg | ORAL_TABLET | Freq: Every day | ORAL | 5 refills | Status: DC

## 2016-02-20 MED ORDER — AMLODIPINE BESYLATE 5 MG PO TABS: 5.0000 mg | ORAL_TABLET | Freq: Every day | ORAL | 5 refills | Status: DC

## 2016-02-20 NOTE — Progress Notes (Signed)
Pre visit review using our clinic review tool, if applicable. No additional management support is needed unless otherwise documented below in the visit note. 

## 2016-02-20 NOTE — Patient Instructions (Signed)
Edema Edema is an abnormal buildup of fluids in your bodytissues. Edema is somewhatdependent on gravity to pull the fluid to the lowest place in your body. That makes the condition more common in the legs and thighs (lower extremities). Painless swelling of the feet and ankles is common and becomes more likely as you get older. It is also common in looser tissues, like around your eyes. When the affected area is squeezed, the fluid may move out of that spot and leave a dent for a few moments. This dent is called pitting. What are the causes? There are many possible causes of edema. Eating too much salt and being on your feet or sitting for a long time can cause edema in your legs and ankles. Hot weather may make edema worse. Common medical causes of edema include:  Heart failure.  Liver disease.  Kidney disease.  Weak blood vessels in your legs.  Cancer.  An injury.  Pregnancy.  Some medications.  Obesity.  What are the signs or symptoms? Edema is usually painless.Your skin may look swollen or shiny. How is this diagnosed? Your health care provider may be able to diagnose edema by asking about your medical history and doing a physical exam. You may need to have tests such as X-rays, an electrocardiogram, or blood tests to check for medical conditions that may cause edema. How is this treated? Edema treatment depends on the cause. If you have heart, liver, or kidney disease, you need the treatment appropriate for these conditions. General treatment may include:  Elevation of the affected body part above the level of your heart.  Compression of the affected body part. Pressure from elastic bandages or support stockings squeezes the tissues and forces fluid back into the blood vessels. This keeps fluid from entering the tissues.  Restriction of fluid and salt intake.  Use of a water pill (diuretic). These medications are appropriate only for some types of edema. They pull fluid  out of your body and make you urinate more often. This gets rid of fluid and reduces swelling, but diuretics can have side effects. Only use diuretics as directed by your health care provider.  Follow these instructions at home:  Keep the affected body part above the level of your heart when you are lying down.  Do not sit still or stand for prolonged periods.  Do not put anything directly under your knees when lying down.  Do not wear constricting clothing or garters on your upper legs.  Exercise your legs to work the fluid back into your blood vessels. This may help the swelling go down.  Wear elastic bandages or support stockings to reduce ankle swelling as directed by your health care provider.  Eat a low-salt diet to reduce fluid if your health care provider recommends it.  Only take medicines as directed by your health care provider. Contact a health care provider if:  Your edema is not responding to treatment.  You have heart, liver, or kidney disease and notice symptoms of edema.  You have edema in your legs that does not improve after elevating them.  You have sudden and unexplained weight gain. Get help right away if:  You develop shortness of breath or chest pain.  You cannot breathe when you lie down.  You develop pain, redness, or warmth in the swollen areas.  You have heart, liver, or kidney disease and suddenly get edema.  You have a fever and your symptoms suddenly get worse. This information is   not intended to replace advice given to you by your health care provider. Make sure you discuss any questions you have with your health care provider. Document Released: 03/22/2005 Document Revised: 08/28/2015 Document Reviewed: 01/12/2013 Elsevier Interactive Patient Education  2017 Elsevier Inc.  

## 2016-02-20 NOTE — Progress Notes (Signed)
Patient ID: Krystal Henderson, female    DOB: 02/14/1933  Age: 82 y.o. MRN: [id number]    Subjective:  Subjective  HPI [name] presents for f/u back pain.  She is doing much better.  She is out of the brace and in PT.   The brace was causing her legs to swell.  Now that she is out of it --swelling has improved dramatically.    Review of Systems  Constitutional: Negative for appetite change, diaphoresis, fatigue and unexpected weight change.  Eyes: Negative for pain, redness and visual disturbance.  Respiratory: Negative for cough, chest tightness, shortness of breath and wheezing.   Cardiovascular: Negative for chest pain, palpitations and leg swelling.  Endocrine: Negative for cold intolerance, heat intolerance, polydipsia, polyphagia and polyuria.  Genitourinary: Negative for difficulty urinating, dysuria and frequency.  Neurological: Negative for dizziness, light-headedness, numbness and headaches.    History Past Medical History:  Diagnosis Date  . Anemia    hx of   . Angiodysplasia 2007   @ colonoscopy  . Anxiety    PMH of  . Chronic low back pain 06/21/2014  . Diverticulosis of colon (without mention of hemorrhage)   . DJD (degenerative joint disease)   . Esophageal reflux    inactive  . HTN (hypertension)   . Hyperlipemia 2006   LDL 130  . Hypoglycemia    reactive  . Pelvic fracture ([place]) 12/30/2011   [facility]  . Peripheral neuropathy ([place])   . Personal history of colonic polyps    adenomatous  . Pneumonia 2012   hx of pneumonia   . Vitamin B12 deficiency     She has a past surgical history that includes Tear duct probing; Total hip arthroplasty (2000); Facial cosmetic surgery; Tubal ligation; Hemorroidectomy; Dilation and curettage of uterus; Colonoscopy w/ polypectomy; Lumbar laminectomy/decompression microdiscectomy (09/10/2011); ORIF wrist fracture (01/11/2012); cataract surgery (12-26-12 and 01-09-13); and Colonoscopy (2011).   Her family history  includes Cancer in her brother; Depression in her maternal uncle; Heart attack (age of onset: 53) in her brother; Heart attack (age of onset: 76) in her father; Kidney disease in her mother; Osteoporosis in her sister; Peripheral vascular disease in her daughter; Stroke (age of onset: 69) in her brother; Throat cancer in her sister.She reports that she has never smoked. She has never used smokeless tobacco. She reports that she drinks about 1.8 oz of alcohol per week . She reports that she does not use drugs.  Current Outpatient Prescriptions on File Prior to Visit  Medication Sig Dispense Refill  . ALPRAZolam (XANAX) 0.25 MG tablet 1 every 8-12 hrs prn , not routinely (Patient taking differently: 0.25-0.5 mg 2 (two) times daily as needed for anxiety or sleep. ) 30 tablet 0  . Ascorbic Acid (VITAMIN C) 1000 MG tablet Take 1,000 mg by mouth daily.     [name] aspirin 81 MG tablet Take 81 mg by mouth daily.     . Calcium Carbonate (CALCIUM 600 PO) Take 1 capsule by mouth daily.     . Cholecalciferol (VITAMIN D) 2000 UNITS CAPS Take 1 capsule by mouth daily.     . cyclobenzaprine (FLEXERIL) 10 MG tablet Take 1 tablet (10 mg total) by mouth 3 (three) times daily as needed for muscle spasms. 12 tablet 0  . HYDROcodone-acetaminophen (NORCO/VICODIN) 5-325 MG tablet Take 1 tablet by mouth every 4 (four) hours as needed for moderate pain. 15 tablet 0  . meclizine (ANTIVERT) 25 MG tablet take 1/2 to 1  tablet by mouth once daily 30 tablet 0  . Multiple Vitamin (MULTIVITAMIN WITH MINERALS) TABS Take 1 tablet by mouth daily.    . Omega-3 Fatty Acids (FISH OIL) 1000 MG CAPS Take 1 capsule by mouth daily.     [name] Glycol-Propyl Glycol (SYSTANE ULTRA OP) Apply 1 drop to eye daily as needed (for dry eyes).    . polyethylene glycol (MIRALAX / GLYCOLAX) packet Take 17 g by mouth daily. 10 each 0  . pregabalin (LYRICA) 100 MG capsule Take 1 capsule (100 mg total) by mouth at bedtime. 30 capsule 5  . pregabalin  (LYRICA) 25 MG capsule Take 1 capsule (25 mg total) by mouth 2 (two) times daily. 60 capsule 5   No current facility-administered medications on file prior to visit.      Objective:  Objective  Physical Exam  Constitutional: She is oriented to person, place, and time. She appears well-developed and well-nourished.  HENT:  Head: Normocephalic and atraumatic.  Eyes: Conjunctivae and EOM are normal.  Neck: Normal range of motion. Neck supple. No JVD present. Carotid bruit is not present. No thyromegaly present.  Cardiovascular: Normal rate, regular rhythm and normal heart sounds.   No murmur heard. Pulmonary/Chest: Effort normal and breath sounds normal. No respiratory distress. She has no wheezes. She has no rales. She exhibits no tenderness.  Musculoskeletal: She exhibits tenderness. She exhibits no edema.       Arms: Neurological: She is alert and oriented to person, place, and time.  Psychiatric: She has a normal mood and affect. Her behavior is normal. Judgment and thought content normal.  Nursing note and vitals reviewed.  BP 132/78 (BP Location: Left Arm, Patient Position: Sitting, Cuff Size: Large)   Pulse 71   Temp 97.9 F (36.6 C) (Oral)   Resp 16   Ht 5\' 5"  (1.651 m)   Wt 180 lb 6.4 oz (81.8 kg)   SpO2 98%   BMI 30.02 kg/m  Wt Readings from Last 3 Encounters:  02/20/16 180 lb 6.4 oz (81.8 kg)  01/13/16 185 lb 8 oz (84.1 kg)  11/11/15 182 lb (82.6 kg)     Lab Results  Component Value Date   WBC 7.9 02/20/2016   HGB 12.3 02/20/2016   HCT 37.3 02/20/2016   PLT 373.0 02/20/2016   GLUCOSE 94 02/20/2016   CHOL 232 (H) 02/20/2016   TRIG 130.0 02/20/2016   HDL 68.50 02/20/2016   LDLDIRECT 129.0 10/18/2014   LDLCALC 137 (H) 02/20/2016   ALT 15 02/20/2016   AST 16 02/20/2016   NA 141 02/20/2016   K 4.4 02/20/2016   CL 104 02/20/2016   CREATININE 1.62 (H) 02/20/2016   BUN 21 02/20/2016   CO2 27 02/20/2016   TSH 3.53 11/26/2013   INR 1.3 ratio (H) 10/29/2009    HGBA1C 5.6 08/24/2011    Dg Lumbar Spine Complete  Result Date: 11/04/2015 CLINICAL DATA:  Worsening pain in both hips and low back for the past 3 days with no known trauma. EXAM: LUMBAR SPINE - COMPLETE 4+ VIEW COMPARISON:  Lumbar spine MRI 09/04/2014 and radiographs 08/28/2014 FINDINGS: There are 5 non rib-bearing lumbar type vertebral bodies. Mild lumbar dextroscoliosis is again seen. No significant listhesis is seen. Postoperative changes from prior laminectomies are again noted. There is a new L1 inferior endplate fracture with mild vertebral body height loss asymmetric to the left. Moderate disc space narrowing and degenerative spurring are again seen from L2-3 to L5-S1. IMPRESSION: New L1 inferior endplate fracture  with mild height loss. Electronically Signed   By: Logan Bores M.D.   On: 11/04/2015 09:27   Dg Pelvis 1-2 Views  Result Date: 11/04/2015 CLINICAL DATA:  Worsening pain in both hips and low back for the past 3 days with no known trauma. EXAM: PELVIS - 1-2 VIEW COMPARISON:  08/02/2014 right hip and pelvic radiographs from [facility]: A right total hip arthroplasty is again seen. The distal aspect of the femoral stem was not imaged. Mild heterotopic ossification about the right hip. There is no evidence of acute fracture or dislocation on this single AP image. IMPRESSION: No acute osseous abnormality identified. Prior right hip arthroplasty. Electronically Signed   By: Logan Bores M.D.   On: 11/04/2015 09:19     Assessment & Plan:  Plan  I have changed Ms. Kirstein's amLODipine, furosemide, and losartan. I am also having her maintain her aspirin, Calcium Carbonate (CALCIUM 600 PO), Fish Oil, vitamin C, Vitamin D, multivitamin with minerals, polyethylene glycol, meclizine, ALPRAZolam, Polyethyl Glycol-Propyl Glycol (SYSTANE ULTRA OP), HYDROcodone-acetaminophen, cyclobenzaprine, pregabalin, and pregabalin.  Meds ordered this encounter  Medications  . amLODipine  (NORVASC) 5 MG tablet    Sig: Take 1 tablet (5 mg total) by mouth daily.    Dispense:  30 tablet    Refill:  5  . furosemide (LASIX) 20 MG tablet    Sig: Take 1 tablet (20 mg total) by mouth daily.    Dispense:  30 tablet    Refill:  5  . losartan (COZAAR) 100 MG tablet    Sig: Take 1 tablet (100 mg total) by mouth daily.    Dispense:  30 tablet    Refill:  5    Problem List Items Addressed This Visit      Unprioritized   Essential hypertension - Primary   Relevant Medications   amLODipine (NORVASC) 5 MG tablet   furosemide (LASIX) 20 MG tablet   losartan (COZAAR) 100 MG tablet   Other Relevant Orders   Lipid panel (Completed)   CBC with Differential/Platelet (Completed)   Comprehensive metabolic panel (Completed)   Lower extremity edema   Relevant Medications   furosemide (LASIX) 20 MG tablet   Other Relevant Orders   Lipid panel (Completed)   CBC with Differential/Platelet (Completed)   Comprehensive metabolic panel (Completed)      Follow-up: Return in about 6 months (around 08/19/2016) for annual exam, fasting.  Ann Held, DO

## 2016-02-24 DIAGNOSIS — S32020S Wedge compression fracture of second lumbar vertebra, sequela: Secondary | ICD-10-CM | POA: Diagnosis not present

## 2016-03-01 DIAGNOSIS — S32020S Wedge compression fracture of second lumbar vertebra, sequela: Secondary | ICD-10-CM | POA: Diagnosis not present

## 2016-03-04 DIAGNOSIS — S32020S Wedge compression fracture of second lumbar vertebra, sequela: Secondary | ICD-10-CM | POA: Diagnosis not present

## 2016-03-22 DIAGNOSIS — S32020S Wedge compression fracture of second lumbar vertebra, sequela: Secondary | ICD-10-CM | POA: Diagnosis not present

## 2016-03-30 ENCOUNTER — Telehealth: Payer: Self-pay | Admitting: Family Medicine

## 2016-03-30 NOTE — Telephone Encounter (Signed)
Patient called stating that Dr. Rolena Infante, her orthopedic doctor, would like her to start a medication for osteoporosis. Please advise   Patient phone: [phone]

## 2016-03-31 NOTE — Telephone Encounter (Signed)
Defer to her PCP who is back in the office tomorrow  Please call pt and let her know that Dr. Etter Sjogren will help her with this questions Dr. Jyl Heinz

## 2016-03-31 NOTE — Telephone Encounter (Signed)
Patient called stating that Dr. Rolena Infante, her orthopedic doctor, would like her to start a medication for osteoporosis. Please advise

## 2016-04-01 NOTE — Telephone Encounter (Signed)
We discussed fosamax with pt when bmd came back--- Varney Biles , please call pt and ask again  If agreeable we will call it in

## 2016-04-01 NOTE — Telephone Encounter (Signed)
We tried to get pt to take fosamax in past---- please call

## 2016-04-07 MED ORDER — ALENDRONATE SODIUM 70 MG PO TABS: 70.0000 mg | ORAL_TABLET | ORAL | 2 refills | Status: DC

## 2016-04-07 NOTE — Addendum Note (Signed)
Addended by: Kem Boroughs D on: 04/07/2016 09:56 AM   Modules accepted: Orders

## 2016-04-07 NOTE — Telephone Encounter (Signed)
Patient agreed to take the fosamax.  Rx sent to pharmacy.

## 2016-05-10 ENCOUNTER — Encounter: Payer: Self-pay | Admitting: Family Medicine

## 2016-05-10 ENCOUNTER — Ambulatory Visit (INDEPENDENT_AMBULATORY_CARE_PROVIDER_SITE_OTHER): Payer: Medicare Other | Admitting: Family Medicine

## 2016-05-10 ENCOUNTER — Telehealth: Payer: Self-pay | Admitting: Family Medicine

## 2016-05-10 ENCOUNTER — Telehealth: Payer: Self-pay | Admitting: Behavioral Health

## 2016-05-10 VITALS — BP 140/70 | HR 81 | Temp 97.6°F | Ht 65.0 in | Wt 183.4 lb

## 2016-05-10 DIAGNOSIS — M810 Age-related osteoporosis without current pathological fracture: Secondary | ICD-10-CM | POA: Diagnosis not present

## 2016-05-10 MED ORDER — RISEDRONATE SODIUM 35 MG PO TABS: 35.0000 mg | ORAL_TABLET | ORAL | 1 refills | Status: DC

## 2016-05-10 NOTE — Patient Instructions (Signed)
Stop taking Fosamax for now.   Let us know if you have problems on the new medication.

## 2016-05-10 NOTE — Telephone Encounter (Signed)
Patient scheduled for an appointment today at 3:00 PM with Dr. Nani Ravens.

## 2016-05-10 NOTE — Telephone Encounter (Signed)
Patient Name: Krystal Henderson  DOB: 1933-02-04    Initial Comment [name] states that she has been taking a medication and she is reacting to it. She is having heart burn, and feels like something is stuck in her throat. She has swelling in her feet and her ankles. She is having shortness of breathe when she gets up to do something.    Nurse Assessment  Nurse: Raphael Gibney, RN, Vanita Ingles Date/Time (Eastern Time): 05/10/2016 11:49:59 AM  Confirm and document reason for call. If symptomatic, describe symptoms. ---[name] states she started fosamax about a month ago. she has been having heart burn. Feels like there is something in her throat like she wants to burp. No difficulty swallowing. She is having swelling in her feet and ankles. She has SOB when she gets up to do something.  Does the patient have any new or worsening symptoms? ---Yes  Will a triage be completed? ---Yes  Related visit to physician within the last 2 weeks? ---No  Does the PT have any chronic conditions? (i.e. diabetes, asthma, etc.) ---Yes  List chronic conditions. ---osteoporosis  Is this a behavioral health or substance abuse call? ---No     Guidelines    Guideline Title Affirmed Question Affirmed Notes  Leg Swelling and Edema [1] Difficulty breathing with exertion (e.g., walking) AND [2] new onset or worsening    Final Disposition User   Go to ED Now (or PCP triage) Raphael Gibney, RN, Vera    Comments  Pt states she can not come to the office today as she does not have transportation and does not want to go to urgent care.  called office and spoke to and gave report that pt started fosamax about a month ago. has been having heart burn and feels like something is in her throat like she wants to burp but no difficulty swallowing. She has swelling in her feet and ankles with SOB on exertion  called office and spoke to Dawson and gave report that pt started on fosamax about a month ago. Has been having heartburn and feels like something is  in her throat. She has swelling in her feet and ankles and she has SOB on exertion. Triage outcome of go to ER now (or PCP triage). Pt states she does not have transportation to come to the office or go anywhere else.   Referrals  GO TO FACILITY REFUSED   Disagree/Comply: Disagree  Disagree/Comply Reason: Unable to find transportation

## 2016-05-10 NOTE — Progress Notes (Signed)
Chief Complaint  Patient presents with  . Medication side effects    pt states having problems with Fosamax    Subjective: Patient is a 83 y.o. female here for drug problem.  Started on Fosamax for osteoporosis 4 weeks ago. Started having bad reflux around 2 weeks into taking the medication. She does take supplemental Vit D and Ca. Her last Dexa was 05/2014 that did confirm osteoporosis. She has never been on treatment before. She does not report any other fragility fractures. She is not on chronic steroids.  ROS: GI: +reflux  Family History  Problem Relation Age of Onset  . Heart attack Father 87  . Kidney disease Mother     renal failure  . Throat cancer Sister     smoker  . Osteoporosis Sister   . Heart attack Brother 3  . Stroke Brother 74    smoker  . Depression Maternal Uncle   . Cancer Brother     X3  lung cancer, all smokers  . Peripheral vascular disease Daughter   . Colon cancer Neg Hx   . Diabetes Neg Hx    Past Medical History:  Diagnosis Date  . Anemia    hx of   . Angiodysplasia 2007   @ colonoscopy  . Anxiety    PMH of  . Chronic low back pain 06/21/2014  . Diverticulosis of colon (without mention of hemorrhage)   . DJD (degenerative joint disease)   . Esophageal reflux    inactive  . HTN (hypertension)   . Hyperlipemia 2006   LDL 130  . Hypoglycemia    reactive  . Pelvic fracture ([place]) 12/30/2011   [facility]  . Peripheral neuropathy ([place])   . Personal history of colonic polyps    adenomatous  . Vitamin B12 deficiency    Allergies  Allergen Reactions  . Codeine Nausea Only  . Duloxetine Other (See Comments)    REACTION: intolerance--She does not remember taking this Rx- states she did not feel well   . Sertraline Hcl Other (See Comments)    REACTION: intolerance-- Did not help with Depression    Current Outpatient Prescriptions:  .  ALPRAZolam (XANAX) 0.25 MG tablet, 1 every 8-12 hrs prn , not routinely (Patient taking differently:  0.25-0.5 mg 2 (two) times daily as needed for anxiety or sleep. ), Disp: 30 tablet, Rfl: 0 .  amLODipine (NORVASC) 5 MG tablet, Take 1 tablet (5 mg total) by mouth daily., Disp: 30 tablet, Rfl: 5 .  Ascorbic Acid (VITAMIN C) 1000 MG tablet, Take 1,000 mg by mouth daily. , Disp: , Rfl:  .  aspirin 81 MG tablet, Take 81 mg by mouth daily. , Disp: , Rfl:  .  Calcium Carbonate (CALCIUM 600 PO), Take 1 capsule by mouth daily. , Disp: , Rfl:  .  Cholecalciferol (VITAMIN D) 2000 UNITS CAPS, Take 1 capsule by mouth daily. , Disp: , Rfl:  .  furosemide (LASIX) 20 MG tablet, Take 1 tablet (20 mg total) by mouth daily., Disp: 30 tablet, Rfl: 5 .  losartan (COZAAR) 100 MG tablet, Take 1 tablet (100 mg total) by mouth daily., Disp: 30 tablet, Rfl: 5 .  meclizine (ANTIVERT) 25 MG tablet, take 1/2 to 1 tablet by mouth once daily, Disp: 30 tablet, Rfl: 0 .  Multiple Vitamin (MULTIVITAMIN WITH MINERALS) TABS, Take 1 tablet by mouth daily., Disp: , Rfl:  .  Omega-3 Fatty Acids (FISH OIL) 1000 MG CAPS, Take 1 capsule by mouth daily. , Disp: ,  Rfl:  .  Polyethyl Glycol-Propyl Glycol (SYSTANE ULTRA OP), Apply 1 drop to eye daily as needed (for dry eyes)., Disp: , Rfl:  .  polyethylene glycol (MIRALAX / GLYCOLAX) packet, Take 17 g by mouth daily., Disp: 10 each, Rfl: 0 .  pregabalin (LYRICA) 100 MG capsule, Take 1 capsule (100 mg total) by mouth at bedtime., Disp: 30 capsule, Rfl: 5 .  pregabalin (LYRICA) 25 MG capsule, Take 1 capsule (25 mg total) by mouth 2 (two) times daily., Disp: 60 capsule, Rfl: 5 .  risedronate (ACTONEL) 35 MG tablet, Take 1 tablet (35 mg total) by mouth every 7 (seven) days. with water on empty stomach, nothing by mouth or lie down for next 30 minutes., Disp: 12 tablet, Rfl: 1  Objective: BP 140/70 (BP Location: Left Arm, Patient Position: Sitting, Cuff Size: Normal)   Pulse 81   Temp 97.6 F (36.4 C) (Oral)   Ht 5\' 5"  (1.651 m)   Wt 183 lb 6.4 oz (83.2 kg)   SpO2 96%   BMI 30.52 kg/m   General: Awake, appears stated age Heart: RRR, no murmurs Lungs: CTAB, no rales, wheezes or rhonchi. No accessory muscle use MSK: Gait requiring 4 pt walker Psych: Age appropriate judgment and insight, normal affect and mood  Assessment and Plan: Age-related osteoporosis without current pathological fracture - Plan: risedronate (ACTONEL) 35 MG tablet  Orders as above. Stop Fosamax. If Acotnel still causes issues, will change to 18 mo of Forteo. If that is not an option, could try Reclast vs Prolia. BP OK for age. F/u in 4 weeks to see how she is doing on her new medication. She will let us know if things are not going well. The patient voiced understanding and agreement to the plan.  [place], DO 05/10/16  3:58 PM

## 2016-05-10 NOTE — Telephone Encounter (Signed)
Spoke to Vanita Ingles, Therapist, sports, [facility] Triage nurse and she IT trainer that the patient is having some heartburn & swelling in feet & ankles; she has been taking Fosamax for 1 month now. Per Vanita Ingles, RN the patient was advised to go to the ED or come in for an office visit, but declined due to unavailable transportation.  Called & follow-up with the patient regarding the above medical concerns. She reported that she's been taking Fosamax for 4 weeks now and noticed that she's been experiencing severe heartburn and would like to discuss whether or not she should continue this medication. Patient voiced that she's suppose to start the medication again today, but will be holding the dose until further recommendation has been provided. Per Mrs. Knauer, she can be seen in the office, but later in the evening once her husband is home from work. Appointment scheduled for 05/10/16 at 3:00 PM with Dr. Nani Ravens. Advised patient to call the office or 911 for EMS if her condition changes or symptoms worsen before the appointment time. She verbalized that her symptoms are calm right now, but will definitely give [place] a call back or seek ED if things become seemingly bad. No further questions or concerns prior to call ending. Message routed to Dr. Nani Ravens for review.

## 2016-05-10 NOTE — Telephone Encounter (Signed)
Patient called stating that since taking alendronate (FOSAMAX) 70 MG tablet she has had heartburn very bad, soreness and pressure in her esophagus and swelling in her feet and ankles. Patient sent to [facility] to be triaged by a nurse.

## 2016-05-10 NOTE — Telephone Encounter (Signed)
Patient has appt today with DR. Wendling

## 2016-05-27 DIAGNOSIS — S32020A Wedge compression fracture of second lumbar vertebra, initial encounter for closed fracture: Secondary | ICD-10-CM | POA: Diagnosis not present

## 2016-06-07 ENCOUNTER — Other Ambulatory Visit (HOSPITAL_BASED_OUTPATIENT_CLINIC_OR_DEPARTMENT_OTHER): Payer: Self-pay

## 2016-06-07 ENCOUNTER — Ambulatory Visit: Payer: Self-pay | Admitting: Family Medicine

## 2016-06-08 ENCOUNTER — Ambulatory Visit (INDEPENDENT_AMBULATORY_CARE_PROVIDER_SITE_OTHER): Payer: Medicare Other | Admitting: Family Medicine

## 2016-06-08 ENCOUNTER — Ambulatory Visit (HOSPITAL_BASED_OUTPATIENT_CLINIC_OR_DEPARTMENT_OTHER)
Admission: RE | Admit: 2016-06-08 | Discharge: 2016-06-08 | Disposition: A | Discharge status: Home / Self Care | Payer: Medicare Other | Source: Ambulatory Visit | Attending: Family Medicine | Admitting: Family Medicine

## 2016-06-08 ENCOUNTER — Encounter: Payer: Self-pay | Admitting: Family Medicine

## 2016-06-08 DIAGNOSIS — M810 Age-related osteoporosis without current pathological fracture: Secondary | ICD-10-CM | POA: Diagnosis not present

## 2016-06-08 MED ORDER — ZOSTER VAC RECOMB ADJUVANTED 50 MCG/0.5ML IM SUSR: 50.0000 ug | Freq: Once | INTRAMUSCULAR | 1 refills | Status: AC

## 2016-06-08 NOTE — Progress Notes (Signed)
I acted as a Education administrator for Dr. Royden Purl, LPN     Subjective:    Patient ID: Krystal Henderson, female    DOB: November 07, 1932, 83 y.o.   MRN: [id number]  Chief Complaint  Patient presents with  . Osteoporosis    HPI  Patient is in today to follow for osteoporosis. Patient report she started taking Fosamax and it burned her throat, she also report she started taking Actonel and it burned her throat as well, therefore she stopped taking both medication.  Patient Care Team: Ann Held, DO as PCP - General (Family Medicine) Edrick Oh, MD as Consulting Physician (Nephrology)   Past Medical History:  Diagnosis Date  . Anemia    hx of   . Angiodysplasia 2007   @ colonoscopy  . Anxiety    PMH of  . Chronic low back pain 06/21/2014  . Diverticulosis of colon (without mention of hemorrhage)   . DJD (degenerative joint disease)   . Esophageal reflux    inactive  . HTN (hypertension)   . Hyperlipemia 2006   LDL 130  . Hypoglycemia    reactive  . Pelvic fracture ([place]) 12/30/2011   [facility]  . Peripheral neuropathy ([place])   . Personal history of colonic polyps    adenomatous  . Vitamin B12 deficiency     Past Surgical History:  Procedure Laterality Date  . cataract surgery  12-26-12 and 01-09-13  . COLONOSCOPY  2011   neg  . COLONOSCOPY W/ POLYPECTOMY     X 2 , Dr  Verl Blalock; angiodysplasia. Due 2022  . DILATION AND CURETTAGE OF UTERUS    . FACIAL COSMETIC SURGERY    . HEMORROIDECTOMY    . LUMBAR LAMINECTOMY/DECOMPRESSION MICRODISCECTOMY  09/10/2011   Procedure: LUMBAR LAMINECTOMY/DECOMPRESSION MICRODISCECTOMY;  Surgeon: Johnn Hai, MD;  Location: [facility];  Service: Orthopedics;  Laterality: N/A;  decompression laminectomy L2-3, L3-4, L4-5  . ORIF WRIST FRACTURE  01/11/2012   Procedure: OPEN REDUCTION INTERNAL FIXATION (ORIF) WRIST FRACTURE;  Surgeon: Tennis Must, MD;  Location: [facility];  Service: Orthopedics;  Laterality: Right;  .  TEAR DUCT PROBING     X 2  . TOTAL HIP ARTHROPLASTY  2000   right  . TUBAL LIGATION      Family History  Problem Relation Age of Onset  . Heart attack Father 60  . Kidney disease Mother     renal failure  . Throat cancer Sister     smoker  . Osteoporosis Sister   . Heart attack Brother 74  . Stroke Brother 71    smoker  . Depression Maternal Uncle   . Cancer Brother     X3  lung cancer, all smokers  . Peripheral vascular disease Daughter   . Colon cancer Neg Hx   . Diabetes Neg Hx     Social History   Social History  . Marital status: Married    Spouse name: N/A  . Number of children: 1  . Years of education: hs   Occupational History  . retired   .  Retired   Social History Main Topics  . Smoking status: Never Smoker  . Smokeless tobacco: Never Used  . Alcohol use 1.8 oz/week    3 Glasses of wine per week     Comment: wine occasionally  . Drug use: No  . Sexual activity: Yes    Birth control/ protection: Diaphragm   Other Topics Concern  . Not  on file   Social History Narrative   Patient is right handed.   Patient does not drink caffeine.    Outpatient Medications Prior to Visit  Medication Sig Dispense Refill  . ALPRAZolam (XANAX) 0.25 MG tablet 1 every 8-12 hrs prn , not routinely (Patient taking differently: 0.25-0.5 mg 2 (two) times daily as needed for anxiety or sleep. ) 30 tablet 0  . amLODipine (NORVASC) 5 MG tablet Take 1 tablet (5 mg total) by mouth daily. 30 tablet 5  . Ascorbic Acid (VITAMIN C) 1000 MG tablet Take 1,000 mg by mouth daily.     [name] aspirin 81 MG tablet Take 81 mg by mouth daily.     . Calcium Carbonate (CALCIUM 600 PO) Take 1 capsule by mouth daily.     . Cholecalciferol (VITAMIN D) 2000 UNITS CAPS Take 1 capsule by mouth daily.     . furosemide (LASIX) 20 MG tablet Take 1 tablet (20 mg total) by mouth daily. 30 tablet 5  . losartan (COZAAR) 100 MG tablet Take 1 tablet (100 mg total) by mouth daily. 30 tablet 5  . meclizine  (ANTIVERT) 25 MG tablet take 1/2 to 1 tablet by mouth once daily 30 tablet 0  . Multiple Vitamin (MULTIVITAMIN WITH MINERALS) TABS Take 1 tablet by mouth daily.    . Omega-3 Fatty Acids (FISH OIL) 1000 MG CAPS Take 1 capsule by mouth daily.     [name] Glycol-Propyl Glycol (SYSTANE ULTRA OP) Apply 1 drop to eye daily as needed (for dry eyes).    . polyethylene glycol (MIRALAX / GLYCOLAX) packet Take 17 g by mouth daily. 10 each 0  . pregabalin (LYRICA) 100 MG capsule Take 1 capsule (100 mg total) by mouth at bedtime. 30 capsule 5  . pregabalin (LYRICA) 25 MG capsule Take 1 capsule (25 mg total) by mouth 2 (two) times daily. 60 capsule 5  . risedronate (ACTONEL) 35 MG tablet Take 1 tablet (35 mg total) by mouth every 7 (seven) days. with water on empty stomach, nothing by mouth or lie down for next 30 minutes. (Patient not taking: Reported on 06/08/2016) 12 tablet 1   No facility-administered medications prior to visit.     Allergies  Allergen Reactions  . Codeine Nausea Only  . Duloxetine Other (See Comments)    REACTION: intolerance--She does not remember taking this Rx- states she did not feel well   . Sertraline Hcl Other (See Comments)    REACTION: intolerance-- Did not help with Depression    Review of Systems  Constitutional: Negative for fever.  HENT: Negative for congestion.   Eyes: Negative for blurred vision.  Respiratory: Negative for cough.   Cardiovascular: Negative for chest pain and palpitations.  Gastrointestinal: Negative for vomiting.  Musculoskeletal: Negative for back pain.  Skin: Negative for rash.  Neurological: Negative for loss of consciousness and headaches.       Objective:    Physical Exam  Constitutional: She is oriented to person, place, and time. She appears well-developed and well-nourished. No distress.  HENT:  Head: Normocephalic and atraumatic.  Eyes: Conjunctivae are normal. Pupils are equal, round, and reactive to light.  Neck: Normal  range of motion. No thyromegaly present.  Cardiovascular: Normal rate and regular rhythm.   Pulmonary/Chest: Effort normal and breath sounds normal. She has no wheezes.  Abdominal: Soft. Bowel sounds are normal. There is no tenderness.  Musculoskeletal: Normal range of motion. She exhibits no edema or deformity.  Neurological: She is alert and  oriented to person, place, and time.  Skin: Skin is warm and dry. She is not diaphoretic.  Psychiatric: She has a normal mood and affect.    BP 128/62 (BP Location: Right Arm, Patient Position: Sitting, Cuff Size: Normal)   Pulse 76   Temp 97.5 F (36.4 C) (Oral)   Resp 16   Ht 5\' 5"  (1.651 m)   Wt 182 lb (82.6 kg)   SpO2 98%   BMI 30.29 kg/m  Wt Readings from Last 3 Encounters:  06/08/16 182 lb (82.6 kg)  05/10/16 183 lb 6.4 oz (83.2 kg)  02/20/16 180 lb 6.4 oz (81.8 kg)     Lab Results  Component Value Date   WBC 7.9 02/20/2016   HGB 12.3 02/20/2016   HCT 37.3 02/20/2016   PLT 373.0 02/20/2016   GLUCOSE 94 02/20/2016   CHOL 232 (H) 02/20/2016   TRIG 130.0 02/20/2016   HDL 68.50 02/20/2016   LDLDIRECT 129.0 10/18/2014   LDLCALC 137 (H) 02/20/2016   ALT 15 02/20/2016   AST 16 02/20/2016   NA 141 02/20/2016   K 4.4 02/20/2016   CL 104 02/20/2016   CREATININE 1.62 (H) 02/20/2016   BUN 21 02/20/2016   CO2 27 02/20/2016   TSH 3.53 11/26/2013   INR 1.3 ratio (H) 10/29/2009   HGBA1C 5.6 08/24/2011    Lab Results  Component Value Date   TSH 3.53 11/26/2013   Lab Results  Component Value Date   WBC 7.9 02/20/2016   HGB 12.3 02/20/2016   HCT 37.3 02/20/2016   MCV 93.8 02/20/2016   PLT 373.0 02/20/2016   Lab Results  Component Value Date   NA 141 02/20/2016   K 4.4 02/20/2016   CO2 27 02/20/2016   GLUCOSE 94 02/20/2016   BUN 21 02/20/2016   CREATININE 1.62 (H) 02/20/2016   BILITOT 0.4 02/20/2016   ALKPHOS 59 02/20/2016   AST 16 02/20/2016   ALT 15 02/20/2016   PROT 7.5 02/20/2016   ALBUMIN 4.3 02/20/2016    CALCIUM 10.1 02/20/2016   ANIONGAP 8 11/04/2015   GFR 32.36 (L) 02/20/2016   Lab Results  Component Value Date   CHOL 232 (H) 02/20/2016   Lab Results  Component Value Date   HDL 68.50 02/20/2016   Lab Results  Component Value Date   LDLCALC 137 (H) 02/20/2016   Lab Results  Component Value Date   TRIG 130.0 02/20/2016   Lab Results  Component Value Date   CHOLHDL 3 02/20/2016   Lab Results  Component Value Date   HGBA1C 5.6 08/24/2011       Assessment & Plan:   Problem List Items Addressed This Visit      Unprioritized   Osteoporosis    Pt interested in prolia -- will check with ins and let pt know outcome Pt unable to tolerate fosamax and actonel Recheck bmd 2 years         I have discontinued Krystal Henderson's risedronate. I am also having her start on Zoster Vac Recomb Adjuvanted. Additionally, I am having her maintain her aspirin, Calcium Carbonate (CALCIUM 600 PO), Fish Oil, vitamin C, Vitamin D, multivitamin with minerals, polyethylene glycol, meclizine, ALPRAZolam, Polyethyl Glycol-Propyl Glycol (SYSTANE ULTRA OP), pregabalin, pregabalin, amLODipine, furosemide, and losartan.  Meds ordered this encounter  Medications  . Zoster Vac Recomb Adjuvanted (SHINGRIX) 50 MCG SUSR    Sig: Inject 50 mcg into the muscle once.    Dispense:  1 each    Refill:  1  CMA served as Education administrator during this visit. History, Physical and Plan performed by medical provider. Documentation and orders reviewed and attested to.  Ann Held, DO  Patient ID: Krystal Henderson, female   DOB: 12-Dec-1932, 83 y.o.   MRN: [id number]

## 2016-06-08 NOTE — Patient Instructions (Signed)

## 2016-06-08 NOTE — Progress Notes (Signed)
Pre visit review using our clinic review tool, if applicable. No additional management support is needed unless otherwise documented below in the visit note. 

## 2016-06-09 NOTE — Assessment & Plan Note (Signed)
Pt interested in prolia -- will check with ins and let pt know outcome Pt unable to tolerate fosamax and actonel Recheck bmd 2 years

## 2016-06-17 DIAGNOSIS — S32020S Wedge compression fracture of second lumbar vertebra, sequela: Secondary | ICD-10-CM | POA: Diagnosis not present

## 2016-07-05 ENCOUNTER — Other Ambulatory Visit: Payer: Self-pay | Admitting: Neurology

## 2016-07-05 DIAGNOSIS — G609 Hereditary and idiopathic neuropathy, unspecified: Secondary | ICD-10-CM

## 2016-07-06 NOTE — Telephone Encounter (Signed)
Faxed printed/signed rx Lyrica for 100mg  at bedtime and 25 mg cap 2x/day. Fax: [phone]. Received confirmation.

## 2016-07-20 ENCOUNTER — Ambulatory Visit: Payer: Self-pay | Admitting: Neurology

## 2016-07-20 ENCOUNTER — Telehealth: Payer: Self-pay | Admitting: *Deleted

## 2016-07-20 NOTE — Telephone Encounter (Signed)
error 

## 2016-07-20 NOTE — Telephone Encounter (Signed)
Called pt back to see if she would like to keep her appt for this afternoon since the power is back on. However, she would still like a return call to r/s appt for another day. She's already called her husband and told him not to get off of work early today.

## 2016-07-20 NOTE — Telephone Encounter (Signed)
Called and spoke w/ pt. Advised office closed today. No power d/t weather. Appt cx today. Will call back to r/s her appt once office back open. She verbalized understanding.

## 2016-07-20 NOTE — Telephone Encounter (Signed)
Called and spoke with pt. Scheduled appt with MM,NP 07/22/16 at 230pm, check in 215. Pt verbalized understanding. CW,MD ok for pt to see NP.

## 2016-07-22 ENCOUNTER — Encounter: Payer: Self-pay | Admitting: Adult Health

## 2016-07-22 ENCOUNTER — Ambulatory Visit (INDEPENDENT_AMBULATORY_CARE_PROVIDER_SITE_OTHER): Payer: Medicare Other | Admitting: Adult Health

## 2016-07-22 VITALS — BP 150/71 | HR 69 | Wt 184.8 lb

## 2016-07-22 DIAGNOSIS — G609 Hereditary and idiopathic neuropathy, unspecified: Secondary | ICD-10-CM | POA: Diagnosis not present

## 2016-07-22 DIAGNOSIS — G8929 Other chronic pain: Secondary | ICD-10-CM

## 2016-07-22 DIAGNOSIS — M545 Low back pain: Secondary | ICD-10-CM | POA: Diagnosis not present

## 2016-07-22 NOTE — Patient Instructions (Signed)
Continue Lyrica Use Rollator at all times If your symptoms worsen or you develop new symptoms please let us know.

## 2016-07-22 NOTE — Progress Notes (Signed)
PATIENT: Krystal Henderson DOB: 07-04-1932  REASON FOR VISIT: follow up- chronic low back pain, peripheral neuropathy HISTORY FROM: patient  HISTORY OF PRESENT ILLNESS: Krystal Henderson is an 83 year old female with a history of chronic low back pain and peripheral neuropathy. Krystal Henderson returns today for follow-up. Krystal Henderson reports that Lyrica is working well for her neuropathy. Krystal Henderson states that if Krystal Henderson skips a dose Krystal Henderson has increased burning in the feet. Krystal Henderson denies any changes with her gait or balance. Krystal Henderson does state that Krystal Henderson now uses a Rollator due to back pain. Krystal Henderson has not had any falls. Krystal Henderson has seen a surgeon in regards to her compression fracture but due to her age they have refrained from surgery. Krystal Henderson is seeing pain management on Monday. Krystal Henderson returns today for an evaluation.  HISTORY 01/13/16: Krystal Henderson is an 83 year old right-handed white female with a history of chronic low back pain and a peripheral neuropathy. The patient had a spontaneous L1 compression fracture around 11/04/2015. The patient has been wearing a back support brace, but Krystal Henderson still has ongoing discomfort. The pain stays in the back and does not radiate down the legs. The patient has not had any falls, Krystal Henderson uses a walker for ambulation. Krystal Henderson has had a lot of problems with peripheral edema of both legs, Krystal Henderson is on amlodipine and Lyrica. The swelling of the legs does not go down overnight. Krystal Henderson has Tylenol for pain, Krystal Henderson no longer has hydrocodone.  REVIEW OF SYSTEMS: Out of a complete 14 system review of symptoms, the patient complains only of the following symptoms, and all other reviewed systems are negative.  Joint pain, back pain, walking difficulty  ALLERGIES: Allergies  Allergen Reactions  . Codeine Nausea Only  . Duloxetine Other (See Comments)    REACTION: intolerance--Krystal Henderson does not remember taking this Rx- states Krystal Henderson did not feel well   . Sertraline Hcl Other (See Comments)    REACTION: intolerance-- Did not help with Depression     HOME MEDICATIONS: Outpatient Medications Prior to Visit  Medication Sig Dispense Refill  . ALPRAZolam (XANAX) 0.25 MG tablet 1 every 8-12 hrs prn , not routinely (Patient taking differently: 0.25-0.5 mg 2 (two) times daily as needed for anxiety or sleep. ) 30 tablet 0  . amLODipine (NORVASC) 5 MG tablet Take 1 tablet (5 mg total) by mouth daily. 30 tablet 5  . Ascorbic Acid (VITAMIN C) 1000 MG tablet Take 1,000 mg by mouth daily.     [name] aspirin 81 MG tablet Take 81 mg by mouth daily.     . Calcium Carbonate (CALCIUM 600 PO) Take 1 capsule by mouth daily.     . Cholecalciferol (VITAMIN D) 2000 UNITS CAPS Take 1 capsule by mouth daily.     . furosemide (LASIX) 20 MG tablet Take 1 tablet (20 mg total) by mouth daily. 30 tablet 5  . losartan (COZAAR) 100 MG tablet Take 1 tablet (100 mg total) by mouth daily. 30 tablet 5  . LYRICA 100 MG capsule take 1 capsule by mouth at bedtime (MUST LAST 30 DAYS) 30 capsule 5  . LYRICA 25 MG capsule take 1 capsule by mouth twice a day (MUST LAST 30 DAYS) 60 capsule 5  . meclizine (ANTIVERT) 25 MG tablet take 1/2 to 1 tablet by mouth once daily 30 tablet 0  . Multiple Vitamin (MULTIVITAMIN WITH MINERALS) TABS Take 1 tablet by mouth daily.    . Omega-3 Fatty Acids (FISH OIL) 1000 MG CAPS Take 1 capsule by  mouth daily.     [name] Glycol-Propyl Glycol (SYSTANE ULTRA OP) Apply 1 drop to eye daily as needed (for dry eyes).    . polyethylene glycol (MIRALAX / GLYCOLAX) packet Take 17 g by mouth daily. 10 each 0   No facility-administered medications prior to visit.     PAST MEDICAL HISTORY: Past Medical History:  Diagnosis Date  . Anemia    hx of   . Angiodysplasia 2007   @ colonoscopy  . Anxiety    PMH of  . Chronic low back pain 06/21/2014  . Diverticulosis of colon (without mention of hemorrhage)   . DJD (degenerative joint disease)   . Esophageal reflux    inactive  . HTN (hypertension)   . Hyperlipemia 2006   LDL 130  . Hypoglycemia     reactive  . Pelvic fracture ([place]) 12/30/2011   [facility]  . Peripheral neuropathy   . Personal history of colonic polyps    adenomatous  . Vitamin B12 deficiency     PAST SURGICAL HISTORY: Past Surgical History:  Procedure Laterality Date  . cataract surgery  12-26-12 and 01-09-13  . COLONOSCOPY  2011   neg  . COLONOSCOPY W/ POLYPECTOMY     X 2 , Dr  Verl Blalock; angiodysplasia. Due 2022  . DILATION AND CURETTAGE OF UTERUS    . FACIAL COSMETIC SURGERY    . HEMORROIDECTOMY    . LUMBAR LAMINECTOMY/DECOMPRESSION MICRODISCECTOMY  09/10/2011   Procedure: LUMBAR LAMINECTOMY/DECOMPRESSION MICRODISCECTOMY;  Surgeon: Johnn Hai, MD;  Location: [facility];  Service: Orthopedics;  Laterality: N/A;  decompression laminectomy L2-3, L3-4, L4-5  . ORIF WRIST FRACTURE  01/11/2012   Procedure: OPEN REDUCTION INTERNAL FIXATION (ORIF) WRIST FRACTURE;  Surgeon: Tennis Must, MD;  Location: [place];  Service: Orthopedics;  Laterality: Right;  . TEAR DUCT PROBING     X 2  . TOTAL HIP ARTHROPLASTY  2000   right  . TUBAL LIGATION      FAMILY HISTORY: Family History  Problem Relation Age of Onset  . Heart attack Father 24  . Kidney disease Mother     renal failure  . Throat cancer Sister     smoker  . Osteoporosis Sister   . Heart attack Brother 61  . Stroke Brother 66    smoker  . Depression Maternal Uncle   . Cancer Brother     X3  lung cancer, all smokers  . Peripheral vascular disease Daughter   . Colon cancer Neg Hx   . Diabetes Neg Hx     SOCIAL HISTORY: Social History   Social History  . Marital status: Married    Spouse name: N/A  . Number of children: 1  . Years of education: hs   Occupational History  . retired   .  Retired   Social History Main Topics  . Smoking status: Never Smoker  . Smokeless tobacco: Never Used  . Alcohol use 1.8 oz/week    3 Glasses of wine per week     Comment: wine occasionally  . Drug use: No  . Sexual activity:  Yes    Birth control/ protection: Diaphragm   Other Topics Concern  . Not on file   Social History Narrative   Patient is right handed.   Patient does not drink caffeine.      PHYSICAL EXAM  Vitals:   07/22/16 1446  BP: (!) 150/71  Pulse: 69  Weight: 184 lb 12.8 oz (83.8 kg)  Body mass index is 30.75 kg/m.  Generalized: Well developed, in no acute distress   Neurological examination  Mentation: Alert oriented to time, place, history taking. Follows all commands speech and language fluent Cranial nerve II-XII: Pupils were equal round reactive to light. Extraocular movements were full, visual field were full on confrontational test. Facial sensation and strength were normal. Uvula tongue midline. Head turning and shoulder shrug  were normal and symmetric. Motor: The motor testing reveals 5 over 5 strength of all 4 extremities. Good symmetric motor tone is noted throughout.  Sensory: Sensory testing is intact to soft touch on all 4 extremities. No evidence of extinction is noted.  Coordination: Cerebellar testing reveals good finger-nose-finger and heel-to-shin bilaterally.  Gait and station: Tightly stooped posture. Gait is unsteady. Krystal Henderson uses a Rollator when ambulating. Tandem gait not attempted. Reflexes: Deep tendon reflexes are symmetric and normal bilaterally.   DIAGNOSTIC DATA (LABS, IMAGING, TESTING) - I reviewed patient records, labs, notes, testing and imaging myself where available.  Lab Results  Component Value Date   WBC 7.9 02/20/2016   HGB 12.3 02/20/2016   HCT 37.3 02/20/2016   MCV 93.8 02/20/2016   PLT 373.0 02/20/2016      Component Value Date/Time   NA 141 02/20/2016 1355   K 4.4 02/20/2016 1355   CL 104 02/20/2016 1355   CO2 27 02/20/2016 1355   GLUCOSE 94 02/20/2016 1355   BUN 21 02/20/2016 1355   CREATININE 1.62 (H) 02/20/2016 1355   CALCIUM 10.1 02/20/2016 1355   PROT 7.5 02/20/2016 1355   ALBUMIN 4.3 02/20/2016 1355   AST 16 02/20/2016  1355   ALT 15 02/20/2016 1355   ALKPHOS 59 02/20/2016 1355   BILITOT 0.4 02/20/2016 1355   GFRNONAA 28 (L) 11/04/2015 0932   GFRAA 32 (L) 11/04/2015 0932   Lab Results  Component Value Date   CHOL 232 (H) 02/20/2016   HDL 68.50 02/20/2016   LDLCALC 137 (H) 02/20/2016   LDLDIRECT 129.0 10/18/2014   TRIG 130.0 02/20/2016   CHOLHDL 3 02/20/2016   Lab Results  Component Value Date   HGBA1C 5.6 08/24/2011   Lab Results  Component Value Date   VITAMINB12 367 04/21/2015   Lab Results  Component Value Date   TSH 3.53 11/26/2013      ASSESSMENT AND PLAN 83 y.o. year old female  has a past medical history of Anemia; Angiodysplasia (2007); Anxiety; Chronic low back pain (06/21/2014); Diverticulosis of colon (without mention of hemorrhage); DJD (degenerative joint disease); Esophageal reflux; HTN (hypertension); Hyperlipemia (2006); Hypoglycemia; Pelvic fracture ([place]) (12/30/2011); Peripheral neuropathy; Personal history of colonic polyps; and Vitamin B12 deficiency. here with:  1. Peripheral neuropathy 2. Low back pain  Overall the patient has remained stable. Krystal Henderson will continue on Lyrica. I have advised that if her symptoms worsen or Krystal Henderson develops new symptoms Krystal Henderson should let us know. Krystal Henderson will follow-up in 6 months or sooner if needed.    Ward Givens, MSN, NP-C 07/22/2016, 2:47 PM [facility] [address] [place], [place] [address] [phone]

## 2016-07-26 DIAGNOSIS — M5136 Other intervertebral disc degeneration, lumbar region: Secondary | ICD-10-CM | POA: Diagnosis not present

## 2016-07-26 DIAGNOSIS — S32020S Wedge compression fracture of second lumbar vertebra, sequela: Secondary | ICD-10-CM | POA: Diagnosis not present

## 2016-07-26 DIAGNOSIS — M961 Postlaminectomy syndrome, not elsewhere classified: Secondary | ICD-10-CM | POA: Diagnosis not present

## 2016-08-04 DIAGNOSIS — H31012 Macula scars of posterior pole (postinflammatory) (post-traumatic), left eye: Secondary | ICD-10-CM | POA: Diagnosis not present

## 2016-08-04 DIAGNOSIS — H26491 Other secondary cataract, right eye: Secondary | ICD-10-CM | POA: Diagnosis not present

## 2016-08-04 DIAGNOSIS — Z961 Presence of intraocular lens: Secondary | ICD-10-CM | POA: Diagnosis not present

## 2016-08-04 DIAGNOSIS — H5203 Hypermetropia, bilateral: Secondary | ICD-10-CM | POA: Diagnosis not present

## 2016-08-05 ENCOUNTER — Telehealth: Payer: Self-pay | Admitting: Family Medicine

## 2016-08-05 NOTE — Telephone Encounter (Signed)
Called the patient informed of results/patient has decided not to get.

## 2016-08-05 NOTE — Telephone Encounter (Signed)
Received verification of benefits for prolia  No PA required Admin and prolia are subject to 20% co-insurance   Patient may owe approximately $250 out of pocket

## 2016-08-11 ENCOUNTER — Other Ambulatory Visit: Payer: Self-pay | Admitting: Family Medicine

## 2016-08-11 DIAGNOSIS — R600 Localized edema: Secondary | ICD-10-CM

## 2016-08-11 DIAGNOSIS — I10 Essential (primary) hypertension: Secondary | ICD-10-CM

## 2016-08-18 DIAGNOSIS — M5136 Other intervertebral disc degeneration, lumbar region: Secondary | ICD-10-CM | POA: Diagnosis not present

## 2016-08-18 DIAGNOSIS — S32020S Wedge compression fracture of second lumbar vertebra, sequela: Secondary | ICD-10-CM | POA: Diagnosis not present

## 2016-08-27 ENCOUNTER — Encounter: Payer: Self-pay | Admitting: Family Medicine

## 2016-09-01 ENCOUNTER — Telehealth: Payer: Self-pay | Admitting: *Deleted

## 2016-09-01 NOTE — Telephone Encounter (Signed)
AWV scheduled 09/03/16 @1  with Glenard Haring

## 2016-09-02 NOTE — Progress Notes (Signed)
Subjective:   Krystal Henderson is a 83 y.o. female who presents for Medicare Annual (Subsequent) preventive examination. Pt accompanied by husband in waiting area. Pt presents using rolling walker/with seat. Pleasant.   Review of Systems:  No ROS.  Medicare Wellness Visit. Cardiac Risk Factors include: hypertension Sleep patterns: Falls asleep on sofa. Sleeps about 1030p- 8am.Feels rested. Home Safety/Smoke Alarms:  Feels safe in home. Smoke alarms in place.  Living environment; residence and Adult nurse: lives w/ husband. One story with basement. Guns safely stored. Has shower seat and safety rail. Seat Belt Safety/Bike Helmet: Wears seat belt.   Counseling:   Eye Exam- Wears glasses. Dr.Stoneberhger yearly. Dental- Dr.Ed Edsel Petrin yearly.  Female:   Pap-   No longer screening per pt.    Mammo-  Last 03/14/15: BI-RADS CATEGORY  1: Negative.    Pt declines today. Dexa scan- Last 06/08/16:  osteoporosis      CCS- Last 10/10/09. No routine screening due to age     Objective:     Vitals: BP 134/70 (BP Location: Right Arm, Patient Position: Sitting, Cuff Size: Normal)   Pulse 80   Ht 5\' 5"  (1.651 m)   Wt 180 lb 9.6 oz (81.9 kg)   SpO2 97%   BMI 30.05 kg/m   Body mass index is 30.05 kg/m.   Tobacco History  Smoking Status  . Never Smoker  Smokeless Tobacco  . Never Used     Counseling given: Not Answered   Past Medical History:  Diagnosis Date  . Anemia    hx of   . Angiodysplasia 2007   @ colonoscopy  . Anxiety    PMH of  . Chronic low back pain 06/21/2014  . Diverticulosis of colon (without mention of hemorrhage)   . DJD (degenerative joint disease)   . Esophageal reflux    inactive  . HTN (hypertension)   . Hyperlipemia 2006   LDL 130  . Hypoglycemia    reactive  . Pelvic fracture ([place]) 12/30/2011   [facility]  . Peripheral neuropathy   . Personal history of colonic polyps    adenomatous  . Vitamin B12 deficiency    Past Surgical History:    Procedure Laterality Date  . cataract surgery  12-26-12 and 01-09-13  . COLONOSCOPY  2011   neg  . COLONOSCOPY W/ POLYPECTOMY     X 2 , Dr  Verl Blalock; angiodysplasia. Due 2022  . DILATION AND CURETTAGE OF UTERUS    . FACIAL COSMETIC SURGERY    . HEMORROIDECTOMY    . LUMBAR LAMINECTOMY/DECOMPRESSION MICRODISCECTOMY  09/10/2011   Procedure: LUMBAR LAMINECTOMY/DECOMPRESSION MICRODISCECTOMY;  Surgeon: Johnn Hai, MD;  Location: [facility];  Service: Orthopedics;  Laterality: N/A;  decompression laminectomy L2-3, L3-4, L4-5  . ORIF WRIST FRACTURE  01/11/2012   Procedure: OPEN REDUCTION INTERNAL FIXATION (ORIF) WRIST FRACTURE;  Surgeon: Tennis Must, MD;  Location: [facility];  Service: Orthopedics;  Laterality: Right;  . TEAR DUCT PROBING     X 2  . TOTAL HIP ARTHROPLASTY  2000   right  . TUBAL LIGATION     Family History  Problem Relation Age of Onset  . Heart attack Father 36  . Kidney disease Mother        renal failure  . Throat cancer Sister        smoker  . Osteoporosis Sister   . Heart attack Brother 91  . Stroke Brother 64  smoker  . Depression Maternal Uncle   . Cancer Brother        X3  lung cancer, all smokers  . Peripheral vascular disease Daughter   . Colon cancer Neg Hx   . Diabetes Neg Hx    History  Sexual Activity  . Sexual activity: Yes  . Birth control/ protection: Diaphragm    Outpatient Encounter Prescriptions as of 09/03/2016  Medication Sig  . ALPRAZolam (XANAX) 0.25 MG tablet 1 every 8-12 hrs prn , not routinely (Patient taking differently: 0.25-0.5 mg 2 (two) times daily as needed for anxiety or sleep. )  . amLODipine (NORVASC) 5 MG tablet take 1 tablet by mouth once daily  . Ascorbic Acid (VITAMIN C) 1000 MG tablet Take 1,000 mg by mouth daily.   [name] aspirin 81 MG tablet Take 81 mg by mouth daily.   . Calcium Carbonate (CALCIUM 600 PO) Take 1 capsule by mouth daily.   . Cholecalciferol (VITAMIN D) 2000 UNITS CAPS Take 1  capsule by mouth daily.   . furosemide (LASIX) 20 MG tablet take 1 tablet by mouth once daily  . HYDROcodone-acetaminophen (NORCO/VICODIN) 5-325 MG tablet take 1 tablet by mouth twice a day if needed  . losartan (COZAAR) 100 MG tablet take 1 tablet by mouth once daily  . LYRICA 25 MG capsule take 1 capsule by mouth twice a day (MUST LAST 30 DAYS)  . meclizine (ANTIVERT) 25 MG tablet take 1/2 to 1 tablet by mouth once daily  . Multiple Vitamin (MULTIVITAMIN WITH MINERALS) TABS Take 1 tablet by mouth daily.  . Omega-3 Fatty Acids (FISH OIL) 1000 MG CAPS Take 1 capsule by mouth daily.   [name] Glycol-Propyl Glycol (SYSTANE ULTRA OP) Apply 1 drop to eye daily as needed (for dry eyes).  . polyethylene glycol (MIRALAX / GLYCOLAX) packet Take 17 g by mouth daily.  . [DISCONTINUED] LYRICA 100 MG capsule take 1 capsule by mouth at bedtime (MUST LAST 30 DAYS)  . alendronate (FOSAMAX) 70 MG tablet   . risedronate (ACTONEL) 35 MG tablet take 1 tablet by mouth every week WITH WATER, ON AN EMPTY STOMACH...  (REFER TO PRESCRIPTION NOTES).   No facility-administered encounter medications on file as of 09/03/2016.     Activities of Daily Living In your present state of health, do you have any difficulty performing the following activities: 09/03/2016 11/11/2015  Hearing? N N  Vision? N Y  Difficulty concentrating or making decisions? N N  Walking or climbing stairs? Y Y  Dressing or bathing? N N  Doing errands, shopping? Y N  Preparing Food and eating ? N -  Using the Toilet? N -  In the past six months, have you accidently leaked urine? N -  Do you have problems with loss of bowel control? N -  Managing your Medications? N -  Managing your Finances? Y -  Housekeeping or managing your Housekeeping? Y -  Some recent data might be hidden    Patient Care Team: Carollee Herter, Alferd Apa, DO as PCP - General (Family Medicine) Edrick Oh, MD as Consulting Physician (Nephrology) Kathrynn Ducking, MD as  Consulting Physician (Neurology)    Assessment:     Physical assessment deferred to PCP.  Exercise Activities and Dietary recommendations Current Exercise Habits: The patient does not participate in regular exercise at present, Exercise limited by: orthopedic condition(s)   Diet (meal preparation, eat out, water intake, caffeinated beverages, dairy products, fruits and vegetables): in general, a "healthy" diet  Breakfast: Cereal and fruit Lunch: PB and crackers Dinner:  Varies. Drinks plenty of water during day per pt.  Goals      Patient Stated   . Have decreased back pain  (pt-stated)      Fall Risk Fall Risk  09/03/2016 07/22/2016 11/11/2015 04/21/2015 04/19/2014  Falls in the past year? No No No No No  Number falls in past yr: - - - - -  Injury with Fall? - - - - -  Risk Factor Category  - - - - -   Depression Screen PHQ 2/9 Scores 09/03/2016 02/20/2016 11/11/2015 04/21/2015  PHQ - 2 Score 0 0 3 0  PHQ- 9 Score - - 9 -     Cognitive Function MMSE - Mini Mental State Exam 09/03/2016  Orientation to time 5  Orientation to Place 5  Registration 3  Attention/ Calculation 4  Recall 3  Language- name 2 objects 2  Language- repeat 0  Language- follow 3 step command 3  Language- read & follow direction 1  Write a sentence 1  Copy design 0  Total score 27        Immunization History  Administered Date(s) Administered  . Influenza Split 01/01/2012  . Influenza Whole 01/03/2008, 12/30/2009  . Influenza, High Dose Seasonal PF 01/04/2013, 01/08/2014  . Influenza-Unspecified 12/19/2014, 12/25/2015  . Pneumococcal Conjugate-13 02/11/2014  . Pneumococcal Polysaccharide-23 12/30/2009, 01/01/2012  . Tdap 03/15/2011  . Zoster 04/30/2013   Screening Tests Health Maintenance  Topic Date Due  . INFLUENZA VACCINE  11/03/2016  . TETANUS/TDAP  03/14/2021  . DEXA SCAN  Completed  . PNA vac Low Risk Adult  Completed      Plan:    Follow up with PCP today as scheduled.  Continue  to eat heart healthy diet (full of fruits, vegetables, whole grains, lean protein, water--limit salt, fat, and sugar intake) and increase physical activity as tolerated.  Continue doing brain stimulating activities (puzzles, reading, adult coloring books, staying active) to keep memory sharp.   Bring a copy of your advance directives to your next office visit.   I have personally reviewed and noted the following in the patient's chart:   . Medical and social history . Use of alcohol, tobacco or illicit drugs  . Current medications and supplements . Functional ability and status . Nutritional status . Physical activity . Advanced directives . List of other physicians . Hospitalizations, surgeries, and ER visits in previous 12 months . Vitals . Screenings to include cognitive, depression, and falls . Referrals and appointments  In addition, I have reviewed and discussed with patient certain preventive protocols, quality metrics, and best practice recommendations. A written personalized care plan for preventive services as well as general preventive health recommendations were provided to patient.     Shela Nevin, [place]  09/03/2016

## 2016-09-03 ENCOUNTER — Ambulatory Visit (INDEPENDENT_AMBULATORY_CARE_PROVIDER_SITE_OTHER): Payer: Medicare Other | Admitting: Family Medicine

## 2016-09-03 ENCOUNTER — Encounter: Payer: Self-pay | Admitting: Family Medicine

## 2016-09-03 VITALS — BP 134/70 | HR 80 | Ht 65.0 in | Wt 180.6 lb

## 2016-09-03 DIAGNOSIS — N183 Chronic kidney disease, stage 3 unspecified: Secondary | ICD-10-CM

## 2016-09-03 DIAGNOSIS — Z0000 Encounter for general adult medical examination without abnormal findings: Secondary | ICD-10-CM | POA: Diagnosis not present

## 2016-09-03 DIAGNOSIS — I10 Essential (primary) hypertension: Secondary | ICD-10-CM

## 2016-09-03 DIAGNOSIS — E782 Mixed hyperlipidemia: Secondary | ICD-10-CM

## 2016-09-03 LAB — LIPID PANEL
CHOL/HDL RATIO: 3
CHOLESTEROL: 213 mg/dL — AB (ref 0–200)
HDL: 78.8 mg/dL (ref >39.00)
LDL CALC: 121 mg/dL — AB (ref 0–99)
NONHDL: 134.34
Triglycerides: 68 mg/dL (ref 0.0–149.0)
VLDL: 13.6 mg/dL (ref 0.0–40.0)

## 2016-09-03 LAB — CBC WITH DIFFERENTIAL/PLATELET
BASOS PCT: 0.6 % (ref 0.0–3.0)
Basophils Absolute: 0 10*3/uL (ref 0.0–0.1)
EOS PCT: 1.3 % (ref 0.0–5.0)
Eosinophils Absolute: 0.1 10*3/uL (ref 0.0–0.7)
HCT: 39.7 % (ref 36.0–46.0)
HEMOGLOBIN: 12.9 g/dL (ref 12.0–15.0)
Lymphocytes Relative: 31.5 % (ref 12.0–46.0)
Lymphs Abs: 2.4 10*3/uL (ref 0.7–4.0)
MCHC: 32.6 g/dL (ref 30.0–36.0)
MCV: 95.5 fl (ref 78.0–100.0)
MONO ABS: 0.6 10*3/uL (ref 0.1–1.0)
Monocytes Relative: 8.1 % (ref 3.0–12.0)
Neutro Abs: 4.5 10*3/uL (ref 1.4–7.7)
Neutrophils Relative %: 58.5 % (ref 43.0–77.0)
Platelets: 339 10*3/uL (ref 150.0–400.0)
RBC: 4.15 Mil/uL (ref 3.87–5.11)
RDW: 15.2 % (ref 11.5–15.5)
WBC: 7.7 10*3/uL (ref 4.0–10.5)

## 2016-09-03 LAB — COMPREHENSIVE METABOLIC PANEL
ALT: 11 U/L (ref 0–35)
AST: 12 U/L (ref 0–37)
Albumin: 4.2 g/dL (ref 3.5–5.2)
Alkaline Phosphatase: 56 U/L (ref 39–117)
BUN: 25 mg/dL — AB (ref 6–23)
CHLORIDE: 102 meq/L (ref 96–112)
CO2: 27 meq/L (ref 19–32)
CREATININE: 1.65 mg/dL — AB (ref 0.40–1.20)
Calcium: 10.1 mg/dL (ref 8.4–10.5)
GFR: 31.64 mL/min — ABNORMAL LOW (ref >60.00)
GLUCOSE: 79 mg/dL (ref 70–99)
POTASSIUM: 4.4 meq/L (ref 3.5–5.1)
SODIUM: 138 meq/L (ref 135–145)
Total Bilirubin: 0.6 mg/dL (ref 0.2–1.2)
Total Protein: 7.6 g/dL (ref 6.0–8.3)

## 2016-09-03 NOTE — Assessment & Plan Note (Signed)
Per ortho/ pain management

## 2016-09-03 NOTE — Progress Notes (Signed)
Patient ID: Krystal Henderson, female    DOB: 26-May-1932  Age: 83 y.o. MRN: [id number]    Subjective:  Subjective  HPI Krystal Henderson presents for cpe.  No new complaints.  She is seeing Dr Nelva Bush for her compression fracture  Review of Systems  Constitutional: Negative for activity change, appetite change, fatigue, fever and unexpected weight change.  HENT: Negative for congestion.   Respiratory: Negative for cough and shortness of breath.   Cardiovascular: Negative for chest pain, palpitations and leg swelling.  Gastrointestinal: Negative for abdominal pain, blood in stool and nausea.  Genitourinary: Negative for dysuria and frequency.  Musculoskeletal: Positive for back pain and gait problem. Negative for joint swelling, neck pain and neck stiffness.  Skin: Negative for rash.  Allergic/Immunologic: Negative for environmental allergies.  Neurological: Negative for dizziness and headaches.  Psychiatric/Behavioral: Negative for behavioral problems and dysphoric mood. The patient is not nervous/anxious.     History Past Medical History:  Diagnosis Date  . Anemia    hx of   . Angiodysplasia 2007   @ colonoscopy  . Anxiety    PMH of  . Chronic low back pain 06/21/2014  . Diverticulosis of colon (without mention of hemorrhage)   . DJD (degenerative joint disease)   . Esophageal reflux    inactive  . HTN (hypertension)   . Hyperlipemia 2006   LDL 130  . Hypoglycemia    reactive  . Pelvic fracture ([place]) 12/30/2011   [facility]  . Peripheral neuropathy   . Personal history of colonic polyps    adenomatous  . Vitamin B12 deficiency     She has a past surgical history that includes Tear duct probing; Total hip arthroplasty (2000); Facial cosmetic surgery; Tubal ligation; Hemorroidectomy; Dilation and curettage of uterus; Colonoscopy w/ polypectomy; Lumbar laminectomy/decompression microdiscectomy (09/10/2011); ORIF wrist fracture (01/11/2012); cataract surgery (12-26-12 and  01-09-13); and Colonoscopy (2011).   Her family history includes Cancer in her brother; Depression in her maternal uncle; Heart attack (age of onset: 30) in her brother; Heart attack (age of onset: 108) in her father; Kidney disease in her mother; Osteoporosis in her sister; Peripheral vascular disease in her daughter; Stroke (age of onset: 35) in her brother; Throat cancer in her sister.She reports that she has never smoked. She has never used smokeless tobacco. She reports that she drinks about 1.8 oz of alcohol per week . She reports that she does not use drugs.  Current Outpatient Prescriptions on File Prior to Visit  Medication Sig Dispense Refill  . ALPRAZolam (XANAX) 0.25 MG tablet 1 every 8-12 hrs prn , not routinely (Patient taking differently: 0.25-0.5 mg 2 (two) times daily as needed for anxiety or sleep. ) 30 tablet 0  . amLODipine (NORVASC) 5 MG tablet take 1 tablet by mouth once daily 90 tablet 1  . Ascorbic Acid (VITAMIN C) 1000 MG tablet Take 1,000 mg by mouth daily.     [name] aspirin 81 MG tablet Take 81 mg by mouth daily.     . Calcium Carbonate (CALCIUM 600 PO) Take 1 capsule by mouth daily.     . Cholecalciferol (VITAMIN D) 2000 UNITS CAPS Take 1 capsule by mouth daily.     . furosemide (LASIX) 20 MG tablet take 1 tablet by mouth once daily 90 tablet 1  . HYDROcodone-acetaminophen (NORCO/VICODIN) 5-325 MG tablet take 1 tablet by mouth twice a day if needed  0  . losartan (COZAAR) 100 MG tablet take 1 tablet by mouth once  daily 90 tablet 1  . LYRICA 25 MG capsule take 1 capsule by mouth twice a day (MUST LAST 30 DAYS) 60 capsule 5  . meclizine (ANTIVERT) 25 MG tablet take 1/2 to 1 tablet by mouth once daily 30 tablet 0  . Multiple Vitamin (MULTIVITAMIN WITH MINERALS) TABS Take 1 tablet by mouth daily.    . Omega-3 Fatty Acids (FISH OIL) 1000 MG CAPS Take 1 capsule by mouth daily.     [name] Glycol-Propyl Glycol (SYSTANE ULTRA OP) Apply 1 drop to eye daily as needed (for dry  eyes).    . polyethylene glycol (MIRALAX / GLYCOLAX) packet Take 17 g by mouth daily. 10 each 0  . alendronate (FOSAMAX) 70 MG tablet   0  . risedronate (ACTONEL) 35 MG tablet take 1 tablet by mouth every week WITH WATER, ON AN EMPTY STOMACH...  (REFER TO PRESCRIPTION NOTES).  0   No current facility-administered medications on file prior to visit.      Objective:  Objective  Physical Exam  Constitutional: She is oriented to person, place, and time. She appears well-developed and well-nourished.  HENT:  Head: Normocephalic and atraumatic.  Eyes: Conjunctivae and EOM are normal.  Neck: Normal range of motion. Neck supple. No JVD present. Carotid bruit is not present. No thyromegaly present.  Cardiovascular: Normal rate, regular rhythm and normal heart sounds.   No murmur heard. Pulmonary/Chest: Effort normal and breath sounds normal. No respiratory distress. She has no wheezes. She has no rales. She exhibits no tenderness.  Musculoskeletal: She exhibits no edema.  Neurological: She is alert and oriented to person, place, and time.  Pt walking with walker due to back pain   Psychiatric: She has a normal mood and affect. Her behavior is normal. Judgment and thought content normal.  Nursing note and vitals reviewed.  BP 134/70 (BP Location: Right Arm, Patient Position: Sitting, Cuff Size: Normal)   Pulse 80   Ht 5\' 5"  (1.651 m)   Wt 180 lb 9.6 oz (81.9 kg)   SpO2 97%   BMI 30.05 kg/m  Wt Readings from Last 3 Encounters:  09/03/16 180 lb 9.6 oz (81.9 kg)  07/22/16 184 lb 12.8 oz (83.8 kg)  06/08/16 182 lb (82.6 kg)     Lab Results  Component Value Date   WBC 7.7 09/03/2016   HGB 12.9 09/03/2016   HCT 39.7 09/03/2016   PLT 339.0 09/03/2016   GLUCOSE 79 09/03/2016   CHOL 213 (H) 09/03/2016   TRIG 68.0 09/03/2016   HDL 78.80 09/03/2016   LDLDIRECT 129.0 10/18/2014   LDLCALC 121 (H) 09/03/2016   ALT 11 09/03/2016   AST 12 09/03/2016   NA 138 09/03/2016   K 4.4 09/03/2016     CL 102 09/03/2016   CREATININE 1.65 (H) 09/03/2016   BUN 25 (H) 09/03/2016   CO2 27 09/03/2016   TSH 3.53 11/26/2013   INR 1.3 ratio (H) 10/29/2009   HGBA1C 5.6 08/24/2011    Dg Bone Density  Result Date: 06/08/2016 EXAM: DUAL X-RAY ABSORPTIOMETRY (DXA) FOR BONE MINERAL DENSITY IMPRESSION: Referring Physician:  [facility] PATIENT: Name: Cheyenne, Bordeaux Patient ID: [id number] Birth Date: Jun 03, 1932 Height: 63.0 in. Sex: Female Measured: 06/08/2016 Weight: 182.0 lbs. Indications: Advanced Age, Caucasian, Height Loss, History of Fracture (Adult), History of Osteoporosis, Post Menopausal Fractures: Foot, Lumbar Spine, Wrist Treatments: Calcium, Estrogen, Fosamax(Alendronate), Multivitamin, Vitamin D ASSESSMENT: The BMD measured at Forearm Radius 33% is 0.614 g/cm2 with a T-score of -3.0. This patient  is considered osteoporotic according to [facility]) criteria. Lumbar spine was not utilized due to surgical changes and compression fracture. The right femur was not scanned due to the patient having a right hip replacement. Site Region Measured Date Measured Age WHO YA BMD Classification T-score Left Femur Neck 06/08/2016 84.0 years Osteopenia -1.8 0.783 g/cm2 Left Forearm Radius 33% 06/08/2016 84.0 [age] Osteoporosis -3.0 0.614 g/cm2 World Health Organization [facility]) criteria for post-menopausal, Caucasian Women: Normal       T-score at or above -1 SD Osteopenia   T-score between -1 and -2.5 SD Osteoporosis T-score at or below -2.5 SD RECOMMENDATION: [place] recommends that FDA-approved medical therapies be considered in postmenopausal women and men age 64 or older with a: 1. Hip or vertebral (clinical or morphometric) fracture. 2. T-score of < -2.5 at the spine or hip. 3. Ten-year fracture probability by FRAX of 3% or greater for hip fracture or 20% or greater for major osteoporotic fracture. All treatment decisions require clinical judgment and consideration  of individual patient factors, including patient preferences, co-morbidities, previous drug use, risk factors not captured in the FRAX model (e.g. falls, vitamin D deficiency, increased bone turnover, interval significant decline in bone density) and possible under - or over-estimation of fracture risk by FRAX. All patients should ensure an adequate intake of dietary calcium (1200 mg/d) and vitamin D (800 IU daily) unless contraindicated. FOLLOW-UP: People with diagnosed cases of osteoporosis or at high risk for fracture should have regular bone mineral density tests. For patients eligible for Medicare, routine testing is allowed once every 2 years. The testing frequency can be increased to one year for patients who have rapidly progressing disease, those who are receiving or discontinuing medical therapy to restore bone mass, or have additional risk factors. I have reviewed this report and agree with the above findings. [facility] Electronically Signed   By: Marijo Conception, M.D.   On: 06/08/2016 16:12     Assessment & Plan:  Plan  I am having Ms. Radabaugh maintain her aspirin, Calcium Carbonate (CALCIUM 600 PO), Fish Oil, vitamin C, Vitamin D, multivitamin with minerals, polyethylene glycol, meclizine, ALPRAZolam, Polyethyl Glycol-Propyl Glycol (SYSTANE ULTRA OP), LYRICA, HYDROcodone-acetaminophen, alendronate, risedronate, losartan, amLODipine, and furosemide.  No orders of the defined types were placed in this encounter.   Problem List Items Addressed This Visit      Unprioritized   Chronic renal insufficiency, stage III (moderate)    Check labs F/u nephrology      Relevant Orders   CBC with Differential/Platelet (Completed)   Comprehensive metabolic panel (Completed)   Essential hypertension    Well controlled, no changes to meds. Encouraged heart healthy diet such as the DASH diet and exercise as tolerated.       Relevant Orders   CBC with Differential/Platelet (Completed)    Comprehensive metabolic panel (Completed)   HYPERLIPIDEMIA    Tolerating statin, encouraged heart healthy diet, avoid trans fats, minimize simple carbs and saturated fats. Increase exercise as tolerated      Relevant Orders   CBC with Differential/Platelet (Completed)   Lipid panel (Completed)   Comprehensive metabolic panel (Completed)    Other Visit Diagnoses    Preventative health care    -  Primary   Encounter for Medicare annual wellness exam          Follow-up: No Follow-up on file.  Ann Held, DO

## 2016-09-03 NOTE — Assessment & Plan Note (Signed)
Check labs F/u nephrology 

## 2016-09-03 NOTE — Patient Instructions (Signed)
Krystal Henderson , Thank you for taking time to come for your Medicare Wellness Visit. I appreciate your ongoing commitment to your health goals. Please review the following plan we discussed and let me know if I can assist you in the future.   These are the goals we discussed: Goals      Patient Stated   . Have decreased back pain  (pt-stated)       This is a list of the screening recommended for you and due dates:  Health Maintenance  Topic Date Due  . Flu Shot  11/03/2016  . Tetanus Vaccine  03/14/2021  . DEXA scan (bone density measurement)  Completed  . Pneumonia vaccines  Completed    Continue to eat heart healthy diet (full of fruits, vegetables, whole grains, lean protein, water--limit salt, fat, and sugar intake) and increase physical activity as tolerated.  Continue doing brain stimulating activities (puzzles, reading, adult coloring books, staying active) to keep memory sharp.   Bring a copy of your advance directives to your next office visit.  Health Maintenance for Postmenopausal Women Menopause is a normal process in which your reproductive ability comes to an end. This process happens gradually over a span of months to years, usually between the ages of 44 and 17. Menopause is complete when you have missed 12 consecutive menstrual periods. It is important to talk with your health care provider about some of the most common conditions that affect postmenopausal women, such as heart disease, cancer, and bone loss (osteoporosis). Adopting a healthy lifestyle and getting preventive care can help to promote your health and wellness. Those actions can also lower your chances of developing some of these common conditions. What should I know about menopause? During menopause, you may experience a number of symptoms, such as:  Moderate-to-severe hot flashes.  Night sweats.  Decrease in sex drive.  Mood swings.  Headaches.  Tiredness.  Irritability.  Memory  problems.  Insomnia.  Choosing to treat or not to treat menopausal changes is an individual decision that you make with your health care provider. What should I know about hormone replacement therapy and supplements? Hormone therapy products are effective for treating symptoms that are associated with menopause, such as hot flashes and night sweats. Hormone replacement carries certain risks, especially as you become older. If you are thinking about using estrogen or estrogen with progestin treatments, discuss the benefits and risks with your health care provider. What should I know about heart disease and stroke? Heart disease, heart attack, and stroke become more likely as you age. This may be due, in part, to the hormonal changes that your body experiences during menopause. These can affect how your body processes dietary fats, triglycerides, and cholesterol. Heart attack and stroke are both medical emergencies. There are many things that you can do to help prevent heart disease and stroke:  Have your blood pressure checked at least every 1-2 years. High blood pressure causes heart disease and increases the risk of stroke.  If you are 69-64 years old, ask your health care provider if you should take aspirin to prevent a heart attack or a stroke.  Do not use any tobacco products, including cigarettes, chewing tobacco, or electronic cigarettes. If you need help quitting, ask your health care provider.  It is important to eat a healthy diet and maintain a healthy weight. ? Be sure to include plenty of vegetables, fruits, low-fat dairy products, and lean protein. ? Avoid eating foods that are high in  solid fats, added sugars, or salt (sodium).  Get regular exercise. This is one of the most important things that you can do for your health. ? Try to exercise for at least 150 minutes each week. The type of exercise that you do should increase your heart rate and make you sweat. This is known as  moderate-intensity exercise. ? Try to do strengthening exercises at least twice each week. Do these in addition to the moderate-intensity exercise.  Know your numbers.Ask your health care provider to check your cholesterol and your blood glucose. Continue to have your blood tested as directed by your health care provider.  What should I know about cancer screening? There are several types of cancer. Take the following steps to reduce your risk and to catch any cancer development as early as possible. Breast Cancer  Practice breast self-awareness. ? This means understanding how your breasts normally appear and feel. ? It also means doing regular breast self-exams. Let your health care provider know about any changes, no matter how small.  If you are 72 or older, have a clinician do a breast exam (clinical breast exam or CBE) every year. Depending on your age, family history, and medical history, it may be recommended that you also have a yearly breast X-ray (mammogram).  If you have a family history of breast cancer, talk with your health care provider about genetic screening.  If you are at high risk for breast cancer, talk with your health care provider about having an MRI and a mammogram every year.  Breast cancer (BRCA) gene test is recommended for women who have family members with BRCA-related cancers. Results of the assessment will determine the need for genetic counseling and BRCA1 and for BRCA2 testing. BRCA-related cancers include these types: ? Breast. This occurs in males or females. ? Ovarian. ? Tubal. This may also be called fallopian tube cancer. ? Cancer of the abdominal or pelvic lining (peritoneal cancer). ? Prostate. ? Pancreatic.  Cervical, Uterine, and Ovarian Cancer Your health care provider may recommend that you be screened regularly for cancer of the pelvic organs. These include your ovaries, uterus, and vagina. This screening involves a pelvic exam, which  includes checking for microscopic changes to the surface of your cervix (Pap test).  For women ages 21-65, health care providers may recommend a pelvic exam and a Pap test every three years. For women ages 32-65, they may recommend the Pap test and pelvic exam, combined with testing for human papilloma virus (HPV), every five years. Some types of HPV increase your risk of cervical cancer. Testing for HPV may also be done on women of any age who have unclear Pap test results.  Other health care providers may not recommend any screening for nonpregnant women who are considered low risk for pelvic cancer and have no symptoms. Ask your health care provider if a screening pelvic exam is right for you.  If you have had past treatment for cervical cancer or a condition that could lead to cancer, you need Pap tests and screening for cancer for at least 20 years after your treatment. If Pap tests have been discontinued for you, your risk factors (such as having a new sexual partner) need to be reassessed to determine if you should start having screenings again. Some women have medical problems that increase the chance of getting cervical cancer. In these cases, your health care provider may recommend that you have screening and Pap tests more often.  If you have  a family history of uterine cancer or ovarian cancer, talk with your health care provider about genetic screening.  If you have vaginal bleeding after reaching menopause, tell your health care provider.  There are currently no reliable tests available to screen for ovarian cancer.  Lung Cancer Lung cancer screening is recommended for adults 48-55 years old who are at high risk for lung cancer because of a history of smoking. A yearly low-dose CT scan of the lungs is recommended if you:  Currently smoke.  Have a history of at least 30 pack-years of smoking and you currently smoke or have quit within the past 15 years. A pack-year is smoking an  average of one pack of cigarettes per day for one year.  Yearly screening should:  Continue until it has been 15 years since you quit.  Stop if you develop a health problem that would prevent you from having lung cancer treatment.  Colorectal Cancer  This type of cancer can be detected and can often be prevented.  Routine colorectal cancer screening usually begins at age 36 and continues through age 74.  If you have risk factors for colon cancer, your health care provider may recommend that you be screened at an earlier age.  If you have a family history of colorectal cancer, talk with your health care provider about genetic screening.  Your health care provider may also recommend using home test kits to check for hidden blood in your stool.  A small camera at the end of a tube can be used to examine your colon directly (sigmoidoscopy or colonoscopy). This is done to check for the earliest forms of colorectal cancer.  Direct examination of the colon should be repeated every 5-10 years until age 64. However, if early forms of precancerous polyps or small growths are found or if you have a family history or genetic risk for colorectal cancer, you may need to be screened more often.  Skin Cancer  Check your skin from head to toe regularly.  Monitor any moles. Be sure to tell your health care provider: ? About any new moles or changes in moles, especially if there is a change in a mole's shape or color. ? If you have a mole that is larger than the size of a pencil eraser.  If any of your family members has a history of skin cancer, especially at a young age, talk with your health care provider about genetic screening.  Always use sunscreen. Apply sunscreen liberally and repeatedly throughout the day.  Whenever you are outside, protect yourself by wearing long sleeves, pants, a wide-brimmed hat, and sunglasses.  What should I know about osteoporosis? Osteoporosis is a condition in  which bone destruction happens more quickly than new bone creation. After menopause, you may be at an increased risk for osteoporosis. To help prevent osteoporosis or the bone fractures that can happen because of osteoporosis, the following is recommended:  If you are 46-75 years old, get at least 1,000 mg of calcium and at least 600 mg of vitamin D per day.  If you are older than age 23 but younger than age 68, get at least 1,200 mg of calcium and at least 600 mg of vitamin D per day.  If you are older than age 66, get at least 1,200 mg of calcium and at least 800 mg of vitamin D per day.  Smoking and excessive alcohol intake increase the risk of osteoporosis. Eat foods that are rich in calcium and  vitamin D, and do weight-bearing exercises several times each week as directed by your health care provider. What should I know about how menopause affects my mental health? Depression may occur at any age, but it is more common as you become older. Common symptoms of depression include:  Low or sad mood.  Changes in sleep patterns.  Changes in appetite or eating patterns.  Feeling an overall lack of motivation or enjoyment of activities that you previously enjoyed.  Frequent crying spells.  Talk with your health care provider if you think that you are experiencing depression. What should I know about immunizations? It is important that you get and maintain your immunizations. These include:  Tetanus, diphtheria, and pertussis (Tdap) booster vaccine.  Influenza every year before the flu season begins.  Pneumonia vaccine.  Shingles vaccine.  Your health care provider may also recommend other immunizations. This information is not intended to replace advice given to you by your health care provider. Make sure you discuss any questions you have with your health care provider. Document Released: 05/14/2005 Document Revised: 10/10/2015 Document Reviewed: 12/24/2014 Elsevier Interactive  Patient Education  2018 [employer].

## 2016-09-03 NOTE — Assessment & Plan Note (Signed)
Well controlled, no changes to meds. Encouraged heart healthy diet such as the DASH diet and exercise as tolerated.  °

## 2016-09-03 NOTE — Assessment & Plan Note (Signed)
Tolerating statin, encouraged heart healthy diet, avoid trans fats, minimize simple carbs and saturated fats. Increase exercise as tolerated 

## 2016-09-06 ENCOUNTER — Other Ambulatory Visit: Payer: Self-pay | Admitting: Family Medicine

## 2016-09-06 DIAGNOSIS — E785 Hyperlipidemia, unspecified: Secondary | ICD-10-CM

## 2016-09-07 DIAGNOSIS — M5136 Other intervertebral disc degeneration, lumbar region: Secondary | ICD-10-CM | POA: Diagnosis not present

## 2016-09-07 DIAGNOSIS — M47816 Spondylosis without myelopathy or radiculopathy, lumbar region: Secondary | ICD-10-CM | POA: Diagnosis not present

## 2016-09-07 DIAGNOSIS — M961 Postlaminectomy syndrome, not elsewhere classified: Secondary | ICD-10-CM | POA: Diagnosis not present

## 2016-10-05 DIAGNOSIS — M47816 Spondylosis without myelopathy or radiculopathy, lumbar region: Secondary | ICD-10-CM | POA: Diagnosis not present

## 2016-10-15 ENCOUNTER — Other Ambulatory Visit: Payer: Self-pay | Admitting: Family Medicine

## 2016-10-15 DIAGNOSIS — F419 Anxiety disorder, unspecified: Secondary | ICD-10-CM

## 2016-10-15 NOTE — Telephone Encounter (Signed)
Last refill #30 no refills on 07/22/15 Last office visit on 09/03/2016 No contract/no uds

## 2016-10-15 NOTE — Telephone Encounter (Signed)
Faxed hardcopy for Alprazolam to [facility] [address] [place]

## 2016-11-16 DIAGNOSIS — M5136 Other intervertebral disc degeneration, lumbar region: Secondary | ICD-10-CM | POA: Diagnosis not present

## 2016-11-16 DIAGNOSIS — M47816 Spondylosis without myelopathy or radiculopathy, lumbar region: Secondary | ICD-10-CM | POA: Diagnosis not present

## 2016-11-16 DIAGNOSIS — M961 Postlaminectomy syndrome, not elsewhere classified: Secondary | ICD-10-CM | POA: Diagnosis not present

## 2017-01-07 ENCOUNTER — Other Ambulatory Visit: Payer: Self-pay | Admitting: Neurology

## 2017-01-07 DIAGNOSIS — G609 Hereditary and idiopathic neuropathy, unspecified: Secondary | ICD-10-CM

## 2017-01-07 NOTE — Telephone Encounter (Signed)
Faxed printed/signed rx's for lyrica to pt pharmacy at [phone]. Received fax confirmation.

## 2017-01-27 ENCOUNTER — Ambulatory Visit: Payer: Self-pay | Admitting: Adult Health

## 2017-02-06 ENCOUNTER — Other Ambulatory Visit: Payer: Self-pay | Admitting: Family Medicine

## 2017-02-06 DIAGNOSIS — I10 Essential (primary) hypertension: Secondary | ICD-10-CM

## 2017-02-08 ENCOUNTER — Other Ambulatory Visit: Payer: Self-pay | Admitting: Family Medicine

## 2017-02-08 DIAGNOSIS — I10 Essential (primary) hypertension: Secondary | ICD-10-CM

## 2017-02-08 DIAGNOSIS — R600 Localized edema: Secondary | ICD-10-CM

## 2017-02-10 ENCOUNTER — Other Ambulatory Visit: Payer: Self-pay | Admitting: Family Medicine

## 2017-02-10 DIAGNOSIS — I10 Essential (primary) hypertension: Secondary | ICD-10-CM

## 2017-03-11 ENCOUNTER — Other Ambulatory Visit (INDEPENDENT_AMBULATORY_CARE_PROVIDER_SITE_OTHER): Payer: Medicare Other

## 2017-03-11 DIAGNOSIS — E785 Hyperlipidemia, unspecified: Secondary | ICD-10-CM | POA: Diagnosis not present

## 2017-03-11 LAB — COMPREHENSIVE METABOLIC PANEL
ALBUMIN: 4.2 g/dL (ref 3.5–5.2)
ALK PHOS: 50 U/L (ref 39–117)
ALT: 12 U/L (ref 0–35)
AST: 16 U/L (ref 0–37)
BUN: 27 mg/dL — AB (ref 6–23)
CO2: 26 mEq/L (ref 19–32)
CREATININE: 1.99 mg/dL — AB (ref 0.40–1.20)
Calcium: 9.9 mg/dL (ref 8.4–10.5)
Chloride: 103 mEq/L (ref 96–112)
GFR: 25.45 mL/min — ABNORMAL LOW (ref >60.00)
Glucose, Bld: 81 mg/dL (ref 70–99)
Potassium: 4.2 mEq/L (ref 3.5–5.1)
SODIUM: 139 meq/L (ref 135–145)
TOTAL PROTEIN: 7.4 g/dL (ref 6.0–8.3)
Total Bilirubin: 0.6 mg/dL (ref 0.2–1.2)

## 2017-03-11 LAB — LIPID PANEL
CHOLESTEROL: 212 mg/dL — AB (ref 0–200)
HDL: 76.2 mg/dL (ref >39.00)
LDL Cholesterol: 121 mg/dL — ABNORMAL HIGH (ref 0–99)
NonHDL: 135.61
TRIGLYCERIDES: 74 mg/dL (ref 0.0–149.0)
Total CHOL/HDL Ratio: 3
VLDL: 14.8 mg/dL (ref 0.0–40.0)

## 2017-03-18 DIAGNOSIS — M5136 Other intervertebral disc degeneration, lumbar region: Secondary | ICD-10-CM | POA: Diagnosis not present

## 2017-03-18 DIAGNOSIS — M961 Postlaminectomy syndrome, not elsewhere classified: Secondary | ICD-10-CM | POA: Diagnosis not present

## 2017-04-01 DIAGNOSIS — H0015 Chalazion left lower eyelid: Secondary | ICD-10-CM | POA: Diagnosis not present

## 2017-04-27 ENCOUNTER — Ambulatory Visit: Payer: Medicare Other | Admitting: Adult Health

## 2017-04-27 ENCOUNTER — Encounter: Payer: Self-pay | Admitting: Adult Health

## 2017-04-27 DIAGNOSIS — G609 Hereditary and idiopathic neuropathy, unspecified: Secondary | ICD-10-CM | POA: Diagnosis not present

## 2017-04-27 MED ORDER — PREGABALIN 100 MG PO CAPS: ORAL_CAPSULE | ORAL | 5 refills | Status: DC

## 2017-04-27 MED ORDER — PREGABALIN 25 MG PO CAPS
ORAL_CAPSULE | ORAL | 5 refills | Status: DC
Start: 2017-04-27 — End: 2018-01-02

## 2017-04-27 NOTE — Progress Notes (Signed)
I have read the note, and I agree with the clinical assessment and plan.  Charles K Willis   

## 2017-04-27 NOTE — Patient Instructions (Addendum)
Your Plan:  Continue Lyrica If your symptoms worsen or you develop new symptoms please let us know.   Thank you for coming to see [place] at [facility]. I hope we have been able to provide you high quality care today.  You may receive a patient satisfaction survey over the next few weeks. We would appreciate your feedback and comments so that we may continue to improve ourselves and the health of our patients.

## 2017-04-27 NOTE — Progress Notes (Signed)
Lyrica refill Rx 100 mg and 25 mg  successfully faxed to [facility], [place]

## 2017-04-27 NOTE — Progress Notes (Signed)
PATIENT: Krystal Henderson DOB: [birth year]  REASON FOR VISIT: follow up HISTORY FROM: patient  HISTORY OF PRESENT ILLNESS: Today 04/27/17 Krystal Henderson is an 84 year old female with a history of chronic low back pain and peripheral neuropathy.  She returns today for follow-up.  She states that Lyrica does work well for her.  She states that her main concern is that she will wake up during the night and her feet will be ice cold.  She states because of that she is unable to fall asleep most times.  She reports that she does see pain management for chronic back pain.  She reports that they gave her hydrocodone that she typically only takes 1 tablet a day if that.  She denies any significant changes with her gait or balance.  Denies any falls.  He uses a Radiation protection practitioner when ambulating.  She returns today for an evaluation.  HISTORY Krystal Henderson is an 84 year old female with a history of chronic low back pain and peripheral neuropathy. She returns today for follow-up. She reports that Lyrica is working well for her neuropathy. She states that if she skips a dose she has increased burning in the feet. She denies any changes with her gait or balance. She does state that she now uses a Rollator due to back pain. She has not had any falls. She has seen a surgeon in regards to her compression fracture but due to her age they have refrained from surgery. She is seeing pain management on Monday. She returns today for an evaluation.  HISTORY 01/13/16: Krystal Henderson is an 84 year old right-handed white female with a history of chronic low back pain and a peripheral neuropathy. The patient had a spontaneous L1 compression fracture around 11/04/2015. The patient has been wearing a back support brace, but she still has ongoing discomfort. The pain stays in the back and does not radiate down the legs. The patient has not had any falls, she uses a walker for ambulation. She has had a lot of problems with peripheral edema of  both legs, she is on amlodipine and Lyrica. The swelling of the legs does not go down overnight. She has Tylenol for pain, she no longer has hydrocodone.  REVIEW OF SYSTEMS: Out of a complete 14 system review of symptoms, the patient complains only of the following symptoms, and all other reviewed systems are negative.  See HPI  ALLERGIES: Allergies  Allergen Reactions  . Codeine Nausea Only  . Duloxetine Other (See Comments)    REACTION: intolerance--She does not remember taking this Rx- states she did not feel well   . Sertraline Hcl Other (See Comments)    REACTION: intolerance-- Did not help with Depression    HOME MEDICATIONS: Outpatient Medications Prior to Visit  Medication Sig Dispense Refill  . ALPRAZolam (XANAX) 0.25 MG tablet take 1 tablet by mouth every 8 to 12 hours if needed 30 tablet 0  . amLODipine (NORVASC) 5 MG tablet Take 1 tablet (5 mg total) daily by mouth. 90 tablet 1  . Ascorbic Acid (VITAMIN C) 1000 MG tablet Take 1,000 mg by mouth daily.     [name] aspirin 81 MG tablet Take 81 mg by mouth daily.     . Calcium Carbonate (CALCIUM 600 PO) Take 1 capsule by mouth daily.     . Cholecalciferol (VITAMIN D) 2000 UNITS CAPS Take 1 capsule by mouth daily.     . furosemide (LASIX) 20 MG tablet take 1 tablet by mouth once daily 90  tablet 1  . HYDROcodone-acetaminophen (NORCO/VICODIN) 5-325 MG tablet take 1 tablet by mouth twice a day if needed  0  . losartan (COZAAR) 100 MG tablet take 1 tablet by mouth once daily 90 tablet 1  . LYRICA 100 MG capsule take 1 capsule by mouth at bedtime (MUST LAST 30 DAYS) 30 capsule 5  . LYRICA 25 MG capsule take 1 capsule by mouth twice a day (MUST LAST 30 DAYS) 60 capsule 5  . meclizine (ANTIVERT) 25 MG tablet take 1/2 to 1 tablet by mouth once daily 30 tablet 0  . Multiple Vitamin (MULTIVITAMIN WITH MINERALS) TABS Take 1 tablet by mouth daily.    . Omega-3 Fatty Acids (FISH OIL) 1000 MG CAPS Take 1 capsule by mouth daily.     [name]  Glycol-Propyl Glycol (SYSTANE ULTRA OP) Apply 1 drop to eye daily as needed (for dry eyes).    . polyethylene glycol (MIRALAX / GLYCOLAX) packet Take 17 g by mouth daily. 10 each 0  . alendronate (FOSAMAX) 70 MG tablet   0  . risedronate (ACTONEL) 35 MG tablet take 1 tablet by mouth every week WITH WATER, ON AN EMPTY STOMACH...  (REFER TO PRESCRIPTION NOTES).  0   No facility-administered medications prior to visit.     PAST MEDICAL HISTORY: Past Medical History:  Diagnosis Date  . Anemia    hx of   . Angiodysplasia 2007   @ colonoscopy  . Anxiety    PMH of  . Chronic low back pain 06/21/2014  . Diverticulosis of colon (without mention of hemorrhage)   . DJD (degenerative joint disease)   . Esophageal reflux    inactive  . History of vertebral fracture 11/2015  . HTN (hypertension)   . Hyperlipemia 2006   LDL 130  . Hypoglycemia    reactive  . Pelvic fracture ([place]) 12/30/2011   [facility]  . Peripheral neuropathy   . Personal history of colonic polyps    adenomatous  . Vitamin B12 deficiency     PAST SURGICAL HISTORY: Past Surgical History:  Procedure Laterality Date  . cataract surgery  12-26-12 and 01-09-13  . COLONOSCOPY  2011   neg  . COLONOSCOPY W/ POLYPECTOMY     X 2 , Dr  Verl Blalock; angiodysplasia. Due 2022  . DILATION AND CURETTAGE OF UTERUS    . FACIAL COSMETIC SURGERY    . HEMORROIDECTOMY    . LUMBAR LAMINECTOMY/DECOMPRESSION MICRODISCECTOMY  09/10/2011   Procedure: LUMBAR LAMINECTOMY/DECOMPRESSION MICRODISCECTOMY;  Surgeon: Johnn Hai, MD;  Location: [facility];  Service: Orthopedics;  Laterality: N/A;  decompression laminectomy L2-3, L3-4, L4-5  . ORIF WRIST FRACTURE  01/11/2012   Procedure: OPEN REDUCTION INTERNAL FIXATION (ORIF) WRIST FRACTURE;  Surgeon: Tennis Must, MD;  Location: [place];  Service: Orthopedics;  Laterality: Right;  . TEAR DUCT PROBING     X 2  . TOTAL HIP ARTHROPLASTY  2000   right  . TUBAL LIGATION       FAMILY HISTORY: Family History  Problem Relation Age of Onset  . Heart attack Father 1  . Kidney disease Mother        renal failure  . Throat cancer Sister        smoker  . Osteoporosis Sister   . Heart attack Brother 43  . Stroke Brother 36       smoker  . Depression Maternal Uncle   . Cancer Brother        X3  lung cancer, all smokers  . Peripheral vascular disease Daughter   . Colon cancer Neg Hx   . Diabetes Neg Hx     SOCIAL HISTORY: Social History   Socioeconomic History  . Marital status: Married    Spouse name: Not on file  . Number of children: 1  . Years of education: hs  . Highest education level: Not on file  Social Needs  . Financial resource strain: Not on file  . Food insecurity - worry: Not on file  . Food insecurity - inability: Not on file  . Transportation needs - medical: Not on file  . Transportation needs - non-medical: Not on file  Occupational History  . Occupation: retired    Fish farm manager: RETIRED  Tobacco Use  . Smoking status: Never Smoker  . Smokeless tobacco: Never Used  Substance and Sexual Activity  . Alcohol use: Yes    Alcohol/week: 1.8 oz    Types: 3 Glasses of wine per week    Comment: wine occasionally  . Drug use: No  . Sexual activity: Yes    Birth control/protection: Diaphragm  Other Topics Concern  . Not on file  Social History Narrative   Patient is right handed.   Patient does not drink caffeine.      PHYSICAL EXAM  Vitals:   04/27/17 1308  BP: (!) 142/69  Pulse: 65  Weight: 184 lb 9.6 oz (83.7 kg)   Body mass index is 30.72 kg/m.  Generalized: Well developed, in no acute distress   Neurological examination  Mentation: Alert oriented to time, place, history taking. Follows all commands speech and language fluent Cranial nerve II-XII: Pupils were equal round reactive to light. Extraocular movements were full, visual field were full on confrontational test. Facial sensation and strength were normal.  Uvula tongue midline. Head turning and shoulder shrug  were normal and symmetric. Motor: The motor testing reveals 5 over 5 strength of all 4 extremities. Good symmetric motor tone is noted throughout.  Sensory: Sensory testing is intact to soft touch on all 4 extremities. No evidence of extinction is noted.  Coordination: Cerebellar testing reveals good finger-nose-finger and heel-to-shin bilaterally.  Gait and station: Gait is normal.  Reflexes: Deep tendon reflexes are symmetric and normal bilaterally.   DIAGNOSTIC DATA (LABS, IMAGING, TESTING) - I reviewed patient records, labs, notes, testing and imaging myself where available.  Lab Results  Component Value Date   WBC 7.7 09/03/2016   HGB 12.9 09/03/2016   HCT 39.7 09/03/2016   MCV 95.5 09/03/2016   PLT 339.0 09/03/2016      Component Value Date/Time   NA 139 03/11/2017 1319   K 4.2 03/11/2017 1319   CL 103 03/11/2017 1319   CO2 26 03/11/2017 1319   GLUCOSE 81 03/11/2017 1319   BUN 27 (H) 03/11/2017 1319   CREATININE 1.99 (H) 03/11/2017 1319   CALCIUM 9.9 03/11/2017 1319   PROT 7.4 03/11/2017 1319   ALBUMIN 4.2 03/11/2017 1319   AST 16 03/11/2017 1319   ALT 12 03/11/2017 1319   ALKPHOS 50 03/11/2017 1319   BILITOT 0.6 03/11/2017 1319   GFRNONAA 28 (L) 11/04/2015 0932   GFRAA 32 (L) 11/04/2015 0932   Lab Results  Component Value Date   CHOL 212 (H) 03/11/2017   HDL 76.20 03/11/2017   LDLCALC 121 (H) 03/11/2017   LDLDIRECT 129.0 10/18/2014   TRIG 74.0 03/11/2017   CHOLHDL 3 03/11/2017    Lab Results  Component Value Date   VITAMINB12 367  04/21/2015   Lab Results  Component Value Date   TSH 3.53 11/26/2013      ASSESSMENT AND PLAN 84 y.o. year old female  has a past medical history of Anemia, Angiodysplasia (2007), Anxiety, Chronic low back pain (06/21/2014), Diverticulosis of colon (without mention of hemorrhage), DJD (degenerative joint disease), Esophageal reflux, History of vertebral fracture (11/2015),  HTN (hypertension), Hyperlipemia (2006), Hypoglycemia, Pelvic fracture ([place]) (12/30/2011), Peripheral neuropathy, Personal history of colonic polyps, and Vitamin B12 deficiency. here with :  1.  Peripheral neuropathy  Overall the patient is doing well.  She will continue on Lyrica 25 mg in the morning and at noon and 100 mg at bedtime.  Advised the patient that she could potentially try wearing additional pair of socks when she sleeps to help her feel more comfortable.  She is advised that if her symptoms worsen or she develops new symptoms she should let us know.  She will follow-up in 6 months or sooner if needed.  I spent 15 minutes with the patient. 50% of this time was spent discussing her medication and symptoms.   Ward Givens, MSN, NP-C 04/27/2017, 1:18 PM [facility] [address], [place], [place] [address] [phone]

## 2017-05-02 DIAGNOSIS — L570 Actinic keratosis: Secondary | ICD-10-CM | POA: Diagnosis not present

## 2017-05-02 DIAGNOSIS — Q828 Other specified congenital malformations of skin: Secondary | ICD-10-CM | POA: Diagnosis not present

## 2017-05-02 DIAGNOSIS — L820 Inflamed seborrheic keratosis: Secondary | ICD-10-CM | POA: Diagnosis not present

## 2017-06-14 ENCOUNTER — Encounter: Payer: Self-pay | Admitting: Adult Health

## 2017-07-12 ENCOUNTER — Other Ambulatory Visit: Payer: Self-pay | Admitting: Adult Health

## 2017-07-12 DIAGNOSIS — G609 Hereditary and idiopathic neuropathy, unspecified: Secondary | ICD-10-CM

## 2017-07-15 DIAGNOSIS — Z79891 Long term (current) use of opiate analgesic: Secondary | ICD-10-CM | POA: Diagnosis not present

## 2017-07-15 DIAGNOSIS — M545 Low back pain: Secondary | ICD-10-CM | POA: Diagnosis not present

## 2017-07-15 DIAGNOSIS — M961 Postlaminectomy syndrome, not elsewhere classified: Secondary | ICD-10-CM | POA: Insufficient documentation

## 2017-08-04 DIAGNOSIS — H52203 Unspecified astigmatism, bilateral: Secondary | ICD-10-CM | POA: Diagnosis not present

## 2017-08-04 DIAGNOSIS — H31012 Macula scars of posterior pole (postinflammatory) (post-traumatic), left eye: Secondary | ICD-10-CM | POA: Diagnosis not present

## 2017-08-04 DIAGNOSIS — H04123 Dry eye syndrome of bilateral lacrimal glands: Secondary | ICD-10-CM | POA: Diagnosis not present

## 2017-08-04 DIAGNOSIS — Z961 Presence of intraocular lens: Secondary | ICD-10-CM | POA: Diagnosis not present

## 2017-08-08 ENCOUNTER — Other Ambulatory Visit: Payer: Self-pay | Admitting: Family Medicine

## 2017-08-08 DIAGNOSIS — I10 Essential (primary) hypertension: Secondary | ICD-10-CM

## 2017-08-08 DIAGNOSIS — R600 Localized edema: Secondary | ICD-10-CM

## 2017-09-05 ENCOUNTER — Ambulatory Visit: Payer: Medicare Other | Admitting: *Deleted

## 2017-10-18 DIAGNOSIS — B0239 Other herpes zoster eye disease: Secondary | ICD-10-CM | POA: Diagnosis not present

## 2017-10-18 DIAGNOSIS — L304 Erythema intertrigo: Secondary | ICD-10-CM | POA: Diagnosis not present

## 2017-10-18 DIAGNOSIS — B029 Zoster without complications: Secondary | ICD-10-CM | POA: Diagnosis not present

## 2017-11-03 ENCOUNTER — Ambulatory Visit: Payer: Self-pay | Admitting: Adult Health

## 2017-11-05 ENCOUNTER — Other Ambulatory Visit: Payer: Self-pay | Admitting: Family Medicine

## 2017-11-05 DIAGNOSIS — I10 Essential (primary) hypertension: Secondary | ICD-10-CM

## 2017-11-05 DIAGNOSIS — R600 Localized edema: Secondary | ICD-10-CM

## 2017-12-22 ENCOUNTER — Ambulatory Visit (INDEPENDENT_AMBULATORY_CARE_PROVIDER_SITE_OTHER): Payer: Medicare Other | Admitting: Family Medicine

## 2017-12-22 ENCOUNTER — Other Ambulatory Visit: Payer: Self-pay | Admitting: Family Medicine

## 2017-12-22 ENCOUNTER — Encounter: Payer: Self-pay | Admitting: Family Medicine

## 2017-12-22 VITALS — BP 134/51 | HR 70 | Temp 97.9°F | Resp 16 | Ht 65.0 in | Wt 179.2 lb

## 2017-12-22 DIAGNOSIS — F419 Anxiety disorder, unspecified: Secondary | ICD-10-CM

## 2017-12-22 DIAGNOSIS — J302 Other seasonal allergic rhinitis: Secondary | ICD-10-CM | POA: Diagnosis not present

## 2017-12-22 DIAGNOSIS — R0602 Shortness of breath: Secondary | ICD-10-CM | POA: Diagnosis not present

## 2017-12-22 DIAGNOSIS — E785 Hyperlipidemia, unspecified: Secondary | ICD-10-CM | POA: Diagnosis not present

## 2017-12-22 DIAGNOSIS — N183 Chronic kidney disease, stage 3 unspecified: Secondary | ICD-10-CM

## 2017-12-22 DIAGNOSIS — R601 Generalized edema: Secondary | ICD-10-CM | POA: Diagnosis not present

## 2017-12-22 DIAGNOSIS — Z23 Encounter for immunization: Secondary | ICD-10-CM | POA: Diagnosis not present

## 2017-12-22 DIAGNOSIS — M5441 Lumbago with sciatica, right side: Secondary | ICD-10-CM

## 2017-12-22 LAB — POC URINALSYSI DIPSTICK (AUTOMATED)
BILIRUBIN UA: NEGATIVE
Blood, UA: NEGATIVE
Glucose, UA: NEGATIVE
KETONES UA: NEGATIVE
Leukocytes, UA: NEGATIVE
Nitrite, UA: NEGATIVE
PH UA: 6 (ref 5.0–8.0)
PROTEIN UA: NEGATIVE
Spec Grav, UA: 1.015 (ref 1.010–1.025)
Urobilinogen, UA: 0.2 E.U./dL

## 2017-12-22 MED ORDER — FLUTICASONE PROPIONATE 50 MCG/ACT NA SUSP: 2.0000 | Freq: Every day | NASAL | 6 refills | Status: DC

## 2017-12-22 MED ORDER — ALPRAZOLAM 0.25 MG PO TABS: ORAL_TABLET | ORAL | 1 refills | Status: DC

## 2017-12-22 MED ORDER — NAPROXEN 500 MG PO TABS: 500.0000 mg | ORAL_TABLET | Freq: Two times a day (BID) | ORAL | 0 refills | Status: DC

## 2017-12-22 MED ORDER — ALPRAZOLAM 0.25 MG PO TABS: ORAL_TABLET | ORAL | 0 refills | Status: DC

## 2017-12-22 MED ORDER — LORATADINE 10 MG PO TABS: 10.0000 mg | ORAL_TABLET | Freq: Every day | ORAL | 11 refills | Status: DC

## 2017-12-22 NOTE — Patient Instructions (Signed)
Preventive Care 82 Years and Older, Female Preventive care refers to lifestyle choices and visits with your health care provider that can promote health and wellness. What does preventive care include?  A yearly physical exam. This is also called an annual well check.  Dental exams once or twice a year.  Routine eye exams. Ask your health care provider how often you should have your eyes checked.  Personal lifestyle choices, including: ? Daily care of your teeth and gums. ? Regular physical activity. ? Eating a healthy diet. ? Avoiding tobacco and drug use. ? Limiting alcohol use. ? Practicing safe sex. ? Taking low-dose aspirin every day. ? Taking vitamin and mineral supplements as recommended by your health care provider. What happens during an annual well check? The services and screenings done by your health care provider during your annual well check will depend on your age, overall health, lifestyle risk factors, and family history of disease. Counseling Your health care provider may ask you questions about your:  Alcohol use.  Tobacco use.  Drug use.  Emotional well-being.  Home and relationship well-being.  Sexual activity.  Eating habits.  History of falls.  Memory and ability to understand (cognition).  Work and work environment.  Reproductive health.  Screening You may have the following tests or measurements:  Height, weight, and BMI.  Blood pressure.  Lipid and cholesterol levels. These may be checked every 5 years, or more frequently if you are over 50 years old.  Skin check.  Lung cancer screening. You may have this screening every year starting at age 55 if you have a 30-pack-year history of smoking and currently smoke or have quit within the past 15 years.  Fecal occult blood test (FOBT) of the stool. You may have this test every year starting at age 50.  Flexible sigmoidoscopy or colonoscopy. You may have a sigmoidoscopy every 5 years or  a colonoscopy every 10 years starting at age 50.  Hepatitis C blood test.  Hepatitis B blood test.  Sexually transmitted disease (STD) testing.  Diabetes screening. This is done by checking your blood sugar (glucose) after you have not eaten for a while (fasting). You may have this done every 1-3 years.  Bone density scan. This is done to screen for osteoporosis. You may have this done starting at age 65.  Mammogram. This may be done every 1-2 years. Talk to your health care provider about how often you should have regular mammograms.  Talk with your health care provider about your test results, treatment options, and if necessary, the need for more tests. Vaccines Your health care provider may recommend certain vaccines, such as:  Influenza vaccine. This is recommended every year.  Tetanus, diphtheria, and acellular pertussis (Tdap, Td) vaccine. You may need a Td booster every 10 years.  Varicella vaccine. You may need this if you have not been vaccinated.  Zoster vaccine. You may need this after age 60.  Measles, mumps, and rubella (MMR) vaccine. You may need at least one dose of MMR if you were born in 1957 or later. You may also need a second dose.  Pneumococcal 13-valent conjugate (PCV13) vaccine. One dose is recommended after age 65.  Pneumococcal polysaccharide (PPSV23) vaccine. One dose is recommended after age 65.  Meningococcal vaccine. You may need this if you have certain conditions.  Hepatitis A vaccine. You may need this if you have certain conditions or if you travel or work in places where you may be exposed to hepatitis   A.  Hepatitis B vaccine. You may need this if you have certain conditions or if you travel or work in places where you may be exposed to hepatitis B.  Haemophilus influenzae type b (Hib) vaccine. You may need this if you have certain conditions.  Talk to your health care provider about which screenings and vaccines you need and how often you  need them. This information is not intended to replace advice given to you by your health care provider. Make sure you discuss any questions you have with your health care provider. Document Released: 04/18/2015 Document Revised: 12/10/2015 Document Reviewed: 01/21/2015 Elsevier Interactive Patient Education  2018 Elsevier Inc.  

## 2017-12-22 NOTE — Progress Notes (Deleted)
Patient ID: Krystal Henderson, female    DOB: 03-Oct-1932  Age: 84 y.o. MRN: [id number]    Subjective:  Subjective  HPI Krystal Henderson presents for ***  Review of Systems  History Past Medical History:  Diagnosis Date  . Anemia    hx of   . Angiodysplasia 2007   @ colonoscopy  . Anxiety    PMH of  . Chronic low back pain 06/21/2014  . Diverticulosis of colon (without mention of hemorrhage)   . DJD (degenerative joint disease)   . Esophageal reflux    inactive  . History of vertebral fracture 11/2015  . HTN (hypertension)   . Hyperlipemia 2006   LDL 130  . Hypoglycemia    reactive  . Pelvic fracture ([place]) 12/30/2011   [facility]  . Peripheral neuropathy   . Personal history of colonic polyps    adenomatous  . Vitamin B12 deficiency     She has a past surgical history that includes Tear duct probing; Total hip arthroplasty (2000); Facial cosmetic surgery; Tubal ligation; Hemorroidectomy; Dilation and curettage of uterus; Colonoscopy w/ polypectomy; Lumbar laminectomy/decompression microdiscectomy (09/10/2011); ORIF wrist fracture (01/11/2012); cataract surgery (12-26-12 and 01-09-13); and Colonoscopy (2011).   Her family history includes Cancer in her brother; Depression in her maternal uncle; Heart attack (age of onset: 38) in her brother; Heart attack (age of onset: 51) in her father; Kidney disease in her mother; Osteoporosis in her sister; Peripheral vascular disease in her daughter; Stroke (age of onset: 41) in her brother; Throat cancer in her sister.She reports that she has never smoked. She has never used smokeless tobacco. She reports that she drinks about 3.0 standard drinks of alcohol per week. She reports that she does not use drugs.  Current Outpatient Medications on File Prior to Visit  Medication Sig Dispense Refill  . alendronate (FOSAMAX) 70 MG tablet   0  . ALPRAZolam (XANAX) 0.25 MG tablet take 1 tablet by mouth every 8 to 12 hours if needed 30 tablet 0  .  amLODipine (NORVASC) 5 MG tablet TAKE 1 TABLET BY MOUTH ONCE DAILY 90 tablet 0  . Ascorbic Acid (VITAMIN C) 1000 MG tablet Take 1,000 mg by mouth daily.     [name] aspirin 81 MG tablet Take 81 mg by mouth daily.     . Calcium Carbonate (CALCIUM 600 PO) Take 1 capsule by mouth daily.     . Cholecalciferol (VITAMIN D) 2000 UNITS CAPS Take 1 capsule by mouth daily.     . furosemide (LASIX) 20 MG tablet TAKE 1 TABLET BY MOUTH ONCE DAILY, NEEDS OFFICE VISIT 90 tablet 0  . HYDROcodone-acetaminophen (NORCO/VICODIN) 5-325 MG tablet take 1 tablet by mouth twice a day if needed  0  . losartan (COZAAR) 100 MG tablet TAKE 1 TABLET BY MOUTH ONCE DAILY 90 tablet 0  . meclizine (ANTIVERT) 25 MG tablet take 1/2 to 1 tablet by mouth once daily 30 tablet 0  . Multiple Vitamin (MULTIVITAMIN WITH MINERALS) TABS Take 1 tablet by mouth daily.    . Omega-3 Fatty Acids (FISH OIL) 1000 MG CAPS Take 1 capsule by mouth daily.     [name] Glycol-Propyl Glycol (SYSTANE ULTRA OP) Apply 1 drop to eye daily as needed (for dry eyes).    . polyethylene glycol (MIRALAX / GLYCOLAX) packet Take 17 g by mouth daily. 10 each 0  . pregabalin (LYRICA) 100 MG capsule take 1 capsule by mouth at bedtime (MUST LAST 30 DAYS) 30 capsule 5  .  pregabalin (LYRICA) 25 MG capsule take 1 capsule by mouth twice a day (MUST LAST 30 DAYS) 60 capsule 5  . risedronate (ACTONEL) 35 MG tablet take 1 tablet by mouth every week WITH WATER, ON AN EMPTY STOMACH...  (REFER TO PRESCRIPTION NOTES).  0   No current facility-administered medications on file prior to visit.      Objective:  Objective  Physical Exam There were no vitals taken for this visit. Wt Readings from Last 3 Encounters:  04/27/17 184 lb 9.6 oz (83.7 kg)  09/03/16 180 lb 9.6 oz (81.9 kg)  07/22/16 184 lb 12.8 oz (83.8 kg)     Lab Results  Component Value Date   WBC 7.7 09/03/2016   HGB 12.9 09/03/2016   HCT 39.7 09/03/2016   PLT 339.0 09/03/2016   GLUCOSE 81 03/11/2017   CHOL  212 (H) 03/11/2017   TRIG 74.0 03/11/2017   HDL 76.20 03/11/2017   LDLDIRECT 129.0 10/18/2014   LDLCALC 121 (H) 03/11/2017   ALT 12 03/11/2017   AST 16 03/11/2017   NA 139 03/11/2017   K 4.2 03/11/2017   CL 103 03/11/2017   CREATININE 1.99 (H) 03/11/2017   BUN 27 (H) 03/11/2017   CO2 26 03/11/2017   TSH 3.53 11/26/2013   INR 1.3 ratio (H) 10/29/2009   HGBA1C 5.6 08/24/2011    Dg Bone Density  Result Date: 06/08/2016 EXAM: DUAL X-RAY ABSORPTIOMETRY (DXA) FOR BONE MINERAL DENSITY IMPRESSION: Referring Physician:  Crosby O[facility] PATIENT: Name: Krystal Henderson, Krystal Henderson Patient ID: [id number] Birth Date: [birth year] Height: 63.0 in. Sex: Female Measured: 06/08/2016 Weight: 182.0 lbs. Indications: Advanced Age, Caucasian, Height Loss, History of Fracture (Adult), History of Osteoporosis, Post Menopausal Fractures: Foot, Lumbar Spine, Wrist Treatments: Calcium, Estrogen, Fosamax(Alendronate), Multivitamin, Vitamin D ASSESSMENT: The BMD measured at Forearm Radius 33% is 0.614 g/cm2 with a T-score of -3.0. This patient is considered osteoporotic according to [facility]) criteria. Lumbar spine was not utilized due to surgical changes and compression fracture. The right femur was not scanned due to the patient having a right hip replacement. Site Region Measured Date Measured Age WHO YA BMD Classification T-score Left Femur Neck 06/08/2016 84.0 years Osteopenia -1.8 0.783 g/cm2 Left Forearm Radius 33% 06/08/2016 [age] Osteoporosis -3.0 0.614 g/cm2 World Health Organization [facility]) criteria for post-menopausal, Caucasian Women: Normal       T-score at or above -1 SD Osteopenia   T-score between -1 and -2.5 SD Osteoporosis T-score at or below -2.5 SD RECOMMENDATION: [facility] recommends that FDA-approved medical therapies be considered in postmenopausal women and men age 61 or older with a: 1. Hip or vertebral (clinical or morphometric) fracture. 2. T-score of < -2.5 at  the spine or hip. 3. Ten-year fracture probability by FRAX of 3% or greater for hip fracture or 20% or greater for major osteoporotic fracture. All treatment decisions require clinical judgment and consideration of individual patient factors, including patient preferences, co-morbidities, previous drug use, risk factors not captured in the FRAX model (e.g. falls, vitamin D deficiency, increased bone turnover, interval significant decline in bone density) and possible under - or over-estimation of fracture risk by FRAX. All patients should ensure an adequate intake of dietary calcium (1200 mg/d) and vitamin D (800 IU daily) unless contraindicated. FOLLOW-UP: People with diagnosed cases of osteoporosis or at high risk for fracture should have regular bone mineral density tests. For patients eligible for Medicare, routine testing is allowed once every 2 years. The testing frequency can be  increased to one year for patients who have rapidly progressing disease, those who are receiving or discontinuing medical therapy to restore bone mass, or have additional risk factors. I have reviewed this report and agree with the above findings. [facility] Electronically Signed   By: Marijo Conception, M.D.   On: 06/08/2016 16:12     Assessment & Plan:  Plan  I am having Krystal Alexandria. Henderson maintain her aspirin, Calcium Carbonate (CALCIUM 600 PO), Fish Oil, vitamin C, Vitamin D, multivitamin with minerals, polyethylene glycol, meclizine, Polyethyl Glycol-Propyl Glycol (SYSTANE ULTRA OP), HYDROcodone-acetaminophen, alendronate, risedronate, ALPRAZolam, pregabalin, pregabalin, amLODipine, losartan, and furosemide.  No orders of the defined types were placed in this encounter.   Problem List Items Addressed This Visit    None      Follow-up: No follow-ups on file.  Ann Held, DO

## 2017-12-22 NOTE — Progress Notes (Signed)
Subjective:     Krystal Henderson is a 84 y.o. female and is here for a comprehensive physical exam. The patient reports no new problems She is struggling with swelling in low ext--- she does not normally sit with her legs up  She is taking lasix refugularly  Social History   Socioeconomic History  . Marital status: Married    Spouse name: Not on file  . Number of children: 1  . Years of education: hs  . Highest education level: Not on file  Occupational History  . Occupation: retired    Fish farm manager: RETIRED  Social Needs  . Financial resource strain: Not on file  . Food insecurity:    Worry: Not on file    Inability: Not on file  . Transportation needs:    Medical: Not on file    Non-medical: Not on file  Tobacco Use  . Smoking status: Never Smoker  . Smokeless tobacco: Never Used  Substance and Sexual Activity  . Alcohol use: Yes    Alcohol/week: 3.0 standard drinks    Types: 3 Glasses of wine per week    Comment: wine occasionally  . Drug use: No  . Sexual activity: Yes    Birth control/protection: Diaphragm  Lifestyle  . Physical activity:    Days per week: Not on file    Minutes per session: Not on file  . Stress: Not on file  Relationships  . Social connections:    Talks on phone: Not on file    Gets together: Not on file    Attends religious service: Not on file    Active member of club or organization: Not on file    Attends meetings of clubs or organizations: Not on file    Relationship status: Not on file  . Intimate partner violence:    Fear of current or ex partner: Not on file    Emotionally abused: Not on file    Physically abused: Not on file    Forced sexual activity: Not on file  Other Topics Concern  . Not on file  Social History Narrative   Patient is right handed.   Patient does not drink caffeine.   Health Maintenance  Topic Date Due  . TETANUS/TDAP  03/14/2021  . INFLUENZA VACCINE  Completed  . DEXA SCAN  Completed  . PNA vac Low Risk  Adult  Completed    The following portions of the patient's history were reviewed and updated as appropriate:  She  has a past medical history of Anemia, Angiodysplasia (2007), Anxiety, Chronic low back pain (06/21/2014), Diverticulosis of colon (without mention of hemorrhage), DJD (degenerative joint disease), Esophageal reflux, History of vertebral fracture (11/2015), HTN (hypertension), Hyperlipemia (2006), Hypoglycemia, Pelvic fracture ([place]) (12/30/2011), Peripheral neuropathy, Personal history of colonic polyps, and Vitamin B12 deficiency. She does not have any pertinent problems on file. She  has a past surgical history that includes Tear duct probing; Total hip arthroplasty (2000); Facial cosmetic surgery; Tubal ligation; Hemorroidectomy; Dilation and curettage of uterus; Colonoscopy w/ polypectomy; Lumbar laminectomy/decompression microdiscectomy (09/10/2011); ORIF wrist fracture (01/11/2012); cataract surgery (12-26-12 and 01-09-13); and Colonoscopy (2011). Her family history includes Cancer in her brother; Depression in her maternal uncle; Heart attack (age of onset: 13) in her brother; Heart attack (age of onset: 64) in her father; Kidney disease in her mother; Osteoporosis in her sister; Peripheral vascular disease in her daughter; Stroke (age of onset: 67) in her brother; Throat cancer in her sister. She  reports that  she has never smoked. She has never used smokeless tobacco. She reports that she drinks about 3.0 standard drinks of alcohol per week. She reports that she does not use drugs. She has a current medication list which includes the following prescription(s): alendronate, amlodipine, vitamin c, aspirin, calcium carbonate, vitamin d, furosemide, hydrocodone-acetaminophen, losartan, meclizine, multivitamin with minerals, fish oil, polyethyl glycol-propyl glycol, polyethylene glycol, pregabalin, pregabalin, risedronate, alprazolam, fluticasone, and loratadine. Current Outpatient Medications on  File Prior to Visit  Medication Sig Dispense Refill  . alendronate (FOSAMAX) 70 MG tablet   0  . amLODipine (NORVASC) 5 MG tablet TAKE 1 TABLET BY MOUTH ONCE DAILY 90 tablet 0  . Ascorbic Acid (VITAMIN C) 1000 MG tablet Take 1,000 mg by mouth daily.     [name] aspirin 81 MG tablet Take 81 mg by mouth daily.     . Calcium Carbonate (CALCIUM 600 PO) Take 1 capsule by mouth daily.     . Cholecalciferol (VITAMIN D) 2000 UNITS CAPS Take 1 capsule by mouth daily.     . furosemide (LASIX) 20 MG tablet TAKE 1 TABLET BY MOUTH ONCE DAILY, NEEDS OFFICE VISIT 90 tablet 0  . HYDROcodone-acetaminophen (NORCO/VICODIN) 5-325 MG tablet take 1 tablet by mouth twice a day if needed  0  . losartan (COZAAR) 100 MG tablet TAKE 1 TABLET BY MOUTH ONCE DAILY 90 tablet 0  . meclizine (ANTIVERT) 25 MG tablet take 1/2 to 1 tablet by mouth once daily 30 tablet 0  . Multiple Vitamin (MULTIVITAMIN WITH MINERALS) TABS Take 1 tablet by mouth daily.    . Omega-3 Fatty Acids (FISH OIL) 1000 MG CAPS Take 1 capsule by mouth daily.     [name] Glycol-Propyl Glycol (SYSTANE ULTRA OP) Apply 1 drop to eye daily as needed (for dry eyes).    . polyethylene glycol (MIRALAX / GLYCOLAX) packet Take 17 g by mouth daily. 10 each 0  . pregabalin (LYRICA) 100 MG capsule take 1 capsule by mouth at bedtime (MUST LAST 30 DAYS) 30 capsule 5  . pregabalin (LYRICA) 25 MG capsule take 1 capsule by mouth twice a day (MUST LAST 30 DAYS) 60 capsule 5  . risedronate (ACTONEL) 35 MG tablet take 1 tablet by mouth every week WITH WATER, ON AN EMPTY STOMACH...  (REFER TO PRESCRIPTION NOTES).  0   No current facility-administered medications on file prior to visit.    She is allergic to codeine; duloxetine; and sertraline hcl..  Review of Systems Review of Systems  Constitutional: Negative for activity change, appetite change and fatigue.  HENT: Negative for hearing loss, congestion, tinnitus and ear discharge.  dentist q70m Eyes: Negative for visual  disturbance (see optho q1y -- vision corrected to 20/20 with glasses).  Respiratory: Negative for cough, chest tightness and shortness of breath.   Cardiovascular: Negative for chest pain, palpitations and leg swelling.  Gastrointestinal: Negative for abdominal pain, diarrhea, constipation and abdominal distention.  Genitourinary: Negative for urgency, frequency, decreased urine volume and difficulty urinating.  Musculoskeletal: Negative for back pain, arthralgias and gait problem.  Skin: Negative for color change, pallor and rash.  Neurological: Negative for dizziness, light-headedness, numbness and headaches.  Hematological: Negative for adenopathy. Does not bruise/bleed easily.  Psychiatric/Behavioral: Negative for suicidal ideas, confusion, sleep disturbance, self-injury, dysphoric mood, decreased concentration and agitation.       Objective:    There were no vitals taken for this visit. General appearance: alert, cooperative, appears stated age and no distress Head: Normocephalic, without obvious abnormality, atraumatic Eyes:  negative findings: pupils equal, round, reactive to light and accomodation Ears: normal TM's and external ear canals both ears Nose: Nares normal. Septum midline. Mucosa normal. No drainage or sinus tenderness. Throat: lips, mucosa, and tongue normal; teeth and gums normal Neck: no adenopathy, no carotid bruit, no JVD, supple, symmetrical, trachea midline and thyroid not enlarged, symmetric, no tenderness/mass/nodules Back: symmetric, no curvature. ROM normal. No CVA tenderness. Lungs: clear to auscultation bilaterally Breasts: normal appearance, no masses or tenderness Heart: regular rate and rhythm, S1, S2 normal, no murmur, click, rub or gallop Abdomen: soft, non-tender; bowel sounds normal; no masses,  no organomegaly Pelvic: not indicated; post-menopausal, no abnormal Pap smears in past Extremities: extremities normal, atraumatic, no cyanosis or  edema Pulses: 2+ and symmetric Skin: Skin color, texture, turgor normal. No rashes or lesions    Assessment:    Healthy female exam.      Plan:    ghm utd Check labs  See After Visit Summary for Counseling Recommendations    1. Hyperlipidemia LDL goal <100 Encouraged heart healthy diet, increase exercise, avoid trans fats, consider a krill oil cap daily - Comprehensive metabolic panel - Lipid panel - CBC with Differential/Platelet - Lipid panel - CBC with Differential/Platelet - Comprehensive metabolic panel  2. Seasonal allergies  - loratadine (CLARITIN) 10 MG tablet; Take 1 tablet (10 mg total) by mouth daily.  Dispense: 30 tablet; Refill: 11  3. SOB (shortness of breath) Echo  - POCT Urinalysis Dipstick (Automated) - Lipid panel - CBC with Differential/Platelet - Comprehensive metabolic panel  4. Generalized edema Elevate legs  Compression socks  Check labs Check echo - ECHOCARDIOGRAM COMPLETE; Future - POCT Urinalysis Dipstick (Automated) - Lipid panel - CBC with Differential/Platelet - Comprehensive metabolic panel  5. Influenza vaccine administered   - Flu vaccine HIGH DOSE PF (Fluzone High Dose)  6. Anxiety disorder  Stable con't meds - Pain Mgmt, Profile 8 w/Conf, U

## 2017-12-23 ENCOUNTER — Encounter: Payer: Self-pay | Admitting: Family Medicine

## 2017-12-23 DIAGNOSIS — Z79899 Other long term (current) drug therapy: Secondary | ICD-10-CM | POA: Diagnosis not present

## 2017-12-23 LAB — LIPID PANEL
CHOLESTEROL: 216 mg/dL — AB (ref 0–200)
HDL: 66.6 mg/dL (ref >39.00)
LDL Cholesterol: 130 mg/dL — ABNORMAL HIGH (ref 0–99)
NonHDL: 149.73
TRIGLYCERIDES: 97 mg/dL (ref 0.0–149.0)
Total CHOL/HDL Ratio: 3
VLDL: 19.4 mg/dL (ref 0.0–40.0)

## 2017-12-23 LAB — COMPREHENSIVE METABOLIC PANEL
ALBUMIN: 4.1 g/dL (ref 3.5–5.2)
ALK PHOS: 55 U/L (ref 39–117)
ALT: 10 U/L (ref 0–35)
AST: 10 U/L (ref 0–37)
BILIRUBIN TOTAL: 0.6 mg/dL (ref 0.2–1.2)
BUN: 25 mg/dL — ABNORMAL HIGH (ref 6–23)
CALCIUM: 10 mg/dL (ref 8.4–10.5)
CO2: 25 mEq/L (ref 19–32)
CREATININE: 2 mg/dL — AB (ref 0.40–1.20)
Chloride: 102 mEq/L (ref 96–112)
GFR: 25.26 mL/min — ABNORMAL LOW (ref >60.00)
Glucose, Bld: 92 mg/dL (ref 70–99)
Potassium: 4.5 mEq/L (ref 3.5–5.1)
SODIUM: 139 meq/L (ref 135–145)
TOTAL PROTEIN: 7.1 g/dL (ref 6.0–8.3)

## 2017-12-23 LAB — CBC WITH DIFFERENTIAL/PLATELET
BASOS PCT: 1.3 % (ref 0.0–3.0)
Basophils Absolute: 0.1 10*3/uL (ref 0.0–0.1)
EOS PCT: 0.9 % (ref 0.0–5.0)
Eosinophils Absolute: 0.1 10*3/uL (ref 0.0–0.7)
HCT: 33.9 % — ABNORMAL LOW (ref 36.0–46.0)
Hemoglobin: 11 g/dL — ABNORMAL LOW (ref 12.0–15.0)
LYMPHS ABS: 2.2 10*3/uL (ref 0.7–4.0)
Lymphocytes Relative: 28.4 % (ref 12.0–46.0)
MCHC: 32.4 g/dL (ref 30.0–36.0)
MCV: 93 fl (ref 78.0–100.0)
MONOS PCT: 7.5 % (ref 3.0–12.0)
Monocytes Absolute: 0.6 10*3/uL (ref 0.1–1.0)
NEUTROS PCT: 61.9 % (ref 43.0–77.0)
Neutro Abs: 4.7 10*3/uL (ref 1.4–7.7)
PLATELETS: 371 10*3/uL (ref 150.0–400.0)
RBC: 3.64 Mil/uL — ABNORMAL LOW (ref 3.87–5.11)
RDW: 15.1 % (ref 11.5–15.5)
WBC: 7.6 10*3/uL (ref 4.0–10.5)

## 2017-12-24 LAB — PAIN MGMT, PROFILE 8 W/CONF, U
6 Acetylmorphine: NEGATIVE ng/mL (ref <10)
ALCOHOL METABOLITES: NEGATIVE ng/mL (ref <500)
Amphetamines: NEGATIVE ng/mL (ref <500)
BENZODIAZEPINES: NEGATIVE ng/mL (ref <100)
Buprenorphine, Urine: NEGATIVE ng/mL (ref <5)
COCAINE METABOLITE: NEGATIVE ng/mL (ref <150)
CREATININE: 29.7 mg/dL
MDMA: NEGATIVE ng/mL (ref <500)
Marijuana Metabolite: NEGATIVE ng/mL (ref <20)
OXYCODONE: NEGATIVE ng/mL (ref <100)
Opiates: NEGATIVE ng/mL (ref <100)
Oxidant: NEGATIVE ug/mL (ref <200)
PH: 6.96 (ref 4.5–9.0)

## 2017-12-27 ENCOUNTER — Other Ambulatory Visit: Payer: Self-pay

## 2017-12-27 ENCOUNTER — Ambulatory Visit (HOSPITAL_COMMUNITY): Payer: Medicare Other | Attending: Family Medicine

## 2017-12-27 DIAGNOSIS — E785 Hyperlipidemia, unspecified: Secondary | ICD-10-CM | POA: Diagnosis not present

## 2017-12-27 DIAGNOSIS — Z8249 Family history of ischemic heart disease and other diseases of the circulatory system: Secondary | ICD-10-CM | POA: Diagnosis not present

## 2017-12-27 DIAGNOSIS — I10 Essential (primary) hypertension: Secondary | ICD-10-CM | POA: Diagnosis not present

## 2017-12-27 DIAGNOSIS — R0600 Dyspnea, unspecified: Secondary | ICD-10-CM | POA: Insufficient documentation

## 2017-12-27 DIAGNOSIS — R601 Generalized edema: Secondary | ICD-10-CM

## 2017-12-27 DIAGNOSIS — I083 Combined rheumatic disorders of mitral, aortic and tricuspid valves: Secondary | ICD-10-CM | POA: Diagnosis not present

## 2017-12-28 ENCOUNTER — Ambulatory Visit: Payer: Medicare Other | Admitting: Adult Health

## 2017-12-28 NOTE — Addendum Note (Signed)
Addended byDamita Dunnings D on: 12/28/2017 01:10 PM   Modules accepted: Orders

## 2017-12-29 ENCOUNTER — Ambulatory Visit: Payer: Medicare Other | Admitting: Adult Health

## 2017-12-29 ENCOUNTER — Encounter: Payer: Self-pay | Admitting: Adult Health

## 2017-12-29 VITALS — BP 112/60 | HR 64 | Ht 65.0 in | Wt 182.0 lb

## 2017-12-29 DIAGNOSIS — G609 Hereditary and idiopathic neuropathy, unspecified: Secondary | ICD-10-CM

## 2017-12-29 NOTE — Patient Instructions (Signed)
Your Plan:  Continue Lyrica- can try eliminating morning dose to see how benificial lyrica is. Do this for one week,  If symptoms do not worsen you can eliminate lunch time dose as well.   Thank you for coming to see [place] at [facility]. I hope we have been able to provide you high quality care today.  You may receive a patient satisfaction survey over the next few weeks. We would appreciate your feedback and comments so that we may continue to improve ourselves and the health of our patients.

## 2017-12-29 NOTE — Progress Notes (Signed)
I have read the note, and I agree with the clinical assessment and plan.  Prather Failla K Trenity Pha   

## 2017-12-29 NOTE — Progress Notes (Signed)
PATIENT: Krystal Henderson DOB: Feb 09, 1933  REASON FOR VISIT: follow up HISTORY FROM: patient  HISTORY OF PRESENT ILLNESS: Today 12/29/17:  Krystal Henderson is an 84 year old female with a history of chronic back pain.  She returns today for follow-up.  She continues to take Lyrica 25 mg in the morning and at lunch and 100 mg at bedtime.  She is unsure how much Lyrica is actually helping.  She states that she has numbness in the feet and her feet always feel cold.  She denies any sharp shooting pain.  Denies any pins-and-needles sensation.  She states that she stands up sometimes and she gets dizzy.  She uses a Rollator when ambulating.  Denies any falls.  She returns today for evaluation.  HISTORY 04/27/17 Krystal Henderson is an 84 year old female with a history of chronic low back pain and peripheral neuropathy.  She returns today for follow-up.  She states that Lyrica does work well for her.  She states that her main concern is that she will wake up during the night and her feet will be ice cold.  She states because of that she is unable to fall asleep most times.  She reports that she does see pain management for chronic back pain.  She reports that they gave her hydrocodone that she typically only takes 1 tablet a day if that.  She denies any significant changes with her gait or balance.  Denies any falls.  He uses a Radiation protection practitioner when ambulating.  She returns today for an evaluation.   REVIEW OF SYSTEMS: Out of a complete 14 system review of symptoms, the patient complains only of the following symptoms, and all other reviewed systems are negative.  See HPI  ALLERGIES: Allergies  Allergen Reactions  . Codeine Nausea Only  . Duloxetine Other (See Comments)    REACTION: intolerance--She does not remember taking this Rx- states she did not feel well   . Sertraline Hcl Other (See Comments)    REACTION: intolerance-- Did not help with Depression    HOME MEDICATIONS: Outpatient Medications Prior  to Visit  Medication Sig Dispense Refill  . ALPRAZolam (XANAX) 0.25 MG tablet take 1 tablet by mouth every 8 to 12 hours if needed 30 tablet 1  . amLODipine (NORVASC) 5 MG tablet TAKE 1 TABLET BY MOUTH ONCE DAILY 90 tablet 0  . Ascorbic Acid (VITAMIN C) 1000 MG tablet Take 1,000 mg by mouth daily.     [name] aspirin 81 MG tablet Take 81 mg by mouth daily.     . Calcium Carbonate (CALCIUM 600 PO) Take 1 capsule by mouth daily.     . Cholecalciferol (VITAMIN D) 2000 UNITS CAPS Take 1 capsule by mouth daily.     . furosemide (LASIX) 20 MG tablet TAKE 1 TABLET BY MOUTH ONCE DAILY, NEEDS OFFICE VISIT 90 tablet 0  . HYDROcodone-acetaminophen (NORCO/VICODIN) 5-325 MG tablet take 1 tablet by mouth twice a day if needed  0  . losartan (COZAAR) 100 MG tablet TAKE 1 TABLET BY MOUTH ONCE DAILY 90 tablet 0  . meclizine (ANTIVERT) 25 MG tablet take 1/2 to 1 tablet by mouth once daily 30 tablet 0  . Multiple Vitamin (MULTIVITAMIN WITH MINERALS) TABS Take 1 tablet by mouth daily.    . Omega-3 Fatty Acids (FISH OIL) 1000 MG CAPS Take 1 capsule by mouth daily.     . polyethylene glycol (MIRALAX / GLYCOLAX) packet Take 17 g by mouth daily. 10 each 0  . pregabalin (LYRICA)  100 MG capsule take 1 capsule by mouth at bedtime (MUST LAST 30 DAYS) 30 capsule 5  . pregabalin (LYRICA) 25 MG capsule take 1 capsule by mouth twice a day (MUST LAST 30 DAYS) 60 capsule 5  . alendronate (FOSAMAX) 70 MG tablet   0  . fluticasone (FLONASE) 50 MCG/ACT nasal spray Place 2 sprays into both nostrils daily. (Patient not taking: Reported on 12/29/2017) 16 g 6  . loratadine (CLARITIN) 10 MG tablet Take 1 tablet (10 mg total) by mouth daily. (Patient not taking: Reported on 12/29/2017) 30 tablet 11  . Polyethyl Glycol-Propyl Glycol (SYSTANE ULTRA OP) Apply 1 drop to eye daily as needed (for dry eyes).    . risedronate (ACTONEL) 35 MG tablet take 1 tablet by mouth every week WITH WATER, ON AN EMPTY STOMACH...  (REFER TO PRESCRIPTION NOTES).  0    No facility-administered medications prior to visit.     PAST MEDICAL HISTORY: Past Medical History:  Diagnosis Date  . Anemia    hx of   . Angiodysplasia 2007   @ colonoscopy  . Anxiety    PMH of  . Chronic low back pain 06/21/2014  . Diverticulosis of colon (without mention of hemorrhage)   . DJD (degenerative joint disease)   . Esophageal reflux    inactive  . History of vertebral fracture 11/2015  . HTN (hypertension)   . Hyperlipemia 2006   LDL 130  . Hypoglycemia    reactive  . Pelvic fracture ([place]) 12/30/2011   [facility]  . Peripheral neuropathy   . Personal history of colonic polyps    adenomatous  . Vitamin B12 deficiency     PAST SURGICAL HISTORY: Past Surgical History:  Procedure Laterality Date  . cataract surgery  12-26-12 and 01-09-13  . COLONOSCOPY  2011   neg  . COLONOSCOPY W/ POLYPECTOMY     X 2 , Dr  Verl Blalock; angiodysplasia. Due 2022  . DILATION AND CURETTAGE OF UTERUS    . FACIAL COSMETIC SURGERY    . HEMORROIDECTOMY    . LUMBAR LAMINECTOMY/DECOMPRESSION MICRODISCECTOMY  09/10/2011   Procedure: LUMBAR LAMINECTOMY/DECOMPRESSION MICRODISCECTOMY;  Surgeon: Johnn Hai, MD;  Location: [facility];  Service: Orthopedics;  Laterality: N/A;  decompression laminectomy L2-3, L3-4, L4-5  . ORIF WRIST FRACTURE  01/11/2012   Procedure: OPEN REDUCTION INTERNAL FIXATION (ORIF) WRIST FRACTURE;  Surgeon: Tennis Must, MD;  Location: [place];  Service: Orthopedics;  Laterality: Right;  . TEAR DUCT PROBING     X 2  . TOTAL HIP ARTHROPLASTY  2000   right  . TUBAL LIGATION      FAMILY HISTORY: Family History  Problem Relation Age of Onset  . Heart attack Father 4  . Kidney disease Mother        renal failure  . Throat cancer Sister        smoker  . Osteoporosis Sister   . Heart attack Brother 2  . Stroke Brother 61       smoker  . Depression Maternal Uncle   . Cancer Brother        X3  lung cancer, all smokers  .  Peripheral vascular disease Daughter   . Colon cancer Neg Hx   . Diabetes Neg Hx     SOCIAL HISTORY: Social History   Socioeconomic History  . Marital status: Married    Spouse name: Not on file  . Number of children: 1  . Years of education: hs  .  Highest education level: Not on file  Occupational History  . Occupation: retired    Fish farm manager: RETIRED  Social Needs  . Financial resource strain: Not on file  . Food insecurity:    Worry: Not on file    Inability: Not on file  . Transportation needs:    Medical: Not on file    Non-medical: Not on file  Tobacco Use  . Smoking status: Never Smoker  . Smokeless tobacco: Never Used  Substance and Sexual Activity  . Alcohol use: Yes    Alcohol/week: 3.0 standard drinks    Types: 3 Glasses of wine per week    Comment: wine occasionally  . Drug use: No  . Sexual activity: Yes    Birth control/protection: Diaphragm  Lifestyle  . Physical activity:    Days per week: Not on file    Minutes per session: Not on file  . Stress: Not on file  Relationships  . Social connections:    Talks on phone: Not on file    Gets together: Not on file    Attends religious service: Not on file    Active member of club or organization: Not on file    Attends meetings of clubs or organizations: Not on file    Relationship status: Not on file  . Intimate partner violence:    Fear of current or ex partner: Not on file    Emotionally abused: Not on file    Physically abused: Not on file    Forced sexual activity: Not on file  Other Topics Concern  . Not on file  Social History Narrative   Patient is right handed.   Patient does not drink caffeine.      PHYSICAL EXAM  Vitals:   12/29/17 1431  BP: 112/60  Pulse: 64  Weight: 182 lb (82.6 kg)  Height: 5\' 5"  (1.651 m)   Body mass index is 30.29 kg/m.  Generalized: Well developed, in no acute distress   Neurological examination  Mentation: Alert oriented to time, place, history  taking. Follows all commands speech and language fluent Cranial nerve II-XII:  Extraocular movements were full, visual field were full on confrontational test. Facial sensation and strength were normal. Uvula tongue midline. Head turning and shoulder shrug  were normal and symmetric. Motor: The motor testing reveals 5 over 5 strength of all 4 extremities. Good symmetric motor tone is noted throughout.  Sensory: Sensory testing is intact to soft touch on all 4 extremities. No evidence of extinction is noted.  Coordination: Cerebellar testing reveals good finger-nose-finger and heel-to-shin bilaterally.  Gait and station: Patient requires assistance with standing.  She uses a Rollator when ambulating. Reflexes: Deep tendon reflexes are symmetric and normal bilaterally.   DIAGNOSTIC DATA (LABS, IMAGING, TESTING) - I reviewed patient records, labs, notes, testing and imaging myself where available.  Lab Results  Component Value Date   WBC 7.6 12/22/2017   HGB 11.0 (L) 12/22/2017   HCT 33.9 (L) 12/22/2017   MCV 93.0 12/22/2017   PLT 371.0 12/22/2017      Component Value Date/Time   NA 139 12/22/2017 1554   K 4.5 12/22/2017 1554   CL 102 12/22/2017 1554   CO2 25 12/22/2017 1554   GLUCOSE 92 12/22/2017 1554   BUN 25 (H) 12/22/2017 1554   CREATININE 2.00 (H) 12/22/2017 1554   CALCIUM 10.0 12/22/2017 1554   PROT 7.1 12/22/2017 1554   ALBUMIN 4.1 12/22/2017 1554   AST 10 12/22/2017 1554   ALT  10 12/22/2017 1554   ALKPHOS 55 12/22/2017 1554   BILITOT 0.6 12/22/2017 1554   GFRNONAA 28 (L) 11/04/2015 0932   GFRAA 32 (L) 11/04/2015 0932   Lab Results  Component Value Date   CHOL 216 (H) 12/22/2017   HDL 66.60 12/22/2017   LDLCALC 130 (H) 12/22/2017   LDLDIRECT 129.0 10/18/2014   TRIG 97.0 12/22/2017   CHOLHDL 3 12/22/2017   Lab Results  Component Value Date   HGBA1C 5.6 08/24/2011   Lab Results  Component Value Date   VITAMINB12 367 04/21/2015   Lab Results  Component Value  Date   TSH 3.53 11/26/2013      ASSESSMENT AND PLAN 84 y.o. year old female  has a past medical history of Anemia, Angiodysplasia (2007), Anxiety, Chronic low back pain (06/21/2014), Diverticulosis of colon (without mention of hemorrhage), DJD (degenerative joint disease), Esophageal reflux, History of vertebral fracture (11/2015), HTN (hypertension), Hyperlipemia (2006), Hypoglycemia, Pelvic fracture ([place]) (12/30/2011), Peripheral neuropathy, Personal history of colonic polyps, and Vitamin B12 deficiency. here with:  1.  Peripheral neuropathy  Overall the patient is stable.  She is unsure how much liver that is helping.  She will wean off the morning and lunchtime dose.  She will initially eliminate her morning dose and if her symptoms remain stable over the following week she can eliminate her lunchtime dose.  Of course if her symptoms worsen she can resume taking Lyrica in the morning and at lunch.  In the future we may wean her off the nighttime dose as well.  She is advised that if her symptoms worsen or she develops new symptoms she should let us know.  She will follow-up in 6 months or sooner as needed   Ward Givens, MSN, NP-C 12/29/2017, 2:58 PM [facility] [address] [place], [place] [address] [phone]

## 2018-01-02 ENCOUNTER — Other Ambulatory Visit: Payer: Self-pay | Admitting: Adult Health

## 2018-01-02 ENCOUNTER — Encounter: Payer: Self-pay | Admitting: Family Medicine

## 2018-01-02 DIAGNOSIS — G609 Hereditary and idiopathic neuropathy, unspecified: Secondary | ICD-10-CM

## 2018-01-03 ENCOUNTER — Telehealth: Payer: Self-pay

## 2018-01-03 DIAGNOSIS — G609 Hereditary and idiopathic neuropathy, unspecified: Secondary | ICD-10-CM

## 2018-01-03 MED ORDER — PREGABALIN 25 MG PO CAPS: ORAL_CAPSULE | ORAL | 0 refills | Status: DC

## 2018-01-03 MED ORDER — PREGABALIN 100 MG PO CAPS: ORAL_CAPSULE | ORAL | 5 refills | Status: DC

## 2018-01-03 MED ORDER — PREGABALIN 100 MG PO CAPS: ORAL_CAPSULE | ORAL | 0 refills | Status: DC

## 2018-01-03 MED ORDER — PREGABALIN 25 MG PO CAPS: ORAL_CAPSULE | ORAL | 5 refills | Status: DC

## 2018-01-03 NOTE — Telephone Encounter (Signed)
[facility] Database Verified Pregabalin 25 mg last refill 04/28/17 Pregabalin 100 mg last refill 08/08/17

## 2018-01-03 NOTE — Telephone Encounter (Signed)
Pt is requesting refill on Lyrica 100mg  and 25mg .  Last OV: 12/22/2017 Last Fill on both: 04/27/2017 #30 and 5RF UDS: 12/22/2017

## 2018-01-03 NOTE — Telephone Encounter (Signed)
Spoke with Ms. Vantine and she stated that she is still taking the 25 mg tablet. She said and I quote" I want to get the this refill and see if I can quit the 25 mg and keep the 100 mg".

## 2018-01-03 NOTE — Telephone Encounter (Signed)
Patient was suppose to be weaning of the 25 mg tablet (see Office note). Can you call and ask if she is weaning off.

## 2018-01-04 ENCOUNTER — Telehealth: Payer: Self-pay | Admitting: Adult Health

## 2018-01-04 NOTE — Telephone Encounter (Signed)
Spoke with Krystal Henderson, I advised her that per Jinny Blossom note from 12/29/17 that she is to eliminate the morning dose of Lyrica 25 mg to see if that helps: Take Lyrica 25 mg at lunch and 100 mg at bedtime. I advised the patient that if nothing changes with this treatment or if it actually helps to call us and let us know. Patient verbalized understanding.

## 2018-01-04 NOTE — Telephone Encounter (Signed)
Pt is asking for a call back to remind her of how she is supposed to take her  pregabalin (LYRICA) 25 MG capsule

## 2018-01-23 DIAGNOSIS — M545 Low back pain: Secondary | ICD-10-CM | POA: Diagnosis not present

## 2018-01-23 DIAGNOSIS — M961 Postlaminectomy syndrome, not elsewhere classified: Secondary | ICD-10-CM | POA: Diagnosis not present

## 2018-01-23 DIAGNOSIS — Z79891 Long term (current) use of opiate analgesic: Secondary | ICD-10-CM | POA: Diagnosis not present

## 2018-01-25 NOTE — Telephone Encounter (Signed)
Error

## 2018-02-01 ENCOUNTER — Other Ambulatory Visit: Payer: Self-pay | Admitting: Family Medicine

## 2018-02-01 DIAGNOSIS — I10 Essential (primary) hypertension: Secondary | ICD-10-CM

## 2018-02-01 DIAGNOSIS — R600 Localized edema: Secondary | ICD-10-CM

## 2018-03-06 DIAGNOSIS — I5030 Unspecified diastolic (congestive) heart failure: Secondary | ICD-10-CM | POA: Diagnosis not present

## 2018-03-06 DIAGNOSIS — N2581 Secondary hyperparathyroidism of renal origin: Secondary | ICD-10-CM | POA: Diagnosis not present

## 2018-03-06 DIAGNOSIS — N189 Chronic kidney disease, unspecified: Secondary | ICD-10-CM | POA: Diagnosis not present

## 2018-03-06 DIAGNOSIS — D631 Anemia in chronic kidney disease: Secondary | ICD-10-CM | POA: Diagnosis not present

## 2018-03-06 DIAGNOSIS — N184 Chronic kidney disease, stage 4 (severe): Secondary | ICD-10-CM | POA: Diagnosis not present

## 2018-03-06 DIAGNOSIS — E785 Hyperlipidemia, unspecified: Secondary | ICD-10-CM | POA: Diagnosis not present

## 2018-03-20 ENCOUNTER — Telehealth: Payer: Self-pay | Admitting: Adult Health

## 2018-03-20 NOTE — Telephone Encounter (Signed)
Spoke to pt.  She stated that the generic does not work like th BN and would like to go back on this.  She is taking 25mg  po AM, NOON and 100mg  po PM.  This has done nothing for her (she can't sleep,leg/feet burn, feel like ice).  She did not want to go up on this as makes her drowsy, eye site worse.  I told her would relay to [facility], NP to see if go back to [facility].  May require PA.  She had enough to get by for now.  Drug Registry checked pregabalin 25mg  last fill 01/04/18 #180 (90 day) [facility].  Last fill pregabalin 100mg  02/26/18 #30 [facility].  Do you want to change to Brand Name? Since other is not effective, pt states BN worked better.  I made print out of drug registry.

## 2018-03-20 NOTE — Telephone Encounter (Signed)
Ok to go back to brand name.

## 2018-03-20 NOTE — Telephone Encounter (Signed)
Pt requesting a call stating she would like to discuss changing medication to name brand(genierc did not help) pregabalin (LYRICA) 100 MG capsule and pregabalin (LYRICA) 25 MG capsule please advise

## 2018-03-21 NOTE — Addendum Note (Signed)
Addended by: Oliver Hum S on: 03/21/2018 12:09 PM   Modules accepted: Orders

## 2018-03-21 NOTE — Telephone Encounter (Signed)
I spoke to pt and asked her if her pcp was going to keep filling her lyrica, as she did 6 month fill from 01/03/2018.  She said she did not know she was going to do this.  I stated that mychart email was sent to her as well to do this.  Pt stated that she thought her husband may have done this as she did not know how to do this on computer.  She wanted [place] to handle the lyrica from now on.  I told her will send in [facility] Lyrica since more effective then generic.  She verbalized understanding.

## 2018-03-21 NOTE — Addendum Note (Signed)
Addended by: Oliver Hum S on: 03/21/2018 12:07 PM   Modules accepted: Orders

## 2018-03-23 MED ORDER — LYRICA 100 MG PO CAPS: 100.0000 mg | ORAL_CAPSULE | Freq: Every day | ORAL | 5 refills | Status: DC

## 2018-03-23 MED ORDER — LYRICA 25 MG PO CAPS: ORAL_CAPSULE | ORAL | 5 refills | Status: DC

## 2018-03-23 NOTE — Addendum Note (Signed)
Addended by: Trudie Buckler on: 03/23/2018 05:03 PM   Modules accepted: Orders

## 2018-03-30 ENCOUNTER — Other Ambulatory Visit (HOSPITAL_COMMUNITY): Payer: Self-pay | Admitting: *Deleted

## 2018-03-30 NOTE — Telephone Encounter (Signed)
PA approval for Lyrica (BN) both 100mg  30/30 and 25mg  60/30 PA [id number].  This is good thru 04/05/19.  Tried gabapentin and pregabalin (not effective) for neuropathic pain (G60.9).  Fax confirmation received [facility] [phone]. Pt made aware.

## 2018-03-31 ENCOUNTER — Ambulatory Visit (HOSPITAL_COMMUNITY)
Admission: RE | Admit: 2018-03-31 | Discharge: 2018-03-31 | Disposition: A | Discharge status: Home / Self Care | Payer: Medicare Other | Source: Ambulatory Visit | Attending: Nephrology | Admitting: Nephrology

## 2018-03-31 DIAGNOSIS — N184 Chronic kidney disease, stage 4 (severe): Secondary | ICD-10-CM | POA: Insufficient documentation

## 2018-03-31 DIAGNOSIS — D631 Anemia in chronic kidney disease: Secondary | ICD-10-CM | POA: Insufficient documentation

## 2018-03-31 LAB — IRON AND TIBC
Iron: 27 ug/dL — ABNORMAL LOW (ref 28–170)
Saturation Ratios: 6 % — ABNORMAL LOW (ref 10.4–31.8)
TIBC: 449 ug/dL (ref 250–450)
UIBC: 422 ug/dL

## 2018-03-31 LAB — FERRITIN: Ferritin: 14 ng/mL (ref 11–307)

## 2018-03-31 LAB — POCT HEMOGLOBIN-HEMACUE: HEMOGLOBIN: 9.6 g/dL — AB (ref 12.0–15.0)

## 2018-03-31 MED ORDER — EPOETIN ALFA-EPBX 2000 UNIT/ML IJ SOLN
2000.0000 [IU] | Freq: Once | INTRAMUSCULAR | Status: AC
  Administered 2018-03-31: 2000 [IU] via SUBCUTANEOUS
  Filled 2018-03-31: qty 1

## 2018-03-31 MED ORDER — EPOETIN ALFA-EPBX 10000 UNIT/ML IJ SOLN: 5000.0000 [IU] | Freq: Once | INTRAMUSCULAR | Status: DC

## 2018-03-31 MED ORDER — EPOETIN ALFA-EPBX 3000 UNIT/ML IJ SOLN
3000.0000 [IU] | Freq: Once | INTRAMUSCULAR | Status: AC
  Administered 2018-03-31: 3000 [IU] via SUBCUTANEOUS
  Filled 2018-03-31: qty 1

## 2018-03-31 NOTE — Discharge Instructions (Signed)
Epoetin Alfa injection °What is this medicine? °EPOETIN ALFA (e POE e tin AL fa) helps your body make more red blood cells. This medicine is used to treat anemia caused by chronic kidney disease, cancer chemotherapy, or HIV-therapy. It may also be used before surgery if you have anemia. °This medicine may be used for other purposes; ask your health care provider or pharmacist if you have questions. °COMMON BRAND NAME(S): Epogen, Procrit, Retacrit °What should I tell my health care provider before I take this medicine? °They need to know if you have any of these conditions: °-cancer °-heart disease °-high blood pressure °-history of blood clots °-history of stroke °-low levels of folate, iron, or vitamin B12 in the blood °-seizures °-an unusual or allergic reaction to erythropoietin, albumin, benzyl alcohol, hamster proteins, other medicines, foods, dyes, or preservatives °-pregnant or trying to get pregnant °-breast-feeding °How should I use this medicine? °This medicine is for injection into a vein or under the skin. It is usually given by a health care professional in a hospital or clinic setting. °If you get this medicine at home, you will be taught how to prepare and give this medicine. Use exactly as directed. Take your medicine at regular intervals. Do not take your medicine more often than directed. °It is important that you put your used needles and syringes in a special sharps container. Do not put them in a trash can. If you do not have a sharps container, call your pharmacist or healthcare provider to get one. °A special MedGuide will be given to you by the pharmacist with each prescription and refill. Be sure to read this information carefully each time. °Talk to your pediatrician regarding the use of this medicine in children. While this drug may be prescribed for selected conditions, precautions do apply. °Overdosage: If you think you have taken too much of this medicine contact a poison control center  or emergency room at once. °NOTE: This medicine is only for you. Do not share this medicine with others. °What if I miss a dose? °If you miss a dose, take it as soon as you can. If it is almost time for your next dose, take only that dose. Do not take double or extra doses. °What may interact with this medicine? °Interactions have not been studied. °This list may not describe all possible interactions. Give your health care provider a list of all the medicines, herbs, non-prescription drugs, or dietary supplements you use. Also tell them if you smoke, drink alcohol, or use illegal drugs. Some items may interact with your medicine. °What should I watch for while using this medicine? °Your condition will be monitored carefully while you are receiving this medicine. °You may need blood work done while you are taking this medicine. °This medicine may cause a decrease in vitamin B6. You should make sure that you get enough vitamin B6 while you are taking this medicine. Discuss the foods you eat and the vitamins you take with your health care professional. °What side effects may I notice from receiving this medicine? °Side effects that you should report to your doctor or health care professional as soon as possible: °-allergic reactions like skin rash, itching or hives, swelling of the face, lips, or tongue °-seizures °-signs and symptoms of a blood clot such as breathing problems; changes in vision; chest pain; severe, sudden headache; pain, swelling, warmth in the leg; trouble speaking; sudden numbness or weakness of the face, arm or leg °-signs and symptoms of a stroke   like changes in vision; confusion; trouble speaking or understanding; severe headaches; sudden numbness or weakness of the face, arm or leg; trouble walking; dizziness; loss of balance or coordination °Side effects that usually do not require medical attention (report to your doctor or health care professional if they continue or are  bothersome): °-chills °-cough °-dizziness °-fever °-headaches °-joint pain °-muscle cramps °-muscle pain °-nausea, vomiting °-pain, redness, or irritation at site where injected °This list may not describe all possible side effects. Call your doctor for medical advice about side effects. You may report side effects to FDA at 1-800-FDA-1088. °Where should I keep my medicine? °Keep out of the reach of children. °Store in a refrigerator between 2 and 8 degrees C (36 and 46 degrees F). Do not freeze or shake. Throw away any unused portion if using a single-dose vial. Multi-dose vials can be kept in the refrigerator for up to 21 days after the initial dose. Throw away unused medicine. °NOTE: This sheet is a summary. It may not cover all possible information. If you have questions about this medicine, talk to your doctor, pharmacist, or health care provider. °© 2019 Elsevier/Gold Standard (2016-10-29 08:35:19) ° °

## 2018-04-02 ENCOUNTER — Encounter: Payer: Self-pay | Admitting: Nephrology

## 2018-04-02 DIAGNOSIS — D509 Iron deficiency anemia, unspecified: Secondary | ICD-10-CM | POA: Insufficient documentation

## 2018-04-06 ENCOUNTER — Other Ambulatory Visit (HOSPITAL_COMMUNITY): Payer: Self-pay | Admitting: *Deleted

## 2018-04-06 NOTE — Discharge Instructions (Signed)

## 2018-04-07 ENCOUNTER — Ambulatory Visit (HOSPITAL_COMMUNITY)
Admission: RE | Admit: 2018-04-07 | Discharge: 2018-04-07 | Disposition: A | Discharge status: Home / Self Care | Payer: Medicare Other | Source: Ambulatory Visit | Attending: Nephrology | Admitting: Nephrology

## 2018-04-07 VITALS — BP 119/66 | HR 60 | Temp 98.6°F | Resp 20 | Ht 65.0 in | Wt 182.0 lb

## 2018-04-07 DIAGNOSIS — D508 Other iron deficiency anemias: Secondary | ICD-10-CM | POA: Insufficient documentation

## 2018-04-07 LAB — POCT HEMOGLOBIN-HEMACUE: Hemoglobin: 9.6 g/dL — ABNORMAL LOW (ref 12.0–15.0)

## 2018-04-07 MED ORDER — EPOETIN ALFA-EPBX 10000 UNIT/ML IJ SOLN: 5000.0000 [IU] | INTRAMUSCULAR | Status: DC

## 2018-04-07 MED ORDER — SODIUM CHLORIDE 0.9 % IV SOLN
510.0000 mg | INTRAVENOUS | Status: DC
  Administered 2018-04-07: 510 mg via INTRAVENOUS
  Filled 2018-04-07: qty 17

## 2018-04-07 MED ORDER — EPOETIN ALFA-EPBX 2000 UNIT/ML IJ SOLN
2000.0000 [IU] | Freq: Once | INTRAMUSCULAR | Status: AC
  Administered 2018-04-07: 2000 [IU] via SUBCUTANEOUS
  Filled 2018-04-07: qty 1

## 2018-04-07 MED ORDER — EPOETIN ALFA-EPBX 3000 UNIT/ML IJ SOLN
3000.0000 [IU] | Freq: Once | INTRAMUSCULAR | Status: AC
  Administered 2018-04-07: 3000 [IU] via SUBCUTANEOUS
  Filled 2018-04-07: qty 1

## 2018-04-14 ENCOUNTER — Ambulatory Visit (HOSPITAL_COMMUNITY)
Admission: RE | Admit: 2018-04-14 | Discharge: 2018-04-14 | Disposition: A | Discharge status: Home / Self Care | Payer: Medicare Other | Source: Ambulatory Visit | Attending: Nephrology | Admitting: Nephrology

## 2018-04-14 VITALS — BP 137/52 | HR 67 | Temp 98.6°F | Resp 20

## 2018-04-14 DIAGNOSIS — D508 Other iron deficiency anemias: Secondary | ICD-10-CM | POA: Diagnosis not present

## 2018-04-14 LAB — POCT HEMOGLOBIN-HEMACUE: Hemoglobin: 10.1 g/dL — ABNORMAL LOW (ref 12.0–15.0)

## 2018-04-14 MED ORDER — EPOETIN ALFA-EPBX 3000 UNIT/ML IJ SOLN
3000.0000 [IU] | Freq: Once | INTRAMUSCULAR | Status: AC
  Administered 2018-04-14: 3000 [IU] via SUBCUTANEOUS
  Filled 2018-04-14: qty 1

## 2018-04-14 MED ORDER — EPOETIN ALFA-EPBX 10000 UNIT/ML IJ SOLN: 5000.0000 [IU] | INTRAMUSCULAR | Status: DC

## 2018-04-14 MED ORDER — SODIUM CHLORIDE 0.9 % IV SOLN
510.0000 mg | INTRAVENOUS | Status: AC
  Administered 2018-04-14: 510 mg via INTRAVENOUS
  Filled 2018-04-14: qty 510

## 2018-04-14 MED ORDER — EPOETIN ALFA-EPBX 2000 UNIT/ML IJ SOLN
2000.0000 [IU] | Freq: Once | INTRAMUSCULAR | Status: AC
  Administered 2018-04-14: 2000 [IU] via SUBCUTANEOUS
  Filled 2018-04-14: qty 1

## 2018-04-21 ENCOUNTER — Encounter (HOSPITAL_COMMUNITY)
Admission: RE | Admit: 2018-04-21 | Discharge: 2018-04-21 | Disposition: A | Discharge status: Home / Self Care | Payer: Medicare Other | Source: Ambulatory Visit | Attending: Nephrology | Admitting: Nephrology

## 2018-04-21 VITALS — BP 125/60 | HR 66

## 2018-04-21 DIAGNOSIS — D508 Other iron deficiency anemias: Secondary | ICD-10-CM

## 2018-04-21 DIAGNOSIS — D631 Anemia in chronic kidney disease: Secondary | ICD-10-CM | POA: Diagnosis not present

## 2018-04-21 DIAGNOSIS — N184 Chronic kidney disease, stage 4 (severe): Secondary | ICD-10-CM | POA: Insufficient documentation

## 2018-04-21 LAB — IRON AND TIBC
Iron: 97 ug/dL (ref 28–170)
Saturation Ratios: 28 % (ref 10.4–31.8)
TIBC: 349 ug/dL (ref 250–450)
UIBC: 252 ug/dL

## 2018-04-21 LAB — POCT HEMOGLOBIN-HEMACUE: Hemoglobin: 11.5 g/dL — ABNORMAL LOW (ref 12.0–15.0)

## 2018-04-21 LAB — FERRITIN: FERRITIN: 455 ng/mL — AB (ref 11–307)

## 2018-04-21 MED ORDER — EPOETIN ALFA-EPBX 10000 UNIT/ML IJ SOLN: 5000.0000 [IU] | INTRAMUSCULAR | Status: DC

## 2018-04-21 MED ORDER — EPOETIN ALFA-EPBX 3000 UNIT/ML IJ SOLN
3000.0000 [IU] | INTRAMUSCULAR | Status: DC
  Administered 2018-04-21: 3000 [IU] via SUBCUTANEOUS
  Filled 2018-04-21: qty 1

## 2018-04-21 MED ORDER — EPOETIN ALFA-EPBX 2000 UNIT/ML IJ SOLN
2000.0000 [IU] | INTRAMUSCULAR | Status: DC
  Administered 2018-04-21: 2000 [IU] via SUBCUTANEOUS
  Filled 2018-04-21: qty 1

## 2018-04-28 ENCOUNTER — Ambulatory Visit (HOSPITAL_COMMUNITY)
Admission: RE | Admit: 2018-04-28 | Discharge: 2018-04-28 | Disposition: A | Discharge status: Home / Self Care | Payer: Medicare Other | Source: Ambulatory Visit | Attending: Nephrology | Admitting: Nephrology

## 2018-04-28 VITALS — BP 133/68 | HR 62 | Temp 98.0°F | Resp 20

## 2018-04-28 DIAGNOSIS — D508 Other iron deficiency anemias: Secondary | ICD-10-CM | POA: Insufficient documentation

## 2018-04-28 LAB — POCT HEMOGLOBIN-HEMACUE: Hemoglobin: 12.2 g/dL (ref 12.0–15.0)

## 2018-04-28 MED ORDER — EPOETIN ALFA-EPBX 10000 UNIT/ML IJ SOLN: 5000.0000 [IU] | INTRAMUSCULAR | Status: DC

## 2018-04-29 ENCOUNTER — Other Ambulatory Visit: Payer: Self-pay | Admitting: Family Medicine

## 2018-04-29 DIAGNOSIS — I10 Essential (primary) hypertension: Secondary | ICD-10-CM

## 2018-04-29 DIAGNOSIS — R600 Localized edema: Secondary | ICD-10-CM

## 2018-05-05 ENCOUNTER — Encounter (HOSPITAL_COMMUNITY): Payer: Self-pay

## 2018-05-12 ENCOUNTER — Encounter (HOSPITAL_COMMUNITY)
Admission: RE | Admit: 2018-05-12 | Discharge: 2018-05-12 | Disposition: A | Discharge status: Home / Self Care | Payer: Medicare Other | Source: Ambulatory Visit | Attending: Nephrology | Admitting: Nephrology

## 2018-05-12 VITALS — BP 136/78 | HR 61 | Temp 98.0°F | Resp 20

## 2018-05-12 DIAGNOSIS — D508 Other iron deficiency anemias: Secondary | ICD-10-CM | POA: Diagnosis not present

## 2018-05-12 LAB — POCT HEMOGLOBIN-HEMACUE: Hemoglobin: 12.6 g/dL (ref 12.0–15.0)

## 2018-05-12 MED ORDER — EPOETIN ALFA-EPBX 2000 UNIT/ML IJ SOLN
2000.0000 [IU] | Freq: Once | INTRAMUSCULAR | Status: DC
  Filled 2018-05-12: qty 1

## 2018-05-12 MED ORDER — EPOETIN ALFA-EPBX 10000 UNIT/ML IJ SOLN: 5000.0000 [IU] | INTRAMUSCULAR | Status: DC

## 2018-05-12 MED ORDER — EPOETIN ALFA-EPBX 3000 UNIT/ML IJ SOLN
3000.0000 [IU] | Freq: Once | INTRAMUSCULAR | Status: DC
  Filled 2018-05-12: qty 1

## 2018-05-19 ENCOUNTER — Encounter (HOSPITAL_COMMUNITY): Payer: Self-pay

## 2018-05-26 ENCOUNTER — Ambulatory Visit (HOSPITAL_COMMUNITY)
Admission: RE | Admit: 2018-05-26 | Discharge: 2018-05-26 | Disposition: A | Discharge status: Home / Self Care | Payer: Medicare Other | Source: Ambulatory Visit | Attending: Nephrology | Admitting: Nephrology

## 2018-05-26 VITALS — BP 129/62 | HR 63 | Temp 97.9°F | Resp 20

## 2018-05-26 DIAGNOSIS — D508 Other iron deficiency anemias: Secondary | ICD-10-CM | POA: Diagnosis not present

## 2018-05-26 LAB — IRON AND TIBC
Iron: 92 ug/dL (ref 28–170)
Saturation Ratios: 37 % — ABNORMAL HIGH (ref 10.4–31.8)
TIBC: 248 ug/dL — ABNORMAL LOW (ref 250–450)
UIBC: 156 ug/dL

## 2018-05-26 LAB — POCT HEMOGLOBIN-HEMACUE: HEMOGLOBIN: 12.8 g/dL (ref 12.0–15.0)

## 2018-05-26 LAB — FERRITIN: Ferritin: 125 ng/mL (ref 11–307)

## 2018-05-26 MED ORDER — EPOETIN ALFA-EPBX 10000 UNIT/ML IJ SOLN: 5000.0000 [IU] | INTRAMUSCULAR | Status: DC

## 2018-05-26 MED ORDER — EPOETIN ALFA-EPBX 3000 UNIT/ML IJ SOLN
3000.0000 [IU] | Freq: Once | INTRAMUSCULAR | Status: DC
  Filled 2018-05-26: qty 1

## 2018-05-26 MED ORDER — EPOETIN ALFA-EPBX 2000 UNIT/ML IJ SOLN
2000.0000 [IU] | Freq: Once | INTRAMUSCULAR | Status: DC
  Filled 2018-05-26: qty 1

## 2018-06-02 ENCOUNTER — Encounter (HOSPITAL_COMMUNITY): Payer: Self-pay

## 2018-06-09 ENCOUNTER — Ambulatory Visit (HOSPITAL_COMMUNITY)
Admission: RE | Admit: 2018-06-09 | Discharge: 2018-06-09 | Disposition: A | Discharge status: Home / Self Care | Payer: Medicare Other | Source: Ambulatory Visit | Attending: Nephrology | Admitting: Nephrology

## 2018-06-09 VITALS — BP 132/56 | HR 62 | Temp 98.0°F | Resp 20

## 2018-06-09 DIAGNOSIS — D508 Other iron deficiency anemias: Secondary | ICD-10-CM | POA: Insufficient documentation

## 2018-06-09 LAB — POCT HEMOGLOBIN-HEMACUE: Hemoglobin: 12.6 g/dL (ref 12.0–15.0)

## 2018-06-09 MED ORDER — EPOETIN ALFA-EPBX 3000 UNIT/ML IJ SOLN
3000.0000 [IU] | Freq: Once | INTRAMUSCULAR | Status: DC
  Filled 2018-06-09: qty 1

## 2018-06-09 MED ORDER — EPOETIN ALFA-EPBX 10000 UNIT/ML IJ SOLN: 5000.0000 [IU] | INTRAMUSCULAR | Status: DC

## 2018-06-09 MED ORDER — EPOETIN ALFA-EPBX 2000 UNIT/ML IJ SOLN
2000.0000 [IU] | Freq: Once | INTRAMUSCULAR | Status: DC
  Filled 2018-06-09: qty 1

## 2018-06-16 ENCOUNTER — Inpatient Hospital Stay (HOSPITAL_COMMUNITY): Admission: RE | Admit: 2018-06-16 | Payer: Self-pay | Source: Ambulatory Visit

## 2018-06-22 ENCOUNTER — Ambulatory Visit (INDEPENDENT_AMBULATORY_CARE_PROVIDER_SITE_OTHER): Payer: Medicare Other | Admitting: Family Medicine

## 2018-06-22 ENCOUNTER — Encounter: Payer: Self-pay | Admitting: Family Medicine

## 2018-06-22 ENCOUNTER — Other Ambulatory Visit: Payer: Self-pay

## 2018-06-22 VITALS — BP 110/66 | HR 62 | Temp 97.7°F | Resp 12 | Ht 65.0 in | Wt 180.4 lb

## 2018-06-22 DIAGNOSIS — I10 Essential (primary) hypertension: Secondary | ICD-10-CM

## 2018-06-22 DIAGNOSIS — E782 Mixed hyperlipidemia: Secondary | ICD-10-CM

## 2018-06-22 DIAGNOSIS — D508 Other iron deficiency anemias: Secondary | ICD-10-CM

## 2018-06-22 DIAGNOSIS — R609 Edema, unspecified: Secondary | ICD-10-CM | POA: Insufficient documentation

## 2018-06-22 NOTE — Assessment & Plan Note (Signed)
[facility] nephrology Check labs

## 2018-06-22 NOTE — Progress Notes (Signed)
Patient ID: Krystal Henderson, female    DOB: 24-Jan-1933  Age: 85 y.o. MRN: [id number]    Subjective:  Subjective  HPI Krystal Henderson presents for f/u bp, chol and c/o some swelling in low ext  No calf pain, no cp, no sob.    Review of Systems  Constitutional: Negative for appetite change, diaphoresis, fatigue and unexpected weight change.  Eyes: Negative for pain, redness and visual disturbance.  Respiratory: Negative for cough, chest tightness, shortness of breath and wheezing.   Cardiovascular: Positive for leg swelling. Negative for chest pain and palpitations.  Endocrine: Negative for cold intolerance, heat intolerance, polydipsia, polyphagia and polyuria.  Genitourinary: Negative for difficulty urinating, dysuria and frequency.  Neurological: Negative for dizziness, light-headedness, numbness and headaches.    History Past Medical History:  Diagnosis Date  . Anemia    hx of   . Angiodysplasia 2007   @ colonoscopy  . Anxiety    PMH of  . Chronic low back pain 06/21/2014  . Diverticulosis of colon (without mention of hemorrhage)   . DJD (degenerative joint disease)   . Esophageal reflux    inactive  . History of vertebral fracture 11/2015  . HTN (hypertension)   . Hyperlipemia 2006   LDL 130  . Hypoglycemia    reactive  . Pelvic fracture ([place]) 12/30/2011   [facility]  . Peripheral neuropathy   . Personal history of colonic polyps    adenomatous  . Vitamin B12 deficiency     She has a past surgical history that includes Tear duct probing; Total hip arthroplasty (2000); Facial cosmetic surgery; Tubal ligation; Hemorroidectomy; Dilation and curettage of uterus; Colonoscopy w/ polypectomy; Lumbar laminectomy/decompression microdiscectomy (09/10/2011); ORIF wrist fracture (01/11/2012); cataract surgery (12-26-12 and 01-09-13); and Colonoscopy (2011).   Her family history includes Cancer in her brother; Depression in her maternal uncle; Heart attack (age of onset: 69) in  her brother; Heart attack (age of onset: 53) in her father; Kidney disease in her mother; Osteoporosis in her sister; Peripheral vascular disease in her daughter; Stroke (age of onset: 68) in her brother; Throat cancer in her sister.She reports that she has never smoked. She has never used smokeless tobacco. She reports current alcohol use of about 3.0 standard drinks of alcohol per week. She reports that she does not use drugs.  Current Outpatient Medications on File Prior to Visit  Medication Sig Dispense Refill  . ALPRAZolam (XANAX) 0.25 MG tablet take 1 tablet by mouth every 8 to 12 hours if needed 30 tablet 1  . amLODipine (NORVASC) 5 MG tablet TAKE 1 TABLET BY MOUTH ONCE DAILY 90 tablet 1  . Ascorbic Acid (VITAMIN C) 1000 MG tablet Take 1,000 mg by mouth daily.     [name] aspirin 81 MG tablet Take 81 mg by mouth daily.     . Calcium Carbonate (CALCIUM 600 PO) Take 1 capsule by mouth daily.     . Cholecalciferol (VITAMIN D) 2000 UNITS CAPS Take 1 capsule by mouth daily.     . fluticasone (FLONASE) 50 MCG/ACT nasal spray Place 2 sprays into both nostrils daily. 16 g 6  . furosemide (LASIX) 20 MG tablet Take 1 tablet (20 mg total) by mouth daily. 90 tablet 0  . HYDROcodone-acetaminophen (NORCO/VICODIN) 5-325 MG tablet take 1 tablet by mouth twice a day if needed  0  . loratadine (CLARITIN) 10 MG tablet Take 1 tablet (10 mg total) by mouth daily. 30 tablet 11  . losartan (COZAAR) 100 MG  tablet TAKE 1 TABLET BY MOUTH ONCE DAILY 90 tablet 1  . LYRICA 100 MG capsule Take 1 capsule (100 mg total) by mouth at bedtime. 30 capsule 5  . LYRICA 25 MG capsule Take one capsule in AM and at NOON 60 capsule 5  . meclizine (ANTIVERT) 25 MG tablet take 1/2 to 1 tablet by mouth once daily 30 tablet 0  . Multiple Vitamin (MULTIVITAMIN WITH MINERALS) TABS Take 1 tablet by mouth daily.    . Omega-3 Fatty Acids (FISH OIL) 1000 MG CAPS Take 1 capsule by mouth daily.     [name] Glycol-Propyl Glycol (SYSTANE ULTRA  OP) Apply 1 drop to eye daily as needed (for dry eyes).    . polyethylene glycol (MIRALAX / GLYCOLAX) packet Take 17 g by mouth daily. 10 each 0   No current facility-administered medications on file prior to visit.      Objective:  Objective  Physical Exam Vitals signs and nursing note reviewed.  Constitutional:      Appearance: She is well-developed.  HENT:     Head: Normocephalic and atraumatic.  Eyes:     Conjunctiva/sclera: Conjunctivae normal.  Neck:     Musculoskeletal: Normal range of motion and neck supple.     Thyroid: No thyromegaly.     Vascular: No carotid bruit or JVD.  Cardiovascular:     Rate and Rhythm: Normal rate and regular rhythm.     Heart sounds: Normal heart sounds. No murmur.  Pulmonary:     Effort: Pulmonary effort is normal. No respiratory distress.     Breath sounds: Normal breath sounds. No wheezing or rales.  Chest:     Chest wall: No tenderness.  Musculoskeletal:        General: Swelling present.  Neurological:     Mental Status: She is alert and oriented to person, place, and time.    BP 110/66 (BP Location: Right Arm, Cuff Size: Normal)   Pulse 62   Temp 97.7 F (36.5 C) (Oral)   Resp 12   Ht 5\' 5"  (1.651 m)   Wt 180 lb 6.4 oz (81.8 kg)   SpO2 96%   BMI 30.02 kg/m  Wt Readings from Last 3 Encounters:  06/22/18 180 lb 6.4 oz (81.8 kg)  04/07/18 182 lb (82.6 kg)  12/29/17 182 lb (82.6 kg)     Lab Results  Component Value Date   WBC 7.6 12/22/2017   HGB 12.6 06/09/2018   HCT 33.9 (L) 12/22/2017   PLT 371.0 12/22/2017   GLUCOSE 92 12/22/2017   CHOL 216 (H) 12/22/2017   TRIG 97.0 12/22/2017   HDL 66.60 12/22/2017   LDLDIRECT 129.0 10/18/2014   LDLCALC 130 (H) 12/22/2017   ALT 10 12/22/2017   AST 10 12/22/2017   NA 139 12/22/2017   K 4.5 12/22/2017   CL 102 12/22/2017   CREATININE 2.00 (H) 12/22/2017   BUN 25 (H) 12/22/2017   CO2 25 12/22/2017   TSH 3.53 11/26/2013   INR 1.3 ratio (H) 10/29/2009   HGBA1C 5.6  08/24/2011    No results found.   Assessment & Plan:  Plan  I have discontinued Krystal Henderson. Krystal Henderson's alendronate and risedronate. I am also having her maintain her aspirin, Calcium Carbonate (CALCIUM 600 PO), Fish Oil, vitamin C, Vitamin D, multivitamin with minerals, polyethylene glycol, meclizine, Polyethyl Glycol-Propyl Glycol (SYSTANE ULTRA OP), HYDROcodone-acetaminophen, loratadine, fluticasone, ALPRAZolam, Lyrica, Lyrica, furosemide, amLODipine, and losartan.  No orders of the defined types were placed in this encounter.  Problem List Items Addressed This Visit      Unprioritized   Edema    Elevate legs Increase lasix 2 a day for 3-4 days       Essential hypertension    Well controlled, no changes to meds. Encouraged heart healthy diet such as the DASH diet and exercise as tolerated.       Relevant Orders   Lipid panel   CBC with Differential/Platelet   Comprehensive metabolic panel   IBC + Ferritin   HYPERLIPIDEMIA    Encouraged heart healthy diet, increase exercise, avoid trans fats, consider a krill oil cap daily      Iron deficiency anemia - Primary    [facility] nephrology Check labs      Relevant Orders   Lipid panel   CBC with Differential/Platelet   Comprehensive metabolic panel   IBC + Ferritin      Follow-up: Return in about 6 months (around 12/23/2018), or if symptoms worsen or fail to improve, for annual exam.  Ann Held, DO

## 2018-06-22 NOTE — Patient Instructions (Addendum)
Please take 2 lasix in the am for the next 3-4 days and let us know if this helps your swelling     DASH Eating Plan DASH stands for "Dietary Approaches to Stop Hypertension." The DASH eating plan is a healthy eating plan that has been shown to reduce high blood pressure (hypertension). It may also reduce your risk for type 2 diabetes, heart disease, and stroke. The DASH eating plan may also help with weight loss. What are tips for following this plan?  General guidelines  Avoid eating more than 2,300 mg (milligrams) of salt (sodium) a day. If you have hypertension, you may need to reduce your sodium intake to 1,500 mg a day.  Limit alcohol intake to no more than 1 drink a day for nonpregnant women and 2 drinks a day for men. One drink equals 12 oz of beer, 5 oz of wine, or 1 oz of hard liquor.  Work with your health care provider to maintain a healthy body weight or to lose weight. Ask what an ideal weight is for you.  Get at least 30 minutes of exercise that causes your heart to beat faster (aerobic exercise) most days of the week. Activities may include walking, swimming, or biking.  Work with your health care provider or diet and nutrition specialist (dietitian) to adjust your eating plan to your individual calorie needs. Reading food labels   Check food labels for the amount of sodium per serving. Choose foods with less than 5 percent of the Daily Value of sodium. Generally, foods with less than 300 mg of sodium per serving fit into this eating plan.  To find whole grains, look for the word "whole" as the first word in the ingredient list. Shopping  Buy products labeled as "low-sodium" or "no salt added."  Buy fresh foods. Avoid canned foods and premade or frozen meals. Cooking  Avoid adding salt when cooking. Use salt-free seasonings or herbs instead of table salt or sea salt. Check with your health care provider or pharmacist before using salt substitutes.  Do not fry  foods. Cook foods using healthy methods such as baking, boiling, grilling, and broiling instead.  Cook with heart-healthy oils, such as olive, canola, soybean, or sunflower oil. Meal planning  Eat a balanced diet that includes: ? 5 or more servings of fruits and vegetables each day. At each meal, try to fill half of your plate with fruits and vegetables. ? Up to 6-8 servings of whole grains each day. ? Less than 6 oz of lean meat, poultry, or fish each day. A 3-oz serving of meat is about the same size as a deck of cards. One egg equals 1 oz. ? 2 servings of low-fat dairy each day. ? A serving of nuts, seeds, or beans 5 times each week. ? Heart-healthy fats. Healthy fats called Omega-3 fatty acids are found in foods such as flaxseeds and coldwater fish, like sardines, salmon, and mackerel.  Limit how much you eat of the following: ? Canned or prepackaged foods. ? Food that is high in trans fat, such as fried foods. ? Food that is high in saturated fat, such as fatty meat. ? Sweets, desserts, sugary drinks, and other foods with added sugar. ? Full-fat dairy products.  Do not salt foods before eating.  Try to eat at least 2 vegetarian meals each week.  Eat more home-cooked food and less restaurant, buffet, and fast food.  When eating at a restaurant, ask that your food be prepared with  less salt or no salt, if possible. What foods are recommended? The items listed may not be a complete list. Talk with your dietitian about what dietary choices are best for you. Grains Whole-grain or whole-wheat bread. Whole-grain or whole-wheat pasta. Brown rice. [place] Morrow. Bulgur. Whole-grain and low-sodium cereals. Pita bread. Low-fat, low-sodium crackers. Whole-wheat flour tortillas. Vegetables Fresh or frozen vegetables (raw, steamed, roasted, or grilled). Low-sodium or reduced-sodium tomato and vegetable juice. Low-sodium or reduced-sodium tomato sauce and tomato paste. Low-sodium or  reduced-sodium canned vegetables. Fruits All fresh, dried, or frozen fruit. Canned fruit in natural juice (without added sugar). Meat and other protein foods Skinless chicken or [place]. Ground chicken or [place]. Pork with fat trimmed off. Fish and seafood. Egg whites. Dried beans, peas, or lentils. Unsalted nuts, nut butters, and seeds. Unsalted canned beans. Lean cuts of beef with fat trimmed off. Low-sodium, lean deli meat. Dairy Low-fat (1%) or fat-free (skim) milk. Fat-free, low-fat, or reduced-fat cheeses. Nonfat, low-sodium ricotta or cottage cheese. Low-fat or nonfat yogurt. Low-fat, low-sodium cheese. Fats and oils Soft margarine without trans fats. Vegetable oil. Low-fat, reduced-fat, or light mayonnaise and salad dressings (reduced-sodium). Canola, safflower, olive, soybean, and sunflower oils. Avocado. Seasoning and other foods Herbs. Spices. Seasoning mixes without salt. Unsalted popcorn and pretzels. Fat-free sweets. What foods are not recommended? The items listed may not be a complete list. Talk with your dietitian about what dietary choices are best for you. Grains Baked goods made with fat, such as croissants, muffins, or some breads. Dry pasta or rice meal packs. Vegetables Creamed or fried vegetables. Vegetables in a cheese sauce. Regular canned vegetables (not low-sodium or reduced-sodium). Regular canned tomato sauce and paste (not low-sodium or reduced-sodium). Regular tomato and vegetable juice (not low-sodium or reduced-sodium). Angie Fava. Olives. Fruits Canned fruit in a light or heavy syrup. Fried fruit. Fruit in cream or butter sauce. Meat and other protein foods Fatty cuts of meat. Ribs. Fried meat. Berniece Salines. Sausage. Bologna and other processed lunch meats. Salami. Fatback. Hotdogs. Bratwurst. Salted nuts and seeds. Canned beans with added salt. Canned or smoked fish. Whole eggs or egg yolks. Chicken or [place] with skin. Dairy Whole or 2% milk, cream, and half-and-half.  Whole or full-fat cream cheese. Whole-fat or sweetened yogurt. Full-fat cheese. Nondairy creamers. Whipped toppings. Processed cheese and cheese spreads. Fats and oils Butter. Stick margarine. Lard. Shortening. Ghee. Bacon fat. Tropical oils, such as coconut, palm kernel, or palm oil. Seasoning and other foods Salted popcorn and pretzels. Onion salt, garlic salt, seasoned salt, table salt, and sea salt. Worcestershire sauce. Tartar sauce. Barbecue sauce. Teriyaki sauce. Soy sauce, including reduced-sodium. Steak sauce. Canned and packaged gravies. Fish sauce. Oyster sauce. Cocktail sauce. Horseradish that you find on the shelf. Ketchup. Mustard. Meat flavorings and tenderizers. Bouillon cubes. Hot sauce and Tabasco sauce. Premade or packaged marinades. Premade or packaged taco seasonings. Relishes. Regular salad dressings. Where to find more information:  National Heart, Lung, and [place]: https://wilson-eaton.com/  American Heart Association: www.heart.org Summary  The DASH eating plan is a healthy eating plan that has been shown to reduce high blood pressure (hypertension). It may also reduce your risk for type 2 diabetes, heart disease, and stroke.  With the DASH eating plan, you should limit salt (sodium) intake to 2,300 mg a day. If you have hypertension, you may need to reduce your sodium intake to 1,500 mg a day.  When on the DASH eating plan, aim to eat more fresh fruits and vegetables, whole grains, lean proteins,  low-fat dairy, and heart-healthy fats.  Work with your health care provider or diet and nutrition specialist (dietitian) to adjust your eating plan to your individual calorie needs. This information is not intended to replace advice given to you by your health care provider. Make sure you discuss any questions you have with your health care provider. Document Released: 03/11/2011 Document Revised: 03/15/2016 Document Reviewed: 03/15/2016 Elsevier Interactive Patient Education   2019 [employer].

## 2018-06-22 NOTE — Assessment & Plan Note (Signed)
Encouraged heart healthy diet, increase exercise, avoid trans fats, consider a krill oil cap daily 

## 2018-06-22 NOTE — Assessment & Plan Note (Signed)
Elevate legs Increase lasix 2 a day for 3-4 days

## 2018-06-22 NOTE — Assessment & Plan Note (Signed)
Well controlled, no changes to meds. Encouraged heart healthy diet such as the DASH diet and exercise as tolerated.  °

## 2018-06-23 ENCOUNTER — Encounter (HOSPITAL_COMMUNITY): Payer: Self-pay

## 2018-06-23 LAB — CBC WITH DIFFERENTIAL/PLATELET
Basophils Absolute: 0 10*3/uL (ref 0.0–0.1)
Basophils Relative: 0.7 % (ref 0.0–3.0)
Eosinophils Absolute: 0.1 10*3/uL (ref 0.0–0.7)
Eosinophils Relative: 1.2 % (ref 0.0–5.0)
HCT: 38.5 % (ref 36.0–46.0)
Hemoglobin: 12.6 g/dL (ref 12.0–15.0)
LYMPHS ABS: 1.9 10*3/uL (ref 0.7–4.0)
Lymphocytes Relative: 29.8 % (ref 12.0–46.0)
MCHC: 32.8 g/dL (ref 30.0–36.0)
MCV: 93.8 fl (ref 78.0–100.0)
Monocytes Absolute: 0.4 10*3/uL (ref 0.1–1.0)
Monocytes Relative: 7 % (ref 3.0–12.0)
NEUTROS PCT: 61.3 % (ref 43.0–77.0)
Neutro Abs: 3.8 10*3/uL (ref 1.4–7.7)
Platelets: 252 10*3/uL (ref 150.0–400.0)
RBC: 4.11 Mil/uL (ref 3.87–5.11)
RDW: 21.4 % — ABNORMAL HIGH (ref 11.5–15.5)
WBC: 6.3 10*3/uL (ref 4.0–10.5)

## 2018-06-23 LAB — COMPREHENSIVE METABOLIC PANEL
ALT: 11 U/L (ref 0–35)
AST: 11 U/L (ref 0–37)
Albumin: 4.1 g/dL (ref 3.5–5.2)
Alkaline Phosphatase: 53 U/L (ref 39–117)
BUN: 25 mg/dL — ABNORMAL HIGH (ref 6–23)
CO2: 25 meq/L (ref 19–32)
Calcium: 10.1 mg/dL (ref 8.4–10.5)
Chloride: 103 mEq/L (ref 96–112)
Creatinine, Ser: 1.9 mg/dL — ABNORMAL HIGH (ref 0.40–1.20)
GFR: 25.18 mL/min — ABNORMAL LOW (ref >60.00)
Glucose, Bld: 84 mg/dL (ref 70–99)
Potassium: 4.7 mEq/L (ref 3.5–5.1)
SODIUM: 140 meq/L (ref 135–145)
Total Bilirubin: 0.5 mg/dL (ref 0.2–1.2)
Total Protein: 6.8 g/dL (ref 6.0–8.3)

## 2018-06-23 LAB — LIPID PANEL
Cholesterol: 220 mg/dL — ABNORMAL HIGH (ref 0–200)
HDL: 62 mg/dL (ref >39.00)
LDL Cholesterol: 137 mg/dL — ABNORMAL HIGH (ref 0–99)
NONHDL: 157.98
Total CHOL/HDL Ratio: 4
Triglycerides: 106 mg/dL (ref 0.0–149.0)
VLDL: 21.2 mg/dL (ref 0.0–40.0)

## 2018-06-23 LAB — IBC + FERRITIN
FERRITIN: 86.3 ng/mL (ref 10.0–291.0)
Iron: 92 ug/dL (ref 42–145)
Saturation Ratios: 35 % (ref 20.0–50.0)
Transferrin: 188 mg/dL — ABNORMAL LOW (ref 212.0–360.0)

## 2018-06-30 ENCOUNTER — Inpatient Hospital Stay (HOSPITAL_COMMUNITY): Admission: RE | Admit: 2018-06-30 | Payer: Self-pay | Source: Ambulatory Visit

## 2018-06-30 ENCOUNTER — Encounter: Payer: Self-pay | Admitting: *Deleted

## 2018-07-07 DIAGNOSIS — M4800 Spinal stenosis, site unspecified: Secondary | ICD-10-CM | POA: Diagnosis not present

## 2018-07-07 DIAGNOSIS — E785 Hyperlipidemia, unspecified: Secondary | ICD-10-CM | POA: Diagnosis not present

## 2018-07-07 DIAGNOSIS — D631 Anemia in chronic kidney disease: Secondary | ICD-10-CM | POA: Diagnosis not present

## 2018-07-07 DIAGNOSIS — N184 Chronic kidney disease, stage 4 (severe): Secondary | ICD-10-CM | POA: Diagnosis not present

## 2018-07-07 DIAGNOSIS — N2581 Secondary hyperparathyroidism of renal origin: Secondary | ICD-10-CM | POA: Diagnosis not present

## 2018-07-13 DIAGNOSIS — M25552 Pain in left hip: Secondary | ICD-10-CM | POA: Diagnosis not present

## 2018-07-13 DIAGNOSIS — M25551 Pain in right hip: Secondary | ICD-10-CM | POA: Diagnosis not present

## 2018-07-13 DIAGNOSIS — M545 Low back pain: Secondary | ICD-10-CM | POA: Diagnosis not present

## 2018-07-30 ENCOUNTER — Other Ambulatory Visit: Payer: Self-pay | Admitting: Family Medicine

## 2018-07-30 DIAGNOSIS — I10 Essential (primary) hypertension: Secondary | ICD-10-CM

## 2018-07-30 DIAGNOSIS — R600 Localized edema: Secondary | ICD-10-CM

## 2018-09-04 ENCOUNTER — Telehealth: Payer: Self-pay

## 2018-09-04 NOTE — Telephone Encounter (Signed)
If she is doing well, we can offer her a telephone visit if she would be interested.

## 2018-09-04 NOTE — Telephone Encounter (Signed)
Spoke with the patient and she has been r/s for 09/12/2018 at 2:15pm. She stated that she would like to do a doxy.me visit but she doesn't have anyone to help her. Would this possibly be a in-office visit? Please advise.

## 2018-09-04 NOTE — Telephone Encounter (Signed)
Unable to get in contact with the patient to offer her a telephone visit due to the line being busy and no option to leave a voicemail. I will contact to her tomorrow morning.

## 2018-09-05 NOTE — Telephone Encounter (Signed)
Spoke to pt and made to telephone visit.  She wanted to relay that the lyrica, not helping and cannot continue to pay $200 monthly for this medication that is not helping.  I relayed that would let Judson Roch, NP know.

## 2018-09-07 ENCOUNTER — Ambulatory Visit: Payer: Medicare Other | Admitting: Adult Health

## 2018-09-07 ENCOUNTER — Ambulatory Visit: Payer: Medicare Other | Admitting: Neurology

## 2018-09-12 ENCOUNTER — Ambulatory Visit (INDEPENDENT_AMBULATORY_CARE_PROVIDER_SITE_OTHER): Payer: Medicare Other | Admitting: Neurology

## 2018-09-12 ENCOUNTER — Other Ambulatory Visit: Payer: Self-pay

## 2018-09-12 ENCOUNTER — Encounter: Payer: Self-pay | Admitting: Neurology

## 2018-09-12 DIAGNOSIS — G609 Hereditary and idiopathic neuropathy, unspecified: Secondary | ICD-10-CM

## 2018-09-12 MED ORDER — LYRICA 25 MG PO CAPS: ORAL_CAPSULE | ORAL | 5 refills | Status: DC

## 2018-09-12 MED ORDER — NORTRIPTYLINE HCL 10 MG PO CAPS: 10.0000 mg | ORAL_CAPSULE | Freq: Every day | ORAL | 3 refills | Status: DC

## 2018-09-12 MED ORDER — LYRICA 100 MG PO CAPS: 100.0000 mg | ORAL_CAPSULE | Freq: Every day | ORAL | 5 refills | Status: DC

## 2018-09-12 NOTE — Progress Notes (Signed)
Virtual Visit via Telephone Note  I connected with Krystal Henderson on 09/12/18 at  2:15 PM EDT by telephone and verified that I am speaking with the correct person using two identifiers.   I discussed the limitations, risks, security and privacy concerns of performing an evaluation and management service by telephone and the availability of in person appointments. I also discussed with the patient that there may be a patient responsible charge related to this service. The patient expressed understanding and agreed to proceed.   History of Present Illness: 09/12/2018 SS: Krystal Henderson is an 85 year old female with history of chronic back pain and neuropathy.  She is currently taking Lyrica 25 mg in the morning and at lunch, 100 mg at bedtime. In the past she has seen pain management for chronic back pain.  Today she complains of continued burning in her feet and legs related to her neuropathy.  She indicates that it is no worse, but is no better.  She does not feel that the Lyrica is very effective. She is having to pay $ 200 out of pocket for the Lyrica.  She is currently taking name brand Lyrica, she found the generic to be like taking water.  The burning in her legs and feet will wake her up around 4-5 am.  In the past she has tried gabapentin, but she could not tolerate it.  She has an allergy to Cymbalta.  She has not had any falls, she does use a walker.  She said that when she is walking her legs have a stinging sensation.  12/29/2017 MM: Krystal Henderson is an 85 year old female with a history of chronic back pain.  She returns today for follow-up.  She continues to take Lyrica 25 mg in the morning and at lunch and 100 mg at bedtime.  She is unsure how much Lyrica is actually helping.  She states that she has numbness in the feet and her feet always feel cold.  She denies any sharp shooting pain.  Denies any pins-and-needles sensation.  She states that she stands up sometimes and she gets dizzy.  She  uses a Rollator when ambulating.  Denies any falls.  She returns today for evaluation   Observations/Objective: Telephone call, is alert and oriented, good historian  Assessment and Plan: 1.  Peripheral neuropathy  She indicates that the burning in her feet and legs continues to be bothersome, is no better or no worse.  She says it will wake her up in the middle of the night between 4 and 5 AM.  She is taking Lyrica 25 mg in the morning and at lunch, 100 mg at bedtime.  She is requiring namebrand, because generic was not effective.  Unfortunately she is having to pay around $200 out of pocket.  She has tried gabapentin in the past but was unable to tolerate.  Apparently she had a reaction to Cymbalta in the past.  We will try nortriptyline 10 mg at bedtime.  If needed this medication could possibly be increased, but given her age, need to closely monitor for cognitive side effects.  If nortriptyline is not beneficial, we would try Keppra 250 mg twice daily.  We discussed that due to the expense of Lyrica, unsure if it is beneficial, she may try to decrease her dose.  She will first try to eliminate her morning dose for 1 week, then maybe eliminate her lunch dose.  She will restart the medication if her pain worsens.  I  have made her an office visit in 3 months.  She will call for any dose adjustments or problems or concerns. I have refilled her Lyrica and I checked controlled substance registry.   Follow Up Instructions: 3 months, December 19, 2018 1:45 pm   I discussed the assessment and treatment plan with the patient. The patient was provided an opportunity to ask questions and all were answered. The patient agreed with the plan and demonstrated an understanding of the instructions.   The patient was advised to call back or seek an in-person evaluation if the symptoms worsen or if the condition fails to improve as anticipated.  I provided 25 minutes of non-face-to-face time during this  encounter.   Evangeline Dakin, DNP  [facility] [address] [place], [place] [address] [phone]

## 2018-09-12 NOTE — Progress Notes (Signed)
I have read the note, and I agree with the clinical assessment and plan.  Charles K Willis   

## 2018-10-19 DIAGNOSIS — H0100A Unspecified blepharitis right eye, upper and lower eyelids: Secondary | ICD-10-CM | POA: Diagnosis not present

## 2018-10-19 DIAGNOSIS — H52203 Unspecified astigmatism, bilateral: Secondary | ICD-10-CM | POA: Diagnosis not present

## 2018-10-19 DIAGNOSIS — H31012 Macula scars of posterior pole (postinflammatory) (post-traumatic), left eye: Secondary | ICD-10-CM | POA: Diagnosis not present

## 2018-10-19 DIAGNOSIS — H04123 Dry eye syndrome of bilateral lacrimal glands: Secondary | ICD-10-CM | POA: Diagnosis not present

## 2018-10-31 ENCOUNTER — Other Ambulatory Visit: Payer: Self-pay | Admitting: *Deleted

## 2018-10-31 DIAGNOSIS — I10 Essential (primary) hypertension: Secondary | ICD-10-CM

## 2018-10-31 DIAGNOSIS — R600 Localized edema: Secondary | ICD-10-CM

## 2018-10-31 MED ORDER — FUROSEMIDE 20 MG PO TABS: ORAL_TABLET | ORAL | 0 refills | Status: DC

## 2018-11-02 ENCOUNTER — Other Ambulatory Visit: Payer: Self-pay | Admitting: Family Medicine

## 2018-11-02 DIAGNOSIS — I10 Essential (primary) hypertension: Secondary | ICD-10-CM

## 2018-11-05 ENCOUNTER — Encounter: Payer: Self-pay | Admitting: Family Medicine

## 2018-11-05 ENCOUNTER — Other Ambulatory Visit: Payer: Self-pay | Admitting: Family Medicine

## 2018-11-05 DIAGNOSIS — I10 Essential (primary) hypertension: Secondary | ICD-10-CM

## 2018-11-06 DIAGNOSIS — D631 Anemia in chronic kidney disease: Secondary | ICD-10-CM | POA: Diagnosis not present

## 2018-11-06 DIAGNOSIS — E785 Hyperlipidemia, unspecified: Secondary | ICD-10-CM | POA: Diagnosis not present

## 2018-11-06 DIAGNOSIS — N189 Chronic kidney disease, unspecified: Secondary | ICD-10-CM | POA: Diagnosis not present

## 2018-11-06 DIAGNOSIS — N2581 Secondary hyperparathyroidism of renal origin: Secondary | ICD-10-CM | POA: Diagnosis not present

## 2018-11-06 DIAGNOSIS — N184 Chronic kidney disease, stage 4 (severe): Secondary | ICD-10-CM | POA: Diagnosis not present

## 2018-12-18 NOTE — Progress Notes (Signed)
PATIENT: Krystal Henderson DOB: 1932/07/10  REASON FOR VISIT: follow up HISTORY FROM: patient  HISTORY OF PRESENT ILLNESS: Today 12/19/18  Krystal Henderson is an 85 year old female with history of chronic back pain and neuropathy.  She is currently doing well taking Lyrica 25 mg in the morning, 100 mg at bedtime, and nortriptyline 10 mg at bedtime.  She has able to decrease her dose of Lyrica to 25 mg once a day, and has continued her bedtime dose.  She does require brand-name Lyrica.  She says this is working to adequately control her pain.  She has not had recent fall.  She does use a walker for ambulation.  She indicates she is sleeping well.  She says her overall health has been good.  She presents today for follow-up unaccompanied.  She denies any new problems or concerns.  HISTORY 09/12/2018 SS: Krystal Henderson is an 85 year old female with history of chronic back pain and neuropathy.  She is currently taking Lyrica 25 mg in the morning and at lunch, 100 mg at bedtime. In the past she has seen pain management for chronic back pain.  Today she complains of continued burning in her feet and legs related to her neuropathy.  She indicates that it is no worse, but is no better.  She does not feel that the Lyrica is very effective. She is having to pay $ 200 out of pocket for the Lyrica.  She is currently taking name brand Lyrica, she found the generic to be like taking water.  The burning in her legs and feet will wake her up around 4-5 am.  In the past she has tried gabapentin, but she could not tolerate it.  She has an allergy to Cymbalta.  She has not had any falls, she does use a walker.  She said that when she is walking her legs have a stinging sensation.  REVIEW OF SYSTEMS: Out of a complete 14 system review of symptoms, the patient complains only of the following symptoms, and all other reviewed systems are negative.  Walking difficulty, leg swelling  ALLERGIES: Allergies  Allergen Reactions  .  Codeine Nausea Only  . Duloxetine Other (See Comments)    REACTION: intolerance--She does not remember taking this Rx- states she did not feel well   . Sertraline Hcl Other (See Comments)    REACTION: intolerance-- Did not help with Depression    HOME MEDICATIONS: Outpatient Medications Prior to Visit  Medication Sig Dispense Refill  . ALPRAZolam (XANAX) 0.25 MG tablet take 1 tablet by mouth every 8 to 12 hours if needed 30 tablet 1  . amLODipine (NORVASC) 5 MG tablet TAKE 1 TABLET BY MOUTH EVERY DAY 90 tablet 1  . Ascorbic Acid (VITAMIN C) 1000 MG tablet Take 1,000 mg by mouth daily.     [name] aspirin 81 MG tablet Take 81 mg by mouth daily.     . Calcium Carbonate (CALCIUM 600 PO) Take 1 capsule by mouth daily.     . Cholecalciferol (VITAMIN D) 2000 UNITS CAPS Take 1 capsule by mouth daily.     . fluticasone (FLONASE) 50 MCG/ACT nasal spray Place 2 sprays into both nostrils daily. 16 g 6  . furosemide (LASIX) 20 MG tablet TAKE 1 TABLET(20 MG) BY MOUTH DAILY 90 tablet 0  . HYDROcodone-acetaminophen (NORCO/VICODIN) 5-325 MG tablet take 1 tablet by mouth twice a day if needed  0  . loratadine (CLARITIN) 10 MG tablet Take 1 tablet (10 mg total) by  mouth daily. 30 tablet 11  . losartan (COZAAR) 100 MG tablet TAKE 1 TABLET BY MOUTH EVERY DAY 90 tablet 1  . LYRICA 100 MG capsule Take 1 capsule (100 mg total) by mouth at bedtime. 30 capsule 5  . LYRICA 25 MG capsule Take one capsule in AM and at NOON (Patient taking differently: Take 25 mg by mouth daily. Take one capsule in AM) 60 capsule 5  . meclizine (ANTIVERT) 25 MG tablet take 1/2 to 1 tablet by mouth once daily 30 tablet 0  . Multiple Vitamin (MULTIVITAMIN WITH MINERALS) TABS Take 1 tablet by mouth daily.    . nortriptyline (PAMELOR) 10 MG capsule Take 1 capsule (10 mg total) by mouth at bedtime. 60 capsule 3  . Omega-3 Fatty Acids (FISH OIL) 1000 MG CAPS Take 1 capsule by mouth daily.     [name] Glycol-Propyl Glycol (SYSTANE ULTRA OP)  Apply 1 drop to eye daily as needed (for dry eyes).    . polyethylene glycol (MIRALAX / GLYCOLAX) packet Take 17 g by mouth daily. 10 each 0   No facility-administered medications prior to visit.     PAST MEDICAL HISTORY: Past Medical History:  Diagnosis Date  . Anemia    hx of   . Angiodysplasia 2007   @ colonoscopy  . Anxiety    PMH of  . Chronic low back pain 06/21/2014  . Diverticulosis of colon (without mention of hemorrhage)   . DJD (degenerative joint disease)   . Esophageal reflux    inactive  . History of vertebral fracture 11/2015  . HTN (hypertension)   . Hyperlipemia 2006   LDL 130  . Hypoglycemia    reactive  . Pelvic fracture ([place]) 12/30/2011   [facility]  . Peripheral neuropathy   . Personal history of colonic polyps    adenomatous  . Vitamin B12 deficiency     PAST SURGICAL HISTORY: Past Surgical History:  Procedure Laterality Date  . cataract surgery  12-26-12 and 01-09-13  . COLONOSCOPY  2011   neg  . COLONOSCOPY W/ POLYPECTOMY     X 2 , Dr  Verl Blalock; angiodysplasia. Due 2022  . DILATION AND CURETTAGE OF UTERUS    . FACIAL COSMETIC SURGERY    . HEMORROIDECTOMY    . LUMBAR LAMINECTOMY/DECOMPRESSION MICRODISCECTOMY  09/10/2011   Procedure: LUMBAR LAMINECTOMY/DECOMPRESSION MICRODISCECTOMY;  Surgeon: Johnn Hai, MD;  Location: [facility];  Service: Orthopedics;  Laterality: N/A;  decompression laminectomy L2-3, L3-4, L4-5  . ORIF WRIST FRACTURE  01/11/2012   Procedure: OPEN REDUCTION INTERNAL FIXATION (ORIF) WRIST FRACTURE;  Surgeon: Tennis Must, MD;  Location: [facility];  Service: Orthopedics;  Laterality: Right;  . TEAR DUCT PROBING     X 2  . TOTAL HIP ARTHROPLASTY  2000   right  . TUBAL LIGATION      FAMILY HISTORY: Family History  Problem Relation Age of Onset  . Heart attack Father 31  . Kidney disease Mother        renal failure  . Throat cancer Sister        smoker  . Osteoporosis Sister   . Heart attack  Brother 82  . Stroke Brother 47       smoker  . Depression Maternal Uncle   . Cancer Brother        X3  lung cancer, all smokers  . Peripheral vascular disease Daughter   . Colon cancer Neg Hx   . Diabetes Neg Hx  SOCIAL HISTORY: Social History   Socioeconomic History  . Marital status: Married    Spouse name: Not on file  . Number of children: 1  . Years of education: hs  . Highest education level: Not on file  Occupational History  . Occupation: retired    Fish farm manager: RETIRED  Social Needs  . Financial resource strain: Not on file  . Food insecurity    Worry: Not on file    Inability: Not on file  . Transportation needs    Medical: Not on file    Non-medical: Not on file  Tobacco Use  . Smoking status: Never Smoker  . Smokeless tobacco: Never Used  Substance and Sexual Activity  . Alcohol use: Yes    Alcohol/week: 3.0 standard drinks    Types: 3 Glasses of wine per week    Comment: wine occasionally  . Drug use: No  . Sexual activity: Yes    Birth control/protection: Diaphragm  Lifestyle  . Physical activity    Days per week: Not on file    Minutes per session: Not on file  . Stress: Not on file  Relationships  . Social Herbalist on phone: Not on file    Gets together: Not on file    Attends religious service: Not on file    Active member of club or organization: Not on file    Attends meetings of clubs or organizations: Not on file    Relationship status: Not on file  . Intimate partner violence    Fear of current or ex partner: Not on file    Emotionally abused: Not on file    Physically abused: Not on file    Forced sexual activity: Not on file  Other Topics Concern  . Not on file  Social History Narrative   Patient is right handed.   Patient does not drink caffeine.    PHYSICAL EXAM  Vitals:   12/19/18 1328  BP: (!) 150/74  Pulse: 75  Temp: 97.8 F (36.6 C)  TempSrc: Oral  Weight: 181 lb 9.6 oz (82.4 kg)  Height: 5\' 5"   (1.651 m)   Body mass index is 30.22 kg/m.  Generalized: Well developed, in no acute distress   Neurological examination  Mentation: Alert oriented to time, place, history taking. Follows all commands speech and language fluent Cranial nerve II-XII: Pupils were equal round reactive to light. Extraocular movements were full, visual field were full on confrontational test. Facial sensation and strength were normal.  Head turning and shoulder shrug  were normal and symmetric. Motor: The motor testing reveals 5 over 5 strength of all 4 extremities. Good symmetric motor tone is noted throughout.  Sensory: Sensory testing is intact to soft touch on all 4 extremities. No evidence of extinction is noted.  Coordination: Cerebellar testing reveals good finger-nose-finger and heel-to-shin bilaterally.  Gait and station: Slow to rise from seated position, has to push off chair, gait is unsteady, uses a walker Reflexes: Deep tendon reflexes are symmetric and normal bilaterally.   DIAGNOSTIC DATA (LABS, IMAGING, TESTING) - I reviewed patient records, labs, notes, testing and imaging myself where available.  Lab Results  Component Value Date   WBC 6.3 06/22/2018   HGB 12.6 06/22/2018   HCT 38.5 06/22/2018   MCV 93.8 06/22/2018   PLT 252.0 06/22/2018      Component Value Date/Time   NA 140 06/22/2018 1521   K 4.7 06/22/2018 1521   CL 103 06/22/2018 1521  CO2 25 06/22/2018 1521   GLUCOSE 84 06/22/2018 1521   BUN 25 (H) 06/22/2018 1521   CREATININE 1.90 (H) 06/22/2018 1521   CALCIUM 10.1 06/22/2018 1521   PROT 6.8 06/22/2018 1521   ALBUMIN 4.1 06/22/2018 1521   AST 11 06/22/2018 1521   ALT 11 06/22/2018 1521   ALKPHOS 53 06/22/2018 1521   BILITOT 0.5 06/22/2018 1521   GFRNONAA 28 (L) 11/04/2015 0932   GFRAA 32 (L) 11/04/2015 0932   Lab Results  Component Value Date   CHOL 220 (H) 06/22/2018   HDL 62.00 06/22/2018   LDLCALC 137 (H) 06/22/2018   LDLDIRECT 129.0 10/18/2014   TRIG  106.0 06/22/2018   CHOLHDL 4 06/22/2018   Lab Results  Component Value Date   HGBA1C 5.6 08/24/2011   Lab Results  Component Value Date   [id number] 493 04/21/2015   Lab Results  Component Value Date   TSH 3.53 11/26/2013    ASSESSMENT AND PLAN 85 y.o. year old female  has a past medical history of Anemia, Angiodysplasia (2007), Anxiety, Chronic low back pain (06/21/2014), Diverticulosis of colon (without mention of hemorrhage), DJD (degenerative joint disease), Esophageal reflux, History of vertebral fracture (11/2015), HTN (hypertension), Hyperlipemia (2006), Hypoglycemia, Pelvic fracture ([place]) (12/30/2011), Peripheral neuropathy, Personal history of colonic polyps, and Vitamin B12 deficiency. here with:  1.  Peripheral neuropathy 2.  Chronic back pain  She will continue taking Lyrica 25 mg in the morning, 100 mg at bedtime.  She will remain on nortriptyline 10 mg at bedtime.  The combination of medications is working to adequately control her pain. She has required brand-name Lyrica.  She has been unable to tolerate gabapentin or Cymbalta in the past.  She will follow-up in 6 months or sooner if needed.  I did advise her symptoms worsen or she develops any new symptoms she should let us know.  I spent 15 minutes with the patient. 50% of this time was spent discussing her plan of care.   Butler Denmark, AGNP-C, DNP 12/19/2018, 1:50 PM [facility] [address], [place], [place] [address] [phone]

## 2018-12-19 ENCOUNTER — Other Ambulatory Visit: Payer: Self-pay

## 2018-12-19 ENCOUNTER — Ambulatory Visit (INDEPENDENT_AMBULATORY_CARE_PROVIDER_SITE_OTHER): Payer: Medicare Other | Admitting: Neurology

## 2018-12-19 ENCOUNTER — Encounter: Payer: Self-pay | Admitting: Neurology

## 2018-12-19 VITALS — BP 150/74 | HR 75 | Temp 97.8°F | Ht 65.0 in | Wt 181.6 lb

## 2018-12-19 DIAGNOSIS — G609 Hereditary and idiopathic neuropathy, unspecified: Secondary | ICD-10-CM | POA: Diagnosis not present

## 2018-12-19 MED ORDER — LYRICA 25 MG PO CAPS: ORAL_CAPSULE | ORAL | 5 refills | Status: DC

## 2018-12-19 MED ORDER — NORTRIPTYLINE HCL 10 MG PO CAPS: 10.0000 mg | ORAL_CAPSULE | Freq: Every day | ORAL | 1 refills | Status: DC

## 2018-12-19 NOTE — Patient Instructions (Signed)
1. Continue Lyrica 25 mg in the morning, 100 mg at bedtime 2. Continue nortriptyline 10 mg at bedtime

## 2018-12-19 NOTE — Progress Notes (Signed)
I have read the note, and I agree with the clinical assessment and plan.  Temple Sporer K Aero Drummonds   

## 2018-12-28 ENCOUNTER — Other Ambulatory Visit: Payer: Self-pay

## 2018-12-29 ENCOUNTER — Ambulatory Visit (INDEPENDENT_AMBULATORY_CARE_PROVIDER_SITE_OTHER): Payer: Medicare Other | Admitting: Family Medicine

## 2018-12-29 ENCOUNTER — Encounter: Payer: Self-pay | Admitting: Family Medicine

## 2018-12-29 VITALS — BP 130/80 | HR 80 | Temp 97.8°F | Resp 18 | Ht 65.0 in | Wt 180.0 lb

## 2018-12-29 DIAGNOSIS — L989 Disorder of the skin and subcutaneous tissue, unspecified: Secondary | ICD-10-CM

## 2018-12-29 DIAGNOSIS — Z0000 Encounter for general adult medical examination without abnormal findings: Secondary | ICD-10-CM

## 2018-12-29 DIAGNOSIS — H6122 Impacted cerumen, left ear: Secondary | ICD-10-CM | POA: Diagnosis not present

## 2018-12-29 DIAGNOSIS — D509 Iron deficiency anemia, unspecified: Secondary | ICD-10-CM | POA: Diagnosis not present

## 2018-12-29 DIAGNOSIS — H66002 Acute suppurative otitis media without spontaneous rupture of ear drum, left ear: Secondary | ICD-10-CM

## 2018-12-29 DIAGNOSIS — I10 Essential (primary) hypertension: Secondary | ICD-10-CM | POA: Diagnosis not present

## 2018-12-29 DIAGNOSIS — Z23 Encounter for immunization: Secondary | ICD-10-CM

## 2018-12-29 MED ORDER — CEFUROXIME AXETIL 500 MG PO TABS: 500.0000 mg | ORAL_TABLET | Freq: Two times a day (BID) | ORAL | 0 refills | Status: DC

## 2018-12-29 NOTE — Progress Notes (Addendum)
Subjective:     Krystal Henderson is a 85 y.o. female and is here for a comprehensive physical exam. The patient reports problems - growth on her head she would like [place] to look at and her L ear feels clogged.  Social History   Socioeconomic History  . Marital status: Married    Spouse name: Not on file  . Number of children: 1  . Years of education: hs  . Highest education level: Not on file  Occupational History  . Occupation: retired    Fish farm manager: RETIRED  Social Needs  . Financial resource strain: Not on file  . Food insecurity    Worry: Not on file    Inability: Not on file  . Transportation needs    Medical: Not on file    Non-medical: Not on file  Tobacco Use  . Smoking status: Never Smoker  . Smokeless tobacco: Never Used  Substance and Sexual Activity  . Alcohol use: Yes    Alcohol/week: 3.0 standard drinks    Types: 3 Glasses of wine per week    Comment: wine occasionally  . Drug use: No  . Sexual activity: Yes    Birth control/protection: Diaphragm  Lifestyle  . Physical activity    Days per week: Not on file    Minutes per session: Not on file  . Stress: Not on file  Relationships  . Social Herbalist on phone: Not on file    Gets together: Not on file    Attends religious service: Not on file    Active member of club or organization: Not on file    Attends meetings of clubs or organizations: Not on file    Relationship status: Not on file  . Intimate partner violence    Fear of current or ex partner: Not on file    Emotionally abused: Not on file    Physically abused: Not on file    Forced sexual activity: Not on file  Other Topics Concern  . Not on file  Social History Narrative   Patient is right handed.   Patient does not drink caffeine.   Health Maintenance  Topic Date Due  . TETANUS/TDAP  03/14/2021  . INFLUENZA VACCINE  Completed  . DEXA SCAN  Completed  . PNA vac Low Risk Adult  Completed    The following portions of the  patient's history were reviewed and updated as appropriate:  She  has a past medical history of Anemia, Angiodysplasia (2007), Anxiety, Chronic low back pain (06/21/2014), Diverticulosis of colon (without mention of hemorrhage), DJD (degenerative joint disease), Esophageal reflux, History of vertebral fracture (11/2015), HTN (hypertension), Hyperlipemia (2006), Hypoglycemia, Pelvic fracture ([place]) (12/30/2011), Peripheral neuropathy, Personal history of colonic polyps, and Vitamin B12 deficiency. She does not have any pertinent problems on file. She  has a past surgical history that includes Tear duct probing; Total hip arthroplasty (2000); Facial cosmetic surgery; Tubal ligation; Hemorroidectomy; Dilation and curettage of uterus; Colonoscopy w/ polypectomy; Lumbar laminectomy/decompression microdiscectomy (09/10/2011); ORIF wrist fracture (01/11/2012); cataract surgery (12-26-12 and 01-09-13); and Colonoscopy (2011). Her family history includes Cancer in her brother; Depression in her maternal uncle; Heart attack (age of onset: 40) in her brother; Heart attack (age of onset: 51) in her father; Kidney disease in her mother; Osteoporosis in her sister; Peripheral vascular disease in her daughter; Stroke (age of onset: 38) in her brother; Throat cancer in her sister. She  reports that she has never smoked. She has never  used smokeless tobacco. She reports current alcohol use of about 3.0 standard drinks of alcohol per week. She reports that she does not use drugs. She has a current medication list which includes the following prescription(s): alprazolam, amlodipine, vitamin c, aspirin, calcium carbonate, vitamin d, fluticasone, furosemide, hydrocodone-acetaminophen, loratadine, losartan, lyrica, lyrica, meclizine, multivitamin with minerals, nortriptyline, fish oil, polyethyl glycol-propyl glycol, polyethylene glycol, and cefuroxime. Current Outpatient Medications on File Prior to Visit  Medication Sig Dispense Refill   . ALPRAZolam (XANAX) 0.25 MG tablet take 1 tablet by mouth every 8 to 12 hours if needed 30 tablet 1  . amLODipine (NORVASC) 5 MG tablet TAKE 1 TABLET BY MOUTH EVERY DAY 90 tablet 1  . Ascorbic Acid (VITAMIN C) 1000 MG tablet Take 1,000 mg by mouth daily.     [name] aspirin 81 MG tablet Take 81 mg by mouth daily.     . Calcium Carbonate (CALCIUM 600 PO) Take 1 capsule by mouth daily.     . Cholecalciferol (VITAMIN D) 2000 UNITS CAPS Take 1 capsule by mouth daily.     . fluticasone (FLONASE) 50 MCG/ACT nasal spray Place 2 sprays into both nostrils daily. 16 g 6  . furosemide (LASIX) 20 MG tablet TAKE 1 TABLET(20 MG) BY MOUTH DAILY 90 tablet 0  . HYDROcodone-acetaminophen (NORCO/VICODIN) 5-325 MG tablet take 1 tablet by mouth twice a day if needed  0  . loratadine (CLARITIN) 10 MG tablet Take 1 tablet (10 mg total) by mouth daily. 30 tablet 11  . losartan (COZAAR) 100 MG tablet TAKE 1 TABLET BY MOUTH EVERY DAY 90 tablet 1  . LYRICA 100 MG capsule Take 1 capsule (100 mg total) by mouth at bedtime. 30 capsule 5  . LYRICA 25 MG capsule Take 1 capsule in the morning 30 capsule 5  . meclizine (ANTIVERT) 25 MG tablet take 1/2 to 1 tablet by mouth once daily 30 tablet 0  . Multiple Vitamin (MULTIVITAMIN WITH MINERALS) TABS Take 1 tablet by mouth daily.    . nortriptyline (PAMELOR) 10 MG capsule Take 1 capsule (10 mg total) by mouth at bedtime. 90 capsule 1  . Omega-3 Fatty Acids (FISH OIL) 1000 MG CAPS Take 1 capsule by mouth daily.     [name] Glycol-Propyl Glycol (SYSTANE ULTRA OP) Apply 1 drop to eye daily as needed (for dry eyes).    . polyethylene glycol (MIRALAX / GLYCOLAX) packet Take 17 g by mouth daily. 10 each 0   No current facility-administered medications on file prior to visit.    She is allergic to codeine; duloxetine; and sertraline hcl..  Review of Systems   Review of Systems  Constitutional: Negative for activity change, appetite change and fatigue.  HENT: Negative for ,  congestion, tinnitus and ear discharge.  + ear feeling full L ear Eyes: Negative for visual disturbance (see optho q1y -- vision corrected to 20/20 with glasses).  Respiratory: Negative for cough, chest tightness and shortness of breath.   Cardiovascular: Negative for chest pain, palpitations and leg swelling.  Gastrointestinal: Negative for abdominal pain, diarrhea, constipation and abdominal distention.  Genitourinary: Negative for urgency, frequency, decreased urine volume and difficulty urinating.  Musculoskeletal: Negative for back pain, arthralgias and gait problem.  Skin: Negative for color change, pallor and rash.  Neurological: Negative for dizziness, light-headedness, numbness and headaches.  Hematological: Negative for adenopathy. Does not bruise/bleed easily.  Psychiatric/Behavioral: Negative for suicidal ideas, confusion, sleep disturbance, self-injury, dysphoric mood, decreased concentration and agitation.  Pt is able to read  and write and can do all ADLs No risk for falling No abuse/ violence in home   Objective:    BP 130/80 (BP Location: Right Arm, Patient Position: Sitting, Cuff Size: Normal)   Pulse 80   Temp 97.8 F (36.6 C) (Temporal)   Resp 18   Ht 5\' 5"  (1.651 m)   Wt 180 lb (81.6 kg)   SpO2 97%   BMI 29.95 kg/m  General appearance: alert, cooperative, appears stated age and no distress Head: Normocephalic, without obvious abnormality, atraumatic Eyes: negative findings: lids and lashes normal, conjunctivae and sclerae normal and pupils equal, round, reactive to light and accomodation Ears: R ear normal, L ear-+ cerumen impaction unable to use cerumen hoop , irriigated successfully tmi , + errythema  Nose: Nares normal. Septum midline. Mucosa normal. No drainage or sinus tenderness. Throat: lips, mucosa, and tongue normal; teeth and gums normal Neck: no adenopathy, no carotid bruit, no JVD, supple, symmetrical, trachea midline and thyroid not enlarged,  symmetric, no tenderness/mass/nodules Back: symmetric, no curvature. ROM normal. No CVA tenderness. Lungs: clear to auscultation bilaterally Breasts: deferred  Heart: regular rate and rhythm, S1, S2 normal, no murmur, click, rub or gallop Abdomen: soft, non-tender; bowel sounds normal; no masses,  no organomegaly Pelvic: not indicated; post-menopausal, no abnormal Pap smears in past Extremities: extremities normal, atraumatic, no cyanosis or edema Pulses: 2+ and symmetric Skin: Skin color, texture, turgor normal. No rashes or lesions Lymph nodes: Cervical, supraclavicular, and axillary nodes normal. Neurologic: Alert and oriented X 3, normal strength and tone. Normal symmetric reflexes. Normal coordination and gait    Assessment:    Healthy female exam.      Plan:    ghm utd Check labs  See After Visit Summary for Counseling Recommendations    1. Need for influenza vaccination  - Flu Vaccine QUAD High Dose(Fluad)  2. Essential hypertension Well controlled, no changes to meds. Encouraged heart healthy diet such as the DASH diet and exercise as tolerated.   - Lipid panel; Future - CBC with Differential/Platelet; Future - Comprehensive metabolic panel; Future - Iron, TIBC and Ferritin Panel - Comprehensive metabolic panel - CBC with Differential/Platelet - Lipid panel  3. Iron deficiency anemia, unspecified iron deficiency anemia type .check labs  - Lipid panel; Future - CBC with Differential/Platelet; Future - Comprehensive metabolic panel; Future - Iron, TIBC and Ferritin Panel - Comprehensive metabolic panel - CBC with Differential/Platelet - Lipid panel  4. Non-recurrent acute suppurative otitis media of left ear without spontaneous rupture of tympanic membrane abx per orders rto prn  - cefUROXime (CEFTIN) 500 MG tablet; Take 1 tablet (500 mg total) by mouth 2 (two) times daily with a meal.  Dispense: 20 tablet; Refill: 0

## 2018-12-29 NOTE — Patient Instructions (Signed)
Preventive Care 38 Years and Older, Female Preventive care refers to lifestyle choices and visits with your health care provider that can promote health and wellness. This includes:  A yearly physical exam. This is also called an annual well check.  Regular dental and eye exams.  Immunizations.  Screening for certain conditions.  Healthy lifestyle choices, such as diet and exercise. What can I expect for my preventive care visit? Physical exam Your health care provider will check:  Height and weight. These may be used to calculate body mass index (BMI), which is a measurement that tells if you are at a healthy weight.  Heart rate and blood pressure.  Your skin for abnormal spots. Counseling Your health care provider may ask you questions about:  Alcohol, tobacco, and drug use.  Emotional well-being.  Home and relationship well-being.  Sexual activity.  Eating habits.  History of falls.  Memory and ability to understand (cognition).  Work and work Statistician.  Pregnancy and menstrual history. What immunizations do I need?  Influenza (flu) vaccine  This is recommended every year. Tetanus, diphtheria, and pertussis (Tdap) vaccine  You may need a Td booster every 10 years. Varicella (chickenpox) vaccine  You may need this vaccine if you have not already been vaccinated. Zoster (shingles) vaccine  You may need this after age 33. Pneumococcal conjugate (PCV13) vaccine  One dose is recommended after age 33. Pneumococcal polysaccharide (PPSV23) vaccine  One dose is recommended after age 72. Measles, mumps, and rubella (MMR) vaccine  You may need at least one dose of MMR if you were born in 1957 or later. You may also need a second dose. Meningococcal conjugate (MenACWY) vaccine  You may need this if you have certain conditions. Hepatitis A vaccine  You may need this if you have certain conditions or if you travel or work in places where you may be exposed  to hepatitis A. Hepatitis B vaccine  You may need this if you have certain conditions or if you travel or work in places where you may be exposed to hepatitis B. Haemophilus influenzae type b (Hib) vaccine  You may need this if you have certain conditions. You may receive vaccines as individual doses or as more than one vaccine together in one shot (combination vaccines). Talk with your health care provider about the risks and benefits of combination vaccines. What tests do I need? Blood tests  Lipid and cholesterol levels. These may be checked every 5 years, or more frequently depending on your overall health.  Hepatitis C test.  Hepatitis B test. Screening  Lung cancer screening. You may have this screening every year starting at age 39 if you have a 30-pack-year history of smoking and currently smoke or have quit within the past 15 years.  Colorectal cancer screening. All adults should have this screening starting at age 36 and continuing until age 15. Your health care provider may recommend screening at age 23 if you are at increased risk. You will have tests every 1-10 years, depending on your results and the type of screening test.  Diabetes screening. This is done by checking your blood sugar (glucose) after you have not eaten for a while (fasting). You may have this done every 1-3 years.  Mammogram. This may be done every 1-2 years. Talk with your health care provider about how often you should have regular mammograms.  BRCA-related cancer screening. This may be done if you have a family history of breast, ovarian, tubal, or peritoneal cancers.  Other tests  Sexually transmitted disease (STD) testing.  Bone density scan. This is done to screen for osteoporosis. You may have this done starting at age 76. Follow these instructions at home: Eating and drinking  Eat a diet that includes fresh fruits and vegetables, whole grains, lean protein, and low-fat dairy products. Limit  your intake of foods with high amounts of sugar, saturated fats, and salt.  Take vitamin and mineral supplements as recommended by your health care provider.  Do not drink alcohol if your health care provider tells you not to drink.  If you drink alcohol: ? Limit how much you have to 0-1 drink a day. ? Be aware of how much alcohol is in your drink. In the [place] one drink equals one 12 oz bottle of beer (355 mL), one 5 oz glass of wine (148 mL), or one 1 oz glass of hard liquor (44 mL). Lifestyle  Take daily care of your teeth and gums.  Stay active. Exercise for at least 30 minutes on 5 or more days each week.  Do not use any products that contain nicotine or tobacco, such as cigarettes, e-cigarettes, and chewing tobacco. If you need help quitting, ask your health care provider.  If you are sexually active, practice safe sex. Use a condom or other form of protection in order to prevent STIs (sexually transmitted infections).  Talk with your health care provider about taking a low-dose aspirin or statin. What's next?  Go to your health care provider once a year for a well check visit.  Ask your health care provider how often you should have your eyes and teeth checked.  Stay up to date on all vaccines. This information is not intended to replace advice given to you by your health care provider. Make sure you discuss any questions you have with your health care provider. Document Released: 04/18/2015 Document Revised: 03/16/2018 Document Reviewed: 03/16/2018 Elsevier Patient Education  2020 [employer].

## 2018-12-30 DIAGNOSIS — L989 Disorder of the skin and subcutaneous tissue, unspecified: Secondary | ICD-10-CM | POA: Insufficient documentation

## 2018-12-30 LAB — COMPREHENSIVE METABOLIC PANEL
AG Ratio: 1.5 (calc) (ref 1.0–2.5)
ALT: 10 U/L (ref 6–29)
AST: 11 U/L (ref 10–35)
Albumin: 4 g/dL (ref 3.6–5.1)
Alkaline phosphatase (APISO): 50 U/L (ref 37–153)
BUN/Creatinine Ratio: 12 (calc) (ref 6–22)
BUN: 24 mg/dL (ref 7–25)
CO2: 21 mmol/L (ref 20–32)
Calcium: 9.8 mg/dL (ref 8.6–10.4)
Chloride: 103 mmol/L (ref 98–110)
Creat: 1.98 mg/dL — ABNORMAL HIGH (ref 0.60–0.88)
Globulin: 2.6 g/dL (calc) (ref 1.9–3.7)
Glucose, Bld: 89 mg/dL (ref 65–99)
Potassium: 4.9 mmol/L (ref 3.5–5.3)
Sodium: 140 mmol/L (ref 135–146)
Total Bilirubin: 0.5 mg/dL (ref 0.2–1.2)
Total Protein: 6.6 g/dL (ref 6.1–8.1)

## 2018-12-30 LAB — CBC WITH DIFFERENTIAL/PLATELET
Absolute Monocytes: 568 cells/uL (ref 200–950)
Basophils Absolute: 53 cells/uL (ref 0–200)
Basophils Relative: 0.8 %
Eosinophils Absolute: 59 cells/uL (ref 15–500)
Eosinophils Relative: 0.9 %
HCT: 38.2 % (ref 35.0–45.0)
Hemoglobin: 12.7 g/dL (ref 11.7–15.5)
Lymphs Abs: 1848 cells/uL (ref 850–3900)
MCH: 31.8 pg (ref 27.0–33.0)
MCHC: 33.2 g/dL (ref 32.0–36.0)
MCV: 95.5 fL (ref 80.0–100.0)
MPV: 11.7 fL (ref 7.5–12.5)
Monocytes Relative: 8.6 %
Neutro Abs: 4072 cells/uL (ref 1500–7800)
Neutrophils Relative %: 61.7 %
Platelets: 312 10*3/uL (ref 140–400)
RBC: 4 10*6/uL (ref 3.80–5.10)
RDW: 12.8 % (ref 11.0–15.0)
Total Lymphocyte: 28 %
WBC: 6.6 10*3/uL (ref 3.8–10.8)

## 2018-12-30 LAB — IRON,TIBC AND FERRITIN PANEL
%SAT: 33 % (calc) (ref 16–45)
Ferritin: 92 ng/mL (ref 16–288)
Iron: 83 ug/dL (ref 45–160)
TIBC: 252 mcg/dL (calc) (ref 250–450)

## 2018-12-30 LAB — LIPID PANEL
Cholesterol: 217 mg/dL — ABNORMAL HIGH (ref <200)
HDL: 69 mg/dL (ref >=50)
LDL Cholesterol (Calc): 126 mg/dL (calc) — ABNORMAL HIGH
Non-HDL Cholesterol (Calc): 148 mg/dL (calc) — ABNORMAL HIGH (ref <130)
Total CHOL/HDL Ratio: 3.1 (calc) (ref <5.0)
Triglycerides: 116 mg/dL (ref <150)

## 2018-12-30 NOTE — Assessment & Plan Note (Signed)
Pt will f/u with her derm

## 2019-01-03 DIAGNOSIS — L820 Inflamed seborrheic keratosis: Secondary | ICD-10-CM | POA: Diagnosis not present

## 2019-01-03 DIAGNOSIS — D485 Neoplasm of uncertain behavior of skin: Secondary | ICD-10-CM | POA: Diagnosis not present

## 2019-01-03 DIAGNOSIS — L218 Other seborrheic dermatitis: Secondary | ICD-10-CM | POA: Diagnosis not present

## 2019-01-03 DIAGNOSIS — H6122 Impacted cerumen, left ear: Secondary | ICD-10-CM | POA: Insufficient documentation

## 2019-01-03 DIAGNOSIS — L738 Other specified follicular disorders: Secondary | ICD-10-CM | POA: Diagnosis not present

## 2019-01-03 NOTE — Assessment & Plan Note (Signed)
Pt ears were irrigated successfully She gave verbal consent and tolerated the procedure

## 2019-01-05 DIAGNOSIS — M5136 Other intervertebral disc degeneration, lumbar region: Secondary | ICD-10-CM | POA: Diagnosis not present

## 2019-02-06 ENCOUNTER — Encounter: Payer: Self-pay | Admitting: Family Medicine

## 2019-02-06 ENCOUNTER — Other Ambulatory Visit: Payer: Self-pay | Admitting: *Deleted

## 2019-02-06 DIAGNOSIS — R600 Localized edema: Secondary | ICD-10-CM

## 2019-02-06 DIAGNOSIS — I10 Essential (primary) hypertension: Secondary | ICD-10-CM

## 2019-02-06 MED ORDER — FUROSEMIDE 20 MG PO TABS: ORAL_TABLET | ORAL | 1 refills | Status: DC

## 2019-03-06 DIAGNOSIS — E785 Hyperlipidemia, unspecified: Secondary | ICD-10-CM | POA: Diagnosis not present

## 2019-03-06 DIAGNOSIS — N184 Chronic kidney disease, stage 4 (severe): Secondary | ICD-10-CM | POA: Diagnosis not present

## 2019-03-06 DIAGNOSIS — N2581 Secondary hyperparathyroidism of renal origin: Secondary | ICD-10-CM | POA: Diagnosis not present

## 2019-03-06 DIAGNOSIS — N189 Chronic kidney disease, unspecified: Secondary | ICD-10-CM | POA: Diagnosis not present

## 2019-03-06 DIAGNOSIS — I5030 Unspecified diastolic (congestive) heart failure: Secondary | ICD-10-CM | POA: Diagnosis not present

## 2019-03-08 ENCOUNTER — Other Ambulatory Visit: Payer: Self-pay | Admitting: *Deleted

## 2019-03-08 MED ORDER — LYRICA 100 MG PO CAPS: 100.0000 mg | ORAL_CAPSULE | Freq: Every day | ORAL | 5 refills | Status: DC

## 2019-03-08 NOTE — Telephone Encounter (Signed)
Drug registry checked last fill 100mg  lyrica 02-06-19 #30.

## 2019-03-26 ENCOUNTER — Other Ambulatory Visit: Payer: Self-pay | Admitting: Gastroenterology

## 2019-03-26 DIAGNOSIS — R194 Change in bowel habit: Secondary | ICD-10-CM

## 2019-03-27 DIAGNOSIS — R194 Change in bowel habit: Secondary | ICD-10-CM | POA: Diagnosis not present

## 2019-04-02 DIAGNOSIS — R194 Change in bowel habit: Secondary | ICD-10-CM | POA: Diagnosis not present

## 2019-04-10 ENCOUNTER — Ambulatory Visit
Admission: RE | Admit: 2019-04-10 | Discharge: 2019-04-10 | Disposition: A | Discharge status: Home / Self Care | Payer: Medicare Other | Source: Ambulatory Visit | Attending: Gastroenterology | Admitting: Gastroenterology

## 2019-04-10 DIAGNOSIS — K5730 Diverticulosis of large intestine without perforation or abscess without bleeding: Secondary | ICD-10-CM | POA: Diagnosis not present

## 2019-04-10 DIAGNOSIS — R194 Change in bowel habit: Secondary | ICD-10-CM

## 2019-04-25 DIAGNOSIS — K449 Diaphragmatic hernia without obstruction or gangrene: Secondary | ICD-10-CM | POA: Diagnosis not present

## 2019-04-25 DIAGNOSIS — K5730 Diverticulosis of large intestine without perforation or abscess without bleeding: Secondary | ICD-10-CM | POA: Diagnosis not present

## 2019-04-30 ENCOUNTER — Other Ambulatory Visit: Payer: Self-pay | Admitting: Family Medicine

## 2019-04-30 DIAGNOSIS — I10 Essential (primary) hypertension: Secondary | ICD-10-CM

## 2019-05-11 ENCOUNTER — Other Ambulatory Visit: Payer: Medicare Other

## 2019-05-14 ENCOUNTER — Ambulatory Visit: Payer: Medicare Other | Attending: Internal Medicine

## 2019-05-14 DIAGNOSIS — Z23 Encounter for immunization: Secondary | ICD-10-CM

## 2019-05-14 NOTE — Progress Notes (Signed)
   Covid-19 Vaccination Clinic  Name:  Krystal Henderson    MRN: [id number] DOB: 1932/09/27  05/14/2019  Krystal Henderson was observed post Covid-19 immunization for 15 minutes without incidence. She was provided with Vaccine Information Sheet and instruction to access the V-Safe system.   Krystal Henderson was instructed to call 911 with any severe reactions post vaccine: [name] Difficulty breathing  . Swelling of your face and throat  . A fast heartbeat  . A bad rash all over your body  . Dizziness and weakness    Immunizations Administered    Name Date Dose VIS Date Route   Pfizer COVID-19 Vaccine 05/14/2019  5:04 PM 0.3 mL 03/16/2019 Intramuscular   Manufacturer: [place]   Lot: [id number]   [place]: [id number]

## 2019-06-08 ENCOUNTER — Ambulatory Visit: Payer: Medicare Other | Attending: Internal Medicine

## 2019-06-08 DIAGNOSIS — Z23 Encounter for immunization: Secondary | ICD-10-CM

## 2019-06-08 NOTE — Progress Notes (Signed)
   Covid-19 Vaccination Clinic  Name:  Krystal Henderson    MRN: [id number] DOB: 02/12/1933  06/08/2019  [name] was observed post Covid-19 immunization for 15 minutes without incident. She was provided with Vaccine Information Sheet and instruction to access the V-Safe system.   [name] was instructed to call 911 with any severe reactions post vaccine: [name] Difficulty breathing  . Swelling of face and throat  . A fast heartbeat  . A bad rash all over body  . Dizziness and weakness   Immunizations Administered    Name Date Dose VIS Date Route   Pfizer COVID-19 Vaccine 06/08/2019  3:12 PM 0.3 mL 03/16/2019 Intramuscular   Manufacturer: [place]   Lot: [id number]   [place]: [id number]

## 2019-06-12 ENCOUNTER — Telehealth: Payer: Self-pay | Admitting: Neurology

## 2019-06-12 MED ORDER — LYRICA 25 MG PO CAPS: ORAL_CAPSULE | ORAL | 5 refills | Status: DC

## 2019-06-12 NOTE — Telephone Encounter (Signed)
Received request from [facility] for Lyrica 25 mg capsule refill.  Take 1 in the morning. Last filled 05/08/2019 # 30 capsules. Rx sent.

## 2019-06-20 ENCOUNTER — Ambulatory Visit: Payer: Medicare Other | Admitting: Neurology

## 2019-06-20 ENCOUNTER — Other Ambulatory Visit: Payer: Self-pay

## 2019-06-20 ENCOUNTER — Encounter: Payer: Self-pay | Admitting: Neurology

## 2019-06-20 VITALS — BP 115/72 | HR 80 | Temp 97.1°F | Wt 184.0 lb

## 2019-06-20 DIAGNOSIS — G609 Hereditary and idiopathic neuropathy, unspecified: Secondary | ICD-10-CM

## 2019-06-20 DIAGNOSIS — M545 Low back pain, unspecified: Secondary | ICD-10-CM

## 2019-06-20 DIAGNOSIS — G8929 Other chronic pain: Secondary | ICD-10-CM | POA: Diagnosis not present

## 2019-06-20 NOTE — Progress Notes (Signed)
PATIENT: Krystal Henderson DOB: May 20, 1932  REASON FOR VISIT: follow up HISTORY FROM: patient  HISTORY OF PRESENT ILLNESS: Today 06/20/19  Krystal Henderson is an 86 year old female with history of chronic back pain and neuropathy.  She remains on Lyrica 25 mg in the morning, 100 mg at bedtime and nortriptyline 10 mg at bedtime.  She requires brand-name Lyrica.  She has been unable to tolerate gabapentin or Cymbalta in the past.  Her symptoms are well controlled during the day, she goes to bed around 1030, she wakes between 5 AM and 7 AM with burning in her feet.  She uses a walker for ambulation.  She has not had recent fall.  She denies any new problems or concerns.  She does not want to add any more medications.  She presents today for evaluation unaccompanied.  HISTORY 12/19/2018 SS: Krystal Henderson is an 86 year old female with history of chronic back pain and neuropathy.  She is currently doing well taking Lyrica 25 mg in the morning, 100 mg at bedtime, and nortriptyline 10 mg at bedtime.  She has able to decrease her dose of Lyrica to 25 mg once a day, and has continued her bedtime dose.  She does require brand-name Lyrica.  She says this is working to adequately control her pain.  She has not had recent fall.  She does use a walker for ambulation.  She indicates she is sleeping well.  She says her overall health has been good.  She presents today for follow-up unaccompanied.  She denies any new problems or concerns.   REVIEW OF SYSTEMS: Out of a complete 14 system review of symptoms, the patient complains only of the following symptoms, and all other reviewed systems are negative.  Burning  ALLERGIES: Allergies  Allergen Reactions  . Codeine Nausea Only  . Duloxetine Other (See Comments)    REACTION: intolerance--She does not remember taking this Rx- states she did not feel well   . Sertraline Hcl Other (See Comments)    REACTION: intolerance-- Did not help with Depression    HOME  MEDICATIONS: Outpatient Medications Prior to Visit  Medication Sig Dispense Refill  . ALPRAZolam (XANAX) 0.25 MG tablet take 1 tablet by mouth every 8 to 12 hours if needed 30 tablet 1  . amLODipine (NORVASC) 5 MG tablet TAKE 1 TABLET BY MOUTH EVERY DAY 90 tablet 1  . Ascorbic Acid (VITAMIN C) 1000 MG tablet Take 1,000 mg by mouth daily.     [name] aspirin 81 MG tablet Take 81 mg by mouth daily.     . Calcium Carbonate (CALCIUM 600 PO) Take 1 capsule by mouth daily.     . cefUROXime (CEFTIN) 500 MG tablet Take 1 tablet (500 mg total) by mouth 2 (two) times daily with a meal. 20 tablet 0  . Cholecalciferol (VITAMIN D) 2000 UNITS CAPS Take 1 capsule by mouth daily.     . fluticasone (FLONASE) 50 MCG/ACT nasal spray Place 2 sprays into both nostrils daily. 16 g 6  . furosemide (LASIX) 20 MG tablet TAKE 1 TABLET(20 MG) BY MOUTH DAILY 90 tablet 1  . HYDROcodone-acetaminophen (NORCO/VICODIN) 5-325 MG tablet take 1 tablet by mouth twice a day if needed  0  . loratadine (CLARITIN) 10 MG tablet Take 1 tablet (10 mg total) by mouth daily. 30 tablet 11  . losartan (COZAAR) 100 MG tablet TAKE 1 TABLET BY MOUTH EVERY DAY 90 tablet 1  . LYRICA 100 MG capsule Take 1 capsule (100 mg  total) by mouth at bedtime. 30 capsule 5  . LYRICA 25 MG capsule Take 1 capsule in the morning 30 capsule 5  . meclizine (ANTIVERT) 25 MG tablet take 1/2 to 1 tablet by mouth once daily 30 tablet 0  . Multiple Vitamin (MULTIVITAMIN WITH MINERALS) TABS Take 1 tablet by mouth daily.    . Omega-3 Fatty Acids (FISH OIL) 1000 MG CAPS Take 1 capsule by mouth daily.     [name] Glycol-Propyl Glycol (SYSTANE ULTRA OP) Apply 1 drop to eye daily as needed (for dry eyes).    . polyethylene glycol (MIRALAX / GLYCOLAX) packet Take 17 g by mouth daily. 10 each 0  . VALTREX 1 g tablet Take 1,000 mg by mouth 2 (two) times daily.    . nortriptyline (PAMELOR) 10 MG capsule Take 1 capsule (10 mg total) by mouth at bedtime. 90 capsule 1  .  Nortriptyline HCl (PAMELOR PO) nortriptyline     No facility-administered medications prior to visit.    PAST MEDICAL HISTORY: Past Medical History:  Diagnosis Date  . Anemia    hx of   . Angiodysplasia 2007   @ colonoscopy  . Anxiety    PMH of  . Chronic low back pain 06/21/2014  . Diverticulosis of colon (without mention of hemorrhage)   . DJD (degenerative joint disease)   . Esophageal reflux    inactive  . History of vertebral fracture 11/2015  . HTN (hypertension)   . Hyperlipemia 2006   LDL 130  . Hypoglycemia    reactive  . Pelvic fracture ([place]) 12/30/2011   [facility]  . Peripheral neuropathy   . Personal history of colonic polyps    adenomatous  . Vitamin B12 deficiency     PAST SURGICAL HISTORY: Past Surgical History:  Procedure Laterality Date  . cataract surgery  12-26-12 and 01-09-13  . COLONOSCOPY  2011   neg  . COLONOSCOPY W/ POLYPECTOMY     X 2 , Dr  Verl Blalock; angiodysplasia. Due 2022  . DILATION AND CURETTAGE OF UTERUS    . FACIAL COSMETIC SURGERY    . HEMORROIDECTOMY    . LUMBAR LAMINECTOMY/DECOMPRESSION MICRODISCECTOMY  09/10/2011   Procedure: LUMBAR LAMINECTOMY/DECOMPRESSION MICRODISCECTOMY;  Surgeon: Johnn Hai, MD;  Location: [facility];  Service: Orthopedics;  Laterality: N/A;  decompression laminectomy L2-3, L3-4, L4-5  . ORIF WRIST FRACTURE  01/11/2012   Procedure: OPEN REDUCTION INTERNAL FIXATION (ORIF) WRIST FRACTURE;  Surgeon: Tennis Must, MD;  Location: [facility];  Service: Orthopedics;  Laterality: Right;  . TEAR DUCT PROBING     X 2  . TOTAL HIP ARTHROPLASTY  2000   right  . TUBAL LIGATION      FAMILY HISTORY: Family History  Problem Relation Age of Onset  . Heart attack Father 52  . Kidney disease Mother        renal failure  . Throat cancer Sister        smoker  . Osteoporosis Sister   . Heart attack Brother 80  . Stroke Brother 42       smoker  . Depression Maternal Uncle   . Cancer Brother          X3  lung cancer, all smokers  . Peripheral vascular disease Daughter   . Colon cancer Neg Hx   . Diabetes Neg Hx     SOCIAL HISTORY: Social History   Socioeconomic History  . Marital status: Married    Spouse name: Not  on file  . Number of children: 1  . Years of education: hs  . Highest education level: Not on file  Occupational History  . Occupation: retired    Fish farm manager: RETIRED  Tobacco Use  . Smoking status: Never Smoker  . Smokeless tobacco: Never Used  Substance and Sexual Activity  . Alcohol use: Yes    Alcohol/week: 3.0 standard drinks    Types: 3 Glasses of wine per week    Comment: wine occasionally  . Drug use: No  . Sexual activity: Yes    Birth control/protection: Diaphragm  Other Topics Concern  . Not on file  Social History Narrative   Patient is right handed.   Patient does not drink caffeine.   Social Determinants of Health   Financial Resource Strain:   . Difficulty of Paying Living Expenses:   Food Insecurity:   . Worried About Charity fundraiser in the Last Year:   . Arboriculturist in the Last Year:   Transportation Needs:   . Film/video editor (Medical):   [name] Lack of Transportation (Non-Medical):   Physical Activity:   . Days of Exercise per Week:   . Minutes of Exercise per Session:   Stress:   . Feeling of Stress :   Social Connections:   . Frequency of Communication with Friends and Family:   . Frequency of Social Gatherings with Friends and Family:   . Attends Religious Services:   . Active Member of Clubs or Organizations:   . Attends Archivist Meetings:   [name] Marital Status:   Intimate Partner Violence:   . Fear of Current or Ex-Partner:   . Emotionally Abused:   [name] Physically Abused:   . Sexually Abused:    PHYSICAL EXAM  Vitals:   06/20/19 1502  BP: 115/72  Pulse: 80  Temp: (!) 97.1 F (36.2 C)  Weight: 184 lb (83.5 kg)   Body mass index is 30.62 kg/m.  Generalized: Well developed, in no  acute distress   Neurological examination  Mentation: Alert oriented to time, place, history taking. Follows all commands speech and language fluent Cranial nerve II-XII: Pupils were equal round reactive to light. Extraocular movements were full, visual field were full on confrontational test. Facial sensation and strength were normal.  Head turning and shoulder shrug  were normal and symmetric. Motor: Good strength of all extremities Sensory: Sensory testing is intact to soft touch on all 4 extremities. No evidence of extinction is noted.  Coordination: Cerebellar testing reveals good finger-nose-finger bilaterally to perform heel-to-shin due to orthopedic issues Gait and station: Slow to rise from seated position with pushoff, with a walker, gait is forward leaning, wide-based.  Reflexes: Deep tendon reflexes are symmetric   DIAGNOSTIC DATA (LABS, IMAGING, TESTING) - I reviewed patient records, labs, notes, testing and imaging myself where available.  Lab Results  Component Value Date   WBC 6.6 12/29/2018   HGB 12.7 12/29/2018   HCT 38.2 12/29/2018   MCV 95.5 12/29/2018   PLT 312 12/29/2018      Component Value Date/Time   NA 140 12/29/2018 1542   K 4.9 12/29/2018 1542   CL 103 12/29/2018 1542   CO2 21 12/29/2018 1542   GLUCOSE 89 12/29/2018 1542   BUN 24 12/29/2018 1542   CREATININE 1.98 (H) 12/29/2018 1542   CALCIUM 9.8 12/29/2018 1542   PROT 6.6 12/29/2018 1542   ALBUMIN 4.1 06/22/2018 1521   AST 11 12/29/2018 1542   ALT  10 12/29/2018 1542   ALKPHOS 53 06/22/2018 1521   BILITOT 0.5 12/29/2018 1542   GFRNONAA 28 (L) 11/04/2015 0932   GFRAA 32 (L) 11/04/2015 0932   Lab Results  Component Value Date   CHOL 217 (H) 12/29/2018   HDL 69 12/29/2018   LDLCALC 126 (H) 12/29/2018   LDLDIRECT 129.0 10/18/2014   TRIG 116 12/29/2018   CHOLHDL 3.1 12/29/2018   Lab Results  Component Value Date   HGBA1C 5.6 08/24/2011   Lab Results  Component Value Date   VITAMINB12 367  04/21/2015   Lab Results  Component Value Date   TSH 3.53 11/26/2013      ASSESSMENT AND PLAN 86 y.o. year old female  has a past medical history of Anemia, Angiodysplasia (2007), Anxiety, Chronic low back pain (06/21/2014), Diverticulosis of colon (without mention of hemorrhage), DJD (degenerative joint disease), Esophageal reflux, History of vertebral fracture (11/2015), HTN (hypertension), Hyperlipemia (2006), Hypoglycemia, Pelvic fracture ([place]) (12/30/2011), Peripheral neuropathy, Personal history of colonic polyps, and Vitamin B12 deficiency. here with:  1.  Peripheral neuropathy 2.  Chronic back pain  She will remain on Lyrica 25 mg in the morning, 100 mg at bedtime.  We will stop the nortriptyline, as she is not convinced it is beneficial.  She will take 10 mg every other night for a week, then stop the medication.  We can restart it, if her symptoms worsen.  She will follow-up in 8 months or sooner if needed.  She is unable to tolerate gabapentin or Cymbalta in the past.   I spent 15 minutes with the patient. 50% of this time was spent discussing her plan of care.   Butler Denmark, AGNP-C, DNP 06/20/2019, 3:41 PM [facility] [address], [place], [place] [address] [phone]

## 2019-06-20 NOTE — Patient Instructions (Signed)
Continue taking Lyrica as prescribed  Try stopping the nortriptyline, take it every other night for 1 week, then stop, if you notice more burning in your feet, we can restart it Return in 8 months

## 2019-06-21 NOTE — Progress Notes (Signed)
I have read the note, and I agree with the clinical assessment and plan.  Gianella Chismar K Asuncion Tapscott   

## 2019-06-28 ENCOUNTER — Ambulatory Visit: Payer: Medicare Other | Admitting: Family Medicine

## 2019-07-02 ENCOUNTER — Other Ambulatory Visit: Payer: Self-pay

## 2019-07-02 ENCOUNTER — Ambulatory Visit (INDEPENDENT_AMBULATORY_CARE_PROVIDER_SITE_OTHER): Payer: Medicare Other | Admitting: Family Medicine

## 2019-07-02 ENCOUNTER — Encounter: Payer: Self-pay | Admitting: Family Medicine

## 2019-07-02 VITALS — BP 160/80 | HR 78 | Temp 98.0°F | Resp 18 | Ht 65.0 in | Wt 182.0 lb

## 2019-07-02 DIAGNOSIS — I10 Essential (primary) hypertension: Secondary | ICD-10-CM | POA: Diagnosis not present

## 2019-07-02 DIAGNOSIS — F419 Anxiety disorder, unspecified: Secondary | ICD-10-CM | POA: Diagnosis not present

## 2019-07-02 DIAGNOSIS — Z79899 Other long term (current) drug therapy: Secondary | ICD-10-CM

## 2019-07-02 DIAGNOSIS — E782 Mixed hyperlipidemia: Secondary | ICD-10-CM

## 2019-07-02 MED ORDER — ALPRAZOLAM 0.25 MG PO TABS: ORAL_TABLET | ORAL | 1 refills | Status: DC

## 2019-07-02 NOTE — Assessment & Plan Note (Signed)
Poorly controlled will alter medications, encouraged DASH diet, minimize caffeine and obtain adequate sleep. Report concerning symptoms and follow up as directed and as needed----pt states it is better at home She will check bp at home over next few days and call us with results  con't meds for now

## 2019-07-02 NOTE — Assessment & Plan Note (Signed)
Encouraged heart healthy diet, increase exercise, avoid trans fats, consider a krill oil cap daily 

## 2019-07-02 NOTE — Progress Notes (Signed)
Patient ID: [name], female    DOB: 10/20/1932  Age: 86 y.o. MRN: [id number]    Subjective:  Subjective  HPI KIMMERLY LORA presents for f/u anxiety, bp and chol.    No new complaints She is seeing GI for her stomach and diarrhea   -- diarrhea has resolved  Review of Systems  Constitutional: Negative for appetite change, diaphoresis, fatigue and unexpected weight change.  Eyes: Negative for pain, redness and visual disturbance.  Respiratory: Negative for cough, chest tightness, shortness of breath and wheezing.   Cardiovascular: Negative for chest pain, palpitations and leg swelling.  Endocrine: Negative for cold intolerance, heat intolerance, polydipsia, polyphagia and polyuria.  Genitourinary: Negative for difficulty urinating, dysuria and frequency.  Neurological: Negative for dizziness, light-headedness, numbness and headaches.    History Past Medical History:  Diagnosis Date  . Anemia    hx of   . Angiodysplasia 2007   @ colonoscopy  . Anxiety    PMH of  . Chronic low back pain 06/21/2014  . Diverticulosis of colon (without mention of hemorrhage)   . DJD (degenerative joint disease)   . Esophageal reflux    inactive  . History of vertebral fracture 11/2015  . HTN (hypertension)   . Hyperlipemia 2006   LDL 130  . Hypoglycemia    reactive  . Pelvic fracture ([place]) 12/30/2011   [facility]  . Peripheral neuropathy   . Personal history of colonic polyps    adenomatous  . Vitamin B12 deficiency     She has a past surgical history that includes Tear duct probing; Total hip arthroplasty (2000); Facial cosmetic surgery; Tubal ligation; Hemorroidectomy; Dilation and curettage of uterus; Colonoscopy w/ polypectomy; Lumbar laminectomy/decompression microdiscectomy (09/10/2011); ORIF wrist fracture (01/11/2012); cataract surgery (12-26-12 and 01-09-13); and Colonoscopy (2011).   Her family history includes Cancer in her brother; Depression in her maternal uncle; Heart  attack (age of onset: 32) in her brother; Heart attack (age of onset: 65) in her father; Kidney disease in her mother; Osteoporosis in her sister; Peripheral vascular disease in her daughter; Stroke (age of onset: 73) in her brother; Throat cancer in her sister.She reports that she has never smoked. She has never used smokeless tobacco. She reports current alcohol use of about 3.0 standard drinks of alcohol per week. She reports that she does not use drugs.  Current Outpatient Medications on File Prior to Visit  Medication Sig Dispense Refill  . amLODipine (NORVASC) 5 MG tablet TAKE 1 TABLET BY MOUTH EVERY DAY 90 tablet 1  . Ascorbic Acid (VITAMIN C) 1000 MG tablet Take 1,000 mg by mouth daily.     [name] aspirin 81 MG tablet Take 81 mg by mouth daily.     . Calcium Carbonate (CALCIUM 600 PO) Take 1 capsule by mouth daily.     . Cholecalciferol (VITAMIN D) 2000 UNITS CAPS Take 1 capsule by mouth daily.     . fluticasone (FLONASE) 50 MCG/ACT nasal spray Place 2 sprays into both nostrils daily. 16 g 6  . furosemide (LASIX) 20 MG tablet TAKE 1 TABLET(20 MG) BY MOUTH DAILY 90 tablet 1  . HYDROcodone-acetaminophen (NORCO/VICODIN) 5-325 MG tablet take 1 tablet by mouth twice a day if needed  0  . losartan (COZAAR) 100 MG tablet TAKE 1 TABLET BY MOUTH EVERY DAY 90 tablet 1  . LYRICA 100 MG capsule Take 1 capsule (100 mg total) by mouth at bedtime. 30 capsule 5  . LYRICA 25 MG capsule Take 1 capsule  in the morning 30 capsule 5  . Multiple Vitamin (MULTIVITAMIN WITH MINERALS) TABS Take 1 tablet by mouth daily.    . Omega-3 Fatty Acids (FISH OIL) 1000 MG CAPS Take 1 capsule by mouth daily.     [name] Glycol-Propyl Glycol (SYSTANE ULTRA OP) Apply 1 drop to eye daily as needed (for dry eyes).    . polyethylene glycol (MIRALAX / GLYCOLAX) packet Take 17 g by mouth daily. 10 each 0  . VALTREX 1 g tablet Take 1,000 mg by mouth 2 (two) times daily.     No current facility-administered medications on file prior  to visit.     Objective:  Objective  Physical Exam Vitals and nursing note reviewed.  Constitutional:      Appearance: She is well-developed.  HENT:     Head: Normocephalic and atraumatic.  Eyes:     Conjunctiva/sclera: Conjunctivae normal.  Neck:     Thyroid: No thyromegaly.     Vascular: No carotid bruit or JVD.  Cardiovascular:     Rate and Rhythm: Normal rate and regular rhythm.     Heart sounds: Normal heart sounds. No murmur.  Pulmonary:     Effort: Pulmonary effort is normal. No respiratory distress.     Breath sounds: Normal breath sounds. No wheezing or rales.  Chest:     Chest wall: No tenderness.  Musculoskeletal:     Cervical back: Normal range of motion and neck supple.  Neurological:     Mental Status: She is alert and oriented to person, place, and time.    BP (!) 160/80 (BP Location: Left Arm, Patient Position: Sitting, Cuff Size: Large)   Pulse 78   Temp 98 F (36.7 C) (Temporal)   Resp 18   Ht 5\' 5"  (1.651 m)   Wt 182 lb (82.6 kg)   SpO2 97%   BMI 30.29 kg/m  Wt Readings from Last 3 Encounters:  07/02/19 182 lb (82.6 kg)  06/20/19 184 lb (83.5 kg)  12/29/18 180 lb (81.6 kg)     Lab Results  Component Value Date   WBC 6.6 12/29/2018   HGB 12.7 12/29/2018   HCT 38.2 12/29/2018   PLT 312 12/29/2018   GLUCOSE 89 12/29/2018   CHOL 217 (H) 12/29/2018   TRIG 116 12/29/2018   HDL 69 12/29/2018   LDLDIRECT 129.0 10/18/2014   LDLCALC 126 (H) 12/29/2018   ALT 10 12/29/2018   AST 11 12/29/2018   NA 140 12/29/2018   K 4.9 12/29/2018   CL 103 12/29/2018   CREATININE 1.98 (H) 12/29/2018   BUN 24 12/29/2018   CO2 21 12/29/2018   TSH 3.53 11/26/2013   INR 1.3 ratio (H) 10/29/2009   HGBA1C 5.6 08/24/2011    CT ABDOMEN PELVIS WO CONTRAST  Result Date: 04/10/2019 CLINICAL DATA:  Change in bowel habits. Alternating diarrhea and constipation. EXAM: CT ABDOMEN AND PELVIS WITHOUT CONTRAST TECHNIQUE: Multidetector CT imaging of the abdomen and pelvis  was performed following the standard protocol without IV contrast. COMPARISON:  None. FINDINGS: Lower chest: No acute findings. Hepatobiliary: No mass visualized on this unenhanced exam. Gallbladder is unremarkable. No evidence of biliary ductal dilatation. Pancreas: No mass or inflammatory process visualized on this unenhanced exam. Spleen:  Within normal limits in size. Adrenals/Urinary tract: Moderate bilateral renal atrophy. 3 mm calculus again seen in lower pole of right kidney. No evidence of ureteral calculi or hydronephrosis. Stomach/Bowel: A moderate to large hiatal hernia is seen. No evidence of obstruction, inflammatory process, or  abnormal fluid collections. Normal appendix visualized. Diverticulosis is seen in the distal descending and proximal sigmoid colon, however there is no evidence of diverticulitis. Vascular/Lymphatic: No pathologically enlarged lymph nodes identified. No evidence of abdominal aortic aneurysm. Aortic atherosclerosis incidentally noted. Reproductive:  No mass or other significant abnormality. Other:  None. Musculoskeletal: No suspicious bone lesions identified. Beam hardening artifact from right hip prosthesis partially obscures the lower pelvis. Old bilateral inferior pubic rami fracture deformities are noted. Severe compression fracture deformity of the L1 vertebral body is seen which appears chronic. IMPRESSION: 1. No acute findings within the abdomen or pelvis. 2. Moderate to large hiatal hernia. 3. Colonic diverticulosis, without radiographic evidence of diverticulitis. 4. Tiny right renal calculus. No evidence of ureteral calculi or hydronephrosis. Aortic Atherosclerosis (ICD10-I70.0). Electronically Signed   By: Marlaine Hind M.D.   On: 04/10/2019 18:25     Assessment & Plan:  Plan  I have discontinued Adna Nofziger. [employer] Gersten"'s meclizine, loratadine, and cefUROXime. I am also having her maintain her aspirin, Calcium Carbonate (CALCIUM 600 PO), Fish Oil,  vitamin C, Vitamin D, multivitamin with minerals, polyethylene glycol, Polyethyl Glycol-Propyl Glycol (SYSTANE ULTRA OP), HYDROcodone-acetaminophen, fluticasone, furosemide, Lyrica, amLODipine, losartan, Lyrica, Valtrex, and ALPRAZolam.  Meds ordered this encounter  Medications  . ALPRAZolam (XANAX) 0.25 MG tablet    Sig: take 1 tablet by mouth every 8 to 12 hours if needed    Dispense:  30 tablet    Refill:  1    Problem List Items Addressed This Visit      Unprioritized   Essential hypertension - Primary    Poorly controlled will alter medications, encouraged DASH diet, minimize caffeine and obtain adequate sleep. Report concerning symptoms and follow up as directed and as needed----pt states it is better at home She will check bp at home over next few days and call us with results  con't meds for now      Relevant Orders   Lipid panel   Comprehensive metabolic panel   HYPERLIPIDEMIA    Encouraged heart healthy diet, increase exercise, avoid trans fats, consider a krill oil cap daily       Other Visit Diagnoses    Anxiety disorder       Relevant Medications   ALPRAZolam (XANAX) 0.25 MG tablet   Other Relevant Orders   Pain Mgmt, Profile 8 w/Conf, U   High risk medication use       Relevant Orders   Pain Mgmt, Profile 8 w/Conf, U      Follow-up: Return in about 6 months (around 01/02/2020), or if symptoms worsen or fail to improve, for hypertension, hyperlipidemia.  Ann Held, DO

## 2019-07-02 NOTE — Patient Instructions (Signed)

## 2019-07-03 ENCOUNTER — Telehealth: Payer: Self-pay | Admitting: Neurology

## 2019-07-03 LAB — LIPID PANEL
Cholesterol: 199 mg/dL (ref 0–200)
HDL: 69 mg/dL (ref >39.00)
LDL Cholesterol: 111 mg/dL — ABNORMAL HIGH (ref 0–99)
NonHDL: 129.53
Total CHOL/HDL Ratio: 3
Triglycerides: 92 mg/dL (ref 0.0–149.0)
VLDL: 18.4 mg/dL (ref 0.0–40.0)

## 2019-07-03 LAB — COMPREHENSIVE METABOLIC PANEL
ALT: 11 U/L (ref 0–35)
AST: 11 U/L (ref 0–37)
Albumin: 3.9 g/dL (ref 3.5–5.2)
Alkaline Phosphatase: 53 U/L (ref 39–117)
BUN: 35 mg/dL — ABNORMAL HIGH (ref 6–23)
CO2: 26 mEq/L (ref 19–32)
Calcium: 9.4 mg/dL (ref 8.4–10.5)
Chloride: 104 mEq/L (ref 96–112)
Creatinine, Ser: 2.03 mg/dL — ABNORMAL HIGH (ref 0.40–1.20)
GFR: 23.27 mL/min — ABNORMAL LOW (ref >60.00)
Glucose, Bld: 89 mg/dL (ref 70–99)
Potassium: 4.4 mEq/L (ref 3.5–5.1)
Sodium: 137 mEq/L (ref 135–145)
Total Bilirubin: 0.4 mg/dL (ref 0.2–1.2)
Total Protein: 6.5 g/dL (ref 6.0–8.3)

## 2019-07-03 MED ORDER — NORTRIPTYLINE HCL 10 MG PO CAPS: 10.0000 mg | ORAL_CAPSULE | Freq: Every day | ORAL | 3 refills | Status: DC

## 2019-07-03 NOTE — Telephone Encounter (Signed)
Patient called in regards to nortriptyline medication states her legs are burning and she would like to continue taking medication.

## 2019-07-03 NOTE — Telephone Encounter (Signed)
Noted, she is going back on nortriptyline.

## 2019-07-03 NOTE — Telephone Encounter (Signed)
Pt will be going back on the nortriptyline.

## 2019-07-04 LAB — PAIN MGMT, PROFILE 8 W/CONF, U
6 Acetylmorphine: NEGATIVE ng/mL
Alcohol Metabolites: POSITIVE ng/mL — AB (ref <500)
Amphetamines: NEGATIVE ng/mL
Benzodiazepines: NEGATIVE ng/mL
Buprenorphine, Urine: NEGATIVE ng/mL
Cocaine Metabolite: NEGATIVE ng/mL
Codeine: NEGATIVE ng/mL
Creatinine: 74.4 mg/dL
Ethyl Glucuronide (ETG): 11180 ng/mL
Ethyl Sulfate (ETS): 2634 ng/mL
Hydrocodone: NEGATIVE ng/mL
Hydromorphone: 104 ng/mL
MDMA: NEGATIVE ng/mL
Marijuana Metabolite: NEGATIVE ng/mL
Morphine: NEGATIVE ng/mL
Norhydrocodone: NEGATIVE ng/mL
Opiates: POSITIVE ng/mL
Oxidant: NEGATIVE ug/mL
Oxycodone: NEGATIVE ng/mL
pH: 5.8 (ref 4.5–9.0)

## 2019-07-08 ENCOUNTER — Encounter: Payer: Self-pay | Admitting: Family Medicine

## 2019-07-09 NOTE — Telephone Encounter (Signed)
Good--- con't meds for now---- f/u 6 months

## 2019-07-16 DIAGNOSIS — I5030 Unspecified diastolic (congestive) heart failure: Secondary | ICD-10-CM | POA: Diagnosis not present

## 2019-07-16 DIAGNOSIS — N189 Chronic kidney disease, unspecified: Secondary | ICD-10-CM | POA: Diagnosis not present

## 2019-07-16 DIAGNOSIS — N2581 Secondary hyperparathyroidism of renal origin: Secondary | ICD-10-CM | POA: Diagnosis not present

## 2019-07-16 DIAGNOSIS — N184 Chronic kidney disease, stage 4 (severe): Secondary | ICD-10-CM | POA: Diagnosis not present

## 2019-07-16 DIAGNOSIS — E785 Hyperlipidemia, unspecified: Secondary | ICD-10-CM | POA: Diagnosis not present

## 2019-08-03 ENCOUNTER — Other Ambulatory Visit: Payer: Self-pay

## 2019-08-03 DIAGNOSIS — I10 Essential (primary) hypertension: Secondary | ICD-10-CM

## 2019-08-03 MED ORDER — AMLODIPINE BESYLATE 5 MG PO TABS: 5.0000 mg | ORAL_TABLET | Freq: Every day | ORAL | 1 refills | Status: DC

## 2019-08-09 DIAGNOSIS — Z79899 Other long term (current) drug therapy: Secondary | ICD-10-CM | POA: Diagnosis not present

## 2019-08-09 DIAGNOSIS — G894 Chronic pain syndrome: Secondary | ICD-10-CM | POA: Diagnosis not present

## 2019-08-09 DIAGNOSIS — Z5181 Encounter for therapeutic drug level monitoring: Secondary | ICD-10-CM | POA: Diagnosis not present

## 2019-08-10 ENCOUNTER — Other Ambulatory Visit: Payer: Self-pay

## 2019-08-10 DIAGNOSIS — I10 Essential (primary) hypertension: Secondary | ICD-10-CM

## 2019-08-10 DIAGNOSIS — R600 Localized edema: Secondary | ICD-10-CM

## 2019-08-10 MED ORDER — FUROSEMIDE 20 MG PO TABS: ORAL_TABLET | ORAL | 1 refills | Status: DC

## 2019-08-10 NOTE — Telephone Encounter (Signed)
Received fax from pharmacy requesting refill on Furosemide 20 mg tablets.

## 2019-08-21 ENCOUNTER — Telehealth: Payer: Self-pay | Admitting: Family Medicine

## 2019-08-21 NOTE — Progress Notes (Signed)
°  Chronic Care Management   Outreach Note  08/21/2019 Name: Krystal Henderson MRN: [id number] DOB: 10-06-1932  Referred by: Ann Held, DO Reason for referral : No chief complaint on file.   An unsuccessful telephone outreach was attempted today. The patient was referred to the pharmacist for assistance with care management and care coordination.   This note is not being shared with the patient for the following reason: To respect privacy (The patient or proxy has requested that the information not be shared).  Follow Up Plan:   Earney Hamburg Upstream Scheduler

## 2019-09-11 ENCOUNTER — Telehealth: Payer: Self-pay | Admitting: Family Medicine

## 2019-09-11 ENCOUNTER — Other Ambulatory Visit: Payer: Self-pay | Admitting: Neurology

## 2019-09-11 NOTE — Telephone Encounter (Signed)
Pt is asking that her LYRICA 25 MG capsule to [id number]  Be called in

## 2019-09-11 NOTE — Progress Notes (Signed)
  Chronic Care Management   Outreach Note  09/11/2019 Name: Krystal Henderson MRN: [id number] DOB: February 20, 1933  Referred by: Ann Held, DO Reason for referral : No chief complaint on file.   An unsuccessful telephone outreach was attempted today. The patient was referred to the pharmacist for assistance with care management and care coordination.   This note is not being shared with the patient for the following reason: To respect privacy (The patient or proxy has requested that the information not be shared).  Follow Up Plan:   Earney Hamburg Upstream Scheduler

## 2019-09-12 ENCOUNTER — Telehealth: Payer: Self-pay | Admitting: Family Medicine

## 2019-09-12 MED ORDER — LYRICA 100 MG PO CAPS: 100.0000 mg | ORAL_CAPSULE | Freq: Every day | ORAL | 5 refills | Status: DC

## 2019-09-12 NOTE — Telephone Encounter (Signed)
Pt called back and is needed 100mg  caps of lyrica.

## 2019-09-12 NOTE — Addendum Note (Signed)
Addended by: Oliver Hum S on: 09/12/2019 10:09 AM   Modules accepted: Orders

## 2019-09-12 NOTE — Telephone Encounter (Signed)
I LMVM for pt that questioning the mg cap needed.  Looking at record shows refill available for 25mg  caps, needs 100mg  ??

## 2019-09-12 NOTE — Progress Notes (Signed)
  Chronic Care Management   Note  09/12/2019 Name: Krystal Henderson MRN: [id number] DOB: February 21, 1933  Krystal Henderson is a 86 y.o. year old female who is a primary care patient of Ann Held, DO. I reached out to [name] by phone today in response to a referral sent by [name] PCP, Carollee Herter, Alferd Apa, DO.   [name] was given information about Chronic Care Management services today including:  1. CCM service includes personalized support from designated clinical staff supervised by her physician, including individualized plan of care and coordination with other care providers 2. 24/7 contact phone numbers for assistance for urgent and routine care needs. 3. Service will only be billed when office clinical staff spend 20 minutes or more in a month to coordinate care. 4. Only one practitioner may furnish and bill the service in a calendar month. 5. The patient may stop CCM services at any time (effective at the end of the month) by phone call to the office staff.   Patient agreed to services and verbal consent obtained.   This note is not being shared with the patient for the following reason: To respect privacy (The patient or proxy has requested that the information not be shared).  Follow up plan:   Earney Hamburg Upstream Scheduler

## 2019-10-01 DIAGNOSIS — H35342 Macular cyst, hole, or pseudohole, left eye: Secondary | ICD-10-CM | POA: Diagnosis not present

## 2019-10-01 DIAGNOSIS — H43813 Vitreous degeneration, bilateral: Secondary | ICD-10-CM | POA: Diagnosis not present

## 2019-10-01 DIAGNOSIS — H52203 Unspecified astigmatism, bilateral: Secondary | ICD-10-CM | POA: Diagnosis not present

## 2019-10-01 DIAGNOSIS — H53001 Unspecified amblyopia, right eye: Secondary | ICD-10-CM | POA: Diagnosis not present

## 2019-10-25 ENCOUNTER — Other Ambulatory Visit: Payer: Self-pay | Admitting: *Deleted

## 2019-10-25 MED ORDER — NORTRIPTYLINE HCL 10 MG PO CAPS: 10.0000 mg | ORAL_CAPSULE | Freq: Every day | ORAL | 3 refills | Status: DC

## 2019-10-31 ENCOUNTER — Other Ambulatory Visit: Payer: Self-pay | Admitting: Family Medicine

## 2019-10-31 DIAGNOSIS — I10 Essential (primary) hypertension: Secondary | ICD-10-CM

## 2019-11-08 ENCOUNTER — Emergency Department (HOSPITAL_COMMUNITY): Payer: Medicare Other

## 2019-11-08 ENCOUNTER — Inpatient Hospital Stay (HOSPITAL_COMMUNITY)
Admission: EM | Admit: 2019-11-08 | Discharge: 2019-11-12 | DRG: 175 | Disposition: A | Discharge status: Home Health | Payer: Medicare Other | Attending: Internal Medicine | Admitting: Internal Medicine

## 2019-11-08 ENCOUNTER — Other Ambulatory Visit: Payer: Self-pay

## 2019-11-08 ENCOUNTER — Encounter (HOSPITAL_COMMUNITY): Payer: Self-pay

## 2019-11-08 DIAGNOSIS — Z789 Other specified health status: Secondary | ICD-10-CM | POA: Diagnosis not present

## 2019-11-08 DIAGNOSIS — I2699 Other pulmonary embolism without acute cor pulmonale: Secondary | ICD-10-CM | POA: Diagnosis not present

## 2019-11-08 DIAGNOSIS — I959 Hypotension, unspecified: Secondary | ICD-10-CM | POA: Diagnosis not present

## 2019-11-08 DIAGNOSIS — Z79899 Other long term (current) drug therapy: Secondary | ICD-10-CM

## 2019-11-08 DIAGNOSIS — Z8781 Personal history of (healed) traumatic fracture: Secondary | ICD-10-CM

## 2019-11-08 DIAGNOSIS — Z801 Family history of malignant neoplasm of trachea, bronchus and lung: Secondary | ICD-10-CM

## 2019-11-08 DIAGNOSIS — Z7409 Other reduced mobility: Secondary | ICD-10-CM

## 2019-11-08 DIAGNOSIS — N184 Chronic kidney disease, stage 4 (severe): Secondary | ICD-10-CM | POA: Diagnosis present

## 2019-11-08 DIAGNOSIS — Z7982 Long term (current) use of aspirin: Secondary | ICD-10-CM

## 2019-11-08 DIAGNOSIS — M545 Low back pain: Secondary | ICD-10-CM | POA: Diagnosis not present

## 2019-11-08 DIAGNOSIS — G8929 Other chronic pain: Secondary | ICD-10-CM | POA: Diagnosis present

## 2019-11-08 DIAGNOSIS — Z8719 Personal history of other diseases of the digestive system: Secondary | ICD-10-CM

## 2019-11-08 DIAGNOSIS — G629 Polyneuropathy, unspecified: Secondary | ICD-10-CM | POA: Diagnosis present

## 2019-11-08 DIAGNOSIS — R0602 Shortness of breath: Secondary | ICD-10-CM | POA: Diagnosis not present

## 2019-11-08 DIAGNOSIS — Z888 Allergy status to other drugs, medicaments and biological substances status: Secondary | ICD-10-CM

## 2019-11-08 DIAGNOSIS — I10 Essential (primary) hypertension: Secondary | ICD-10-CM | POA: Diagnosis not present

## 2019-11-08 DIAGNOSIS — R269 Unspecified abnormalities of gait and mobility: Secondary | ICD-10-CM | POA: Diagnosis present

## 2019-11-08 DIAGNOSIS — Z808 Family history of malignant neoplasm of other organs or systems: Secondary | ICD-10-CM | POA: Diagnosis not present

## 2019-11-08 DIAGNOSIS — Z8249 Family history of ischemic heart disease and other diseases of the circulatory system: Secondary | ICD-10-CM | POA: Diagnosis not present

## 2019-11-08 DIAGNOSIS — Z841 Family history of disorders of kidney and ureter: Secondary | ICD-10-CM

## 2019-11-08 DIAGNOSIS — E559 Vitamin D deficiency, unspecified: Secondary | ICD-10-CM | POA: Diagnosis present

## 2019-11-08 DIAGNOSIS — I480 Paroxysmal atrial fibrillation: Secondary | ICD-10-CM | POA: Diagnosis present

## 2019-11-08 DIAGNOSIS — Z20822 Contact with and (suspected) exposure to covid-19: Secondary | ICD-10-CM | POA: Diagnosis present

## 2019-11-08 DIAGNOSIS — I5043 Acute on chronic combined systolic (congestive) and diastolic (congestive) heart failure: Secondary | ICD-10-CM | POA: Diagnosis present

## 2019-11-08 DIAGNOSIS — R531 Weakness: Secondary | ICD-10-CM | POA: Diagnosis not present

## 2019-11-08 DIAGNOSIS — J9600 Acute respiratory failure, unspecified whether with hypoxia or hypercapnia: Secondary | ICD-10-CM | POA: Diagnosis not present

## 2019-11-08 DIAGNOSIS — J189 Pneumonia, unspecified organism: Secondary | ICD-10-CM | POA: Diagnosis not present

## 2019-11-08 DIAGNOSIS — Z823 Family history of stroke: Secondary | ICD-10-CM

## 2019-11-08 DIAGNOSIS — I130 Hypertensive heart and chronic kidney disease with heart failure and stage 1 through stage 4 chronic kidney disease, or unspecified chronic kidney disease: Secondary | ICD-10-CM | POA: Diagnosis not present

## 2019-11-08 DIAGNOSIS — Z96641 Presence of right artificial hip joint: Secondary | ICD-10-CM | POA: Diagnosis present

## 2019-11-08 DIAGNOSIS — J9601 Acute respiratory failure with hypoxia: Secondary | ICD-10-CM | POA: Diagnosis present

## 2019-11-08 DIAGNOSIS — I517 Cardiomegaly: Secondary | ICD-10-CM | POA: Diagnosis not present

## 2019-11-08 DIAGNOSIS — I2693 Single subsegmental pulmonary embolism without acute cor pulmonale: Secondary | ICD-10-CM

## 2019-11-08 DIAGNOSIS — R079 Chest pain, unspecified: Secondary | ICD-10-CM | POA: Diagnosis not present

## 2019-11-08 DIAGNOSIS — K449 Diaphragmatic hernia without obstruction or gangrene: Secondary | ICD-10-CM | POA: Diagnosis not present

## 2019-11-08 DIAGNOSIS — Z8262 Family history of osteoporosis: Secondary | ICD-10-CM

## 2019-11-08 DIAGNOSIS — J811 Chronic pulmonary edema: Secondary | ICD-10-CM | POA: Diagnosis not present

## 2019-11-08 DIAGNOSIS — Z885 Allergy status to narcotic agent status: Secondary | ICD-10-CM | POA: Diagnosis not present

## 2019-11-08 DIAGNOSIS — G609 Hereditary and idiopathic neuropathy, unspecified: Secondary | ICD-10-CM | POA: Diagnosis present

## 2019-11-08 DIAGNOSIS — F419 Anxiety disorder, unspecified: Secondary | ICD-10-CM | POA: Diagnosis present

## 2019-11-08 DIAGNOSIS — I2602 Saddle embolus of pulmonary artery with acute cor pulmonale: Secondary | ICD-10-CM | POA: Diagnosis not present

## 2019-11-08 LAB — BASIC METABOLIC PANEL
Anion gap: 9 (ref 5–15)
BUN: 32 mg/dL — ABNORMAL HIGH (ref 8–23)
CO2: 26 mmol/L (ref 22–32)
Calcium: 9.3 mg/dL (ref 8.9–10.3)
Chloride: 103 mmol/L (ref 98–111)
Creatinine, Ser: 2.21 mg/dL — ABNORMAL HIGH (ref 0.44–1.00)
GFR calc Af Amer: 23 mL/min — ABNORMAL LOW (ref >60)
GFR calc non Af Amer: 20 mL/min — ABNORMAL LOW (ref >60)
Glucose, Bld: 94 mg/dL (ref 70–99)
Potassium: 4.8 mmol/L (ref 3.5–5.1)
Sodium: 138 mmol/L (ref 135–145)

## 2019-11-08 LAB — CBC
HCT: 41.1 % (ref 36.0–46.0)
Hemoglobin: 13.1 g/dL (ref 12.0–15.0)
MCH: 30.8 pg (ref 26.0–34.0)
MCHC: 31.9 g/dL (ref 30.0–36.0)
MCV: 96.5 fL (ref 80.0–100.0)
Platelets: 323 10*3/uL (ref 150–400)
RBC: 4.26 MIL/uL (ref 3.87–5.11)
RDW: 15.4 % (ref 11.5–15.5)
WBC: 11.5 10*3/uL — ABNORMAL HIGH (ref 4.0–10.5)
nRBC: 0 % (ref 0.0–0.2)

## 2019-11-08 LAB — SARS CORONAVIRUS 2 BY RT PCR (HOSPITAL ORDER, PERFORMED IN ~~LOC~~ HOSPITAL LAB): SARS Coronavirus 2: NEGATIVE

## 2019-11-08 LAB — D-DIMER, QUANTITATIVE: D-Dimer, Quant: 1.18 ug/mL-FEU — ABNORMAL HIGH (ref 0.00–0.50)

## 2019-11-08 LAB — APTT: aPTT: 36 seconds (ref 24–36)

## 2019-11-08 LAB — TROPONIN I (HIGH SENSITIVITY)
Troponin I (High Sensitivity): 8 ng/L (ref <18)
Troponin I (High Sensitivity): 9 ng/L (ref <18)

## 2019-11-08 LAB — PROTIME-INR
INR: 1.4 — ABNORMAL HIGH (ref 0.8–1.2)
Prothrombin Time: 16.5 seconds — ABNORMAL HIGH (ref 11.4–15.2)

## 2019-11-08 LAB — BRAIN NATRIURETIC PEPTIDE: B Natriuretic Peptide: 267.1 pg/mL — ABNORMAL HIGH (ref 0.0–100.0)

## 2019-11-08 LAB — LACTIC ACID, PLASMA: Lactic Acid, Venous: 1.3 mmol/L (ref 0.5–1.9)

## 2019-11-08 MED ORDER — ONDANSETRON HCL 4 MG/2ML IJ SOLN: 4.0000 mg | Freq: Four times a day (QID) | INTRAMUSCULAR | Status: DC | PRN

## 2019-11-08 MED ORDER — ONDANSETRON HCL 4 MG PO TABS: 4.0000 mg | ORAL_TABLET | Freq: Four times a day (QID) | ORAL | Status: DC | PRN

## 2019-11-08 MED ORDER — PREGABALIN 50 MG PO CAPS: 100.0000 mg | ORAL_CAPSULE | Freq: Every day | ORAL | Status: DC

## 2019-11-08 MED ORDER — HEPARIN BOLUS VIA INFUSION
2500.0000 [IU] | Freq: Once | INTRAVENOUS | Status: AC
  Administered 2019-11-08: 2500 [IU] via INTRAVENOUS
  Filled 2019-11-08: qty 2500

## 2019-11-08 MED ORDER — PREGABALIN 75 MG PO CAPS
125.0000 mg | ORAL_CAPSULE | Freq: Every day | ORAL | Status: DC
  Administered 2019-11-09 – 2019-11-11 (×4): 125 mg via ORAL
  Filled 2019-11-08 (×4): qty 1

## 2019-11-08 MED ORDER — ASPIRIN EC 81 MG PO TBEC
81.0000 mg | DELAYED_RELEASE_TABLET | Freq: Every evening | ORAL | Status: DC
  Administered 2019-11-09 – 2019-11-11 (×3): 81 mg via ORAL
  Filled 2019-11-08 (×3): qty 1

## 2019-11-08 MED ORDER — HEPARIN (PORCINE) 25000 UT/250ML-% IV SOLN
1000.0000 [IU]/h | INTRAVENOUS | Status: DC
  Administered 2019-11-08: 1100 [IU]/h via INTRAVENOUS
  Administered 2019-11-09: 1000 [IU]/h via INTRAVENOUS
  Filled 2019-11-08 (×2): qty 250

## 2019-11-08 MED ORDER — ACETAMINOPHEN 325 MG PO TABS
650.0000 mg | ORAL_TABLET | Freq: Once | ORAL | Status: AC | PRN
  Administered 2019-11-08: 650 mg via ORAL
  Filled 2019-11-08: qty 2

## 2019-11-08 MED ORDER — ALPRAZOLAM 0.25 MG PO TABS
0.2500 mg | ORAL_TABLET | Freq: Three times a day (TID) | ORAL | Status: DC | PRN
  Administered 2019-11-09: 0.25 mg via ORAL
  Filled 2019-11-08: qty 1

## 2019-11-08 MED ORDER — HYDROCODONE-ACETAMINOPHEN 5-325 MG PO TABS
1.0000 | ORAL_TABLET | Freq: Four times a day (QID) | ORAL | Status: DC | PRN
  Administered 2019-11-10: 1 via ORAL
  Filled 2019-11-08: qty 1

## 2019-11-08 MED ORDER — ASCORBIC ACID 500 MG PO TABS
1000.0000 mg | ORAL_TABLET | Freq: Every day | ORAL | Status: DC
  Administered 2019-11-09 – 2019-11-12 (×4): 1000 mg via ORAL
  Filled 2019-11-08 (×4): qty 2

## 2019-11-08 MED ORDER — TECHNETIUM TO 99M ALBUMIN AGGREGATED
4.2000 | Freq: Once | INTRAVENOUS | Status: AC | PRN
  Administered 2019-11-08: 4.2 via INTRAVENOUS

## 2019-11-08 MED ORDER — PREGABALIN 25 MG PO CAPS: 25.0000 mg | ORAL_CAPSULE | Freq: Every day | ORAL | Status: DC

## 2019-11-08 MED ORDER — POLYETHYLENE GLYCOL 3350 17 G PO PACK
17.0000 g | PACK | Freq: Every day | ORAL | Status: DC
  Administered 2019-11-09 – 2019-11-11 (×3): 17 g via ORAL
  Filled 2019-11-08 (×4): qty 1

## 2019-11-08 MED ORDER — NORTRIPTYLINE HCL 10 MG PO CAPS
10.0000 mg | ORAL_CAPSULE | Freq: Every day | ORAL | Status: DC
  Administered 2019-11-09 – 2019-11-11 (×4): 10 mg via ORAL
  Filled 2019-11-08 (×5): qty 1

## 2019-11-08 NOTE — H&P (Signed)
History and Physical    Krystal Henderson SNK:[id number] DOB: 12-17-1932 DOA: 11/08/2019  PCP: Ann Held, DO  Patient coming from: Home.  Chief Complaint: Chest pain and shortness of breath.  HPI: Krystal Henderson is a 86 y.o. female with history of chronic kidney disease stage IV, hypertension, neuropathy presents to the ER because of chest pain.  Patient states that she woke up this morning with chest pain moving across the chest.  Was present even at rest.  Over the last few days patient states she has been experiencing exertional shortness of breath.  Denies any productive cough fever chills.  No swelling of the lower extremity.  In addition patient has been stating that she has been examined weakness over the last few days.  Not lose consciousness or did not have any fall.  ED Course: [name] was hypoxic requiring 2 L oxygen to maintain sats with chest x-ray showing venous congestion versus pneumonia.  D-dimer was mildly elevated around 1.1 and nuclear scan was done since CT angiogram cannot be done due to her renal function.  Nuclear scan done shows possibility of a filling defect concerning for pulmonary embolism and patient was started on heparin.  EKG shows low voltage.  Otherwise unremarkable.  Patient's labs are significant for creatinine around 0.2 WBC 11.4 high-sensitivity troponin was negative BNP 267.1 D-dimer 1.1.  Review of Systems: As per HPI, rest all negative.   Past Medical History:  Diagnosis Date  . Anemia    hx of   . Angiodysplasia 2007   @ colonoscopy  . Anxiety    PMH of  . Chronic low back pain 06/21/2014  . Diverticulosis of colon (without mention of hemorrhage)   . DJD (degenerative joint disease)   . Esophageal reflux    inactive  . History of vertebral fracture 11/2015  . HTN (hypertension)   . Hyperlipemia 2006   LDL 130  . Hypoglycemia    reactive  . Pelvic fracture ([facility]) 12/30/2011   [facility]  . Peripheral neuropathy   .  Personal history of colonic polyps    adenomatous  . Vitamin B12 deficiency     Past Surgical History:  Procedure Laterality Date  . cataract surgery  12-26-12 and 01-09-13  . COLONOSCOPY  2011   neg  . COLONOSCOPY W/ POLYPECTOMY     X 2 , Dr  Verl Blalock; angiodysplasia. Due 2022  . DILATION AND CURETTAGE OF UTERUS    . FACIAL COSMETIC SURGERY    . HEMORROIDECTOMY    . LUMBAR LAMINECTOMY/DECOMPRESSION MICRODISCECTOMY  09/10/2011   Procedure: LUMBAR LAMINECTOMY/DECOMPRESSION MICRODISCECTOMY;  Surgeon: Johnn Hai, MD;  Location: [facility];  Service: Orthopedics;  Laterality: N/A;  decompression laminectomy L2-3, L3-4, L4-5  . ORIF WRIST FRACTURE  01/11/2012   Procedure: OPEN REDUCTION INTERNAL FIXATION (ORIF) WRIST FRACTURE;  Surgeon: Tennis Must, MD;  Location: [facility];  Service: Orthopedics;  Laterality: Right;  . TEAR DUCT PROBING     X 2  . TOTAL HIP ARTHROPLASTY  2000   right  . TUBAL LIGATION       reports that she has never smoked. She has never used smokeless tobacco. She reports current alcohol use of about 3.0 standard drinks of alcohol per week. She reports that she does not use drugs.  Allergies  Allergen Reactions  . Codeine Nausea Only  . Duloxetine Other (See Comments)    REACTION: intolerance--She does not remember taking this Rx- states  she did not feel well   . Sertraline Hcl Other (See Comments)    REACTION: intolerance-- Did not help with Depression    Family History  Problem Relation Age of Onset  . Heart attack Father 69  . Kidney disease Mother        renal failure  . Throat cancer Sister        smoker  . Osteoporosis Sister   . Heart attack Brother 71  . Stroke Brother 51       smoker  . Depression Maternal Uncle   . Cancer Brother        X3  lung cancer, all smokers  . Peripheral vascular disease Daughter   . Colon cancer Neg Hx   . Diabetes Neg Hx     Prior to Admission medications   Medication Sig Start Date End  Date Taking? Authorizing Provider  ALPRAZolam Duanne Moron) 0.25 MG tablet take 1 tablet by mouth every 8 to 12 hours if needed Patient taking differently: Take 0.25 mg by mouth 3 (three) times daily as needed for anxiety.  07/02/19  Yes [name] R, DO  amLODipine (NORVASC) 5 MG tablet Take 1 tablet (5 mg total) by mouth daily. 08/03/19  Yes Ann Held, DO  Ascorbic Acid (VITAMIN C) 1000 MG tablet Take 1,000 mg by mouth daily.    Yes [provider]  aspirin 81 MG tablet Take 81 mg by mouth every evening.    Yes [provider]  Calcium Carbonate (CALCIUM 600 PO) Take 1 capsule by mouth daily.    Yes [provider]  Cholecalciferol (VITAMIN D) 2000 UNITS CAPS Take 1 capsule by mouth daily.    Yes [provider]  furosemide (LASIX) 20 MG tablet TAKE 1 TABLET(20 MG) BY MOUTH DAILY 08/10/19  Yes Ann Held, DO  HYDROcodone-acetaminophen (NORCO/VICODIN) 5-325 MG tablet Take 1 tablet by mouth every 6 (six) hours as needed for moderate pain or severe pain.  06/18/16  Yes [provider]  losartan (COZAAR) 100 MG tablet TAKE 1 TABLET BY MOUTH EVERY DAY 11/01/19  Yes Lowne Chase, Yvonne R, DO  LYRICA 100 MG capsule Take 1 capsule (100 mg total) by mouth at bedtime. Patient taking differently: Take 100 mg by mouth at bedtime. Take along with 25 mg capsule=125 mg 09/12/19  Yes Suzzanne Cloud, NP  LYRICA 25 MG capsule Take 1 capsule in the morning Patient taking differently: Take 25 mg by mouth at bedtime. Take along with 100 mg capsule=125 mg 06/12/19  Yes Suzzanne Cloud, NP  nortriptyline (PAMELOR) 10 MG capsule Take 1 capsule (10 mg total) by mouth at bedtime. 10/25/19  Yes Suzzanne Cloud, NP  Polyethyl Glycol-Propyl Glycol (SYSTANE ULTRA OP) Apply 1 drop to eye daily as needed (for dry eyes).   Yes [provider]  polyethylene glycol (MIRALAX / GLYCOLAX) packet Take 17 g by mouth daily. 01/03/12  Yes Dhungel, Nishant, MD  VALTREX 1 g  tablet Take 1,000 mg by mouth 2 (two) times daily. 01/03/19  Yes [provider]  fluticasone (FLONASE) 50 MCG/ACT nasal spray Place 2 sprays into both nostrils daily. Patient not taking: Reported on 11/08/2019 12/22/17   Ann Held, DO    Physical Exam: Constitutional: Moderately built and nourished. Vitals:   11/08/19 2113 11/08/19 2132 11/08/19 2211 11/08/19 2250  BP:  (!) 90/43 (!) 103/43 (!) 105/51  Pulse:  69 65 61  Resp:  15 16 17  Temp:      TempSrc:      SpO2:  93% 94% 92%  Weight: 83.9 kg     Height: 5\' 5"  (1.651 m)      Eyes: Anicteric no pallor. ENMT: No discharge from the ears eyes nose or mouth. Neck: No mass felt.  No JVD appreciated. Respiratory: No rhonchi or crepitations. Cardiovascular: S1-S2 heard. Abdomen: Soft nontender bowel sounds present. Musculoskeletal: No edema. Skin: No rash. Neurologic: Alert awake oriented to time place and person.  Moves all extremities. Psychiatric: Appears normal.  Normal affect.   Labs on Admission: I have personally reviewed following labs and imaging studies  CBC: Recent Labs  Lab 11/08/19 1240  WBC 11.5*  HGB 13.1  HCT 41.1  MCV 96.5  PLT 093   Basic Metabolic Panel: Recent Labs  Lab 11/08/19 1240  NA 138  K 4.8  CL 103  CO2 26  GLUCOSE 94  BUN 32*  CREATININE 2.21*  CALCIUM 9.3   GFR: Estimated Creatinine Clearance: 19.9 mL/min (A) (by C-G formula based on SCr of 2.21 mg/dL (H)). Liver Function Tests: No results for input(s): AST, ALT, ALKPHOS, BILITOT, PROT, ALBUMIN in the last 168 hours. No results for input(s): LIPASE, AMYLASE in the last 168 hours. No results for input(s): AMMONIA in the last 168 hours. Coagulation Profile: Recent Labs  Lab 11/08/19 2100  INR 1.4*   Cardiac Enzymes: No results for input(s): CKTOTAL, CKMB, CKMBINDEX, TROPONINI in the last 168 hours. BNP (last 3 results) No results for input(s): PROBNP in the last 8760 hours. HbA1C: No results for  input(s): HGBA1C in the last 72 hours. CBG: No results for input(s): GLUCAP in the last 168 hours. Lipid Profile: No results for input(s): CHOL, HDL, LDLCALC, TRIG, CHOLHDL, LDLDIRECT in the last 72 hours. Thyroid Function Tests: No results for input(s): TSH, T4TOTAL, FREET4, T3FREE, THYROIDAB in the last 72 hours. Anemia Panel: No results for input(s): VITAMINB12, FOLATE, FERRITIN, TIBC, IRON, RETICCTPCT in the last 72 hours. Urine analysis:    Component Value Date/Time   COLORURINE YELLOW 11/04/2015 0951   APPEARANCEUR CLEAR 11/04/2015 0951   LABSPEC 1.008 11/04/2015 0951   PHURINE 7.0 11/04/2015 0951   GLUCOSEU NEGATIVE 11/04/2015 0951   HGBUR NEGATIVE 11/04/2015 0951   HGBUR moderate 03/06/2009 0824   BILIRUBINUR neg 12/22/2017 1556   KETONESUR NEGATIVE 11/04/2015 0951   PROTEINUR Negative 12/22/2017 1556   PROTEINUR NEGATIVE 11/04/2015 0951   UROBILINOGEN 0.2 12/22/2017 1556   UROBILINOGEN 0.2 01/01/2012 1633   NITRITE neg 12/22/2017 1556   NITRITE NEGATIVE 11/04/2015 0951   LEUKOCYTESUR Negative 12/22/2017 1556   Sepsis Labs: @LABRCNTIP (procalcitonin:4,lacticidven:4) )No results found for this or any previous visit (from the past 240 hour(s)).   Radiological Exams on Admission: DG Chest 2 View  Result Date: 11/08/2019 CLINICAL DATA:  Chest pain. EXAM: CHEST - 2 VIEW COMPARISON:  07/22/2015. FINDINGS: Cardiomegaly with mild pulmonary venous congestion. Right upper lobe infiltrate/edema. No pleural effusion or pneumothorax. Sliding hiatal hernia. No acute bony abnormality. IMPRESSION: 1.  Cardiomegaly with pulmonary venous congestion. 2. Right upper lobe infiltrate/edema. Right upper lobe pneumonia cannot be excluded. 3.  Sliding hiatal hernia. Electronically Signed   By: Marcello Moores  Register   On: 11/08/2019 13:44   NM Pulmonary Perfusion  Result Date: 11/08/2019 CLINICAL DATA:  Shortness of breath and elevated D-dimer. EXAM: NUCLEAR MEDICINE PERFUSION LUNG SCAN TECHNIQUE:  Perfusion images were obtained in multiple projections after intravenous injection of radiopharmaceutical. Ventilation scans intentionally deferred if perfusion scan and chest  x-ray adequate for interpretation during COVID 19 epidemic. RADIOPHARMACEUTICALS:  4.2 mCi Tc-97m MAA IV COMPARISON:  Chest plain film, dated November 08, 2019, is available for comparison. FINDINGS: A small area of nonsegmental decreased perfusion is seen along the posterior aspect of the upper left lung. This is best seen on the posterior and left lateral views. No corresponding abnormalities are seen within this region on the recent chest plain film. Homogeneous distribution of tracer uptake is noted throughout the remainder of both lungs. IMPRESSION: 1. Small area of decreased perfusion seen along the posterior aspect of the left upper lobe. Sequelae associated with a small pulmonary embolism cannot be excluded. Further evaluation with chest CTA is recommended. Electronically Signed   By: Virgina Norfolk M.D.   On: 11/08/2019 19:55    EKG: Independently reviewed.  Normal sinus rhythm low voltage.  Assessment/Plan Principal Problem:   Acute respiratory failure (HCC) Active Problems:   Essential hypertension   Angiodysplasia of intestine with hemorrhage   Peripheral neuropathy, idiopathic   Chronic renal insufficiency, stage III (moderate)   Pulmonary embolism ([place])    1. Acute respiratory failure with hypoxia/exertional dyspnea with chest pain with VQ scan showing filling defect concerning for pulmonary volume likely causing patient's symptoms.  Patient pulmonary embolism likely unprovoked.  Patient has been started on heparin.  Blood pressures in the low normal we will hold of antihypertensive.  Will trend cardiac markers check lactic acid levels and check Dopplers of the lower extremity and 2D echo.  If there is any further drop in blood pressure will consult pulmonary critical care. 2. Hypertension blood pressures in the  low normal we will hold off antihypertensives.  Follow blood pressure trends.  As needed IV hydralazine for systolic blood pressure more than 160. 3. History of neuropathy on Lyrica. 4. Chronic kidney disease stage IV follows with Dr. Justin Mend.  Follow creatinine closely.   DVT prophylaxis: Heparin infusion. Code Status: Full code. Family Communication: Discussed with patient's husband at the bedside. Disposition Plan: Home. Consults called: None. Admission status: Observation.   Rise Patience MD [facility] Pager [phone].  If 7PM-7AM, please contact night-coverage [email or web address] Password [email or web address]  11/08/2019, 11:13 PM

## 2019-11-08 NOTE — ED Triage Notes (Signed)
Pt reports chest pain that radiates to back that started last night. Pt reports new onset weakness and incontinence that began this morning. Pts pulse slow and irregular in triage.

## 2019-11-08 NOTE — ED Provider Notes (Signed)
[facility] DEPT Provider Note   CSN: [id number] Arrival date & time: 11/08/19  1225   History Chief Complaint  Patient presents with  . Chest Pain  . Weakness   Ms. Krystal Henderson Krystal Henderson is an 86 year old female with past medical history significant for chronic back pain and CKD III who presents to [facility] ED on 11/08/19 for evaluation of chest tightness and weakness.  This morning, patient reports waking up with worsening of her chronic back pain, lower chest tightness and bilateral arm pain. These symptoms have slowly resolved over the course of the day without intervention, however she noticed that she also has been particularly weak in her bilateral lower extremities. She has not had much to eat or drink today. Of note, she also reports having shortness of breath particularly with exertion over the past several weeks. Otherwise she denies fevers, chills, cough, shortness of breath, leg pain, nausea, vomiting, diarrhea. She has had no recent sick contacts, and she has received both doses of her COVID vaccination series.       Past Medical History:  Diagnosis Date  . Anemia    hx of   . Angiodysplasia 2007   @ colonoscopy  . Anxiety    PMH of  . Chronic low back pain 06/21/2014  . Diverticulosis of colon (without mention of hemorrhage)   . DJD (degenerative joint disease)   . Esophageal reflux    inactive  . History of vertebral fracture 11/2015  . HTN (hypertension)   . Hyperlipemia 2006   LDL 130  . Hypoglycemia    reactive  . Pelvic fracture ([place]) 12/30/2011   [facility]  . Peripheral neuropathy   . Personal history of colonic polyps    adenomatous  . Vitamin B12 deficiency    Patient Active Problem List   Diagnosis Date Noted  . Acute respiratory failure ([place]) 11/08/2019  . Impacted cerumen of left ear 01/03/2019  . Lesion of skin of scalp 12/30/2018  . Edema 06/22/2018  . Iron deficiency anemia 04/02/2018  . Lumbar compression  fracture ([place]) 11/26/2015  . Lower extremity edema 10/23/2015  . Chronic low back pain 06/21/2014  . Pain in joint, shoulder region 02/11/2014  . Unspecified vitamin D deficiency 03/30/2013  . Chronic renal insufficiency, stage III (moderate) 03/28/2013  . Acute kidney injury ([place]) 01/01/2012  . Spinal stenosis 12/23/2011  . Osteopenia 12/23/2011  . Renal insufficiency, mild 08/24/2011  . Vitamin B12 deficiency 09/24/2010  . IBS (irritable bowel syndrome) 09/24/2010  . Peripheral neuropathy, idiopathic 09/24/2010  . GERD 08/12/2009  . DIVERTICULOSIS, COLON 09/08/2007  . ANGIODYSPLASIA OF INTESTINE WITH HEMORRHAGE 09/08/2007  . ARTHRITIS 09/08/2007  . COLONIC POLYPS, ADENOMATOUS, HX OF 09/08/2007  . HYPERLIPIDEMIA 01/23/2007  . Essential hypertension 01/23/2007  . Osteoporosis 01/23/2007  . HYPOGLYCEMIA, REACTIVE 06/30/2006   Past Surgical History:  Procedure Laterality Date  . cataract surgery  12-26-12 and 01-09-13  . COLONOSCOPY  2011   neg  . COLONOSCOPY W/ POLYPECTOMY     X 2 , Dr  Verl Blalock; angiodysplasia. Due 2022  . DILATION AND CURETTAGE OF UTERUS    . FACIAL COSMETIC SURGERY    . HEMORROIDECTOMY    . LUMBAR LAMINECTOMY/DECOMPRESSION MICRODISCECTOMY  09/10/2011   Procedure: LUMBAR LAMINECTOMY/DECOMPRESSION MICRODISCECTOMY;  Surgeon: Johnn Hai, MD;  Location: [facility];  Service: Orthopedics;  Laterality: N/A;  decompression laminectomy L2-3, L3-4, L4-5  . ORIF WRIST FRACTURE  01/11/2012   Procedure: OPEN REDUCTION INTERNAL FIXATION (ORIF) WRIST FRACTURE;  Surgeon: Tennis Must, MD;  Location: [facility];  Service: Orthopedics;  Laterality: Right;  . TEAR DUCT PROBING     X 2  . TOTAL HIP ARTHROPLASTY  2000   right  . TUBAL LIGATION      OB History   No obstetric history on file.    Family History  Problem Relation Age of Onset  . Heart attack Father 28  . Kidney disease Mother        renal failure  . Throat cancer Sister        smoker    . Osteoporosis Sister   . Heart attack Brother 24  . Stroke Brother 56       smoker  . Depression Maternal Uncle   . Cancer Brother        X3  lung cancer, all smokers  . Peripheral vascular disease Daughter   . Colon cancer Neg Hx   . Diabetes Neg Hx    Social History   Tobacco Use  . Smoking status: Never Smoker  . Smokeless tobacco: Never Used  Substance Use Topics  . Alcohol use: Yes    Alcohol/week: 3.0 standard drinks    Types: 3 Glasses of wine per week    Comment: wine occasionally  . Drug use: No   Home Medications Prior to Admission medications   Medication Sig Start Date End Date Taking? Authorizing Provider  ALPRAZolam Duanne Moron) 0.25 MG tablet take 1 tablet by mouth every 8 to 12 hours if needed Patient taking differently: Take 0.25 mg by mouth 3 (three) times daily as needed for anxiety.  07/02/19  Yes [name] R, DO  amLODipine (NORVASC) 5 MG tablet Take 1 tablet (5 mg total) by mouth daily. 08/03/19  Yes Ann Held, DO  Ascorbic Acid (VITAMIN C) 1000 MG tablet Take 1,000 mg by mouth daily.    Yes [provider]  aspirin 81 MG tablet Take 81 mg by mouth every evening.    Yes [provider]  Calcium Carbonate (CALCIUM 600 PO) Take 1 capsule by mouth daily.    Yes [provider]  Cholecalciferol (VITAMIN D) 2000 UNITS CAPS Take 1 capsule by mouth daily.    Yes [provider]  furosemide (LASIX) 20 MG tablet TAKE 1 TABLET(20 MG) BY MOUTH DAILY 08/10/19  Yes Ann Held, DO  HYDROcodone-acetaminophen (NORCO/VICODIN) 5-325 MG tablet Take 1 tablet by mouth every 6 (six) hours as needed for moderate pain or severe pain.  06/18/16  Yes [provider]  losartan (COZAAR) 100 MG tablet TAKE 1 TABLET BY MOUTH EVERY DAY 11/01/19  Yes Lowne Chase, Yvonne R, DO  LYRICA 100 MG capsule Take 1 capsule (100 mg total) by mouth at bedtime. Patient taking differently: Take 100 mg by mouth at bedtime. Take along  with 25 mg capsule=125 mg 09/12/19  Yes Suzzanne Cloud, NP  LYRICA 25 MG capsule Take 1 capsule in the morning Patient taking differently: Take 25 mg by mouth at bedtime. Take along with 100 mg capsule=125 mg 06/12/19  Yes Suzzanne Cloud, NP  nortriptyline (PAMELOR) 10 MG capsule Take 1 capsule (10 mg total) by mouth at bedtime. 10/25/19  Yes Suzzanne Cloud, NP  Polyethyl Glycol-Propyl Glycol (SYSTANE ULTRA OP) Apply 1 drop to eye daily as needed (for dry eyes).   Yes [provider]  polyethylene glycol (MIRALAX / GLYCOLAX) packet Take 17 g by mouth daily. 01/03/12  Yes Dhungel,  Nishant, MD  VALTREX 1 g tablet Take 1,000 mg by mouth 2 (two) times daily. 01/03/19  Yes [provider]  fluticasone (FLONASE) 50 MCG/ACT nasal spray Place 2 sprays into both nostrils daily. Patient not taking: Reported on 11/08/2019 12/22/17   [name] R, DO   Allergies    Codeine, Duloxetine, and Sertraline hcl  Review of Systems   Review of Systems  Constitutional: Negative for chills and fever.  HENT: Negative for ear pain and sore throat.   Eyes: Negative for pain and visual disturbance.  Respiratory: Positive for chest tightness and shortness of breath. Negative for cough.   Cardiovascular: Negative for chest pain, palpitations and leg swelling.  Gastrointestinal: Negative for abdominal pain, diarrhea, nausea and vomiting.  Genitourinary: Negative for dysuria and hematuria.  Musculoskeletal: Positive for back pain. Negative for arthralgias.  Skin: Negative for color change and rash.  Neurological: Negative for dizziness and light-headedness.  All other systems reviewed and are negative.  Physical Exam Updated Vital Signs BP (!) 115/51   Pulse 84   Temp (!) 97.4 F (36.3 C) (Oral)   Resp 17   SpO2 95%   Physical Exam Constitutional:      General: She is not in acute distress.    Appearance: She is normal weight. She is not ill-appearing.  HENT:     Head: Normocephalic and  atraumatic.  Eyes:     Extraocular Movements: Extraocular movements intact.     Pupils: Pupils are equal, round, and reactive to light.  Cardiovascular:     Rate and Rhythm: Normal rate and regular rhythm.     Heart sounds: Normal heart sounds.  Pulmonary:     Effort: Pulmonary effort is normal. No tachypnea, accessory muscle usage or respiratory distress.     Breath sounds: Normal breath sounds.  Abdominal:     General: Bowel sounds are normal.     Palpations: Abdomen is soft.     Tenderness: There is no abdominal tenderness.  Musculoskeletal:        General: Normal range of motion.     Cervical back: Normal range of motion and neck supple.     Right lower leg: No tenderness. Edema present.     Left lower leg: No tenderness. Edema present.     Comments: +1 pitting edema to bilateral lower extremities.  Skin:    General: Skin is warm and dry.     Findings: No rash.  Neurological:     General: No focal deficit present.     Mental Status: She is alert and oriented to person, place, and time.  Psychiatric:        Mood and Affect: Mood is not anxious.        Behavior: Behavior normal.    ED Results / Procedures / Treatments   Labs (all labs ordered are listed, but only abnormal results are displayed) Labs Reviewed  BASIC METABOLIC PANEL - Abnormal; Notable for the following components:      Result Value   BUN 32 (*)    Creatinine, Ser 2.21 (*)    GFR calc non Af Amer 20 (*)    GFR calc Af Amer 23 (*)    All other components within normal limits  CBC - Abnormal; Notable for the following components:   WBC 11.5 (*)    All other components within normal limits  D-DIMER, QUANTITATIVE (NOT AT [facility]) - Abnormal; Notable for the following components:   D-Dimer, Quant 1.18 (*)  All other components within normal limits  BRAIN NATRIURETIC PEPTIDE - Abnormal; Notable for the following components:   B Natriuretic Peptide 267.1 (*)    All other components within normal limits    SARS CORONAVIRUS 2 BY RT PCR (HOSPITAL ORDER, [facility])  LACTIC ACID, PLASMA  APTT  PROTIME-INR  TROPONIN I (HIGH SENSITIVITY)  TROPONIN I (HIGH SENSITIVITY)   EKG EKG Interpretation  Date/Time:  Thursday November 08 2019 12:39:36 EDT Ventricular Rate:  96 PR Interval:    QRS Duration: 86 QT Interval:  323 QTC Calculation: 366 R Axis:   18 Text Interpretation: Sinus or ectopic atrial rhythm new Atrial premature complexes Borderline low voltage, extremity leads 12 Lead; [place] Confirmed by Blanchie Dessert [phone]) on 11/08/2019 3:55:50 PM  Radiology DG Chest 2 View  Result Date: 11/08/2019 CLINICAL DATA:  Chest pain. EXAM: CHEST - 2 VIEW COMPARISON:  07/22/2015. FINDINGS: Cardiomegaly with mild pulmonary venous congestion. Right upper lobe infiltrate/edema. No pleural effusion or pneumothorax. Sliding hiatal hernia. No acute bony abnormality. IMPRESSION: 1.  Cardiomegaly with pulmonary venous congestion. 2. Right upper lobe infiltrate/edema. Right upper lobe pneumonia cannot be excluded. 3.  Sliding hiatal hernia. Electronically Signed   By: Marcello Moores  Register   On: 11/08/2019 13:44   NM Pulmonary Perfusion  Result Date: 11/08/2019 CLINICAL DATA:  Shortness of breath and elevated D-dimer. EXAM: NUCLEAR MEDICINE PERFUSION LUNG SCAN TECHNIQUE: Perfusion images were obtained in multiple projections after intravenous injection of radiopharmaceutical. Ventilation scans intentionally deferred if perfusion scan and chest x-ray adequate for interpretation during COVID 19 epidemic. RADIOPHARMACEUTICALS:  4.2 mCi Tc-83m MAA IV COMPARISON:  Chest plain film, dated November 08, 2019, is available for comparison. FINDINGS: A small area of nonsegmental decreased perfusion is seen along the posterior aspect of the upper left lung. This is best seen on the posterior and left lateral views. No corresponding abnormalities are seen within this region on the recent chest plain film.  Homogeneous distribution of tracer uptake is noted throughout the remainder of both lungs. IMPRESSION: 1. Small area of decreased perfusion seen along the posterior aspect of the left upper lobe. Sequelae associated with a small pulmonary embolism cannot be excluded. Further evaluation with chest CTA is recommended. Electronically Signed   By: Virgina Norfolk M.D.   On: 11/08/2019 19:55   Procedures Procedures (including critical care time)  Medications Ordered in ED Medications  acetaminophen (TYLENOL) tablet 650 mg (650 mg Oral Given 11/08/19 1334)  technetium albumin aggregated (MAA) injection solution 4.2 millicurie (4.2 millicuries Intravenous Contrast Given 11/08/19 1908)    ED Course  I have reviewed the triage vital signs and the nursing notes.  Pertinent labs & imaging results that were available during my care of the patient were reviewed by me and considered in my medical decision making (see chart for details).  MDM Rules/Calculators/A&P                          Ms. Gabryelle Whitmoyer. Quade is an 86 year old female with past medical history significant for chronic back pain and CKD III who presents to [facility] ED on 11/08/19 for evaluation of chest tightness and weakness.  Ms. Jutras' report of new onset chest tightness with low-grade fever raises our suspicion for pulmonary embolism. Fortunately, she is hemodynamically stable in the emergency department aside from desaturating to the high 80s. She is unable to undergo CTA Chest in the setting of her CKD stage III with GFR  of 20. We will obtain D-dimer for initial screening. Depending on the results of this study, we will determine whether V/Q study is warranted. Given her complaints of chest discomfort, shortness of breath particularly with exertion, CKD stage III, physical examination finding of bilateral lower extremity swelling, and pulmonary venous congestion on imaging, she is likely additionally volume overloaded. Her volume overload may  be contributing to her shortness of breath and chest discomfort. Additionally, patient's overall clinical picture may be secondary to pneumonia with chest discomfort secondary to pleuritic irritation. She does have a low-grade fever and mild leukocytosis, with radiologic evidence of right upper lobe infiltrate; however she denies any cough or subjective complaints of fevers or chills. Her lungs are clear to auscultation bilaterally. She has no sick contacts and has completed her COVID vaccination series. We will hold off on starting antibiotics. Her clinical presentation is less concerning for ACS as her troponin is negative, EKG is negative for ischemic changes and her symptoms have resolved without intervention. We will get a delta troponin to further rule this out.   Patient's D-dimer elevated to 1.18, therefore, we will proceed with V/Q scan for further workup of pulmonary embolism. Additionally, BNP elevated to 267.1. Repeat troponin stable at 9.   V/Q scan revealed a small area of decreased perfusion seen along the posterior aspect of left upper lobe which may be secondary to sequelae of a small pulmonary embolism. Given these findings, patient's symptoms are suspected to be secondary to pulmonary embolism. Heparin was started in the ED, and COVID test was obtained for admission. She will need to be admitted for further management. The patient understands and agrees with this plan.  Final Clinical Impression(s) / ED Diagnoses Final diagnoses:  Single subsegmental pulmonary embolism without acute cor pulmonale [facility])    Rx / DC Orders ED Discharge Orders    None       Paulla Dolly, MD 11/08/19 2055    Blanchie Dessert, MD 11/08/19 2226

## 2019-11-08 NOTE — Progress Notes (Signed)
ANTICOAGULATION CONSULT NOTE - Initial Consult  Pharmacy Consult for Heparin Indication: pulmonary embolus  Allergies  Allergen Reactions  . Codeine Nausea Only  . Duloxetine Other (See Comments)    REACTION: intolerance--She does not remember taking this Rx- states she did not feel well   . Sertraline Hcl Other (See Comments)    REACTION: intolerance-- Did not help with Depression    Patient Measurements:   Heparin Dosing Weight: 75 kg  Vital Signs: Temp: 97.4 F (36.3 C) (08/05 1608) Temp Source: Oral (08/05 1608) BP: 115/51 (08/05 2053) Pulse Rate: 84 (08/05 2053)  Labs: Recent Labs    11/08/19 1240 11/08/19 1640  HGB 13.1  --   HCT 41.1  --   PLT 323  --   CREATININE 2.21*  --   TROPONINIHS 8 9    CrCl cannot be calculated (Unknown ideal weight.).   Medical History: Past Medical History:  Diagnosis Date  . Anemia    hx of   . Angiodysplasia 2007   @ colonoscopy  . Anxiety    PMH of  . Chronic low back pain 06/21/2014  . Diverticulosis of colon (without mention of hemorrhage)   . DJD (degenerative joint disease)   . Esophageal reflux    inactive  . History of vertebral fracture 11/2015  . HTN (hypertension)   . Hyperlipemia 2006   LDL 130  . Hypoglycemia    reactive  . Pelvic fracture ([place]) 12/30/2011   [facility]  . Peripheral neuropathy   . Personal history of colonic polyps    adenomatous  . Vitamin B12 deficiency     Medications:  Scheduled:  Infusions:  PRN:   Assessment: 86 yo female presents with chest pain and weakness, VQ reveals likely small PE.  D-dimer 1.18. Pharmacy consulted to dose IV heparin.   Baseline PTT, PT/INR pending  No anticoagulants PTA  CBC wnl  SCr 2.21, CKD III  Goal of Therapy:  Heparin level 0.3-0.7 units/ml Monitor platelets by anticoagulation protocol: Yes   Plan:   Heparin 2500 units IV bolus  Heparin IV infusion 1100 units/hr  Check heparin level in 8hrs  Daily heparin level and  CBC  Peggyann Juba, PharmD, BCPS Pharmacy: [phone] 11/08/2019,8:54 PM

## 2019-11-08 NOTE — ED Notes (Signed)
Patient transport to [facility] via stretcher.

## 2019-11-09 ENCOUNTER — Observation Stay (HOSPITAL_COMMUNITY): Payer: Medicare Other

## 2019-11-09 DIAGNOSIS — M545 Low back pain: Secondary | ICD-10-CM | POA: Diagnosis present

## 2019-11-09 DIAGNOSIS — N184 Chronic kidney disease, stage 4 (severe): Secondary | ICD-10-CM | POA: Diagnosis present

## 2019-11-09 DIAGNOSIS — Z7982 Long term (current) use of aspirin: Secondary | ICD-10-CM | POA: Diagnosis not present

## 2019-11-09 DIAGNOSIS — J9600 Acute respiratory failure, unspecified whether with hypoxia or hypercapnia: Secondary | ICD-10-CM | POA: Diagnosis present

## 2019-11-09 DIAGNOSIS — I5043 Acute on chronic combined systolic (congestive) and diastolic (congestive) heart failure: Secondary | ICD-10-CM | POA: Diagnosis not present

## 2019-11-09 DIAGNOSIS — Z841 Family history of disorders of kidney and ureter: Secondary | ICD-10-CM | POA: Diagnosis not present

## 2019-11-09 DIAGNOSIS — Z801 Family history of malignant neoplasm of trachea, bronchus and lung: Secondary | ICD-10-CM | POA: Diagnosis not present

## 2019-11-09 DIAGNOSIS — I2699 Other pulmonary embolism without acute cor pulmonale: Secondary | ICD-10-CM | POA: Diagnosis not present

## 2019-11-09 DIAGNOSIS — Z7409 Other reduced mobility: Secondary | ICD-10-CM | POA: Diagnosis not present

## 2019-11-09 DIAGNOSIS — Z885 Allergy status to narcotic agent status: Secondary | ICD-10-CM | POA: Diagnosis not present

## 2019-11-09 DIAGNOSIS — I130 Hypertensive heart and chronic kidney disease with heart failure and stage 1 through stage 4 chronic kidney disease, or unspecified chronic kidney disease: Secondary | ICD-10-CM | POA: Diagnosis not present

## 2019-11-09 DIAGNOSIS — Z8249 Family history of ischemic heart disease and other diseases of the circulatory system: Secondary | ICD-10-CM | POA: Diagnosis not present

## 2019-11-09 DIAGNOSIS — Z20822 Contact with and (suspected) exposure to covid-19: Secondary | ICD-10-CM | POA: Diagnosis not present

## 2019-11-09 DIAGNOSIS — F419 Anxiety disorder, unspecified: Secondary | ICD-10-CM | POA: Diagnosis present

## 2019-11-09 DIAGNOSIS — I2602 Saddle embolus of pulmonary artery with acute cor pulmonale: Secondary | ICD-10-CM

## 2019-11-09 DIAGNOSIS — I480 Paroxysmal atrial fibrillation: Secondary | ICD-10-CM | POA: Diagnosis not present

## 2019-11-09 DIAGNOSIS — Z888 Allergy status to other drugs, medicaments and biological substances status: Secondary | ICD-10-CM | POA: Diagnosis not present

## 2019-11-09 DIAGNOSIS — Z96641 Presence of right artificial hip joint: Secondary | ICD-10-CM | POA: Diagnosis present

## 2019-11-09 DIAGNOSIS — R269 Unspecified abnormalities of gait and mobility: Secondary | ICD-10-CM | POA: Diagnosis present

## 2019-11-09 DIAGNOSIS — Z808 Family history of malignant neoplasm of other organs or systems: Secondary | ICD-10-CM | POA: Diagnosis not present

## 2019-11-09 DIAGNOSIS — I959 Hypotension, unspecified: Secondary | ICD-10-CM | POA: Diagnosis not present

## 2019-11-09 DIAGNOSIS — J9601 Acute respiratory failure with hypoxia: Secondary | ICD-10-CM | POA: Diagnosis not present

## 2019-11-09 DIAGNOSIS — G609 Hereditary and idiopathic neuropathy, unspecified: Secondary | ICD-10-CM | POA: Diagnosis present

## 2019-11-09 DIAGNOSIS — Z79899 Other long term (current) drug therapy: Secondary | ICD-10-CM | POA: Diagnosis not present

## 2019-11-09 DIAGNOSIS — R531 Weakness: Secondary | ICD-10-CM | POA: Diagnosis present

## 2019-11-09 DIAGNOSIS — E559 Vitamin D deficiency, unspecified: Secondary | ICD-10-CM | POA: Diagnosis present

## 2019-11-09 DIAGNOSIS — G8929 Other chronic pain: Secondary | ICD-10-CM | POA: Diagnosis present

## 2019-11-09 LAB — ECHOCARDIOGRAM COMPLETE
Area-P 1/2: 3.72 cm2
Height: 65 in
S' Lateral: 2.9 cm
Single Plane A4C EF: 53.5 %
Weight: 2960 oz

## 2019-11-09 LAB — BASIC METABOLIC PANEL
Anion gap: 10 (ref 5–15)
BUN: 36 mg/dL — ABNORMAL HIGH (ref 8–23)
CO2: 24 mmol/L (ref 22–32)
Calcium: 8.7 mg/dL — ABNORMAL LOW (ref 8.9–10.3)
Chloride: 103 mmol/L (ref 98–111)
Creatinine, Ser: 2.09 mg/dL — ABNORMAL HIGH (ref 0.44–1.00)
GFR calc Af Amer: 24 mL/min — ABNORMAL LOW (ref >60)
GFR calc non Af Amer: 21 mL/min — ABNORMAL LOW (ref >60)
Glucose, Bld: 102 mg/dL — ABNORMAL HIGH (ref 70–99)
Potassium: 4.2 mmol/L (ref 3.5–5.1)
Sodium: 137 mmol/L (ref 135–145)

## 2019-11-09 LAB — LIPID PANEL
Cholesterol: 146 mg/dL (ref 0–200)
HDL: 55 mg/dL (ref >40)
LDL Cholesterol: 81 mg/dL (ref 0–99)
Total CHOL/HDL Ratio: 2.7 RATIO
Triglycerides: 48 mg/dL (ref <150)
VLDL: 10 mg/dL (ref 0–40)

## 2019-11-09 LAB — CBC
HCT: 34.4 % — ABNORMAL LOW (ref 36.0–46.0)
Hemoglobin: 11.2 g/dL — ABNORMAL LOW (ref 12.0–15.0)
MCH: 30.9 pg (ref 26.0–34.0)
MCHC: 32.6 g/dL (ref 30.0–36.0)
MCV: 95 fL (ref 80.0–100.0)
Platelets: 300 10*3/uL (ref 150–400)
RBC: 3.62 MIL/uL — ABNORMAL LOW (ref 3.87–5.11)
RDW: 15.4 % (ref 11.5–15.5)
WBC: 10.9 10*3/uL — ABNORMAL HIGH (ref 4.0–10.5)
nRBC: 0 % (ref 0.0–0.2)

## 2019-11-09 LAB — HEPARIN LEVEL (UNFRACTIONATED)
Heparin Unfractionated: 0.76 IU/mL — ABNORMAL HIGH (ref 0.30–0.70)
Heparin Unfractionated: 0.62 IU/mL (ref 0.30–0.70)

## 2019-11-09 LAB — TROPONIN I (HIGH SENSITIVITY)
Troponin I (High Sensitivity): 9 ng/L (ref <18)
Troponin I (High Sensitivity): 10 ng/L (ref <18)
Troponin I (High Sensitivity): 11 ng/L (ref <18)

## 2019-11-09 LAB — HEMOGLOBIN A1C
Hgb A1c MFr Bld: 5.7 % — ABNORMAL HIGH (ref 4.8–5.6)
Mean Plasma Glucose: 116.89 mg/dL

## 2019-11-09 LAB — LACTIC ACID, PLASMA: Lactic Acid, Venous: 0.9 mmol/L (ref 0.5–1.9)

## 2019-11-09 LAB — VITAMIN B12: Vitamin B-12: 59 pg/mL — ABNORMAL LOW (ref 180–914)

## 2019-11-09 LAB — TSH: TSH: 2.432 u[IU]/mL (ref 0.350–4.500)

## 2019-11-09 NOTE — Progress Notes (Signed)
Brief Pharmacy Anti-coagulation note:  For full details see note from D. Wofford Pharm D from earlier today  HL 0.62 therapeutic on 1000 units/hr No bleeding noted Continue heparin 1000 units/hr Check heparin level with am labs  [place] 11/09/2019, 6:44 PM

## 2019-11-09 NOTE — Progress Notes (Addendum)
PROGRESS NOTE    NAZIYAH TIESZEN   KXF:[id number]  DOB: May 23, 1932  DOA: 11/08/2019     0  PCP: Ann Held, DO  CC: CP, SOB  Hospital Course: Ms. Carby is an 86 yo CF with PMH CKDIV, HTN, neuropathy, B12 deficiency, hyperlipidemia, DJD, diverticulosis, anxiety who presented to the ER with chest pain and shortness of breath.  She described the chest pain is moving across her chest horizontally and has been occurring intermittently for 3 to 4 days prior to presenting. She was lethargic appearing and weak in the ER.  When asked about her physical mobility got home she overestimates stating that she does move around the house cleaning some and being active, however also endorses being extra tired lately and not doing much from the weakness.  Denies any recent long travel trips.  ED Course: [name] was hypoxic requiring 2 L oxygen to maintain sats with chest x-ray showing venous congestion versus pneumonia.  D-dimer was mildly elevated around 1.1 and nuclear scan was done since CT angiogram cannot be done due to her renal function.  Nuclear scan done shows possibility of a filling defect concerning for pulmonary embolism and patient was started on heparin.  EKG shows low voltage.  Otherwise unremarkable.  Patient's labs are significant for creatinine around 0.2 WBC 11.4 high-sensitivity troponin was negative BNP 267.1 D-dimer 1.1.  She was admitted for further trending of her troponins to rule out cardiac etiology of her symptoms.  She will ultimately be transitioned from heparin drip to a DOAC.  Lower extremity duplex and echo also ordered on admission.   Interval History:  Admitted overnight with chest pain and shortness of breath.  After work-up appears to have probable PE and was started on heparin drip.  She is describing pleuritic chest pain especially with inspiration.  Started on 2 L oxygen as well.  Overall she was comfortable and resting in bed this morning when seen.   Understands plan is for further monitoring overnight and hopeful for discharging home tomorrow.  Old records reviewed in assessment of this patient  ROS: Constitutional: negative for chills and fevers, Respiratory: positive for Shortness of breath, Cardiovascular: positive for Pleuritic chest pain and Gastrointestinal: negative for abdominal pain  Assessment & Plan: Pulmonary embolism ([place]) -Patient endorses substernal chest pain radiating horizontally for 3 to 4 days prior to admission.  She does describe some decreased mobility at home; no significant leg pain endorsed nor swelling.  She has not had any recent surgeries nor traveled long distances in a car etc.  Due to CKD stage IV, unable to undergo CTA, therefore underwent VQ scan.  Probability of PE is not specified but she does appear to have a filling defect in the left upper lobe.  Report copied below "Small area of decreased perfusion seen along the posterior aspect of the left upper lobe. Sequelae associated with a small pulmonary embolism cannot be excluded. Further evaluation with chest CTA is recommended." -Started on a heparin drip on admission.  Will trend her troponins and CBC; if stable in a.m., will transition to [place] -Follow-up echo and lower extremity duplex - echo: LV global hypokinesis, EF 40-45%, Gr 1 dd.  This is a reduction in her EF from September 2019 when it was 60%. - no obvious ischemia noted on EKG; repeat in am  Acute respiratory failure with hypoxia ([place]) -Considered due to acute PE.  Still possibility of underlying pneumonia but less likely at this time -Continue  O2 and wean if able.  If not, will arrange for home O2  Essential hypertension -Currently low/normal.  If elevates will treat  CKD (chronic kidney disease), stage IV (HCC) -Currently at baseline. -Current function precludes use of contrasted CT  Peripheral neuropathy, idiopathic -Continue Lyrica   Antimicrobials: None  DVT prophylaxis:  Heparin drip Code Status: Full Family Communication: None present Disposition Plan:  Status is: Observation  The patient remains OBS appropriate and will d/c before 2 midnights.  Dispo: The patient is from: Home              Anticipated d/c is to: Home              Anticipated d/c date is: 1 day              Patient currently is not medically stable to d/c.       Objective: Blood pressure (!) 129/57, pulse 70, temperature 98.4 F (36.9 C), resp. rate 16, height 5\' 5"  (1.651 m), weight 83.9 kg, SpO2 96 %.  Examination: General appearance: alert, cooperative and no distress Head: Normocephalic, without obvious abnormality, atraumatic Eyes: EOMI Lungs: clear to auscultation bilaterally Heart: regular rate and rhythm and S1, S2 normal Abdomen: normal findings: bowel sounds normal and soft, non-tender Extremities: No edema Skin: mobility and turgor normal Neurologic: Grossly normal  Consultants:  -None  Procedures:   11/08/2019: VQ scan  11/09/2019: Lower extremity duplex  11/09/2019: TTE  Data Reviewed: I have personally reviewed following labs and imaging studies Results for orders placed or performed during the hospital encounter of 11/08/19 (from the past 24 hour(s))  Troponin I (High Sensitivity)     Status: None   Collection Time: 11/08/19  4:40 PM  Result Value Ref Range   Troponin I (High Sensitivity) 9 <18 ng/L  D-dimer, quantitative (not at [facility])     Status: Abnormal   Collection Time: 11/08/19  4:40 PM  Result Value Ref Range   D-Dimer, Quant 1.18 (H) 0.00 - 0.50 ug/mL-FEU  Brain natriuretic peptide     Status: Abnormal   Collection Time: 11/08/19  4:40 PM  Result Value Ref Range   B Natriuretic Peptide 267.1 (H) 0.0 - 100.0 pg/mL  APTT     Status: None   Collection Time: 11/08/19  9:00 PM  Result Value Ref Range   aPTT 36 24 - 36 seconds  Protime-INR     Status: Abnormal   Collection Time: 11/08/19  9:00 PM  Result Value Ref Range   Prothrombin Time  16.5 (H) 11.4 - 15.2 seconds   INR 1.4 (H) 0.8 - 1.2  SARS Coronavirus 2 by RT PCR (hospital order, performed in [place] hospital lab) Nasopharyngeal Nasopharyngeal Swab     Status: None   Collection Time: 11/08/19  9:14 PM   Specimen: Nasopharyngeal Swab  Result Value Ref Range   SARS Coronavirus 2 NEGATIVE NEGATIVE  Basic metabolic panel     Status: Abnormal   Collection Time: 11/09/19  1:50 AM  Result Value Ref Range   Sodium 137 135 - 145 mmol/L   Potassium 4.2 3.5 - 5.1 mmol/L   Chloride 103 98 - 111 mmol/L   CO2 24 22 - 32 mmol/L   Glucose, Bld 102 (H) 70 - 99 mg/dL   BUN 36 (H) 8 - 23 mg/dL   Creatinine, Ser 2.09 (H) 0.44 - 1.00 mg/dL   Calcium 8.7 (L) 8.9 - 10.3 mg/dL   GFR calc non Af Amer 21 (L) >  60 mL/min   GFR calc Af Amer 24 (L) >60 mL/min   Anion gap 10 5 - 15  CBC     Status: Abnormal   Collection Time: 11/09/19  1:50 AM  Result Value Ref Range   WBC 10.9 (H) 4.0 - 10.5 K/uL   RBC 3.62 (L) 3.87 - 5.11 MIL/uL   Hemoglobin 11.2 (L) 12.0 - 15.0 g/dL   HCT 34.4 (L) 36 - 46 %   MCV 95.0 80.0 - 100.0 fL   MCH 30.9 26.0 - 34.0 pg   MCHC 32.6 30.0 - 36.0 g/dL   RDW 15.4 11.5 - 15.5 %   Platelets 300 150 - 400 K/uL   nRBC 0.0 0.0 - 0.2 %  Lactic acid, plasma     Status: None   Collection Time: 11/09/19  1:50 AM  Result Value Ref Range   Lactic Acid, Venous 0.9 0.5 - 1.9 mmol/L  TSH     Status: None   Collection Time: 11/09/19  1:50 AM  Result Value Ref Range   TSH 2.432 0.350 - 4.500 uIU/mL  Hemoglobin A1c     Status: Abnormal   Collection Time: 11/09/19  1:50 AM  Result Value Ref Range   Hgb A1c MFr Bld 5.7 (H) 4.8 - 5.6 %   Mean Plasma Glucose 116.89 mg/dL  Lipid panel     Status: None   Collection Time: 11/09/19  1:50 AM  Result Value Ref Range   Cholesterol 146 0 - 200 mg/dL   Triglycerides 48 <150 mg/dL   HDL 55 >40 mg/dL   Total CHOL/HDL Ratio 2.7 RATIO   VLDL 10 0 - 40 mg/dL   LDL Cholesterol 81 0 - 99 mg/dL  Vitamin B12     Status: Abnormal    Collection Time: 11/09/19  1:50 AM  Result Value Ref Range   Vitamin B-12 59 (L) 180 - 914 pg/mL  Heparin level (unfractionated)     Status: Abnormal   Collection Time: 11/09/19  7:46 AM  Result Value Ref Range   Heparin Unfractionated 0.76 (H) 0.30 - 0.70 IU/mL  Troponin I (High Sensitivity)     Status: None   Collection Time: 11/09/19 10:21 AM  Result Value Ref Range   Troponin I (High Sensitivity) 10 <18 ng/L    Recent Results (from the past 240 hour(s))  SARS Coronavirus 2 by RT PCR (hospital order, performed in [place] hospital lab) Nasopharyngeal Nasopharyngeal Swab     Status: None   Collection Time: 11/08/19  9:14 PM   Specimen: Nasopharyngeal Swab  Result Value Ref Range Status   SARS Coronavirus 2 NEGATIVE NEGATIVE Final    Comment: (NOTE) SARS-CoV-2 target nucleic acids are NOT DETECTED.  The SARS-CoV-2 RNA is generally detectable in upper and lower respiratory specimens during the acute phase of infection. The lowest concentration of SARS-CoV-2 viral copies this assay can detect is 250 copies / mL. A negative result does not preclude SARS-CoV-2 infection and should not be used as the sole basis for treatment or other patient management decisions.  A negative result may occur with improper specimen collection / handling, submission of specimen other than nasopharyngeal swab, presence of viral mutation(s) within the areas targeted by this assay, and inadequate number of viral copies (<250 copies / mL). A negative result must be combined with clinical observations, patient history, and epidemiological information.  Fact Sheet for Patients:   StrictlyIdeas.no  Fact Sheet for Healthcare Providers: BankingDealers.co.za  This test is not yet approved  or  cleared by the [place] and has been authorized for detection and/or diagnosis of SARS-CoV-2 by FDA under an Emergency Use Authorization (EUA).  This EUA will  remain in effect (meaning this test can be used) for the duration of the COVID-19 declaration under Section 564(b)(1) of the Act, 21 U.S.C. section 360bbb-3(b)(1), unless the authorization is terminated or revoked sooner.  Performed at [facility], [place] [address], [place], [place] [address]      Radiology Studies: DG Chest 2 View  Result Date: 11/08/2019 CLINICAL DATA:  Chest pain. EXAM: CHEST - 2 VIEW COMPARISON:  07/22/2015. FINDINGS: Cardiomegaly with mild pulmonary venous congestion. Right upper lobe infiltrate/edema. No pleural effusion or pneumothorax. Sliding hiatal hernia. No acute bony abnormality. IMPRESSION: 1.  Cardiomegaly with pulmonary venous congestion. 2. Right upper lobe infiltrate/edema. Right upper lobe pneumonia cannot be excluded. 3.  Sliding hiatal hernia. Electronically Signed   By: Marcello Moores  Register   On: 11/08/2019 13:44   NM Pulmonary Perfusion  Result Date: 11/08/2019 CLINICAL DATA:  Shortness of breath and elevated D-dimer. EXAM: NUCLEAR MEDICINE PERFUSION LUNG SCAN TECHNIQUE: Perfusion images were obtained in multiple projections after intravenous injection of radiopharmaceutical. Ventilation scans intentionally deferred if perfusion scan and chest x-ray adequate for interpretation during COVID 19 epidemic. RADIOPHARMACEUTICALS:  4.2 mCi Tc-90m MAA IV COMPARISON:  Chest plain film, dated November 08, 2019, is available for comparison. FINDINGS: A small area of nonsegmental decreased perfusion is seen along the posterior aspect of the upper left lung. This is best seen on the posterior and left lateral views. No corresponding abnormalities are seen within this region on the recent chest plain film. Homogeneous distribution of tracer uptake is noted throughout the remainder of both lungs. IMPRESSION: 1. Small area of decreased perfusion seen along the posterior aspect of the left upper lobe. Sequelae associated with a small pulmonary embolism cannot be  excluded. Further evaluation with chest CTA is recommended. Electronically Signed   By: Virgina Norfolk M.D.   On: 11/08/2019 19:55   ECHOCARDIOGRAM COMPLETE  Result Date: 11/09/2019    ECHOCARDIOGRAM REPORT   Patient Name:   KEYATTA TOLLES Date of Exam: 11/09/2019 Medical Rec #:  [id number]         Height:       65.0 in Accession #:    [id number]        Weight:       185.0 lb Date of Birth:  1933-01-07          BSA:          1.914 m Patient Age:    10 years          BP:           122/77 mmHg Patient Gender: F                 HR:           73 bpm. Exam Location:  Inpatient Procedure: 2D Echo, Color Doppler and Cardiac Doppler Indications:    I26.02 Pulmonary embolus  History:        Patient has no prior history of Echocardiogram examinations.                 Risk Factors:Hypertension and Dyslipidemia.  Sonographer:    Raquel Sarna Senior RDCS Referring Phys: [place]  Sonographer Comments: Technically difficult study due to poor echo windows and hiatal hernia. IMPRESSIONS  1. Mild global reduction in LV systolic function; grade  1 diastolic dysfunction; right heart not well visualized.  2. Left ventricular ejection fraction, by estimation, is 45 to 50%. The left ventricle has mildly decreased function. The left ventricle demonstrates global hypokinesis. Left ventricular diastolic parameters are consistent with Grade I diastolic dysfunction (impaired relaxation). Elevated left atrial pressure.  3. Right ventricular systolic function is normal. The right ventricular size is normal. Tricuspid regurgitation signal is inadequate for assessing PA pressure.  4. Left atrial size was moderately dilated.  5. The mitral valve is normal in structure. Mild mitral valve regurgitation. No evidence of mitral stenosis.  6. The aortic valve is tricuspid. Aortic valve regurgitation is mild. No aortic stenosis is present.  7. Aortic dilatation noted. There is mild dilatation of the aortic root measuring 39 mm.  8. The  inferior vena cava is normal in size with greater than 50% respiratory variability, suggesting right atrial pressure of 3 mmHg. FINDINGS  Left Ventricle: Left ventricular ejection fraction, by estimation, is 45 to 50%. The left ventricle has mildly decreased function. The left ventricle demonstrates global hypokinesis. The left ventricular internal cavity size was normal in size. There is  no left ventricular hypertrophy. Left ventricular diastolic function could not be evaluated due to atrial fibrillation. Left ventricular diastolic parameters are consistent with Grade I diastolic dysfunction (impaired relaxation). Elevated left atrial pressure. Right Ventricle: The right ventricular size is normal.Right ventricular systolic function is normal. Tricuspid regurgitation signal is inadequate for assessing PA pressure. The tricuspid regurgitant velocity is 1.95 m/s, and with an assumed right atrial pressure of 3 mmHg, the estimated right ventricular systolic pressure is 45.6 mmHg. Left Atrium: Left atrial size was moderately dilated. Right Atrium: Right atrial size was normal in size. Pericardium: There is no evidence of pericardial effusion. Mitral Valve: The mitral valve is normal in structure. Normal mobility of the mitral valve leaflets. Mild mitral annular calcification. Mild mitral valve regurgitation. No evidence of mitral valve stenosis. Tricuspid Valve: The tricuspid valve is normal in structure. Tricuspid valve regurgitation is trivial. No evidence of tricuspid stenosis. Aortic Valve: The aortic valve is tricuspid. Aortic valve regurgitation is mild. No aortic stenosis is present. Pulmonic Valve: The pulmonic valve was normal in structure. Pulmonic valve regurgitation is trivial. No evidence of pulmonic stenosis. Aorta: Aortic dilatation noted. There is mild dilatation of the aortic root measuring 39 mm. Venous: The inferior vena cava is normal in size with greater than 50% respiratory variability, suggesting  right atrial pressure of 3 mmHg.  Additional Comments: Mild global reduction in LV systolic function; grade 1 diastolic dysfunction; right heart not well visualized.  LEFT VENTRICLE PLAX 2D LVIDd:         4.97 cm     Diastology LVIDs:         2.90 cm     LV e' lateral:   8.38 cm/s LV PW:         0.82 cm     LV E/e' lateral: 9.0 LV IVS:        1.00 cm     LV e' medial:    5.00 cm/s LVOT diam:     2.10 cm     LV E/e' medial:  15.1 LV SV:         62 LV SV Index:   32 LVOT Area:     3.46 cm  LV Volumes (MOD) LV vol d, MOD A4C: 95.0 ml LV vol s, MOD A4C: 44.2 ml LV SV MOD A4C:     95.0 ml  RIGHT VENTRICLE RV S prime:     12.00 cm/s TAPSE (M-mode): 2.1 cm LEFT ATRIUM             Index       RIGHT ATRIUM           Index LA diam:        4.30 cm 2.25 cm/m  RA Area:     16.30 cm LA Vol (A2C):   97.4 ml 50.90 ml/m RA Volume:   41.00 ml  21.42 ml/m LA Vol (A4C):   89.1 ml 46.56 ml/m LA Biplane Vol: 98.8 ml 51.63 ml/m  AORTIC VALVE LVOT Vmax:   76.10 cm/s LVOT Vmean:  57.300 cm/s LVOT VTI:    0.179 m  AORTA Ao Root diam: 3.40 cm Ao Asc diam:  3.85 cm MITRAL VALVE               TRICUSPID VALVE MV Area (PHT): 3.72 cm    TR Peak grad:   15.2 mmHg MV Decel Time: 204 msec    TR Vmax:        195.00 cm/s MV E velocity: 75.70 cm/s MV A velocity: 86.30 cm/s  SHUNTS MV E/A ratio:  0.88        Systemic VTI:  0.18 m                            Systemic Diam: 2.10 cm Kirk Ruths MD Electronically signed by Kirk Ruths MD Signature Date/Time: 11/09/2019/2:53:34 PM    Final    NM Pulmonary Perfusion  Final Result    DG Chest 2 View  Final Result    VAS [place] LOWER EXTREMITY VENOUS (DVT)    (Results Pending)     Scheduled Meds: . vitamin C  1,000 mg Oral Daily  . aspirin EC  81 mg Oral QPM  . nortriptyline  10 mg Oral QHS  . polyethylene glycol  17 g Oral Daily  . pregabalin  125 mg Oral QHS   PRN Meds: ALPRAZolam, HYDROcodone-acetaminophen, ondansetron **OR** ondansetron (ZOFRAN) IV Continuous Infusions: . heparin  1,000 Units/hr (11/09/19 0921)      LOS: 0 days  Time spent: Greater than 50% of the 35 minute visit was spent in counseling/coordination of care for the patient as laid out in the A&P.   Dwyane Dee, MD [facility] 11/09/2019, 3:56 PM   Contact via secure chat.  To contact the attending provider between 7A-7P or the covering provider during after hours 7P-7A, please log into the web site [email or web address] and access using universal [place] password for that web site. If you do not have the password, please call the hospital operator.

## 2019-11-09 NOTE — Progress Notes (Signed)
Pt arrived to room 1527 from ED. VS are stable.

## 2019-11-09 NOTE — Plan of Care (Signed)
Poc initiated

## 2019-11-09 NOTE — Assessment & Plan Note (Signed)
-  Currently at baseline. -Current function precludes use of contrasted CT

## 2019-11-09 NOTE — Progress Notes (Signed)
Bilateral lower extremity venous duplex completed. Refer to "CV Proc" under chart review to view preliminary results.  11/09/2019 4:19 PM Kelby Aline., MHA, RVT, RDCS, RDMS

## 2019-11-09 NOTE — ED Notes (Signed)
Attempted to take patient upstairs, but they are doing a vascular study at bedside.

## 2019-11-09 NOTE — ED Notes (Signed)
Report given to Mollie Germany, Therapist, sports.

## 2019-11-09 NOTE — Assessment & Plan Note (Signed)
-  Currently low/normal.  If elevates will treat

## 2019-11-09 NOTE — ED Notes (Signed)
Attempted to call report to RN, she is in with another patient and will call back.

## 2019-11-09 NOTE — Progress Notes (Signed)
Echocardiogram 2D Echocardiogram has been performed.  Oneal Deputy Nicholes Hibler 11/09/2019, 2:44 PM

## 2019-11-09 NOTE — Assessment & Plan Note (Signed)
Continue Lyrica. 

## 2019-11-09 NOTE — Hospital Course (Addendum)
Krystal Henderson is an 86 yo CF with PMH CKDIV, HTN, neuropathy, B12 deficiency, hyperlipidemia, DJD, diverticulosis, anxiety who presented to the ER with chest pain and shortness of breath.  She described the chest pain is moving across her chest horizontally and has been occurring intermittently for 3 to 4 days prior to presenting. She was lethargic appearing and weak in the ER.  When asked about her physical mobility got home she overestimates stating that she does move around the house cleaning some and being active, however also endorses being extra tired lately and not doing much from the weakness.  Denies any recent long travel trips.  ED Course: [name] was hypoxic requiring 2 L oxygen to maintain sats with chest x-ray showing venous congestion versus pneumonia.  D-dimer was mildly elevated around 1.1 and nuclear scan was done since CT angiogram cannot be done due to her renal function.  Nuclear scan done shows possibility of a filling defect concerning for pulmonary embolism and patient was started on heparin.  EKG shows low voltage.  Otherwise unremarkable.  Patient's labs are significant for creatinine around 0.2 WBC 11.4 high-sensitivity troponin was negative BNP 267.1 D-dimer 1.1.  She was admitted for further trending of her troponins to rule out cardiac etiology of her symptoms.  She will ultimately be transitioned from heparin drip to a DOAC.  Lower extremity duplex and echo also ordered on admission. LE duplex was negative for DVT. Echo was notable for EF 45-50%, Gr 1 DD, and LV global hypokinesis.   On 8/7 she had also converted to afib with rates 100-115. She had no prior history of afib. Trops had been trended and remained negative. Repeat EKG showed afib but no obvious ischemia.  She was recommended to follow up with cardiology at discharge as well.   She was transitioned from heparin to Eliquis after discussion of anticoagulation options as well as risks/benefits.   She ambulated  on room air and had desaturation to 86%.  O2 was ordered prior to discharge. She was recommended for rehab by PT evaluation during hospitalization however she declined and wished to go home at discharge.  Home health was arranged prior to discharge.

## 2019-11-09 NOTE — Assessment & Plan Note (Addendum)
-  Considered due to acute PE.  Still possibility of underlying pneumonia but less likely at this time -Continue O2 and wean if able.  If not, will arrange for home O2

## 2019-11-09 NOTE — Assessment & Plan Note (Addendum)
-  Patient endorses substernal chest pain radiating horizontally for 3 to 4 days prior to admission.  She does describe some decreased mobility at home; no significant leg pain endorsed nor swelling.  She has not had any recent surgeries nor traveled long distances in a car etc.  Due to CKD stage IV, unable to undergo CTA, therefore underwent VQ scan.  Probability of PE is not specified but she does appear to have a filling defect in the left upper lobe.  Report copied below "Small area of decreased perfusion seen along the posterior aspect of the left upper lobe. Sequelae associated with a small pulmonary embolism cannot be excluded. Further evaluation with chest CTA is recommended." -Started on a heparin drip on admission.  Troponins trended and remained negative.  We discussed options of anticoagulation as well as risks and benefits of each.  Patient has elected to start on Eliquis.  Will ask for pharmacy assistance with dosing given borderline low creatinine clearance as well as her age. -Lower extremity duplex negative for DVT - echo: LV global hypokinesis, EF 40-45%, Gr 1 dd.  This is a reduction in her EF from September 2019 when it was 60%. - no obvious ischemia noted on EKG; will need outpatient cardiology follow-up

## 2019-11-09 NOTE — Progress Notes (Signed)
[place] for Heparin Indication: pulmonary embolus  Allergies  Allergen Reactions  . Codeine Nausea Only  . Duloxetine Other (See Comments)    REACTION: intolerance--She does not remember taking this Rx- states she did not feel well   . Sertraline Hcl Other (See Comments)    REACTION: intolerance-- Did not help with Depression    Patient Measurements: Height: 5\' 5"  (165.1 cm) Weight: 83.9 kg (185 lb) IBW/kg (Calculated) : 57 Heparin Dosing Weight: 75 kg  Vital Signs: BP: 104/57 (08/06 0812) Pulse Rate: 135 (08/06 0812)  Labs: Recent Labs    11/08/19 0150 11/08/19 1240 11/08/19 1640 11/08/19 2100 11/09/19 0150 11/09/19 0746  HGB  --  13.1  --   --  11.2*  --   HCT  --  41.1  --   --  34.4*  --   PLT  --  323  --   --  300  --   APTT  --   --   --  36  --   --   LABPROT  --   --   --  16.5*  --   --   INR  --   --   --  1.4*  --   --   HEPARINUNFRC  --   --   --   --   --  0.76*  CREATININE  --  2.21*  --   --  2.09*  --   TROPONINIHS 9 8 9   --   --   --     Estimated Creatinine Clearance: 21.1 mL/min (A) (by C-G formula based on SCr of 2.09 mg/dL (H)).  Medications:  . vitamin C  1,000 mg Oral Daily  . aspirin EC  81 mg Oral QPM  . nortriptyline  10 mg Oral QHS  . polyethylene glycol  17 g Oral Daily  . pregabalin  125 mg Oral QHS   PRN medications: ALPRAZolam, HYDROcodone-acetaminophen, ondansetron **OR** ondansetron (ZOFRAN) IV  Infusions: . heparin 1,100 Units/hr (11/08/19 2235)    Assessment: 86 yo female presents with chest pain and weakness, VQ reveals likely small PE.  D-dimer 1.18. Pharmacy consulted to dose IV heparin.   Baseline aPTT, INR: minimally elevated  No anticoagulants PTA  Today, 11/09/2019:   CBC Hgb slightly low (likely dilutional); Plt stable WNL  Most recent heparin level slightly SUPRAtherapeutic on 1100 units/hr  SCr elevated at baseline  No bleeding or infusion issues per  RN  Goal of Therapy:  Heparin level 0.3-0.7 units/ml Monitor platelets by anticoagulation protocol: Yes   Plan:   Decrease heparin infusion to 1000 units/hr  Check heparin level in 8hrs  Daily heparin level and CBC  Monitor for s/s bleeding or worsening thrombosis  F/u plans for long-term anticoag  Reuel Boom, PharmD, BCPS [phone] 11/09/2019, 9:00 AM

## 2019-11-10 ENCOUNTER — Other Ambulatory Visit: Payer: Self-pay

## 2019-11-10 DIAGNOSIS — Z7409 Other reduced mobility: Secondary | ICD-10-CM

## 2019-11-10 DIAGNOSIS — I480 Paroxysmal atrial fibrillation: Secondary | ICD-10-CM

## 2019-11-10 LAB — CBC
HCT: 38.5 % (ref 36.0–46.0)
Hemoglobin: 12.3 g/dL (ref 12.0–15.0)
MCH: 30.8 pg (ref 26.0–34.0)
MCHC: 31.9 g/dL (ref 30.0–36.0)
MCV: 96.3 fL (ref 80.0–100.0)
Platelets: 299 10*3/uL (ref 150–400)
RBC: 4 MIL/uL (ref 3.87–5.11)
RDW: 15.5 % (ref 11.5–15.5)
WBC: 7.9 10*3/uL (ref 4.0–10.5)
nRBC: 0 % (ref 0.0–0.2)

## 2019-11-10 LAB — BASIC METABOLIC PANEL
Anion gap: 9 (ref 5–15)
BUN: 31 mg/dL — ABNORMAL HIGH (ref 8–23)
CO2: 23 mmol/L (ref 22–32)
Calcium: 9.1 mg/dL (ref 8.9–10.3)
Chloride: 104 mmol/L (ref 98–111)
Creatinine, Ser: 1.64 mg/dL — ABNORMAL HIGH (ref 0.44–1.00)
GFR calc Af Amer: 33 mL/min — ABNORMAL LOW (ref >60)
GFR calc non Af Amer: 28 mL/min — ABNORMAL LOW (ref >60)
Glucose, Bld: 88 mg/dL (ref 70–99)
Potassium: 4.8 mmol/L (ref 3.5–5.1)
Sodium: 136 mmol/L (ref 135–145)

## 2019-11-10 LAB — MAGNESIUM: Magnesium: 2.3 mg/dL (ref 1.7–2.4)

## 2019-11-10 LAB — TROPONIN I (HIGH SENSITIVITY): Troponin I (High Sensitivity): 9 ng/L (ref <18)

## 2019-11-10 LAB — HEPARIN LEVEL (UNFRACTIONATED): Heparin Unfractionated: 0.58 IU/mL (ref 0.30–0.70)

## 2019-11-10 MED ORDER — METOPROLOL TARTRATE 12.5 MG HALF TABLET
12.5000 mg | ORAL_TABLET | Freq: Two times a day (BID) | ORAL | Status: DC
  Filled 2019-11-10: qty 1

## 2019-11-10 MED ORDER — APIXABAN 5 MG PO TABS
10.0000 mg | ORAL_TABLET | Freq: Two times a day (BID) | ORAL | Status: DC
  Administered 2019-11-10 – 2019-11-12 (×5): 10 mg via ORAL
  Filled 2019-11-10 (×7): qty 2

## 2019-11-10 MED ORDER — APIXABAN 5 MG PO TABS: 5.0000 mg | ORAL_TABLET | Freq: Two times a day (BID) | ORAL | Status: DC

## 2019-11-10 NOTE — Progress Notes (Signed)
ANTICOAGULATION CONSULT NOTE - Follow Up Consult  Pharmacy Consult for Heparin - change to apixaban 8/7 Indication: pulmonary embolus  Allergies  Allergen Reactions  . Codeine Nausea Only  . Duloxetine Other (See Comments)    REACTION: intolerance--She does not remember taking this Rx- states she did not feel well   . Sertraline Hcl Other (See Comments)    REACTION: intolerance-- Did not help with Depression    Patient Measurements: Height: 5\' 5"  (165.1 cm) Weight: 82.1 kg (181 lb) IBW/kg (Calculated) : 57 Heparin Dosing Weight: 75 kg  Vital Signs: Temp: 97.9 F (36.6 C) (08/07 0302) BP: 107/67 (08/07 0302) Pulse Rate: 74 (08/07 0302)  Labs: Recent Labs    11/08/19 1240 11/08/19 1240 11/08/19 1640 11/08/19 2100 11/09/19 0150 11/09/19 0746 11/09/19 1021 11/09/19 1747 11/10/19 0326 11/10/19 0500  HGB 13.1   < >  --   --  11.2*  --   --   --  12.3  --   HCT 41.1  --   --   --  34.4*  --   --   --  38.5  --   PLT 323  --   --   --  300  --   --   --  299  --   APTT  --   --   --  36  --   --   --   --   --   --   LABPROT  --   --   --  16.5*  --   --   --   --   --   --   INR  --   --   --  1.4*  --   --   --   --   --   --   HEPARINUNFRC  --   --   --   --   --  0.76*  --  0.62 0.58  --   CREATININE 2.21*  --   --   --  2.09*  --   --   --  1.64*  --   TROPONINIHS 8  --    < >  --   --   --  10 11  --  9   < > = values in this interval not displayed.    Estimated Creatinine Clearance: 26.5 mL/min (A) (by C-G formula based on SCr of 1.64 mg/dL (H)).   Medications:  Infusions:    Assessment: 86 yo female presents with chest pain and weakness, VQ reveals likely small PE.  D-dimer 1.18. Pharmacy consulted to dose IV heparin.  Baseline aPTT, INR: minimally elevated  No anticoagulants PTA  8/7: Changing IV heparin to PO apixaban  Goal of Therapy:   Monitor platelets by anticoagulation protocol: Yes   Plan:   Discontinue IV heparin NOW  At same time IV  heparin is stopped, start PO apixaban 10mg  BID x 7 days then change to 5mg  BID thereafter  Daily SCr and CBC  Monitor for s/s bleeding or worsening thrombosis  Will counsel on discharge   Adrian Saran, PharmD, BCPS 11/10/2019 9:12 AM

## 2019-11-10 NOTE — Evaluation (Signed)
Physical Therapy Evaluation Patient Details Name: Krystal Henderson MRN: [id number] DOB: 1932-10-11 Today's Date: 11/10/2019   History of Present Illness  86 yo CF with PMH CKDIV, HTN, neuropathy, B12 deficiency, hyperlipidemia, DJD, diverticulosis, anxiety who presented to the ER with chest pain and shortness of breath admitted for PE  Clinical Impression  Pt admitted with above diagnosis.  Pt currently with functional limitations due to the deficits listed below (see PT Problem List). Pt will benefit from skilled PT to increase their independence and safety with mobility to allow discharge to the venue listed below.  Pt assisted OOB to recliner.  Pt with very slow movement and fatigues quickly.  SPO2 95% on room air after transfer, and pt denies SOB.  Pt states her spouse can assist her at home.  Recommending SNF at this time however pt does prefer home with HHPT.     Follow Up Recommendations SNF;Supervision/Assistance - 24 hour    Equipment Recommendations  None recommended by PT    Recommendations for Other Services       Precautions / Restrictions Precautions Precautions: Fall Precaution Comments: monitor sats      Mobility  Bed Mobility Overal bed mobility: Needs Assistance Bed Mobility: Supine to Sit     Supine to sit: Mod assist;HOB elevated     General bed mobility comments: verbal cues for technique, assist required for R LE and scooting to EOB, utilized bed pad  Transfers Overall transfer level: Needs assistance Equipment used: Rolling walker (2 wheeled) Transfers: Sit to/from [facility] Sit to Stand: Min assist;From elevated surface Stand pivot transfers: Min assist       General transfer comment: verbal cues for hand placement, assist to rise, steady and control descent, fatigues quickly, SPO2 95% on room air after transfer (undergarment removed and pt stated to throw away - soaked with urine)  Ambulation/Gait                 Stairs            Wheelchair Mobility    Modified Rankin (Stroke Patients Only)       Balance Overall balance assessment: Needs assistance         Standing balance support: Bilateral upper extremity supported Standing balance-Leahy Scale: Poor                               Pertinent Vitals/Pain Pain Assessment: No/denies pain    Home Living Family/patient expects to be discharged to:: Private residence Living Arrangements: Spouse/significant other Available Help at Discharge: Family Type of Home: House Home Access: Stairs to enter Entrance Stairs-Rails: Can reach both Entrance Stairs-Number of Steps: 3 Home Layout: One level Home Equipment: Environmental consultant - 2 wheels      Prior Function Level of Independence: Independent with assistive device(s)               Hand Dominance        Extremity/Trunk Assessment        Lower Extremity Assessment Lower Extremity Assessment: Generalized weakness;RLE deficits/detail;LLE deficits/detail RLE Sensation: history of peripheral neuropathy LLE Sensation: history of peripheral neuropathy       Communication   Communication: No difficulties  Cognition Arousal/Alertness: Awake/alert Behavior During Therapy: Flat affect Overall Cognitive Status: Within Functional Limits for tasks assessed  General Comments      Exercises     Assessment/Plan    PT Assessment Patient needs continued PT services  PT Problem List Decreased strength;Decreased activity tolerance;Decreased balance;Decreased knowledge of use of DME;Decreased mobility       PT Treatment Interventions DME instruction;Therapeutic exercise;Gait training;Balance training;Functional mobility training;Therapeutic activities;Patient/family education    PT Goals (Current goals can be found in the Care Plan section)  Acute Rehab PT Goals PT Goal Formulation: With patient Time For Goal  Achievement: 11/24/19 Potential to Achieve Goals: Fair    Frequency Min 3X/week   Barriers to discharge        Co-evaluation               AM-PAC PT "6 Clicks" Mobility  Outcome Measure Help needed turning from your back to your side while in a flat bed without using bedrails?: A Lot Help needed moving from lying on your back to sitting on the side of a flat bed without using bedrails?: A Lot Help needed moving to and from a bed to a chair (including a wheelchair)?: A Lot Help needed standing up from a chair using your arms (e.g., wheelchair or bedside chair)?: A Little Help needed to walk in hospital room?: A Little Help needed climbing 3-5 steps with a railing? : A Lot 6 Click Score: 14    End of Session Equipment Utilized During Treatment: Gait belt Activity Tolerance: Patient limited by fatigue Patient left: in chair;with call bell/phone within reach;with chair alarm set   PT Visit Diagnosis: Other abnormalities of gait and mobility (R26.89)    Time: 1125-1150 PT Time Calculation (min) (ACUTE ONLY): 25 min   Charges:   PT Evaluation $PT Eval Low Complexity: 1 Low        Kati PT, DPT Acute Rehabilitation Services Pager: [phone] Office: [phone]  York Ram E 11/10/2019, 12:10 PM

## 2019-11-10 NOTE — Progress Notes (Signed)
ANTICOAGULATION CONSULT NOTE - Follow Up Consult  Pharmacy Consult for Heparin Indication: pulmonary embolus  Allergies  Allergen Reactions  . Codeine Nausea Only  . Duloxetine Other (See Comments)    REACTION: intolerance--She does not remember taking this Rx- states she did not feel well   . Sertraline Hcl Other (See Comments)    REACTION: intolerance-- Did not help with Depression    Patient Measurements: Height: 5\' 5"  (165.1 cm) Weight: 82.1 kg (181 lb) IBW/kg (Calculated) : 57 Heparin Dosing Weight: 75 kg  Vital Signs: Temp: 97.9 F (36.6 C) (08/07 0302) BP: 107/67 (08/07 0302) Pulse Rate: 74 (08/07 0302)  Labs: Recent Labs    11/08/19 1240 11/08/19 1240 11/08/19 1640 11/08/19 2100 11/09/19 0150 11/09/19 0746 11/09/19 1021 11/09/19 1747 11/10/19 0326 11/10/19 0500  HGB 13.1   < >  --   --  11.2*  --   --   --  12.3  --   HCT 41.1  --   --   --  34.4*  --   --   --  38.5  --   PLT 323  --   --   --  300  --   --   --  299  --   APTT  --   --   --  36  --   --   --   --   --   --   LABPROT  --   --   --  16.5*  --   --   --   --   --   --   INR  --   --   --  1.4*  --   --   --   --   --   --   HEPARINUNFRC  --   --   --   --   --  0.76*  --  0.62 0.58  --   CREATININE 2.21*  --   --   --  2.09*  --   --   --  1.64*  --   TROPONINIHS 8  --    < >  --   --   --  10 11  --  9   < > = values in this interval not displayed.    Estimated Creatinine Clearance: 26.5 mL/min (A) (by C-G formula based on SCr of 1.64 mg/dL (H)).   Medications:  Infusions:  . heparin 1,000 Units/hr (11/09/19 1922)    Assessment: 86 yo female presents with chest pain and weakness, VQ reveals likely small PE.  D-dimer 1.18. Pharmacy consulted to dose IV heparin.  Baseline aPTT, INR: minimally elevated  No anticoagulants PTA  Today, 11/10/2019:  Heparin level 0.58, therapeutic on Heparin at 1000 units/hr  CBC Hgb and Plt stable WNL  SCr elevated at baseline,  decreasing  No bleeding or infusion issues reported  Goal of Therapy:  Heparin level 0.3-0.7 units/ml Monitor platelets by anticoagulation protocol: Yes   Plan:   Continue heparin infusion  1000 units/hr  Daily heparin level and CBC  Monitor for s/s bleeding or worsening thrombosis  F/u plans for long-term anticoag   Gretta Arab PharmD, BCPS Clinical Pharmacist [facility] main pharmacy [phone] 11/10/2019 6:57 AM

## 2019-11-10 NOTE — Assessment & Plan Note (Addendum)
-  Patient has been living at home with husband however she appeared extremely weak and deconditioned on admission.  Also likely explains some of her decreased mobility which possibly predisposed her to development of PE -PT consulted -Patient has been recommended for SNF however is still wishing to go home with home health PT if able.  Will place [facility] consult and follow-up the plan.  On 11/11/2019, patient still wishing to go home at time of discharge

## 2019-11-10 NOTE — Progress Notes (Signed)
   11/10/19 1057  Assess: MEWS Score  Temp 98.7 F (37.1 C)  BP (!) 95/49  Pulse Rate (!) 115  Resp 16  Level of Consciousness Alert  SpO2 96 %  O2 Device Room Air  Assess: MEWS Score  MEWS Temp 0  MEWS Systolic 1  MEWS Pulse 2  MEWS RR 0  MEWS LOC 0  MEWS Score 3  MEWS Score Color Yellow  Assess: if the MEWS score is Yellow or Red  Were vital signs taken at a resting state? Yes  Focused Assessment No change from prior assessment  Early Detection of Sepsis Score *See Row Information* Low  MEWS guidelines implemented *See Row Information* Yes  Treat  MEWS Interventions Administered scheduled meds/treatments  Pain Scale 0-10  Pain Score 0  Take Vital Signs  Increase Vital Sign Frequency  Yellow: Q 2hr X 2 then Q 4hr X 2, if remains yellow, continue Q 4hrs  Escalate  MEWS: Escalate Yellow: discuss with charge nurse/RN and consider discussing with provider and RRT  Notify: Charge Nurse/RN  Name of Charge Nurse/RN Notified [place], RN  Date Charge Nurse/RN Notified 11/10/19  Time Charge Nurse/RN Notified 1100  Document  Patient Outcome Stabilized after interventions  Progress note created (see row info) Yes   Pt in the Yellow MEWS d/t HR: 115 and BP: 95/49. Charge RN notified and MD notified. RN holding scheduled Metoprolol and will recheck BP in an hour before administering medication. Will implement Yellow MEWS guidelines. Will continue to monitor closely.

## 2019-11-10 NOTE — Progress Notes (Signed)
PROGRESS NOTE    Krystal Henderson   GBT:[id number]  DOB: Dec 29, 1932  DOA: 11/08/2019     1  PCP: Krystal Held, DO  CC: CP, SOB  Hospital Course: [name] is an 86 yo CF with PMH CKDIV, HTN, neuropathy, B12 deficiency, hyperlipidemia, DJD, diverticulosis, anxiety who presented to the ER with chest pain and shortness of breath.  She described the chest pain is moving across her chest horizontally and has been occurring intermittently for 3 to 4 days prior to presenting. She was lethargic appearing and weak in the ER.  When asked about her physical mobility got home she overestimates stating that she does move around the house cleaning some and being active, however also endorses being extra tired lately and not doing much from the weakness.  Denies any recent long travel trips.  ED Course: [name] was hypoxic requiring 2 L oxygen to maintain sats with chest x-ray showing venous congestion versus pneumonia.  D-dimer was mildly elevated around 1.1 and nuclear scan was done since CT angiogram cannot be done due to her renal function.  Nuclear scan done shows possibility of a filling defect concerning for pulmonary embolism and patient was started on heparin.  EKG shows low voltage.  Otherwise unremarkable.  Patient's labs are significant for creatinine around 0.2 WBC 11.4 high-sensitivity troponin was negative BNP 267.1 D-dimer 1.1.  She was admitted for further trending of her troponins to rule out cardiac etiology of her symptoms.  She will ultimately be transitioned from heparin drip to a DOAC.  Lower extremity duplex and echo also ordered on admission. LE duplex was negative for DVT. Echo was notable for EF 45-50%, Gr 1 DD, and LV global hypokinesis.   On 8/7 she had also converted to afib with rates 100-115. She had no prior history of afib. Trops had been trended and remained negative. Repeat EKG showed afib but no obvious ischemia.  She was recommended to follow up with  cardiology at discharge as well.   She was transitioned from heparin to Eliquis after discussion of anticoagulation options as well as risks/benefits.    Interval History:  Admitted with chest pain and shortness of breath.   This morning she started to feel little better overall.  She is endorsing less chest pain and shortness of breath.  Still remains on oxygen.  Updated her on her remaining test results including echo and lower extremity duplex.  Also discussed anticoagulation options with her.  She is also in atrial fibrillation which she denies any previous history of.  We again discussed etiologies and treatment options going forward.  She has ultimately elected for Eliquis for anticoagulation. PT has also recommended SNF for her and she has endorsed that the wishes to still go home with home health PT if really needed.  Old records reviewed in assessment of this patient  ROS: Constitutional: negative for chills and fevers, Respiratory: positive for Shortness of breath, Cardiovascular: positive for Pleuritic chest pain and Gastrointestinal: negative for abdominal pain  Assessment & Plan: Acute pulmonary embolism ([place]) -Patient endorses substernal chest pain radiating horizontally for 3 to 4 days prior to admission.  She does describe some decreased mobility at home; no significant leg pain endorsed nor swelling.  She has not had any recent surgeries nor traveled long distances in a car etc.  Due to CKD stage IV, unable to undergo CTA, therefore underwent VQ scan.  Probability of PE is not specified but she does appear to  have a filling defect in the left upper lobe.  Report copied below "Small area of decreased perfusion seen along the posterior aspect of the left upper lobe. Sequelae associated with a small pulmonary embolism cannot be excluded. Further evaluation with chest CTA is recommended." -Started on a heparin drip on admission.  Troponins trended and remained negative.  We discussed  options of anticoagulation as well as risks and benefits of each.  Patient has elected to start on Eliquis.  Will ask for pharmacy assistance with dosing given borderline low creatinine clearance as well as her age. -Lower extremity duplex negative for DVT - echo: LV global hypokinesis, EF 40-45%, Gr 1 dd.  This is a reduction in her EF from September 2019 when it was 60%. - no obvious ischemia noted on EKG; will need outpatient cardiology follow-up  Acute respiratory failure with hypoxia ([place]) -Considered due to acute PE.  Still possibility of underlying pneumonia but less likely at this time -Continue O2 and wean if able.  If not, will arrange for home O2  Essential hypertension -Currently low/normal.  If elevates will treat  CKD (chronic kidney disease), stage IV ([facility]) -Currently at baseline. -Current function precludes use of contrasted CT  Peripheral neuropathy, idiopathic -Continue Lyrica  AF (paroxysmal atrial fibrillation) ([place]) -Patient denies any prior history of similar.  She is rhythm unaware -Admission EKG was ambiguous with lots of motion artifact.  Repeat EKG on morning of 11/10/2019 reveals A. fib, rate is around 110 -Start on oral Lopressor at low-dose and will titrate as needed -Already on anticoagulation in setting of PE -Will refer patient to cardiology at discharge for further follow-up  Impaired mobility and ADLs -Patient has been living at home with husband however she appeared extremely weak and deconditioned on admission.  Also likely explains some of her decreased mobility which possibly predisposed her to development of PE -PT consulted -Patient has been recommended for SNF however is still wishing to go home with home health PT if able.  Will [facility] consult and follow-up the plan   Antimicrobials: None  DVT prophylaxis: Heparin drip>> transition to Eliquis Code Status: Full Family Communication: None present Disposition Plan:  Status is:  Observation  The patient remains OBS appropriate and will d/c before 2 midnights.  Dispo: The patient is from: Home              Anticipated d/c is to: Home              Anticipated d/c date is: 1 day              Patient currently is not medically stable to d/c.  Objective: Blood pressure (!) 95/51, pulse (!) 101, temperature (!) 97.4 F (36.3 C), temperature source Oral, resp. rate 16, height 5\' 5"  (1.651 m), weight 82.1 kg, SpO2 95 %.  Examination: General appearance: alert, cooperative and no distress Head: Normocephalic, without obvious abnormality, atraumatic Eyes: EOMI Lungs: clear to auscultation bilaterally Heart: irregularly irregular rhythm and S1, S2 normal Abdomen: normal findings: bowel sounds normal and soft, non-tender Extremities: No edema Skin: mobility and turgor normal Neurologic: Grossly normal  Consultants:  -None  Procedures:   11/08/2019: VQ scan  11/09/2019: Lower extremity duplex.  Negative for DVT bilaterally  11/09/2019: TTE.  See report below  Data Reviewed: I have personally reviewed following labs and imaging studies Results for orders placed or performed during the hospital encounter of 11/08/19 (from the past 24 hour(s))  Heparin level (unfractionated)  Status: None   Collection Time: 11/09/19  5:47 PM  Result Value Ref Range   Heparin Unfractionated 0.62 0.30 - 0.70 IU/mL  Troponin I (High Sensitivity)     Status: None   Collection Time: 11/09/19  5:47 PM  Result Value Ref Range   Troponin I (High Sensitivity) 11 <18 ng/L  CBC     Status: None   Collection Time: 11/10/19  3:26 AM  Result Value Ref Range   WBC 7.9 4.0 - 10.5 K/uL   RBC 4.00 3.87 - 5.11 MIL/uL   Hemoglobin 12.3 12.0 - 15.0 g/dL   HCT 38.5 36 - 46 %   MCV 96.3 80.0 - 100.0 fL   MCH 30.8 26.0 - 34.0 pg   MCHC 31.9 30.0 - 36.0 g/dL   RDW 15.5 11.5 - 15.5 %   Platelets 299 150 - 400 K/uL   nRBC 0.0 0.0 - 0.2 %  Basic metabolic panel     Status: Abnormal   Collection  Time: 11/10/19  3:26 AM  Result Value Ref Range   Sodium 136 135 - 145 mmol/L   Potassium 4.8 3.5 - 5.1 mmol/L   Chloride 104 98 - 111 mmol/L   CO2 23 22 - 32 mmol/L   Glucose, Bld 88 70 - 99 mg/dL   BUN 31 (H) 8 - 23 mg/dL   Creatinine, Ser 1.64 (H) 0.44 - 1.00 mg/dL   Calcium 9.1 8.9 - 10.3 mg/dL   GFR calc non Af Amer 28 (L) >60 mL/min   GFR calc Af Amer 33 (L) >60 mL/min   Anion gap 9 5 - 15  Magnesium     Status: None   Collection Time: 11/10/19  3:26 AM  Result Value Ref Range   Magnesium 2.3 1.7 - 2.4 mg/dL  Heparin level (unfractionated)     Status: None   Collection Time: 11/10/19  3:26 AM  Result Value Ref Range   Heparin Unfractionated 0.58 0.30 - 0.70 IU/mL  Troponin I (High Sensitivity)     Status: None   Collection Time: 11/10/19  5:00 AM  Result Value Ref Range   Troponin I (High Sensitivity) 9 <18 ng/L    Recent Results (from the past 240 hour(s))  SARS Coronavirus 2 by RT PCR (hospital order, performed in [facility] hospital lab) Nasopharyngeal Nasopharyngeal Swab     Status: None   Collection Time: 11/08/19  9:14 PM   Specimen: Nasopharyngeal Swab  Result Value Ref Range Status   SARS Coronavirus 2 NEGATIVE NEGATIVE Final    Comment: (NOTE) SARS-CoV-2 target nucleic acids are NOT DETECTED.  The SARS-CoV-2 RNA is generally detectable in upper and lower respiratory specimens during the acute phase of infection. The lowest concentration of SARS-CoV-2 viral copies this assay can detect is 250 copies / mL. A negative result does not preclude SARS-CoV-2 infection and should not be used as the sole basis for treatment or other patient management decisions.  A negative result may occur with improper specimen collection / handling, submission of specimen other than nasopharyngeal swab, presence of viral mutation(s) within the areas targeted by this assay, and inadequate number of viral copies (<250 copies / mL). A negative result must be combined with  clinical observations, patient history, and epidemiological information.  Fact Sheet for Patients:   StrictlyIdeas.no  Fact Sheet for Healthcare Providers: BankingDealers.co.za  This test is not yet approved or  cleared by the [place] FDA and has been authorized for detection and/or diagnosis of SARS-CoV-2  by FDA under an Emergency Use Authorization (EUA).  This EUA will remain in effect (meaning this test can be used) for the duration of the COVID-19 declaration under Section 564(b)(1) of the Act, 21 U.S.C. section 360bbb-3(b)(1), unless the authorization is terminated or revoked sooner.  Performed at [facility], [place] [address] [place], [place] [address]      Radiology Studies: NM Pulmonary Perfusion  Result Date: 11/08/2019 CLINICAL DATA:  Shortness of breath and elevated D-dimer. EXAM: NUCLEAR MEDICINE PERFUSION LUNG SCAN TECHNIQUE: Perfusion images were obtained in multiple projections after intravenous injection of radiopharmaceutical. Ventilation scans intentionally deferred if perfusion scan and chest x-ray adequate for interpretation during COVID 19 epidemic. RADIOPHARMACEUTICALS:  4.2 mCi Tc-75m MAA IV COMPARISON:  Chest plain film, dated November 08, 2019, is available for comparison. FINDINGS: A small area of nonsegmental decreased perfusion is seen along the posterior aspect of the upper left lung. This is best seen on the posterior and left lateral views. No corresponding abnormalities are seen within this region on the recent chest plain film. Homogeneous distribution of tracer uptake is noted throughout the remainder of both lungs. IMPRESSION: 1. Small area of decreased perfusion seen along the posterior aspect of the left upper lobe. Sequelae associated with a small pulmonary embolism cannot be excluded. Further evaluation with chest CTA is recommended. Electronically Signed   By: Virgina Norfolk M.D.    On: 11/08/2019 19:55   ECHOCARDIOGRAM COMPLETE  Result Date: 11/09/2019    ECHOCARDIOGRAM REPORT   Patient Name:   Krystal Henderson Date of Exam: 11/09/2019 Medical Rec #:  [id number]         Height:       65.0 in Accession #:    [id number]        Weight:       185.0 lb Date of Birth:  Dec 31, 1932          BSA:          1.914 m Patient Age:    6 years          BP:           122/77 mmHg Patient Gender: F                 HR:           73 bpm. Exam Location:  Inpatient Procedure: 2D Echo, Color Doppler and Cardiac Doppler Indications:    I26.02 Pulmonary embolus  History:        Patient has no prior history of Echocardiogram examinations.                 Risk Factors:Hypertension and Dyslipidemia.  Sonographer:    Raquel Sarna Senior RDCS Referring Phys: [place]  Sonographer Comments: Technically difficult study due to poor echo windows and hiatal hernia. IMPRESSIONS  1. Mild global reduction in LV systolic function; grade 1 diastolic dysfunction; right heart not well visualized.  2. Left ventricular ejection fraction, by estimation, is 45 to 50%. The left ventricle has mildly decreased function. The left ventricle demonstrates global hypokinesis. Left ventricular diastolic parameters are consistent with Grade I diastolic dysfunction (impaired relaxation). Elevated left atrial pressure.  3. Right ventricular systolic function is normal. The right ventricular size is normal. Tricuspid regurgitation signal is inadequate for assessing PA pressure.  4. Left atrial size was moderately dilated.  5. The mitral valve is normal in structure. Mild mitral valve regurgitation. No evidence of mitral stenosis.  6. The aortic  valve is tricuspid. Aortic valve regurgitation is mild. No aortic stenosis is present.  7. Aortic dilatation noted. There is mild dilatation of the aortic root measuring 39 mm.  8. The inferior vena cava is normal in size with greater than 50% respiratory variability, suggesting right atrial pressure  of 3 mmHg. FINDINGS  Left Ventricle: Left ventricular ejection fraction, by estimation, is 45 to 50%. The left ventricle has mildly decreased function. The left ventricle demonstrates global hypokinesis. The left ventricular internal cavity size was normal in size. There is  no left ventricular hypertrophy. Left ventricular diastolic function could not be evaluated due to atrial fibrillation. Left ventricular diastolic parameters are consistent with Grade I diastolic dysfunction (impaired relaxation). Elevated left atrial pressure. Right Ventricle: The right ventricular size is normal.Right ventricular systolic function is normal. Tricuspid regurgitation signal is inadequate for assessing PA pressure. The tricuspid regurgitant velocity is 1.95 m/s, and with an assumed right atrial pressure of 3 mmHg, the estimated right ventricular systolic pressure is 76.7 mmHg. Left Atrium: Left atrial size was moderately dilated. Right Atrium: Right atrial size was normal in size. Pericardium: There is no evidence of pericardial effusion. Mitral Valve: The mitral valve is normal in structure. Normal mobility of the mitral valve leaflets. Mild mitral annular calcification. Mild mitral valve regurgitation. No evidence of mitral valve stenosis. Tricuspid Valve: The tricuspid valve is normal in structure. Tricuspid valve regurgitation is trivial. No evidence of tricuspid stenosis. Aortic Valve: The aortic valve is tricuspid. Aortic valve regurgitation is mild. No aortic stenosis is present. Pulmonic Valve: The pulmonic valve was normal in structure. Pulmonic valve regurgitation is trivial. No evidence of pulmonic stenosis. Aorta: Aortic dilatation noted. There is mild dilatation of the aortic root measuring 39 mm. Venous: The inferior vena cava is normal in size with greater than 50% respiratory variability, suggesting right atrial pressure of 3 mmHg.  Additional Comments: Mild global reduction in LV systolic function; grade 1  diastolic dysfunction; right heart not well visualized.  LEFT VENTRICLE PLAX 2D LVIDd:         4.97 cm     Diastology LVIDs:         2.90 cm     LV e' lateral:   8.38 cm/s LV PW:         0.82 cm     LV E/e' lateral: 9.0 LV IVS:        1.00 cm     LV e' medial:    5.00 cm/s LVOT diam:     2.10 cm     LV E/e' medial:  15.1 LV SV:         62 LV SV Index:   32 LVOT Area:     3.46 cm  LV Volumes (MOD) LV vol d, MOD A4C: 95.0 ml LV vol s, MOD A4C: 44.2 ml LV SV MOD A4C:     95.0 ml RIGHT VENTRICLE RV S prime:     12.00 cm/s TAPSE (M-mode): 2.1 cm LEFT ATRIUM             Index       RIGHT ATRIUM           Index LA diam:        4.30 cm 2.25 cm/m  RA Area:     16.30 cm LA Vol (A2C):   97.4 ml 50.90 ml/m RA Volume:   41.00 ml  21.42 ml/m LA Vol (A4C):   89.1 ml 46.56 ml/m LA Biplane Vol: 98.8 ml  51.63 ml/m  AORTIC VALVE LVOT Vmax:   76.10 cm/s LVOT Vmean:  57.300 cm/s LVOT VTI:    0.179 m  AORTA Ao Root diam: 3.40 cm Ao Asc diam:  3.85 cm MITRAL VALVE               TRICUSPID VALVE MV Area (PHT): 3.72 cm    TR Peak grad:   15.2 mmHg MV Decel Time: 204 msec    TR Vmax:        195.00 cm/s MV E velocity: 75.70 cm/s MV A velocity: 86.30 cm/s  SHUNTS MV E/A ratio:  0.88        Systemic VTI:  0.18 m                            Systemic Diam: 2.10 cm Kirk Ruths MD Electronically signed by Kirk Ruths MD Signature Date/Time: 11/09/2019/2:53:34 PM    Final    VAS [place] LOWER EXTREMITY VENOUS (DVT)  Result Date: 11/10/2019  Lower Venous DVTStudy Indications: Pulmonary embolism.  Limitations: Poor ultrasound/tissue interface. Comparison Study: No prior study Performing Technologist: Maudry Mayhew MHA, RDMS, RVT, RDCS  Examination Guidelines: A complete evaluation includes B-mode imaging, spectral Doppler, color Doppler, and power Doppler as needed of all accessible portions of each vessel. Bilateral testing is considered an integral part of a complete examination. Limited examinations for reoccurring indications may be  performed as noted. The reflux portion of the exam is performed with the patient in reverse Trendelenburg.  +---------+---------------+---------+-----------+----------+--------------+ RIGHT    CompressibilityPhasicitySpontaneityPropertiesThrombus Aging +---------+---------------+---------+-----------+----------+--------------+ CFV      Full           Yes      Yes                                 +---------+---------------+---------+-----------+----------+--------------+ SFJ      Full                                                        +---------+---------------+---------+-----------+----------+--------------+ FV Prox  Full                                                        +---------+---------------+---------+-----------+----------+--------------+ FV Mid   Full                                                        +---------+---------------+---------+-----------+----------+--------------+ FV DistalFull                                                        +---------+---------------+---------+-----------+----------+--------------+ PFV      Full                                                        +---------+---------------+---------+-----------+----------+--------------+  POP      Full           Yes      Yes                                 +---------+---------------+---------+-----------+----------+--------------+ PTV      Full                                                        +---------+---------------+---------+-----------+----------+--------------+ PERO     Full                                                        +---------+---------------+---------+-----------+----------+--------------+   +---------+---------------+---------+-----------+----------+--------------+ LEFT     CompressibilityPhasicitySpontaneityPropertiesThrombus Aging +---------+---------------+---------+-----------+----------+--------------+ CFV      Full            Yes      Yes                                 +---------+---------------+---------+-----------+----------+--------------+ SFJ      Full                                                        +---------+---------------+---------+-----------+----------+--------------+ FV Prox  Full                                                        +---------+---------------+---------+-----------+----------+--------------+ FV Mid   Full                                                        +---------+---------------+---------+-----------+----------+--------------+ FV DistalFull                                                        +---------+---------------+---------+-----------+----------+--------------+ PFV      Full                                                        +---------+---------------+---------+-----------+----------+--------------+ POP      Full           Yes      Yes                                 +---------+---------------+---------+-----------+----------+--------------+  PTV      Full                                                        +---------+---------------+---------+-----------+----------+--------------+ PERO     Full                                                        +---------+---------------+---------+-----------+----------+--------------+     Summary: RIGHT: - There is no evidence of deep vein thrombosis in the lower extremity.  - No cystic structure found in the popliteal fossa.  LEFT: - There is no evidence of deep vein thrombosis in the lower extremity.  - No cystic structure found in the popliteal fossa.  *See table(s) above for measurements and observations. Electronically signed by Harold Barban MD on 11/10/2019 at 1:17:17 AM.    Final    VAS [place] LOWER EXTREMITY VENOUS (DVT)  Final Result    NM Pulmonary Perfusion  Final Result    DG Chest 2 View  Final Result       Scheduled Meds: . apixaban  10 mg Oral  BID   Followed by  . [START ON 11/17/2019] apixaban  5 mg Oral BID  . vitamin C  1,000 mg Oral Daily  . aspirin EC  81 mg Oral QPM  . metoprolol tartrate  12.5 mg Oral BID  . nortriptyline  10 mg Oral QHS  . polyethylene glycol  17 g Oral Daily  . pregabalin  125 mg Oral QHS   PRN Meds: ALPRAZolam, HYDROcodone-acetaminophen, ondansetron **OR** ondansetron (ZOFRAN) IV Continuous Infusions:     LOS: 1 day  Time spent: Greater than 50% of the 35 minute visit was spent in counseling/coordination of care for the patient as laid out in the A&P.   Dwyane Dee, MD [facility] 11/10/2019, 2:49 PM   Contact via secure chat.  To contact the attending provider between 7A-7P or the covering provider during after hours 7P-7A, please log into the web site [email or web address] and access using universal [place] password for that web site. If you do not have the password, please call the hospital operator.

## 2019-11-10 NOTE — Assessment & Plan Note (Addendum)
-  Patient denies any prior history of similar.  She is rhythm unaware -Admission EKG was ambiguous with lots of motion artifact.  Repeat EKG on morning of 11/10/2019 reveals A. fib, rate is around 110 -Start on oral Lopressor at low-dose and will titrate as needed -BP not tolerating initiation of low-dose Lopressor on 11/10/2019.  Will discontinue again at this time and monitor -Already on anticoagulation in setting of PE -Will refer patient to cardiology at discharge for further follow-up

## 2019-11-11 DIAGNOSIS — Z7409 Other reduced mobility: Secondary | ICD-10-CM

## 2019-11-11 DIAGNOSIS — Z789 Other specified health status: Secondary | ICD-10-CM

## 2019-11-11 LAB — MAGNESIUM: Magnesium: 2.1 mg/dL (ref 1.7–2.4)

## 2019-11-11 LAB — BASIC METABOLIC PANEL
Anion gap: 9 (ref 5–15)
BUN: 37 mg/dL — ABNORMAL HIGH (ref 8–23)
CO2: 22 mmol/L (ref 22–32)
Calcium: 8.7 mg/dL — ABNORMAL LOW (ref 8.9–10.3)
Chloride: 101 mmol/L (ref 98–111)
Creatinine, Ser: 1.87 mg/dL — ABNORMAL HIGH (ref 0.44–1.00)
GFR calc Af Amer: 28 mL/min — ABNORMAL LOW (ref >60)
GFR calc non Af Amer: 24 mL/min — ABNORMAL LOW (ref >60)
Glucose, Bld: 95 mg/dL (ref 70–99)
Potassium: 4.9 mmol/L (ref 3.5–5.1)
Sodium: 132 mmol/L — ABNORMAL LOW (ref 135–145)

## 2019-11-11 LAB — CBC
HCT: 35.5 % — ABNORMAL LOW (ref 36.0–46.0)
Hemoglobin: 11.6 g/dL — ABNORMAL LOW (ref 12.0–15.0)
MCH: 30.9 pg (ref 26.0–34.0)
MCHC: 32.7 g/dL (ref 30.0–36.0)
MCV: 94.7 fL (ref 80.0–100.0)
Platelets: 307 10*3/uL (ref 150–400)
RBC: 3.75 MIL/uL — ABNORMAL LOW (ref 3.87–5.11)
RDW: 15.6 % — ABNORMAL HIGH (ref 11.5–15.5)
WBC: 7.1 10*3/uL (ref 4.0–10.5)
nRBC: 0 % (ref 0.0–0.2)

## 2019-11-11 NOTE — Progress Notes (Signed)
PROGRESS NOTE    Krystal Henderson   ZOX:[id number]  DOB: [birth year]  DOA: 11/08/2019     2  PCP: Ann Held, DO  CC: CP, SOB  Hospital Course: Krystal Henderson is an 86 yo CF with PMH CKDIV, HTN, neuropathy, B12 deficiency, hyperlipidemia, DJD, diverticulosis, anxiety who presented to the ER with chest pain and shortness of breath.  She described the chest pain is moving across her chest horizontally and has been occurring intermittently for 3 to 4 days prior to presenting. She was lethargic appearing and weak in the ER.  When asked about her physical mobility got home she overestimates stating that she does move around the house cleaning some and being active, however also endorses being extra tired lately and not doing much from the weakness.  Denies any recent long travel trips.  ED Course: [name] was hypoxic requiring 2 L oxygen to maintain sats with chest x-ray showing venous congestion versus pneumonia.  D-dimer was mildly elevated around 1.1 and nuclear scan was done since CT angiogram cannot be done due to her renal function.  Nuclear scan done shows possibility of a filling defect concerning for pulmonary embolism and patient was started on heparin.  EKG shows low voltage.  Otherwise unremarkable.  Patient's labs are significant for creatinine around 0.2 WBC 11.4 high-sensitivity troponin was negative BNP 267.1 D-dimer 1.1.  She was admitted for further trending of her troponins to rule out cardiac etiology of her symptoms.  She will ultimately be transitioned from heparin drip to a DOAC.  Lower extremity duplex and echo also ordered on admission. LE duplex was negative for DVT. Echo was notable for EF 45-50%, Gr 1 DD, and LV global hypokinesis.   On 8/7 she had also converted to afib with rates 100-115. She had no prior history of afib. Trops had been trended and remained negative. Repeat EKG showed afib but no obvious ischemia.  She was recommended to follow up with  cardiology at discharge as well.   She was transitioned from heparin to Eliquis after discussion of anticoagulation options as well as risks/benefits.    Interval History:  Admitted with chest pain and shortness of breath.   No events overnight.  She did become hypotensive and morning dose of Lopressor is held.  Discussed PT recommendations for SNF, however she is declining and still wishes to go home at discharge.  Old records reviewed in assessment of this patient  ROS: Constitutional: negative for chills and fevers, Respiratory: positive for Shortness of breath, Cardiovascular: positive for Pleuritic chest pain and Gastrointestinal: negative for abdominal pain  Assessment & Plan: Acute pulmonary embolism ([place]) -Patient endorses substernal chest pain radiating horizontally for 3 to 4 days prior to admission.  She does describe some decreased mobility at home; no significant leg pain endorsed nor swelling.  She has not had any recent surgeries nor traveled long distances in a car etc.  Due to CKD stage IV, unable to undergo CTA, therefore underwent VQ scan.  Probability of PE is not specified but she does appear to have a filling defect in the left upper lobe.  Report copied below "Small area of decreased perfusion seen along the posterior aspect of the left upper lobe. Sequelae associated with a small pulmonary embolism cannot be excluded. Further evaluation with chest CTA is recommended." -Started on a heparin drip on admission.  Troponins trended and remained negative.  We discussed options of anticoagulation as well as risks and benefits of  each.  Patient has elected to start on Eliquis.  Will ask for pharmacy assistance with dosing given borderline low creatinine clearance as well as her age. -Lower extremity duplex negative for DVT - echo: LV global hypokinesis, EF 40-45%, Gr 1 dd.  This is a reduction in her EF from September 2019 when it was 60%. - no obvious ischemia noted on EKG; will  need outpatient cardiology follow-up  Acute respiratory failure with hypoxia ([place]) -Considered due to acute PE.  Still possibility of underlying pneumonia but less likely at this time -Continue O2 and wean if able.  If not, will arrange for home O2  Essential hypertension -Currently low/normal.  If elevates will treat  CKD (chronic kidney disease), stage IV ([facility]) -Currently at baseline. -Current function precludes use of contrasted CT  Peripheral neuropathy, idiopathic -Continue Lyrica  AF (paroxysmal atrial fibrillation) ([place]) -Patient denies any prior history of similar.  She is rhythm unaware -Admission EKG was ambiguous with lots of motion artifact.  Repeat EKG on morning of 11/10/2019 reveals A. fib, rate is around 110 -Start on oral Lopressor at low-dose and will titrate as needed -BP not tolerating initiation of low-dose Lopressor on 11/10/2019.  Will discontinue again at this time and monitor -Already on anticoagulation in setting of PE -Will refer patient to cardiology at discharge for further follow-up  Impaired mobility and ADLs -Patient has been living at home with husband however she appeared extremely weak and deconditioned on admission.  Also likely explains some of her decreased mobility which possibly predisposed her to development of PE -PT consulted -Patient has been recommended for SNF however is still wishing to go home with home health PT if able.  Will place [facility] consult and follow-up the plan.  On 11/11/2019, patient still wishing to go home at time of discharge   Antimicrobials: None  DVT prophylaxis: Heparin drip>> transitioned to Eliquis Code Status: Full Family Communication: None present Disposition Plan:  Status is: Observation  The patient remains OBS appropriate and will d/c before 2 midnights.  Dispo: The patient is from: Home              Anticipated d/c is to: Home              Anticipated d/c date is: 1 day              Patient currently is not  medically stable to d/c.  Objective: Blood pressure (!) 91/59, pulse 75, temperature 97.9 F (36.6 C), resp. rate 16, height 5\' 5"  (1.651 m), weight 82.1 kg, SpO2 93 %.  Examination: General appearance: alert, cooperative, no distress and slowed mentation Head: Normocephalic, without obvious abnormality, atraumatic Eyes: EOMI Lungs: clear to auscultation bilaterally Heart: irregularly irregular rhythm and S1, S2 normal Abdomen: normal findings: bowel sounds normal and soft, non-tender Extremities: No edema Skin: mobility and turgor normal Neurologic: Grossly normal  Consultants:  -None  Procedures:   11/08/2019: VQ scan  11/09/2019: Lower extremity duplex.  Negative for DVT bilaterally  11/09/2019: TTE.  See report below  Data Reviewed: I have personally reviewed following labs and imaging studies Results for orders placed or performed during the hospital encounter of 11/08/19 (from the past 24 hour(s))  CBC     Status: Abnormal   Collection Time: 11/11/19  3:04 AM  Result Value Ref Range   WBC 7.1 4.0 - 10.5 K/uL   RBC 3.75 (L) 3.87 - 5.11 MIL/uL   Hemoglobin 11.6 (L) 12.0 - 15.0 g/dL  HCT 35.5 (L) 36 - 46 %   MCV 94.7 80.0 - 100.0 fL   MCH 30.9 26.0 - 34.0 pg   MCHC 32.7 30.0 - 36.0 g/dL   RDW 15.6 (H) 11.5 - 15.5 %   Platelets 307 150 - 400 K/uL   nRBC 0.0 0.0 - 0.2 %  Basic metabolic panel     Status: Abnormal   Collection Time: 11/11/19  3:04 AM  Result Value Ref Range   Sodium 132 (L) 135 - 145 mmol/L   Potassium 4.9 3.5 - 5.1 mmol/L   Chloride 101 98 - 111 mmol/L   CO2 22 22 - 32 mmol/L   Glucose, Bld 95 70 - 99 mg/dL   BUN 37 (H) 8 - 23 mg/dL   Creatinine, Ser 1.87 (H) 0.44 - 1.00 mg/dL   Calcium 8.7 (L) 8.9 - 10.3 mg/dL   GFR calc non Af Amer 24 (L) >60 mL/min   GFR calc Af Amer 28 (L) >60 mL/min   Anion gap 9 5 - 15  Magnesium     Status: None   Collection Time: 11/11/19  3:04 AM  Result Value Ref Range   Magnesium 2.1 1.7 - 2.4 mg/dL    Recent  Results (from the past 240 hour(s))  SARS Coronavirus 2 by RT PCR (hospital order, performed in [place] hospital lab) Nasopharyngeal Nasopharyngeal Swab     Status: None   Collection Time: 11/08/19  9:14 PM   Specimen: Nasopharyngeal Swab  Result Value Ref Range Status   SARS Coronavirus 2 NEGATIVE NEGATIVE Final    Comment: (NOTE) SARS-CoV-2 target nucleic acids are NOT DETECTED.  The SARS-CoV-2 RNA is generally detectable in upper and lower respiratory specimens during the acute phase of infection. The lowest concentration of SARS-CoV-2 viral copies this assay can detect is 250 copies / mL. A negative result does not preclude SARS-CoV-2 infection and should not be used as the sole basis for treatment or other patient management decisions.  A negative result may occur with improper specimen collection / handling, submission of specimen other than nasopharyngeal swab, presence of viral mutation(s) within the areas targeted by this assay, and inadequate number of viral copies (<250 copies / mL). A negative result must be combined with clinical observations, patient history, and epidemiological information.  Fact Sheet for Patients:   StrictlyIdeas.no  Fact Sheet for Healthcare Providers: BankingDealers.co.za  This test is not yet approved or  cleared by the [place] FDA and has been authorized for detection and/or diagnosis of SARS-CoV-2 by FDA under an Emergency Use Authorization (EUA).  This EUA will remain in effect (meaning this test can be used) for the duration of the COVID-19 declaration under Section 564(b)(1) of the Act, 21 U.S.C. section 360bbb-3(b)(1), unless the authorization is terminated or revoked sooner.  Performed at [facility], [place] [address], [place], [place] [address]      Radiology Studies: ECHOCARDIOGRAM COMPLETE  Result Date: 11/09/2019    ECHOCARDIOGRAM REPORT   Patient Name:    Krystal Henderson Date of Exam: 11/09/2019 Medical Rec #:  [id number]         Height:       65.0 in Accession #:    [id number]        Weight:       185.0 lb Date of Birth:  [birth year]          BSA:          1.914 m Patient Age:  87 years          BP:           122/77 mmHg Patient Gender: F                 HR:           73 bpm. Exam Location:  Inpatient Procedure: 2D Echo, Color Doppler and Cardiac Doppler Indications:    I26.02 Pulmonary embolus  History:        Patient has no prior history of Echocardiogram examinations.                 Risk Factors:Hypertension and Dyslipidemia.  Sonographer:    Raquel Sarna Senior RDCS Referring Phys: [place]  Sonographer Comments: Technically difficult study due to poor echo windows and hiatal hernia. IMPRESSIONS  1. Mild global reduction in LV systolic function; grade 1 diastolic dysfunction; right heart not well visualized.  2. Left ventricular ejection fraction, by estimation, is 45 to 50%. The left ventricle has mildly decreased function. The left ventricle demonstrates global hypokinesis. Left ventricular diastolic parameters are consistent with Grade I diastolic dysfunction (impaired relaxation). Elevated left atrial pressure.  3. Right ventricular systolic function is normal. The right ventricular size is normal. Tricuspid regurgitation signal is inadequate for assessing PA pressure.  4. Left atrial size was moderately dilated.  5. The mitral valve is normal in structure. Mild mitral valve regurgitation. No evidence of mitral stenosis.  6. The aortic valve is tricuspid. Aortic valve regurgitation is mild. No aortic stenosis is present.  7. Aortic dilatation noted. There is mild dilatation of the aortic root measuring 39 mm.  8. The inferior vena cava is normal in size with greater than 50% respiratory variability, suggesting right atrial pressure of 3 mmHg. FINDINGS  Left Ventricle: Left ventricular ejection fraction, by estimation, is 45 to 50%. The left  ventricle has mildly decreased function. The left ventricle demonstrates global hypokinesis. The left ventricular internal cavity size was normal in size. There is  no left ventricular hypertrophy. Left ventricular diastolic function could not be evaluated due to atrial fibrillation. Left ventricular diastolic parameters are consistent with Grade I diastolic dysfunction (impaired relaxation). Elevated left atrial pressure. Right Ventricle: The right ventricular size is normal.Right ventricular systolic function is normal. Tricuspid regurgitation signal is inadequate for assessing PA pressure. The tricuspid regurgitant velocity is 1.95 m/s, and with an assumed right atrial pressure of 3 mmHg, the estimated right ventricular systolic pressure is 40.1 mmHg. Left Atrium: Left atrial size was moderately dilated. Right Atrium: Right atrial size was normal in size. Pericardium: There is no evidence of pericardial effusion. Mitral Valve: The mitral valve is normal in structure. Normal mobility of the mitral valve leaflets. Mild mitral annular calcification. Mild mitral valve regurgitation. No evidence of mitral valve stenosis. Tricuspid Valve: The tricuspid valve is normal in structure. Tricuspid valve regurgitation is trivial. No evidence of tricuspid stenosis. Aortic Valve: The aortic valve is tricuspid. Aortic valve regurgitation is mild. No aortic stenosis is present. Pulmonic Valve: The pulmonic valve was normal in structure. Pulmonic valve regurgitation is trivial. No evidence of pulmonic stenosis. Aorta: Aortic dilatation noted. There is mild dilatation of the aortic root measuring 39 mm. Venous: The inferior vena cava is normal in size with greater than 50% respiratory variability, suggesting right atrial pressure of 3 mmHg.  Additional Comments: Mild global reduction in LV systolic function; grade 1 diastolic dysfunction; right heart not well visualized.  LEFT VENTRICLE PLAX 2D LVIDd:  4.97 cm     Diastology  LVIDs:         2.90 cm     LV e' lateral:   8.38 cm/s LV PW:         0.82 cm     LV E/e' lateral: 9.0 LV IVS:        1.00 cm     LV e' medial:    5.00 cm/s LVOT diam:     2.10 cm     LV E/e' medial:  15.1 LV SV:         62 LV SV Index:   32 LVOT Area:     3.46 cm  LV Volumes (MOD) LV vol d, MOD A4C: 95.0 ml LV vol s, MOD A4C: 44.2 ml LV SV MOD A4C:     95.0 ml RIGHT VENTRICLE RV S prime:     12.00 cm/s TAPSE (M-mode): 2.1 cm LEFT ATRIUM             Index       RIGHT ATRIUM           Index LA diam:        4.30 cm 2.25 cm/m  RA Area:     16.30 cm LA Vol (A2C):   97.4 ml 50.90 ml/m RA Volume:   41.00 ml  21.42 ml/m LA Vol (A4C):   89.1 ml 46.56 ml/m LA Biplane Vol: 98.8 ml 51.63 ml/m  AORTIC VALVE LVOT Vmax:   76.10 cm/s LVOT Vmean:  57.300 cm/s LVOT VTI:    0.179 m  AORTA Ao Root diam: 3.40 cm Ao Asc diam:  3.85 cm MITRAL VALVE               TRICUSPID VALVE MV Area (PHT): 3.72 cm    TR Peak grad:   15.2 mmHg MV Decel Time: 204 msec    TR Vmax:        195.00 cm/s MV E velocity: 75.70 cm/s MV A velocity: 86.30 cm/s  SHUNTS MV E/A ratio:  0.88        Systemic VTI:  0.18 m                            Systemic Diam: 2.10 cm Kirk Ruths MD Electronically signed by Kirk Ruths MD Signature Date/Time: 11/09/2019/2:53:34 PM    Final    VAS [place] LOWER EXTREMITY VENOUS (DVT)  Result Date: 11/10/2019  Lower Venous DVTStudy Indications: Pulmonary embolism.  Limitations: Poor ultrasound/tissue interface. Comparison Study: No prior study Performing Technologist: Maudry Mayhew MHA, RDMS, RVT, RDCS  Examination Guidelines: A complete evaluation includes B-mode imaging, spectral Doppler, color Doppler, and power Doppler as needed of all accessible portions of each vessel. Bilateral testing is considered an integral part of a complete examination. Limited examinations for reoccurring indications may be performed as noted. The reflux portion of the exam is performed with the patient in reverse Trendelenburg.   +---------+---------------+---------+-----------+----------+--------------+ RIGHT    CompressibilityPhasicitySpontaneityPropertiesThrombus Aging +---------+---------------+---------+-----------+----------+--------------+ CFV      Full           Yes      Yes                                 +---------+---------------+---------+-----------+----------+--------------+ SFJ      Full                                                        +---------+---------------+---------+-----------+----------+--------------+  FV Prox  Full                                                        +---------+---------------+---------+-----------+----------+--------------+ FV Mid   Full                                                        +---------+---------------+---------+-----------+----------+--------------+ FV DistalFull                                                        +---------+---------------+---------+-----------+----------+--------------+ PFV      Full                                                        +---------+---------------+---------+-----------+----------+--------------+ POP      Full           Yes      Yes                                 +---------+---------------+---------+-----------+----------+--------------+ PTV      Full                                                        +---------+---------------+---------+-----------+----------+--------------+ PERO     Full                                                        +---------+---------------+---------+-----------+----------+--------------+   +---------+---------------+---------+-----------+----------+--------------+ LEFT     CompressibilityPhasicitySpontaneityPropertiesThrombus Aging +---------+---------------+---------+-----------+----------+--------------+ CFV      Full           Yes      Yes                                  +---------+---------------+---------+-----------+----------+--------------+ SFJ      Full                                                        +---------+---------------+---------+-----------+----------+--------------+ FV Prox  Full                                                        +---------+---------------+---------+-----------+----------+--------------+  FV Mid   Full                                                        +---------+---------------+---------+-----------+----------+--------------+ FV DistalFull                                                        +---------+---------------+---------+-----------+----------+--------------+ PFV      Full                                                        +---------+---------------+---------+-----------+----------+--------------+ POP      Full           Yes      Yes                                 +---------+---------------+---------+-----------+----------+--------------+ PTV      Full                                                        +---------+---------------+---------+-----------+----------+--------------+ PERO     Full                                                        +---------+---------------+---------+-----------+----------+--------------+     Summary: RIGHT: - There is no evidence of deep vein thrombosis in the lower extremity.  - No cystic structure found in the popliteal fossa.  LEFT: - There is no evidence of deep vein thrombosis in the lower extremity.  - No cystic structure found in the popliteal fossa.  *See table(s) above for measurements and observations. Electronically signed by Harold Barban MD on 11/10/2019 at 1:17:17 AM.    Final    VAS [place] LOWER EXTREMITY VENOUS (DVT)  Final Result    NM Pulmonary Perfusion  Final Result    DG Chest 2 View  Final Result       Scheduled Meds: . apixaban  10 mg Oral BID   Followed by  . [START ON 11/17/2019] apixaban  5 mg  Oral BID  . vitamin C  1,000 mg Oral Daily  . aspirin EC  81 mg Oral QPM  . nortriptyline  10 mg Oral QHS  . polyethylene glycol  17 g Oral Daily  . pregabalin  125 mg Oral QHS   PRN Meds: ALPRAZolam, ondansetron **OR** ondansetron (ZOFRAN) IV Continuous Infusions:     LOS: 2 days  Time spent: Greater than 50% of the 35 minute visit was spent in counseling/coordination of care for the patient as laid out in the A&P.   Dwyane Dee, MD [facility] 11/11/2019, 11:49  AM   Contact via secure chat.  To contact the attending provider between 7A-7P or the covering provider during after hours 7P-7A, please log into the web site [email or web address] and access using universal [place] password for that web site. If you do not have the password, please call the hospital operator.

## 2019-11-12 DIAGNOSIS — I5043 Acute on chronic combined systolic (congestive) and diastolic (congestive) heart failure: Secondary | ICD-10-CM

## 2019-11-12 LAB — CBC
HCT: 35.1 % — ABNORMAL LOW (ref 36.0–46.0)
Hemoglobin: 11.4 g/dL — ABNORMAL LOW (ref 12.0–15.0)
MCH: 30.9 pg (ref 26.0–34.0)
MCHC: 32.5 g/dL (ref 30.0–36.0)
MCV: 95.1 fL (ref 80.0–100.0)
Platelets: 325 10*3/uL (ref 150–400)
RBC: 3.69 MIL/uL — ABNORMAL LOW (ref 3.87–5.11)
RDW: 15.4 % (ref 11.5–15.5)
WBC: 6.8 10*3/uL (ref 4.0–10.5)
nRBC: 0 % (ref 0.0–0.2)

## 2019-11-12 LAB — BASIC METABOLIC PANEL
Anion gap: 10 (ref 5–15)
BUN: 38 mg/dL — ABNORMAL HIGH (ref 8–23)
CO2: 22 mmol/L (ref 22–32)
Calcium: 8.6 mg/dL — ABNORMAL LOW (ref 8.9–10.3)
Chloride: 99 mmol/L (ref 98–111)
Creatinine, Ser: 1.79 mg/dL — ABNORMAL HIGH (ref 0.44–1.00)
GFR calc Af Amer: 29 mL/min — ABNORMAL LOW (ref >60)
GFR calc non Af Amer: 25 mL/min — ABNORMAL LOW (ref >60)
Glucose, Bld: 96 mg/dL (ref 70–99)
Potassium: 5 mmol/L (ref 3.5–5.1)
Sodium: 131 mmol/L — ABNORMAL LOW (ref 135–145)

## 2019-11-12 LAB — MAGNESIUM: Magnesium: 2.5 mg/dL — ABNORMAL HIGH (ref 1.7–2.4)

## 2019-11-12 MED ORDER — APIXABAN 5 MG PO TABS: 10.0000 mg | ORAL_TABLET | Freq: Two times a day (BID) | ORAL | 0 refills | Status: DC

## 2019-11-12 MED ORDER — APIXABAN 5 MG PO TABS: 5.0000 mg | ORAL_TABLET | Freq: Two times a day (BID) | ORAL | 3 refills | Status: DC

## 2019-11-12 NOTE — Discharge Summary (Signed)
Physician Discharge Summary  Krystal Henderson KZS:[id number] DOB: Aug 05, 1932 DOA: 11/08/2019  PCP: Ann Held, DO  Admit date: 11/08/2019 Discharge date: 11/12/2019  Admitted From: home Disposition:  Home with H/H Discharging physician: Dwyane Dee, MD  Recommendations for Outpatient Follow-up:  1. Wean O2 as able 2. Continue Eliquis for minimum 3 months  Home Health: Home O2  Patient discharged to home in Discharge Condition: stable CODE STATUS: Full Diet recommendation:  Diet Orders (From admission, onward)    Start     Ordered   11/12/19 0000  Diet - low sodium heart healthy        11/12/19 1306   11/08/19 2126  Diet Heart Room service appropriate? Yes; Fluid consistency: Thin  Diet effective now       Question Answer Comment  Room service appropriate? Yes   Fluid consistency: Thin      11/08/19 2125          Hospital Course: [name] is an 86 yo CF with PMH CKDIV, HTN, neuropathy, B12 deficiency, hyperlipidemia, DJD, diverticulosis, anxiety who presented to the ER with chest pain and shortness of breath.  She described the chest pain is moving across her chest horizontally and has been occurring intermittently for 3 to 4 days prior to presenting. She was lethargic appearing and weak in the ER.  When asked about her physical mobility got home she overestimates stating that she does move around the house cleaning some and being active, however also endorses being extra tired lately and not doing much from the weakness.  Denies any recent long travel trips.  ED Course: [name] was hypoxic requiring 2 L oxygen to maintain sats with chest x-ray showing venous congestion versus pneumonia.  D-dimer was mildly elevated around 1.1 and nuclear scan was done since CT angiogram cannot be done due to her renal function.  Nuclear scan done shows possibility of a filling defect concerning for pulmonary embolism and patient was started on heparin.  EKG shows low voltage.   Otherwise unremarkable.  Patient's labs are significant for creatinine around 0.2 WBC 11.4 high-sensitivity troponin was negative BNP 267.1 D-dimer 1.1.  She was admitted for further trending of her troponins to rule out cardiac etiology of her symptoms.  She will ultimately be transitioned from heparin drip to a DOAC.  Lower extremity duplex and echo also ordered on admission. LE duplex was negative for DVT. Echo was notable for EF 45-50%, Gr 1 DD, and LV global hypokinesis.   On 8/7 she had also converted to afib with rates 100-115. She had no prior history of afib. Trops had been trended and remained negative. Repeat EKG showed afib but no obvious ischemia.  She was recommended to follow up with cardiology at discharge as well.   She was transitioned from heparin to Eliquis after discussion of anticoagulation options as well as risks/benefits.   She ambulated on room air and had desaturation to 86%.  O2 was ordered prior to discharge.   Acute pulmonary embolism ([place]) -Patient endorses substernal chest pain radiating horizontally for 3 to 4 days prior to admission.  She does describe some decreased mobility at home; no significant leg pain endorsed nor swelling.  She has not had any recent surgeries nor traveled long distances in a car etc.  Due to CKD stage IV, unable to undergo CTA, therefore underwent VQ scan.  Probability of PE is not specified but she does appear to have a filling defect in the left  upper lobe.  Report copied below "Small area of decreased perfusion seen along the posterior aspect of the left upper lobe. Sequelae associated with a small pulmonary embolism cannot be excluded. Further evaluation with chest CTA is recommended." -Started on a heparin drip on admission.  Troponins trended and remained negative.  We discussed options of anticoagulation as well as risks and benefits of each.  Patient has elected to start on Eliquis.  Will ask for pharmacy assistance with dosing given  borderline low creatinine clearance as well as her age. -Lower extremity duplex negative for DVT - echo: LV global hypokinesis, EF 40-45%, Gr 1 dd.  This is a reduction in her EF from September 2019 when it was 60%. - no obvious ischemia noted on EKG; will need outpatient cardiology follow-up  Acute respiratory failure with hypoxia ([place]) -Considered due to acute PE.  Still possibility of underlying pneumonia but less likely at this time -Continue O2 and wean if able.  If not, will arrange for home O2  Essential hypertension -Currently low/normal.  If elevates will treat  CKD (chronic kidney disease), stage IV (H[facility]) -Currently at baseline. -Current function precludes use of contrasted CT  Peripheral neuropathy, idiopathic -Continue Lyrica  AF (paroxysmal atrial fibrillation) ([place]) -Patient denies any prior history of similar.  She is rhythm unaware -Admission EKG was ambiguous with lots of motion artifact.  Repeat EKG on morning of 11/10/2019 reveals A. fib, rate is around 110 -Start on oral Lopressor at low-dose and will titrate as needed -BP not tolerating initiation of low-dose Lopressor on 11/10/2019.  Will discontinue again at this time and monitor -Already on anticoagulation in setting of PE -Will refer patient to cardiology at discharge for further follow-up  Impaired mobility and ADLs -Patient has been living at home with husband however she appeared extremely weak and deconditioned on admission.  Also likely explains some of her decreased mobility which possibly predisposed her to development of PE -PT consulted -Patient has been recommended for SNF however is still wishing to go home with home health PT if able.  Will [facility] consult and follow-up the plan.  On 11/11/2019, patient still wishing to go home at time of discharge  Acute on chronic combined systolic and diastolic CHF (congestive heart failure) ([place]) - elevated BNP on admission with new O2 requirement; patient desats  with ambulation -Echo obtained on admission.  EF 45 to 39%, grade 1 diastolic dysfunction.  Mildly reduced EF per report -Continue O2 and will wean as able.  Will require home O2 at discharge    The patient's chronic medical conditions were treated accordingly per the patient's home medication regimen except as noted.  On day of discharge, patient was felt deemed stable for discharge. Patient/family member advised to call PCP or come back to ER if needed.   Discharge Diagnoses:   Principal Diagnosis: Acute respiratory failure with hypoxia [facility])  Active Hospital Problems   Diagnosis Date Noted  . Acute respiratory failure with hypoxia ([place]) 11/08/2019    Priority: Medium  . Acute pulmonary embolism ([place]) 11/08/2019    Priority: High  . AF (paroxysmal atrial fibrillation) ([place]) 11/10/2019    Priority: Medium  . Acute on chronic combined systolic and diastolic CHF (congestive heart failure) ([place]) 11/12/2019  . Impaired mobility and ADLs 11/10/2019  . CKD (chronic kidney disease), stage IV ([place]) 03/28/2013  . Peripheral neuropathy, idiopathic 09/24/2010  . Essential hypertension 01/23/2007    Resolved Hospital Problems  No resolved problems to display.  Discharge Instructions    Diet - low sodium heart healthy   Complete by: As directed    Increase activity slowly   Complete by: As directed      Allergies as of 11/12/2019      Reactions   Codeine Nausea Only   Duloxetine Other (See Comments)   REACTION: intolerance--She does not remember taking this Rx- states she did not feel well    Sertraline Hcl Other (See Comments)   REACTION: intolerance-- Did not help with Depression      Medication List    TAKE these medications   ALPRAZolam 0.25 MG tablet Commonly known as: XANAX take 1 tablet by mouth every 8 to 12 hours if needed What changed:   how much to take  how to take this  when to take this  reasons to take this  additional instructions   amLODipine 5 MG  tablet Commonly known as: NORVASC Take 1 tablet (5 mg total) by mouth daily.   apixaban 5 MG Tabs tablet Commonly known as: ELIQUIS Take 2 tablets (10 mg total) by mouth 2 (two) times daily for 4 days.   apixaban 5 MG Tabs tablet Commonly known as: ELIQUIS Take 1 tablet (5 mg total) by mouth 2 (two) times daily. Start taking on: November 17, 2019   aspirin 81 MG tablet Take 81 mg by mouth every evening.   CALCIUM 600 PO Take 1 capsule by mouth daily.   fluticasone 50 MCG/ACT nasal spray Commonly known as: FLONASE Place 2 sprays into both nostrils daily.   furosemide 20 MG tablet Commonly known as: LASIX TAKE 1 TABLET(20 MG) BY MOUTH DAILY   HYDROcodone-acetaminophen 5-325 MG tablet Commonly known as: NORCO/VICODIN Take 1 tablet by mouth every 6 (six) hours as needed for moderate pain or severe pain.   losartan 100 MG tablet Commonly known as: COZAAR TAKE 1 TABLET BY MOUTH EVERY DAY   Lyrica 25 MG capsule Generic drug: pregabalin Take 1 capsule in the morning What changed:   how much to take  how to take this  when to take this  additional instructions   Lyrica 100 MG capsule Generic drug: pregabalin Take 1 capsule (100 mg total) by mouth at bedtime. What changed: additional instructions   nortriptyline 10 MG capsule Commonly known as: PAMELOR Take 1 capsule (10 mg total) by mouth at bedtime.   polyethylene glycol 17 g packet Commonly known as: MIRALAX / GLYCOLAX Take 17 g by mouth daily.   SYSTANE ULTRA OP Apply 1 drop to eye daily as needed (for dry eyes).   Valtrex 1000 MG tablet Generic drug: valACYclovir Take 1,000 mg by mouth 2 (two) times daily.   vitamin C 1000 MG tablet Take 1,000 mg by mouth daily.   Vitamin D 50 MCG (2000 UT) Caps Take 1 capsule by mouth daily.            Durable Medical Equipment  (From admission, onward)         Start     Ordered   11/12/19 1307  DME Oxygen  Once       Question Answer Comment  Length of  Need 6 Months   Mode or (Route) Nasal cannula   Liters per Minute 2   Oxygen delivery system Gas      11/12/19 1306          Follow-up Information    [facility], [place] Follow up.   Specialty: [place] Why: [facility]  staff will call you to schedule in home physical thearpy visits Contact information: [address] [place] [address] [phone]              Allergies  Allergen Reactions  . Codeine Nausea Only  . Duloxetine Other (See Comments)    REACTION: intolerance--She does not remember taking this Rx- states she did not feel well   . Sertraline Hcl Other (See Comments)    REACTION: intolerance-- Did not help with Depression    Discharge Exam: BP (!) 88/52 (BP Location: Right Arm)   Pulse 85   Temp (!) 97.5 F (36.4 C) (Oral)   Resp 18   Ht 5\' 5"  (1.651 m)   Wt 81.1 kg   SpO2 95%   BMI 29.75 kg/m  General appearance: alert, cooperative, no distress and slowed mentation Head: Normocephalic, without obvious abnormality, atraumatic Eyes: EOMI Lungs: clear to auscultation bilaterally Heart: irregularly irregular rhythm and S1, S2 normal Abdomen: normal findings: bowel sounds normal and soft, non-tender Extremities: No edema Skin: mobility and turgor normal Neurologic: Grossly normal  The results of significant diagnostics from this hospitalization (including imaging, microbiology, ancillary and laboratory) are listed below for reference.   Microbiology: Recent Results (from the past 240 hour(s))  SARS Coronavirus 2 by RT PCR (hospital order, performed in [facility] hospital lab) Nasopharyngeal Nasopharyngeal Swab     Status: None   Collection Time: 11/08/19  9:14 PM   Specimen: Nasopharyngeal Swab  Result Value Ref Range Status   SARS Coronavirus 2 NEGATIVE NEGATIVE Final    Comment: (NOTE) SARS-CoV-2 target nucleic acids are NOT DETECTED.  The SARS-CoV-2 RNA is generally detectable in upper and  lower respiratory specimens during the acute phase of infection. The lowest concentration of SARS-CoV-2 viral copies this assay can detect is 250 copies / mL. A negative result does not preclude SARS-CoV-2 infection and should not be used as the sole basis for treatment or other patient management decisions.  A negative result may occur with improper specimen collection / handling, submission of specimen other than nasopharyngeal swab, presence of viral mutation(s) within the areas targeted by this assay, and inadequate number of viral copies (<250 copies / mL). A negative result must be combined with clinical observations, patient history, and epidemiological information.  Fact Sheet for Patients:   StrictlyIdeas.no  Fact Sheet for Healthcare Providers: BankingDealers.co.za  This test is not yet approved or  cleared by the [place] FDA and has been authorized for detection and/or diagnosis of SARS-CoV-2 by FDA under an Emergency Use Authorization (EUA).  This EUA will remain in effect (meaning this test can be used) for the duration of the COVID-19 declaration under Section 564(b)(1) of the Act, 21 U.S.C. section 360bbb-3(b)(1), unless the authorization is terminated or revoked sooner.  Performed at [facility], [place] [address] [place], [place] [address]      Labs: BNP (last 3 results) Recent Labs    11/08/19 1640  BNP 846.9*   Basic Metabolic Panel: Recent Labs  Lab 11/08/19 1240 11/09/19 0150 11/10/19 0326 11/11/19 0304 11/12/19 0334  NA 138 137 136 132* 131*  K 4.8 4.2 4.8 4.9 5.0  CL 103 103 104 101 99  CO2 26 24 23 22 22   GLUCOSE 94 102* 88 95 96  BUN 32* 36* 31* 37* 38*  CREATININE 2.21* 2.09* 1.64* 1.87* 1.79*  CALCIUM 9.3 8.7* 9.1 8.7* 8.6*  MG  --   --  2.3 2.1 2.5*  Liver Function Tests: No results for input(s): AST, ALT, ALKPHOS, BILITOT, PROT, ALBUMIN in the last 168  hours. No results for input(s): LIPASE, AMYLASE in the last 168 hours. No results for input(s): AMMONIA in the last 168 hours. CBC: Recent Labs  Lab 11/08/19 1240 11/09/19 0150 11/10/19 0326 11/11/19 0304 11/12/19 0334  WBC 11.5* 10.9* 7.9 7.1 6.8  HGB 13.1 11.2* 12.3 11.6* 11.4*  HCT 41.1 34.4* 38.5 35.5* 35.1*  MCV 96.5 95.0 96.3 94.7 95.1  PLT 323 300 299 307 325   Cardiac Enzymes: No results for input(s): CKTOTAL, CKMB, CKMBINDEX, TROPONINI in the last 168 hours. BNP: Invalid input(s): POCBNP CBG: No results for input(s): GLUCAP in the last 168 hours. D-Dimer No results for input(s): DDIMER in the last 72 hours. Hgb A1c No results for input(s): HGBA1C in the last 72 hours. Lipid Profile No results for input(s): CHOL, HDL, LDLCALC, TRIG, CHOLHDL, LDLDIRECT in the last 72 hours. Thyroid function studies No results for input(s): TSH, T4TOTAL, T3FREE, THYROIDAB in the last 72 hours.  Invalid input(s): FREET3 Anemia work up No results for input(s): VITAMINB12, FOLATE, FERRITIN, TIBC, IRON, RETICCTPCT in the last 72 hours. Urinalysis    Component Value Date/Time   COLORURINE YELLOW 11/04/2015 0951   APPEARANCEUR CLEAR 11/04/2015 0951   LABSPEC 1.008 11/04/2015 0951   PHURINE 7.0 11/04/2015 0951   GLUCOSEU NEGATIVE 11/04/2015 0951   HGBUR NEGATIVE 11/04/2015 0951   HGBUR moderate 03/06/2009 0824   BILIRUBINUR neg 12/22/2017 1556   KETONESUR NEGATIVE 11/04/2015 0951   PROTEINUR Negative 12/22/2017 1556   PROTEINUR NEGATIVE 11/04/2015 0951   UROBILINOGEN 0.2 12/22/2017 1556   UROBILINOGEN 0.2 01/01/2012 1633   NITRITE neg 12/22/2017 1556   NITRITE NEGATIVE 11/04/2015 0951   LEUKOCYTESUR Negative 12/22/2017 1556   Sepsis Labs Invalid input(s): PROCALCITONIN,  WBC,  LACTICIDVEN Microbiology Recent Results (from the past 240 hour(s))  SARS Coronavirus 2 by RT PCR (hospital order, performed in [place] hospital lab) Nasopharyngeal Nasopharyngeal Swab     Status:  None   Collection Time: 11/08/19  9:14 PM   Specimen: Nasopharyngeal Swab  Result Value Ref Range Status   SARS Coronavirus 2 NEGATIVE NEGATIVE Final    Comment: (NOTE) SARS-CoV-2 target nucleic acids are NOT DETECTED.  The SARS-CoV-2 RNA is generally detectable in upper and lower respiratory specimens during the acute phase of infection. The lowest concentration of SARS-CoV-2 viral copies this assay can detect is 250 copies / mL. A negative result does not preclude SARS-CoV-2 infection and should not be used as the sole basis for treatment or other patient management decisions.  A negative result may occur with improper specimen collection / handling, submission of specimen other than nasopharyngeal swab, presence of viral mutation(s) within the areas targeted by this assay, and inadequate number of viral copies (<250 copies / mL). A negative result must be combined with clinical observations, patient history, and epidemiological information.  Fact Sheet for Patients:   StrictlyIdeas.no  Fact Sheet for Healthcare Providers: BankingDealers.co.za  This test is not yet approved or  cleared by the [place] FDA and has been authorized for detection and/or diagnosis of SARS-CoV-2 by FDA under an Emergency Use Authorization (EUA).  This EUA will remain in effect (meaning this test can be used) for the duration of the COVID-19 declaration under Section 564(b)(1) of the Act, 21 U.S.C. section 360bbb-3(b)(1), unless the authorization is terminated or revoked sooner.  Performed at [facility], [place] [address] [place], [place] [address]  Procedures/Studies: DG Chest 2 View  Result Date: 11/08/2019 CLINICAL DATA:  Chest pain. EXAM: CHEST - 2 VIEW COMPARISON:  07/22/2015. FINDINGS: Cardiomegaly with mild pulmonary venous congestion. Right upper lobe infiltrate/edema. No pleural effusion or pneumothorax. Sliding  hiatal hernia. No acute bony abnormality. IMPRESSION: 1.  Cardiomegaly with pulmonary venous congestion. 2. Right upper lobe infiltrate/edema. Right upper lobe pneumonia cannot be excluded. 3.  Sliding hiatal hernia. Electronically Signed   By: Marcello Moores  Register   On: 11/08/2019 13:44   NM Pulmonary Perfusion  Result Date: 11/08/2019 CLINICAL DATA:  Shortness of breath and elevated D-dimer. EXAM: NUCLEAR MEDICINE PERFUSION LUNG SCAN TECHNIQUE: Perfusion images were obtained in multiple projections after intravenous injection of radiopharmaceutical. Ventilation scans intentionally deferred if perfusion scan and chest x-ray adequate for interpretation during COVID 19 epidemic. RADIOPHARMACEUTICALS:  4.2 mCi Tc-33m MAA IV COMPARISON:  Chest plain film, dated November 08, 2019, is available for comparison. FINDINGS: A small area of nonsegmental decreased perfusion is seen along the posterior aspect of the upper left lung. This is best seen on the posterior and left lateral views. No corresponding abnormalities are seen within this region on the recent chest plain film. Homogeneous distribution of tracer uptake is noted throughout the remainder of both lungs. IMPRESSION: 1. Small area of decreased perfusion seen along the posterior aspect of the left upper lobe. Sequelae associated with a small pulmonary embolism cannot be excluded. Further evaluation with chest CTA is recommended. Electronically Signed   By: Virgina Norfolk M.D.   On: 11/08/2019 19:55   ECHOCARDIOGRAM COMPLETE  Result Date: 11/09/2019    ECHOCARDIOGRAM REPORT   Patient Name:   ALAYASIA BREEDING Date of Exam: 11/09/2019 Medical Rec #:  [id number]         Height:       65.0 in Accession #:    [id number]        Weight:       185.0 lb Date of Birth:  [birth year]          BSA:          1.914 m Patient Age:    77 years          BP:           122/77 mmHg Patient Gender: F                 HR:           73 bpm. Exam Location:  Inpatient Procedure: 2D Echo, Color  Doppler and Cardiac Doppler Indications:    I26.02 Pulmonary embolus  History:        Patient has no prior history of Echocardiogram examinations.                 Risk Factors:Hypertension and Dyslipidemia.  Sonographer:    Raquel Sarna Senior RDCS Referring Phys: [place]  Sonographer Comments: Technically difficult study due to poor echo windows and hiatal hernia. IMPRESSIONS  1. Mild global reduction in LV systolic function; grade 1 diastolic dysfunction; right heart not well visualized.  2. Left ventricular ejection fraction, by estimation, is 45 to 50%. The left ventricle has mildly decreased function. The left ventricle demonstrates global hypokinesis. Left ventricular diastolic parameters are consistent with Grade I diastolic dysfunction (impaired relaxation). Elevated left atrial pressure.  3. Right ventricular systolic function is normal. The right ventricular size is normal. Tricuspid regurgitation signal is inadequate for assessing PA pressure.  4. Left atrial size was moderately dilated.  5. The  mitral valve is normal in structure. Mild mitral valve regurgitation. No evidence of mitral stenosis.  6. The aortic valve is tricuspid. Aortic valve regurgitation is mild. No aortic stenosis is present.  7. Aortic dilatation noted. There is mild dilatation of the aortic root measuring 39 mm.  8. The inferior vena cava is normal in size with greater than 50% respiratory variability, suggesting right atrial pressure of 3 mmHg. FINDINGS  Left Ventricle: Left ventricular ejection fraction, by estimation, is 45 to 50%. The left ventricle has mildly decreased function. The left ventricle demonstrates global hypokinesis. The left ventricular internal cavity size was normal in size. There is  no left ventricular hypertrophy. Left ventricular diastolic function could not be evaluated due to atrial fibrillation. Left ventricular diastolic parameters are consistent with Grade I diastolic dysfunction (impaired  relaxation). Elevated left atrial pressure. Right Ventricle: The right ventricular size is normal.Right ventricular systolic function is normal. Tricuspid regurgitation signal is inadequate for assessing PA pressure. The tricuspid regurgitant velocity is 1.95 m/s, and with an assumed right atrial pressure of 3 mmHg, the estimated right ventricular systolic pressure is 24.2 mmHg. Left Atrium: Left atrial size was moderately dilated. Right Atrium: Right atrial size was normal in size. Pericardium: There is no evidence of pericardial effusion. Mitral Valve: The mitral valve is normal in structure. Normal mobility of the mitral valve leaflets. Mild mitral annular calcification. Mild mitral valve regurgitation. No evidence of mitral valve stenosis. Tricuspid Valve: The tricuspid valve is normal in structure. Tricuspid valve regurgitation is trivial. No evidence of tricuspid stenosis. Aortic Valve: The aortic valve is tricuspid. Aortic valve regurgitation is mild. No aortic stenosis is present. Pulmonic Valve: The pulmonic valve was normal in structure. Pulmonic valve regurgitation is trivial. No evidence of pulmonic stenosis. Aorta: Aortic dilatation noted. There is mild dilatation of the aortic root measuring 39 mm. Venous: The inferior vena cava is normal in size with greater than 50% respiratory variability, suggesting right atrial pressure of 3 mmHg.  Additional Comments: Mild global reduction in LV systolic function; grade 1 diastolic dysfunction; right heart not well visualized.  LEFT VENTRICLE PLAX 2D LVIDd:         4.97 cm     Diastology LVIDs:         2.90 cm     LV e' lateral:   8.38 cm/s LV PW:         0.82 cm     LV E/e' lateral: 9.0 LV IVS:        1.00 cm     LV e' medial:    5.00 cm/s LVOT diam:     2.10 cm     LV E/e' medial:  15.1 LV SV:         62 LV SV Index:   32 LVOT Area:     3.46 cm  LV Volumes (MOD) LV vol d, MOD A4C: 95.0 ml LV vol s, MOD A4C: 44.2 ml LV SV MOD A4C:     95.0 ml RIGHT VENTRICLE RV  S prime:     12.00 cm/s TAPSE (M-mode): 2.1 cm LEFT ATRIUM             Index       RIGHT ATRIUM           Index LA diam:        4.30 cm 2.25 cm/m  RA Area:     16.30 cm LA Vol (A2C):   97.4 ml 50.90 ml/m RA Volume:  41.00 ml  21.42 ml/m LA Vol (A4C):   89.1 ml 46.56 ml/m LA Biplane Vol: 98.8 ml 51.63 ml/m  AORTIC VALVE LVOT Vmax:   76.10 cm/s LVOT Vmean:  57.300 cm/s LVOT VTI:    0.179 m  AORTA Ao Root diam: 3.40 cm Ao Asc diam:  3.85 cm MITRAL VALVE               TRICUSPID VALVE MV Area (PHT): 3.72 cm    TR Peak grad:   15.2 mmHg MV Decel Time: 204 msec    TR Vmax:        195.00 cm/s MV E velocity: 75.70 cm/s MV A velocity: 86.30 cm/s  SHUNTS MV E/A ratio:  0.88        Systemic VTI:  0.18 m                            Systemic Diam: 2.10 cm Kirk Ruths MD Electronically signed by Kirk Ruths MD Signature Date/Time: 11/09/2019/2:53:34 PM    Final    VAS [place] LOWER EXTREMITY VENOUS (DVT)  Result Date: 11/10/2019  Lower Venous DVTStudy Indications: Pulmonary embolism.  Limitations: Poor ultrasound/tissue interface. Comparison Study: No prior study Performing Technologist: Maudry Mayhew MHA, RDMS, RVT, RDCS  Examination Guidelines: A complete evaluation includes B-mode imaging, spectral Doppler, color Doppler, and power Doppler as needed of all accessible portions of each vessel. Bilateral testing is considered an integral part of a complete examination. Limited examinations for reoccurring indications may be performed as noted. The reflux portion of the exam is performed with the patient in reverse Trendelenburg.  +---------+---------------+---------+-----------+----------+--------------+ RIGHT    CompressibilityPhasicitySpontaneityPropertiesThrombus Aging +---------+---------------+---------+-----------+----------+--------------+ CFV      Full           Yes      Yes                                 +---------+---------------+---------+-----------+----------+--------------+ SFJ       Full                                                        +---------+---------------+---------+-----------+----------+--------------+ FV Prox  Full                                                        +---------+---------------+---------+-----------+----------+--------------+ FV Mid   Full                                                        +---------+---------------+---------+-----------+----------+--------------+ FV DistalFull                                                        +---------+---------------+---------+-----------+----------+--------------+ PFV      Full                                                        +---------+---------------+---------+-----------+----------+--------------+  POP      Full           Yes      Yes                                 +---------+---------------+---------+-----------+----------+--------------+ PTV      Full                                                        +---------+---------------+---------+-----------+----------+--------------+ PERO     Full                                                        +---------+---------------+---------+-----------+----------+--------------+   +---------+---------------+---------+-----------+----------+--------------+ LEFT     CompressibilityPhasicitySpontaneityPropertiesThrombus Aging +---------+---------------+---------+-----------+----------+--------------+ CFV      Full           Yes      Yes                                 +---------+---------------+---------+-----------+----------+--------------+ SFJ      Full                                                        +---------+---------------+---------+-----------+----------+--------------+ FV Prox  Full                                                        +---------+---------------+---------+-----------+----------+--------------+ FV Mid   Full                                                         +---------+---------------+---------+-----------+----------+--------------+ FV DistalFull                                                        +---------+---------------+---------+-----------+----------+--------------+ PFV      Full                                                        +---------+---------------+---------+-----------+----------+--------------+ POP      Full           Yes      Yes                                 +---------+---------------+---------+-----------+----------+--------------+  PTV      Full                                                        +---------+---------------+---------+-----------+----------+--------------+ PERO     Full                                                        +---------+---------------+---------+-----------+----------+--------------+     Summary: RIGHT: - There is no evidence of deep vein thrombosis in the lower extremity.  - No cystic structure found in the popliteal fossa.  LEFT: - There is no evidence of deep vein thrombosis in the lower extremity.  - No cystic structure found in the popliteal fossa.  *See table(s) above for measurements and observations. Electronically signed by Harold Barban MD on 11/10/2019 at 1:17:17 AM.    Final      Time coordinating discharge: Over 64 minutes    Dwyane Dee, MD  [facility] 11/12/2019, 2:11 PM Pager: Secure chat  If 7PM-7AM, please contact night-coverage [email or web address] Password [email or web address]

## 2019-11-12 NOTE — Progress Notes (Signed)
Ambulated pt to get HR/spO2 w/o nasal canula.  Pt reported she was very weak as she "had not ambulated all week."  Pt felt more comfortable ambulating within the room rather than in the hallway due to stiffness/weakness.  Start of Ambulation:  HR: 83 SpO2: 97 on Room Air  During Ambulation: HR: 83-100 SpO2: 93-86 on Room Air  End of Ambulation: HR: 79 SpO2: 100 on Room Air

## 2019-11-12 NOTE — TOC Transition Note (Signed)
Transition of Care [facility]) - CM/SW Discharge Note   Patient Details  Name: Krystal Henderson MRN: [id number] Date of Birth: 1932/04/20  Transition of Care [facility]) CM/SW Contact:  Shade Flood, LCSW Phone Number: 11/12/2019, 2:09 PM   Clinical Narrative:     Pt admitted from home. PT recommended SNF rehab. Met with pt to discuss. Pt states she wants to go home with [facility] and declines SNF rehab referral. Discussed [facility] provider options and pt agreeable to any agency that will take her insurance. Arranged for [facility] PT with [facility]. Also arranged Home O2 with [facility] and they will deliver to pt prior to dc.  Updated MD and RN. There are no other TOC needs for dc.   Expected Discharge Plan: [place] Barriers to Discharge: Barriers Resolved   Patient Goals and CMS Choice Patient states their goals for this hospitalization and ongoing recovery are:: go home CMS Medicare.gov Compare Post Acute Care list provided to:: Patient Choice offered to / list presented to : Patient  Expected Discharge Plan and Services Expected Discharge Plan: [place] In-house Referral: Clinical Social Work   Post Acute Care Choice: Home Health, Durable Medical Equipment Living arrangements for the past 2 months: [place] Expected Discharge Date: 11/12/19               DME Arranged: Oxygen DME Agency: [facility] Date DME Agency Contacted: 11/12/19   Representative spoke with at DME Agency: Thedore Mins HH Arranged: PT [place]: [place] Date [place]: 11/12/19   Representative spoke with at [place]: Georgina Snell  Prior Living Arrangements/Services Living arrangements for the past 2 months: [place] Lives with:: Spouse Patient language and need for interpreter reviewed:: Yes Do you feel safe going back to the place where you live?: Yes      Need for Family Participation in Patient Care: No (Comment) Care giver support system in place?: Yes  (comment) Current home services: DME Criminal Activity/Legal Involvement Pertinent to Current Situation/Hospitalization: No - Comment as needed  Activities of Daily Living Home Assistive Devices/Equipment: Eyeglasses, Environmental consultant (specify type) ADL Screening (condition at time of admission) Patient's cognitive ability adequate to safely complete daily activities?: Yes Is the patient deaf or have difficulty hearing?: No Does the patient have difficulty seeing, even when wearing glasses/contacts?: No Does the patient have difficulty concentrating, remembering, or making decisions?: No Patient able to express need for assistance with ADLs?: Yes Does the patient have difficulty dressing or bathing?: No Independently performs ADLs?: No Communication: Independent Dressing (OT): Independent Grooming: Independent Feeding: Independent Bathing: Independent Toileting: Needs assistance Is this a change from baseline?: Pre-admission baseline In/Out Bed: Needs assistance Is this a change from baseline?: Pre-admission baseline Walks in Home: Needs assistance Is this a change from baseline?: Pre-admission baseline Does the patient have difficulty walking or climbing stairs?: Yes Weakness of Legs: Both Weakness of Arms/Hands: Both  Permission Sought/Granted Permission sought to share information with : Chartered certified accountant granted to share information with : Yes, Verbal Permission Granted     Permission granted to share info w AGENCY: DME and HH agencies        Emotional Assessment Appearance:: Appears younger than stated age Attitude/Demeanor/Rapport: Engaged Affect (typically observed): Pleasant Orientation: : Oriented to Self, Oriented to Place, Oriented to  Time, Oriented to Situation Alcohol / Substance Use: Not Applicable Psych Involvement: No (comment)  Admission diagnosis:  Acute respiratory failure ([place]) [J96.00] Pulmonary embolism ([place]) [  I26.99] Single  subsegmental pulmonary embolism without acute cor pulmonale (HCC) [I26.93] Acute pulmonary embolism ([place]) [I26.99] Patient Active Problem List   Diagnosis Date Noted  . Acute on chronic combined systolic and diastolic CHF (congestive heart failure) ([place]) 11/12/2019  . AF (paroxysmal atrial fibrillation) ([place]) 11/10/2019  . Impaired mobility and ADLs 11/10/2019  . Acute respiratory failure with hypoxia ([place]) 11/08/2019  . Acute pulmonary embolism ([place]) 11/08/2019  . Impacted cerumen of left ear 01/03/2019  . Lesion of skin of scalp 12/30/2018  . Edema 06/22/2018  . Iron deficiency anemia 04/02/2018  . Lumbar compression fracture ([place]) 11/26/2015  . Lower extremity edema 10/23/2015  . Chronic low back pain 06/21/2014  . Pain in joint, shoulder region 02/11/2014  . Unspecified vitamin D deficiency 03/30/2013  . CKD (chronic kidney disease), stage IV ([place]) 03/28/2013  . Acute kidney injury ([place]) 01/01/2012  . Spinal stenosis 12/23/2011  . Osteopenia 12/23/2011  . Renal insufficiency, mild 08/24/2011  . Vitamin B12 deficiency 09/24/2010  . IBS (irritable bowel syndrome) 09/24/2010  . Peripheral neuropathy, idiopathic 09/24/2010  . GERD 08/12/2009  . DIVERTICULOSIS, COLON 09/08/2007  . Angiodysplasia of intestine with hemorrhage 09/08/2007  . ARTHRITIS 09/08/2007  . COLONIC POLYPS, ADENOMATOUS, HX OF 09/08/2007  . HYPERLIPIDEMIA 01/23/2007  . Essential hypertension 01/23/2007  . Osteoporosis 01/23/2007  . HYPOGLYCEMIA, REACTIVE 06/30/2006   PCP:  Ann Held, DO Pharmacy:   [facility] [phone] [place], [place] AT [place] [address] [place] [address] Phone: [phone] Fax: [phone]  [facility] [phone] [place], [place] - [place] AT [place] [address] [place] [address] Phone: [phone] Fax: [phone]     Social  Determinants of Health ([place]) Interventions    Readmission Risk Interventions Readmission Risk Prevention Plan 11/12/2019  Transportation Screening Complete  Home Care Screening Complete  Medication Review (RN CM) Complete  Some recent data might be hidden    Final next level of care: Home w Home Health Services Barriers to Discharge: Barriers Resolved   Patient Goals and CMS Choice Patient states their goals for this hospitalization and ongoing recovery are:: go home CMS Medicare.gov Compare Post Acute Care list provided to:: Patient Choice offered to / list presented to : Patient  Discharge Placement                       Discharge Plan and Services In-house Referral: Clinical Social Work   Post Acute Care Choice: Home Health, Durable Medical Equipment          DME Arranged: Oxygen DME Agency: [facility] Date DME Agency Contacted: 11/12/19   Representative spoke with at DME Agency: [place]: PT [place]: [place] Date [place]: 11/12/19   Representative spoke with at [place]: Georgina Snell  Social Determinants of Health ([place]) Interventions     Readmission Risk Interventions Readmission Risk Prevention Plan 11/12/2019  Transportation Screening Complete  Home Care Screening Complete  Medication Review (RN CM) Complete  Some recent data might be hidden

## 2019-11-12 NOTE — Assessment & Plan Note (Addendum)
-   elevated BNP on admission with new O2 requirement; patient desats with ambulation -Echo obtained on admission.  EF 45 to 45%, grade 1 diastolic dysfunction.  Mildly reduced EF per report -Continue O2 and will wean as able.  Will require home O2 at discharge

## 2019-11-12 NOTE — Progress Notes (Addendum)
SATURATION QUALIFICATIONS: (This note is used to comply with regulatory documentation for home oxygen)  Patient Saturations on Room Air at Rest = 100% Room Air  Patient Saturations on Room Air while Ambulating = 86%, patient desats while ambulating then saturation goes back to normal when not moving.   Patient Saturations on 2L Liters of oxygen while Ambulating = 100% via [place] O2  Please briefly explain why patient needs home oxygen: Patient desats while ambulating or with exertion.

## 2019-11-12 NOTE — Progress Notes (Signed)
Went over discharge information w/ patient and husband. Patient and husband verbalized understanding.

## 2019-11-12 NOTE — Care Management Important Message (Signed)
Important Message  Patient Details IM Letter given to the Patient Name: Krystal Henderson MRN: [id number] Date of Birth: 1932-12-21   Medicare Important Message Given:  Yes     [name] 11/12/2019, 11:48 AM

## 2019-11-13 DIAGNOSIS — I5043 Acute on chronic combined systolic (congestive) and diastolic (congestive) heart failure: Secondary | ICD-10-CM | POA: Diagnosis not present

## 2019-11-13 DIAGNOSIS — J9601 Acute respiratory failure with hypoxia: Secondary | ICD-10-CM | POA: Diagnosis not present

## 2019-11-13 DIAGNOSIS — I130 Hypertensive heart and chronic kidney disease with heart failure and stage 1 through stage 4 chronic kidney disease, or unspecified chronic kidney disease: Secondary | ICD-10-CM | POA: Diagnosis not present

## 2019-11-13 DIAGNOSIS — I2699 Other pulmonary embolism without acute cor pulmonale: Secondary | ICD-10-CM | POA: Diagnosis not present

## 2019-11-13 DIAGNOSIS — I0981 Rheumatic heart failure: Secondary | ICD-10-CM | POA: Diagnosis not present

## 2019-11-14 ENCOUNTER — Encounter: Payer: Self-pay | Admitting: Family Medicine

## 2019-11-14 ENCOUNTER — Telehealth: Payer: Self-pay | Admitting: Family Medicine

## 2019-11-14 NOTE — Telephone Encounter (Signed)
[name]: larry (Husband) Call back # [phone]  Patient was prescribed Eliquis recently and is wondering if she should continue taking 81 mg of aspirin.

## 2019-11-14 NOTE — Telephone Encounter (Signed)
Please advise 

## 2019-11-14 NOTE — Telephone Encounter (Signed)
Verbal given. FYI 

## 2019-11-14 NOTE — Telephone Encounter (Signed)
Noted -- agree Ann Held, DO

## 2019-11-14 NOTE — Telephone Encounter (Signed)
[name]: Sharyn Lull ([facility]) Call back # [phone]  Verbal order Continuing PT / working on gait, strengthen and balance  1 time a week for 1 week 2 times a week for 2 weeks 1 time a week for 4 weeks

## 2019-11-15 NOTE — Telephone Encounter (Signed)
There is also a phone note from 8/11 open about this same question.

## 2019-11-16 ENCOUNTER — Encounter: Payer: Self-pay | Admitting: Family Medicine

## 2019-11-16 ENCOUNTER — Telehealth: Payer: Self-pay | Admitting: Family Medicine

## 2019-11-16 NOTE — Telephone Encounter (Signed)
Please advise 

## 2019-11-16 NOTE — Telephone Encounter (Signed)
Telephone note sent on 11/14/2019.

## 2019-11-16 NOTE — Telephone Encounter (Signed)
CallerKamyrah Feeser  Call Back # [phone]   Per Pt's spouse ,Pt was hospitalized and d/c with oxygen. Patient states her O2 Sats are 95-98% and she would  like to send oxygen tanks back to [facility] [place] but, they will need an order sent to [place]  for them to pick up oxygen equipment.

## 2019-11-16 NOTE — Telephone Encounter (Signed)
She should have hosp f/u before making that decision--- does she have hosp f/u

## 2019-11-19 ENCOUNTER — Encounter: Payer: Self-pay | Admitting: Family Medicine

## 2019-11-19 NOTE — Telephone Encounter (Signed)
I answered this last week--- no to aspirin  And she needs hosp f/u

## 2019-11-20 DIAGNOSIS — J9601 Acute respiratory failure with hypoxia: Secondary | ICD-10-CM | POA: Diagnosis not present

## 2019-11-20 DIAGNOSIS — I0981 Rheumatic heart failure: Secondary | ICD-10-CM | POA: Diagnosis not present

## 2019-11-20 DIAGNOSIS — I5043 Acute on chronic combined systolic (congestive) and diastolic (congestive) heart failure: Secondary | ICD-10-CM | POA: Diagnosis not present

## 2019-11-20 DIAGNOSIS — I130 Hypertensive heart and chronic kidney disease with heart failure and stage 1 through stage 4 chronic kidney disease, or unspecified chronic kidney disease: Secondary | ICD-10-CM | POA: Diagnosis not present

## 2019-11-20 DIAGNOSIS — I2699 Other pulmonary embolism without acute cor pulmonale: Secondary | ICD-10-CM | POA: Diagnosis not present

## 2019-11-20 NOTE — Telephone Encounter (Signed)
See my chart message

## 2019-11-22 DIAGNOSIS — I130 Hypertensive heart and chronic kidney disease with heart failure and stage 1 through stage 4 chronic kidney disease, or unspecified chronic kidney disease: Secondary | ICD-10-CM | POA: Diagnosis not present

## 2019-11-22 DIAGNOSIS — J9601 Acute respiratory failure with hypoxia: Secondary | ICD-10-CM | POA: Diagnosis not present

## 2019-11-22 DIAGNOSIS — I5043 Acute on chronic combined systolic (congestive) and diastolic (congestive) heart failure: Secondary | ICD-10-CM | POA: Diagnosis not present

## 2019-11-22 DIAGNOSIS — I2699 Other pulmonary embolism without acute cor pulmonale: Secondary | ICD-10-CM | POA: Diagnosis not present

## 2019-11-22 DIAGNOSIS — I0981 Rheumatic heart failure: Secondary | ICD-10-CM | POA: Diagnosis not present

## 2019-11-26 DIAGNOSIS — I0981 Rheumatic heart failure: Secondary | ICD-10-CM | POA: Diagnosis not present

## 2019-11-26 DIAGNOSIS — J9601 Acute respiratory failure with hypoxia: Secondary | ICD-10-CM | POA: Diagnosis not present

## 2019-11-26 DIAGNOSIS — I5043 Acute on chronic combined systolic (congestive) and diastolic (congestive) heart failure: Secondary | ICD-10-CM | POA: Diagnosis not present

## 2019-11-26 DIAGNOSIS — I2699 Other pulmonary embolism without acute cor pulmonale: Secondary | ICD-10-CM | POA: Diagnosis not present

## 2019-11-26 DIAGNOSIS — I130 Hypertensive heart and chronic kidney disease with heart failure and stage 1 through stage 4 chronic kidney disease, or unspecified chronic kidney disease: Secondary | ICD-10-CM | POA: Diagnosis not present

## 2019-11-29 DIAGNOSIS — I130 Hypertensive heart and chronic kidney disease with heart failure and stage 1 through stage 4 chronic kidney disease, or unspecified chronic kidney disease: Secondary | ICD-10-CM | POA: Diagnosis not present

## 2019-11-29 DIAGNOSIS — I0981 Rheumatic heart failure: Secondary | ICD-10-CM | POA: Diagnosis not present

## 2019-11-29 DIAGNOSIS — I5043 Acute on chronic combined systolic (congestive) and diastolic (congestive) heart failure: Secondary | ICD-10-CM | POA: Diagnosis not present

## 2019-11-29 DIAGNOSIS — J9601 Acute respiratory failure with hypoxia: Secondary | ICD-10-CM | POA: Diagnosis not present

## 2019-11-29 DIAGNOSIS — I2699 Other pulmonary embolism without acute cor pulmonale: Secondary | ICD-10-CM | POA: Diagnosis not present

## 2019-12-03 ENCOUNTER — Telehealth: Payer: Medicare Other

## 2019-12-03 ENCOUNTER — Telehealth: Payer: Self-pay | Admitting: Pharmacist

## 2019-12-03 NOTE — Progress Notes (Addendum)
Chronic Care Management Pharmacy Assistant   Name: Krystal Henderson  MRN: [id number] DOB: 1933/01/30  Reason for Encounter: Initial Questions   PCP : Ann Held, DO  Allergies:   Allergies  Allergen Reactions  . Codeine Nausea Only  . Duloxetine Other (See Comments)    REACTION: intolerance--She does not remember taking this Rx- states she did not feel well   . Sertraline Hcl Other (See Comments)    REACTION: intolerance-- Did not help with Depression    Medications: Outpatient Encounter Medications as of 12/03/2019  Medication Sig  . ALPRAZolam (XANAX) 0.25 MG tablet take 1 tablet by mouth every 8 to 12 hours if needed (Patient taking differently: Take 0.25 mg by mouth 3 (three) times daily as needed for anxiety. )  . amLODipine (NORVASC) 5 MG tablet Take 1 tablet (5 mg total) by mouth daily.  [name] apixaban (ELIQUIS) 5 MG TABS tablet Take 2 tablets (10 mg total) by mouth 2 (two) times daily for 4 days.  [name] apixaban (ELIQUIS) 5 MG TABS tablet Take 1 tablet (5 mg total) by mouth 2 (two) times daily.  . Ascorbic Acid (VITAMIN C) 1000 MG tablet Take 1,000 mg by mouth daily.   [name] aspirin 81 MG tablet Take 81 mg by mouth every evening.   . Calcium Carbonate (CALCIUM 600 PO) Take 1 capsule by mouth daily.   . Cholecalciferol (VITAMIN D) 2000 UNITS CAPS Take 1 capsule by mouth daily.   . fluticasone (FLONASE) 50 MCG/ACT nasal spray Place 2 sprays into both nostrils daily. (Patient not taking: Reported on 11/08/2019)  . furosemide (LASIX) 20 MG tablet TAKE 1 TABLET(20 MG) BY MOUTH DAILY  . HYDROcodone-acetaminophen (NORCO/VICODIN) 5-325 MG tablet Take 1 tablet by mouth every 6 (six) hours as needed for moderate pain or severe pain.   [name] losartan (COZAAR) 100 MG tablet TAKE 1 TABLET BY MOUTH EVERY DAY  . LYRICA 100 MG capsule Take 1 capsule (100 mg total) by mouth at bedtime. (Patient taking differently: Take 100 mg by mouth at bedtime. Take along with 25 mg capsule=125 mg)  . LYRICA  25 MG capsule Take 1 capsule in the morning (Patient taking differently: Take 25 mg by mouth at bedtime. Take along with 100 mg capsule=125 mg)  . nortriptyline (PAMELOR) 10 MG capsule Take 1 capsule (10 mg total) by mouth at bedtime.  [name] Glycol-Propyl Glycol (SYSTANE ULTRA OP) Apply 1 drop to eye daily as needed (for dry eyes).  . polyethylene glycol (MIRALAX / GLYCOLAX) packet Take 17 g by mouth daily.  [name] VALTREX 1 g tablet Take 1,000 mg by mouth 2 (two) times daily.   No facility-administered encounter medications on file as of 12/03/2019.    Current Diagnosis: Patient Active Problem List   Diagnosis Date Noted  . Acute on chronic combined systolic and diastolic CHF (congestive heart failure) ([place]) 11/12/2019  . AF (paroxysmal atrial fibrillation) ([place]) 11/10/2019  . Impaired mobility and ADLs 11/10/2019  . Acute respiratory failure with hypoxia ([place]) 11/08/2019  . Acute pulmonary embolism ([place]) 11/08/2019  . Impacted cerumen of left ear 01/03/2019  . Lesion of skin of scalp 12/30/2018  . Edema 06/22/2018  . Iron deficiency anemia 04/02/2018  . Lumbar compression fracture ([place]) 11/26/2015  . Lower extremity edema 10/23/2015  . Chronic low back pain 06/21/2014  . Pain in joint, shoulder region 02/11/2014  . Unspecified vitamin D deficiency 03/30/2013  . CKD (chronic kidney disease), stage IV ([place]) 03/28/2013  .  Acute kidney injury ([place]) 01/01/2012  . Spinal stenosis 12/23/2011  . Osteopenia 12/23/2011  . Renal insufficiency, mild 08/24/2011  . Vitamin B12 deficiency 09/24/2010  . IBS (irritable bowel syndrome) 09/24/2010  . Peripheral neuropathy, idiopathic 09/24/2010  . GERD 08/12/2009  . DIVERTICULOSIS, COLON 09/08/2007  . Angiodysplasia of intestine with hemorrhage 09/08/2007  . ARTHRITIS 09/08/2007  . COLONIC POLYPS, ADENOMATOUS, HX OF 09/08/2007  . HYPERLIPIDEMIA 01/23/2007  . Essential hypertension 01/23/2007  . Osteoporosis 01/23/2007  . HYPOGLYCEMIA,  REACTIVE 06/30/2006    Goals Addressed   None     Have you seen any other providers since your last visit? Yes  Any changes in your medications or health? Yes  Any side effects from any medications? No  Do you have an symptoms or problems not managed by your medications? Patient states she has Neuropathy in feet and she takes Lyrica but does not feel that medication is helping her with pain.  Any concerns about your health right now? Yes, leg and feet pain  Has your provider asked that you check blood pressure, blood sugar, or follow special diet at home? No  Do you get any type of exercise on a regular basis? Patient states she walks around the house but does not get outside much.  Can you think of a goal you would like to reach for your health? Patient is unsure how to answer that question.  Do you have any problems getting your medications? No, her husband gets her medication  Is there anything that you would like to discuss during the appointment? Patient would like more information about the CCM program  Please bring medications and supplements to appointment   Follow-Up:  Pharmacist Review   Fanny Skates, [place] Pharmacist Assistant [phone]  Reviewed by: De Blanch, PharmD Clinical Pharmacist [facility] [phone]

## 2019-12-05 ENCOUNTER — Other Ambulatory Visit: Payer: Self-pay

## 2019-12-05 ENCOUNTER — Ambulatory Visit: Payer: Medicare Other | Admitting: Pharmacist

## 2019-12-05 DIAGNOSIS — I2699 Other pulmonary embolism without acute cor pulmonale: Secondary | ICD-10-CM | POA: Diagnosis not present

## 2019-12-05 DIAGNOSIS — I130 Hypertensive heart and chronic kidney disease with heart failure and stage 1 through stage 4 chronic kidney disease, or unspecified chronic kidney disease: Secondary | ICD-10-CM | POA: Diagnosis not present

## 2019-12-05 DIAGNOSIS — I0981 Rheumatic heart failure: Secondary | ICD-10-CM | POA: Diagnosis not present

## 2019-12-05 DIAGNOSIS — I10 Essential (primary) hypertension: Secondary | ICD-10-CM

## 2019-12-05 DIAGNOSIS — J9601 Acute respiratory failure with hypoxia: Secondary | ICD-10-CM | POA: Diagnosis not present

## 2019-12-05 DIAGNOSIS — E782 Mixed hyperlipidemia: Secondary | ICD-10-CM

## 2019-12-05 DIAGNOSIS — I5043 Acute on chronic combined systolic (congestive) and diastolic (congestive) heart failure: Secondary | ICD-10-CM | POA: Diagnosis not present

## 2019-12-05 DIAGNOSIS — G609 Hereditary and idiopathic neuropathy, unspecified: Secondary | ICD-10-CM

## 2019-12-05 NOTE — Chronic Care Management (AMB) (Signed)
 Chronic Care Management Pharmacy  Name: Krystal Henderson  MRN: [id number] DOB: 11/09/1932  Chief Complaint/ HPI  Krystal Henderson,  86 y.o. , female presents for their Initial CCM visit with the clinical pharmacist via telephone due to COVID-19 Pandemic.  PCP : Lowne Chase, Yvonne R, DO  Their chronic conditions include: Hypertension, Hyperlipidemia, Pre-DM, AFib, Heart Failure, Neuropathy, Anxiety, Back Pain, Osteoporosis  Office Visits: 11/19/19: Pt request to D/C oxygen. Pt instructed to schedule appt. Appt scheduled for 01/03/2020.  11/16/19: Pt question about taking aspirin and Eliquis. Advised pt to D/C aspirin.  Consult Visit: 08/09/19: Pain visit w/ Carol Knowles, PA  ED Visit:  11/08/19-11/12/19: [facility] - Hypoxia with patient converting to Afib. PE could not be excluded from VQ scan. Low dose metoprolol started on 11/10/19, but pt could not tolerate. Wean O2 as able. Continue Eliquis for minimum of 3 months.   Medications: Outpatient Encounter Medications as of 12/05/2019  Medication Sig  . amLODipine (NORVASC) 5 MG tablet Take 1 tablet (5 mg total) by mouth daily.  . apixaban (ELIQUIS) 5 MG TABS tablet Take 1 tablet (5 mg total) by mouth 2 (two) times daily.  . Ascorbic Acid (VITAMIN C) 1000 MG tablet Take 1,000 mg by mouth daily.   . Calcium Carbonate (CALCIUM 600 PO) Take 1 capsule by mouth daily.   . Cholecalciferol (VITAMIN D) 2000 UNITS CAPS Take 1 capsule by mouth daily.   . furosemide (LASIX) 20 MG tablet TAKE 1 TABLET(20 MG) BY MOUTH DAILY  . HYDROcodone-acetaminophen (NORCO/VICODIN) 5-325 MG tablet Take 1 tablet by mouth every 6 (six) hours as needed for moderate pain or severe pain.   . losartan (COZAAR) 100 MG tablet TAKE 1 TABLET BY MOUTH EVERY   . LYRICA 100 MG capsule Take 1 capsule (100 mg total) by mouth at bedtime. (Patient taking differently: Take 100 mg by mouth at bedtime. Take along with 25 mg capsule=125 mg)  . nortriptyline  (PAMELOR) 10 MG capsule Take 1 capsule (10 mg total) by mouth at bedtime.  . Polyethyl Glycol-Propyl Glycol (SYSTANE ULTRA OP) Apply 1 drop to eye daily as needed (for dry eyes).  . [DISCONTINUED] LYRICA 25 MG capsule Take 1 capsule in the morning (Patient taking differently: Take 25 mg by mouth at bedtime. Take along with 100 mg capsule=125 mg)  . ALPRAZolam (XANAX) 0.25 MG tablet take 1 tablet by mouth every 8 to 12 hours if needed (Patient not taking: Reported on 12/05/2019)  . apixaban (ELIQUIS) 5 MG TABS tablet Take 2 tablets (10 mg total) by mouth 2 (two) times daily for 4 days.  . aspirin 81 MG tablet Take 81 mg by mouth every evening.   . fluticasone (FLONASE) 50 MCG/ACT nasal spray Place 2 sprays into both nostrils daily. (Patient not taking: Reported on 11/08/2019)  . polyethylene glycol (MIRALAX / GLYCOLAX) packet Take 17 g by mouth daily. (Patient not taking: Reported on 12/05/2019)  . VALTREX 1 g tablet Take 1,000 mg by mouth 2 (two) times daily. (Patient not taking: Reported on 12/05/2019)   No facility-administered encounter medications on file as of 12/05/2019.   SDOH Screenings   Alcohol Screen:   . Last Alcohol Screening Score (AUDIT): Not on file  Depression (PHQ2-9):   . PHQ-2 Score: Not on file  Financial Resource Strain: Medium Risk  . Difficulty of Paying Living Expenses: Somewhat hard  Food Insecurity:   . Worried About Running Out of Food in the Last Year: Not on   file  . [place] in the Last Year: Not on file  Housing:   . Last Housing Risk Score: Not on file  Physical Activity:   . Days of Exercise per Week: Not on file  . Minutes of Exercise per Session: Not on file  Social Connections:   . Frequency of Communication with Friends and Family: Not on file  . Frequency of Social Gatherings with Friends and Family: Not on file  . Attends Religious Services: Not on file  . Active Member of Clubs or Organizations: Not on file  . Attends Archivist  Meetings: Not on file  . Marital Status: Not on file  Stress:   . Feeling of Stress : Not on file  Tobacco Use: Low Risk   . Smoking Tobacco Use: Never Smoker  . Smokeless Tobacco Use: Never Used  Transportation Needs:   . Film/video editor (Medical): Not on file  . Lack of Transportation (Non-Medical): Not on file     Current Diagnosis/Assessment:  Goals Addressed            This Visit's Progress   . Chronic Care Management Pharmacy Care Plan       CARE PLAN ENTRY (see longitudinal plan of care for additional care plan information)  Current Barriers:  . Chronic Disease Management support, education, and care coordination needs related to Hypertension, Hyperlipidemia, Pre-DM, AFib, Heart Failure, Neuropathy, Anxiety, Back Pain, Osteoporosis   Hypertension BP Readings from Last 3 Encounters:  11/12/19 (!) 88/52  07/02/19 (!) 160/80  06/20/19 115/72   . Pharmacist Clinical Goal(s): o Over the next 90days, patient will work with PharmD and providers to maintain BP goal <140/90 . Current regimen:  . Amlodipine 24m daily  . Losartan 1051mdaily . Interventions: o Discussed BP goals . Patient self care activities - Over the next 90 days, patient will: o Maintain hypertension medication regimen.   Hyperlipidemia Lab Results  Component Value Date/Time   LDLCALC 81 11/09/2019 01:50 AM   LDLCALC 126 (H) 12/29/2018 03:42 PM   LDLDIRECT 129.0 10/18/2014 11:56 AM   . Pharmacist Clinical Goal(s): o Over the next 90 days, patient will work with PharmD and providers to maintain LDL goal < 100 . Current regimen:  o Diet and exercise management   . Interventions: o Discussed LDL goal . Patient self care activities - Over the next 90 days, patient will: o Maintain diet and exercise regiment  Diabetes Lab Results  Component Value Date/Time   HGBA1C 5.7 (H) 11/09/2019 01:50 AM   HGBA1C 5.6 08/24/2011 10:18 AM   . Pharmacist Clinical Goal(s): o Over the next 90 days,  patient will work with PharmD and providers to maintain A1c goal <6.5% . Current regimen:  o Diet and exercise management   . Interventions: o Discussed a1c goal . Patient self care activities - Over the next 90 days, patient will: o Maintain a1c <6.5%  Heart Failure . Pharmacist Clinical Goal(s) o Over the next 90 days, patient will work with PharmD and providers to reduce symptoms associated with heart failure . Current regimen:  o Furosemide 2031maily . Interventions: o Encouraged patient to weigh daily o Educated patient to notify office if she gains more than 3lbs in one Kalley Nicholl or 5lbs in one week . Patient self care activities - Over the next 90 days, patient will: o Weigh daily  o Notify office if she gains more than 3lbs in one Marni Franzoni or 5lbs in one week  Neuropathy . Pharmacist Clinical Goal(s) o Over the next 90 days, patient will work with PharmD and providers to reduce symptoms associated with neuropathy . Current regimen:  . Lyrica 100mg daily at bedtime . Lyrica 25mg daily at bedtime . Nortriptyline 10mg daily at  . Interventions: o Discussed that patient was deficient in Vitamin B12 and this could contribute to her neuropathy o Encouraged patient to try Vitamin B12 1000mcg daily over the counter o Collaboration with provider regarding medication management (consider rechecking Vitamin B12) . Patient self care activities - Over the next 90 days, patient will: o Have B12 rechecked at next office visit o Consider taking Vitamin B12 1000mcg   Osteoporosis . Pharmacist Clinical Goal(s) o Over the next 90 days, patient will work with PharmD and providers to reduce risk of fracture associated with osteoporosis . Current regimen:  . Calcium Carbonate 600mg #2 daily  . Vitamin D 2000 units once daily . Interventions: o Consider taking calcium twice daily vs #2 tabs daily to increase absorption o Consider repeat DEXA Scan . Patient self care activities - Over the next 90  days, patient will: o Consider taking calcium twice daily vs #2 tabs daily to increase absorption o Consider repeat DEXA Scan  Health Maintenance  . Pharmacist Clinical Goal(s) o Over the next 180 days, patient will work with PharmD and providers to complete health maintenance screenings/vaccinations . Interventions: o Discussed Shingrix Vaccine Series . Patient self care activities - Over the next 180 days, patient will: o Complete Shingrix vaccine series  Medication management . Pharmacist Clinical Goal(s): o Over the next 90 days, patient will work with PharmD and providers to maintain optimal medication adherence . Current pharmacy: [facility] . Interventions o Comprehensive medication review performed. o Continue current medication management strategy . Patient self care activities - Over the next 90 days, patient will: o Focus on medication adherence by filling and taking medications appropriately  o Take medications as prescribed o Report any questions or concerns to PharmD and/or provider(s)  Initial goal documentation      Social Hx:  Born and raised in [place]. Married 57 years.  Had one daughter who passed away. Also had a grandson and he passed away. Does have a great grandson. No pets.  Hypertension   BP goal is:  <140/90  Office blood pressures are  BP Readings from Last 3 Encounters:  11/12/19 (!) 88/52  07/02/19 (!) 160/80  06/20/19 115/72   Patient checks BP at home infrequently Patient home BP readings are ranging: 130/60 this morning. States it has been normal when PT comes in and checks it.  Patient has failed these meds in the past: quinapril (listed in D/C meds. No apparent reason for D/C) Patient is currently controlled on the following medications:  . Amlodipine 5mg daily  . Losartan 100mg daily  Fill Dates Per Dispense Report - Filled Appropriately Amlodipine 10/28/19 - 90 DS 07/28/19 - 90 DS  Losartan 11/01/19 - 90 DS 07/28/19 - 90 DS  We  discussed BP goals  Plan -Continue current medications     Hyperlipidemia   LDL goal < 100  Lipid Panel     Component Value Date/Time   CHOL 146 11/09/2019 0150   TRIG 48 11/09/2019 0150   HDL 55 11/09/2019 0150   LDLCALC 81 11/09/2019 0150   LDLCALC 126 (H) 12/29/2018 1542   LDLDIRECT 129.0 10/18/2014 1156    Hepatic Function Latest Ref Rng & Units 07/02/2019 12/29/2018 06/22/2018  Total Protein 6.0 -   8.3 g/dL 6.5 6.6 6.8  Albumin 3.5 - 5.2 g/dL 3.9 - 4.1  AST 0 - 37 U/L 11 11 11  ALT 0 - 35 U/L 11 10 11  Alk Phosphatase 39 - 117 U/L 53 - 53  Total Bilirubin 0.2 - 1.2 mg/dL 0.4 0.5 0.5  Bilirubin, Direct 0.0 - 0.3 mg/dL - - -     The ASCVD Risk score (Goff DC Jr., et al., 2013) failed to calculate for the following reasons:   The 2013 ASCVD risk score is only valid for ages 40 to 79   Patient has failed these meds in past: None noted  Patient is currently controlled on the following medications:  . None  We discussed:  LDL goal  Plan -Continue control with diet and exercise  Pre-Diabetes   A1c goal <6.5%  Recent Relevant Labs: Lab Results  Component Value Date/Time   HGBA1C 5.7 (H) 11/09/2019 01:50 AM   HGBA1C 5.6 08/24/2011 10:18 AM   GFR 23.27 (L) 07/02/2019 03:38 PM   GFR 25.18 (L) 06/22/2018 03:21 PM     Patient has failed these meds in past: None noted  Patient is currently controlled on the following medications: . None  We discussed: A1c goal less than 6.5%   Plan -Continue control with diet and exercise   AFIB   CMP Latest Ref Rng & Units 11/12/2019 11/11/2019 11/10/2019  Glucose 70 - 99 mg/dL 96 95 88  BUN 8 - 23 mg/dL 38(H) 37(H) 31(H)  Creatinine 0.44 - 1.00 mg/dL 1.79(H) 1.87(H) 1.64(H)  Sodium 135 - 145 mmol/L 131(L) 132(L) 136  Potassium 3.5 - 5.1 mmol/L 5.0 4.9 4.8  Chloride 98 - 111 mmol/L 99 101 104  CO2 22 - 32 mmol/L 22 22 23  Calcium 8.9 - 10.3 mg/dL 8.6(L) 8.7(L) 9.1  Total Protein 6.0 - 8.3 g/dL - - -  Total Bilirubin 0.2 - 1.2  mg/dL - - -  Alkaline Phos 39 - 117 U/L - - -  AST 0 - 37 U/L - - -  ALT 0 - 35 U/L - - -   Patient is currently neither controlled.  Patient has failed these meds in past: None noted  Patient is currently controlled on the following medications:  Eliquis 5mg twice daily  Cost $133/month  For indication of PE treatment patient is on appropriate dose (No dose adjustment recommended; 5mg BID).  For indication of Afib, recommended dose would be 2.5mg BID noting patient age >80, and Scr >1.5   We discussed:  Dosing of Eliquis based on indications  Plan -Continue current medications   Future Plan -Consider completing patient assistance application for Eliquis  Heart Failure   Type: Combined Systolic and Diastolic  Last ejection fraction: 11/09/19 45-50% NYHA Class: I (no actitivty limitation) AHA HF Stage: B (Heart disease present - no symptoms present)  Patient has failed these meds in past: None noted  Patient is currently controlled on the following medications:  Furosemide 20mg daily  Reports she has bilateral edema at baseline. Legs are worse when she has her legs hanging down.  Denies SOB. Does not weigh daily. Acknowledges she was instructed to weigh daily. Reinforced daily weights  We discussed weighing daily; if you gain more than 3 pounds in one  or 5 pounds in one week call your doctor  Plan -Continue current medications  -Weigh self daily  Neuropathy   Vitamin B12 - 11/09/19 - 59   Patient has failed these meds in past: pregabalin (ineffective),   gabapentin (ineffective), duloxetine (listed in allergies as did not feel well) Patient is currently uncontrolled on the following medications: . Lyrica 162m daily at bedtime . Lyrica 282mdaily at bedtime . Nortriptyline 1087maily at bedtime  Is getting home PT. She feels it is helping. States she takes 33m88mrica in the morning and 100mg73mbedtime Has taken lyrica for years. Nortriptyline is more  recent. States Lyrica is expensive $68 33mg 41m 100mg  3miscussed:  how she is deficient and B12 and this could be a contributing factor to her neuropathy. Recommended patient to start B12 1000mcg d49m and get B12 rechecked at visit w/ Dr. Lowne onEtter Sjogren/21  Plan -Consider starting B12 1000mcg da46m-Get B12 rechecked at visit with Dr. Lowne on Etter Sjogren21 -Continue current medications   Anxiety   Assess further at future visits  Patient has failed these meds in past: None noted  Patient is currently controlled on the following medications: . Alprazolam 0.33mg ever57mto 12 hours as needed   Plan -Continue current medications   Back Pain    Patient has failed these meds in past: Codeine (nausea) Patient is currently controlled on the following medications: . Hydrocodone 5-333mg every30mours as needed for pain  Doesn't like taking hydrocodone due to fear of dependency. Takes about 3 times per week. Usually uses tylenol.   Plan -Continue current medications  Osteoporosis   Last DEXA Scan: 06/08/2016  T-Score femoral neck: -1.8 (L)  T-Score forearm radius: -3.0  Vit D, 25-Hydroxy  Date Value Ref Range Status  12/23/2011 58 30 - 89 ng/mL Final    Comment:    This assay accurately quantifies Vitamin D, which is the sum of the 25-Hydroxy forms of Vitamin D2 and D3.  Studies have shown that the optimum concentration of 25-Hydroxy Vitamin D is 30 ng/mL or higher.  Concentrations of Vitamin D between 20 and 29 ng/mL are considered to be insufficient and concentrations less than 20 ng/mL are considered to be deficient for Vitamin D.     Patient has failed these meds in past: None noted  Patient is currently controlled on the following medications:  . Calcium Carbonate 600mg #2 dai3m. Vitamin D 2000 units once daily  States she took fosamax for 5 years and was instructed to stop. States she stopped several years ago.  Discussed it is better to take calcium 600mg  twice 64my vs #2 daily as the body can only absorb 500-600mg of calci33mt a time.  Plan -Consider repeat DEXA Scan -Continue current medications  Vaccines   Reviewed and discussed patient's vaccination history.    Immunization History  Administered Date(s) Administered  . Fluad Quad(high Dose 65+) 12/29/2018  . Influenza Split 01/01/2012  . Influenza Whole 01/03/2008, 12/30/2009  . Influenza, High Dose Seasonal PF 01/04/2013, 01/08/2014, 12/22/2017  . Influenza-Unspecified 12/19/2014, 12/25/2015, 01/11/2017  . PFIZER SARS-COV-2 Vaccination 05/14/2019, 06/08/2019  . Pneumococcal Conjugate-13 02/11/2014  . Pneumococcal Polysaccharide-23 12/30/2009, 01/01/2012  . Tdap 03/15/2011  . Zoster 04/30/2013   Plan -Recommended patient receive Shingrix vaccine in pharmacy.   Medication Management   [facility] Uses [facility]? Yes has an AM and PM box Pt endorses 100% compliance  We discussed: Discussed benefits of medication synchronization, packaging and delivery as well as enhanced pharmacist oversight with [employer]. Patient would like to think about it.  Miscellaneous Meds Vitamin C 1000mg Systane e15mrop  Meds to D/C from list Miralax (uses prunes  and dates cooked together; she takes 2 tablespoons per ) Fluticasone 50mcg/nasal spray (not effective) Valtrex 1000mg Aspirin 81mg (should be D/C per message from 11/16/19)   Plan  Continue current medication management strategy   Follow up:  1 month phone visit   , PharmD Clinical Pharmacist [facility] [phone]          

## 2019-12-06 ENCOUNTER — Other Ambulatory Visit: Payer: Self-pay

## 2019-12-06 DIAGNOSIS — E782 Mixed hyperlipidemia: Secondary | ICD-10-CM

## 2019-12-06 DIAGNOSIS — I10 Essential (primary) hypertension: Secondary | ICD-10-CM

## 2019-12-07 ENCOUNTER — Other Ambulatory Visit: Payer: Self-pay | Admitting: Neurology

## 2019-12-07 NOTE — Telephone Encounter (Signed)
Pt is requesting a refill for LYRICA 25 MG capsule.  Pharmacy: [facility] [phone]

## 2019-12-11 ENCOUNTER — Telehealth: Payer: Self-pay | Admitting: Neurology

## 2019-12-11 MED ORDER — LYRICA 25 MG PO CAPS: 25.0000 mg | ORAL_CAPSULE | Freq: Every day | ORAL | 5 refills | Status: DC

## 2019-12-11 NOTE — Addendum Note (Signed)
Addended by: Roberts Gaudy L on: 12/11/2019 11:45 AM   Modules accepted: Orders

## 2019-12-11 NOTE — Telephone Encounter (Signed)
Patient is calling for her 25 mg Lyrica  refill please call to her pharmacy .

## 2019-12-11 NOTE — Telephone Encounter (Signed)
I have already done this

## 2019-12-12 DIAGNOSIS — I0981 Rheumatic heart failure: Secondary | ICD-10-CM | POA: Diagnosis not present

## 2019-12-12 DIAGNOSIS — I130 Hypertensive heart and chronic kidney disease with heart failure and stage 1 through stage 4 chronic kidney disease, or unspecified chronic kidney disease: Secondary | ICD-10-CM | POA: Diagnosis not present

## 2019-12-12 DIAGNOSIS — J9601 Acute respiratory failure with hypoxia: Secondary | ICD-10-CM | POA: Diagnosis not present

## 2019-12-12 DIAGNOSIS — I2699 Other pulmonary embolism without acute cor pulmonale: Secondary | ICD-10-CM | POA: Diagnosis not present

## 2019-12-12 DIAGNOSIS — I5043 Acute on chronic combined systolic (congestive) and diastolic (congestive) heart failure: Secondary | ICD-10-CM | POA: Diagnosis not present

## 2019-12-19 DIAGNOSIS — I5043 Acute on chronic combined systolic (congestive) and diastolic (congestive) heart failure: Secondary | ICD-10-CM | POA: Diagnosis not present

## 2019-12-19 DIAGNOSIS — I130 Hypertensive heart and chronic kidney disease with heart failure and stage 1 through stage 4 chronic kidney disease, or unspecified chronic kidney disease: Secondary | ICD-10-CM | POA: Diagnosis not present

## 2019-12-19 DIAGNOSIS — J9601 Acute respiratory failure with hypoxia: Secondary | ICD-10-CM | POA: Diagnosis not present

## 2019-12-19 DIAGNOSIS — I2699 Other pulmonary embolism without acute cor pulmonale: Secondary | ICD-10-CM | POA: Diagnosis not present

## 2019-12-19 DIAGNOSIS — I0981 Rheumatic heart failure: Secondary | ICD-10-CM | POA: Diagnosis not present

## 2019-12-25 DIAGNOSIS — I2699 Other pulmonary embolism without acute cor pulmonale: Secondary | ICD-10-CM | POA: Diagnosis not present

## 2019-12-25 DIAGNOSIS — J9601 Acute respiratory failure with hypoxia: Secondary | ICD-10-CM | POA: Diagnosis not present

## 2019-12-25 DIAGNOSIS — I0981 Rheumatic heart failure: Secondary | ICD-10-CM | POA: Diagnosis not present

## 2019-12-25 DIAGNOSIS — I130 Hypertensive heart and chronic kidney disease with heart failure and stage 1 through stage 4 chronic kidney disease, or unspecified chronic kidney disease: Secondary | ICD-10-CM | POA: Diagnosis not present

## 2019-12-25 DIAGNOSIS — I5043 Acute on chronic combined systolic (congestive) and diastolic (congestive) heart failure: Secondary | ICD-10-CM | POA: Diagnosis not present

## 2020-01-03 ENCOUNTER — Other Ambulatory Visit: Payer: Self-pay

## 2020-01-03 ENCOUNTER — Ambulatory Visit (INDEPENDENT_AMBULATORY_CARE_PROVIDER_SITE_OTHER): Payer: Medicare Other | Admitting: Family Medicine

## 2020-01-03 ENCOUNTER — Encounter: Payer: Self-pay | Admitting: Family Medicine

## 2020-01-03 VITALS — BP 120/86 | HR 74 | Temp 97.7°F | Resp 18 | Ht 65.0 in | Wt 171.4 lb

## 2020-01-03 DIAGNOSIS — Z23 Encounter for immunization: Secondary | ICD-10-CM

## 2020-01-03 DIAGNOSIS — E559 Vitamin D deficiency, unspecified: Secondary | ICD-10-CM | POA: Diagnosis not present

## 2020-01-03 DIAGNOSIS — I10 Essential (primary) hypertension: Secondary | ICD-10-CM

## 2020-01-03 DIAGNOSIS — I2699 Other pulmonary embolism without acute cor pulmonale: Secondary | ICD-10-CM

## 2020-01-03 DIAGNOSIS — E782 Mixed hyperlipidemia: Secondary | ICD-10-CM | POA: Diagnosis not present

## 2020-01-03 DIAGNOSIS — I4891 Unspecified atrial fibrillation: Secondary | ICD-10-CM | POA: Diagnosis not present

## 2020-01-03 DIAGNOSIS — E538 Deficiency of other specified B group vitamins: Secondary | ICD-10-CM

## 2020-01-03 DIAGNOSIS — I480 Paroxysmal atrial fibrillation: Secondary | ICD-10-CM

## 2020-01-03 NOTE — Patient Instructions (Signed)
Visit Information  Goals Addressed            This Visit's Progress   . Chronic Care Management Pharmacy Care Plan       CARE PLAN ENTRY (see longitudinal plan of care for additional care plan information)  Current Barriers:  . Chronic Disease Management support, education, and care coordination needs related to Hypertension, Hyperlipidemia, Pre-DM, AFib, Heart Failure, Neuropathy, Anxiety, Back Pain, Osteoporosis   Hypertension BP Readings from Last 3 Encounters:  11/12/19 (!) 88/52  07/02/19 (!) 160/80  06/20/19 115/72   . Pharmacist Clinical Goal(s): o Over the next 90days, patient will work with PharmD and providers to maintain BP goal <140/90 . Current regimen:  . Amlodipine 5mg  daily  . Losartan 100mg  daily . Interventions: o Discussed BP goals . Patient self care activities - Over the next 90 days, patient will: o Maintain hypertension medication regimen.   Hyperlipidemia Lab Results  Component Value Date/Time   LDLCALC 81 11/09/2019 01:50 AM   LDLCALC 126 (H) 12/29/2018 03:42 PM   LDLDIRECT 129.0 10/18/2014 11:56 AM   . Pharmacist Clinical Goal(s): o Over the next 90 days, patient will work with PharmD and providers to maintain LDL goal < 100 . Current regimen:  o Diet and exercise management   . Interventions: o Discussed LDL goal . Patient self care activities - Over the next 90 days, patient will: o Maintain diet and exercise regiment  Diabetes Lab Results  Component Value Date/Time   HGBA1C 5.7 (H) 11/09/2019 01:50 AM   HGBA1C 5.6 08/24/2011 10:18 AM   . Pharmacist Clinical Goal(s): o Over the next 90 days, patient will work with PharmD and providers to maintain A1c goal <6.5% . Current regimen:  o Diet and exercise management   . Interventions: o Discussed a1c goal . Patient self care activities - Over the next 90 days, patient will: o Maintain a1c <6.5%  Heart Failure . Pharmacist Clinical Goal(s) o Over the next 90 days, patient will work  with PharmD and providers to reduce symptoms associated with heart failure . Current regimen:  o Furosemide 20mg  daily . Interventions: o Encouraged patient to weigh daily o Educated patient to notify office if she gains more than 3lbs in one Shellie Rogoff or 5lbs in one week . Patient self care activities - Over the next 90 days, patient will: o Weigh daily  o Notify office if she gains more than 3lbs in one Kolby Myung or 5lbs in one week  Neuropathy . Pharmacist Clinical Goal(s) o Over the next 90 days, patient will work with PharmD and providers to reduce symptoms associated with neuropathy . Current regimen:  . Lyrica 100mg  daily at bedtime . Lyrica 25mg  daily at bedtime . Nortriptyline 10mg  daily at  . Interventions: o Discussed that patient was deficient in Vitamin B12 and this could contribute to her neuropathy o Encouraged patient to try Vitamin B12 1013mcg daily over the counter o Collaboration with provider regarding medication management (consider rechecking Vitamin B12) . Patient self care activities - Over the next 90 days, patient will: o Have B12 rechecked at next office visit o Consider taking Vitamin B12 1033mcg   Osteoporosis . Pharmacist Clinical Goal(s) o Over the next 90 days, patient will work with PharmD and providers to reduce risk of fracture associated with osteoporosis . Current regimen:  . Calcium Carbonate 600mg  #2 daily  . Vitamin D 2000 units once daily . Interventions: o Consider taking calcium twice daily vs #2 tabs daily to increase  absorption o Consider repeat DEXA Scan . Patient self care activities - Over the next 90 days, patient will: o Consider taking calcium twice daily vs #2 tabs daily to increase absorption o Consider repeat DEXA Scan  Health Maintenance  . Pharmacist Clinical Goal(s) o Over the next 180 days, patient will work with PharmD and providers to complete health maintenance screenings/vaccinations . Interventions: o Discussed Shingrix  Vaccine Series . Patient self care activities - Over the next 180 days, patient will: o Complete Shingrix vaccine series  Medication management . Pharmacist Clinical Goal(s): o Over the next 90 days, patient will work with PharmD and providers to maintain optimal medication adherence . Current pharmacy: [facility] . Interventions o Comprehensive medication review performed. o Continue current medication management strategy . Patient self care activities - Over the next 90 days, patient will: o Focus on medication adherence by filling and taking medications appropriately  o Take medications as prescribed o Report any questions or concerns to PharmD and/or provider(s)  Initial goal documentation        Ms. Montagna was given information about Chronic Care Management services today including:  1. CCM service includes personalized support from designated clinical staff supervised by her physician, including individualized plan of care and coordination with other care providers 2. 24/7 contact phone numbers for assistance for urgent and routine care needs. 3. Standard insurance, coinsurance, copays and deductibles apply for chronic care management only during months in which we provide at least 20 minutes of these services. Most insurances cover these services at 100%, however patients may be responsible for any copay, coinsurance and/or deductible if applicable. This service may help you avoid the need for more expensive face-to-face services. 4. Only one practitioner may furnish and bill the service in a calendar month. 5. The patient may stop CCM services at any time (effective at the end of the month) by phone call to the office staff.  Patient agreed to services and verbal consent obtained.   The patient verbalized understanding of instructions provided today and agreed to receive a mailed copy of patient instruction and/or educational materials. Telephone follow up appointment with  pharmacy team member scheduled for: 01/10/2020  Melvenia Beam Thara Searing, PharmD Clinical Pharmacist [facility] [phone]   How to Take Your Blood Pressure You can take your blood pressure at home with a machine. You may need to check your blood pressure at home:  To check if you have high blood pressure (hypertension).  To check your blood pressure over time.  To make sure your blood pressure medicine is working. Supplies needed: You will need a blood pressure machine, or monitor. You can buy one at a drugstore or online. When choosing one:  Choose one with an arm cuff.  Choose one that wraps around your upper arm. Only one finger should fit between your arm and the cuff.  Do not choose one that measures your blood pressure from your wrist or finger. Your doctor can suggest a monitor. How to prepare Avoid these things for 30 minutes before checking your blood pressure:  Drinking caffeine.  Drinking alcohol.  Eating.  Smoking.  Exercising. Five minutes before checking your blood pressure:  Pee.  Sit in a dining chair. Avoid sitting in a soft couch or armchair.  Be quiet. Do not talk. How to take your blood pressure Follow the instructions that came with your machine. If you have a digital blood pressure monitor, these may be the instructions: 1. Sit  up straight. 2. Place your feet on the floor. Do not cross your ankles or legs. 3. Rest your left arm at the level of your heart. You may rest it on a table, desk, or chair. 4. Pull up your shirt sleeve. 5. Wrap the blood pressure cuff around the upper part of your left arm. The cuff should be 1 inch (2.5 cm) above your elbow. It is best to wrap the cuff around bare skin. 6. Fit the cuff snugly around your arm. You should be able to place only one finger between the cuff and your arm. 7. Put the cord inside the groove of your elbow. 8. Press the power button. 9. Sit quietly while the cuff fills  with air and loses air. 10. Write down the numbers on the screen. 11. Wait 2-3 minutes and then repeat steps 1-10. What do the numbers mean? Two numbers make up your blood pressure. The first number is called systolic pressure. The second is called diastolic pressure. An example of a blood pressure reading is "120 over 80" (or 120/80). If you are an adult and do not have a medical condition, use this guide to find out if your blood pressure is normal: Normal  First number: below 120.  Second number: below 80. Elevated  First number: 120-129.  Second number: below 80. Hypertension stage 1  First number: 130-139.  Second number: 80-89. Hypertension stage 2  First number: 140 or above.  Second number: 22 or above. Your blood pressure is above normal even if only the top or bottom number is above normal. Follow these instructions at home:  Check your blood pressure as often as your doctor tells you to.  Take your monitor to your next doctor's appointment. Your doctor will: ? Make sure you are using it correctly. ? Make sure it is working right.  Make sure you understand what your blood pressure numbers should be.  Tell your doctor if your medicines are causing side effects. Contact a doctor if:  Your blood pressure keeps being high. Get help right away if:  Your first blood pressure number is higher than 180.  Your second blood pressure number is higher than 120. This information is not intended to replace advice given to you by your health care provider. Make sure you discuss any questions you have with your health care provider. Document Revised: 03/04/2017 Document Reviewed: 08/29/2015 Elsevier Patient Education  2020 [employer].

## 2020-01-03 NOTE — Assessment & Plan Note (Signed)
On eliquis  f/u cardiology---- recommended at [place] d/c put app not made

## 2020-01-03 NOTE — Assessment & Plan Note (Signed)
Well controlled, no changes to meds. Encouraged heart healthy diet such as the DASH diet and exercise as tolerated.  norvasc and losartan

## 2020-01-03 NOTE — Assessment & Plan Note (Signed)
Encouraged heart healthy diet, increase exercise, avoid trans fats, consider a krill oil cap daily 

## 2020-01-03 NOTE — Patient Instructions (Signed)
Vitamin B12 Deficiency Vitamin B12 deficiency means that your body does not have enough vitamin B12. The body needs this vitamin:  To make red blood cells.  To make genes (DNA).  To help the nerves work. If you do not have enough vitamin B12 in your body, you can have health problems. What are the causes?  Not eating enough foods that contain vitamin B12.  Not being able to absorb vitamin B12 from the food that you eat.  Certain digestive system diseases.  A condition in which the body does not make enough of a certain protein, which results in too few red blood cells (pernicious anemia).  Having a surgery in which part of the stomach or small intestine is removed.  Taking medicines that make it hard for the body to absorb vitamin B12. These medicines include: ? Heartburn medicines. ? Some antibiotic medicines. ? Other medicines that are used to treat certain conditions. What increases the risk?  Being older than age 50.  Eating a vegetarian or vegan diet, especially while you are pregnant.  Eating a poor diet while you are pregnant.  Taking certain medicines.  Having alcoholism. What are the signs or symptoms? In some cases, there are no symptoms. If the condition leads to too few blood cells or nerve damage, symptoms can occur, such as:  Feeling weak.  Feeling tired (fatigued).  Not being hungry.  Weight loss.  A loss of feeling (numbness) or tingling in your hands and feet.  Redness and burning of the tongue.  Being mixed up (confused) or having memory problems.  Sadness (depression).  Problems with your senses. This can include color blindness, ringing in the ears, or loss of taste.  Watery poop (diarrhea) or trouble pooping (constipation).  Trouble walking. If anemia is very bad, symptoms can include:  Being short of breath.  Being dizzy.  Having a very fast heartbeat. How is this treated?  Changing the way you eat and drink, such  as: ? Eating more foods that contain vitamin B12. ? Drinking little or no alcohol.  Getting vitamin B12 shots.  Taking vitamin B12 supplements. Your doctor will tell you the dose that is best for you. Follow these instructions at home: Eating and drinking   Eat lots of healthy foods that contain vitamin B12. These include: ? Meats and poultry, such as beef, pork, chicken, turkey, and organ meats, such as liver. ? Seafood, such as clams, rainbow trout, salmon, tuna, and haddock. ? Eggs. ? Cereal and dairy products that have vitamin B12 added to them. Check the label. The items listed above may not be a complete list of what you can eat and drink. Contact a dietitian for more options. General instructions  Get any shots as told by your doctor.  Take supplements only as told by your doctor.  Do not drink alcohol if your doctor tells you not to. In some cases, you may only be asked to limit alcohol use.  Keep all follow-up visits as told by your doctor. This is important. Contact a doctor if:  Your symptoms come back. Get help right away if:  You have trouble breathing.  You have a very fast heartbeat.  You have chest pain.  You get dizzy.  You pass out. Summary  Vitamin B12 deficiency means that your body is not getting enough vitamin B12.  In some cases, there are no symptoms of this condition.  Treatment may include making a change in the way you eat and drink,   getting vitamin B12 shots, or taking supplements.  Eat lots of healthy foods that contain vitamin B12. This information is not intended to replace advice given to you by your health care provider. Make sure you discuss any questions you have with your health care provider. Document Revised: 11/29/2017 Document Reviewed: 11/29/2017 Elsevier Patient Education  2020 [employer]  

## 2020-01-03 NOTE — Assessment & Plan Note (Signed)
On eliquis F/u cardiology due to a fib in hosp

## 2020-01-03 NOTE — Progress Notes (Signed)
Patient ID: Krystal Henderson, female    DOB: May 29, 1932  Age: 86 y.o. MRN: [id number]    Subjective:  Subjective  HPI Krystal Henderson presents for f/u bp , chol and b12 def.    She is on the eliquis for PE/ a fib.   She never did see cardiology ---an app was never made  Pt has no new complaints   Review of Systems  Constitutional: Negative for appetite change, diaphoresis, fatigue and unexpected weight change.  Eyes: Negative for pain, redness and visual disturbance.  Respiratory: Negative for cough, chest tightness, shortness of breath and wheezing.   Cardiovascular: Negative for chest pain, palpitations and leg swelling.  Endocrine: Negative for cold intolerance, heat intolerance, polydipsia, polyphagia and polyuria.  Genitourinary: Negative for difficulty urinating, dysuria and frequency.  Neurological: Positive for numbness. Negative for dizziness, light-headedness and headaches.    History Past Medical History:  Diagnosis Date  . Anemia    hx of   . Angiodysplasia 2007   @ colonoscopy  . Anxiety    PMH of  . Chronic low back pain 06/21/2014  . Diverticulosis of colon (without mention of hemorrhage)   . DJD (degenerative joint disease)   . Esophageal reflux    inactive  . History of vertebral fracture 11/2015  . HTN (hypertension)   . Hyperlipemia 2006   LDL 130  . Hypoglycemia    reactive  . Pelvic fracture ([place]) 12/30/2011   [facility]  . Peripheral neuropathy   . Personal history of colonic polyps    adenomatous  . Vitamin B12 deficiency     She has a past surgical history that includes Tear duct probing; Total hip arthroplasty (2000); Facial cosmetic surgery; Tubal ligation; Hemorroidectomy; Dilation and curettage of uterus; Colonoscopy w/ polypectomy; Lumbar laminectomy/decompression microdiscectomy (09/10/2011); ORIF wrist fracture (01/11/2012); cataract surgery (12-26-12 and 01-09-13); and Colonoscopy (2011).   Her family history includes Cancer in her  brother; Depression in her maternal uncle; Heart attack (age of onset: 27) in her brother; Heart attack (age of onset: 9) in her father; Kidney disease in her mother; Osteoporosis in her sister; Peripheral vascular disease in her daughter; Stroke (age of onset: 75) in her brother; Throat cancer in her sister.She reports that she has never smoked. She has never used smokeless tobacco. She reports current alcohol use of about 3.0 standard drinks of alcohol per week. She reports that she does not use drugs.  Current Outpatient Medications on File Prior to Visit  Medication Sig Dispense Refill  . amLODipine (NORVASC) 5 MG tablet Take 1 tablet (5 mg total) by mouth daily. 90 tablet 1  . apixaban (ELIQUIS) 5 MG TABS tablet Take 1 tablet (5 mg total) by mouth 2 (two) times daily. 60 tablet 3  . Ascorbic Acid (VITAMIN C) 1000 MG tablet Take 1,000 mg by mouth daily.     [name] aspirin 81 MG tablet Take 81 mg by mouth every evening.     . Calcium Carbonate (CALCIUM 600 PO) Take 1 capsule by mouth daily.     . Cholecalciferol (VITAMIN D) 2000 UNITS CAPS Take 1 capsule by mouth daily.     . furosemide (LASIX) 20 MG tablet TAKE 1 TABLET(20 MG) BY MOUTH DAILY 90 tablet 1  . HYDROcodone-acetaminophen (NORCO/VICODIN) 5-325 MG tablet Take 1 tablet by mouth every 6 (six) hours as needed for moderate pain or severe pain.   0  . losartan (COZAAR) 100 MG tablet TAKE 1 TABLET BY MOUTH EVERY DAY 90  tablet 1  . LYRICA 100 MG capsule Take 1 capsule (100 mg total) by mouth at bedtime. (Patient taking differently: Take 100 mg by mouth at bedtime. Take along with 25 mg capsule=125 mg) 30 capsule 5  . LYRICA 25 MG capsule Take 1 capsule (25 mg total) by mouth at bedtime. Take along with 100 mg capsule=125 mg 30 capsule 5  . nortriptyline (PAMELOR) 10 MG capsule Take 1 capsule (10 mg total) by mouth at bedtime. 60 capsule 3  . Polyethyl Glycol-Propyl Glycol (SYSTANE ULTRA OP) Apply 1 drop to eye daily as needed (for dry eyes).    [name]  apixaban (ELIQUIS) 5 MG TABS tablet Take 2 tablets (10 mg total) by mouth 2 (two) times daily for 4 days. 16 tablet 0   No current facility-administered medications on file prior to visit.     Objective:  Objective  Physical Exam Vitals and nursing note reviewed.  Constitutional:      Appearance: She is well-developed.  HENT:     Head: Normocephalic and atraumatic.  Eyes:     Conjunctiva/sclera: Conjunctivae normal.  Neck:     Thyroid: No thyromegaly.     Vascular: No carotid bruit or JVD.  Cardiovascular:     Rate and Rhythm: Normal rate. Rhythm irregular.     Heart sounds: Normal heart sounds. No murmur heard.   Pulmonary:     Effort: Pulmonary effort is normal. No respiratory distress.     Breath sounds: Normal breath sounds. No wheezing or rales.  Chest:     Chest wall: No tenderness.  Musculoskeletal:     Cervical back: Normal range of motion and neck supple.  Neurological:     Mental Status: She is alert and oriented to person, place, and time.    BP 120/86 (BP Location: Right Arm, Patient Position: Sitting, Cuff Size: Normal)   Pulse 74   Temp 97.7 F (36.5 C) (Oral)   Resp 18   Ht 5\' 5"  (1.651 m)   Wt 171 lb 6.4 oz (77.7 kg)   SpO2 97%   BMI 28.52 kg/m  Wt Readings from Last 3 Encounters:  01/03/20 171 lb 6.4 oz (77.7 kg)  11/12/19 178 lb 12.7 oz (81.1 kg)  07/02/19 182 lb (82.6 kg)     Lab Results  Component Value Date   WBC 6.8 11/12/2019   HGB 11.4 (L) 11/12/2019   HCT 35.1 (L) 11/12/2019   PLT 325 11/12/2019   GLUCOSE 96 11/12/2019   CHOL 146 11/09/2019   TRIG 48 11/09/2019   HDL 55 11/09/2019   LDLDIRECT 129.0 10/18/2014   LDLCALC 81 11/09/2019   ALT 11 07/02/2019   AST 11 07/02/2019   NA 131 (L) 11/12/2019   K 5.0 11/12/2019   CL 99 11/12/2019   CREATININE 1.79 (H) 11/12/2019   BUN 38 (H) 11/12/2019   CO2 22 11/12/2019   TSH 2.432 11/09/2019   INR 1.4 (H) 11/08/2019   HGBA1C 5.7 (H) 11/09/2019    DG Chest 2 View  Result Date:  11/08/2019 CLINICAL DATA:  Chest pain. EXAM: CHEST - 2 VIEW COMPARISON:  07/22/2015. FINDINGS: Cardiomegaly with mild pulmonary venous congestion. Right upper lobe infiltrate/edema. No pleural effusion or pneumothorax. Sliding hiatal hernia. No acute bony abnormality. IMPRESSION: 1.  Cardiomegaly with pulmonary venous congestion. 2. Right upper lobe infiltrate/edema. Right upper lobe pneumonia cannot be excluded. 3.  Sliding hiatal hernia. Electronically Signed   By: Marcello Moores  Register   On: 11/08/2019 13:44   NM Pulmonary  Perfusion  Result Date: 11/08/2019 CLINICAL DATA:  Shortness of breath and elevated D-dimer. EXAM: NUCLEAR MEDICINE PERFUSION LUNG SCAN TECHNIQUE: Perfusion images were obtained in multiple projections after intravenous injection of radiopharmaceutical. Ventilation scans intentionally deferred if perfusion scan and chest x-ray adequate for interpretation during COVID 19 epidemic. RADIOPHARMACEUTICALS:  4.2 mCi Tc-71m MAA IV COMPARISON:  Chest plain film, dated November 08, 2019, is available for comparison. FINDINGS: A small area of nonsegmental decreased perfusion is seen along the posterior aspect of the upper left lung. This is best seen on the posterior and left lateral views. No corresponding abnormalities are seen within this region on the recent chest plain film. Homogeneous distribution of tracer uptake is noted throughout the remainder of both lungs. IMPRESSION: 1. Small area of decreased perfusion seen along the posterior aspect of the left upper lobe. Sequelae associated with a small pulmonary embolism cannot be excluded. Further evaluation with chest CTA is recommended. Electronically Signed   By: Virgina Norfolk M.D.   On: 11/08/2019 19:55   ECHOCARDIOGRAM COMPLETE  Result Date: 11/09/2019    ECHOCARDIOGRAM REPORT   Patient Name:   EVELLYN TUFF Date of Exam: 11/09/2019 Medical Rec #:  [id number]         Height:       65.0 in Accession #:    [id number]        Weight:       185.0 lb  Date of Birth:  1932/06/24          BSA:          1.914 m Patient Age:    32 years          BP:           122/77 mmHg Patient Gender: F                 HR:           73 bpm. Exam Location:  Inpatient Procedure: 2D Echo, Color Doppler and Cardiac Doppler Indications:    I26.02 Pulmonary embolus  History:        Patient has no prior history of Echocardiogram examinations.                 Risk Factors:Hypertension and Dyslipidemia.  Sonographer:    Raquel Sarna Senior RDCS Referring Phys: [place]  Sonographer Comments: Technically difficult study due to poor echo windows and hiatal hernia. IMPRESSIONS  1. Mild global reduction in LV systolic function; grade 1 diastolic dysfunction; right heart not well visualized.  2. Left ventricular ejection fraction, by estimation, is 45 to 50%. The left ventricle has mildly decreased function. The left ventricle demonstrates global hypokinesis. Left ventricular diastolic parameters are consistent with Grade I diastolic dysfunction (impaired relaxation). Elevated left atrial pressure.  3. Right ventricular systolic function is normal. The right ventricular size is normal. Tricuspid regurgitation signal is inadequate for assessing PA pressure.  4. Left atrial size was moderately dilated.  5. The mitral valve is normal in structure. Mild mitral valve regurgitation. No evidence of mitral stenosis.  6. The aortic valve is tricuspid. Aortic valve regurgitation is mild. No aortic stenosis is present.  7. Aortic dilatation noted. There is mild dilatation of the aortic root measuring 39 mm.  8. The inferior vena cava is normal in size with greater than 50% respiratory variability, suggesting right atrial pressure of 3 mmHg. FINDINGS  Left Ventricle: Left ventricular ejection fraction, by estimation, is 45 to 50%. The left ventricle  has mildly decreased function. The left ventricle demonstrates global hypokinesis. The left ventricular internal cavity size was normal in size. There  is  no left ventricular hypertrophy. Left ventricular diastolic function could not be evaluated due to atrial fibrillation. Left ventricular diastolic parameters are consistent with Grade I diastolic dysfunction (impaired relaxation). Elevated left atrial pressure. Right Ventricle: The right ventricular size is normal.Right ventricular systolic function is normal. Tricuspid regurgitation signal is inadequate for assessing PA pressure. The tricuspid regurgitant velocity is 1.95 m/s, and with an assumed right atrial pressure of 3 mmHg, the estimated right ventricular systolic pressure is 76.7 mmHg. Left Atrium: Left atrial size was moderately dilated. Right Atrium: Right atrial size was normal in size. Pericardium: There is no evidence of pericardial effusion. Mitral Valve: The mitral valve is normal in structure. Normal mobility of the mitral valve leaflets. Mild mitral annular calcification. Mild mitral valve regurgitation. No evidence of mitral valve stenosis. Tricuspid Valve: The tricuspid valve is normal in structure. Tricuspid valve regurgitation is trivial. No evidence of tricuspid stenosis. Aortic Valve: The aortic valve is tricuspid. Aortic valve regurgitation is mild. No aortic stenosis is present. Pulmonic Valve: The pulmonic valve was normal in structure. Pulmonic valve regurgitation is trivial. No evidence of pulmonic stenosis. Aorta: Aortic dilatation noted. There is mild dilatation of the aortic root measuring 39 mm. Venous: The inferior vena cava is normal in size with greater than 50% respiratory variability, suggesting right atrial pressure of 3 mmHg.  Additional Comments: Mild global reduction in LV systolic function; grade 1 diastolic dysfunction; right heart not well visualized.  LEFT VENTRICLE PLAX 2D LVIDd:         4.97 cm     Diastology LVIDs:         2.90 cm     LV e' lateral:   8.38 cm/s LV PW:         0.82 cm     LV E/e' lateral: 9.0 LV IVS:        1.00 cm     LV e' medial:    5.00 cm/s LVOT  diam:     2.10 cm     LV E/e' medial:  15.1 LV SV:         62 LV SV Index:   32 LVOT Area:     3.46 cm  LV Volumes (MOD) LV vol d, MOD A4C: 95.0 ml LV vol s, MOD A4C: 44.2 ml LV SV MOD A4C:     95.0 ml RIGHT VENTRICLE RV S prime:     12.00 cm/s TAPSE (M-mode): 2.1 cm LEFT ATRIUM             Index       RIGHT ATRIUM           Index LA diam:        4.30 cm 2.25 cm/m  RA Area:     16.30 cm LA Vol (A2C):   97.4 ml 50.90 ml/m RA Volume:   41.00 ml  21.42 ml/m LA Vol (A4C):   89.1 ml 46.56 ml/m LA Biplane Vol: 98.8 ml 51.63 ml/m  AORTIC VALVE LVOT Vmax:   76.10 cm/s LVOT Vmean:  57.300 cm/s LVOT VTI:    0.179 m  AORTA Ao Root diam: 3.40 cm Ao Asc diam:  3.85 cm MITRAL VALVE               TRICUSPID VALVE MV Area (PHT): 3.72 cm    TR Peak grad:   15.2 mmHg  MV Decel Time: 204 msec    TR Vmax:        195.00 cm/s MV E velocity: 75.70 cm/s MV A velocity: 86.30 cm/s  SHUNTS MV E/A ratio:  0.88        Systemic VTI:  0.18 m                            Systemic Diam: 2.10 cm Kirk Ruths MD Electronically signed by Kirk Ruths MD Signature Date/Time: 11/09/2019/2:53:34 PM    Final    VAS [place] LOWER EXTREMITY VENOUS (DVT)  Result Date: 11/10/2019  Lower Venous DVTStudy Indications: Pulmonary embolism.  Limitations: Poor ultrasound/tissue interface. Comparison Study: No prior study Performing Technologist: Maudry Mayhew MHA, RDMS, RVT, RDCS  Examination Guidelines: A complete evaluation includes B-mode imaging, spectral Doppler, color Doppler, and power Doppler as needed of all accessible portions of each vessel. Bilateral testing is considered an integral part of a complete examination. Limited examinations for reoccurring indications may be performed as noted. The reflux portion of the exam is performed with the patient in reverse Trendelenburg.  +---------+---------------+---------+-----------+----------+--------------+ RIGHT    CompressibilityPhasicitySpontaneityPropertiesThrombus Aging  +---------+---------------+---------+-----------+----------+--------------+ CFV      Full           Yes      Yes                                 +---------+---------------+---------+-----------+----------+--------------+ SFJ      Full                                                        +---------+---------------+---------+-----------+----------+--------------+ FV Prox  Full                                                        +---------+---------------+---------+-----------+----------+--------------+ FV Mid   Full                                                        +---------+---------------+---------+-----------+----------+--------------+ FV DistalFull                                                        +---------+---------------+---------+-----------+----------+--------------+ PFV      Full                                                        +---------+---------------+---------+-----------+----------+--------------+ POP      Full           Yes      Yes                                 +---------+---------------+---------+-----------+----------+--------------+  PTV      Full                                                        +---------+---------------+---------+-----------+----------+--------------+ PERO     Full                                                        +---------+---------------+---------+-----------+----------+--------------+   +---------+---------------+---------+-----------+----------+--------------+ LEFT     CompressibilityPhasicitySpontaneityPropertiesThrombus Aging +---------+---------------+---------+-----------+----------+--------------+ CFV      Full           Yes      Yes                                 +---------+---------------+---------+-----------+----------+--------------+ SFJ      Full                                                         +---------+---------------+---------+-----------+----------+--------------+ FV Prox  Full                                                        +---------+---------------+---------+-----------+----------+--------------+ FV Mid   Full                                                        +---------+---------------+---------+-----------+----------+--------------+ FV DistalFull                                                        +---------+---------------+---------+-----------+----------+--------------+ PFV      Full                                                        +---------+---------------+---------+-----------+----------+--------------+ POP      Full           Yes      Yes                                 +---------+---------------+---------+-----------+----------+--------------+ PTV      Full                                                        +---------+---------------+---------+-----------+----------+--------------+   PERO     Full                                                        +---------+---------------+---------+-----------+----------+--------------+     Summary: RIGHT: - There is no evidence of deep vein thrombosis in the lower extremity.  - No cystic structure found in the popliteal fossa.  LEFT: - There is no evidence of deep vein thrombosis in the lower extremity.  - No cystic structure found in the popliteal fossa.  *See table(s) above for measurements and observations. Electronically signed by Harold Barban MD on 11/10/2019 at 1:17:17 AM.    Final      Assessment & Plan:  Plan  I have discontinued [place]. [employer]. [place]"'s polyethylene glycol, fluticasone, Valtrex, and ALPRAZolam. I am also having her maintain her aspirin, Calcium Carbonate (CALCIUM 600 PO), vitamin C, Vitamin D, Polyethyl Glycol-Propyl Glycol (SYSTANE ULTRA OP), HYDROcodone-acetaminophen, amLODipine, furosemide, Lyrica, nortriptyline, losartan,  apixaban, apixaban, and Lyrica.  No orders of the defined types were placed in this encounter.   Problem List Items Addressed This Visit      Unprioritized   Acute pulmonary embolism ([place])    On eliquis F/u cardiology due to a fib in hosp       AF (paroxysmal atrial fibrillation) ([place])    On eliquis  f/u cardiology---- recommended at [place] d/c put app not made       Essential hypertension    Well controlled, no changes to meds. Encouraged heart healthy diet such as the DASH diet and exercise as tolerated.  norvasc and losartan       HYPERLIPIDEMIA    Encouraged heart healthy diet, increase exercise, avoid trans fats, consider a krill oil cap daily      Vitamin B 12 deficiency   Relevant Orders   Vitamin B12    Other Visit Diagnoses    Atrial fibrillation, unspecified type ([place])    -  Primary   Relevant Orders   Lipid panel   Comprehensive metabolic panel   Ambulatory referral to Cardiology   Vitamin D deficiency       Need for influenza vaccination       Relevant Orders   Flu Vaccine QUAD High Dose(Fluad) (Completed)   Pulmonary embolism and infarction [facility])       Relevant Orders   Ambulatory referral to Cardiology      Follow-up: Return in about 4 weeks (around 01/31/2020).  Ann Held, DO

## 2020-01-04 LAB — COMPREHENSIVE METABOLIC PANEL
AG Ratio: 1.4 (calc) (ref 1.0–2.5)
ALT: 7 U/L (ref 6–29)
AST: 12 U/L (ref 10–35)
Albumin: 4 g/dL (ref 3.6–5.1)
Alkaline phosphatase (APISO): 58 U/L (ref 37–153)
BUN/Creatinine Ratio: 12 (calc) (ref 6–22)
BUN: 26 mg/dL — ABNORMAL HIGH (ref 7–25)
CO2: 16 mmol/L — ABNORMAL LOW (ref 20–32)
Calcium: 9.6 mg/dL (ref 8.6–10.4)
Chloride: 103 mmol/L (ref 98–110)
Creat: 2.09 mg/dL — ABNORMAL HIGH (ref 0.60–0.88)
Globulin: 2.9 g/dL (calc) (ref 1.9–3.7)
Glucose, Bld: 96 mg/dL (ref 65–99)
Potassium: 4.7 mmol/L (ref 3.5–5.3)
Sodium: 137 mmol/L (ref 135–146)
Total Bilirubin: 0.5 mg/dL (ref 0.2–1.2)
Total Protein: 6.9 g/dL (ref 6.1–8.1)

## 2020-01-04 LAB — LIPID PANEL
Cholesterol: 196 mg/dL (ref <200)
HDL: 66 mg/dL (ref >=50)
LDL Cholesterol (Calc): 109 mg/dL (calc) — ABNORMAL HIGH
Non-HDL Cholesterol (Calc): 130 mg/dL (calc) — ABNORMAL HIGH (ref <130)
Total CHOL/HDL Ratio: 3 (calc) (ref <5.0)
Triglycerides: 106 mg/dL (ref <150)

## 2020-01-04 LAB — VITAMIN B12: Vitamin B-12: 429 pg/mL (ref 200–1100)

## 2020-01-10 ENCOUNTER — Ambulatory Visit: Payer: Medicare Other | Admitting: Pharmacist

## 2020-01-10 DIAGNOSIS — G609 Hereditary and idiopathic neuropathy, unspecified: Secondary | ICD-10-CM

## 2020-01-10 DIAGNOSIS — I10 Essential (primary) hypertension: Secondary | ICD-10-CM

## 2020-01-10 DIAGNOSIS — E782 Mixed hyperlipidemia: Secondary | ICD-10-CM

## 2020-01-10 DIAGNOSIS — I5043 Acute on chronic combined systolic (congestive) and diastolic (congestive) heart failure: Secondary | ICD-10-CM

## 2020-01-10 NOTE — Chronic Care Management (AMB) (Signed)
Chronic Care Management Pharmacy  Name: [name]  MRN: [id number] DOB: [birth year]  Chief Complaint/ HPI  Krystal Henderson,  86 y.o. , female presents for their Follow-Up CCM visit with the clinical pharmacist via telephone due to COVID-19 Pandemic.  PCP : Ann Held, DO  Their chronic conditions include: Hypertension, Hyperlipidemia, Pre-DM, AFib, Heart Failure, Neuropathy, Anxiety, Back Pain, Osteoporosis  Office Visits: 01/03/20: Visit w/ Dr. Etter Sjogren - Pt to follow up with cardiology. Referral to cardio. Flu vaccine given. B12 drawn and WNL. No med changes noted.   Consult Visit: None since last CCM visit on 12/05/2019.    Medications: Outpatient Encounter Medications as of 01/10/2020  Medication Sig   amLODipine (NORVASC) 5 MG tablet Take 1 tablet (5 mg total) by mouth daily.   apixaban (ELIQUIS) 5 MG TABS tablet Take 1 tablet (5 mg total) by mouth 2 (two) times daily.   Ascorbic Acid (VITAMIN C) 1000 MG tablet Take 1,000 mg by mouth daily.    aspirin 81 MG tablet Take 81 mg by mouth every evening.    Calcium Carbonate (CALCIUM 600 PO) Take 1 capsule by mouth daily.    Cholecalciferol (VITAMIN D) 2000 UNITS CAPS Take 1 capsule by mouth daily.    HYDROcodone-acetaminophen (NORCO/VICODIN) 5-325 MG tablet Take 1 tablet by mouth every 6 (six) hours as needed for moderate pain or severe pain.    losartan (COZAAR) 100 MG tablet TAKE 1 TABLET BY MOUTH EVERY Akiba Melfi   LYRICA 100 MG capsule Take 1 capsule (100 mg total) by mouth at bedtime. (Patient taking differently: Take 100 mg by mouth at bedtime. Take along with 25 mg capsule=125 mg)   LYRICA 25 MG capsule Take 1 capsule (25 mg total) by mouth at bedtime. Take along with 100 mg capsule=125 mg   nortriptyline (PAMELOR) 10 MG capsule Take 1 capsule (10 mg total) by mouth at bedtime.   Polyethyl Glycol-Propyl Glycol (SYSTANE ULTRA OP) Apply 1 drop to eye daily as needed (for dry eyes).   [DISCONTINUED] apixaban  (ELIQUIS) 5 MG TABS tablet Take 2 tablets (10 mg total) by mouth 2 (two) times daily for 4 days.   [DISCONTINUED] furosemide (LASIX) 20 MG tablet TAKE 1 TABLET(20 MG) BY MOUTH DAILY   No facility-administered encounter medications on file as of 01/10/2020.   SDOH Screenings   Alcohol Screen:    Last Alcohol Screening Score (AUDIT): Not on file  Depression (PHQ2-9): Low Risk    PHQ-2 Score: 0  Financial Resource Strain: Medium Risk   Difficulty of Paying Living Expenses: Somewhat hard  Food Insecurity:    Worried About Charity fundraiser in the Last Year: Not on file   [employer] of Food in the Last Year: Not on file  Housing:    Last Housing Risk Score: Not on file  Physical Activity:    Days of Exercise per Week: Not on file   Minutes of Exercise per Session: Not on file  Social Connections:    Frequency of Communication with Friends and Family: Not on file   Frequency of Social Gatherings with Friends and Family: Not on file   Attends Religious Services: Not on file   Active Member of Clubs or Organizations: Not on file   Attends Archivist Meetings: Not on file   Marital Status: Not on file  Stress:    Feeling of Stress : Not on file  Tobacco Use: Low Risk    Smoking Tobacco Use: Never  Smoker   Smokeless Tobacco Use: Never Used  Transportation Needs:    Film/video editor (Medical): Not on file   Lack of Transportation (Non-Medical): Not on file     Current Diagnosis/Assessment:  Goals Addressed            This Visit's Progress    Chronic Care Management Pharmacy Care Plan       CARE PLAN ENTRY (see longitudinal plan of care for additional care plan information)  Current Barriers:   Chronic Disease Management support, education, and care coordination needs related to Hypertension, Hyperlipidemia, Pre-DM, AFib, Heart Failure, Neuropathy, Anxiety, Back Pain, Osteoporosis   Hypertension BP Readings from Last 3 Encounters:    02/01/20 120/80  01/21/20 132/72  01/03/20 120/86    Pharmacist Clinical Goal(s): o Over the next 90days, patient will work with PharmD and providers to maintain BP goal <140/90  Current regimen:   Amlodipine 90m daily   Losartan 1045mdaily  Interventions: o Discussed BP goals  Patient self care activities - Over the next 90 days, patient will: o Maintain hypertension medication regimen.   Hyperlipidemia Lab Results  Component Value Date/Time   LDLCALC 109 (H) 01/03/2020 04:05 PM   LDLDIRECT 129.0 10/18/2014 11:56 AM    Pharmacist Clinical Goal(s): o Over the next 90 days, patient will work with PharmD and providers to maintain LDL goal < 100  Current regimen:  o Diet and exercise management    Interventions: o Discussed LDL goal  Patient self care activities - Over the next 90 days, patient will: o Maintain diet and exercise regiment  Diabetes Lab Results  Component Value Date/Time   HGBA1C 5.7 (H) 11/09/2019 01:50 AM   HGBA1C 5.6 08/24/2011 10:18 AM    Pharmacist Clinical Goal(s): o Over the next 90 days, patient will work with PharmD and providers to maintain A1c goal <6.5%  Current regimen:  o Diet and exercise management    Interventions: o Discussed a1c goal  Patient self care activities - Over the next 90 days, patient will: o Maintain a1c <6.5%  Heart Failure  Pharmacist Clinical Goal(s) o Over the next 90 days, patient will work with PharmD and providers to reduce symptoms associated with heart failure  Current regimen:  o Furosemide 2056maily  Interventions: o Encouraged patient to weigh daily o Educated patient to notify office if she gains more than 3lbs in one Tyera Hansley or 5lbs in one week  Patient self care activities - Over the next 90 days, patient will: o Weigh daily  o Notify office if she gains more than 3lbs in one Ashli Selders or 5lbs in one week  Neuropathy  Pharmacist Clinical Goal(s) o Over the next 90 days, patient will work  with PharmD and providers to reduce symptoms associated with neuropathy  Current regimen:   Lyrica 100m74mily at bedtime  Lyrica 25mg16mly at bedtime  Nortriptyline 10mg 74my at   Interventions: o Discussed that patient was deficient in Vitamin B12 and this could contribute to her neuropathy o Encouraged patient to try Vitamin B12 1000mcg 18my over the counter o Collaboration with provider regarding medication management (consider rechecking Vitamin B12)  Patient self care activities - Over the next 90 days, patient will: o Have B12 rechecked at next office visit o Consider taking Vitamin B12 1000mcg  42meoporosis  Pharmacist Clinical Goal(s) o Over the next 90 days, patient will work with PharmD and providers to reduce risk of fracture associated with osteoporosis  Current regimen:  Calcium Carbonate 634m #2 daily   Vitamin D 2000 units once daily  Interventions: o Consider taking calcium twice daily vs #2 tabs daily to increase absorption o Consider repeat DEXA Scan  Patient self care activities - Over the next 90 days, patient will: o Consider taking calcium twice daily vs #2 tabs daily to increase absorption o Consider repeat DEXA Scan  Health Maintenance   Pharmacist Clinical Goal(s) o Over the next 180 days, patient will work with PharmD and providers to complete health maintenance screenings/vaccinations  Interventions: o Discussed Shingrix Vaccine Series  Patient self care activities - Over the next 180 days, patient will: o Complete Shingrix vaccine series  Medication management  Pharmacist Clinical Goal(s): o Over the next 90 days, patient will work with PharmD and providers to maintain optimal medication adherence  Current pharmacy: [facility]  Interventions o Comprehensive medication review performed. o Continue current medication management strategy  Patient self care activities - Over the next 90 days, patient will: o Focus on  medication adherence by filling and taking medications appropriately  o Take medications as prescribed o Report any questions or concerns to PharmD and/or provider(s)  Please see past updates related to this goal by clicking on the "Past Updates" button in the selected goal       Social Hx:  [place] Mar[place] 57 years.  Had one daughter who passed away. Also had a grandson and he passed away. Does have a great grandson (136yrs old; [place]. No pets.  Hypertension   BP goal is:  <140/90  Office blood pressures are  BP Readings from Last 3 Encounters:  01/03/20 120/86  11/12/19 (!) 88/52  07/02/19 (!) 160/80   Patient checks BP at home infrequently Patient home BP readings are ranging: 130/60 this morning. States it has been normal when PT comes in and checks it.  Patient has failed these meds in the past: quinapril (listed in D/C meds. No apparent reason for D/C) Patient is currently controlled on the following medications:   Amlodipine 562mdaily   Losartan 10064maily  Fill Dates Per Dispense Report - Filled Appropriately Amlodipine 10/28/19 - 90 DS 07/28/19 - 90 DS  Losartan 11/01/19 - 90 DS 07/28/19 - 90 DS  We discussed BP goals   Update 01/10/20 Had a therapist coming to her home twice a week and was told her BP was always good.  Plan -Continue current medications     Hyperlipidemia   LDL goal < 100  Lipid Panel     Component Value Date/Time   CHOL 196 01/03/2020 1605   TRIG 106 01/03/2020 1605   HDL 66 01/03/2020 1605   LDLCALC 109 (H) 01/03/2020 1605   LDLDIRECT 129.0 10/18/2014 1156    Hepatic Function Latest Ref Rng & Units 01/03/2020 07/02/2019 12/29/2018  Total Protein 6.1 - 8.1 g/dL 6.9 6.5 6.6  Albumin 3.5 - 5.2 g/dL - 3.9 -  AST 10 - 35 U/L _0 ALT 6 - 29 U/L _1 Alk Phosphatase 39 - 117 U/L - 53 -  Total Bilirubin 0.2 - 1.2 mg/dL 0.5 0.4 0.5  Bilirubin, Direct 0.0 - 0.3 mg/dL - - -     The ASCVD Risk  score ([place]of[place]et al., 2013) failed to calculate for the following reasons:   The 2013 ASCVD risk score is only valid for ages 40 42 79 68Patient has failed these meds in past: None  noted  Patient is currently uncontrolled on the following medications:   None  We discussed:  LDL goal   Update 01/10/20 LDL increased from previous 81 on 11/09/19 to 109 on 01/03/20.  Considering patient's age, do not recommend statin initiation for primary prevention  Plan -Continue control with diet and exercise   AFIB   CMP Latest Ref Rng & Units 01/03/2020 11/12/2019 11/11/2019  Glucose 65 - 99 mg/dL 96 96 95  BUN 7 - 25 mg/dL 26(H) 38(H) 37(H)  Creatinine 0.60 - 0.88 mg/dL 2.09(H) 1.79(H) 1.87(H)  Sodium 135 - 146 mmol/L 137 131(L) 132(L)  Potassium 3.5 - 5.3 mmol/L 4.7 5.0 4.9  Chloride 98 - 110 mmol/L 103 99 101  CO2 20 - 32 mmol/L 16(L) 22 22  Calcium 8.6 - 10.4 mg/dL 9.6 8.6(L) 8.7(L)  Total Protein 6.1 - 8.1 g/dL 6.9 - -  Total Bilirubin 0.2 - 1.2 mg/dL 0.5 - -  Alkaline Phos 39 - 117 U/L - - -  AST 10 - 35 U/L 12 - -  ALT 6 - 29 U/L 7 - -   Patient is currently neither controlled.  Patient has failed these meds in past: None noted  Patient is currently controlled on the following medications:  Eliquis 37m twice daily  Cost $133/month  For indication of PE treatment patient is on appropriate dose (No dose adjustment recommended; 580mBID).  For indication of Afib, recommended dose would be 2.30m8mID noting patient age >80>14nd Scr >1.5   We discussed:  Dosing of Eliquis based on indications   Update 01/10/20 Scheduled follow up with cardiology? Yes on 01/21/20  Plan -Continue current medications   Future Plan -Consider completing patient assistance application for Eliquis  Heart Failure   Type: Combined Systolic and Diastolic  Last ejection fraction: 11/09/19 45-50% NYHA Class: I (no actitivty limitation) AHA HF Stage: B (Heart disease present - no symptoms  present)  Patient has failed these meds in past: None noted  Patient is currently controlled on the following medications:  Furosemide 26m37mily  Reports she has bilateral edema at baseline. Legs are worse when she has her legs hanging down.  Denies SOB. Does not weigh daily. Acknowledges she was instructed to weigh daily. Reinforced daily weights  We discussed weighing daily; if you gain more than 3 pounds in one Emoni Whitworth or 5 pounds in one week call your doctor   Update 01/10/20 Weighing daily? No, weighed when therapy came into the home. Feels her weight was stable  Plan -Continue current medications  -Weigh self daily  Neuropathy   Followed by Neuropathy ([place])  Vitamin B12 - 01/03/20 - 429  Patient has failed these meds in past: pregabalin (ineffective), gabapentin (ineffective), duloxetine (listed in allergies as did not feel well) Patient is currently uncontrolled on the following medications:  Lyrica 100mg63mly at bedtime  Lyrica 230mg 72my at bedtime  Nortriptyline 10mg d71m at bedtime  Is getting home PT. She feels it is helping. States she takes 230mg Ly33m in the morning and 100mg at 49mime Has taken lyrica for years. Nortriptyline is more recent. States Lyrica is expensive $68 230mg $10070mmg  We d72mssed:  how she is deficient and B12 and this could be a contributing factor to her neuropathy. Recommended patient to start B12 1000mcg daily8m get B12 rechecked at visit w/ Dr. Lowne on 9/3Etter Sjogren  Update 01/10/20 Started B12 after last visit. Feet are not feeling any better. Going to neurologist on  02/20/20. She gets up 2-3 times per night to urinate. Could consider titrating nortriptyline to 98m daily, but this might be limited due to her feelings of drowsiness throughout the Ernisha Sorn. She would actually like to taper off of nortryptyline.  We discussed use of alpha lipoic acid. Alpha lipoic acid reported to be possibly effective for neuropathy  at doses of 600-18045mdaily in one to 3 divided doses. Effects not seen for 3-5 weeks.   Considerations for a Plan -Stop nortriptyline, start duloxetine (pt open to this option possibly) AND/OR -Start alpha lipoic acid 60062maily for at least 5 weeks (no drug interactions noted)  Plan -Continue current medications   Anxiety   Patient has failed these meds in past: None noted  Patient is currently controlled on the following medications:  Alprazolam 0.5m49mery 8 to 12 hours as needed   Doesn't feel her anxiety is going very well. Takes a 1/2 tab when needed. Prescription lasts her a long time.  She doesn't want to get hooked. They don't have a lot family around and she worries what will happen to her husband if she dies. GAD7 Score: 4  Plan -Continue current medications    Vaccines   Reviewed and discussed patient's vaccination history.    Immunization History  Administered Date(s) Administered   Fluad Quad(high Dose 65+) 12/29/2018, 01/03/2020   Influenza Split 01/01/2012   Influenza Whole 01/03/2008, 12/30/2009   Influenza, High Dose Seasonal PF 01/04/2013, 01/08/2014, 12/22/2017   Influenza-Unspecified 12/19/2014, 12/25/2015, 01/11/2017   PFIZER SARS-COV-2 Vaccination 05/14/2019, 06/08/2019, 02/01/2020   Pneumococcal Conjugate-13 02/11/2014   Pneumococcal Polysaccharide-23 12/30/2009, 01/01/2012   Tdap 03/15/2011   Zoster 04/30/2013   Update 01/10/20 Patient received flu vaccine at 01/03/20 visit w/ Dr. LownEtter Sjogrenan -Recommended patient receive Shingrix vaccine in pharmacy.   Medication Management   [facility] Uses pill box? Yes has an AM and PM box Pt endorses 100% compliance  We discussed: Discussed benefits of medication synchronization, packaging and delivery as well as enhanced pharmacist oversight with [employer]. Patient would like to think about it.  Miscellaneous Meds Vitamin C 1000mg39mtane eye  drop  Meds to D/C from list Miralax (uses prunes and dates cooked together; she takes 2 tablespoons per Brandonlee Navis) Fluticasone 50mcg13mal spray (not effective) Valtrex 1000mg A38min 81mg (s65md be D/C per message from 11/16/19)   Plan  Continue current medication management strategy   Follow up:  1 month phone visit  Lynnlee Revels Melvenia BeamarmD Clinical Pharmacist [place] [phone]

## 2020-01-21 ENCOUNTER — Encounter: Payer: Self-pay | Admitting: Cardiology

## 2020-01-21 ENCOUNTER — Encounter: Payer: Self-pay | Admitting: *Deleted

## 2020-01-21 ENCOUNTER — Ambulatory Visit: Payer: Medicare Other | Admitting: Cardiology

## 2020-01-21 ENCOUNTER — Other Ambulatory Visit: Payer: Self-pay

## 2020-01-21 VITALS — BP 132/72 | HR 72 | Ht 65.0 in | Wt 178.6 lb

## 2020-01-21 DIAGNOSIS — I480 Paroxysmal atrial fibrillation: Secondary | ICD-10-CM

## 2020-01-21 MED ORDER — METOPROLOL SUCCINATE ER 25 MG PO TB24: 25.0000 mg | ORAL_TABLET | Freq: Every day | ORAL | 3 refills | Status: DC

## 2020-01-21 NOTE — Patient Instructions (Signed)
Medication Instructions:  Your physician has recommended you make the following change in your medication:  1. START Metoprolol Succinate (Toprol) 25 mg once daily  *If you need a refill on your cardiac medications before your next appointment, please call your pharmacy*   Lab Work: None ordered   Testing/Procedures: Your physician has recommended that you wear a 2 week  ZIO-PATCH monitor. The Zio patch cardiac monitor continuously records heart rhythm data, this is for patients being evaluated for multiple types heart rhythms. For the first 24 hours post application, please avoid getting the Zio monitor wet in the shower or by excessive sweating during exercise. After that, feel free to carry on with regular activities. Keep soaps and lotions away from the ZIO XT Patch.  Someone will be calling you to let you know when this is mailed.  This will be mailed to you, please expect 7-10 days to receive.      Call [place] at [phone] if you have questions regarding your ZIO XT patch monitor.  Call them immediately if you see an orange light blinking on your monitor.   If your monitor falls off in less than 4 days contact our Monitor department at [phone].  If your monitor becomes loose or falls off after 4 days call [employer] at [phone] for suggestions on securing your monitor   Follow-Up: At [facility], you and your health needs are our priority.  As part of our continuing mission to provide you with exceptional heart care, we have created designated Provider Care Teams.  These Care Teams include your primary Cardiologist (physician) and Advanced Practice Providers (APPs -  Physician Assistants and Nurse Practitioners) who all work together to provide you with the care you need, when you need it.  Your next appointment:   3 month(s)  The format for your next appointment:   In Person  Provider:   Allegra Lai, MD    Thank you for choosing  [facility]!!   Trinidad Curet, RN [phone]   Other Instructions  ZIO XT- Long Term Monitor Instructions   Your physician has requested you wear your ZIO patch monitor_______days.   This is a single patch monitor.  [employer] supplies one patch monitor per enrollment.  Additional stickers are not available.   Please do not apply patch if you will be having a Nuclear Stress Test, Echocardiogram, Cardiac CT, MRI, or Chest Xray during the time frame you would be wearing the monitor. The patch cannot be worn during these tests.  You cannot remove and re-apply the ZIO XT patch monitor.   Your ZIO patch monitor will be sent USPS Priority mail from [facility] directly to your home address. The monitor may also be mailed to a PO BOX if home delivery is not available.   It may take 3-5 days to receive your monitor after you have been enrolled.   Once you have received you monitor, please review enclosed instructions.  Your monitor has already been registered assigning a specific monitor serial # to you.   Applying the monitor   Shave hair from upper left chest.   Hold abrader disc by orange tab.  Rub abrader in 40 strokes over left upper chest as indicated in your monitor instructions.   Clean area with 4 enclosed alcohol pads .  Use all pads to assure are is cleaned thoroughly.  Let dry.   Apply patch as indicated in monitor instructions.  Patch will be place under collarbone on  left side of chest with arrow pointing upward.   Rub patch adhesive wings for 2 minutes.Remove white label marked "1".  Remove white label marked "2".  Rub patch adhesive wings for 2 additional minutes.   While looking in a mirror, press and release button in center of patch.  A small green light will flash 3-4 times .  This will be your only indicator the monitor has been turned on.     Do not shower for the first 24 hours.  You may shower after the first 24 hours.   Press button if you feel a  symptom. You will hear a small click.  Record Date, Time and Symptom in the Patient Log Book.   When you are ready to remove patch, follow instructions on last 2 pages of Patient Log Book.  Stick patch monitor onto last page of Patient Log Book.   Place Patient Log Book in [place] box.  Use locking tab on box and tape box closed securely.  The [employer] has [employer] on it.  Please place in mailbox as soon as possible.  Your physician should have your test results approximately 7 days after the monitor has been mailed back to [facility] [facility].   Call [place] at [phone] if you have questions regarding your ZIO XT patch monitor.  Call them immediately if you see an orange light blinking on your monitor.   If your monitor falls off in less than 4 days contact our Monitor department at [phone].  If your monitor becomes loose or falls off after 4 days call [employer] at [phone] for suggestions on securing your monitor.

## 2020-01-21 NOTE — Progress Notes (Signed)
Patient ID: Krystal Henderson, female   DOB: November 30, 1932, 86 y.o.   MRN: [id number] Patient enrolled for [employer] to ship a 14 day ZIO XT long term holter monitor to her home.

## 2020-01-21 NOTE — Progress Notes (Signed)
Electrophysiology Office Note   Date:  01/21/2020   ID:  Krystal Henderson, DOB 10/17/1932, MRN [id number]  PCP:  Carollee Herter, Alferd Apa, DO  Cardiologist:   Primary Electrophysiologist:  Jerlean Peralta Meredith Leeds, MD    Chief Complaint: Atrial fibrillation   History of Present Illness: Krystal Henderson is a 86 y.o. female who is being seen today for the evaluation of atrial fibrillation at the request of Ann Held, *. Presenting today for electrophysiology evaluation.  He has a history of hypertension and hyperlipidemia.  She was also found by her primary physician to have atrial fibrillation.  She is currently on Eliquis.  She is also had a PE in the past.  She presented to the hospital 11/08/2019 with shortness of breath.  She was found to have a pulmonary embolism and was started on Eliquis.  On 11/10/2019 she went into atrial fibrillation.  She was started on metoprolol.  Today, she denies symptoms of palpitations, chest pain, shortness of breath, orthopnea, PND, lower extremity edema, claudication, dizziness, presyncope, syncope, bleeding, or neurologic sequela. The patient is tolerating medications without difficulties.   Past Medical History:  Diagnosis Date  . Anemia    hx of   . Angiodysplasia 2007   @ colonoscopy  . Anxiety    PMH of  . Chronic low back pain 06/21/2014  . Diverticulosis of colon (without mention of hemorrhage)   . DJD (degenerative joint disease)   . Esophageal reflux    inactive  . History of vertebral fracture 11/2015  . HTN (hypertension)   . Hyperlipemia 2006   LDL 130  . Hypoglycemia    reactive  . Pelvic fracture ([place]) 12/30/2011   [facility]  . Peripheral neuropathy   . Personal history of colonic polyps    adenomatous  . Vitamin B12 deficiency    Past Surgical History:  Procedure Laterality Date  . cataract surgery  12-26-12 and 01-09-13  . COLONOSCOPY  2011   neg  . COLONOSCOPY W/ POLYPECTOMY     X 2 , Dr  Verl Blalock;  angiodysplasia. Due 2022  . DILATION AND CURETTAGE OF UTERUS    . FACIAL COSMETIC SURGERY    . HEMORROIDECTOMY    . LUMBAR LAMINECTOMY/DECOMPRESSION MICRODISCECTOMY  09/10/2011   Procedure: LUMBAR LAMINECTOMY/DECOMPRESSION MICRODISCECTOMY;  Surgeon: Johnn Hai, MD;  Location: [facility];  Service: Orthopedics;  Laterality: N/A;  decompression laminectomy L2-3, L3-4, L4-5  . ORIF WRIST FRACTURE  01/11/2012   Procedure: OPEN REDUCTION INTERNAL FIXATION (ORIF) WRIST FRACTURE;  Surgeon: Tennis Must, MD;  Location: [facility];  Service: Orthopedics;  Laterality: Right;  . TEAR DUCT PROBING     X 2  . TOTAL HIP ARTHROPLASTY  2000   right  . TUBAL LIGATION       Current Outpatient Medications  Medication Sig Dispense Refill  . amLODipine (NORVASC) 5 MG tablet Take 1 tablet (5 mg total) by mouth daily. 90 tablet 1  . apixaban (ELIQUIS) 5 MG TABS tablet Take 1 tablet (5 mg total) by mouth 2 (two) times daily. 60 tablet 3  . Ascorbic Acid (VITAMIN C) 1000 MG tablet Take 1,000 mg by mouth daily.     [name] aspirin 81 MG tablet Take 81 mg by mouth every evening.     . Calcium Carbonate (CALCIUM 600 PO) Take 1 capsule by mouth daily.     . Cholecalciferol (VITAMIN D) 2000 UNITS CAPS Take 1 capsule by mouth daily.     [name]  furosemide (LASIX) 20 MG tablet TAKE 1 TABLET(20 MG) BY MOUTH DAILY 90 tablet 1  . HYDROcodone-acetaminophen (NORCO/VICODIN) 5-325 MG tablet Take 1 tablet by mouth every 6 (six) hours as needed for moderate pain or severe pain.   0  . losartan (COZAAR) 100 MG tablet TAKE 1 TABLET BY MOUTH EVERY DAY 90 tablet 1  . LYRICA 100 MG capsule Take 1 capsule (100 mg total) by mouth at bedtime. (Patient taking differently: Take 100 mg by mouth at bedtime. Take along with 25 mg capsule=125 mg) 30 capsule 5  . LYRICA 25 MG capsule Take 1 capsule (25 mg total) by mouth at bedtime. Take along with 100 mg capsule=125 mg 30 capsule 5  . nortriptyline (PAMELOR) 10 MG capsule Take 1 capsule  (10 mg total) by mouth at bedtime. 60 capsule 3  . Polyethyl Glycol-Propyl Glycol (SYSTANE ULTRA OP) Apply 1 drop to eye daily as needed (for dry eyes).    . metoprolol succinate (TOPROL-XL) 25 MG 24 hr tablet Take 1 tablet (25 mg total) by mouth daily. Take with or immediately following a meal. 30 tablet 3   No current facility-administered medications for this visit.    Allergies:   Codeine, Duloxetine, and Sertraline hcl   Social History:  The patient  reports that she has never smoked. She has never used smokeless tobacco. She reports current alcohol use of about 3.0 standard drinks of alcohol per week. She reports that she does not use drugs.   Family History:  The patient's family history includes Cancer in her brother; Depression in her maternal uncle; Heart attack (age of onset: 2) in her brother; Heart attack (age of onset: 83) in her father; Kidney disease in her mother; Osteoporosis in her sister; Peripheral vascular disease in her daughter; Stroke (age of onset: 52) in her brother; Throat cancer in her sister.    ROS:  Please see the history of present illness.   Otherwise, review of systems is positive for none.   All other systems are reviewed and negative.    PHYSICAL EXAM: VS:  BP 132/72   Pulse 72   Ht 5\' 5"  (1.651 m)   Wt 178 lb 9.6 oz (81 kg)   SpO2 97%   BMI 29.72 kg/m  , BMI Body mass index is 29.72 kg/m. GEN: Well nourished, well developed, in no acute distress  HEENT: normal  Neck: no JVD, carotid bruits, or masses Cardiac: RRR; no murmurs, rubs, or gallops,no edema  Respiratory:  clear to auscultation bilaterally, normal work of breathing GI: soft, nontender, nondistended, + BS MS: no deformity or atrophy  Skin: warm and dry Neuro:  Strength and sensation are intact Psych: euthymic mood, full affect  EKG:  EKG is ordered today. Personal review of the ekg ordered shows sinus rhythm  Recent Labs: 11/08/2019: B Natriuretic Peptide 267.1 11/09/2019: TSH  2.432 11/12/2019: Hemoglobin 11.4; Magnesium 2.5; Platelets 325 01/03/2020: ALT 7; BUN 26; Creat 2.09; Potassium 4.7; Sodium 137    Lipid Panel     Component Value Date/Time   CHOL 196 01/03/2020 1605   TRIG 106 01/03/2020 1605   HDL 66 01/03/2020 1605   CHOLHDL 3.0 01/03/2020 1605   VLDL 10 11/09/2019 0150   LDLCALC 109 (H) 01/03/2020 1605   LDLDIRECT 129.0 10/18/2014 1156     Wt Readings from Last 3 Encounters:  01/21/20 178 lb 9.6 oz (81 kg)  01/03/20 171 lb 6.4 oz (77.7 kg)  11/12/19 178 lb 12.7 oz (81.1 kg)  Other studies Reviewed: Additional studies/ records that were reviewed today include: TTE 11/09/19  Review of the above records today demonstrates:  1. Mild global reduction in LV systolic function; grade 1 diastolic  dysfunction; right heart not well visualized.  2. Left ventricular ejection fraction, by estimation, is 45 to 50%. The  left ventricle has mildly decreased function. The left ventricle  demonstrates global hypokinesis. Left ventricular diastolic parameters are  consistent with Grade I diastolic  dysfunction (impaired relaxation). Elevated left atrial pressure.  3. Right ventricular systolic function is normal. The right ventricular  size is normal. Tricuspid regurgitation signal is inadequate for assessing  PA pressure.  4. Left atrial size was moderately dilated.  5. The mitral valve is normal in structure. Mild mitral valve  regurgitation. No evidence of mitral stenosis.  6. The aortic valve is tricuspid. Aortic valve regurgitation is mild. No  aortic stenosis is present.  7. Aortic dilatation noted. There is mild dilatation of the aortic root  measuring 39 mm.  8. The inferior vena cava is normal in size with greater than 50%  respiratory variability, suggesting right atrial pressure of 3 mmHg.    ASSESSMENT AND PLAN:  1.  Paroxysmal atrial fibrillation: Currently on Eliquis.  CHA2DS2-VASc of 4.  She does have some weakness and  fatigue, though it is unclear to me whether or not this is due to her atrial fibrillation.  I Enos Muhl have her wear a 2-week monitor to correlate with her symptoms.  2.  Hypertension: Currently well controlled  3.  Pulmonary embolism: Currently on Eliquis.  4.  Systolic heart failure: Found to be mild on her most recent echo.  She is already on losartan.  We Eyad Rochford start her on Toprol-XL.  She does not have any chest pain and thus we Bernadean Saling hold off on ischemic evaluation for now.  Current medicines are reviewed at length with the patient today.   The patient does not have concerns regarding her medicines.  The following changes were made today: Start Toprol-XL 25 mg  Labs/ tests ordered today include:  Orders Placed This Encounter  Procedures  . LONG TERM MONITOR (3-14 DAYS)  . EKG 12-Lead     Disposition:   FU with Chelsae Zanella 3 months  Signed, Daila Elbert Meredith Leeds, MD  01/21/2020 1:48 PM     [address] [place] [id number] [phone] (office) [phone] (fax)

## 2020-01-23 ENCOUNTER — Other Ambulatory Visit: Payer: Self-pay | Admitting: Family Medicine

## 2020-01-23 DIAGNOSIS — I10 Essential (primary) hypertension: Secondary | ICD-10-CM

## 2020-01-23 DIAGNOSIS — R600 Localized edema: Secondary | ICD-10-CM

## 2020-01-25 ENCOUNTER — Other Ambulatory Visit (INDEPENDENT_AMBULATORY_CARE_PROVIDER_SITE_OTHER): Payer: Medicare Other

## 2020-01-25 DIAGNOSIS — I480 Paroxysmal atrial fibrillation: Secondary | ICD-10-CM

## 2020-02-01 ENCOUNTER — Other Ambulatory Visit: Payer: Self-pay

## 2020-02-01 ENCOUNTER — Other Ambulatory Visit (HOSPITAL_BASED_OUTPATIENT_CLINIC_OR_DEPARTMENT_OTHER): Payer: Self-pay | Admitting: Internal Medicine

## 2020-02-01 ENCOUNTER — Ambulatory Visit: Payer: Medicare Other | Attending: Internal Medicine

## 2020-02-01 ENCOUNTER — Encounter: Payer: Self-pay | Admitting: Family Medicine

## 2020-02-01 ENCOUNTER — Ambulatory Visit (INDEPENDENT_AMBULATORY_CARE_PROVIDER_SITE_OTHER): Payer: Medicare Other | Admitting: Family Medicine

## 2020-02-01 DIAGNOSIS — Z23 Encounter for immunization: Secondary | ICD-10-CM

## 2020-02-01 DIAGNOSIS — I480 Paroxysmal atrial fibrillation: Secondary | ICD-10-CM | POA: Diagnosis not present

## 2020-02-01 DIAGNOSIS — I10 Essential (primary) hypertension: Secondary | ICD-10-CM | POA: Diagnosis not present

## 2020-02-01 DIAGNOSIS — I2699 Other pulmonary embolism without acute cor pulmonale: Secondary | ICD-10-CM | POA: Diagnosis not present

## 2020-02-01 NOTE — Progress Notes (Signed)
Patient ID: Krystal Henderson, female    DOB: 16-May-1932  Age: 86 y.o. MRN: [id number]    Subjective:  Subjective  HPI ILYANA Henderson presents for f/u cardiology for a fib --- pt is on elequis    Pt still has some fatigue but overall is feeling better.   No cp, no palpitations   Review of Systems  Constitutional: Positive for fatigue. Negative for appetite change, diaphoresis and unexpected weight change.  Eyes: Negative for pain, redness and visual disturbance.  Respiratory: Negative for cough, chest tightness, shortness of breath and wheezing.   Cardiovascular: Negative for chest pain, palpitations and leg swelling.  Endocrine: Negative for cold intolerance, heat intolerance, polydipsia, polyphagia and polyuria.  Genitourinary: Negative for difficulty urinating, dysuria and frequency.  Neurological: Negative for dizziness, light-headedness, numbness and headaches.    History Past Medical History:  Diagnosis Date  . Anemia    hx of   . Angiodysplasia 2007   @ colonoscopy  . Anxiety    PMH of  . Chronic low back pain 06/21/2014  . Diverticulosis of colon (without mention of hemorrhage)   . DJD (degenerative joint disease)   . Esophageal reflux    inactive  . History of vertebral fracture 11/2015  . HTN (hypertension)   . Hyperlipemia 2006   LDL 130  . Hypoglycemia    reactive  . Pelvic fracture ([place]) 12/30/2011   [facility]  . Peripheral neuropathy   . Personal history of colonic polyps    adenomatous  . Vitamin B12 deficiency     She has a past surgical history that includes Tear duct probing; Total hip arthroplasty (2000); Facial cosmetic surgery; Tubal ligation; Hemorroidectomy; Dilation and curettage of uterus; Colonoscopy w/ polypectomy; Lumbar laminectomy/decompression microdiscectomy (09/10/2011); ORIF wrist fracture (01/11/2012); cataract surgery (12-26-12 and 01-09-13); and Colonoscopy (2011).   Her family history includes Cancer in her brother; Depression in  her maternal uncle; Heart attack (age of onset: 21) in her brother; Heart attack (age of onset: 29) in her father; Kidney disease in her mother; Osteoporosis in her sister; Peripheral vascular disease in her daughter; Stroke (age of onset: 40) in her brother; Throat cancer in her sister.She reports that she has never smoked. She has never used smokeless tobacco. She reports current alcohol use of about 3.0 standard drinks of alcohol per week. She reports that she does not use drugs.  Current Outpatient Medications on File Prior to Visit  Medication Sig Dispense Refill  . amLODipine (NORVASC) 5 MG tablet Take 1 tablet (5 mg total) by mouth daily. 90 tablet 1  . apixaban (ELIQUIS) 5 MG TABS tablet Take 1 tablet (5 mg total) by mouth 2 (two) times daily. 60 tablet 3  . Ascorbic Acid (VITAMIN C) 1000 MG tablet Take 1,000 mg by mouth daily.     [name] aspirin 81 MG tablet Take 81 mg by mouth every evening.     . Calcium Carbonate (CALCIUM 600 PO) Take 1 capsule by mouth daily.     . Cholecalciferol (VITAMIN D) 2000 UNITS CAPS Take 1 capsule by mouth daily.     . furosemide (LASIX) 20 MG tablet Take 1 tablet (20 mg total) by mouth daily. 90 tablet 1  . HYDROcodone-acetaminophen (NORCO/VICODIN) 5-325 MG tablet Take 1 tablet by mouth every 6 (six) hours as needed for moderate pain or severe pain.   0  . losartan (COZAAR) 100 MG tablet TAKE 1 TABLET BY MOUTH EVERY DAY 90 tablet 1  . LYRICA 100  MG capsule Take 1 capsule (100 mg total) by mouth at bedtime. (Patient taking differently: Take 100 mg by mouth at bedtime. Take along with 25 mg capsule=125 mg) 30 capsule 5  . LYRICA 25 MG capsule Take 1 capsule (25 mg total) by mouth at bedtime. Take along with 100 mg capsule=125 mg 30 capsule 5  . metoprolol succinate (TOPROL-XL) 25 MG 24 hr tablet Take 1 tablet (25 mg total) by mouth daily. Take with or immediately following a meal. 30 tablet 3  . nortriptyline (PAMELOR) 10 MG capsule Take 1 capsule (10 mg total) by  mouth at bedtime. 60 capsule 3  . Polyethyl Glycol-Propyl Glycol (SYSTANE ULTRA OP) Apply 1 drop to eye daily as needed (for dry eyes).     No current facility-administered medications on file prior to visit.     Objective:  Objective  Physical Exam Vitals and nursing note reviewed.  Constitutional:      Appearance: She is well-developed.  HENT:     Head: Normocephalic and atraumatic.  Eyes:     Conjunctiva/sclera: Conjunctivae normal.  Neck:     Thyroid: No thyromegaly.     Vascular: No carotid bruit or JVD.  Cardiovascular:     Rate and Rhythm: Normal rate and regular rhythm.     Heart sounds: Normal heart sounds. No murmur heard.   Pulmonary:     Effort: Pulmonary effort is normal. No respiratory distress.     Breath sounds: Normal breath sounds. No wheezing or rales.  Chest:     Chest wall: No tenderness.  Musculoskeletal:     Cervical back: Normal range of motion and neck supple.  Neurological:     Mental Status: She is alert and oriented to person, place, and time.    BP 120/80 (BP Location: Right Arm, Patient Position: Sitting, Cuff Size: Normal)   Pulse 62   Temp 97.8 F (36.6 C) (Oral)   Resp 18   Ht 5\' 5"  (1.651 m)   Wt 171 lb 12.8 oz (77.9 kg)   SpO2 94%   BMI 28.59 kg/m  Wt Readings from Last 3 Encounters:  02/01/20 171 lb 12.8 oz (77.9 kg)  01/21/20 178 lb 9.6 oz (81 kg)  01/03/20 171 lb 6.4 oz (77.7 kg)     Lab Results  Component Value Date   WBC 6.8 11/12/2019   HGB 11.4 (L) 11/12/2019   HCT 35.1 (L) 11/12/2019   PLT 325 11/12/2019   GLUCOSE 96 01/03/2020   CHOL 196 01/03/2020   TRIG 106 01/03/2020   HDL 66 01/03/2020   LDLDIRECT 129.0 10/18/2014   LDLCALC 109 (H) 01/03/2020   ALT 7 01/03/2020   AST 12 01/03/2020   NA 137 01/03/2020   K 4.7 01/03/2020   CL 103 01/03/2020   CREATININE 2.09 (H) 01/03/2020   BUN 26 (H) 01/03/2020   CO2 16 (L) 01/03/2020   TSH 2.432 11/09/2019   INR 1.4 (H) 11/08/2019   HGBA1C 5.7 (H) 11/09/2019     DG Chest 2 View  Result Date: 11/08/2019 CLINICAL DATA:  Chest pain. EXAM: CHEST - 2 VIEW COMPARISON:  07/22/2015. FINDINGS: Cardiomegaly with mild pulmonary venous congestion. Right upper lobe infiltrate/edema. No pleural effusion or pneumothorax. Sliding hiatal hernia. No acute bony abnormality. IMPRESSION: 1.  Cardiomegaly with pulmonary venous congestion. 2. Right upper lobe infiltrate/edema. Right upper lobe pneumonia cannot be excluded. 3.  Sliding hiatal hernia. Electronically Signed   By: Marcello Moores  Register   On: 11/08/2019 13:44   NM  Pulmonary Perfusion  Result Date: 11/08/2019 CLINICAL DATA:  Shortness of breath and elevated D-dimer. EXAM: NUCLEAR MEDICINE PERFUSION LUNG SCAN TECHNIQUE: Perfusion images were obtained in multiple projections after intravenous injection of radiopharmaceutical. Ventilation scans intentionally deferred if perfusion scan and chest x-ray adequate for interpretation during COVID 19 epidemic. RADIOPHARMACEUTICALS:  4.2 mCi Tc-97m MAA IV COMPARISON:  Chest plain film, dated November 08, 2019, is available for comparison. FINDINGS: A small area of nonsegmental decreased perfusion is seen along the posterior aspect of the upper left lung. This is best seen on the posterior and left lateral views. No corresponding abnormalities are seen within this region on the recent chest plain film. Homogeneous distribution of tracer uptake is noted throughout the remainder of both lungs. IMPRESSION: 1. Small area of decreased perfusion seen along the posterior aspect of the left upper lobe. Sequelae associated with a small pulmonary embolism cannot be excluded. Further evaluation with chest CTA is recommended. Electronically Signed   By: Virgina Norfolk M.D.   On: 11/08/2019 19:55   ECHOCARDIOGRAM COMPLETE  Result Date: 11/09/2019    ECHOCARDIOGRAM REPORT   Patient Name:   AUBRIAUNA RINER Date of Exam: 11/09/2019 Medical Rec #:  [id number]         Height:       65.0 in Accession #:     [id number]        Weight:       185.0 lb Date of Birth:  [birth year]          BSA:          1.914 m Patient Age:    33 years          BP:           122/77 mmHg Patient Gender: F                 HR:           73 bpm. Exam Location:  Inpatient Procedure: 2D Echo, Color Doppler and Cardiac Doppler Indications:    I26.02 Pulmonary embolus  History:        Patient has no prior history of Echocardiogram examinations.                 Risk Factors:Hypertension and Dyslipidemia.  Sonographer:    Raquel Sarna Senior RDCS Referring Phys: [place]  Sonographer Comments: Technically difficult study due to poor echo windows and hiatal hernia. IMPRESSIONS  1. Mild global reduction in LV systolic function; grade 1 diastolic dysfunction; right heart not well visualized.  2. Left ventricular ejection fraction, by estimation, is 45 to 50%. The left ventricle has mildly decreased function. The left ventricle demonstrates global hypokinesis. Left ventricular diastolic parameters are consistent with Grade I diastolic dysfunction (impaired relaxation). Elevated left atrial pressure.  3. Right ventricular systolic function is normal. The right ventricular size is normal. Tricuspid regurgitation signal is inadequate for assessing PA pressure.  4. Left atrial size was moderately dilated.  5. The mitral valve is normal in structure. Mild mitral valve regurgitation. No evidence of mitral stenosis.  6. The aortic valve is tricuspid. Aortic valve regurgitation is mild. No aortic stenosis is present.  7. Aortic dilatation noted. There is mild dilatation of the aortic root measuring 39 mm.  8. The inferior vena cava is normal in size with greater than 50% respiratory variability, suggesting right atrial pressure of 3 mmHg. FINDINGS  Left Ventricle: Left ventricular ejection fraction, by estimation, is 45 to 50%. The left  ventricle has mildly decreased function. The left ventricle demonstrates global hypokinesis. The left ventricular  internal cavity size was normal in size. There is  no left ventricular hypertrophy. Left ventricular diastolic function could not be evaluated due to atrial fibrillation. Left ventricular diastolic parameters are consistent with Grade I diastolic dysfunction (impaired relaxation). Elevated left atrial pressure. Right Ventricle: The right ventricular size is normal.Right ventricular systolic function is normal. Tricuspid regurgitation signal is inadequate for assessing PA pressure. The tricuspid regurgitant velocity is 1.95 m/s, and with an assumed right atrial pressure of 3 mmHg, the estimated right ventricular systolic pressure is 79.8 mmHg. Left Atrium: Left atrial size was moderately dilated. Right Atrium: Right atrial size was normal in size. Pericardium: There is no evidence of pericardial effusion. Mitral Valve: The mitral valve is normal in structure. Normal mobility of the mitral valve leaflets. Mild mitral annular calcification. Mild mitral valve regurgitation. No evidence of mitral valve stenosis. Tricuspid Valve: The tricuspid valve is normal in structure. Tricuspid valve regurgitation is trivial. No evidence of tricuspid stenosis. Aortic Valve: The aortic valve is tricuspid. Aortic valve regurgitation is mild. No aortic stenosis is present. Pulmonic Valve: The pulmonic valve was normal in structure. Pulmonic valve regurgitation is trivial. No evidence of pulmonic stenosis. Aorta: Aortic dilatation noted. There is mild dilatation of the aortic root measuring 39 mm. Venous: The inferior vena cava is normal in size with greater than 50% respiratory variability, suggesting right atrial pressure of 3 mmHg.  Additional Comments: Mild global reduction in LV systolic function; grade 1 diastolic dysfunction; right heart not well visualized.  LEFT VENTRICLE PLAX 2D LVIDd:         4.97 cm     Diastology LVIDs:         2.90 cm     LV e' lateral:   8.38 cm/s LV PW:         0.82 cm     LV E/e' lateral: 9.0 LV IVS:         1.00 cm     LV e' medial:    5.00 cm/s LVOT diam:     2.10 cm     LV E/e' medial:  15.1 LV SV:         62 LV SV Index:   32 LVOT Area:     3.46 cm  LV Volumes (MOD) LV vol d, MOD A4C: 95.0 ml LV vol s, MOD A4C: 44.2 ml LV SV MOD A4C:     95.0 ml RIGHT VENTRICLE RV S prime:     12.00 cm/s TAPSE (M-mode): 2.1 cm LEFT ATRIUM             Index       RIGHT ATRIUM           Index LA diam:        4.30 cm 2.25 cm/m  RA Area:     16.30 cm LA Vol (A2C):   97.4 ml 50.90 ml/m RA Volume:   41.00 ml  21.42 ml/m LA Vol (A4C):   89.1 ml 46.56 ml/m LA Biplane Vol: 98.8 ml 51.63 ml/m  AORTIC VALVE LVOT Vmax:   76.10 cm/s LVOT Vmean:  57.300 cm/s LVOT VTI:    0.179 m  AORTA Ao Root diam: 3.40 cm Ao Asc diam:  3.85 cm MITRAL VALVE               TRICUSPID VALVE MV Area (PHT): 3.72 cm    TR Peak grad:   15.2  mmHg MV Decel Time: 204 msec    TR Vmax:        195.00 cm/s MV E velocity: 75.70 cm/s MV A velocity: 86.30 cm/s  SHUNTS MV E/A ratio:  0.88        Systemic VTI:  0.18 m                            Systemic Diam: 2.10 cm Kirk Ruths MD Electronically signed by Kirk Ruths MD Signature Date/Time: 11/09/2019/2:53:34 PM    Final    VAS [place] LOWER EXTREMITY VENOUS (DVT)  Result Date: 11/10/2019  Lower Venous DVTStudy Indications: Pulmonary embolism.  Limitations: Poor ultrasound/tissue interface. Comparison Study: No prior study Performing Technologist: Maudry Mayhew MHA, RDMS, RVT, RDCS  Examination Guidelines: A complete evaluation includes B-mode imaging, spectral Doppler, color Doppler, and power Doppler as needed of all accessible portions of each vessel. Bilateral testing is considered an integral part of a complete examination. Limited examinations for reoccurring indications may be performed as noted. The reflux portion of the exam is performed with the patient in reverse Trendelenburg.  +---------+---------------+---------+-----------+----------+--------------+ RIGHT     CompressibilityPhasicitySpontaneityPropertiesThrombus Aging +---------+---------------+---------+-----------+----------+--------------+ CFV      Full           Yes      Yes                                 +---------+---------------+---------+-----------+----------+--------------+ SFJ      Full                                                        +---------+---------------+---------+-----------+----------+--------------+ FV Prox  Full                                                        +---------+---------------+---------+-----------+----------+--------------+ FV Mid   Full                                                        +---------+---------------+---------+-----------+----------+--------------+ FV DistalFull                                                        +---------+---------------+---------+-----------+----------+--------------+ PFV      Full                                                        +---------+---------------+---------+-----------+----------+--------------+ POP      Full           Yes      Yes                                 +---------+---------------+---------+-----------+----------+--------------+  PTV      Full                                                        +---------+---------------+---------+-----------+----------+--------------+ PERO     Full                                                        +---------+---------------+---------+-----------+----------+--------------+   +---------+---------------+---------+-----------+----------+--------------+ LEFT     CompressibilityPhasicitySpontaneityPropertiesThrombus Aging +---------+---------------+---------+-----------+----------+--------------+ CFV      Full           Yes      Yes                                 +---------+---------------+---------+-----------+----------+--------------+ SFJ      Full                                                         +---------+---------------+---------+-----------+----------+--------------+ FV Prox  Full                                                        +---------+---------------+---------+-----------+----------+--------------+ FV Mid   Full                                                        +---------+---------------+---------+-----------+----------+--------------+ FV DistalFull                                                        +---------+---------------+---------+-----------+----------+--------------+ PFV      Full                                                        +---------+---------------+---------+-----------+----------+--------------+ POP      Full           Yes      Yes                                 +---------+---------------+---------+-----------+----------+--------------+ PTV      Full                                                        +---------+---------------+---------+-----------+----------+--------------+   PERO     Full                                                        +---------+---------------+---------+-----------+----------+--------------+     Summary: RIGHT: - There is no evidence of deep vein thrombosis in the lower extremity.  - No cystic structure found in the popliteal fossa.  LEFT: - There is no evidence of deep vein thrombosis in the lower extremity.  - No cystic structure found in the popliteal fossa.  *See table(s) above for measurements and observations. Electronically signed by Harold Barban MD on 11/10/2019 at 1:17:17 AM.    Final      Assessment & Plan:  Plan  I am having [place]. [employer]. Oxendine" maintain her aspirin, Calcium Carbonate (CALCIUM 600 PO), vitamin C, Vitamin D, Polyethyl Glycol-Propyl Glycol (SYSTANE ULTRA OP), HYDROcodone-acetaminophen, amLODipine, Lyrica, nortriptyline, losartan, apixaban, Lyrica, metoprolol succinate, and furosemide.  No orders of the defined types  were placed in this encounter.   Problem List Items Addressed This Visit      Unprioritized   Acute pulmonary embolism ([place])    On eliquis      AF (paroxysmal atrial fibrillation) ([place])    Per cards con't eliquis  Cont' metoprolol       Essential hypertension    Stable con't losartan, norvasc and metoprolol Well controlled, no changes to meds. Encouraged heart healthy diet such as the DASH diet and exercise as tolerated.          Follow-up: Return in about 6 months (around 08/01/2020), or if symptoms worsen or fail to improve, for hypertension.  Ann Held, DO

## 2020-02-01 NOTE — Patient Instructions (Signed)

## 2020-02-02 NOTE — Patient Instructions (Signed)
Visit Information  Goals Addressed            This Visit's Progress    Chronic Care Management Pharmacy Care Plan       CARE PLAN ENTRY (see longitudinal plan of care for additional care plan information)  Current Barriers:   Chronic Disease Management support, education, and care coordination needs related to Hypertension, Hyperlipidemia, Pre-DM, AFib, Heart Failure, Neuropathy, Anxiety, Back Pain, Osteoporosis   Hypertension BP Readings from Last 3 Encounters:  02/01/20 120/80  01/21/20 132/72  01/03/20 120/86    Pharmacist Clinical Goal(s): o Over the next 90days, patient will work with PharmD and providers to maintain BP goal <140/90  Current regimen:   Amlodipine 5mg  daily   Losartan 100mg  daily  Interventions: o Discussed BP goals  Patient self care activities - Over the next 90 days, patient will: o Maintain hypertension medication regimen.   Hyperlipidemia Lab Results  Component Value Date/Time   LDLCALC 109 (H) 01/03/2020 04:05 PM   LDLDIRECT 129.0 10/18/2014 11:56 AM    Pharmacist Clinical Goal(s): o Over the next 90 days, patient will work with PharmD and providers to maintain LDL goal < 100  Current regimen:  o Diet and exercise management    Interventions: o Discussed LDL goal  Patient self care activities - Over the next 90 days, patient will: o Maintain diet and exercise regiment  Diabetes Lab Results  Component Value Date/Time   HGBA1C 5.7 (H) 11/09/2019 01:50 AM   HGBA1C 5.6 08/24/2011 10:18 AM    Pharmacist Clinical Goal(s): o Over the next 90 days, patient will work with PharmD and providers to maintain A1c goal <6.5%  Current regimen:  o Diet and exercise management    Interventions: o Discussed a1c goal  Patient self care activities - Over the next 90 days, patient will: o Maintain a1c <6.5%  Heart Failure  Pharmacist Clinical Goal(s) o Over the next 90 days, patient will work with PharmD and providers to reduce  symptoms associated with heart failure  Current regimen:  o Furosemide 20mg  daily  Interventions: o Encouraged patient to weigh daily o Educated patient to notify office if she gains more than 3lbs in one Lacorey Brusca or 5lbs in one week  Patient self care activities - Over the next 90 days, patient will: o Weigh daily  o Notify office if she gains more than 3lbs in one Francile [place] or 5lbs in one week  Neuropathy  Pharmacist Clinical Goal(s) o Over the next 90 days, patient will work with PharmD and providers to reduce symptoms associated with neuropathy  Current regimen:   Lyrica 100mg  daily at bedtime  Lyrica 25mg  daily at bedtime  Nortriptyline 10mg  daily at   Interventions: o Discussed that patient was deficient in Vitamin B12 and this could contribute to her neuropathy o Encouraged patient to try Vitamin B12 1075mcg daily over the counter o Collaboration with provider regarding medication management (consider rechecking Vitamin B12)  Patient self care activities - Over the next 90 days, patient will: o Have B12 rechecked at next office visit o Consider taking Vitamin B12 1040mcg   Osteoporosis  Pharmacist Clinical Goal(s) o Over the next 90 days, patient will work with PharmD and providers to reduce risk of fracture associated with osteoporosis  Current regimen:   Calcium Carbonate 600mg  #2 daily   Vitamin D 2000 units once daily  Interventions: o Consider taking calcium twice daily vs #2 tabs daily to increase absorption o Consider repeat DEXA Scan  Patient self  care activities - Over the next 90 days, patient will: o Consider taking calcium twice daily vs #2 tabs daily to increase absorption o Consider repeat DEXA Scan  Health Maintenance   Pharmacist Clinical Goal(s) o Over the next 180 days, patient will work with PharmD and providers to complete health maintenance screenings/vaccinations  Interventions: o Discussed Shingrix Vaccine Series  Patient self care  activities - Over the next 180 days, patient will: o Complete Shingrix vaccine series  Medication management  Pharmacist Clinical Goal(s): o Over the next 90 days, patient will work with PharmD and providers to maintain optimal medication adherence  Current pharmacy: [facility]  Interventions o Comprehensive medication review performed. o Continue current medication management strategy  Patient self care activities - Over the next 90 days, patient will: o Focus on medication adherence by filling and taking medications appropriately  o Take medications as prescribed o Report any questions or concerns to PharmD and/or provider(s)  Please see past updates related to this goal by clicking on the "Past Updates" button in the selected goal         Patient verbalizes understanding of instructions provided today.   Telephone follow up appointment with pharmacy team member scheduled for: 02/21/2020  Melvenia Beam Dail Lerew, PharmD Clinical Pharmacist [facility] [phone]

## 2020-02-03 NOTE — Assessment & Plan Note (Signed)
Stable con't losartan, norvasc and metoprolol Well controlled, no changes to meds. Encouraged heart healthy diet such as the DASH diet and exercise as tolerated.

## 2020-02-03 NOTE — Assessment & Plan Note (Addendum)
On eliquis

## 2020-02-03 NOTE — Assessment & Plan Note (Addendum)
Per cards con't eliquis  Cont' metoprolol

## 2020-02-04 NOTE — Progress Notes (Signed)
   Covid-19 Vaccination Clinic  Name:  KURT AZIMI    MRN: [id number] DOB: December 09, 1932  02/04/2020  Ms. Barlowe was observed post Covid-19 immunization for 15 minutes without incident. She was provided with Vaccine Information Sheet and instruction to access the V-Safe system.   Ms. Christner was instructed to call 911 with any severe reactions post vaccine: [name] Difficulty breathing  . Swelling of face and throat  . A fast heartbeat  . A bad rash all over body  . Dizziness and weakness

## 2020-02-08 MED FILL — PFIZER-BIONTECH COVID-19 VA: 30 | 1 days supply | Qty: 0 | Fill #0

## 2020-02-15 DIAGNOSIS — I480 Paroxysmal atrial fibrillation: Secondary | ICD-10-CM | POA: Diagnosis not present

## 2020-02-20 ENCOUNTER — Ambulatory Visit: Payer: Medicare Other | Admitting: Neurology

## 2020-02-21 ENCOUNTER — Ambulatory Visit: Payer: Medicare Other | Admitting: Pharmacist

## 2020-02-21 DIAGNOSIS — G609 Hereditary and idiopathic neuropathy, unspecified: Secondary | ICD-10-CM

## 2020-02-21 DIAGNOSIS — I10 Essential (primary) hypertension: Secondary | ICD-10-CM

## 2020-02-21 DIAGNOSIS — I5043 Acute on chronic combined systolic (congestive) and diastolic (congestive) heart failure: Secondary | ICD-10-CM

## 2020-02-21 DIAGNOSIS — I480 Paroxysmal atrial fibrillation: Secondary | ICD-10-CM

## 2020-02-21 NOTE — Chronic Care Management (AMB) (Signed)
Chronic Care Management Pharmacy  Name: Krystal Henderson  MRN: [id number] DOB: [birth year]  Chief Complaint/ HPI  Krystal Henderson,  86 y.o. , female presents for their Follow-Up CCM visit with the clinical pharmacist via telephone due to COVID-19 Pandemic.  PCP : Krystal Held, DO  Their chronic conditions include: Hypertension, Hyperlipidemia, Pre-DM, AFib, Heart Failure, Neuropathy, Anxiety, Back Pain, Osteoporosis  Office Visits: 02/01/20: Visit w/ Dr. Etter Sjogren - No med changes noted. RTC 6 months.   Consult Visit: 01/21/20: Cardio visit w/ Dr. Curt Bears - Start metoprolol succinate 25mg  daily. Monitor placed for Afib: "5% AF burden. No symptoms recorded with AF. If symptoms of fatigue continue could try rhythm control."  Medications: Outpatient Encounter Medications as of 02/21/2020  Medication Sig   amLODipine (NORVASC) 5 MG tablet Take 1 tablet (5 mg total) by mouth daily.   apixaban (ELIQUIS) 5 MG TABS tablet Take 1 tablet (5 mg total) by mouth 2 (two) times daily.   Ascorbic Acid (VITAMIN C) 1000 MG tablet Take 1,000 mg by mouth daily.    aspirin 81 MG tablet Take 81 mg by mouth every evening.    Calcium Carbonate (CALCIUM 600 PO) Take 1 capsule by mouth daily.    Cholecalciferol (VITAMIN D) 2000 UNITS CAPS Take 1 capsule by mouth daily.    furosemide (LASIX) 20 MG tablet Take 1 tablet (20 mg total) by mouth daily.   HYDROcodone-acetaminophen (NORCO/VICODIN) 5-325 MG tablet Take 1 tablet by mouth every 6 (six) hours as needed for moderate pain or severe pain.    losartan (COZAAR) 100 MG tablet TAKE 1 TABLET BY MOUTH EVERY Azia Toutant   LYRICA 100 MG capsule Take 1 capsule (100 mg total) by mouth at bedtime. (Patient taking differently: Take 100 mg by mouth at bedtime. Take along with 25 mg capsule=125 mg)   LYRICA 25 MG capsule Take 1 capsule (25 mg total) by mouth at bedtime. Take along with 100 mg capsule=125 mg   metoprolol succinate (TOPROL-XL) 25 MG 24 hr  tablet Take 1 tablet (25 mg total) by mouth daily. Take with or immediately following a meal.   nortriptyline (PAMELOR) 10 MG capsule Take 1 capsule (10 mg total) by mouth at bedtime.   Polyethyl Glycol-Propyl Glycol (SYSTANE ULTRA OP) Apply 1 drop to eye daily as needed (for dry eyes).   No facility-administered encounter medications on file as of 02/21/2020.   SDOH Screenings   Alcohol Screen:    Last Alcohol Screening Score (AUDIT): Not on file  Depression (PHQ2-9): Low Risk    PHQ-2 Score: 0  Financial Resource Strain: Medium Risk   Difficulty of Paying Living Expenses: Somewhat hard  Food Insecurity:    Worried About Charity fundraiser in the Last Year: Not on file   [employer] of Food in the Last Year: Not on file  Housing:    Last Housing Risk Score: Not on file  Physical Activity:    Days of Exercise per Week: Not on file   Minutes of Exercise per Session: Not on file  Social Connections:    Frequency of Communication with Friends and Family: Not on file   Frequency of Social Gatherings with Friends and Family: Not on file   Attends Religious Services: Not on file   Active Member of Clubs or Organizations: Not on file   Attends Archivist Meetings: Not on file   Marital Status: Not on file  Stress:    Feeling of Stress : Not  on file  Tobacco Use: Low Risk    Smoking Tobacco Use: Never Smoker   Smokeless Tobacco Use: Never Used  Transportation Needs:    Film/video editor (Medical): Not on file   Lack of Transportation (Non-Medical): Not on file     Current Diagnosis/Assessment:  Goals Addressed            This Visit's Progress    Chronic Care Management Pharmacy Care Plan       CARE PLAN ENTRY (see longitudinal plan of care for additional care plan information)  Current Barriers:   Chronic Disease Management support, education, and care coordination needs related to Hypertension, Hyperlipidemia, Pre-DM, AFib, Heart  Failure, Neuropathy, Anxiety, Back Pain, Osteoporosis   Hypertension BP Readings from Last 3 Encounters:  02/01/20 120/80  01/21/20 132/72  01/03/20 120/86    Pharmacist Clinical Goal(s): o Over the next 90days, patient will work with PharmD and providers to maintain BP goal <140/90  Current regimen:   Amlodipine 5mg  daily   Losartan 100mg  daily  Metoprolol Succinate 25mg  daily  Interventions: o Discussed BP goals o Recommended patient purchase arm blood pressure cuff and record BP 2-3 times per week and record  Patient self care activities - Over the next 90 days, patient will: o Maintain hypertension medication regimen.  o Purchase arm blood pressure cuff and record BP 2-3 times per week and record  Hyperlipidemia Lab Results  Component Value Date/Time   LDLCALC 109 (H) 01/03/2020 04:05 PM   LDLDIRECT 129.0 10/18/2014 11:56 AM    Pharmacist Clinical Goal(s): o Over the next 90 days, patient will work with PharmD and providers to maintain LDL goal < 100  Current regimen:  o Diet and exercise management    Interventions: o Discussed LDL goal  Patient self care activities - Over the next 90 days, patient will: o Maintain diet and exercise regiment  Pre-Diabetes Lab Results  Component Value Date/Time   HGBA1C 5.7 (H) 11/09/2019 01:50 AM   HGBA1C 5.6 08/24/2011 10:18 AM    Pharmacist Clinical Goal(s): o Over the next 90 days, patient will work with PharmD and providers to maintain A1c goal <6.5%  Current regimen:  o Diet and exercise management    Interventions: o Discussed a1c goal  Patient self care activities - Over the next 90 days, patient will: o Maintain a1c <6.5%  AFib  Pharmacist Clinical Goal(s) o Over the next 90 days, patient will work with PharmD and providers to reduce risk of stroke and symptoms related to AFib  Current regimen:   Eliquis 5mg  twice daily  Metoprolol Succinate 25mg  daily  Interventions: o Discussed dosing of  Eliquis o Discussed possibility of Patient Assistance Program for Eliquis  Patient self care activities - Over the next 90 days, patient will: o Maintain Afib medication regimen  Heart Failure  Pharmacist Clinical Goal(s) o Over the next 90 days, patient will work with PharmD and providers to reduce symptoms associated with heart failure  Current regimen:  o Furosemide 20mg  daily o Metoprolol Succinate 25mg  daily  Interventions: o Encouraged patient to weigh daily o Educated patient to notify office if she gains more than 3lbs in one Maitland Muhlbauer or 5lbs in one week  Patient self care activities - Over the next 90 days, patient will: o Weigh daily  o Notify office if she gains more than 3lbs in one Claryssa Sandner or 5lbs in one week  Neuropathy  Pharmacist Clinical Goal(s) o Over the next 90 days, patient will work with  PharmD and providers to reduce symptoms associated with neuropathy  Current regimen:   Lyrica 100mg  daily at bedtime  Lyrica 25mg  daily at bedtime  Nortriptyline 10mg  daily at   Interventions: o Discussed that patient was deficient in Vitamin B12 and this could contribute to her neuropathy o Encouraged patient to try Vitamin B12 103mcg daily over the counter o Collaboration with provider regarding medication management (consider rechecking Vitamin B12)  Patient self care activities - Over the next 90 days, patient will: o Have B12 rechecked at next office visit o Consider taking Vitamin B12 1029mcg   Osteoporosis  Pharmacist Clinical Goal(s) o Over the next 90 days, patient will work with PharmD and providers to reduce risk of fracture associated with osteoporosis  Current regimen:   Calcium Carbonate 600mg  #2 daily   Vitamin D 2000 units once daily  Interventions: o Consider taking calcium twice daily vs #2 tabs daily to increase absorption o Consider repeat DEXA Scan  Patient self care activities - Over the next 90 days, patient will: o Consider taking  calcium twice daily vs #2 tabs daily to increase absorption o Consider repeat DEXA Scan  Health Maintenance   Pharmacist Clinical Goal(s) o Over the next 180 days, patient will work with PharmD and providers to complete health maintenance screenings/vaccinations  Interventions: o Discussed Shingrix Vaccine Series  Patient self care activities - Over the next 180 days, patient will: o Complete Shingrix vaccine series  Medication management  Pharmacist Clinical Goal(s): o Over the next 90 days, patient will work with PharmD and providers to maintain optimal medication adherence  Current pharmacy: [facility]  Interventions o Comprehensive medication review performed. o Continue current medication management strategy  Patient self care activities - Over the next 90 days, patient will: o Focus on medication adherence by filling and taking medications appropriately  o Take medications as prescribed o Report any questions or concerns to PharmD and/or provider(s)  Please see past updates related to this goal by clicking on the "Past Updates" button in the selected goal       Social Hx:  Born and raised in [place]. Married 57 years.  Had one daughter who passed away. Also had a grandson and he passed away. Does have a great grandson (90 yrs old; lives in [place]). No pets.  Hypertension   BP goal is:  <140/90  Office blood pressures are  BP Readings from Last 3 Encounters:  02/01/20 120/80  01/21/20 132/72  01/03/20 120/86   Patient checks BP at home infrequently (only has wrist cuff) Patient home BP readings are ranging: 130/60 this morning. States it has been normal when PT comes in and checks it.  Patient has failed these meds in the past: quinapril (listed in D/C meds. No apparent reason for D/C) Patient is currently controlled on the following medications:   Amlodipine 5mg  daily   Losartan 100mg  daily  Metoprolol Succinate 25mg  daily (started at 01/21/20 cardio  visit)  Fill Dates Per Dispense Report - Filled Appropriately Amlodipine 10/28/19 - 90 DS 07/28/19 - 90 DS  Losartan 11/01/19 - 90 DS 07/28/19 - 90 DS  We discussed BP goals   Update 01/10/20 Had a therapist coming to her home twice a week and was told her BP was always good.  Update 02/21/20 Started metoprolol.  Hasn't been checking BP. States she isn't aware of the results of her monitor. Reviewed results notated in chart.  Denies chest pain, headaches, and dizziness. Recommended patient to get arm BP cuff  to assure no s/sx of hypotension  Plan -Continue current medications  -Purchase arm blood pressure cuff and check BP 2-3 times per week  AFIB   CMP Latest Ref Rng & Units 01/03/2020 11/12/2019 11/11/2019  Glucose 65 - 99 mg/dL 96 96 95  BUN 7 - 25 mg/dL 26(H) 38(H) 37(H)  Creatinine 0.60 - 0.88 mg/dL 2.09(H) 1.79(H) 1.87(H)  Sodium 135 - 146 mmol/L 137 131(L) 132(L)  Potassium 3.5 - 5.3 mmol/L 4.7 5.0 4.9  Chloride 98 - 110 mmol/L 103 99 101  CO2 20 - 32 mmol/L 16(L) 22 22  Calcium 8.6 - 10.4 mg/dL 9.6 8.6(L) 8.7(L)  Total Protein 6.1 - 8.1 g/dL 6.9 - -  Total Bilirubin 0.2 - 1.2 mg/dL 0.5 - -  Alkaline Phos 39 - 117 U/L - - -  AST 10 - 35 U/L 12 - -  ALT 6 - 29 U/L 7 - -   Patient is currently neither controlled.  Patient has failed these meds in past: None noted  Patient is currently controlled on the following medications:  Eliquis 5mg  twice daily  Metoprolol Succinate 25mg  daily (started at 01/21/20 cardio visit)  Cost $133/month  For indication of PE treatment patient is on appropriate dose (No dose adjustment recommended; 5mg  BID).  For indication of Afib, recommended dose would be 2.5mg  BID noting patient age >21, and Scr >1.5   We discussed:  Dosing of Eliquis based on indications   Update 01/10/20 Scheduled follow up with cardiology? Yes on 01/21/20  Update 02/21/20 She doesn't feel she would qualify for PAP due to income.   Plan -Continue current  medications    Heart Failure   Type: Combined Systolic and Diastolic  Last ejection fraction: 11/09/19 45-50% NYHA Class: I (no actitivty limitation) AHA HF Stage: B (Heart disease present - no symptoms present)  Patient has failed these meds in past: None noted  Patient is currently controlled on the following medications:  Furosemide 20mg  daily  Reports she has bilateral edema at baseline. Legs are worse when she has her legs hanging down.  Denies SOB. Does not weigh daily. Acknowledges she was instructed to weigh daily. Reinforced daily weights  We discussed weighing daily; if you gain more than 3 pounds in one Zollie Ellery or 5 pounds in one week call your doctor   Update 01/10/20 Weighing daily? No, weighed when therapy came into the home. Feels her weight was stable  Update 02/21/20 Swelling is still at baseline Denies SOB Weighed 177.5   Plan -Continue current medications  -Weigh self daily  Neuropathy   Followed by Neuropathy Butler Denmark, PA-C)  Vitamin B12 - 01/03/20 - 429  Patient has failed these meds in past: pregabalin (ineffective), gabapentin (ineffective), duloxetine (listed in allergies as did not feel well) Patient is currently uncontrolled on the following medications:  Lyrica 100mg  daily at bedtime  Lyrica 25mg  daily at bedtime  Nortriptyline 10mg  daily at bedtime  Is getting home PT. She feels it is helping. States she takes 25mg  Lyrica in the morning and 100mg  at bedtime Has taken lyrica for years. Nortriptyline is more recent. States Lyrica is expensive $68 25mg  $100100mg   We discussed:  how she is deficient and B12 and this could be a contributing factor to her neuropathy. Recommended patient to start B12 1067mcg daily and get B12 rechecked at visit w/ Dr. Etter Sjogren on 01/03/20   Update 01/10/20 Started B12 after last visit. Feet are not feeling any better. Going to neurologist on 02/20/20. She  gets up 2-3 times per night to urinate. Could  consider titrating nortriptyline to 25mg  daily, but this might be limited due to her feelings of drowsiness throughout the [place]ie [place]in. She would actually like to taper off of nortryptyline.  We discussed use of alpha lipoic acid. Alpha lipoic acid reported to be possibly effective for neuropathy at doses of 600-1800mg  daily in one to 3 divided doses. Effects not seen for 3-5 weeks.   Considerations for a Plan -Stop nortriptyline, start duloxetine (pt open to this option possibly) AND/OR -Start alpha lipoic acid 600mg  daily for at least 5 weeks (no drug interactions noted)  Update 02/21/20 Saw neurologist? No, cancelled appt until 11/23 She tried alpha lipoic acid, and she felt it did seem to help, but got scared, so she plans to wait until neurology appt  Plan -Continue current medications    Vaccines   Reviewed and discussed patient's vaccination history.    Immunization History  Administered Date(s) Administered   Fluad Quad(high Dose 65+) 12/29/2018, 01/03/2020   Influenza Split 01/01/2012   Influenza Whole 01/03/2008, 12/30/2009   Influenza, High Dose Seasonal PF 01/04/2013, 01/08/2014, 12/22/2017   Influenza-Unspecified 12/19/2014, 12/25/2015, 01/11/2017   PFIZER SARS-COV-2 Vaccination 05/14/2019, 06/08/2019, 02/01/2020   Pneumococcal Conjugate-13 02/11/2014   Pneumococcal Polysaccharide-23 12/30/2009, 01/01/2012   Tdap 03/15/2011   Zoster 04/30/2013   Update 01/10/20 Patient received flu vaccine at 01/03/20 visit w/ Dr. Etter Sjogren  Plan -Recommended patient receive Shingrix vaccine in pharmacy.   Medication Management   Pt uses [facility] for all medications Uses pill box? Yes has an AM and PM box Pt endorses 100% compliance  We discussed: Discussed benefits of medication synchronization, packaging and delivery as well as enhanced pharmacist oversight with [employer]. Patient would like to think about it.  Miscellaneous Meds Vitamin C 1000mg  Systane eye  drop  Meds to D/C from list Miralax (uses prunes and dates cooked together; she takes 2 tablespoons per Olympia Adelsberger) Fluticasone 71mcg/nasal spray (not effective) Valtrex 1000mg  Aspirin 81mg  (should be D/C per message from 11/16/19)   Plan  Continue current medication management strategy   Follow up:  1 month phone visit  Melvenia Beam Alaine Loughney, PharmD Clinical Pharmacist [facility] [phone]

## 2020-02-21 NOTE — Patient Instructions (Addendum)
Visit Information  Goals Addressed            This Visit's Progress   . Chronic Care Management Pharmacy Care Plan       CARE PLAN ENTRY (see longitudinal plan of care for additional care plan information)  Current Barriers:  . Chronic Disease Management support, education, and care coordination needs related to Hypertension, Hyperlipidemia, Pre-DM, AFib, Heart Failure, Neuropathy, Anxiety, Back Pain, Osteoporosis   Hypertension BP Readings from Last 3 Encounters:  02/01/20 120/80  01/21/20 132/72  01/03/20 120/86   . Pharmacist Clinical Goal(s): o Over the next 90days, patient will work with PharmD and providers to maintain BP goal <140/90 . Current regimen:  . Amlodipine 5mg  daily  . Losartan 100mg  daily . Metoprolol Succinate 25mg  daily . Interventions: o Discussed BP goals o Recommended patient purchase arm blood pressure cuff and record BP 2-3 times per week and record . Patient self care activities - Over the next 90 days, patient will: o Maintain hypertension medication regimen.  o Purchase arm blood pressure cuff and record BP 2-3 times per week and record  Hyperlipidemia Lab Results  Component Value Date/Time   LDLCALC 109 (H) 01/03/2020 04:05 PM   LDLDIRECT 129.0 10/18/2014 11:56 AM   . Pharmacist Clinical Goal(s): o Over the next 90 days, patient will work with PharmD and providers to maintain LDL goal < 100 . Current regimen:  o Diet and exercise management   . Interventions: o Discussed LDL goal . Patient self care activities - Over the next 90 days, patient will: o Maintain diet and exercise regiment  Pre-Diabetes Lab Results  Component Value Date/Time   HGBA1C 5.7 (H) 11/09/2019 01:50 AM   HGBA1C 5.6 08/24/2011 10:18 AM   . Pharmacist Clinical Goal(s): o Over the next 90 days, patient will work with PharmD and providers to maintain A1c goal <6.5% . Current regimen:  o Diet and exercise management   . Interventions: o Discussed a1c  goal . Patient self care activities - Over the next 90 days, patient will: o Maintain a1c <6.5%  AFib . Pharmacist Clinical Goal(s) o Over the next 90 days, patient will work with PharmD and providers to reduce risk of stroke and symptoms related to AFib . Current regimen:   Eliquis 5mg  twice daily  Metoprolol Succinate 25mg  daily . Interventions: o Discussed dosing of Eliquis o Discussed possibility of Patient Assistance Program for Eliquis . Patient self care activities - Over the next 90 days, patient will: o Maintain Afib medication regimen  Heart Failure . Pharmacist Clinical Goal(s) o Over the next 90 days, patient will work with PharmD and providers to reduce symptoms associated with heart failure . Current regimen:  o Furosemide 20mg  daily o Metoprolol Succinate 25mg  daily . Interventions: o Encouraged patient to weigh daily o Educated patient to notify office if she gains more than 3lbs in one Lariah Fleer or 5lbs in one week . Patient self care activities - Over the next 90 days, patient will: o Weigh daily  o Notify office if she gains more than 3lbs in one Lyonel Morejon or 5lbs in one week  Neuropathy . Pharmacist Clinical Goal(s) o Over the next 90 days, patient will work with PharmD and providers to reduce symptoms associated with neuropathy . Current regimen:  . Lyrica 100mg  daily at bedtime . Lyrica 25mg  daily at bedtime . Nortriptyline 10mg  daily at  . Interventions: o Discussed that patient was deficient in Vitamin B12 and this could contribute to her neuropathy  o Encouraged patient to try Vitamin B12 1058mcg daily over the counter o Collaboration with provider regarding medication management (consider rechecking Vitamin B12) . Patient self care activities - Over the next 90 days, patient will: o Have B12 rechecked at next office visit o Consider taking Vitamin B12 1032mcg   Osteoporosis . Pharmacist Clinical Goal(s) o Over the next 90 days, patient will work with  PharmD and providers to reduce risk of fracture associated with osteoporosis . Current regimen:  . Calcium Carbonate 600mg  #2 daily  . Vitamin D 2000 units once daily . Interventions: o Consider taking calcium twice daily vs #2 tabs daily to increase absorption o Consider repeat DEXA Scan . Patient self care activities - Over the next 90 days, patient will: o Consider taking calcium twice daily vs #2 tabs daily to increase absorption o Consider repeat DEXA Scan  Health Maintenance  . Pharmacist Clinical Goal(s) o Over the next 180 days, patient will work with PharmD and providers to complete health maintenance screenings/vaccinations . Interventions: o Discussed Shingrix Vaccine Series . Patient self care activities - Over the next 180 days, patient will: o Complete Shingrix vaccine series  Medication management . Pharmacist Clinical Goal(s): o Over the next 90 days, patient will work with PharmD and providers to maintain optimal medication adherence . Current pharmacy: [facility] . Interventions o Comprehensive medication review performed. o Continue current medication management strategy . Patient self care activities - Over the next 90 days, patient will: o Focus on medication adherence by filling and taking medications appropriately  o Take medications as prescribed o Report any questions or concerns to PharmD and/or provider(s)  Please see past updates related to this goal by clicking on the "Past Updates" button in the selected goal         The patient verbalized understanding of instructions, educational materials, and care plan provided today and agreed to receive a mailed copy of patient instructions, educational materials, and care plan.   Telephone follow up appointment with pharmacy team member scheduled for: 03/31/2020  Melvenia Beam Kalon Erhardt, PharmD Clinical Pharmacist [facility] [phone]   How to Take Your Blood Pressure You can  take your blood pressure at home with a machine. You may need to check your blood pressure at home:  To check if you have high blood pressure (hypertension).  To check your blood pressure over time.  To make sure your blood pressure medicine is working. Supplies needed: You will need a blood pressure machine, or monitor. You can buy one at a drugstore or online. When choosing one:  Choose one with an arm cuff.  Choose one that wraps around your upper arm. Only one finger should fit between your arm and the cuff.  Do not choose one that measures your blood pressure from your wrist or finger. Your doctor can suggest a monitor. How to prepare Avoid these things for 30 minutes before checking your blood pressure:  Drinking caffeine.  Drinking alcohol.  Eating.  Smoking.  Exercising. Five minutes before checking your blood pressure:  Pee.  Sit in a dining chair. Avoid sitting in a soft couch or armchair.  Be quiet. Do not talk. How to take your blood pressure Follow the instructions that came with your machine. If you have a digital blood pressure monitor, these may be the instructions: 1. Sit up straight. 2. Place your feet on the floor. Do not cross your ankles or legs. 3. Rest your left arm at the  level of your heart. You may rest it on a table, desk, or chair. 4. Pull up your shirt sleeve. 5. Wrap the blood pressure cuff around the upper part of your left arm. The cuff should be 1 inch (2.5 cm) above your elbow. It is best to wrap the cuff around bare skin. 6. Fit the cuff snugly around your arm. You should be able to place only one finger between the cuff and your arm. 7. Put the cord inside the groove of your elbow. 8. Press the power button. 9. Sit quietly while the cuff fills with air and loses air. 10. Write down the numbers on the screen. 11. Wait 2-3 minutes and then repeat steps 1-10. What do the numbers mean? Two numbers make up your blood pressure. The first  number is called systolic pressure. The second is called diastolic pressure. An example of a blood pressure reading is "120 over 80" (or 120/80). If you are an adult and do not have a medical condition, use this guide to find out if your blood pressure is normal: Normal  First number: below 120.  Second number: below 80. Elevated  First number: 120-129.  Second number: below 80. Hypertension stage 1  First number: 130-139.  Second number: 80-89. Hypertension stage 2  First number: 140 or above.  Second number: 70 or above. Your blood pressure is above normal even if only the top or bottom number is above normal. Follow these instructions at home:  Check your blood pressure as often as your doctor tells you to.  Take your monitor to your next doctor's appointment. Your doctor will: ? Make sure you are using it correctly. ? Make sure it is working right.  Make sure you understand what your blood pressure numbers should be.  Tell your doctor if your medicines are causing side effects. Contact a doctor if:  Your blood pressure keeps being high. Get help right away if:  Your first blood pressure number is higher than 180.  Your second blood pressure number is higher than 120. This information is not intended to replace advice given to you by your health care provider. Make sure you discuss any questions you have with your health care provider. Document Revised: 03/04/2017 Document Reviewed: 08/29/2015 Elsevier Patient Education  2020 [employer].

## 2020-02-26 ENCOUNTER — Ambulatory Visit: Payer: Medicare Other | Admitting: Neurology

## 2020-02-26 ENCOUNTER — Encounter: Payer: Self-pay | Admitting: Neurology

## 2020-02-26 ENCOUNTER — Telehealth: Payer: Self-pay | Admitting: Neurology

## 2020-02-26 VITALS — BP 114/51 | HR 74 | Ht 65.0 in | Wt 182.0 lb

## 2020-02-26 DIAGNOSIS — G609 Hereditary and idiopathic neuropathy, unspecified: Secondary | ICD-10-CM

## 2020-02-26 DIAGNOSIS — M545 Low back pain, unspecified: Secondary | ICD-10-CM | POA: Diagnosis not present

## 2020-02-26 DIAGNOSIS — G8929 Other chronic pain: Secondary | ICD-10-CM | POA: Diagnosis not present

## 2020-02-26 MED ORDER — LYRICA 25 MG PO CAPS
25.0000 mg | ORAL_CAPSULE | Freq: Every day | ORAL | 5 refills | Status: DC
Start: 2020-02-26 — End: 2020-02-27

## 2020-02-26 MED ORDER — LYRICA 100 MG PO CAPS
100.0000 mg | ORAL_CAPSULE | Freq: Every day | ORAL | 5 refills | Status: DC
Start: 2020-02-26 — End: 2020-02-27

## 2020-02-26 MED ORDER — NORTRIPTYLINE HCL 10 MG PO CAPS
10.0000 mg | ORAL_CAPSULE | Freq: Every day | ORAL | 3 refills | Status: DC
Start: 2020-02-26 — End: 2020-12-22

## 2020-02-26 NOTE — Telephone Encounter (Signed)
Pt called wanting to know if her LYRICA 25 MG capsule and her LYRICA 100 MG capsule can be switched to a 90 day quantity  Please advise.

## 2020-02-26 NOTE — Progress Notes (Signed)
Reason for visit: Peripheral neuropathy, low back pain  Krystal Henderson is an 86 y.o. female  History of present illness:  Krystal Henderson is an 86 year old right-handed white female with a history of a peripheral neuropathy.  The patient has cold sensations in the feet when she wakes up in the morning.  She oftentimes wake up around 4 AM, she goes to bed at 10 PM.  She may be somewhat sleepy during the day, occasionally she will take a nap.  She continues to have some low back pain that is worsening over time, she has some Vicodin to take if needed.  The patient denies any falls, she uses a walker for ambulation.  She had a small pulmonary embolism on 08 November 2019.  She has been on anticoagulant therapy since that time.  She takes nortriptyline at night and Lyrica.  She tried to come off of the nortriptyline but this worsened her discomfort.  The patient is planning on going on alpha lipoic acid.  Past Medical History:  Diagnosis Date  . Anemia    hx of   . Angiodysplasia 2007   @ colonoscopy  . Anxiety    PMH of  . Chronic low back pain 06/21/2014  . Diverticulosis of colon (without mention of hemorrhage)   . DJD (degenerative joint disease)   . Esophageal reflux    inactive  . History of vertebral fracture 11/2015  . HTN (hypertension)   . Hyperlipemia 2006   LDL 130  . Hypoglycemia    reactive  . Pelvic fracture ([place]) 12/30/2011   [facility]  . Peripheral neuropathy   . Personal history of colonic polyps    adenomatous  . Vitamin B12 deficiency     Past Surgical History:  Procedure Laterality Date  . cataract surgery  12-26-12 and 01-09-13  . COLONOSCOPY  2011   neg  . COLONOSCOPY W/ POLYPECTOMY     X 2 , Dr  Verl Blalock; angiodysplasia. Due 2022  . DILATION AND CURETTAGE OF UTERUS    . FACIAL COSMETIC SURGERY    . HEMORROIDECTOMY    . LUMBAR LAMINECTOMY/DECOMPRESSION MICRODISCECTOMY  09/10/2011   Procedure: LUMBAR LAMINECTOMY/DECOMPRESSION MICRODISCECTOMY;   Surgeon: Johnn Hai, MD;  Location: [facility];  Service: Orthopedics;  Laterality: N/A;  decompression laminectomy L2-3, L3-4, L4-5  . ORIF WRIST FRACTURE  01/11/2012   Procedure: OPEN REDUCTION INTERNAL FIXATION (ORIF) WRIST FRACTURE;  Surgeon: Tennis Must, MD;  Location: [facility];  Service: Orthopedics;  Laterality: Right;  . TEAR DUCT PROBING     X 2  . TOTAL HIP ARTHROPLASTY  2000   right  . TUBAL LIGATION      Family History  Problem Relation Age of Onset  . Heart attack Father 31  . Kidney disease Mother        renal failure  . Throat cancer Sister        smoker  . Osteoporosis Sister   . Heart attack Brother 20  . Stroke Brother 65       smoker  . Depression Maternal Uncle   . Cancer Brother        X3  lung cancer, all smokers  . Peripheral vascular disease Daughter   . Colon cancer Neg Hx   . Diabetes Neg Hx     Social history:  reports that she has never smoked. She has never used smokeless tobacco. She reports current alcohol use of about 3.0 standard drinks of  alcohol per week. She reports that she does not use drugs.    Allergies  Allergen Reactions  . Codeine Nausea Only  . Duloxetine Other (See Comments)    REACTION: intolerance--She does not remember taking this Rx- states she did not feel well   . Sertraline Hcl Other (See Comments)    REACTION: intolerance-- Did not help with Depression    Medications:  Prior to Admission medications   Medication Sig Start Date End Date Taking? Authorizing Provider  amLODipine (NORVASC) 5 MG tablet Take 1 tablet (5 mg total) by mouth daily. 08/03/19  Yes Ann Held, DO  apixaban (ELIQUIS) 5 MG TABS tablet Take 1 tablet (5 mg total) by mouth 2 (two) times daily. 11/17/19  Yes Dwyane Dee, MD  Ascorbic Acid (VITAMIN C) 1000 MG tablet Take 1,000 mg by mouth daily.    Yes [provider]  aspirin 81 MG tablet Take 81 mg by mouth every evening.    Yes [provider]    Calcium Carbonate (CALCIUM 600 PO) Take 1 capsule by mouth daily.    Yes [provider]  Cholecalciferol (VITAMIN D) 2000 UNITS CAPS Take 1 capsule by mouth daily.    Yes [provider]  furosemide (LASIX) 20 MG tablet Take 1 tablet (20 mg total) by mouth daily. 01/23/20  Yes Ann Held, DO  HYDROcodone-acetaminophen (NORCO/VICODIN) 5-325 MG tablet Take 1 tablet by mouth every 6 (six) hours as needed for moderate pain or severe pain.  06/18/16  Yes [provider]  losartan (COZAAR) 100 MG tablet TAKE 1 TABLET BY MOUTH EVERY DAY 11/01/19  Yes Lowne Chase, Yvonne R, DO  LYRICA 100 MG capsule Take 1 capsule (100 mg total) by mouth at bedtime. Patient taking differently: Take 100 mg by mouth at bedtime. Take along with 25 mg capsule=125 mg 09/12/19  Yes Suzzanne Cloud, NP  LYRICA 25 MG capsule Take 1 capsule (25 mg total) by mouth at bedtime. Take along with 100 mg capsule=125 mg 12/11/19  Yes Suzzanne Cloud, NP  metoprolol succinate (TOPROL-XL) 25 MG 24 hr tablet Take 1 tablet (25 mg total) by mouth daily. Take with or immediately following a meal. 01/21/20  Yes Camnitz, Ocie Doyne, MD  nortriptyline (PAMELOR) 10 MG capsule Take 1 capsule (10 mg total) by mouth at bedtime. 10/25/19  Yes Suzzanne Cloud, NP  Polyethyl Glycol-Propyl Glycol (SYSTANE ULTRA OP) Apply 1 drop to eye daily as needed (for dry eyes).   Yes [provider]    ROS:  Out of a complete 14 system review of symptoms, the patient complains only of the following symptoms, and all other reviewed systems are negative.  Back pain Walking difficulty Cold sensations in the feet  Blood pressure (!) 114/51, pulse 74, height 5\' 5"  (1.651 m), weight 182 lb (82.6 kg).  Physical Exam  General: The patient is alert and cooperative at the time of the examination.  Skin: No significant peripheral edema is noted.   Neurologic Exam  Mental status: The patient is alert and oriented x 3 at the  time of the examination. The patient has apparent normal recent and remote memory, with an apparently normal attention span and concentration ability.   Cranial nerves: Facial symmetry is present. Speech is normal, no aphasia or dysarthria is noted. Extraocular movements are full. Visual fields are full.  Motor: The patient has good strength in all 4 extremities.  Sensory examination: Soft touch sensation is symmetric on  the face, arms, and legs.  Coordination: The patient has good finger-nose-finger bilaterally.  The patient has difficulty performing heel-to-shin bilaterally.  Gait and station: The patient is able to ambulate with a walker, she has good stability using a walker.  Tandem gait is not attempted.  Romberg is negative but is slightly unsteady.  Reflexes: Deep tendon reflexes are symmetric.   Assessment/Plan:  1.  Peripheral neuropathy  2.  Gait instability  3.  Chronic low back pain  The patient does not wish to go up on any of her medications to help the back pain and the neuropathy discomfort.  I will send in prescriptions for her nortriptyline, and her Lyrica.  She will follow up here in 6 months.  Apparently, the patient does not do well with generic Lyrica, when she tried this product her neuropathy pain significantly worsened.  Jill Alexanders MD 02/26/2020 2:35 PM  [facility] [address] [place], [place] [address]  Phone [phone] Fax [phone]

## 2020-02-27 ENCOUNTER — Other Ambulatory Visit: Payer: Self-pay | Admitting: Emergency Medicine

## 2020-02-27 MED ORDER — LYRICA 100 MG PO CAPS
100.0000 mg | ORAL_CAPSULE | Freq: Every day | ORAL | 1 refills | Status: DC
Start: 2020-02-27 — End: 2020-09-09

## 2020-02-27 MED ORDER — LYRICA 25 MG PO CAPS
25.0000 mg | ORAL_CAPSULE | Freq: Every day | ORAL | 1 refills | Status: DC
Start: 2020-02-27 — End: 2020-05-05

## 2020-02-27 NOTE — Addendum Note (Signed)
Addended by: Kathrynn Ducking on: 02/27/2020 05:17 PM   Modules accepted: Orders

## 2020-02-27 NOTE — Telephone Encounter (Signed)
I called the patient, I will send in 90-day supply for the Lyrica.

## 2020-03-25 ENCOUNTER — Telehealth: Payer: Self-pay | Admitting: Cardiology

## 2020-03-25 MED ORDER — APIXABAN 5 MG PO TABS: 5.0000 mg | ORAL_TABLET | Freq: Two times a day (BID) | ORAL | 2 refills | Status: DC

## 2020-03-25 NOTE — Telephone Encounter (Addendum)
Eliquis 5mg  refill request received. Patient is 86 years old, weight-82.6kg, Crea-2.09 on 01/03/2020, Diagnosis-PE 11/08/2019 and Afib 11/10/2019, and last seen by Dr. Curt Bears on 01/21/2020.   Will need to clarify dose with pharmacist and if needed will send message to Dr. Curt Bears for dose change.  Megan PharmD clarified that since pt had an unprovoked PE that the pt would stay on higher dose of Eliquis 5mg  BID for 6 months then reduce to 2.5mg  February 5th, 2022. Will send in 5mg  BID at this time. Once 05/10/2020, pt will need to be reduced and Jinny Blossom will send staff message to ensure dose is reduced.

## 2020-03-25 NOTE — Telephone Encounter (Signed)
*  STAT* If patient is at the pharmacy, call can be transferred to refill team.   1. Which medications need to be refilled? (please list name of each medication and dose if known)  apixaban (ELIQUIS) 5 MG TABS tablet [[id number]]   2. Which pharmacy/location (including street and city if local pharmacy) is medication to be sent to? [id number] [place], [place] [facility] [place]  [address] [place] [address]  Phone:  [phone] Fax:  [phone]  3. Do they need a 30 day or 90 day supply? [place]

## 2020-03-31 ENCOUNTER — Ambulatory Visit: Payer: Medicare Other | Admitting: Pharmacist

## 2020-03-31 DIAGNOSIS — G609 Hereditary and idiopathic neuropathy, unspecified: Secondary | ICD-10-CM

## 2020-03-31 DIAGNOSIS — I480 Paroxysmal atrial fibrillation: Secondary | ICD-10-CM

## 2020-03-31 DIAGNOSIS — I10 Essential (primary) hypertension: Secondary | ICD-10-CM

## 2020-03-31 NOTE — Patient Instructions (Addendum)
Visit Information  Goals Addressed            This Visit's Progress   . Chronic Care Management Pharmacy Care Plan       CARE PLAN ENTRY (see longitudinal plan of care for additional care plan information)  Current Barriers:  . Chronic Disease Management support, education, and care coordination needs related to Hypertension, Hyperlipidemia, Pre-DM, AFib, Heart Failure, Neuropathy, Anxiety, Back Pain, Osteoporosis   Hypertension BP Readings from Last 3 Encounters:  02/26/20 (!) 114/51  02/01/20 120/80  01/21/20 132/72   . Pharmacist Clinical Goal(s): o Over the next 90days, patient will work with PharmD and providers to maintain BP goal <140/90 . Current regimen:  . Amlodipine 5mg  daily  . Losartan 100mg  daily . Metoprolol Succinate 25mg  daily . Interventions: o Discussed BP goals o Recommended patient purchase arm blood pressure cuff and record BP 2-3 times per week and record . Patient self care activities - Over the next 90 days, patient will: o Maintain hypertension medication regimen.  o Purchase arm blood pressure cuff and record BP 2-3 times per week and record  Hyperlipidemia Lab Results  Component Value Date/Time   LDLCALC 109 (H) 01/03/2020 04:05 PM   LDLDIRECT 129.0 10/18/2014 11:56 AM   . Pharmacist Clinical Goal(s): o Over the next 90 days, patient will work with PharmD and providers to maintain LDL goal < 100 . Current regimen:  o Diet and exercise management   . Interventions: o Discussed LDL goal . Patient self care activities - Over the next 90 days, patient will: o Maintain diet and exercise regiment  Pre-Diabetes Lab Results  Component Value Date/Time   HGBA1C 5.7 (H) 11/09/2019 01:50 AM   HGBA1C 5.6 08/24/2011 10:18 AM   . Pharmacist Clinical Goal(s): o Over the next 90 days, patient will work with PharmD and providers to maintain A1c goal <6.5% . Current regimen:  o Diet and exercise management   . Interventions: o Discussed a1c  goal . Patient self care activities - Over the next 90 days, patient will: o Maintain a1c <6.5%  AFib . Pharmacist Clinical Goal(s) o Over the next 90 days, patient will work with PharmD and providers to reduce risk of stroke and symptoms related to AFib . Current regimen:   Eliquis 5mg  twice daily  Metoprolol Succinate 25mg  daily . Interventions: o Discussed dosing of Eliquis o Discussed possibility of Patient Assistance Program for Eliquis . Patient self care activities - Over the next 90 days, patient will: o Maintain Afib medication regimen  Heart Failure . Pharmacist Clinical Goal(s) o Over the next 90 days, patient will work with PharmD and providers to reduce symptoms associated with heart failure . Current regimen:  o Furosemide 20mg  daily o Metoprolol Succinate 25mg  daily . Interventions: o Encouraged patient to weigh daily o Educated patient to notify office if she gains more than 3lbs in one Natosha Bou or 5lbs in one week . Patient self care activities - Over the next 90 days, patient will: o Weigh daily  o Notify office if she gains more than 3lbs in one Krisalyn Yankowski or 5lbs in one week  Neuropathy . Pharmacist Clinical Goal(s) o Over the next 90 days, patient will work with PharmD and providers to reduce symptoms associated with neuropathy . Current regimen:  . Lyrica 100mg  daily at bedtime . Lyrica 25mg  daily in the morning . Nortriptyline 10mg  daily . Alpha Lipoic Acid 200mg  daily . Interventions: o Discussed that patient was deficient in Vitamin B12 and  this could contribute to her neuropathy o Encouraged patient to try Vitamin B12 1070mcg daily over the counter o Collaboration with provider regarding medication management (consider rechecking Vitamin B12) . Patient self care activities - Over the next 90 days, patient will: o Have B12 rechecked at next office visit o Consider taking Vitamin B12 1032mcg   Osteoporosis . Pharmacist Clinical Goal(s) o Over the next 90  days, patient will work with PharmD and providers to reduce risk of fracture associated with osteoporosis . Current regimen:  . Calcium Carbonate 600mg  #2 daily  . Vitamin D 2000 units once daily . Interventions: o Consider taking calcium twice daily vs #2 tabs daily to increase absorption o Consider repeat DEXA Scan . Patient self care activities - Over the next 90 days, patient will: o Consider taking calcium twice daily vs #2 tabs daily to increase absorption o Consider repeat DEXA Scan  Health Maintenance  . Pharmacist Clinical Goal(s) o Over the next 180 days, patient will work with PharmD and providers to complete health maintenance screenings/vaccinations . Interventions: o Discussed Shingrix Vaccine Series . Patient self care activities - Over the next 180 days, patient will: o Complete Shingrix vaccine series  Medication management . Pharmacist Clinical Goal(s): o Over the next 90 days, patient will work with PharmD and providers to maintain optimal medication adherence . Current pharmacy: [facility] . Interventions o Comprehensive medication review performed. o Continue current medication management strategy . Patient self care activities - Over the next 90 days, patient will: o Focus on medication adherence by filling and taking medications appropriately  o Take medications as prescribed o Report any questions or concerns to PharmD and/or provider(s)  Please see past updates related to this goal by clicking on the "Past Updates" button in the selected goal         The patient verbalized understanding of instructions, educational materials, and care plan provided today and agreed to receive a mailed copy of patient instructions, educational materials, and care plan.   Telephone follow up appointment with pharmacy team member scheduled for:  09/29/2020  Melvenia Beam Bohdi [place], [facility]

## 2020-03-31 NOTE — Chronic Care Management (AMB) (Signed)
Chronic Care Management Pharmacy  Name: Krystal Henderson  MRN: [id number] DOB: [birth year]  Chief Complaint/ HPI  Krystal Henderson,  86 y.o. , female presents for their Follow-Up CCM visit with the clinical pharmacist via telephone due to COVID-19 Pandemic.  PCP : Ann Held, DO  Their chronic conditions include: Hypertension, Hyperlipidemia, Pre-DM, AFib, Heart Failure, Neuropathy, Anxiety, Back Pain, Osteoporosis  Office Visits: None since last CCM visit on 02/21/20.    Consult Visit: 03/25/20: Message from [place] to remain on Eliquis 5mg  BID for 6 months then reduce to Eliquis 2.5mg  05/10/2020.   02/26/20: Neuro visit w/ Dr. Jannifer Franklin - No med changes noted (pt did not want to increase med dosages). RTC 6 months  Medications: Outpatient Encounter Medications as of 03/31/2020  Medication Sig  . amLODipine (NORVASC) 5 MG tablet Take 1 tablet (5 mg total) by mouth daily.  [name] apixaban (ELIQUIS) 5 MG TABS tablet Take 1 tablet (5 mg total) by mouth 2 (two) times daily.  . Ascorbic Acid (VITAMIN C) 1000 MG tablet Take 1,000 mg by mouth daily.   [name] aspirin 81 MG tablet Take 81 mg by mouth every evening.   . Calcium Carbonate (CALCIUM 600 PO) Take 1 capsule by mouth daily.   . Cholecalciferol (VITAMIN D) 2000 UNITS CAPS Take 1 capsule by mouth daily.   . furosemide (LASIX) 20 MG tablet Take 1 tablet (20 mg total) by mouth daily.  [name] HYDROcodone-acetaminophen (NORCO/VICODIN) 5-325 MG tablet Take 1 tablet by mouth every 6 (six) hours as needed for moderate pain or severe pain.   [name] losartan (COZAAR) 100 MG tablet TAKE 1 TABLET BY MOUTH EVERY Faron Tudisco  . LYRICA 100 MG capsule Take 1 capsule (100 mg total) by mouth at bedtime.  [name] LYRICA 25 MG capsule Take 1 capsule (25 mg total) by mouth at bedtime.  . metoprolol succinate (TOPROL-XL) 25 MG 24 hr tablet Take 1 tablet (25 mg total) by mouth daily. Take with or immediately following a meal.  . nortriptyline (PAMELOR) 10 MG capsule Take 1  capsule (10 mg total) by mouth at bedtime.  [name] Glycol-Propyl Glycol (SYSTANE ULTRA OP) Apply 1 drop to eye daily as needed (for dry eyes).   No facility-administered encounter medications on file as of 03/31/2020.   SDOH Screenings   Alcohol Screen: Not on file  Depression (PHQ2-9): Low Risk   . PHQ-2 Score: 0  Financial Resource Strain: Medium Risk  . Difficulty of Paying Living Expenses: Somewhat hard  Food Insecurity: Not on file  Housing: Not on file  Physical Activity: Not on file  Social Connections: Not on file  Stress: Not on file  Tobacco Use: Low Risk   . Smoking Tobacco Use: Never Smoker  . Smokeless Tobacco Use: Never Used  Transportation Needs: Not on file     Current Diagnosis/Assessment:  Goals Addressed            This Visit's Progress   . Chronic Care Management Pharmacy Care Plan       CARE PLAN ENTRY (see longitudinal plan of care for additional care plan information)  Current Barriers:  . Chronic Disease Management support, education, and care coordination needs related to Hypertension, Hyperlipidemia, Pre-DM, AFib, Heart Failure, Neuropathy, Anxiety, Back Pain, Osteoporosis   Hypertension BP Readings from Last 3 Encounters:  02/26/20 (!) 114/51  02/01/20 120/80  01/21/20 132/72   . Pharmacist Clinical Goal(s): o Over the next 90days, patient will work with PharmD and  providers to maintain BP goal <140/90 . Current regimen:  . Amlodipine 5mg  daily  . Losartan 100mg  daily . Metoprolol Succinate 25mg  daily . Interventions: o Discussed BP goals o Recommended patient purchase arm blood pressure cuff and record BP 2-3 times per week and record . Patient self care activities - Over the next 90 days, patient will: o Maintain hypertension medication regimen.  o Purchase arm blood pressure cuff and record BP 2-3 times per week and record  Hyperlipidemia Lab Results  Component Value Date/Time   LDLCALC 109 (H) 01/03/2020 04:05 PM    LDLDIRECT 129.0 10/18/2014 11:56 AM   . Pharmacist Clinical Goal(s): o Over the next 90 days, patient will work with PharmD and providers to maintain LDL goal < 100 . Current regimen:  o Diet and exercise management   . Interventions: o Discussed LDL goal . Patient self care activities - Over the next 90 days, patient will: o Maintain diet and exercise regiment  Pre-Diabetes Lab Results  Component Value Date/Time   HGBA1C 5.7 (H) 11/09/2019 01:50 AM   HGBA1C 5.6 08/24/2011 10:18 AM   . Pharmacist Clinical Goal(s): o Over the next 90 days, patient will work with PharmD and providers to maintain A1c goal <6.5% . Current regimen:  o Diet and exercise management   . Interventions: o Discussed a1c goal . Patient self care activities - Over the next 90 days, patient will: o Maintain a1c <6.5%  AFib . Pharmacist Clinical Goal(s) o Over the next 90 days, patient will work with PharmD and providers to reduce risk of stroke and symptoms related to AFib . Current regimen:   Eliquis 5mg  twice daily  Metoprolol Succinate 25mg  daily . Interventions: o Discussed dosing of Eliquis o Discussed possibility of Patient Assistance Program for Eliquis . Patient self care activities - Over the next 90 days, patient will: o Maintain Afib medication regimen  Heart Failure . Pharmacist Clinical Goal(s) o Over the next 90 days, patient will work with PharmD and providers to reduce symptoms associated with heart failure . Current regimen:  o Furosemide 20mg  daily o Metoprolol Succinate 25mg  daily . Interventions: o Encouraged patient to weigh daily o Educated patient to notify office if she gains more than 3lbs in one Oretta Berkland or 5lbs in one week . Patient self care activities - Over the next 90 days, patient will: o Weigh daily  o Notify office if she gains more than 3lbs in one Rylyn Ranganathan or 5lbs in one week  Neuropathy . Pharmacist Clinical Goal(s) o Over the next 90 days, patient will work with  PharmD and providers to reduce symptoms associated with neuropathy . Current regimen:  . Lyrica 100mg  daily at bedtime . Lyrica 25mg  daily in the morning . Nortriptyline 10mg  daily . Alpha Lipoic Acid 200mg  daily . Interventions: o Discussed that patient was deficient in Vitamin B12 and this could contribute to her neuropathy o Encouraged patient to try Vitamin B12 1070mcg daily over the counter o Collaboration with provider regarding medication management (consider rechecking Vitamin B12) . Patient self care activities - Over the next 90 days, patient will: o Have B12 rechecked at next office visit o Consider taking Vitamin B12 1042mcg   Osteoporosis . Pharmacist Clinical Goal(s) o Over the next 90 days, patient will work with PharmD and providers to reduce risk of fracture associated with osteoporosis . Current regimen:  . Calcium Carbonate 600mg  #2 daily  . Vitamin D 2000 units once daily . Interventions: o Consider taking calcium twice  daily vs #2 tabs daily to increase absorption o Consider repeat DEXA Scan . Patient self care activities - Over the next 90 days, patient will: o Consider taking calcium twice daily vs #2 tabs daily to increase absorption o Consider repeat DEXA Scan  Health Maintenance  . Pharmacist Clinical Goal(s) o Over the next 180 days, patient will work with PharmD and providers to complete health maintenance screenings/vaccinations . Interventions: o Discussed Shingrix Vaccine Series . Patient self care activities - Over the next 180 days, patient will: o Complete Shingrix vaccine series  Medication management . Pharmacist Clinical Goal(s): o Over the next 90 days, patient will work with PharmD and providers to maintain optimal medication adherence . Current pharmacy: [facility] . Interventions o Comprehensive medication review performed. o Continue current medication management strategy . Patient self care activities - Over the next 90 days, patient  will: o Focus on medication adherence by filling and taking medications appropriately  o Take medications as prescribed o Report any questions or concerns to PharmD and/or provider(s)  Please see past updates related to this goal by clicking on the "Past Updates" button in the selected goal       Social Hx:  Born and raised in [place]. Married 57 years.  Had one daughter who passed away. Also had a grandson and he passed away. Does have a great grandson (3 yrs old; lives in [place]). No pets.   Hypertension   BP goal is:  <140/90  Office blood pressures are  BP Readings from Last 3 Encounters:  02/26/20 (!) 114/51  02/01/20 120/80  01/21/20 132/72   Patient checks BP at home infrequently (only has wrist cuff) Patient home BP readings are ranging: 130/60 this morning. States it has been normal when PT comes in and checks it.  Patient has failed these meds in the past: quinapril (listed in D/C meds. No apparent reason for D/C) Patient is currently controlled on the following medications:  . Amlodipine 5mg  daily  . Losartan 100mg  daily . Metoprolol Succinate 25mg  daily (started at 01/21/20 cardio visit)  Fill Dates Per Dispense Report - Filled Appropriately Amlodipine 10/28/19 - 90 DS 07/28/19 - 90 DS  Losartan 11/01/19 - 90 DS 07/28/19 - 90 DS  We discussed BP goals   Update 01/10/20 Had a therapist coming to her home twice a week and was told her BP was always good.  Update 02/21/20 Started metoprolol.  Hasn't been checking BP. States she isn't aware of the results of her monitor. Reviewed results notated in chart.  Denies chest pain, headaches, and dizziness. Recommended patient to get arm BP cuff to assure no s/sx of hypotension  Update 03/31/20 Get arm BP cuff? No, feels her wrist cuff is accurate (checked today and it was 133/66)   Plan -Continue current medications   AFIB   CMP Latest Ref Rng & Units 01/03/2020 11/12/2019 11/11/2019  Glucose 65 - 99 mg/dL 96 96  95  BUN 7 - 25 mg/dL 26(H) 38(H) 37(H)  Creatinine 0.60 - 0.88 mg/dL 2.09(H) 1.79(H) 1.87(H)  Sodium 135 - 146 mmol/L 137 131(L) 132(L)  Potassium 3.5 - 5.3 mmol/L 4.7 5.0 4.9  Chloride 98 - 110 mmol/L 103 99 101  CO2 20 - 32 mmol/L 16(L) 22 22  Calcium 8.6 - 10.4 mg/dL 9.6 8.6(L) 8.7(L)  Total Protein 6.1 - 8.1 g/dL 6.9 - -  Total Bilirubin 0.2 - 1.2 mg/dL 0.5 - -  Alkaline Phos 39 - 117 U/L - - -  AST  10 - 35 U/L 12 - -  ALT 6 - 29 U/L 7 - -   Patient has failed these meds in past: None noted  Patient is currently controlled on the following medications:  Eliquis 5mg  twice daily  Metoprolol Succinate 25mg  daily (started at 01/21/20 cardio visit)  Cost $133/month  For indication of PE treatment patient is on appropriate dose (No dose adjustment recommended; 5mg  BID).  For indication of Afib, recommended dose would be 2.5mg  BID noting patient age >84, and Scr >1.5   We discussed:  Dosing of Eliquis based on indications   Update 01/10/20 Scheduled follow up with cardiology? Yes on 01/21/20  Update 02/21/20 She doesn't feel she would qualify for PAP due to income.   Update 03/31/20 Noted cardio plan to continue Eliquis 5mg  BID until 05/10/20 then reduce to 2.5mg .  Plan -Continue current medications    Heart Failure   Type: Combined Systolic and Diastolic  Last ejection fraction: 11/09/19 45-50% NYHA Class: I (no actitivty limitation) AHA HF Stage: B (Heart disease present - no symptoms present)  Patient has failed these meds in past: None noted  Patient is currently controlled on the following medications:  Furosemide 20mg  daily  Reports she has bilateral edema at baseline. Legs are worse when she has her legs hanging down.  Denies SOB. Does not weigh daily. Acknowledges she was instructed to weigh daily. Reinforced daily weights  We discussed weighing daily; if you gain more than 3 pounds in one Nolberto Cheuvront or 5 pounds in one week call your doctor   Update  01/10/20 Weighing daily? No, weighed when therapy came into the home. Feels her weight was stable  Update 02/21/20 Swelling is still at baseline Denies SOB Weighed 177.5   Update 03/31/20 No change in swelling or weight.  Plan -Continue current medications  -Weigh self daily  Neuropathy   Followed by Neuropathy Butler Denmark, PA-C)  Vitamin B12 - 01/03/20 - 429  Patient has failed these meds in past: pregabalin (ineffective), gabapentin (ineffective), duloxetine (listed in allergies as did not feel well) Patient is currently uncontrolled on the following medications: . Lyrica 100mg  daily at bedtime . Lyrica 25mg  daily at bedtime . Nortriptyline 10mg  daily at bedtime . Alpha Lipoic Acid 200mg  daily  Is getting home PT. She feels it is helping. States she takes 25mg  Lyrica in the morning and 100mg  at bedtime Has taken lyrica for years. Nortriptyline is more recent. States Lyrica is expensive $68 25mg  $100100mg   We discussed:  how she is deficient and B12 and this could be a contributing factor to her neuropathy. Recommended patient to start B12 1027mcg daily and get B12 rechecked at visit w/ Dr. Etter Sjogren on 01/03/20   Update 01/10/20 Started B12 after last visit. Feet are not feeling any better. Going to neurologist on 02/20/20. She gets up 2-3 times per night to urinate. Could consider titrating nortriptyline to 25mg  daily, but this might be limited due to her feelings of drowsiness throughout the Akshat Minehart. She would actually like to taper off of nortryptyline.  We discussed use of alpha lipoic acid. Alpha lipoic acid reported to be possibly effective for neuropathy at doses of 600-1800mg  daily in one to 3 divided doses. Effects not seen for 3-5 weeks.   Considerations for a Plan -Stop nortriptyline, start duloxetine (pt open to this option possibly) AND/OR -Start alpha lipoic acid 600mg  daily for at least 5 weeks (no drug interactions noted)  Update 02/21/20 Saw neurologist?  No, cancelled appt until  11/23 She tried alpha lipoic acid, and she felt it did seem to help, but got scared, so she plans to wait until neurology appt  Update 03/31/20 States she started alpha lipoic acid.  She has the 200mg  capsules and takes one daily and feels this helps. Noted the upper limits of alpha lipoic acid, but patient states she would like to remain on lower dose due to pill burden  Plan -Continue current medications    Vaccines   Reviewed and discussed patient's vaccination history.    Immunization History  Administered Date(s) Administered  . Fluad Quad(high Dose 65+) 12/29/2018, 01/03/2020  . Influenza Split 01/01/2012  . Influenza Whole 01/03/2008, 12/30/2009  . Influenza, High Dose Seasonal PF 01/04/2013, 01/08/2014, 12/22/2017  . Influenza-Unspecified 12/19/2014, 12/25/2015, 01/11/2017  . PFIZER SARS-COV-2 Vaccination 05/14/2019, 06/08/2019, 02/01/2020  . Pneumococcal Conjugate-13 02/11/2014  . Pneumococcal Polysaccharide-23 12/30/2009, 01/01/2012  . Tdap 03/15/2011  . Zoster 04/30/2013   Update 01/10/20 Patient received flu vaccine at 01/03/20 visit w/ Dr. Etter Sjogren  Update 03/31/20 Shingrix? No  Plan -Recommended patient receive Shingrix vaccine in pharmacy.   Medication Management   Pt uses [facility] [facility] for all medications Uses pill box? Yes has an AM and PM box Pt endorses 100% compliance  We discussed: Discussed benefits of medication synchronization, packaging and delivery as well as enhanced pharmacist oversight with [employer]. Patient would like to think about it.  Miscellaneous Meds Vitamin C 1000mg  Systane eye drop  Meds to D/C from list Aspirin 81mg  (should be D/C per message from 11/16/19)  Plan  Continue current medication management strategy   Follow up:  6 month phone visit  Melvenia Beam Diasia Henken, PharmD, BCACP Clinical Pharmacist [facility] [phone]

## 2020-04-22 ENCOUNTER — Other Ambulatory Visit: Payer: Self-pay | Admitting: Family Medicine

## 2020-04-22 DIAGNOSIS — I10 Essential (primary) hypertension: Secondary | ICD-10-CM

## 2020-05-01 ENCOUNTER — Encounter: Payer: Self-pay | Admitting: Family Medicine

## 2020-05-01 DIAGNOSIS — I10 Essential (primary) hypertension: Secondary | ICD-10-CM

## 2020-05-01 MED ORDER — AMLODIPINE BESYLATE 5 MG PO TABS: 5.0000 mg | ORAL_TABLET | Freq: Every day | ORAL | 1 refills | Status: DC

## 2020-05-05 ENCOUNTER — Other Ambulatory Visit: Payer: Self-pay

## 2020-05-05 ENCOUNTER — Ambulatory Visit: Payer: Medicare Other | Admitting: Cardiology

## 2020-05-05 ENCOUNTER — Encounter: Payer: Self-pay | Admitting: Cardiology

## 2020-05-05 VITALS — BP 148/68 | HR 67 | Ht 65.0 in | Wt 181.0 lb

## 2020-05-05 DIAGNOSIS — I480 Paroxysmal atrial fibrillation: Secondary | ICD-10-CM | POA: Diagnosis not present

## 2020-05-05 MED ORDER — METOPROLOL SUCCINATE ER 25 MG PO TB24: 25.0000 mg | ORAL_TABLET | Freq: Every day | ORAL | 1 refills | Status: DC

## 2020-05-05 NOTE — Patient Instructions (Signed)
Medication Instructions:  °Your physician recommends that you continue on your current medications as directed. Please refer to the Current Medication list given to you today. ° °*If you need a refill on your cardiac medications before your next appointment, please call your pharmacy* ° ° °Lab Work: °None ordered ° ° °Testing/Procedures: °None ordered ° ° °Follow-Up: °At [facility], you and your health needs are our priority.  As part of our continuing mission to provide you with exceptional heart care, we have created designated Provider Care Teams.  These Care Teams include your primary Cardiologist (physician) and Advanced Practice Providers (APPs -  Physician Assistants and Nurse Practitioners) who all work together to provide you with the care you need, when you need it. ° °Your next appointment:   °6 month(s) ° °The format for your next appointment:   °In Person ° °Provider:   °Will Camnitz, MD ° ° ° °Thank you for choosing [facility]!! ° ° °Coraima Tibbs, RN °[phone] °  °

## 2020-05-05 NOTE — Progress Notes (Signed)
Electrophysiology Office Note   Date:  05/05/2020   ID:  Krystal Henderson, DOB May 27, 1932, MRN [id number]  PCP:  Carollee Herter, Alferd Apa, DO  Cardiologist:   Primary Electrophysiologist:  Thinh Cuccaro Meredith Leeds, MD    Chief Complaint: Atrial fibrillation   History of Present Illness: Krystal Henderson is a 87 y.o. female who is being seen today for the evaluation of atrial fibrillation at the request of Ann Held, *. Presenting today for electrophysiology evaluation.  She has a history significant for hypertension and hyperlipidemia.  She was seen by primary physician and was noted to have atrial fibrillation.  She was already on Eliquis due to a PE in the past.  She presented to hospital 11/08/2019 with shortness of breath and was found to have a pulmonary embolism.  11/10/2019, she went into atrial fibrillation, started on metoprolol.  Today, denies symptoms of palpitations, chest pain, shortness of breath, orthopnea, PND, lower extremity edema, claudication, dizziness, presyncope, syncope, bleeding, or neurologic sequela. The patient is tolerating medications without difficulties.  Since last being seen she has done well.  She has no chest pain or shortness of breath and is able to do all of her daily activities.  She is unaware of further episodes of atrial fibrillation.  Past Medical History:  Diagnosis Date  . Anemia    hx of   . Angiodysplasia 2007   @ colonoscopy  . Anxiety    PMH of  . Chronic low back pain 06/21/2014  . Diverticulosis of colon (without mention of hemorrhage)   . DJD (degenerative joint disease)   . Esophageal reflux    inactive  . History of vertebral fracture 11/2015  . HTN (hypertension)   . Hyperlipemia 2006   LDL 130  . Hypoglycemia    reactive  . Pelvic fracture ([place]) 12/30/2011   [facility]  . Peripheral neuropathy   . Personal history of colonic polyps    adenomatous  . Vitamin B12 deficiency    Past Surgical History:  Procedure  Laterality Date  . cataract surgery  12-26-12 and 01-09-13  . COLONOSCOPY  2011   neg  . COLONOSCOPY W/ POLYPECTOMY     X 2 , Dr  Verl Blalock; angiodysplasia. Due 2022  . DILATION AND CURETTAGE OF UTERUS    . FACIAL COSMETIC SURGERY    . HEMORROIDECTOMY    . LUMBAR LAMINECTOMY/DECOMPRESSION MICRODISCECTOMY  09/10/2011   Procedure: LUMBAR LAMINECTOMY/DECOMPRESSION MICRODISCECTOMY;  Surgeon: Johnn Hai, MD;  Location: [facility];  Service: Orthopedics;  Laterality: N/A;  decompression laminectomy L2-3, L3-4, L4-5  . ORIF WRIST FRACTURE  01/11/2012   Procedure: OPEN REDUCTION INTERNAL FIXATION (ORIF) WRIST FRACTURE;  Surgeon: Tennis Must, MD;  Location: [place];  Service: Orthopedics;  Laterality: Right;  . TEAR DUCT PROBING     X 2  . TOTAL HIP ARTHROPLASTY  2000   right  . TUBAL LIGATION       Current Outpatient Medications  Medication Sig Dispense Refill  . amLODipine (NORVASC) 5 MG tablet Take 1 tablet (5 mg total) by mouth daily. 90 tablet 1  . apixaban (ELIQUIS) 5 MG TABS tablet Take 1 tablet (5 mg total) by mouth 2 (two) times daily. 60 tablet 2  . Ascorbic Acid (VITAMIN C) 1000 MG tablet Take 1,000 mg by mouth daily.    . Calcium Carbonate (CALCIUM 600 PO) Take 1 capsule by mouth daily.    . Cholecalciferol (VITAMIN D) 2000 UNITS CAPS  Take 1 capsule by mouth daily.    . furosemide (LASIX) 20 MG tablet Take 1 tablet (20 mg total) by mouth daily. 90 tablet 1  . HYDROcodone-acetaminophen (NORCO/VICODIN) 5-325 MG tablet Take 1 tablet by mouth every 6 (six) hours as needed for moderate pain or severe pain.   0  . losartan (COZAAR) 100 MG tablet TAKE 1 TABLET BY MOUTH EVERY DAY 90 tablet 1  . LYRICA 100 MG capsule Take 1 capsule (100 mg total) by mouth at bedtime. 90 capsule 1  . nortriptyline (PAMELOR) 10 MG capsule Take 1 capsule (10 mg total) by mouth at bedtime. 90 capsule 3  . Polyethyl Glycol-Propyl Glycol (SYSTANE ULTRA OP) Apply 1 drop to eye daily as  needed (for dry eyes).    . metoprolol succinate (TOPROL-XL) 25 MG 24 hr tablet Take 1 tablet (25 mg total) by mouth daily. Take with or immediately following a meal. 90 tablet 1   No current facility-administered medications for this visit.    Allergies:   Codeine, Duloxetine, and Sertraline hcl   Social History:  The patient  reports that she has never smoked. She has never used smokeless tobacco. She reports current alcohol use of about 3.0 standard drinks of alcohol per week. She reports that she does not use drugs.   Family History:  The patient's family history includes Cancer in her brother; Depression in her maternal uncle; Heart attack (age of onset: 60) in her brother; Heart attack (age of onset: 62) in her father; Kidney disease in her mother; Osteoporosis in her sister; Peripheral vascular disease in her daughter; Stroke (age of onset: 43) in her brother; Throat cancer in her sister.   ROS:  Please see the history of present illness.   Otherwise, review of systems is positive for none.   All other systems are reviewed and negative.   PHYSICAL EXAM: VS:  BP (!) 148/68   Pulse 67   Ht 5\' 5"  (1.651 m)   Wt 181 lb (82.1 kg)   SpO2 98%   BMI 30.12 kg/m  , BMI Body mass index is 30.12 kg/m. GEN: Well nourished, well developed, in no acute distress  HEENT: normal  Neck: no JVD, carotid bruits, or masses Cardiac: RRR; no murmurs, rubs, or gallops,no edema  Respiratory:  clear to auscultation bilaterally, normal work of breathing GI: soft, nontender, nondistended, + BS MS: no deformity or atrophy  Skin: warm and dry Neuro:  Strength and sensation are intact Psych: euthymic mood, full affect  EKG:  EKG is ordered today. Personal review of the ekg ordered shows sinus rhythm, rate 64  Recent Labs: 11/08/2019: B Natriuretic Peptide 267.1 11/09/2019: TSH 2.432 11/12/2019: Hemoglobin 11.4; Magnesium 2.5; Platelets 325 01/03/2020: ALT 7; BUN 26; Creat 2.09; Potassium 4.7; Sodium 137     Lipid Panel     Component Value Date/Time   CHOL 196 01/03/2020 1605   TRIG 106 01/03/2020 1605   HDL 66 01/03/2020 1605   CHOLHDL 3.0 01/03/2020 1605   VLDL 10 11/09/2019 0150   LDLCALC 109 (H) 01/03/2020 1605   LDLDIRECT 129.0 10/18/2014 1156     Wt Readings from Last 3 Encounters:  05/05/20 181 lb (82.1 kg)  02/26/20 182 lb (82.6 kg)  02/01/20 171 lb 12.8 oz (77.9 kg)      Other studies Reviewed: Additional studies/ records that were reviewed today include: TTE 11/09/19  Review of the above records today demonstrates:  1. Mild global reduction in LV systolic function; grade 1  diastolic  dysfunction; right heart not well visualized.  2. Left ventricular ejection fraction, by estimation, is 45 to 50%. The  left ventricle has mildly decreased function. The left ventricle  demonstrates global hypokinesis. Left ventricular diastolic parameters are  consistent with Grade I diastolic  dysfunction (impaired relaxation). Elevated left atrial pressure.  3. Right ventricular systolic function is normal. The right ventricular  size is normal. Tricuspid regurgitation signal is inadequate for assessing  PA pressure.  4. Left atrial size was moderately dilated.  5. The mitral valve is normal in structure. Mild mitral valve  regurgitation. No evidence of mitral stenosis.  6. The aortic valve is tricuspid. Aortic valve regurgitation is mild. No  aortic stenosis is present.  7. Aortic dilatation noted. There is mild dilatation of the aortic root  measuring 39 mm.  8. The inferior vena cava is normal in size with greater than 50%  respiratory variability, suggesting right atrial pressure of 3 mmHg.   Cardiac monitor 02/15/2020 personally reviewed Max 174 bpm 06:30am, 10/25 Min 36 bpm 11:57pm, 11/03 Avg 61 bpm <1% ventricular ectopy 12.3% supraventricular ectopy Predominant underlying rhythm was sinus rhythm 5% atrial fibrillation burden 28 beat run of NSVT No symptoms  recorded  ASSESSMENT AND PLAN:  1.  Paroxysmal atrial fibrillation: Currently on Eliquis.  CHA2DS2-VASc of 4.  Cardiac monitor showed a 5% A. fib burden.  She is currently feeling well and remains in sinus rhythm.  No changes.  2.  Hypertension: Mildly elevated today.  Usually better controlled.  No changes.  3.  Pulmonary embolism: Continue Eliquis.  4.  Systolic heart failure: Found on most recent echo.  Currently on Toprol-XL and losartan.    Current medicines are reviewed at length with the patient today.   The patient does not have concerns regarding her medicines.  The following changes were made today: None  Labs/ tests ordered today include:  No orders of the defined types were placed in this encounter.    Disposition:   FU with Asher Babilonia 6 months  Signed, Braelynn Benning Meredith Leeds, MD  05/05/2020 2:32 PM     [facility] [address] [place]  [address] [phone] (office) [phone] (fax)

## 2020-05-07 NOTE — Addendum Note (Signed)
Addended by: Campbell Riches on: 05/07/2020 11:38 AM   Modules accepted: Orders

## 2020-05-09 ENCOUNTER — Telehealth: Payer: Self-pay | Admitting: Pharmacist

## 2020-05-09 MED ORDER — APIXABAN 2.5 MG PO TABS: 2.5000 mg | ORAL_TABLET | Freq: Two times a day (BID) | ORAL | 5 refills | Status: DC

## 2020-05-09 NOTE — Telephone Encounter (Signed)
Patient;s husband called back.  Repeated instructions given earlier today by [facility].  Husband voiced understanding

## 2020-05-09 NOTE — Telephone Encounter (Signed)
Pt returned call. I explained why we were decreasing the dose. She has 1 bottle of Eliquis left. I gave her the option of finishing out the 5mg  BID for 1 more month and then decreasing to 2.5mg  BID or she could cut the tablets in half and take 1/2 tab BID until she ran out. Then the next Rx would be for 2.5mg  tablets 1 tablet BID.  Pt seemed a little confused. She was going to have her husband call me back so I could explain to him too.

## 2020-05-09 NOTE — Telephone Encounter (Signed)
Called patient to review the dose reduction of Eliquis to 2.5mg  BID. Patient has completed 6 months of PE dosing of Eliquis. Now needs to be reduced to afib dosing. Left message with gentleman who answered phone to call me back as patient was sleeping.

## 2020-05-14 ENCOUNTER — Telehealth: Payer: Self-pay | Admitting: Pharmacist

## 2020-05-14 NOTE — Progress Notes (Addendum)
Chronic Care Management Pharmacy Assistant   Name: Krystal Henderson  MRN: [id number] DOB: Jun 24, 1932  Reason for Encounter:General Disease State Call.  Patient Questions:  1.  Have you seen any other providers since your last visit? Yes.   2.  Any changes in your medicines or health? Yes.   PCP : Ann Held, DO   Their chronic conditions include: Hypertension, Hyperlipidemia, Pre-DM, AFib, Heart Failure, Neuropathy, Anxiety, Back Pain, Osteoporosis.  Office Visits: None since 03/31/20  Consults: 05/09/20 (Telephone) Cardiology REDUCED Apixaban to 2.5 mg two times daily.  05/05/20 Cardiology Camnitz, Ocie Doyne, MD. STOPPED Aspirin and Pregablin 25 mg.  Allergies:   Allergies  Allergen Reactions   Codeine Nausea Only   Duloxetine Other (See Comments)    REACTION: intolerance--She does not remember taking this Rx- states she did not feel well    Sertraline Hcl Other (See Comments)    REACTION: intolerance-- Did not help with Depression    Medications: Outpatient Encounter Medications as of 05/14/2020  Medication Sig   amLODipine (NORVASC) 5 MG tablet Take 1 tablet (5 mg total) by mouth daily.   apixaban (ELIQUIS) 2.5 MG TABS tablet Take 1 tablet (2.5 mg total) by mouth 2 (two) times daily.   Ascorbic Acid (VITAMIN C) 1000 MG tablet Take 1,000 mg by mouth daily.   Calcium Carbonate (CALCIUM 600 PO) Take 1 capsule by mouth daily.   Cholecalciferol (VITAMIN D) 2000 UNITS CAPS Take 1 capsule by mouth daily.   furosemide (LASIX) 20 MG tablet Take 1 tablet (20 mg total) by mouth daily.   HYDROcodone-acetaminophen (NORCO/VICODIN) 5-325 MG tablet Take 1 tablet by mouth every 6 (six) hours as needed for moderate pain or severe pain.    losartan (COZAAR) 100 MG tablet TAKE 1 TABLET BY MOUTH EVERY DAY   LYRICA 100 MG capsule Take 1 capsule (100 mg total) by mouth at bedtime.   metoprolol succinate (TOPROL-XL) 25 MG 24 hr tablet Take 1 tablet (25 mg total) by mouth daily.  Take with or immediately following a meal.   nortriptyline (PAMELOR) 10 MG capsule Take 1 capsule (10 mg total) by mouth at bedtime.   Polyethyl Glycol-Propyl Glycol (SYSTANE ULTRA OP) Apply 1 drop to eye daily as needed (for dry eyes).   No facility-administered encounter medications on file as of 05/14/2020.    Current Diagnosis: Patient Active Problem List   Diagnosis Date Noted   Acute on chronic combined systolic and diastolic CHF (congestive heart failure) ([place]) 11/12/2019   AF (paroxysmal atrial fibrillation) ([place]) 11/10/2019   Impaired mobility and ADLs 11/10/2019   Acute respiratory failure with hypoxia ([place]) 11/08/2019   Acute pulmonary embolism ([place]) 11/08/2019   Impacted cerumen of left ear 01/03/2019   Lesion of skin of scalp 12/30/2018   Edema 06/22/2018   Iron deficiency anemia 04/02/2018   Lumbar compression fracture ([place]) 11/26/2015   Lower extremity edema 10/23/2015   Chronic low back pain 06/21/2014   Pain in joint, shoulder region 02/11/2014   Unspecified vitamin D deficiency 03/30/2013   CKD (chronic kidney disease), stage IV ([place]) 03/28/2013   Acute kidney injury ([place]) 01/01/2012   Spinal stenosis 12/23/2011   Osteopenia 12/23/2011   Renal insufficiency, mild 08/24/2011   Vitamin B 12 deficiency 09/24/2010   IBS (irritable bowel syndrome) 09/24/2010   Peripheral neuropathy, idiopathic 09/24/2010   GERD 08/12/2009   DIVERTICULOSIS, COLON 09/08/2007   Angiodysplasia of intestine with hemorrhage 09/08/2007   ARTHRITIS 09/08/2007   COLONIC POLYPS,  ADENOMATOUS, HX OF 09/08/2007   HYPERLIPIDEMIA 01/23/2007   Essential hypertension 01/23/2007   Osteoporosis 01/23/2007   HYPOGLYCEMIA, REACTIVE 06/30/2006    Goals Addressed   None    Patient stated she is feeling and doing better then she has been. She stated she walks around the house daily for about 5 min. She stated she doesn't do any house duties her husband does them for her. Patient stated she eats  fruits and she loves vegetables. She stated most of her meats are baked. She stated she drinks about 30 oz of water daily. Patient stated she does not have any concerns about her medications at this times.   Follow-Up:  Pharmacist Review   Charlann Lange, RMA Clinical Pharmacist Assistant [phone]  6 minutes spent in review, coordination, and documentation.  Reviewed by: Beverly Milch, PharmD Clinical Pharmacist [facility] [phone]

## 2020-05-30 ENCOUNTER — Telehealth: Payer: Self-pay | Admitting: Pharmacist

## 2020-05-30 NOTE — Progress Notes (Signed)
Chronic Care Management Pharmacy Assistant   Name: Krystal Henderson  MRN: [id number] DOB: [birth year]  Reason for Encounter: Adherence Review  Patient Questions:  1.  Have you seen any other providers since your last visit? No.   2.  Any changes in your medicines or health? No.    PCP : Ann Held, DO   Office Visits: None since 05/14/20  Consults: None since 05/14/20  Allergies:   Allergies  Allergen Reactions  . Codeine Nausea Only  . Duloxetine Other (See Comments)    REACTION: intolerance--She does not remember taking this Rx- states she did not feel well   . Sertraline Hcl Other (See Comments)    REACTION: intolerance-- Did not help with Depression    Medications: Outpatient Encounter Medications as of 05/30/2020  Medication Sig  . amLODipine (NORVASC) 5 MG tablet Take 1 tablet (5 mg total) by mouth daily.  [name] apixaban (ELIQUIS) 2.5 MG TABS tablet Take 1 tablet (2.5 mg total) by mouth 2 (two) times daily.  . Ascorbic Acid (VITAMIN C) 1000 MG tablet Take 1,000 mg by mouth daily.  . Calcium Carbonate (CALCIUM 600 PO) Take 1 capsule by mouth daily.  . Cholecalciferol (VITAMIN D) 2000 UNITS CAPS Take 1 capsule by mouth daily.  . furosemide (LASIX) 20 MG tablet Take 1 tablet (20 mg total) by mouth daily.  [name] HYDROcodone-acetaminophen (NORCO/VICODIN) 5-325 MG tablet Take 1 tablet by mouth every 6 (six) hours as needed for moderate pain or severe pain.   [name] losartan (COZAAR) 100 MG tablet TAKE 1 TABLET BY MOUTH EVERY DAY  . LYRICA 100 MG capsule Take 1 capsule (100 mg total) by mouth at bedtime.  . metoprolol succinate (TOPROL-XL) 25 MG 24 hr tablet Take 1 tablet (25 mg total) by mouth daily. Take with or immediately following a meal.  . nortriptyline (PAMELOR) 10 MG capsule Take 1 capsule (10 mg total) by mouth at bedtime.  [name] Glycol-Propyl Glycol (SYSTANE ULTRA OP) Apply 1 drop to eye daily as needed (for dry eyes).   No facility-administered encounter  medications on file as of 05/30/2020.    Current Diagnosis: Patient Active Problem List   Diagnosis Date Noted  . Acute on chronic combined systolic and diastolic CHF (congestive heart failure) ([place]) 11/12/2019  . AF (paroxysmal atrial fibrillation) ([place]) 11/10/2019  . Impaired mobility and ADLs 11/10/2019  . Acute respiratory failure with hypoxia ([place]) 11/08/2019  . Acute pulmonary embolism ([place]) 11/08/2019  . Impacted cerumen of left ear 01/03/2019  . Lesion of skin of scalp 12/30/2018  . Edema 06/22/2018  . Iron deficiency anemia 04/02/2018  . Lumbar compression fracture ([place]) 11/26/2015  . Lower extremity edema 10/23/2015  . Chronic low back pain 06/21/2014  . Pain in joint, shoulder region 02/11/2014  . Unspecified vitamin D deficiency 03/30/2013  . CKD (chronic kidney disease), stage IV ([place]) 03/28/2013  . Acute kidney injury ([place]) 01/01/2012  . Spinal stenosis 12/23/2011  . Osteopenia 12/23/2011  . Renal insufficiency, mild 08/24/2011  . Vitamin B 12 deficiency 09/24/2010  . IBS (irritable bowel syndrome) 09/24/2010  . Peripheral neuropathy, idiopathic 09/24/2010  . GERD 08/12/2009  . DIVERTICULOSIS, COLON 09/08/2007  . Angiodysplasia of intestine with hemorrhage 09/08/2007  . ARTHRITIS 09/08/2007  . COLONIC POLYPS, ADENOMATOUS, HX OF 09/08/2007  . HYPERLIPIDEMIA 01/23/2007  . Essential hypertension 01/23/2007  . Osteoporosis 01/23/2007  . HYPOGLYCEMIA, REACTIVE 06/30/2006    Goals Addressed   None    Reviewed the chart for  any health and/or medication changes, there was not any changes to be found at this time.  Follow-Up:  Pharmacist Review   Charlann Lange, [place] Pharmacist Assistant [phone]

## 2020-08-01 ENCOUNTER — Ambulatory Visit: Payer: Medicare Other | Admitting: Family Medicine

## 2020-08-07 ENCOUNTER — Encounter: Payer: Self-pay | Admitting: Family Medicine

## 2020-08-07 ENCOUNTER — Other Ambulatory Visit: Payer: Self-pay

## 2020-08-07 ENCOUNTER — Ambulatory Visit (INDEPENDENT_AMBULATORY_CARE_PROVIDER_SITE_OTHER): Payer: Medicare Other | Admitting: Family Medicine

## 2020-08-07 VITALS — BP 132/62 | HR 67 | Temp 97.5°F | Resp 18 | Ht 65.0 in | Wt 174.6 lb

## 2020-08-07 DIAGNOSIS — I872 Venous insufficiency (chronic) (peripheral): Secondary | ICD-10-CM | POA: Diagnosis not present

## 2020-08-07 DIAGNOSIS — I2699 Other pulmonary embolism without acute cor pulmonale: Secondary | ICD-10-CM

## 2020-08-07 DIAGNOSIS — I480 Paroxysmal atrial fibrillation: Secondary | ICD-10-CM | POA: Diagnosis not present

## 2020-08-07 DIAGNOSIS — G4700 Insomnia, unspecified: Secondary | ICD-10-CM | POA: Diagnosis not present

## 2020-08-07 DIAGNOSIS — R600 Localized edema: Secondary | ICD-10-CM | POA: Diagnosis not present

## 2020-08-07 DIAGNOSIS — I10 Essential (primary) hypertension: Secondary | ICD-10-CM | POA: Diagnosis not present

## 2020-08-07 DIAGNOSIS — I5043 Acute on chronic combined systolic (congestive) and diastolic (congestive) heart failure: Secondary | ICD-10-CM

## 2020-08-07 DIAGNOSIS — E559 Vitamin D deficiency, unspecified: Secondary | ICD-10-CM | POA: Diagnosis not present

## 2020-08-07 MED ORDER — AMLODIPINE BESYLATE 5 MG PO TABS: 5.0000 mg | ORAL_TABLET | Freq: Every day | ORAL | 1 refills | Status: DC

## 2020-08-07 MED ORDER — FUROSEMIDE 20 MG PO TABS: 20.0000 mg | ORAL_TABLET | Freq: Every day | ORAL | 1 refills | Status: DC

## 2020-08-07 MED ORDER — ALPRAZOLAM 0.25 MG PO TABS: 0.5000 mg | ORAL_TABLET | Freq: Every evening | ORAL | 1 refills | Status: DC | PRN

## 2020-08-07 MED ORDER — LOSARTAN POTASSIUM 100 MG PO TABS: 1.0000 | ORAL_TABLET | Freq: Every day | ORAL | 1 refills | Status: DC

## 2020-08-07 NOTE — Progress Notes (Signed)
Subjective:   By signing my name below, I, Shehryar Baig, attest that this documentation has been prepared under the direction and in the presence of Dr. [name], DO. 08/07/2020      Patient ID: Krystal Henderson, female    DOB: March 04, 1933, 87 y.o.   MRN: [id number]  Chief Complaint  Patient presents with  . Follow-up    6 month Concerns/ questions: pt has redness in right lower leg Pt asks for a refill of xanax- I do not see this on her medication list. -tlc,rma  . Hypertension    Taking Norvasc 5 mg, Losartan 100 mg, Metoprolol 25 mg daily.    HPI Patient is in today for a office visit. She is complaining about soreness and swelling in her right lower extremity. She notes that after waking up in the morning her legs are cold. She does not wear compression socks at home. She is requesting refills on 5 mg amlodipine daily PO and 100 mg losartan daily PO to manage her hypertension, xanax to manage her sleep, and 20 mg furosemide daily PO to help manage her edema. She denies having any fever, ear pain, congestion, sinus pain, sore throat, eye pain, chest pain, palpations, cough, shortness of breath, wheezing, nausea, vomiting, diarrhea, constipation, blood in stool, dysuria, frequency, hematuria, dizziness, or headaches, numbness, pain when walking at this time. She has received 3 Covid 19 vaccinations but is not willing to get the fourth vaccine at this time. She is interested in getting the shingles vaccination.   Past Medical History:  Diagnosis Date  . Anemia    hx of   . Angiodysplasia 2007   @ colonoscopy  . Anxiety    PMH of  . Chronic low back pain 06/21/2014  . Diverticulosis of colon (without mention of hemorrhage)   . DJD (degenerative joint disease)   . Esophageal reflux    inactive  . History of vertebral fracture 11/2015  . HTN (hypertension)   . Hyperlipemia 2006   LDL 130  . Hypoglycemia    reactive  . Pelvic fracture ([place]) 12/30/2011   [place] [facility]  . Peripheral neuropathy   . Personal history of colonic polyps    adenomatous  . Vitamin B12 deficiency     Past Surgical History:  Procedure Laterality Date  . cataract surgery  12-26-12 and 01-09-13  . COLONOSCOPY  2011   neg  . COLONOSCOPY W/ POLYPECTOMY     X 2 , Dr  Verl Blalock; angiodysplasia. Due 2022  . DILATION AND CURETTAGE OF UTERUS    . FACIAL COSMETIC SURGERY    . HEMORROIDECTOMY    . LUMBAR LAMINECTOMY/DECOMPRESSION MICRODISCECTOMY  09/10/2011   Procedure: LUMBAR LAMINECTOMY/DECOMPRESSION MICRODISCECTOMY;  Surgeon: Johnn Hai, MD;  Location: [facility];  Service: Orthopedics;  Laterality: N/A;  decompression laminectomy L2-3, L3-4, L4-5  . ORIF WRIST FRACTURE  01/11/2012   Procedure: OPEN REDUCTION INTERNAL FIXATION (ORIF) WRIST FRACTURE;  Surgeon: Tennis Must, MD;  Location: [place] [facility];  Service: Orthopedics;  Laterality: Right;  . TEAR DUCT PROBING     X 2  . TOTAL HIP ARTHROPLASTY  2000   right  . TUBAL LIGATION      Family History  Problem Relation Age of Onset  . Heart attack Father 42  . Kidney disease Mother        renal failure  . Throat cancer Sister        smoker  . Osteoporosis Sister   .  Heart attack Brother 64  . Stroke Brother 44       smoker  . Depression Maternal Uncle   . Cancer Brother        X3  lung cancer, all smokers  . Peripheral vascular disease Daughter   . Colon cancer Neg Hx   . Diabetes Neg Hx     Social History   Socioeconomic History  . Marital status: Married    Spouse name: Fritz Pickerel  . Number of children: 1  . Years of education: hs  . Highest education level: Not on file  Occupational History  . Occupation: retired    Fish farm manager: RETIRED  Tobacco Use  . Smoking status: Never Smoker  . Smokeless tobacco: Never Used  Substance and Sexual Activity  . Alcohol use: Yes    Alcohol/week: 3.0 standard drinks    Types: 3 Glasses of wine per week    Comment: wine occasionally  . Drug use:  No  . Sexual activity: Yes    Birth control/protection: Diaphragm  Other Topics Concern  . Not on file  Social History Narrative   Patient is right handed.   Patient does not drink caffeine.   Lives with husband   Social Determinants of Health   Financial Resource Strain: Medium Risk  . Difficulty of Paying Living Expenses: Somewhat hard  Food Insecurity: Not on file  Transportation Needs: Not on file  Physical Activity: Not on file  Stress: Not on file  Social Connections: Not on file  Intimate Partner Violence: Not on file    Outpatient Medications Prior to Visit  Medication Sig Dispense Refill  . apixaban (ELIQUIS) 2.5 MG TABS tablet Take 1 tablet (2.5 mg total) by mouth 2 (two) times daily. 60 tablet 5  . Ascorbic Acid (VITAMIN C) 1000 MG tablet Take 1,000 mg by mouth daily.    . Calcium Carbonate (CALCIUM 600 PO) Take 1 capsule by mouth daily.    . Cholecalciferol (VITAMIN D) 2000 UNITS CAPS Take 1 capsule by mouth daily.    [name] COVID-19 mRNA vaccine, Pfizer, 30 MCG/0.3ML injection INJECT AS DIRECTED .3 mL 0  . LYRICA 100 MG capsule Take 1 capsule (100 mg total) by mouth at bedtime. 90 capsule 1  . metoprolol succinate (TOPROL-XL) 25 MG 24 hr tablet Take 1 tablet (25 mg total) by mouth daily. Take with or immediately following a meal. 90 tablet 1  . nortriptyline (PAMELOR) 10 MG capsule Take 1 capsule (10 mg total) by mouth at bedtime. 90 capsule 3  . Polyethyl Glycol-Propyl Glycol (SYSTANE ULTRA OP) Apply 1 drop to eye daily as needed (for dry eyes).    [name] amLODipine (NORVASC) 5 MG tablet Take 1 tablet (5 mg total) by mouth daily. 90 tablet 1  . furosemide (LASIX) 20 MG tablet Take 1 tablet (20 mg total) by mouth daily. 90 tablet 1  . losartan (COZAAR) 100 MG tablet TAKE 1 TABLET BY MOUTH EVERY DAY 90 tablet 1  . HYDROcodone-acetaminophen (NORCO/VICODIN) 5-325 MG tablet Take 1 tablet by mouth every 6 (six) hours as needed for moderate pain or severe pain.  (Patient not taking:  Reported on 08/07/2020)  0   No facility-administered medications prior to visit.    Allergies  Allergen Reactions  . Codeine Nausea Only  . Duloxetine Other (See Comments)    REACTION: intolerance--She does not remember taking this Rx- states she did not feel well   . Sertraline Hcl Other (See Comments)    REACTION: intolerance-- Did not  help with Depression    Review of Systems  Constitutional: Negative for fever.  HENT: Negative for congestion, ear pain, sinus pain and sore throat.   Eyes: Negative for pain.  Respiratory: Negative for cough, shortness of breath and wheezing.   Cardiovascular: Positive for leg swelling. Negative for chest pain and palpitations.  Gastrointestinal: Negative for blood in stool, constipation, diarrhea, nausea and vomiting.  Genitourinary: Negative for dysuria, frequency and hematuria.  Musculoskeletal: Positive for myalgias (Right lower extremities).       (-)Pain while walking  Neurological: Negative for dizziness and headaches.       (-)In lower extremities        Objective:    Physical Exam Constitutional:      Appearance: Normal appearance.  HENT:     Head: Normocephalic and atraumatic.     Right Ear: External ear normal.     Left Ear: External ear normal.  Eyes:     Extraocular Movements: Extraocular movements intact.     Pupils: Pupils are equal, round, and reactive to light.  Cardiovascular:     Rate and Rhythm: Normal rate and regular rhythm.     Pulses: Normal pulses.     Heart sounds: Normal heart sounds. No murmur heard.   Pulmonary:     Effort: Pulmonary effort is normal. No respiratory distress.     Breath sounds: Normal breath sounds. No wheezing, rhonchi or rales.  Musculoskeletal:     Right lower leg: Pitting Edema present.       Legs:     Comments: Hyperpigmentation at right lower extremity.  Skin:    General: Skin is warm and dry.  Neurological:     Mental Status: She is alert and oriented to person, place, and  time.  Psychiatric:        Behavior: Behavior normal.     BP 132/62 (BP Location: Left Arm, Patient Position: Sitting, Cuff Size: Normal)   Pulse 67   Temp (!) 97.5 F (36.4 C) (Oral)   Resp 18   Ht 5\' 5"  (1.651 m)   Wt 174 lb 9.6 oz (79.2 kg)   SpO2 99%   BMI 29.05 kg/m  Wt Readings from Last 3 Encounters:  08/07/20 174 lb 9.6 oz (79.2 kg)  05/05/20 181 lb (82.1 kg)  02/26/20 182 lb (82.6 kg)    Diabetic Foot Exam - Simple   No data filed    Lab Results  Component Value Date   WBC 6.8 11/12/2019   HGB 11.4 (L) 11/12/2019   HCT 35.1 (L) 11/12/2019   PLT 325 11/12/2019   GLUCOSE 96 01/03/2020   CHOL 196 01/03/2020   TRIG 106 01/03/2020   HDL 66 01/03/2020   LDLDIRECT 129.0 10/18/2014   LDLCALC 109 (H) 01/03/2020   ALT 7 01/03/2020   AST 12 01/03/2020   NA 137 01/03/2020   K 4.7 01/03/2020   CL 103 01/03/2020   CREATININE 2.09 (H) 01/03/2020   BUN 26 (H) 01/03/2020   CO2 16 (L) 01/03/2020   TSH 2.432 11/09/2019   INR 1.4 (H) 11/08/2019   HGBA1C 5.7 (H) 11/09/2019    Lab Results  Component Value Date   TSH 2.432 11/09/2019   Lab Results  Component Value Date   WBC 6.8 11/12/2019   HGB 11.4 (L) 11/12/2019   HCT 35.1 (L) 11/12/2019   MCV 95.1 11/12/2019   PLT 325 11/12/2019   Lab Results  Component Value Date   NA 137 01/03/2020   K  4.7 01/03/2020   CO2 16 (L) 01/03/2020   GLUCOSE 96 01/03/2020   BUN 26 (H) 01/03/2020   CREATININE 2.09 (H) 01/03/2020   BILITOT 0.5 01/03/2020   ALKPHOS 53 07/02/2019   AST 12 01/03/2020   ALT 7 01/03/2020   PROT 6.9 01/03/2020   ALBUMIN 3.9 07/02/2019   CALCIUM 9.6 01/03/2020   ANIONGAP 10 11/12/2019   GFR 23.27 (L) 07/02/2019   Lab Results  Component Value Date   CHOL 196 01/03/2020   Lab Results  Component Value Date   HDL 66 01/03/2020   Lab Results  Component Value Date   LDLCALC 109 (H) 01/03/2020   Lab Results  Component Value Date   TRIG 106 01/03/2020   Lab Results  Component Value  Date   CHOLHDL 3.0 01/03/2020   Lab Results  Component Value Date   HGBA1C 5.7 (H) 11/09/2019       Assessment & Plan:   Problem List Items Addressed This Visit      Unprioritized   Acute on chronic combined systolic and diastolic CHF (congestive heart failure) ([place])    Per cardiology con't med       Relevant Medications   losartan (COZAAR) 100 MG tablet   furosemide (LASIX) 20 MG tablet   amLODipine (NORVASC) 5 MG tablet   Insomnia    Refill xanax today      Relevant Medications   ALPRAZolam (XANAX) 0.25 MG tablet   Lower extremity edema   Relevant Medications   furosemide (LASIX) 20 MG tablet   Primary hypertension - Primary   Relevant Medications   losartan (COZAAR) 100 MG tablet   furosemide (LASIX) 20 MG tablet   amLODipine (NORVASC) 5 MG tablet   Other Relevant Orders   Lipid panel   CBC with Differential/Platelet   Comprehensive metabolic panel   Venous stasis dermatitis of right lower extremity    Consider topical steroid cream but it is not bothering her  Compression hose Elevated legs        Other Visit Diagnoses    Vitamin D deficiency       Relevant Orders   Vitamin D (25 hydroxy)       Meds ordered this encounter  Medications  . losartan (COZAAR) 100 MG tablet    Sig: Take 1 tablet (100 mg total) by mouth daily.    Dispense:  90 tablet    Refill:  1  . furosemide (LASIX) 20 MG tablet    Sig: Take 1 tablet (20 mg total) by mouth daily.    Dispense:  90 tablet    Refill:  1  . amLODipine (NORVASC) 5 MG tablet    Sig: Take 1 tablet (5 mg total) by mouth daily.    Dispense:  90 tablet    Refill:  1  . ALPRAZolam (XANAX) 0.25 MG tablet    Sig: Take 2-4 tablets (0.5-1 mg total) by mouth at bedtime as needed. Anxiety    Dispense:  30 tablet    Refill:  1    I, Ann Held, DO, personally preformed the services described in this documentation.  All medical record entries made by the scribe were at my direction and in my  presence.  I have reviewed the chart and discharge instructions (if applicable) and agree that the record reflects my personal performance and is accurate and complete. 08/07/2020   I,Shehryar Baig,acting as a scribe for Ann Held, DO.,have documented all relevant documentation on the behalf  of Ann Held, DO,as directed by  Ann Held, DO while in the presence of Ann Held, DO.   Ann Held, DO

## 2020-08-07 NOTE — Assessment & Plan Note (Signed)
On eliquis °F/u cardiology °

## 2020-08-07 NOTE — Assessment & Plan Note (Signed)
Per cardiology con't med

## 2020-08-07 NOTE — Assessment & Plan Note (Signed)
On eliquis

## 2020-08-07 NOTE — Patient Instructions (Signed)

## 2020-08-07 NOTE — Assessment & Plan Note (Signed)
Refill xanax today

## 2020-08-07 NOTE — Assessment & Plan Note (Signed)
Consider topical steroid cream but it is not bothering her  Compression hose Elevated legs

## 2020-08-08 LAB — CBC WITH DIFFERENTIAL/PLATELET
Basophils Absolute: 0.1 10*3/uL (ref 0.0–0.1)
Basophils Relative: 0.8 % (ref 0.0–3.0)
Eosinophils Absolute: 0.1 10*3/uL (ref 0.0–0.7)
Eosinophils Relative: 2.2 % (ref 0.0–5.0)
HCT: 34.4 % — ABNORMAL LOW (ref 36.0–46.0)
Hemoglobin: 10.9 g/dL — ABNORMAL LOW (ref 12.0–15.0)
Lymphocytes Relative: 26.6 % (ref 12.0–46.0)
Lymphs Abs: 1.7 10*3/uL (ref 0.7–4.0)
MCHC: 31.7 g/dL (ref 30.0–36.0)
MCV: 90 fl (ref 78.0–100.0)
Monocytes Absolute: 0.7 10*3/uL (ref 0.1–1.0)
Monocytes Relative: 10.7 % (ref 3.0–12.0)
Neutro Abs: 3.9 10*3/uL (ref 1.4–7.7)
Neutrophils Relative %: 59.7 % (ref 43.0–77.0)
Platelets: 309 10*3/uL (ref 150.0–400.0)
RBC: 3.83 Mil/uL — ABNORMAL LOW (ref 3.87–5.11)
RDW: 17 % — ABNORMAL HIGH (ref 11.5–15.5)
WBC: 6.5 10*3/uL (ref 4.0–10.5)

## 2020-08-08 LAB — LIPID PANEL
Cholesterol: 200 mg/dL (ref 0–200)
HDL: 74.3 mg/dL (ref >39.00)
LDL Cholesterol: 110 mg/dL — ABNORMAL HIGH (ref 0–99)
NonHDL: 125.46
Total CHOL/HDL Ratio: 3
Triglycerides: 75 mg/dL (ref 0.0–149.0)
VLDL: 15 mg/dL (ref 0.0–40.0)

## 2020-08-08 LAB — COMPREHENSIVE METABOLIC PANEL
ALT: 8 U/L (ref 0–35)
AST: 9 U/L (ref 0–37)
Albumin: 3.9 g/dL (ref 3.5–5.2)
Alkaline Phosphatase: 66 U/L (ref 39–117)
BUN: 26 mg/dL — ABNORMAL HIGH (ref 6–23)
CO2: 26 mEq/L (ref 19–32)
Calcium: 9.8 mg/dL (ref 8.4–10.5)
Chloride: 104 mEq/L (ref 96–112)
Creatinine, Ser: 2.12 mg/dL — ABNORMAL HIGH (ref 0.40–1.20)
GFR: 20.75 mL/min — ABNORMAL LOW (ref >60.00)
Glucose, Bld: 79 mg/dL (ref 70–99)
Potassium: 4.9 mEq/L (ref 3.5–5.1)
Sodium: 139 mEq/L (ref 135–145)
Total Bilirubin: 0.5 mg/dL (ref 0.2–1.2)
Total Protein: 6.8 g/dL (ref 6.0–8.3)

## 2020-08-08 LAB — VITAMIN D 25 HYDROXY (VIT D DEFICIENCY, FRACTURES): VITD: 63.76 ng/mL (ref 30.00–100.00)

## 2020-08-28 ENCOUNTER — Ambulatory Visit: Payer: Medicare Other | Admitting: Neurology

## 2020-09-09 ENCOUNTER — Encounter: Payer: Self-pay | Admitting: Neurology

## 2020-09-09 ENCOUNTER — Ambulatory Visit: Payer: Medicare Other | Admitting: Neurology

## 2020-09-09 VITALS — BP 123/67 | HR 68 | Ht 65.0 in | Wt 174.4 lb

## 2020-09-09 DIAGNOSIS — G609 Hereditary and idiopathic neuropathy, unspecified: Secondary | ICD-10-CM

## 2020-09-09 MED ORDER — LYRICA 100 MG PO CAPS: 100.0000 mg | ORAL_CAPSULE | Freq: Every day | ORAL | 1 refills | Status: DC

## 2020-09-09 NOTE — Progress Notes (Signed)
Reason for visit: Chronic low back pain, peripheral neuropathy, gait disorder  Krystal Henderson is an 87 y.o. female  History of present illness:  Krystal Henderson is an 87 year old right-handed white female with a history of a peripheral neuropathy.  The patient does have some discomfort in the feet but the Lyrica seems to help this fairly well.  She is mainly bothered by low back pain that is worse with standing, and seems to limit how far she can walk.  She has been diagnosed as well with atrial fibrillation, she is on chronic anticoagulation.  The patient feels much better with her back when she lies down, she sleeps fairly well at night.  She uses a walker inside and outside the house, she has not had any falls.  She uses brand-name Lyrica, the generic medication was not effective for her.  She has some ankle swelling on the medication.  Past Medical History:  Diagnosis Date  . Anemia    hx of   . Angiodysplasia 2007   @ colonoscopy  . Anxiety    PMH of  . Chronic low back pain 06/21/2014  . Diverticulosis of colon (without mention of hemorrhage)   . DJD (degenerative joint disease)   . Esophageal reflux    inactive  . History of vertebral fracture 11/2015  . HTN (hypertension)   . Hyperlipemia 2006   LDL 130  . Hypoglycemia    reactive  . Pelvic fracture ([place]) 12/30/2011   [facility]  . Peripheral neuropathy   . Personal history of colonic polyps    adenomatous  . Vitamin B12 deficiency     Past Surgical History:  Procedure Laterality Date  . cataract surgery  12-26-12 and 01-09-13  . COLONOSCOPY  2011   neg  . COLONOSCOPY W/ POLYPECTOMY     X 2 , Dr  Verl Blalock; angiodysplasia. Due 2022  . DILATION AND CURETTAGE OF UTERUS    . FACIAL COSMETIC SURGERY    . HEMORROIDECTOMY    . LUMBAR LAMINECTOMY/DECOMPRESSION MICRODISCECTOMY  09/10/2011   Procedure: LUMBAR LAMINECTOMY/DECOMPRESSION MICRODISCECTOMY;  Surgeon: Johnn Hai, MD;  Location: [facility];  Service:  Orthopedics;  Laterality: N/A;  decompression laminectomy L2-3, L3-4, L4-5  . ORIF WRIST FRACTURE  01/11/2012   Procedure: OPEN REDUCTION INTERNAL FIXATION (ORIF) WRIST FRACTURE;  Surgeon: Tennis Must, MD;  Location: [facility];  Service: Orthopedics;  Laterality: Right;  . TEAR DUCT PROBING     X 2  . TOTAL HIP ARTHROPLASTY  2000   right  . TUBAL LIGATION      Family History  Problem Relation Age of Onset  . Heart attack Father 72  . Kidney disease Mother        renal failure  . Throat cancer Sister        smoker  . Osteoporosis Sister   . Heart attack Brother 75  . Stroke Brother 32       smoker  . Depression Maternal Uncle   . Cancer Brother        X3  lung cancer, all smokers  . Peripheral vascular disease Daughter   . Colon cancer Neg Hx   . Diabetes Neg Hx     Social history:  reports that she has never smoked. She has never used smokeless tobacco. She reports current alcohol use of about 3.0 standard drinks of alcohol per week. She reports that she does not use drugs.    Allergies  Allergen  Reactions  . Codeine Nausea Only  . Duloxetine Other (See Comments)    REACTION: intolerance--She does not remember taking this Rx- states she did not feel well   . Sertraline Hcl Other (See Comments)    REACTION: intolerance-- Did not help with Depression    Medications:  Prior to Admission medications   Medication Sig Start Date End Date Taking? Authorizing Provider  ALPRAZolam (XANAX) 0.25 MG tablet Take 2-4 tablets (0.5-1 mg total) by mouth at bedtime as needed. Anxiety 08/07/20  Yes [name] R, DO  amLODipine (NORVASC) 5 MG tablet Take 1 tablet (5 mg total) by mouth daily. 08/07/20  Yes Ann Held, DO  apixaban (ELIQUIS) 2.5 MG TABS tablet Take 1 tablet (2.5 mg total) by mouth 2 (two) times daily. 05/09/20  Yes Camnitz, Ocie Doyne, MD  Ascorbic Acid (VITAMIN C) 1000 MG tablet Take 1,000 mg by mouth daily.   Yes [provider]   Calcium Carbonate (CALCIUM 600 PO) Take 1 capsule by mouth daily.   Yes [provider]  Cholecalciferol (VITAMIN D) 2000 UNITS CAPS Take 1 capsule by mouth daily.   Yes [provider]  furosemide (LASIX) 20 MG tablet Take 1 tablet (20 mg total) by mouth daily. 08/07/20  Yes [name] R, DO  losartan (COZAAR) 100 MG tablet Take 1 tablet (100 mg total) by mouth daily. 08/07/20  Yes Lowne Chase, Yvonne R, DO  LYRICA 100 MG capsule Take 1 capsule (100 mg total) by mouth at bedtime. 02/27/20  Yes Kathrynn Ducking, MD  metoprolol succinate (TOPROL-XL) 25 MG 24 hr tablet Take 1 tablet (25 mg total) by mouth daily. Take with or immediately following a meal. 05/05/20  Yes Camnitz, Ocie Doyne, MD  nortriptyline (PAMELOR) 10 MG capsule Take 1 capsule (10 mg total) by mouth at bedtime. 02/26/20  Yes Kathrynn Ducking, MD  Polyethyl Glycol-Propyl Glycol (SYSTANE ULTRA OP) Apply 1 drop to eye daily as needed (for dry eyes).   Yes [provider]  COVID-19 mRNA vaccine, Doyline, 30 MCG/0.3ML injection INJECT AS DIRECTED 02/01/20 01/31/21  Carlyle Basques, MD    ROS:  Out of a complete 14 system review of symptoms, the patient complains only of the following symptoms, and all other reviewed systems are negative.  Walking difficulty Back pain  Blood pressure 123/67, pulse 68, height 5\' 5"  (1.651 m), weight 174 lb 6.4 oz (79.1 kg).  Physical Exam  General: The patient is alert and cooperative at the time of the examination.  Skin: 1-2+ edema below the knees is seen bilaterally.   Neurologic Exam  Mental status: The patient is alert and oriented x 3 at the time of the examination. The patient has apparent normal recent and remote memory, with an apparently normal attention span and concentration ability.   Cranial nerves: Facial symmetry is present. Speech is normal, no aphasia or dysarthria is noted. Extraocular movements are full. Visual fields are full.  Motor:  The patient has good strength in all 4 extremities.  Sensory examination: Soft touch sensation is symmetric on the face, arms, and legs.  Coordination: The patient has good finger-nose-finger and heel-to-shin bilaterally.  Gait and station: The patient has a somewhat wide-based gait, she is able to walk fairly well with a walker with good stability.  A stooped posture is noted.  Tandem gait was not attempted.  Reflexes: Deep tendon reflexes are symmetric.  Reflexes are quite normal with exception of depression of ankle jerk reflexes  bilaterally.   Assessment/Plan:  1.  Peripheral neuropathy  2.  Chronic low back pain  The patient will continue her nortriptyline in low-dose and her Lyrica.  She does not wish to try to go up on the medications to help her back pain.  A prescription for the Lyrica was sent in.  She will follow-up here in 6 months, in the future she may be followed by Dr. Krista Blue.  Jill Alexanders MD 09/09/2020 4:20 PM  [facility] [address] [place], [place] [address]  Phone [phone] Fax [phone]

## 2020-09-12 DIAGNOSIS — Z20822 Contact with and (suspected) exposure to covid-19: Secondary | ICD-10-CM | POA: Diagnosis not present

## 2020-09-29 ENCOUNTER — Ambulatory Visit (INDEPENDENT_AMBULATORY_CARE_PROVIDER_SITE_OTHER): Payer: Medicare Other | Admitting: Pharmacist

## 2020-09-29 DIAGNOSIS — I10 Essential (primary) hypertension: Secondary | ICD-10-CM | POA: Diagnosis not present

## 2020-09-29 DIAGNOSIS — I480 Paroxysmal atrial fibrillation: Secondary | ICD-10-CM | POA: Diagnosis not present

## 2020-09-29 DIAGNOSIS — N184 Chronic kidney disease, stage 4 (severe): Secondary | ICD-10-CM

## 2020-09-29 DIAGNOSIS — E782 Mixed hyperlipidemia: Secondary | ICD-10-CM

## 2020-09-29 DIAGNOSIS — R600 Localized edema: Secondary | ICD-10-CM

## 2020-09-29 NOTE — Progress Notes (Deleted)
Chronic Care Management Pharmacy Note  09/29/2020 Name:  Krystal Henderson MRN:  [id number] DOB:  [birth year]  Summary:  Recommendations/Changes made from today's visit:  Plan:  Subjective: Krystal Henderson is an 87 y.o. year old female who is a primary patient of Ann Held, DO.  The CCM team was consulted for assistance with disease management and care coordination needs.    {CCMTELEPHONEFACETOFACE:21091510} for {CCMINITIALFOLLOWUPCHOICE:21091511} in response to provider referral for pharmacy case management and/or care coordination services.   Consent to Services:  {CCMCONSENTOPTIONS:25074}  Patient Care Team: Carollee Herter, Alferd Apa, DO as PCP - General (Family Medicine) Edrick Oh, MD as Consulting Physician (Nephrology) Kathrynn Ducking, MD as Consulting Physician (Neurology) Renard Hamper, Franchot Gallo, PA-C as Counselor (Dermatology) Day, Melvenia Beam, [facility] (Inactive) as Pharmacist (Pharmacist)  Recent office visits: ***  Recent consult visits: [facility] visits: {Hospital DC Yes/No:21091515}  Objective:  Lab Results  Component Value Date   CREATININE 2.12 (H) 08/07/2020   CREATININE 2.09 (H) 01/03/2020   CREATININE 1.79 (H) 11/12/2019    Lab Results  Component Value Date   HGBA1C 5.7 (H) 11/09/2019   Last diabetic Eye exam: No results found for: HMDIABEYEEXA  Last diabetic Foot exam: No results found for: HMDIABFOOTEX      Component Value Date/Time   CHOL 200 08/07/2020 1521   TRIG 75.0 08/07/2020 1521   HDL 74.30 08/07/2020 1521   CHOLHDL 3 08/07/2020 1521   VLDL 15.0 08/07/2020 1521   LDLCALC 110 (H) 08/07/2020 1521   LDLCALC 109 (H) 01/03/2020 1605   LDLDIRECT 129.0 10/18/2014 1156    Hepatic Function Latest Ref Rng & Units 08/07/2020 01/03/2020 07/02/2019  Total Protein 6.0 - 8.3 g/dL 6.8 6.9 6.5  Albumin 3.5 - 5.2 g/dL 3.9 - 3.9  AST 0 - 37 U/L '9 12 11  ' ALT 0 - 35 U/L '8 7 11  ' Alk Phosphatase 39 - 117 U/L 66 - 53  Total Bilirubin 0.2 -  1.2 mg/dL 0.5 0.5 0.4  Bilirubin, Direct 0.0 - 0.3 mg/dL - - -    Lab Results  Component Value Date/Time   TSH 2.432 11/09/2019 01:50 AM   TSH 3.53 11/26/2013 09:03 AM   TSH 3.28 03/27/2013 11:26 AM   FREET4 1.1 10/02/2008 11:33 AM   FREET4 1.0 08/17/2007 11:47 AM    CBC Latest Ref Rng & Units 08/07/2020 11/12/2019 11/11/2019  WBC 4.0 - 10.5 K/uL 6.5 6.8 7.1  Hemoglobin 12.0 - 15.0 g/dL 10.9(L) 11.4(L) 11.6(L)  Hematocrit 36.0 - 46.0 % 34.4(L) 35.1(L) 35.5(L)  Platelets 150.0 - 400.0 K/uL 309.0 325 307    Lab Results  Component Value Date/Time   VD25OH 63.76 08/07/2020 03:21 PM   VD25OH 58 12/23/2011 02:48 PM   VD25OH 60 03/15/2011 10:39 AM    Clinical ASCVD: {YES/NO:21197} The ASCVD Risk score Mikey Bussing DC Jr., et al., 2013) failed to calculate for the following reasons:   The 2013 ASCVD risk score is only valid for ages 51 to 42    Other:  CHADS2VASc = 4  DEXA: 06/08/2016  Fractures: Foot, Lumbar Spine, Wrist Treatments: Calcium, Estrogen, Fosamax(Alendronate), Multivitamin, Vitamin D ASSESSMENT: The BMD measured at Forearm Radius 33% is 0.614 g/cm2 with a T-score of -3.0. This patient is considered osteoporotic according to [facility]) criteria. Lumbar spine was not utilized due to surgical changes and compression fracture. The right femur was not scanned due to the patient having a right hip replacement.   Site Region Measured  Date Measured Age WHO YA BMD Classification T-score Left Femur Neck 8/08/[age] Osteopenia -1.8 0.783 g/cm2 Left Forearm Radius 33% 06/08/2016 82.2 years Osteoporosis -3.0 0.614 g/cm2 Social History   Tobacco Use  Smoking Status Never  Smokeless Tobacco Never   BP Readings from Last 3 Encounters:  09/09/20 123/67  08/07/20 132/62  05/05/20 (!) 148/68   Pulse Readings from Last 3 Encounters:  09/09/20 68  08/07/20 67  05/05/20 67   Wt Readings from Last 3 Encounters:  09/09/20 174 lb 6.4 oz (79.1 kg)   08/07/20 174 lb 9.6 oz (79.2 kg)  05/05/20 181 lb (82.1 kg)    Assessment: Review of patient past medical history, allergies, medications, health status, including review of consultants reports, laboratory and other test data, was performed as part of comprehensive evaluation and provision of chronic care management services.   SDOH:  (Social Determinants of Health) assessments and interventions performed:    CCM Care Plan  Allergies  Allergen Reactions   Codeine Nausea Only   Duloxetine Other (See Comments)    REACTION: intolerance--She does not remember taking this Rx- states she did not feel well    Sertraline Hcl Other (See Comments)    REACTION: intolerance-- Did not help with Depression    Medications Reviewed Today     Reviewed by Kathrynn Ducking, MD (Physician) on 09/09/20 at [place] List Status: <None>   Medication Order Taking? Sig Documenting Provider Last Dose Status Informant  ALPRAZolam (XANAX) 0.25 MG tablet [id number] Yes Take 2-4 tablets (0.5-1 mg total) by mouth at bedtime as needed. Anxiety [name] R, DO Taking Active   amLODipine (NORVASC) 5 MG tablet [id number] Yes Take 1 tablet (5 mg total) by mouth daily. Ann Held, DO Taking Active   apixaban (ELIQUIS) 2.5 MG TABS tablet [id number] Yes Take 1 tablet (2.5 mg total) by mouth 2 (two) times daily. Constance Haw, MD Taking Active   Ascorbic Acid (VITAMIN C) 1000 MG tablet [id number] Yes Take 1,000 mg by mouth daily. [provider] Taking Active Self  Calcium Carbonate (CALCIUM 600 PO) [id number] Yes Take 1 capsule by mouth daily. [provider] Taking Active Self  Cholecalciferol (VITAMIN D) 2000 UNITS CAPS [id number] Yes Take 1 capsule by mouth daily. [provider] Taking Active Self  COVID-19 mRNA vaccine, Pfizer, 30 MCG/0.3ML injection [id number]  INJECT AS DIRECTED Carlyle Basques, MD  Consider Medication Status and Discontinue   furosemide (LASIX) 20 MG  tablet [id number] Yes Take 1 tablet (20 mg total) by mouth daily. Ann Held, DO Taking Active   losartan (COZAAR) 100 MG tablet [id number] Yes Take 1 tablet (100 mg total) by mouth daily. Ann Held, DO Taking Active   LYRICA 100 MG capsule [id number] Yes Take 1 capsule (100 mg total) by mouth at bedtime. Kathrynn Ducking, MD Taking Active   metoprolol succinate (TOPROL-XL) 25 MG 24 hr tablet [id number] Yes Take 1 tablet (25 mg total) by mouth daily. Take with or immediately following a meal. Camnitz, Will Hassell Done, MD Taking Active   nortriptyline (PAMELOR) 10 MG capsule [id number] Yes Take 1 capsule (10 mg total) by mouth at bedtime. Kathrynn Ducking, MD Taking Active   Polyethyl Glycol-Propyl Glycol (SYSTANE ULTRA OP) [id number] Yes Apply 1 drop to eye daily as needed (for dry eyes). [provider] Taking Active Self            Patient Active Problem List   Diagnosis  Date Noted   Venous stasis dermatitis of right lower extremity 08/07/2020   Insomnia 08/07/2020   Acute on chronic combined systolic and diastolic CHF (congestive heart failure) ([place]) 11/12/2019   AF (paroxysmal atrial fibrillation) ([place]) 11/10/2019   Impaired mobility and ADLs 11/10/2019   Acute respiratory failure with hypoxia ([place]) 11/08/2019   Acute pulmonary embolism ([place]) 11/08/2019   Impacted cerumen of left ear 01/03/2019   Lesion of skin of scalp 12/30/2018   Edema 06/22/2018   Iron deficiency anemia 04/02/2018   Lumbar compression fracture ([place]) 11/26/2015   Lower extremity edema 10/23/2015   Chronic low back pain 06/21/2014   Pain in joint, shoulder region 02/11/2014   Unspecified vitamin D deficiency 03/30/2013   CKD (chronic kidney disease), stage IV ([place]) 03/28/2013   Acute kidney injury ([place]) 01/01/2012   Spinal stenosis 12/23/2011   Osteopenia 12/23/2011   Renal insufficiency, mild 08/24/2011   Vitamin B 12 deficiency 09/24/2010   IBS (irritable bowel syndrome) 09/24/2010    Peripheral neuropathy, idiopathic 09/24/2010   GERD 08/12/2009   DIVERTICULOSIS, COLON 09/08/2007   Angiodysplasia of intestine with hemorrhage 09/08/2007   ARTHRITIS 09/08/2007   COLONIC POLYPS, ADENOMATOUS, HX OF 09/08/2007   HYPERLIPIDEMIA 01/23/2007   Primary hypertension 01/23/2007   Osteoporosis 01/23/2007   HYPOGLYCEMIA, REACTIVE 06/30/2006    Immunization History  Administered Date(s) Administered   Fluad Quad(high Dose 65+) 12/29/2018, 01/03/2020   Influenza Split 01/01/2012   Influenza Whole 01/03/2008, 12/30/2009   Influenza, High Dose Seasonal PF 01/04/2013, 01/08/2014, 12/22/2017   Influenza-Unspecified 12/19/2014, 12/25/2015, 01/11/2017   PFIZER(Purple Top)SARS-COV-2 Vaccination 05/14/2019, 06/08/2019, 02/01/2020   Pneumococcal Conjugate-13 02/11/2014   Pneumococcal Polysaccharide-23 12/30/2009, 01/01/2012   Tdap 03/15/2011   Zoster, Live 04/30/2013    Conditions to be addressed/monitored: {CCM ASSESSMENT DISEASE OPTIONS:25047}  There are no care plans that you recently modified to display for this patient.   Medication Assistance: {MEDASSISTANCEINFO:25044}  Patient's preferred pharmacy is:  [facility] [place], [place] [address] AT [address] [place] [address] Phone: [phone] Fax: [address], [address] [place] AT [facility] [place] [address] Phone: [phone] Fax: [phone]  Uses pill box? {Yes or If no, why not?:20788} Pt endorses ***% compliance  Follow Up:  {FOLLOWUP:24991}  Plan: {CM FOLLOW UP RXVQ:00867}  SIG***   Social Hx: Born and raised in [place]. Married 57 years. Had one daughter who passed away. Also had a grandson and he passed away. Does have a great grandson (63 yrs old; lives in [place]). No pets.   Hypertension    BP goal is:  <140/90   Office blood pressures are     BP Readings from Last 3 Encounters:   02/26/20 (!) 114/51  02/01/20 120/80  01/21/20 132/72    Patient checks BP at home infrequently (only has wrist cuff) Patient home BP readings are ranging: 130/60 this morning. States it has been normal when PT comes in and checks it.   Patient has failed these meds in the past: quinapril (listed in D/C meds. No apparent reason for D/C) Patient is currently controlled on the following medications: Amlodipine 48m daily Losartan 1016mdaily Metoprolol Succinate 2565maily (started at 01/21/20 cardio visit)   Fill Dates Per Dispense Report - Filled Appropriately Amlodipine 10/28/19 - 90 DS 07/28/19 - 90 DS   Losartan 11/01/19 - 90 DS 07/28/19 - 90 DS   We discussed BP goals    Update  01/10/20 Had a therapist coming to her home twice a week and was told her BP was always good.   Update 02/21/20 Started metoprolol. Hasn't been checking BP. States she isn't aware of the results of her monitor. Reviewed results notated in chart. Denies chest pain, headaches, and dizziness. Recommended patient to get arm BP cuff to assure no s/sx of hypotension   Update 03/31/20 Get arm BP cuff? No, feels her wrist cuff is accurate (checked today and it was 133/66)     Plan -Continue current medications    AFIB    CMP Latest Ref Rng & Units 01/03/2020 11/12/2019 11/11/2019  Glucose 65 - 99 mg/dL 96 96 95  BUN 7 - 25 mg/dL 26(H) 38(H) 37(H)  Creatinine 0.60 - 0.88 mg/dL 2.09(H) 1.79(H) 1.87(H)  Sodium 135 - 146 mmol/L 137 131(L) 132(L)  Potassium 3.5 - 5.3 mmol/L 4.7 5.0 4.9  Chloride 98 - 110 mmol/L 103 99 101  CO2 20 - 32 mmol/L 16(L) 22 22  Calcium 8.6 - 10.4 mg/dL 9.6 8.6(L) 8.7(L)  Total Protein 6.1 - 8.1 g/dL 6.9 - -  Total Bilirubin 0.2 - 1.2 mg/dL 0.5 - -  Alkaline Phos 39 - 117 U/L - - -  AST 10 - 35 U/L 12 - -  ALT 6 - 29 U/L 7 - -    Patient has failed these meds in past: None noted Patient is currently controlled on the following medications: Eliquis 89m twice daily Metoprolol  Succinate 244mdaily (started at 01/21/20 cardio visit)   Cost $133/month   For indication of PE treatment patient is on appropriate dose (No dose adjustment recommended; 79m51mID). For indication of Afib, recommended dose would be 2.79mg59mD noting patient age >80,>5d Scr >1.5   We discussed:  Dosing of Eliquis based on indications    Update 01/10/20 Scheduled follow up with cardiology? Yes on 01/21/20   Update 02/21/20 She doesn't feel she would qualify for PAP due to income.    Update 03/31/20 Noted cardio plan to continue Eliquis 79mg 19m until 05/10/20 then reduce to 2.79mg. 879mlan -Continue current medications      Heart Failure    Type: Combined Systolic and Diastolic   Last ejection fraction: 11/09/19 45-50% NYHA Class: I (no actitivty limitation) AHA HF Stage: B (Heart disease present - no symptoms present)   Patient has failed these meds in past: None noted Patient is currently controlled on the following medications: Furosemide 20mg d74m   Reports she has bilateral edema at baseline. Legs are worse when she has her legs hanging down. Denies SOB. Does not weigh daily. Acknowledges she was instructed to weigh daily. Reinforced daily weights   We discussed weighing daily; if you gain more than 3 pounds in one day or 5 pounds in one week call your doctor    Update 01/10/20 Weighing daily? No, weighed when therapy came into the home. Feels her weight was stable   Update 02/21/20 Swelling is still at baseline Denies SOB Weighed 177.5    Update 03/31/20 No change in swelling or weight.   Plan -Continue current medications  -Weigh self daily   Neuropathy    Followed by Neuropathy (Sarah Butler [place]   Vitamin B12 - 01/03/20 - 429   Patient has failed these meds in past: pregabalin (ineffective), gabapentin (ineffective), duloxetine (listed in allergies as did not feel well) Patient is currently uncontrolled on the following medications: Lyrica 100mg da3mat  bedtime Lyrica 279mg dai4mt  bedtime Nortriptyline 57m daily at bedtime Alpha Lipoic Acid 209mdaily   Is getting home PT. She feels it is helping. States she takes 2567myrica in the morning and 100m36m bedtime Has taken lyrica for years. Nortriptyline is more recent. States Lyrica is expensive $68 25mg32m0 100mg 46m discussed:  how she is deficient and B12 and this could be a contributing factor to her neuropathy. Recommended patient to start B12 1000mcg 73my and get B12 rechecked at visit w/ Dr. Lowne oEtter Sjogren0/21    Update 01/10/20 Started B12 after last visit. Feet are not feeling any better. Going to neurologist on 02/20/20. She gets up 2-3 times per night to urinate. Could consider titrating nortriptyline to 25mg da73m but this might be limited due to her feelings of drowsiness throughout the day. She would actually like to taper off of nortryptyline. We discussed use of alpha lipoic acid. Alpha lipoic acid reported to be possibly effective for neuropathy at doses of 600-1800mg dai64mn one to 3 divided doses. Effects not seen for 3-5 weeks.   Considerations for a Plan -Stop nortriptyline, start duloxetine (pt open to this option possibly) AND/OR -Start alpha lipoic acid 600mg dail83mr at least 5 weeks (no drug interactions noted)   Update 02/21/20 Saw neurologist? No, cancelled appt until 11/23 She tried alpha lipoic acid, and she felt it did seem to help, but got scared, so she plans to wait until neurology appt   Update 03/31/20 States she started alpha lipoic acid. She has the 200mg capsu25mand takes one daily and feels this helps. Noted the upper limits of alpha lipoic acid, but patient states she would like to remain on lower dose due to pill burden   Plan -Continue current medications      Vaccines    Reviewed and discussed patient's vaccination history.         Immunization History  Administered Date(s) Administered   Fluad Quad(high Dose 65+)  12/29/2018, 01/03/2020   Influenza Split 01/01/2012   Influenza Whole 01/03/2008, 12/30/2009   Influenza, High Dose Seasonal PF 01/04/2013, 01/08/2014, 12/22/2017   Influenza-Unspecified 12/19/2014, 12/25/2015, 01/11/2017   PFIZER SARS-COV-2 Vaccination 05/14/2019, 06/08/2019, 02/01/2020   Pneumococcal Conjugate-13 02/11/2014   Pneumococcal Polysaccharide-23 12/30/2009, 01/01/2012   Tdap 03/15/2011   Zoster 04/30/2013    Update 01/10/20 Patient received flu vaccine at 01/03/20 visit w/ Dr. Lowne   UpdEtter Sjogren2/27/21 Shingrix? No   Plan -Recommended patient receive Shingrix vaccine in pharmacy.   Medication Management    [facility] Uses pill box? Yes has an AM and PM box Pt endorses 100% compliance   We discussed: Discussed benefits of medication synchronization, packaging and delivery as well as enhanced pharmacist oversight with [employer]. Patient would like to think about it.   Miscellaneous Meds Vitamin C 1000mg Systan25me drop   Meds to D/C from list Aspirin 81mg (should29mD/C per message from 11/16/19)   Plan   Continue current medication management strategy

## 2020-10-02 DIAGNOSIS — Z961 Presence of intraocular lens: Secondary | ICD-10-CM | POA: Diagnosis not present

## 2020-10-02 DIAGNOSIS — H5202 Hypermetropia, left eye: Secondary | ICD-10-CM | POA: Diagnosis not present

## 2020-10-02 DIAGNOSIS — H53001 Unspecified amblyopia, right eye: Secondary | ICD-10-CM | POA: Diagnosis not present

## 2020-10-02 DIAGNOSIS — H31002 Unspecified chorioretinal scars, left eye: Secondary | ICD-10-CM | POA: Diagnosis not present

## 2020-10-02 NOTE — Patient Instructions (Signed)
Krystal Henderson,  It was a pleasure speaking with you today.  Below is information regarding your health goals.   I also would recommend a trial of taking amlodipine (blood pressure medication) at night to see if change might help with daytime fatigue / drowsiness you are experiencing.  Please feel free to contact me if you have any questions or concerns.  Keep up the good work!  Cherre Robins, PharmD Clinical Pharmacist [facility] [phone]  Visit Information  PATIENT GOALS:  Goals Addressed             This Visit's Progress    Chronic Care Management Pharmacy Care Plan   On track    CARE PLAN ENTRY (see longitudinal plan of care for additional care plan information)  Current Barriers:  Chronic Disease Management support, education, and care coordination needs related to Hypertension, Hyperlipidemia, Pre-DM, AFib, Heart Failure, Neuropathy, Anxiety, Back Pain, Osteoporosis   Hypertension BP Readings from Last 3 Encounters:  09/09/20 123/67  08/07/20 132/62  05/05/20 (!) 148/68  Pharmacist Clinical Goal(s): Over the next 90days, patient will work with PharmD and providers to maintain BP goal <140/90 Current regimen:  Amlodipine 5mg  daily  Losartan 100mg  daily Metoprolol Succinate 25mg  daily Interventions: Discussed blood pressure goals Patient self care activities - Over the next 90 days, patient will: Trial of taking amlodipine at night to see if helps with daytime drowsiness / fatigue Recommend check blood pressure 2 to 3 times per week and record  Hyperlipidemia Lab Results  Component Value Date/Time   LDLCALC 110 (H) 08/07/2020 03:21 PM   LDLCALC 109 (H) 01/03/2020 04:05 PM   LDLDIRECT 129.0 10/18/2014 11:56 AM  Pharmacist Clinical Goal(s): Over the next 90 days, patient will work with PharmD and providers to achieve LDL goal < 100 Current regimen:  Diet management   Interventions: Discussed LDL goal Discussed  diet Patient self care activities - Over the next 90 days, patient will: Limit intake of saturated and trans fat  Atrial Fibrillation / history of pulmonary embolism Pharmacist Clinical Goal(s) Over the next 90 days, patient will work with PharmD and providers to reduce risk of stroke and symptoms related to AFib Current regimen:  Eliquis 2.5mg  twice daily Metoprolol Succinate 25mg  daily Interventions: Screened for Patient Assistance Program for Eliquis (household income did not qualify) Patient self care activities - Over the next 90 days, patient will: Maintain current medications for atrial fibrillation  Heart Failure Pharmacist Clinical Goal(s) Over the next 90 days, patient will work with PharmD and providers to reduce symptoms associated with heart failure Current regimen:  Furosemide 20mg  daily Metoprolol Succinate 25mg  daily Interventions: Encouraged patient to weigh daily Educated patient to notify office if she gains more than 3lbs in one day or 5lbs in one week Patient self care activities - Over the next 90 days, patient will: Weigh daily and monitor for increased swelling or shortness of breath Notify office if she gains more than 3lbs in one day or 5lbs in one week  Neuropathy Pharmacist Clinical Goal(s) Over the next 90 days, patient will work with PharmD and providers to reduce symptoms associated with neuropathy Current regimen:  Lyrica 100mg  daily at bedtime Nortriptyline 10mg  daily Patient self care activities - Over the next 90 days, patient will: Continue to follow up with Dr Jannifer Franklin  Osteoporosis Pharmacist Clinical Goal(s) Over the next 90 days, patient will work with PharmD and providers to reduce risk of fracture associated with osteoporosis Current regimen:  Calcium Carbonate 600mg   - take 1 tablet twice a day  Vitamin D 2000 units once daily Interventions Consider repeat DEXA Scan Patient self care activities - Over the next 90 days, patient  will: Consider repeat DEXA Scan Fall prevention  Health Maintenance  Pharmacist Clinical Goal(s) Over the next 180 days, patient will work with PharmD and providers to complete health maintenance screenings/vaccinations Interventions: Discussed Shingrix Vaccine Series Patient self care activities - Over the next 180 days, patient will: Complete Shingrix vaccine series Consider 2nd Covid Booster  Medication management Pharmacist Clinical Goal(s): Over the next 90 days, patient will work with PharmD and providers to maintain optimal medication adherence [facility] Interventions Comprehensive medication review performed. Continue current medication management strategy Patient self care activities - Over the next 90 days, patient will: Focus on medication adherence by filling and taking medications appropriately  Take medications as prescribed Report any questions or concerns to PharmD and/or provider(s)  Please see past updates related to this goal by clicking on the "Past Updates" button in the selected goal           The patient verbalized understanding of instructions, educational materials, and care plan provided today and agreed to receive a mailed copy of patient instructions, educational materials, and care plan.   The care management team will reach out to the patient again over the next 60 days.  Clinical pharmacist will check back in 3 to 4 months

## 2020-10-02 NOTE — Chronic Care Management (AMB) (Signed)
Chronic Care Management Pharmacy Note  10/02/2020 Name:  Krystal Henderson MRN:  [id number] DOB:  Aug 13, 1932  Subjective: Krystal Henderson is an 87 y.o. year old female who is a primary patient of Ann Held, DO.  The CCM team was consulted for assistance with disease management and care coordination needs.    Engaged with patient by telephone for follow up visit in response to provider referral for pharmacy case management and/or care coordination services.   Consent to Services:  The patient was given information about Chronic Care Management services, agreed to services, and gave verbal consent prior to initiation of services.  Please see initial visit note for detailed documentation.   Patient Care Team: Carollee Herter, Alferd Apa, DO as PCP - General (Family Medicine) Edrick Oh, MD as Consulting Physician (Nephrology) Kathrynn Ducking, MD as Consulting Physician (Neurology) Renard Hamper Franchot Gallo, PA-C as Counselor (Dermatology) Cherre Robins, PharmD (Pharmacist)  Recent office visits: 08/07/2020 - PCP (Dr Etter Sjogren) Soreness and Swelling in right lower extremity; No med changes; Recommended using compression hose.  Recent consult visits: 08/07/2020 - PCP (Dr Etter Sjogren) Soreness and Swelling in right lower extremity; No med changes; Recommended using compression hose.  Hospital visits: None in previous 6 months  Objective:  Lab Results  Component Value Date   CREATININE 2.12 (H) 08/07/2020   CREATININE 2.09 (H) 01/03/2020   CREATININE 1.79 (H) 11/12/2019    Lab Results  Component Value Date   HGBA1C 5.7 (H) 11/09/2019   Last diabetic Eye exam: No results found for: HMDIABEYEEXA  Last diabetic Foot exam: No results found for: HMDIABFOOTEX      Component Value Date/Time   CHOL 200 08/07/2020 1521   TRIG 75.0 08/07/2020 1521   HDL 74.30 08/07/2020 1521   CHOLHDL 3 08/07/2020 1521   VLDL 15.0 08/07/2020 1521   LDLCALC 110 (H) 08/07/2020 1521   LDLCALC 109 (H)  01/03/2020 1605   LDLDIRECT 129.0 10/18/2014 1156    Hepatic Function Latest Ref Rng & Units 08/07/2020 01/03/2020 07/02/2019  Total Protein 6.0 - 8.3 g/dL 6.8 6.9 6.5  Albumin 3.5 - 5.2 g/dL 3.9 - 3.9  AST 0 - 37 U/L _0 ALT 0 - 35 U/L _1 Alk Phosphatase 39 - 117 U/L 66 - 53  Total Bilirubin 0.2 - 1.2 mg/dL 0.5 0.5 0.4  Bilirubin, Direct 0.0 - 0.3 mg/dL - - -    Lab Results  Component Value Date/Time   TSH 2.432 11/09/2019 01:50 AM   TSH 3.53 11/26/2013 09:03 AM   TSH 3.28 03/27/2013 11:26 AM   FREET4 1.1 10/02/2008 11:33 AM   FREET4 1.0 08/17/2007 11:47 AM    CBC Latest Ref Rng & Units 08/07/2020 11/12/2019 11/11/2019  WBC 4.0 - 10.5 K/uL 6.5 6.8 7.1  Hemoglobin 12.0 - 15.0 g/dL 10.9(L) 11.4(L) 11.6(L)  Hematocrit 36.0 - 46.0 % 34.4(L) 35.1(L) 35.5(L)  Platelets 150.0 - 400.0 K/uL 309.0 325 307    Lab Results  Component Value Date/Time   VD25OH 63.76 08/07/2020 03:21 PM   VD25OH 58 12/23/2011 02:48 PM   VD25OH 60 03/15/2011 10:39 AM    Clinical ASCVD: No  The ASCVD Risk score Mikey Bussing DC Jr., et al., 2013) failed to calculate for the following reasons:   The 2013 ASCVD risk score is only valid for ages 13 to 71    Other: CHADS2VASc = 4 See care plan below for DEXA  Social History   Tobacco Use  Smoking Status Never  Smokeless Tobacco Never   BP Readings from Last 3 Encounters:  09/09/20 123/67  08/07/20 132/62  05/05/20 (!) 148/68   Pulse Readings from Last 3 Encounters:  09/09/20 68  08/07/20 67  05/05/20 67   Wt Readings from Last 3 Encounters:  09/09/20 174 lb 6.4 oz (79.1 kg)  08/07/20 174 lb 9.6 oz (79.2 kg)  05/05/20 181 lb (82.1 kg)    Assessment: Review of patient past medical history, allergies, medications, health status, including review of consultants reports, laboratory and other test data, was performed as part of comprehensive evaluation and provision of chronic care management services.   SDOH:  (Social Determinants of Health)  assessments and interventions performed:  SDOH Interventions    Flowsheet Row Most Recent Value  SDOH Interventions   Financial Strain Interventions Intervention Not Indicated  [Screened for patient assistance for Eliquis but household income dose not qualify]  Physical Activity Interventions Intervention Not Indicated, Other (Comments)  [limited mobility]       CCM Care Plan  Allergies  Allergen Reactions   Codeine Nausea Only   Duloxetine Other (See Comments)    REACTION: intolerance--She does not remember taking this Rx- states she did not feel well    Sertraline Hcl Other (See Comments)    REACTION: intolerance-- Did not help with Depression    Medications Reviewed Today     Reviewed by Cherre Robins, PharmD (Pharmacist) on 09/29/20 at 1424  Med List Status: <None>   Medication Order Taking? Sig Documenting Provider Last Dose Status Informant  ALPRAZolam (XANAX) 0.25 MG tablet [id number] Yes Take 2-4 tablets (0.5-1 mg total) by mouth at bedtime as needed. Anxiety [name] R, DO Taking Active   amLODipine (NORVASC) 5 MG tablet [id number] Yes Take 1 tablet (5 mg total) by mouth daily. Ann Held, DO Taking Active   apixaban (ELIQUIS) 2.5 MG TABS tablet [id number] Yes Take 1 tablet (2.5 mg total) by mouth 2 (two) times daily. Constance Haw, MD Taking Active   Ascorbic Acid (VITAMIN C) 1000 MG tablet [id number] Yes Take 1,000 mg by mouth daily. [provider] Taking Active Self  Calcium Carbonate (CALCIUM 600 PO) [id number] Yes Take 1 capsule by mouth daily. [provider] Taking Active Self  Cholecalciferol (VITAMIN D) 2000 UNITS CAPS [id number] Yes Take 1 capsule by mouth daily. [provider] Taking Active Self  furosemide (LASIX) 20 MG tablet [id number] Yes Take 1 tablet (20 mg total) by mouth daily. Ann Held, DO Taking Active   losartan (COZAAR) 100 MG tablet [id number] Yes Take 1 tablet (100 mg total) by mouth daily.  Ann Held, DO Taking Active   LYRICA 100 MG capsule [id number] Yes Take 1 capsule (100 mg total) by mouth at bedtime. Kathrynn Ducking, MD Taking Active   metoprolol succinate (TOPROL-XL) 25 MG 24 hr tablet [id number] Yes Take 1 tablet (25 mg total) by mouth daily. Take with or immediately following a meal. Camnitz, Will Hassell Done, MD Taking Active   nortriptyline (PAMELOR) 10 MG capsule [id number] Yes Take 1 capsule (10 mg total) by mouth at bedtime. Kathrynn Ducking, MD Taking Active   Polyethyl Glycol-Propyl Glycol (SYSTANE ULTRA OP) [id number] Yes Apply 1 drop to eye daily as needed (for dry eyes). [provider] Taking Active Self            Patient Active Problem List   Diagnosis Date Noted   Venous stasis dermatitis of right lower  extremity 08/07/2020   Insomnia 08/07/2020   Acute on chronic combined systolic and diastolic CHF (congestive heart failure) ([place]) 11/12/2019   AF (paroxysmal atrial fibrillation) ([place]) 11/10/2019   Impaired mobility and ADLs 11/10/2019   Acute respiratory failure with hypoxia ([place]) 11/08/2019   Acute pulmonary embolism ([place]) 11/08/2019   Impacted cerumen of left ear 01/03/2019   Lesion of skin of scalp 12/30/2018   Edema 06/22/2018   Iron deficiency anemia 04/02/2018   Lumbar compression fracture ([place]) 11/26/2015   Lower extremity edema 10/23/2015   Chronic low back pain 06/21/2014   Pain in joint, shoulder region 02/11/2014   Unspecified vitamin D deficiency 03/30/2013   CKD (chronic kidney disease), stage IV ([place]) 03/28/2013   Acute kidney injury ([place]) 01/01/2012   Spinal stenosis 12/23/2011   Osteopenia 12/23/2011   Renal insufficiency, mild 08/24/2011   Vitamin B 12 deficiency 09/24/2010   IBS (irritable bowel syndrome) 09/24/2010   Peripheral neuropathy, idiopathic 09/24/2010   GERD 08/12/2009   DIVERTICULOSIS, COLON 09/08/2007   Angiodysplasia of intestine with hemorrhage 09/08/2007   ARTHRITIS 09/08/2007   COLONIC  POLYPS, ADENOMATOUS, HX OF 09/08/2007   HYPERLIPIDEMIA 01/23/2007   Primary hypertension 01/23/2007   Osteoporosis 01/23/2007   HYPOGLYCEMIA, REACTIVE 06/30/2006    Immunization History  Administered Date(s) Administered   Fluad Quad(high Dose 65+) 12/29/2018, 01/03/2020   Influenza Split 01/01/2012   Influenza Whole 01/03/2008, 12/30/2009   Influenza, High Dose Seasonal PF 01/04/2013, 01/08/2014, 12/22/2017   Influenza-Unspecified 12/19/2014, 12/25/2015, 01/11/2017   PFIZER(Purple Top)SARS-COV-2 Vaccination 05/14/2019, 06/08/2019, 02/01/2020   Pneumococcal Conjugate-13 02/11/2014   Pneumococcal Polysaccharide-23 12/30/2009, 01/01/2012   Tdap 03/15/2011   Zoster, Live 04/30/2013    Conditions to be addressed/monitored: Atrial Fibrillation, HTN, HLD, CKD Stage 4, and osteoporosis; insomnia; CHF; GERD  Care Plan : General Pharmacy (Adult)  Updates made by Cherre Robins, PHARMD since 10/02/2020 12:00 AM     Problem: HTN; HDL; afib; CHF; CKD-4; osteoporosis; insomnia; neuropathy; impaired mobility and ADLS   Priority: High     Long-Range Goal: [place] goal for chronic care and medication mangements   Start Date: 09/29/2020  Priority: High  Note:   Current Barriers:  Unable to achieve control of hyperlipidemia  Does not maintain contact with provider office  Pharmacist Clinical Goal(s):  Over the next 90 days, patient will achieve control of hyperlipidemia as evidenced by LDL <100 maintain control of BP  as evidenced by BP <140/90  contact provider office for questions/concerns as evidenced notation of same in electronic health record Follow up with Dr Justin Mend / nephrologist  through collaboration with PharmD and provider.   Interventions: 1:1 collaboration with Carollee Herter, Alferd Apa, DO regarding development and update of comprehensive plan of care as evidenced by provider attestation and co-signature Inter-disciplinary care team collaboration (see longitudinal plan  of care) Comprehensive medication review performed; medication list updated in electronic medical record  Hypertension Controlled; Recent office BP has been at goal; BP goal <140/90 Current regimen:  Amlodipine 62m daily  Losartan 1070mdaily Metoprolol Succinate 2566maily Denies dizziness or lightheadedness. Patient states she is fatigued during daytime.  Interventions: Discussed blood pressure goals Suggested trial of taking amlodipine at night to see if this helps with daytime drowsiness but not likely cause.   Hyperlipidemia Not at goal; LDL goal < 100 Patient not able to exercise due to limited mobility Current regimen:  Diet management   Interventions: Discussed LDL goal Discussed diet; Limit intake of saturated and trans fat  Atrial  Fibrillation / history of pulmonary embolism Goal: reduce risk of stroke and recurrent PE Currently in Medicare coverage gap and Eliquis cost is >$100 per month Last Scr = 2.12 and est CrCl = 24 mL/min; Wt = 79.1kg and age 72yo. Dose of Eliquis is appropriately lowered.  Current regimen:  Eliquis 2.16m twice daily Metoprolol Succinate 273mdaily Interventions: Screened for Patient Assistance Program for Eliquis (household income did not qualify) Maintain current medications for atrial fibrillation and stroke / PE prevention  Heart Failure Goal: reduce exacerbations / hospitalization and minimize symptoms of CHF HFmrEF - last EF was 45-50% (11/09/2019) Current regimen:  Furosemide 2077maily Metoprolol Succinate 9m67mily Due to CrCl <25 not candidate at this times for SGLT2 theapry. Also has history of hypoglycemia but this was several years ago and per patient she cannot recall.  Interventions: Encouraged patient to weigh daily Educated patient to notify office if she gains more than 3lbs in one day or 5lbs in one week  Neuropathy Goal: reduce symptoms associated with neuropathy Reviewed renal dosing for Lyrica / pregabalin Last  est CrCL = 24mL59m Dosing recommendations for Cr CL 15-30 mL/min for Lyrica is 100 to 150 mg per day Current regimen:  Lyrica 100mg 62my at bedtime Nortriptyline 10mg d69m Interventions:  Continue to follow up with Dr Willis Jannifer Franklinue current therapy  Osteoporosis Goal:  reduce risk of fracture associated with osteoporosis Last vitamin D levels was 63 (08/07/2020) Last DEXA 06/08/2016 Left Forearm Radius 33% T-score =  -3.0.  Lumbar spine was not utilized due to surgical changes and compression fracture. Right femur was not scanned due to the patient having a right hip replacement. Left Femur Neck T-Score =  -1.8  Current regimen:  Calcium Carbonate 600mg  -55me 1 tablet daily  Vitamin D 2000 units once daily Took Alendronate for 5 years Current low renal function prohibits using bisphosphonate therapy  Prolia has been in past but patient declined Interventions Consider repeat DEXA Scan Discussed Prolia therapy - still declines Recommended calcium 1200mg dai75mrom supplements and diet Continue vitamin D 2000 units daily Fall prevention  Health Maintenance  Interventions: Reviewed vaccine records Complete Shingrix vaccine series Consider 2nd Covid Booster  Medication management Current pharmacy: [facility] Interventions Comprehensive medication review performed. Continue current medication management strategy  Patient Goals/Self-Care Activities Over the next 90days, patient will:  take medications as prescribed, weigh daily, and contact provider if weight gain of 3lbs in 24 hours or 5lbs in 1 week, and follow up with nephrologist.   Follow Up Plan:  Clinical pharmacist in 3 to 4 months; CMA will check BP / daytime drowsiness in 2 months.          Medication Assistance:  assessed for Eliquis PAP since patient is in medicare coverage gap but income does not meet program requirements.   [id number] [place] [phone] [address] [phone]:  Patient agrees to Care Plan and Follow-up.  Plan: The care management team will reach out to the patient again over the next 60 days.  Clinical pharmacist f/u in 3 to 4 months.    Loukas Antonson EckCherre RobinsClinical Pharmacist [place] P[facility] Heightsr[phone]

## 2020-10-20 ENCOUNTER — Other Ambulatory Visit: Payer: Self-pay | Admitting: Cardiology

## 2020-11-19 ENCOUNTER — Other Ambulatory Visit: Payer: Self-pay | Admitting: Cardiology

## 2020-11-19 NOTE — Telephone Encounter (Signed)
Pt last saw Dr Curt Bears 05/05/20, last labs 08/07/20 Creat 2.12, age 87, weight 79.1kg, based on specified criteria pt is on appropriate dosage of Eliquis 2.5mg  BID.  Will refill rx.

## 2020-11-24 ENCOUNTER — Other Ambulatory Visit: Payer: Self-pay

## 2020-11-24 ENCOUNTER — Encounter: Payer: Self-pay | Admitting: Cardiology

## 2020-11-24 ENCOUNTER — Ambulatory Visit: Payer: Medicare Other | Admitting: Cardiology

## 2020-11-24 VITALS — BP 140/84 | HR 67 | Ht 65.0 in | Wt 174.8 lb

## 2020-11-24 DIAGNOSIS — I480 Paroxysmal atrial fibrillation: Secondary | ICD-10-CM | POA: Diagnosis not present

## 2020-11-24 MED ORDER — DILTIAZEM HCL ER COATED BEADS 120 MG PO CP24: 120.0000 mg | ORAL_CAPSULE | Freq: Every day | ORAL | 6 refills | Status: DC

## 2020-11-24 NOTE — Progress Notes (Signed)
Electrophysiology Office Note   Date:  11/24/2020   ID:  Krystal Henderson, DOB 05-Aug-1932, MRN [id number]  PCP:  Carollee Herter, Alferd Apa, DO  Cardiologist:   Primary Electrophysiologist:  Maxwell Lemen Meredith Leeds, MD    Chief Complaint: Atrial fibrillation   History of Present Illness: Krystal Henderson is a 87 y.o. female who is being seen today for the evaluation of atrial fibrillation at the request of Ann Held, *. Presenting today for electrophysiology evaluation.  She has a history significant for hypertension and hyperlipidemia.  She was seen by her primary physician was noted to have atrial fibrillation.  She was already on Eliquis due to PE in the past.  She was hospitalized 11/08/2019 with shortness of breath and was found to have a pulmonary embolism.  On 11/10/2019, she went into atrial fibrillation was started on metoprolol.  Today, denies symptoms of palpitations, chest pain, shortness of breath, orthopnea, PND, lower extremity edema, claudication, dizziness, presyncope, syncope, bleeding, or neurologic sequela. The patient is tolerating medications without difficulties.  Her main complaint today is fatigue.  She states that she sleeps a lot today.  She is unclear as to what is causing this.  She has been told that it could be partly due to her medications.  Aside from that, she has no complaints of of atrial fibrillation.   Past Medical History:  Diagnosis Date   Anemia    hx of    Angiodysplasia 2007   @ colonoscopy   Anxiety    PMH of   Chronic low back pain 06/21/2014   Diverticulosis of colon (without mention of hemorrhage)    DJD (degenerative joint disease)    Esophageal reflux    inactive   History of vertebral fracture 11/2015   HTN (hypertension)    Hyperlipemia 2006   LDL 130   Hypoglycemia    reactive   Pelvic fracture ([place]) 12/30/2011   [facility]   Peripheral neuropathy    Personal history of colonic polyps    adenomatous   Vitamin B12  deficiency    Past Surgical History:  Procedure Laterality Date   cataract surgery  12-26-12 and 01-09-13   COLONOSCOPY  2011   neg   COLONOSCOPY W/ POLYPECTOMY     X 2 , Dr  Verl Blalock; angiodysplasia. Due 2022   DILATION AND CURETTAGE OF UTERUS     FACIAL COSMETIC SURGERY     HEMORROIDECTOMY     LUMBAR LAMINECTOMY/DECOMPRESSION MICRODISCECTOMY  09/10/2011   Procedure: LUMBAR LAMINECTOMY/DECOMPRESSION MICRODISCECTOMY;  Surgeon: Johnn Hai, MD;  Location: [facility];  Service: Orthopedics;  Laterality: N/A;  decompression laminectomy L2-3, L3-4, L4-5   ORIF WRIST FRACTURE  01/11/2012   Procedure: OPEN REDUCTION INTERNAL FIXATION (ORIF) WRIST FRACTURE;  Surgeon: Tennis Must, MD;  Location: [facility];  Service: Orthopedics;  Laterality: Right;   TEAR DUCT PROBING     X 2   TOTAL HIP ARTHROPLASTY  2000   right   TUBAL LIGATION       Current Outpatient Medications  Medication Sig Dispense Refill   ALPRAZolam (XANAX) 0.25 MG tablet Take 2-4 tablets (0.5-1 mg total) by mouth at bedtime as needed. Anxiety 30 tablet 1   amLODipine (NORVASC) 5 MG tablet Take 1 tablet (5 mg total) by mouth daily. 90 tablet 1   Ascorbic Acid (VITAMIN C) 1000 MG tablet Take 1,000 mg by mouth daily.     Calcium Carbonate (CALCIUM 600 PO) Take 1  capsule by mouth daily.     Cholecalciferol (VITAMIN D) 2000 UNITS CAPS Take 1 capsule by mouth daily.     ELIQUIS 2.5 MG TABS tablet TAKE 1 TABLET(2.5 MG) BY MOUTH TWICE DAILY 60 tablet 5   furosemide (LASIX) 20 MG tablet Take 1 tablet (20 mg total) by mouth daily. 90 tablet 1   losartan (COZAAR) 100 MG tablet Take 1 tablet (100 mg total) by mouth daily. 90 tablet 1   LYRICA 100 MG capsule Take 1 capsule (100 mg total) by mouth at bedtime. 90 capsule 1   metoprolol succinate (TOPROL-XL) 25 MG 24 hr tablet TAKE 1 TABLET(25 MG) BY MOUTH DAILY WITH OR IMMEDIATELY FOLLOWING A MEAL 90 tablet 1   nortriptyline (PAMELOR) 10 MG capsule Take 1 capsule (10 mg  total) by mouth at bedtime. 90 capsule 3   Polyethyl Glycol-Propyl Glycol (SYSTANE ULTRA OP) Apply 1 drop to eye daily as needed (for dry eyes).     No current facility-administered medications for this visit.    Allergies:   Codeine, Duloxetine, and Sertraline hcl   Social History:  The patient  reports that she has never smoked. She has never used smokeless tobacco. She reports current alcohol use of about 3.0 standard drinks per week. She reports that she does not use drugs.   Family History:  The patient's family history includes Cancer in her brother; Depression in her maternal uncle; Heart attack (age of onset: 82) in her brother; Heart attack (age of onset: 63) in her father; Kidney disease in her mother; Osteoporosis in her sister; Peripheral vascular disease in her daughter; Stroke (age of onset: 67) in her brother; Throat cancer in her sister.   ROS:  Please see the history of present illness.   Otherwise, review of systems is positive for none.   All other systems are reviewed and negative.   PHYSICAL EXAM: VS:  BP 140/84   Pulse 67   Ht 5\' 5"  (1.651 m)   Wt 174 lb 12.8 oz (79.3 kg)   SpO2 97%   BMI 29.09 kg/m  , BMI Body mass index is 29.09 kg/m. GEN: Well nourished, well developed, in no acute distress  HEENT: normal  Neck: no JVD, carotid bruits, or masses Cardiac: RRR; no murmurs, rubs, or gallops,no edema  Respiratory:  clear to auscultation bilaterally, normal work of breathing GI: soft, nontender, nondistended, + BS MS: no deformity or atrophy  Skin: warm and dry Neuro:  Strength and sensation are intact Psych: euthymic mood, full affect  EKG:  EKG is ordered today. Personal review of the ekg ordered shows sinus rhythm, PACs   Recent Labs: 08/07/2020: ALT 8; BUN 26; Creatinine, Ser 2.12; Hemoglobin 10.9; Platelets 309.0; Potassium 4.9; Sodium 139    Lipid Panel     Component Value Date/Time   CHOL 200 08/07/2020 1521   TRIG 75.0 08/07/2020 1521   HDL  74.30 08/07/2020 1521   CHOLHDL 3 08/07/2020 1521   VLDL 15.0 08/07/2020 1521   LDLCALC 110 (H) 08/07/2020 1521   LDLCALC 109 (H) 01/03/2020 1605   LDLDIRECT 129.0 10/18/2014 1156     Wt Readings from Last 3 Encounters:  11/24/20 174 lb 12.8 oz (79.3 kg)  09/09/20 174 lb 6.4 oz (79.1 kg)  08/07/20 174 lb 9.6 oz (79.2 kg)      Other studies Reviewed: Additional studies/ records that were reviewed today include: TTE 11/09/19  Review of the above records today demonstrates:   1. Mild global reduction in  LV systolic function; grade 1 diastolic  dysfunction; right heart not well visualized.   2. Left ventricular ejection fraction, by estimation, is 45 to 50%. The  left ventricle has mildly decreased function. The left ventricle  demonstrates global hypokinesis. Left ventricular diastolic parameters are  consistent with Grade I diastolic  dysfunction (impaired relaxation). Elevated left atrial pressure.   3. Right ventricular systolic function is normal. The right ventricular  size is normal. Tricuspid regurgitation signal is inadequate for assessing  PA pressure.   4. Left atrial size was moderately dilated.   5. The mitral valve is normal in structure. Mild mitral valve  regurgitation. No evidence of mitral stenosis.   6. The aortic valve is tricuspid. Aortic valve regurgitation is mild. No  aortic stenosis is present.   7. Aortic dilatation noted. There is mild dilatation of the aortic root  measuring 39 mm.   8. The inferior vena cava is normal in size with greater than 50%  respiratory variability, suggesting right atrial pressure of 3 mmHg.   Cardiac monitor 02/15/2020 personally reviewed Max 174 bpm 06:30am, 10/25 Min 36 bpm 11:57pm, 11/03 Avg 61 bpm <1% ventricular ectopy 12.3% supraventricular ectopy Predominant underlying rhythm was sinus rhythm 5% atrial fibrillation burden 28 beat run of NSVT No symptoms recorded  ASSESSMENT AND PLAN:  1.  Paroxysmal atrial  fibrillation: Currently on Eliquis.  CHA2DS2-VASc of 4.  Cardiac monitor showed a 5% atrial fibrillation burden.  She having some mild fatigue.  We Yoland Scherr stop Toprol-XL and start diltiazem 120 mg.  2.  Hypertension: Mildly elevated today but usually well controlled.  3.  Pulmonary embolism: Continue Eliquis  4.  Chronic systolic heart failure: Found on most recent echo.  Currently on Toprol-XL and losartan.  Due to fatigue, Ulyses Panico stop Toprol-XL.   Current medicines are reviewed at length with the patient today.   The patient does not have concerns regarding her medicines.  The following changes were made today: Stop Toprol-XL, start diltiazem  Labs/ tests ordered today include:  Orders Placed This Encounter  Procedures   EKG 12-Lead      Disposition:   FU with Juliette Standre 6 months  Signed, Poppy Mcafee Meredith Leeds, MD  11/24/2020 2:13 PM     [place] [address] [place] [address] [phone] (office) [phone] (fax)

## 2020-11-24 NOTE — Patient Instructions (Signed)
Medication Instructions:  Your physician has recommended you make the following change in your medication:  STOP Toprol 2.   START Diltiazem 120 mg once daily  *If you need a refill on your cardiac medications before your next appointment, please call your pharmacy*   Lab Work: None ordered   Testing/Procedures: None ordered   Follow-Up: At [facility], you and your health needs are our priority.  As part of our continuing mission to provide you with exceptional heart care, we have created designated Provider Care Teams.  These Care Teams include your primary Cardiologist (physician) and Advanced Practice Providers (APPs -  Physician Assistants and Nurse Practitioners) who all work together to provide you with the care you need, when you need it.  Your next appointment:   6 month(s)  The format for your next appointment:   In Person  Provider:   Tommye Standard, PA-C or Lollie Marrow, [place]    Thank you for choosing [facility]!!   Trinidad Curet, RN [phone]   Other Instructions   Diltiazem Tablets What is this medication? DILTIAZEM (dil TYE a zem) treats high blood pressure and prevents chest pain (angina). It works by relaxing the blood vessels, which helps decrease the amount of work your heart has to do. It belongs to a group of medicationscalled calcium channel blockers. This medicine may be used for other purposes; ask your health care provider orpharmacist if you have questions. COMMON BRAND NAME(S): Cardizem What should I tell my care team before I take this medication? They need to know if you have any of these conditions: Heart attack Heart disease Irregular heartbeat or rhythm Low blood pressure An unusual or allergic reaction to diltiazem, other medications, foods, dyes, or preservatives Pregnant or trying to get pregnant Breast-feeding How should I use this medication? Take this medication by mouth. Take it as directed on the  prescription label atthe same time every day. Keep taking it unless your care team tells you to stop. Talk to your care team about the use of this medication in children. Specialcare may be needed. Overdosage: If you think you have taken too much of this medicine contact apoison control center or emergency room at once. NOTE: This medicine is only for you. Do not share this medicine with others. What if I miss a dose? If you miss a dose, take it as soon as you can. If it is almost time for yournext dose, take only that dose. Do not take double or extra doses. What may interact with this medication? Do not take this medication with any of the following: Cisapride [place] Pimozide Ranolazine Red yeast rice This medication may also interact with the following: Buspirone Carbamazepine Cimetidine Cyclosporine Digoxin Local anesthetics or general anesthetics Lovastatin Medications for anxiety or difficulty sleeping like midazolam and triazolam Medications for high blood pressure or heart problems Quinidine Rifampin, rifabutin, or rifapentine This list may not describe all possible interactions. Give your health care provider a list of all the medicines, herbs, non-prescription drugs, or dietary supplements you use. Also tell them if you smoke, drink alcohol, or use illegaldrugs. Some items may interact with your medicine. What should I watch for while using this medication? Visit your care team for regular checks on your progress. Check your blood pressure as directed. Ask your care team what your blood pressure should be.Also, find out when you should contact them. Do not treat yourself for coughs, colds, or pain while you are using this medication without asking  your care team for advice. Some medications mayincrease your blood pressure. This medication may cause serious skin reactions. They can happen weeks to months after starting the medication. Contact your care team right away if you  notice fevers or flu-like symptoms with a rash. The rash may be red or purple and then turn into blisters or peeling of the skin. Or, you might notice a red rash with swelling of the face, lips or lymph nodes in your neck or under yourarms. You may get drowsy or dizzy. Do not drive, use machinery, or do anything that needs mental alertness until you know how this medication affects you. Do not stand up or sit up quickly, especially if you are an older patient. Thisreduces the risk of dizzy or fainting spells. What side effects may I notice from receiving this medication? Side effects that you should report to your care team as soon as possible: Allergic reactions-skin rash, itching, hives, swelling of the face, lips, tongue, or throat Heart failure-shortness of breath, swelling of the ankles, feet, or hands, sudden weight gain, unusual weakness or fatigue Slow heart beat-dizziness, feeling faint or lightheaded, trouble breathing, unusual weakness or fatigue Liver injury-right upper belly pain, loss of appetite, nausea, light-colored stool, dark yellow or brown urine, yellowing skin or eyes, unusual weakness or fatigue Low blood pressure-dizziness, feeling faint or lightheaded, blurry vision Redness, blistering, peeling, or loosening of the skin, including inside the mouth Side effects that usually do not require medical attention (report to your careteam if they continue or are bothersome): Constipation Facial flushing, redness Headache This list may not describe all possible side effects. Call your doctor for medical advice about side effects. You may report side effects to FDA at1-800-FDA-1088. Where should I keep my medication? Keep out of the reach of children and pets. Store at room temperature between 15 and 30 degrees C (59 and 86 degrees F). Protect from moisture. Keep the container tightly closed. Throw away any unusedmedication after the expiration date. NOTE: This sheet is a summary. It  may not cover all possible information. If you have questions about this medicine, talk to your doctor, pharmacist, orhealth care provider.  2022 Elsevier/Gold Standard (2020-02-07 15:46:34)

## 2020-12-22 ENCOUNTER — Other Ambulatory Visit: Payer: Self-pay

## 2020-12-22 MED ORDER — NORTRIPTYLINE HCL 10 MG PO CAPS: 10.0000 mg | ORAL_CAPSULE | Freq: Every day | ORAL | 3 refills | Status: DC

## 2020-12-27 ENCOUNTER — Ambulatory Visit (INDEPENDENT_AMBULATORY_CARE_PROVIDER_SITE_OTHER): Payer: Medicare Other

## 2020-12-27 VITALS — Ht 63.0 in | Wt 176.0 lb

## 2020-12-27 DIAGNOSIS — Z0000 Encounter for general adult medical examination without abnormal findings: Secondary | ICD-10-CM | POA: Diagnosis not present

## 2020-12-27 NOTE — Progress Notes (Signed)
Subjective:   Krystal Henderson is a 87 y.o. female who presents for Medicare Annual (Subsequent) preventive examination.  Virtual Visit via Telephone Note  I connected with  [name] on 12/27/20 at 12:40 PM EDT by telephone and verified that I am speaking with the correct person using two identifiers.  Location: Patient: Home Provider: [facility] Persons participating in the virtual visit: patient/her husband Larry/Nurse Health Advisor   I discussed the limitations, risks, security and privacy concerns of performing an evaluation and management service by telephone and the availability of in person appointments. The patient expressed understanding and agreed to proceed.  Interactive audio and video telecommunications were attempted between this nurse and patient, however failed, due to patient having technical difficulties OR patient did not have access to video capability.  We continued and completed visit with audio only.  Some vital signs may be absent or patient reported.   Mikaylah Libbey E Petar Mucci, LPN   Review of Systems     Cardiac Risk Factors include: advanced age (>1men, >59 women);sedentary lifestyle;obesity (BMI >30kg/m2);hypertension;Other (see comment), Risk factor comments: A.Fib, hx of P.E., CHF     Objective:    Today's Vitals   12/27/20 1232 12/27/20 1233  Weight: 176 lb (79.8 kg)   Height: 5\' 3"  (1.6 m)   PainSc:  5    Body mass index is 31.18 kg/m.  Advanced Directives 12/27/2020 11/08/2019 11/08/2019 09/03/2016 11/04/2015 12/24/2014 06/21/2014  Does Patient Have a Medical Advance Directive? Yes Yes Yes Yes Yes Yes Yes  Type of Paramedic of [place];Living will Living will [place];Living will [place];Living will Living will [place];Living will Living will  Does patient want to make changes to medical advance directive? - No - Patient declined No - Patient declined No - Patient  declined - - -  Copy of [place] in Chart? Yes - validated most recent copy scanned in chart (See row information) - - No - copy requested No - copy requested - -  Would patient like information on creating a medical advance directive? - No - Patient declined No - Patient declined - - - -  Pre-existing out of facility DNR order (yellow form or pink MOST form) - - - - - - -    Current Medications (verified) Outpatient Encounter Medications as of 12/27/2020  Medication Sig   ALPRAZolam (XANAX) 0.25 MG tablet Take 2-4 tablets (0.5-1 mg total) by mouth at bedtime as needed. Anxiety   amLODipine (NORVASC) 5 MG tablet Take 1 tablet (5 mg total) by mouth daily.   Ascorbic Acid (VITAMIN C) 1000 MG tablet Take 1,000 mg by mouth daily.   Calcium Carbonate (CALCIUM 600 PO) Take 1 capsule by mouth daily.   Cholecalciferol (VITAMIN D) 2000 UNITS CAPS Take 1 capsule by mouth daily.   diltiazem (CARDIZEM CD) 120 MG 24 hr capsule Take 1 capsule (120 mg total) by mouth daily.   ELIQUIS 2.5 MG TABS tablet TAKE 1 TABLET(2.5 MG) BY MOUTH TWICE DAILY   furosemide (LASIX) 20 MG tablet Take 1 tablet (20 mg total) by mouth daily.   losartan (COZAAR) 100 MG tablet Take 1 tablet (100 mg total) by mouth daily.   LYRICA 100 MG capsule Take 1 capsule (100 mg total) by mouth at bedtime.   nortriptyline (PAMELOR) 10 MG capsule Take 1 capsule (10 mg total) by mouth at bedtime.   Polyethyl Glycol-Propyl Glycol (SYSTANE ULTRA OP) Apply 1 drop  to eye daily as needed (for dry eyes).   No facility-administered encounter medications on file as of 12/27/2020.    Allergies (verified) Codeine, Duloxetine, and Sertraline hcl   History: Past Medical History:  Diagnosis Date   Anemia    hx of    Angiodysplasia 2007   @ colonoscopy   Anxiety    PMH of   Chronic low back pain 06/21/2014   Diverticulosis of colon (without mention of hemorrhage)    DJD (degenerative joint disease)    Esophageal reflux     inactive   History of vertebral fracture 11/2015   HTN (hypertension)    Hyperlipemia 2006   LDL 130   Hypoglycemia    reactive   Pelvic fracture ([place]) 12/30/2011   [facility]   Peripheral neuropathy    Personal history of colonic polyps    adenomatous   Vitamin B12 deficiency    Past Surgical History:  Procedure Laterality Date   cataract surgery  12-26-12 and 01-09-13   COLONOSCOPY  2011   neg   COLONOSCOPY W/ POLYPECTOMY     X 2 , Dr  Verl Blalock; angiodysplasia. Due 2022   DILATION AND CURETTAGE OF UTERUS     FACIAL COSMETIC SURGERY     HEMORROIDECTOMY     LUMBAR LAMINECTOMY/DECOMPRESSION MICRODISCECTOMY  09/10/2011   Procedure: LUMBAR LAMINECTOMY/DECOMPRESSION MICRODISCECTOMY;  Surgeon: Johnn Hai, MD;  Location: [facility];  Service: Orthopedics;  Laterality: N/A;  decompression laminectomy L2-3, L3-4, L4-5   ORIF WRIST FRACTURE  01/11/2012   Procedure: OPEN REDUCTION INTERNAL FIXATION (ORIF) WRIST FRACTURE;  Surgeon: Tennis Must, MD;  Location: [facility];  Service: Orthopedics;  Laterality: Right;   TEAR DUCT PROBING     X 2   TOTAL HIP ARTHROPLASTY  2000   right   TUBAL LIGATION     Family History  Problem Relation Age of Onset   Heart attack Father 69   Kidney disease Mother        renal failure   Throat cancer Sister        smoker   Osteoporosis Sister    Heart attack Brother 63   Stroke Brother 22       smoker   Depression Maternal Uncle    Cancer Brother        X3  lung cancer, all smokers   Peripheral vascular disease Daughter    Colon cancer Neg Hx    Diabetes Neg Hx    Social History   Socioeconomic History   Marital status: Married    Spouse name: Fritz Pickerel   Number of children: 1   Years of education: hs   Highest education level: Not on file  Occupational History   Occupation: retired    Fish farm manager: RETIRED  Tobacco Use   Smoking status: Never   Smokeless tobacco: Never  Substance and Sexual Activity   Alcohol use:  Yes    Alcohol/week: 3.0 standard drinks    Types: 3 Glasses of wine per week    Comment: wine occasionally   Drug use: No   Sexual activity: Yes    Birth control/protection: Diaphragm  Other Topics Concern   Not on file  Social History Narrative   Patient is right handed.   Patient does not drink caffeine.   Lives with husband   Yolanda Bonine lives nearby   Social Determinants of Health   Financial Resource Strain: Low Risk    Difficulty of Paying Living Expenses: Not very hard  Food Insecurity: No Food Insecurity   Worried About Charity fundraiser in the Last Year: Never true   Ran Out of Food in the Last Year: Never true  Transportation Needs: No Transportation Needs   Lack of Transportation (Medical): No   Lack of Transportation (Non-Medical): No  Physical Activity: Insufficiently Active   Days of Exercise per Week: 7 days   Minutes of Exercise per Session: 10 min  Stress: Stress Concern Present   Feeling of Stress : To some extent  Social Connections: Moderately Isolated   Frequency of Communication with Friends and Family: More than three times a week   Frequency of Social Gatherings with Friends and Family: More than three times a week   Attends Religious Services: Never   Marine scientist or Organizations: No   Attends Music therapist: Never   Marital Status: Married    Tobacco Counseling Counseling given: Not Answered   Clinical Intake:  Pre-visit preparation completed: Yes  Pain : 0-10 Pain Score: 5  Pain Type: Chronic pain Pain Location: Generalized Pain Descriptors / Indicators: Aching, Discomfort Pain Onset: More than a month ago Pain Frequency: Intermittent     BMI - recorded: 31.18 Nutritional Status: BMI > 30  Obese Nutritional Risks: None Diabetes: No  How often do you need to have someone help you when you read instructions, pamphlets, or other written materials from your doctor or pharmacy?: 1 - Never  Diabetic?  no  Interpreter Needed?: No  Information entered by :: Aravind Chrismer, LPN   Activities of Daily Living In your present state of health, do you have any difficulty performing the following activities: 12/27/2020  Hearing? Y  Comment mild  Vision? N  Difficulty concentrating or making decisions? Y  Walking or climbing stairs? Y  Dressing or bathing? N  Doing errands, shopping? Y  Preparing Food and eating ? N  Using the Toilet? N  In the past six months, have you accidently leaked urine? Y  Comment wears pantyliners  Do you have problems with loss of bowel control? N  Managing your Medications? Y  Managing your Finances? Y  Housekeeping or managing your Housekeeping? Y  Some recent data might be hidden    Patient Care Team: Carollee Herter, Alferd Apa, DO as PCP - General (Family Medicine) Edrick Oh, MD as Consulting Physician (Nephrology) Kathrynn Ducking, MD as Consulting Physician (Neurology) Renard Hamper, Franchot Gallo, PA-C as Counselor (Dermatology) Cherre Robins, PharmD (Pharmacist)  Indicate any recent Medical Services you may have received from other than [facility] providers in the past year (date may be approximate).     Assessment:   This is a routine wellness examination for [place].  Hearing/Vision screen Hearing Screening - Comments:: Denies hearing difficulties Vision Screening - Comments:: Wears eyeglasses - up to date with annual eye exams with Dr Prudencio Burly  Dietary issues and exercise activities discussed: Current Exercise Habits: Home exercise routine, Type of exercise: stretching, Time (Minutes): 10, Frequency (Times/Week): 7, Weekly Exercise (Minutes/Week): 70, Intensity: Mild, Exercise limited by: cardiac condition(s);respiratory conditions(s);orthopedic condition(s)   Goals Addressed               This Visit's Progress     Have decreased back pain  (pt-stated)   Not on track     [place] 2/9 Scores 12/27/2020  08/07/2020 01/03/2020 06/22/2018 09/03/2016 02/20/2016 11/11/2015  PHQ - 2 Score 0 0 0  0 0 0 3  PHQ- 9 Score - - - - - - 9    Fall Risk Fall Risk  12/27/2020 08/07/2020 01/03/2020 12/29/2017 04/27/2017  Falls in the past year? 1 0 0 No No  Number falls in past yr: 0 0 0 - -  Injury with Fall? 0 0 0 - -  Risk Factor Category  - - - - -  Risk for fall due to : History of fall(s);Impaired balance/gait;Orthopedic patient;Medication side effect - - - -  Follow up Education provided;Falls prevention discussed - Falls evaluation completed - -    FALL RISK PREVENTION PERTAINING TO THE HOME:  Any stairs in or around the home? Yes  - she doesn't use them If so, are there any without handrails? No  Home free of loose throw rugs in walkways, pet beds, electrical cords, etc? Yes  Adequate lighting in your home to reduce risk of falls? Yes   ASSISTIVE DEVICES UTILIZED TO PREVENT FALLS:  Life alert? No  Use of a cane, walker or w/c? Yes  Grab bars in the bathroom? Yes  Shower chair or bench in shower? Yes  Elevated toilet seat or a handicapped toilet? Yes   TIMED UP AND GO:  Was the test performed? No . Telephonic visit.  Cognitive Function: MMSE - Mini Mental State Exam 09/03/2016  Orientation to time 5  Orientation to Place 5  Registration 3  Attention/ Calculation 4  Recall 3  Language- name 2 objects 2  Language- repeat 0  Language- follow 3 step command 3  Language- read & follow direction 1  Write a sentence 1  Copy design 0  Total score 27     6CIT Screen 12/27/2020  What Year? 0 points  What month? 0 points  What time? 0 points  Count back from 20 0 points  Months in reverse 2 points  Repeat phrase 4 points  Total Score 6    Immunizations Immunization History  Administered Date(s) Administered   Fluad Quad(high Dose 65+) 12/29/2018, 01/03/2020   Influenza Split 01/01/2012   Influenza Whole 01/03/2008, 12/30/2009   Influenza, High Dose Seasonal PF 01/04/2013, 01/08/2014,  12/22/2017   Influenza-Unspecified 12/19/2014, 12/25/2015, 01/11/2017   PFIZER(Purple Top)SARS-COV-2 Vaccination 05/14/2019, 06/08/2019, 02/01/2020   Pneumococcal Conjugate-13 02/11/2014   Pneumococcal Polysaccharide-23 12/30/2009, 01/01/2012   Tdap 03/15/2011   Zoster, Live 04/30/2013    TDAP status: Due, Education has been provided regarding the importance of this vaccine. Advised may receive this vaccine at local pharmacy or Health Dept. Aware to provide a copy of the vaccination record if obtained from local pharmacy or Health Dept. Verbalized acceptance and understanding.  Flu Vaccine status: Due, Education has been provided regarding the importance of this vaccine. Advised may receive this vaccine at local pharmacy or Health Dept. Aware to provide a copy of the vaccination record if obtained from local pharmacy or Health Dept. Verbalized acceptance and understanding.  Pneumococcal vaccine status: Up to date  Covid-19 vaccine status: Information provided on how to obtain vaccines.   Qualifies for Shingles Vaccine? Yes   Zostavax completed Yes   Shingrix Completed?: No.    Education has been provided regarding the importance of this vaccine. Patient has been advised to call insurance company to determine out of pocket expense if they have not yet received this vaccine. Advised may also receive vaccine at local pharmacy or Health Dept. Verbalized acceptance and understanding.  Screening Tests Health Maintenance  Topic Date Due   Zoster Vaccines- Shingrix (1  of 2) Never done   COVID-19 Vaccine (4 - Booster for Pfizer series) 04/25/2020   INFLUENZA VACCINE  11/03/2020   TETANUS/TDAP  03/14/2021   DEXA SCAN  Completed   HPV VACCINES  Aged Out    Health Maintenance  Health Maintenance Due  Topic Date Due   Zoster Vaccines- Shingrix (1 of 2) Never done   COVID-19 Vaccine (4 - Booster for Pfizer series) 04/25/2020   INFLUENZA VACCINE  11/03/2020    Colorectal cancer screening:  No longer required.   Mammogram status: No longer required due to age.  Bone Density status: Completed 06/08/2016. Results reflect: Bone density results: OSTEOPOROSIS. Repeat every 2 years.  Lung Cancer Screening: (Low Dose CT Chest recommended if Age 70-80 years, 30 pack-year currently smoking OR have quit w/in 15years.) does not qualify.   Additional Screening:  Hepatitis C Screening: does not qualify  Vision Screening: Recommended annual ophthalmology exams for early detection of glaucoma and other disorders of the eye. Is the patient up to date with their annual eye exam?  Yes  Who is the provider or what is the name of the office in which the patient attends annual eye exams? [facility] If pt is not established with a provider, would they like to be referred to a provider to establish care? No .   Dental Screening: Recommended annual dental exams for proper oral hygiene  Community Resource Referral / Chronic Care Management: CRR required this visit?  No   CCM required this visit?  No      Plan:     I have personally reviewed and noted the following in the patient's chart:   Medical and social history Use of alcohol, tobacco or illicit drugs  Current medications and supplements including opioid prescriptions.  Functional ability and status Nutritional status Physical activity Advanced directives List of other physicians Hospitalizations, surgeries, and ER visits in previous 12 months Vitals Screenings to include cognitive, depression, and falls Referrals and appointments  In addition, I have reviewed and discussed with patient certain preventive protocols, quality metrics, and best practice recommendations. A written personalized care plan for preventive services as well as general preventive health recommendations were provided to patient.     Sandrea Hammond, LPN   [id number]   Nurse Notes: None

## 2020-12-27 NOTE — Patient Instructions (Signed)
Ms. Krystal Henderson , Thank you for taking time to come for your Medicare Wellness Visit. I appreciate your ongoing commitment to your health goals. Please review the following plan we discussed and let me know if I can assist you in the future.   Screening recommendations/referrals: Colonoscopy: No longer required Mammogram: No longer required Bone Density: Done 06/08/2016 - Repeat every 2 years if you desire Recommended yearly ophthalmology/optometry visit for glaucoma screening and checkup Recommended yearly dental visit for hygiene and checkup  Vaccinations: Influenza vaccine: Done 01/03/2020 - Repeat annually Pneumococcal vaccine: Done 01/01/2012 & 02/11/2014 Tdap vaccine: Done 03/15/2011 - Repeat in 10 years *due this year Shingles vaccine: Zostavax done 2015 - Shingrix discussed. Please contact your pharmacy for coverage information.     Covid-19: Done 05/14/19, 35/21, & 02/01/2020  Advanced directives: Please bring the newest copy of your health care power of attorney and living will to the office to be added to your chart at your convenience. The last one we have is from [birth year].  Conditions/risks identified: Do seated exercises every day - leg raises, lifting light weights with your arms, stand and sit repeatedly (holding on to your walker or something steady. Fall prevention exercises.  Next appointment: Follow up in one year for your annual wellness visit    Preventive Care 65 Years and Older, Female Preventive care refers to lifestyle choices and visits with your health care provider that can promote health and wellness. What does preventive care include? A yearly physical exam. This is also called an annual well check. Dental exams once or twice a year. Routine eye exams. Ask your health care provider how often you should have your eyes checked. Personal lifestyle choices, including: Daily care of your teeth and gums. Regular physical activity. Eating a healthy diet. Avoiding tobacco  and drug use. Limiting alcohol use. Practicing safe sex. Taking low-dose aspirin every day. Taking vitamin and mineral supplements as recommended by your health care provider. What happens during an annual well check? The services and screenings done by your health care provider during your annual well check will depend on your age, overall health, lifestyle risk factors, and family history of disease. Counseling  Your health care provider may ask you questions about your: Alcohol use. Tobacco use. Drug use. Emotional well-being. Home and relationship well-being. Sexual activity. Eating habits. History of falls. Memory and ability to understand (cognition). Work and work Statistician. Reproductive health. Screening  You may have the following tests or measurements: Height, weight, and BMI. Blood pressure. Lipid and cholesterol levels. These may be checked every 5 years, or more frequently if you are over 27 years old. Skin check. Lung cancer screening. You may have this screening every year starting at age 52 if you have a 30-pack-year history of smoking and currently smoke or have quit within the past 15 years. Fecal occult blood test (FOBT) of the stool. You may have this test every year starting at age 55. Flexible sigmoidoscopy or colonoscopy. You may have a sigmoidoscopy every 5 years or a colonoscopy every 10 years starting at age 29. Hepatitis C blood test. Hepatitis B blood test. Sexually transmitted disease (STD) testing. Diabetes screening. This is done by checking your blood sugar (glucose) after you have not eaten for a while (fasting). You may have this done every 1-3 years. Bone density scan. This is done to screen for osteoporosis. You may have this done starting at age 63. Mammogram. This may be done every 1-2 years. Talk to your health  care provider about how often you should have regular mammograms. Talk with your health care provider about your test results,  treatment options, and if necessary, the need for more tests. Vaccines  Your health care provider may recommend certain vaccines, such as: Influenza vaccine. This is recommended every year. Tetanus, diphtheria, and acellular pertussis (Tdap, Td) vaccine. You may need a Td booster every 10 years. Zoster vaccine. You may need this after age 58. Pneumococcal 13-valent conjugate (PCV13) vaccine. One dose is recommended after age 7. Pneumococcal polysaccharide (PPSV23) vaccine. One dose is recommended after age 82. Talk to your health care provider about which screenings and vaccines you need and how often you need them. This information is not intended to replace advice given to you by your health care provider. Make sure you discuss any questions you have with your health care provider. Document Released: 04/18/2015 Document Revised: 12/10/2015 Document Reviewed: 01/21/2015 Elsevier Interactive Patient Education  2017 [place] Prevention in the Home Falls can cause injuries. They can happen to people of all ages. There are many things you can do to make your home safe and to help prevent falls. What can I do on the outside of my home? Regularly fix the edges of walkways and driveways and fix any cracks. Remove anything that might make you trip as you walk through a door, such as a raised step or threshold. Trim any bushes or trees on the path to your home. Use bright outdoor lighting. Clear any walking paths of anything that might make someone trip, such as rocks or tools. Regularly check to see if handrails are loose or broken. Make sure that both sides of any steps have handrails. Any raised decks and porches should have guardrails on the edges. Have any leaves, snow, or ice cleared regularly. Use sand or salt on walking paths during winter. Clean up any spills in your garage right away. This includes oil or grease spills. What can I do in the bathroom? Use night  lights. Install grab bars by the toilet and in the tub and shower. Do not use towel bars as grab bars. Use non-skid mats or decals in the tub or shower. If you need to sit down in the shower, use a plastic, non-slip stool. Keep the floor dry. Clean up any water that spills on the floor as soon as it happens. Remove soap buildup in the tub or shower regularly. Attach bath mats securely with double-sided non-slip rug tape. Do not have throw rugs and other things on the floor that can make you trip. What can I do in the bedroom? Use night lights. Make sure that you have a light by your bed that is easy to reach. Do not use any sheets or blankets that are too big for your bed. They should not hang down onto the floor. Have a firm chair that has side arms. You can use this for support while you get dressed. Do not have throw rugs and other things on the floor that can make you trip. What can I do in the kitchen? Clean up any spills right away. Avoid walking on wet floors. Keep items that you use a lot in easy-to-reach places. If you need to reach something above you, use a strong step stool that has a grab bar. Keep electrical cords out of the way. Do not use floor polish or wax that makes floors slippery. If you must use wax, use non-skid floor wax. Do not have throw  rugs and other things on the floor that can make you trip. What can I do with my stairs? Do not leave any items on the stairs. Make sure that there are handrails on both sides of the stairs and use them. Fix handrails that are broken or loose. Make sure that handrails are as long as the stairways. Check any carpeting to make sure that it is firmly attached to the stairs. Fix any carpet that is loose or worn. Avoid having throw rugs at the top or bottom of the stairs. If you do have throw rugs, attach them to the floor with carpet tape. Make sure that you have a light switch at the top of the stairs and the bottom of the stairs. If  you do not have them, ask someone to add them for you. What else can I do to help prevent falls? Wear shoes that: Do not have high heels. Have rubber bottoms. Are comfortable and fit you well. Are closed at the toe. Do not wear sandals. If you use a stepladder: Make sure that it is fully opened. Do not climb a closed stepladder. Make sure that both sides of the stepladder are locked into place. Ask someone to hold it for you, if possible. Clearly mark and make sure that you can see: Any grab bars or handrails. First and last steps. Where the edge of each step is. Use tools that help you move around (mobility aids) if they are needed. These include: Canes. Walkers. Scooters. Crutches. Turn on the lights when you go into a dark area. Replace any light bulbs as soon as they burn out. Set up your furniture so you have a clear path. Avoid moving your furniture around. If any of your floors are uneven, fix them. If there are any pets around you, be aware of where they are. Review your medicines with your doctor. Some medicines can make you feel dizzy. This can increase your chance of falling. Ask your doctor what other things that you can do to help prevent falls. This information is not intended to replace advice given to you by your health care provider. Make sure you discuss any questions you have with your health care provider. Document Released: 01/16/2009 Document Revised: 08/28/2015 Document Reviewed: 04/26/2014 Elsevier Interactive Patient Education  2017 [employer].

## 2020-12-30 ENCOUNTER — Ambulatory Visit (INDEPENDENT_AMBULATORY_CARE_PROVIDER_SITE_OTHER): Payer: Medicare Other | Admitting: Pharmacist

## 2020-12-30 DIAGNOSIS — I10 Essential (primary) hypertension: Secondary | ICD-10-CM

## 2020-12-30 DIAGNOSIS — I4891 Unspecified atrial fibrillation: Secondary | ICD-10-CM

## 2020-12-30 DIAGNOSIS — G609 Hereditary and idiopathic neuropathy, unspecified: Secondary | ICD-10-CM

## 2020-12-30 DIAGNOSIS — I480 Paroxysmal atrial fibrillation: Secondary | ICD-10-CM

## 2021-01-01 DIAGNOSIS — G629 Polyneuropathy, unspecified: Secondary | ICD-10-CM | POA: Diagnosis not present

## 2021-01-01 DIAGNOSIS — N2581 Secondary hyperparathyroidism of renal origin: Secondary | ICD-10-CM | POA: Diagnosis not present

## 2021-01-01 DIAGNOSIS — I5030 Unspecified diastolic (congestive) heart failure: Secondary | ICD-10-CM | POA: Diagnosis not present

## 2021-01-01 DIAGNOSIS — M4800 Spinal stenosis, site unspecified: Secondary | ICD-10-CM | POA: Diagnosis not present

## 2021-01-01 DIAGNOSIS — N189 Chronic kidney disease, unspecified: Secondary | ICD-10-CM | POA: Diagnosis not present

## 2021-01-01 DIAGNOSIS — N184 Chronic kidney disease, stage 4 (severe): Secondary | ICD-10-CM | POA: Diagnosis not present

## 2021-01-01 DIAGNOSIS — E785 Hyperlipidemia, unspecified: Secondary | ICD-10-CM | POA: Diagnosis not present

## 2021-01-02 DIAGNOSIS — I4891 Unspecified atrial fibrillation: Secondary | ICD-10-CM

## 2021-01-02 DIAGNOSIS — I10 Essential (primary) hypertension: Secondary | ICD-10-CM

## 2021-01-02 DIAGNOSIS — I480 Paroxysmal atrial fibrillation: Secondary | ICD-10-CM

## 2021-01-02 NOTE — Patient Instructions (Signed)
Visit Information  PATIENT GOALS:  Goals Addressed             This Visit's Progress    Chronic Care Management Pharmacy Care Plan   On track    CARE PLAN ENTRY (see longitudinal plan of care for additional care plan information)  Current Barriers:  Chronic Disease Management support, education, and care coordination needs related to Hypertension, Hyperlipidemia, Pre-DM, AFib, Heart Failure, Neuropathy, Anxiety, Back Pain, Osteoporosis   Hypertension BP Readings from Last 3 Encounters:  11/24/20 140/84  09/09/20 123/67  08/07/20 132/62  Pharmacist Clinical Goal(s): Over the next 90days, patient will work with PharmD and providers to maintain BP goal <140/90 Current regimen:  Amlodipine 5mg  daily  Losartan 100mg  daily Diltiazem 120mg  daily  Interventions: Discussed blood pressure goals Patient self care activities - Over the next 90 days, patient will: Continue current medications for blood pressure Recommend check blood pressure 2 to 3 times per week and record  Hyperlipidemia Lab Results  Component Value Date/Time   LDLCALC 110 (H) 08/07/2020 03:21 PM   LDLCALC 109 (H) 01/03/2020 04:05 PM   LDLDIRECT 129.0 10/18/2014 11:56 AM  Pharmacist Clinical Goal(s): Over the next 90 days, patient will work with PharmD and providers to achieve LDL goal < 100 Current regimen:  Diet management   Interventions: Discussed LDL goal Discussed diet Patient self care activities - Over the next 90 days, patient will: Limit intake of saturated and trans fat  Atrial Fibrillation / history of pulmonary embolism Pharmacist Clinical Goal(s) Over the next 90 days, patient will work with PharmD and providers to reduce risk of stroke and symptoms related to AFib Current regimen:  Eliquis 2.5mg  twice daily Interventions: Screened for Patient Assistance Program for Eliquis (household income did not qualify) Patient self care activities - Over the next 90 days, patient will: Maintain  current medications for atrial fibrillation  Heart Failure Pharmacist Clinical Goal(s) Over the next 90 days, patient will work with PharmD and providers to reduce symptoms associated with heart failure Current regimen:  Furosemide 20mg  daily Interventions: Encouraged patient to weigh daily Educated patient to notify office if she gains more than 3lbs in one day or 5lbs in one week Patient self care activities - Over the next 90 days, patient will: Weigh daily and monitor for increased swelling or shortness of breath Notify office if she gains more than 3lbs in one day or 5lbs in one week  Neuropathy Pharmacist Clinical Goal(s) Over the next 90 days, patient will work with PharmD and providers to reduce symptoms associated with neuropathy Current regimen:  Lyrica 100mg  daily at bedtime Nortriptyline 10mg  daily Interventions:  Checked for patient assistance for Lyrica - there are no current programs for Lyrica. Also checked GoodRx price for Brand Lyrica and is $800 so patient's price through Medicare is lower. She does not want to consider generic pregabalin (cost would be $30/90 days) Patient self care activities - Over the next 90 days, patient will: Continue to follow up with Dr Jannifer Franklin Continue current therapy  Osteoporosis Pharmacist Clinical Goal(s) Over the next 90 days, patient will work with PharmD and providers to reduce risk of fracture associated with osteoporosis Current regimen:  Calcium Carbonate 600mg   - take 1 tablet twice a day  Vitamin D 2000 units once daily Interventions Consider repeat DEXA Scan Patient self care activities - Over the next 90 days, patient will: Consider repeat DEXA Scan Fall prevention Continue vitamin D 2000 units daily  Health Maintenance  Pharmacist Clinical  Goal(s) Over the next 180 days, patient will work with PharmD and providers to complete health maintenance screenings/vaccinations Interventions: Discussed getting annual flu  vaccine - appointments made for patient and her husband for 01/09/2021 Complete Shingrix vaccine series Consider Bivalent Covid Booster  Medication management Pharmacist Clinical Goal(s): Over the next 90 days, patient will work with PharmD and providers to maintain optimal medication adherence Current pharmacy: [facility] Interventions Comprehensive medication review performed. Continue current medication management strategy Patient self care activities - Over the next 90 days, patient will: Focus on medication adherence by filling and taking medications appropriately  Take medications as prescribed Report any questions or concerns to PharmD and/or provider(s)  Please see past updates related to this goal by clicking on the "Past Updates" button in the selected goal          The patient verbalized understanding of instructions, educational materials, and care plan provided today and declined offer to receive copy of patient instructions, educational materials, and care plan.   Telephone follow up appointment with care management team member scheduled for: 3 months  Cherre Robins, PharmD Clinical Pharmacist [facility] [place]

## 2021-01-02 NOTE — Chronic Care Management (AMB) (Signed)
Chronic Care Management Pharmacy Note  01/02/2021 Name:  Krystal Henderson MRN:  [id number] DOB:  January 18, 1933  Subjective: Krystal Henderson is an 87 y.o. year old female who is a primary patient of Ann Held, DO.  The CCM team was consulted for assistance with disease management and care coordination needs.    Engaged with patient by telephone for follow up visit in response to provider referral for pharmacy case management and/or care coordination services.   Consent to Services:  The patient was given information about Chronic Care Management services, agreed to services, and gave verbal consent prior to initiation of services.  Please see initial visit note for detailed documentation.   Patient Care Team: Carollee Herter, Alferd Apa, DO as PCP - General (Family Medicine) Edrick Oh, MD as Consulting Physician (Nephrology) Kathrynn Ducking, MD as Consulting Physician (Neurology) Renard Hamper Franchot Gallo, PA-C as Counselor (Dermatology) Cherre Robins, PharmD (Pharmacist)  Recent office visits: 08/07/2020 - PCP (Dr Etter Sjogren) Soreness and Swelling in right lower extremity; No med changes; Recommended using compression hose.  Recent consult visits: 11/24/2020 - Cardio (Dr Curt Bears) F/U afib. patient continues to have fatigue during daytime. Changed metoprolol to diltiazem 160m daily.  Hospital visits: None in previous 6 months  Objective:  Lab Results  Component Value Date   CREATININE 2.12 (H) 08/07/2020   CREATININE 2.09 (H) 01/03/2020   CREATININE 1.79 (H) 11/12/2019    Lab Results  Component Value Date   HGBA1C 5.7 (H) 11/09/2019   Last diabetic Eye exam: No results found for: HMDIABEYEEXA  Last diabetic Foot exam: No results found for: HMDIABFOOTEX      Component Value Date/Time   CHOL 200 08/07/2020 1521   TRIG 75.0 08/07/2020 1521   HDL 74.30 08/07/2020 1521   CHOLHDL 3 08/07/2020 1521   VLDL 15.0 08/07/2020 1521   LDLCALC 110 (H) 08/07/2020 1521   LDLCALC  109 (H) 01/03/2020 1605   LDLDIRECT 129.0 10/18/2014 1156    Hepatic Function Latest Ref Rng & Units 08/07/2020 01/03/2020 07/02/2019  Total Protein 6.0 - 8.3 g/dL 6.8 6.9 6.5  Albumin 3.5 - 5.2 g/dL 3.9 - 3.9  AST 0 - 37 U/L _0 ALT 0 - 35 U/L _1 Alk Phosphatase 39 - 117 U/L 66 - 53  Total Bilirubin 0.2 - 1.2 mg/dL 0.5 0.5 0.4  Bilirubin, Direct 0.0 - 0.3 mg/dL - - -    Lab Results  Component Value Date/Time   TSH 2.432 11/09/2019 01:50 AM   TSH 3.53 11/26/2013 09:03 AM   TSH 3.28 03/27/2013 11:26 AM   FREET4 1.1 10/02/2008 11:33 AM   FREET4 1.0 08/17/2007 11:47 AM    CBC Latest Ref Rng & Units 08/07/2020 11/12/2019 11/11/2019  WBC 4.0 - 10.5 K/uL 6.5 6.8 7.1  Hemoglobin 12.0 - 15.0 g/dL 10.9(L) 11.4(L) 11.6(L)  Hematocrit 36.0 - 46.0 % 34.4(L) 35.1(L) 35.5(L)  Platelets 150.0 - 400.0 K/uL 309.0 325 307    Lab Results  Component Value Date/Time   VD25OH 63.76 08/07/2020 03:21 PM   VD25OH 58 12/23/2011 02:48 PM   VD25OH 60 03/15/2011 10:39 AM    Clinical ASCVD: No  The ASCVD Risk score (Arnett DK, et al., 2019) failed to calculate for the following reasons:   The 2019 ASCVD risk score is only valid for ages 414to 784   Other: CHADS2VASc = 4 See care plan below for DEXA  Social History   Tobacco Use  Smoking Status Never  Smokeless Tobacco Never   BP Readings from Last 3 Encounters:  11/24/20 140/84  09/09/20 123/67  08/07/20 132/62   Pulse Readings from Last 3 Encounters:  11/24/20 67  09/09/20 68  08/07/20 67   Wt Readings from Last 3 Encounters:  12/27/20 176 lb (79.8 kg)  11/24/20 174 lb 12.8 oz (79.3 kg)  09/09/20 174 lb 6.4 oz (79.1 kg)    Assessment: Review of patient past medical history, allergies, medications, health status, including review of consultants reports, laboratory and other test data, was performed as part of comprehensive evaluation and provision of chronic care management services.   SDOH:  (Social Determinants of Health)  assessments and interventions performed:  SDOH Interventions    Flowsheet Row Most Recent Value  SDOH Interventions   Financial Strain Interventions Other (Comment)  [In coverage gap and cost of Eliquis and Lyrica is high. Patient's income is too high for Eliquis assistance. She prefers brand Lyrica and no assistance program for Lyrica currently. Also Good Rx discount is not lower than her cost for Lyrica.]       CCM Care Plan  Allergies  Allergen Reactions   Codeine Nausea Only   Duloxetine Other (See Comments)    REACTION: intolerance--She does not remember taking this Rx- states she did not feel well    Sertraline Hcl Other (See Comments)    REACTION: intolerance-- Did not help with Depression    Medications Reviewed Today     Reviewed by Cherre Robins, PharmD (Pharmacist) on 12/30/20 at 1443  Med List Status: <None>   Medication Order Taking? Sig Documenting Provider Last Dose Status Informant  ALPRAZolam (XANAX) 0.25 MG tablet [id number] Yes Take 2-4 tablets (0.5-1 mg total) by mouth at bedtime as needed. Anxiety [name] R, DO Taking Active   amLODipine (NORVASC) 5 MG tablet [id number] Yes Take 1 tablet (5 mg total) by mouth daily. [name] R, DO Taking Active   Ascorbic Acid (VITAMIN C) 1000 MG tablet [id number] Yes Take 1,000 mg by mouth daily. [provider] Taking Active Self  Calcium Carbonate (CALCIUM 600 PO) [id number] Yes Take 1 capsule by mouth daily. [provider] Taking Active Self  Cholecalciferol (VITAMIN D) 2000 UNITS CAPS [id number] Yes Take 1 capsule by mouth daily. [provider] Taking Active Self  diltiazem (CARDIZEM CD) 120 MG 24 hr capsule [id number] Yes Take 1 capsule (120 mg total) by mouth daily. Constance Haw, MD Taking Active   ELIQUIS 2.5 MG TABS tablet [id number] Yes TAKE 1 TABLET(2.5 MG) BY MOUTH TWICE DAILY Camnitz, Will Hassell Done, MD Taking Active   furosemide (LASIX) 20 MG tablet [id number] Yes Take 1  tablet (20 mg total) by mouth daily. Ann Held, DO Taking Active   losartan (COZAAR) 100 MG tablet [id number] Yes Take 1 tablet (100 mg total) by mouth daily. Ann Held, DO Taking Active   LYRICA 100 MG capsule [id number] Yes Take 1 capsule (100 mg total) by mouth at bedtime. Kathrynn Ducking, MD Taking Active   nortriptyline (PAMELOR) 10 MG capsule [id number] Yes Take 1 capsule (10 mg total) by mouth at bedtime. Kathrynn Ducking, MD Taking Active   Polyethyl Glycol-Propyl Glycol (SYSTANE ULTRA OP) [id number] Yes Apply 1 drop to eye daily as needed (for dry eyes). [provider] Taking Active Self            Patient Active Problem List   Diagnosis Date Noted   Venous stasis  dermatitis of right lower extremity 08/07/2020   Insomnia 08/07/2020   Acute on chronic combined systolic and diastolic CHF (congestive heart failure) ([place]) 11/12/2019   AF (paroxysmal atrial fibrillation) ([place]) 11/10/2019   Impaired mobility and ADLs 11/10/2019   Acute respiratory failure with hypoxia ([place]) 11/08/2019   Acute pulmonary embolism ([place]) 11/08/2019   Impacted cerumen of left ear 01/03/2019   Lesion of skin of scalp 12/30/2018   Edema 06/22/2018   Iron deficiency anemia 04/02/2018   Lumbar post-laminectomy syndrome 07/15/2017   Lumbar compression fracture ([place]) 11/26/2015   Lower extremity edema 10/23/2015   Chronic low back pain 06/21/2014   Pain in joint, shoulder region 02/11/2014   Unspecified vitamin D deficiency 03/30/2013   CKD (chronic kidney disease), stage IV ([place]) 03/28/2013   Acute kidney injury ([place]) 01/01/2012   Spinal stenosis 12/23/2011   Osteopenia 12/23/2011   Renal insufficiency, mild 08/24/2011   Vitamin B 12 deficiency 09/24/2010   IBS (irritable bowel syndrome) 09/24/2010   Peripheral neuropathy, idiopathic 09/24/2010   GERD 08/12/2009   DIVERTICULOSIS, COLON 09/08/2007   Angiodysplasia of intestine with hemorrhage 09/08/2007   ARTHRITIS  09/08/2007   COLONIC POLYPS, ADENOMATOUS, HX OF 09/08/2007   HYPERLIPIDEMIA 01/23/2007   Primary hypertension 01/23/2007   Osteoporosis 01/23/2007   HYPOGLYCEMIA, REACTIVE 06/30/2006    Immunization History  Administered Date(s) Administered   Fluad Quad(high Dose 65+) 12/29/2018, 01/03/2020   Influenza Split 01/01/2012   Influenza Whole 01/03/2008, 12/30/2009   Influenza, High Dose Seasonal PF 01/04/2013, 01/08/2014, 12/22/2017   Influenza-Unspecified 12/19/2014, 12/25/2015, 01/11/2017   PFIZER(Purple Top)SARS-COV-2 Vaccination 05/14/2019, 06/08/2019, 02/01/2020   Pneumococcal Conjugate-13 02/11/2014   Pneumococcal Polysaccharide-23 12/30/2009, 01/01/2012   Tdap 03/15/2011   Zoster, Live 04/30/2013    Conditions to be addressed/monitored: Atrial Fibrillation, HTN, HLD, CKD Stage 4, and osteoporosis; insomnia; CHF; GERD  Care Plan : General Pharmacy (Adult)  Updates made by Cherre Robins, PHARMD since 01/02/2021 12:00 AM     Problem: HTN; HDL; afib; CHF; CKD-4; osteoporosis; insomnia; neuropathy; impaired mobility and ADLS   Priority: High     Long-Range Goal: Provide education, support and care coordination for medication therapy and chronic conditions   Start Date: 09/29/2020  This Visit's Progress: On track  Priority: High  Note:   Current Barriers:  Unable to achieve control of hyperlipidemia  Does not maintain contact with provider office In Medicare coverage gap - cost of Eliquis and Lyrica high  Pharmacist Clinical Goal(s):  Over the next 90 days, patient will achieve control of hyperlipidemia as evidenced by LDL <100 maintain control of BP  as evidenced by BP <140/90  contact provider office for questions/concerns as evidenced notation of same in electronic health record Assess for financial assistance for cost of Eliquis and Lyrica  Interventions: 1:1 collaboration with Carollee Herter, Alferd Apa, DO regarding development and update of comprehensive plan of care as  evidenced by provider attestation and co-signature Inter-disciplinary care team collaboration (see longitudinal plan of care) Comprehensive medication review performed; medication list updated in electronic medical record  Hypertension Controlled; Recent office BP has been at goal; BP goal <140/90 Current regimen:  Amlodipine 52m daily  Losartan 1061mdaily Diltiazem 12061maily Previous medications tried: metoprolol - stopped due to fatigue Home blood pressure today = 115/59 Denies dizziness or lightheadedness. Patient states daytime fatigue has improved a little since metoprolol was changed to diltiazem Interventions: Discussed blood pressure goals Recommended continue current regimen for blood pressure   Hyperlipidemia Not at goal; LDL goal <  100 Patient not able to exercise due to limited mobility Current regimen:  Diet management   Interventions: Discussed LDL goal Discussed diet; Limit intake of saturated and trans fat  Atrial Fibrillation / history of pulmonary embolism Goal: reduce risk of stroke and recurrent PE Currently in Medicare coverage gap and Eliquis cost is >$100 per month Last Scr = 2.12 and est CrCl = 24 mL/min; Wt = 79.1kg and age 54yo. Dose of Eliquis is appropriately lowered.  Current regimen:  Eliquis 2.98m twice daily Diltiazem 1252mdaily  Metoprolol stopped 11/2020 by Dr CaCurt Bearso see if daytime fatigue improves. Patient states has improved a little.  Interventions: Screened for Patient Assistance Program for Eliquis at last visit (household income did not qualify) Maintain current medications for atrial fibrillation and stroke / PE prevention  Heart Failure Goal: reduce exacerbations / hospitalization and minimize symptoms of CHF HFmrEF - last EF was 45-50% (11/09/2019) Current regimen:  Furosemide 2051maily Due to CrCl <25 not candidate at this times for SGLT2 theapry. Also has history of hypoglycemia but this was several years ago and per  patient she cannot recall.  Interventions: Encouraged patient to weigh daily Educated patient to notify office if she gains more than 3lbs in one day or 5lbs in one week  Neuropathy Goal: reduce symptoms associated with neuropathy Reviewed renal dosing for Lyrica / pregabalin Last est CrCL = 65m61mn Dosing recommendations for Cr CL 15-30 mL/min for Lyrica is 100 to 150 mg per day Current regimen:  Lyrica 100mg25mly at bedtime Nortriptyline 10mg 44my Patient prefers brand name Lyrica. Reports that she is in coverage gap and cost if >$100 per 90 days.  Interventions:  Checked for patient assistance for Lyrica - there are no current programs for Lyrica. Also checked GoodRx price for Brand Lyrica and is $800 so patient's price through Medicare is lower. She does not want to consider generic pregabalin (cost would be $30/90 days) Continue to follow up with Dr WillisJannifer Franklinnue current therapy  Osteoporosis Goal:  reduce risk of fracture associated with osteoporosis Last vitamin D levels was 63 (08/07/2020) Last DEXA 06/08/2016 Left Forearm Radius 33% T-score =  -3.0.  Lumbar spine was not utilized due to surgical changes and compression fracture. Right femur was not scanned due to the patient having a right hip replacement. Left Femur Neck T-Score =  -1.8  Current regimen:  Calcium Carbonate 600mg  39mke 1 tablet daily  Vitamin D 2000 units once daily Took Alendronate for 5 years Current low renal function prohibits using bisphosphonate therapy  Prolia has been discussed in past but patient declined Interventions Consider repeat DEXA Scan - patient declined Discussed Prolia therapy - patient declined Recommended calcium 1200mg da76mfrom supplements and diet Continue vitamin D 2000 units daily Fall prevention discussed  Health Maintenance  Interventions: Discussed getting annual flu vaccine - appointments made for patient and her husband for 01/09/2021 Complete Shingrix vaccine  series Consider Bivalent Covid Booster  Medication management Current pharmacy: [facility] Interventions Comprehensive medication review performed. Continue current medication management strategy  Patient Goals/Self-Care Activities Over the next 90days, patient will:  take medications as prescribed, weigh daily, and contact provider if weight gain of 3lbs in 24 hours or 5lbs in 1 week, and f  Follow Up Plan:  Clinical pharmacist in 3 months          Medication Assistance:  At previous visit assessed for Eliquis PAP for 2022 since patient is in medicare coverage gap but income  does not meet program requirements.  Also checked for assistance for brand Lyrica cost but no current funding available and GoodRx price higher that patient's current cost through [facility] plan  Patient's preferred pharmacy is:  [facility] [place], [address] [place] [address] Phone: [phone] Fax: [place], [place] [address] [facility] [address] [address] Phone: [phone] Fax: [phone]  Follow Up:  Patient agrees to Care Plan and Follow-up.  Plan: Telephone follow up appointment with care management team member scheduled for: Clinical pharmacist f/u in 3 to 4 months.    Cherre Robins, PharmD Clinical Pharmacist [facility] [phone]

## 2021-01-09 ENCOUNTER — Ambulatory Visit: Payer: Medicare Other

## 2021-01-09 ENCOUNTER — Encounter (HOSPITAL_COMMUNITY): Payer: Self-pay

## 2021-01-09 ENCOUNTER — Other Ambulatory Visit (HOSPITAL_BASED_OUTPATIENT_CLINIC_OR_DEPARTMENT_OTHER): Payer: Self-pay

## 2021-01-09 MED ORDER — INFLUENZA VAC A&B SA ADJ QUAD 0.5 ML IM PRSY
PREFILLED_SYRINGE | INTRAMUSCULAR | 0 refills | Status: DC
  Filled 2021-01-09: qty 0.5, 1d supply, fill #0

## 2021-01-19 ENCOUNTER — Other Ambulatory Visit: Payer: Self-pay | Admitting: Family Medicine

## 2021-01-19 DIAGNOSIS — I10 Essential (primary) hypertension: Secondary | ICD-10-CM

## 2021-01-19 DIAGNOSIS — R600 Localized edema: Secondary | ICD-10-CM

## 2021-02-11 ENCOUNTER — Other Ambulatory Visit: Payer: Self-pay

## 2021-02-11 DIAGNOSIS — C44722 Squamous cell carcinoma of skin of right lower limb, including hip: Secondary | ICD-10-CM | POA: Diagnosis not present

## 2021-02-11 DIAGNOSIS — L298 Other pruritus: Secondary | ICD-10-CM | POA: Diagnosis not present

## 2021-02-11 DIAGNOSIS — L218 Other seborrheic dermatitis: Secondary | ICD-10-CM | POA: Diagnosis not present

## 2021-02-11 DIAGNOSIS — D485 Neoplasm of uncertain behavior of skin: Secondary | ICD-10-CM | POA: Diagnosis not present

## 2021-02-11 DIAGNOSIS — C44619 Basal cell carcinoma of skin of left upper limb, including shoulder: Secondary | ICD-10-CM | POA: Diagnosis not present

## 2021-02-11 DIAGNOSIS — L538 Other specified erythematous conditions: Secondary | ICD-10-CM | POA: Diagnosis not present

## 2021-02-11 DIAGNOSIS — L570 Actinic keratosis: Secondary | ICD-10-CM | POA: Diagnosis not present

## 2021-02-11 DIAGNOSIS — C44519 Basal cell carcinoma of skin of other part of trunk: Secondary | ICD-10-CM | POA: Diagnosis not present

## 2021-02-11 DIAGNOSIS — L728 Other follicular cysts of the skin and subcutaneous tissue: Secondary | ICD-10-CM | POA: Diagnosis not present

## 2021-02-11 DIAGNOSIS — L820 Inflamed seborrheic keratosis: Secondary | ICD-10-CM | POA: Diagnosis not present

## 2021-02-12 ENCOUNTER — Ambulatory Visit (INDEPENDENT_AMBULATORY_CARE_PROVIDER_SITE_OTHER): Payer: Medicare Other | Admitting: Family Medicine

## 2021-02-12 ENCOUNTER — Encounter: Payer: Self-pay | Admitting: Family Medicine

## 2021-02-12 VITALS — BP 130/78 | HR 72 | Temp 98.2°F | Resp 20 | Ht 63.0 in | Wt 167.8 lb

## 2021-02-12 DIAGNOSIS — E785 Hyperlipidemia, unspecified: Secondary | ICD-10-CM | POA: Diagnosis not present

## 2021-02-12 DIAGNOSIS — I10 Essential (primary) hypertension: Secondary | ICD-10-CM | POA: Diagnosis not present

## 2021-02-12 DIAGNOSIS — E782 Mixed hyperlipidemia: Secondary | ICD-10-CM

## 2021-02-12 DIAGNOSIS — D649 Anemia, unspecified: Secondary | ICD-10-CM

## 2021-02-12 DIAGNOSIS — N184 Chronic kidney disease, stage 4 (severe): Secondary | ICD-10-CM | POA: Diagnosis not present

## 2021-02-12 DIAGNOSIS — I509 Heart failure, unspecified: Secondary | ICD-10-CM

## 2021-02-12 DIAGNOSIS — R600 Localized edema: Secondary | ICD-10-CM

## 2021-02-12 DIAGNOSIS — Z0000 Encounter for general adult medical examination without abnormal findings: Secondary | ICD-10-CM

## 2021-02-12 DIAGNOSIS — E538 Deficiency of other specified B group vitamins: Secondary | ICD-10-CM | POA: Diagnosis not present

## 2021-02-12 DIAGNOSIS — Z0001 Encounter for general adult medical examination with abnormal findings: Secondary | ICD-10-CM | POA: Diagnosis not present

## 2021-02-12 MED ORDER — FUROSEMIDE 40 MG PO TABS
40.0000 mg | ORAL_TABLET | Freq: Every day | ORAL | 3 refills | Status: DC
Start: 2021-02-12 — End: 2022-08-19

## 2021-02-12 NOTE — Progress Notes (Signed)
Subjective:   By signing my name below, I, Shehryar Baig, attest that this documentation has been prepared under the direction and in the presence of Dr. [name], DO. 02/12/2021    Patient ID: Krystal Henderson, female    DOB: June 15, 1932, 87 y.o.   MRN: [id number]  Chief Complaint  Patient presents with   Annual Exam    Pt states fasting     HPI Patient is in today for a comprehensive physical exam.   Ankle swelling- She complains that her ankle swelling has worsened since yesterday. Her dermatologist removed one of her medications yesterday which she thinks caused the swelling. She continues taking 20 mg lasix daily PO and is interested in increasing her medication to manage her symptoms. She denies her SOB worsening.  Refills- She is UTD on medication refills a this time.  Nephrologist- She continues regularly seeing her nephrology specialist and states that she seen them recently.   CPE She denies having any fever, new moles, congestion, sore throat, new muscle pain, new joint pain, chest pain, cough, SOB, wheezing, n/v/d, constipation, blood in stool, dysuria, frequency, hematuria, or headaches at this time. Immunizations- She has 3 pfizer Covid-19 vaccines at this time. She is interested in receiving the bivalent Covid-19 vaccine after this visit. She has receiving a flu vaccine this year.  Dexa- Last completed 06/08/2016. Results showed she is osteoporotic. Repeat in 2 years.  Vision- She is due for vision care and is planning on scheduling an appointment.    Past Medical History:  Diagnosis Date   Anemia    hx of    Angiodysplasia 2007   @ colonoscopy   Anxiety    PMH of   Chronic low back pain 06/21/2014   Diverticulosis of colon (without mention of hemorrhage)    DJD (degenerative joint disease)    Esophageal reflux    inactive   History of vertebral fracture 11/2015   HTN (hypertension)    Hyperlipemia 2006   LDL 130   Hypoglycemia    reactive    Pelvic fracture ([place]) 12/30/2011   [facility]   Peripheral neuropathy    Personal history of colonic polyps    adenomatous   Vitamin B12 deficiency     Past Surgical History:  Procedure Laterality Date   cataract surgery  12-26-12 and 01-09-13   COLONOSCOPY  2011   neg   COLONOSCOPY W/ POLYPECTOMY     X 2 , Dr  Verl Blalock; angiodysplasia. Due 2022   DILATION AND CURETTAGE OF UTERUS     FACIAL COSMETIC SURGERY     HEMORROIDECTOMY     LUMBAR LAMINECTOMY/DECOMPRESSION MICRODISCECTOMY  09/10/2011   Procedure: LUMBAR LAMINECTOMY/DECOMPRESSION MICRODISCECTOMY;  Surgeon: Johnn Hai, MD;  Location: [facility];  Service: Orthopedics;  Laterality: N/A;  decompression laminectomy L2-3, L3-4, L4-5   ORIF WRIST FRACTURE  01/11/2012   Procedure: OPEN REDUCTION INTERNAL FIXATION (ORIF) WRIST FRACTURE;  Surgeon: Tennis Must, MD;  Location: [facility];  Service: Orthopedics;  Laterality: Right;   TEAR DUCT PROBING     X 2   TOTAL HIP ARTHROPLASTY  2000   right   TUBAL LIGATION      Family History  Problem Relation Age of Onset   Heart attack Father 17   Kidney disease Mother        renal failure   Throat cancer Sister        smoker   Osteoporosis Sister    Heart attack  Brother 27   Stroke Brother 47       smoker   Depression Maternal Uncle    Cancer Brother        X3  lung cancer, all smokers   Peripheral vascular disease Daughter    Colon cancer Neg Hx    Diabetes Neg Hx     Social History   Socioeconomic History   Marital status: Married    Spouse name: Fritz Pickerel   Number of children: 1   Years of education: hs   Highest education level: Not on file  Occupational History   Occupation: retired    Fish farm manager: RETIRED  Tobacco Use   Smoking status: Never   Smokeless tobacco: Never  Substance and Sexual Activity   Alcohol use: Yes    Alcohol/week: 3.0 standard drinks    Types: 3 Glasses of wine per week    Comment: wine occasionally   Drug use: No    Sexual activity: Yes    Birth control/protection: Diaphragm  Other Topics Concern   Not on file  Social History Narrative   Patient is right handed.   Patient does not drink caffeine.   Lives with husband   Yolanda Bonine lives nearby   Social Determinants of Health   Financial Resource Strain: Medium Risk   Difficulty of Paying Living Expenses: Somewhat hard  Food Insecurity: No Food Insecurity   Worried About Charity fundraiser in the Last Year: Never true   Arboriculturist in the Last Year: Never true  Transportation Needs: No Transportation Needs   Lack of Transportation (Medical): No   Lack of Transportation (Non-Medical): No  Physical Activity: Insufficiently Active   Days of Exercise per Week: 7 days   Minutes of Exercise per Session: 10 min  Stress: Stress Concern Present   Feeling of Stress : To some extent  Social Connections: Moderately Isolated   Frequency of Communication with Friends and Family: More than three times a week   Frequency of Social Gatherings with Friends and Family: More than three times a week   Attends Religious Services: Never   Marine scientist or Organizations: No   Attends Music therapist: Never   Marital Status: Married  Human resources officer Violence: Not At Risk   Fear of Current or Ex-Partner: No   Emotionally Abused: No   Physically Abused: No   Sexually Abused: No    Outpatient Medications Prior to Visit  Medication Sig Dispense Refill   ALPRAZolam (XANAX) 0.25 MG tablet Take 2-4 tablets (0.5-1 mg total) by mouth at bedtime as needed. Anxiety 30 tablet 1   amLODipine (NORVASC) 5 MG tablet Take 1 tablet (5 mg total) by mouth daily. 90 tablet 1   Ascorbic Acid (VITAMIN C) 1000 MG tablet Take 1,000 mg by mouth daily.     Calcium Carbonate (CALCIUM 600 PO) Take 1 capsule by mouth daily.     Cholecalciferol (VITAMIN D) 2000 UNITS CAPS Take 1 capsule by mouth daily.     diltiazem (CARDIZEM CD) 120 MG 24 hr capsule Take 1  capsule (120 mg total) by mouth daily. 30 capsule 6   ELIQUIS 2.5 MG TABS tablet TAKE 1 TABLET(2.5 MG) BY MOUTH TWICE DAILY 60 tablet 5   influenza vaccine adjuvanted (FLUAD) 0.5 ML injection Inject into the muscle. 0.5 mL 0   losartan (COZAAR) 100 MG tablet Take 1 tablet (100 mg total) by mouth daily. 90 tablet 1   LYRICA 100 MG capsule Take 1  capsule (100 mg total) by mouth at bedtime. 90 capsule 1   nortriptyline (PAMELOR) 10 MG capsule Take 1 capsule (10 mg total) by mouth at bedtime. 90 capsule 3   Polyethyl Glycol-Propyl Glycol (SYSTANE ULTRA OP) Apply 1 drop to eye daily as needed (for dry eyes).     furosemide (LASIX) 20 MG tablet TAKE 1 TABLET(20 MG) BY MOUTH DAILY 90 tablet 1   No facility-administered medications prior to visit.    Allergies  Allergen Reactions   Codeine Nausea Only   Duloxetine Other (See Comments)    REACTION: intolerance--She does not remember taking this Rx- states she did not feel well    Sertraline Hcl Other (See Comments)    REACTION: intolerance-- Did not help with Depression    Review of Systems  Constitutional:  Negative for fever and malaise/fatigue.  HENT:  Negative for congestion.   Eyes:  Negative for blurred vision.  Respiratory:  Negative for shortness of breath.   Cardiovascular:  Positive for leg swelling. Negative for chest pain and palpitations.  Gastrointestinal:  Negative for abdominal pain, blood in stool and nausea.  Genitourinary:  Negative for dysuria and frequency.  Musculoskeletal:  Negative for falls.  Skin:  Negative for rash.  Neurological:  Negative for dizziness, loss of consciousness and headaches.  Endo/Heme/Allergies:  Negative for environmental allergies.  Psychiatric/Behavioral:  Negative for depression. The patient is not nervous/anxious.       Objective:    Physical Exam Vitals and nursing note reviewed.  Constitutional:      General: She is not in acute distress.    Appearance: Normal appearance. She is not  ill-appearing.  HENT:     Head: Normocephalic and atraumatic.     Right Ear: External ear normal.     Left Ear: External ear normal.  Eyes:     Extraocular Movements: Extraocular movements intact.     Pupils: Pupils are equal, round, and reactive to light.  Cardiovascular:     Rate and Rhythm: Normal rate and regular rhythm.     Heart sounds: Normal heart sounds. No murmur heard.   No gallop.  Pulmonary:     Effort: Pulmonary effort is normal. No respiratory distress.     Breath sounds: Normal breath sounds. No wheezing or rales.  Musculoskeletal:     Right lower leg: 1+ Pitting Edema present.     Left lower leg: 1+ Edema present.  Skin:    General: Skin is warm and dry.  Neurological:     Mental Status: She is alert and oriented to person, place, and time.  Psychiatric:        Behavior: Behavior normal.        Judgment: Judgment normal.    BP 130/78 (BP Location: Left Arm, Patient Position: Sitting, Cuff Size: Normal)   Pulse 72   Temp 98.2 F (36.8 C) (Oral)   Resp 20   Ht 5\' 3"  (1.6 m)   Wt 167 lb 12.8 oz (76.1 kg)   SpO2 98%   BMI 29.72 kg/m  Wt Readings from Last 3 Encounters:  02/12/21 167 lb 12.8 oz (76.1 kg)  12/27/20 176 lb (79.8 kg)  11/24/20 174 lb 12.8 oz (79.3 kg)    Diabetic Foot Exam - Simple   No data filed    Lab Results  Component Value Date   WBC 6.5 08/07/2020   HGB 10.9 (L) 08/07/2020   HCT 34.4 (L) 08/07/2020   PLT 309.0 08/07/2020   GLUCOSE 79 08/07/2020  CHOL 200 08/07/2020   TRIG 75.0 08/07/2020   HDL 74.30 08/07/2020   LDLDIRECT 129.0 10/18/2014   LDLCALC 110 (H) 08/07/2020   ALT 8 08/07/2020   AST 9 08/07/2020   NA 139 08/07/2020   K 4.9 08/07/2020   CL 104 08/07/2020   CREATININE 2.12 (H) 08/07/2020   BUN 26 (H) 08/07/2020   CO2 26 08/07/2020   TSH 2.432 11/09/2019   INR 1.4 (H) 11/08/2019   HGBA1C 5.7 (H) 11/09/2019    Lab Results  Component Value Date   TSH 2.432 11/09/2019   Lab Results  Component Value Date    WBC 6.5 08/07/2020   HGB 10.9 (L) 08/07/2020   HCT 34.4 (L) 08/07/2020   MCV 90.0 08/07/2020   PLT 309.0 08/07/2020   Lab Results  Component Value Date   NA 139 08/07/2020   K 4.9 08/07/2020   CO2 26 08/07/2020   GLUCOSE 79 08/07/2020   BUN 26 (H) 08/07/2020   CREATININE 2.12 (H) 08/07/2020   BILITOT 0.5 08/07/2020   ALKPHOS 66 08/07/2020   AST 9 08/07/2020   ALT 8 08/07/2020   PROT 6.8 08/07/2020   ALBUMIN 3.9 08/07/2020   CALCIUM 9.8 08/07/2020   ANIONGAP 10 11/12/2019   GFR 20.75 (L) 08/07/2020   Lab Results  Component Value Date   CHOL 200 08/07/2020   Lab Results  Component Value Date   HDL 74.30 08/07/2020   Lab Results  Component Value Date   LDLCALC 110 (H) 08/07/2020   Lab Results  Component Value Date   TRIG 75.0 08/07/2020   Lab Results  Component Value Date   CHOLHDL 3 08/07/2020   Lab Results  Component Value Date   HGBA1C 5.7 (H) 11/09/2019       Assessment & Plan:   Problem List Items Addressed This Visit       Unprioritized   CKD (chronic kidney disease), stage IV ([place])    Per nephrology      HYPERLIPIDEMIA    Encourage heart healthy diet such as MIND or DASH diet, increase exercise, avoid trans fats, simple carbohydrates and processed foods, consider a krill or fish or flaxseed oil cap daily.       Relevant Medications   furosemide (LASIX) 40 MG tablet   Lower extremity edema    [employer] dose of lasix F/u 2-3 weeks       Primary hypertension    Well controlled, no changes to meds. Encouraged heart healthy diet such as the DASH diet and exercise as tolerated.       Relevant Medications   furosemide (LASIX) 40 MG tablet   Other Relevant Orders   CBC with Differential/Platelet   VITAMIN D 25 Hydroxy (Vit-D Deficiency, Fractures)   Other Visit Diagnoses     Vitamin B12 deficiency    -  Primary   Relevant Orders   VITAMIN D 25 Hydroxy (Vit-D Deficiency, Fractures)   Vitamin B12   Anemia, unspecified type        Relevant Orders   CBC with Differential/Platelet   VITAMIN D 25 Hydroxy (Vit-D Deficiency, Fractures)   Hyperlipidemia, unspecified hyperlipidemia type       Relevant Medications   furosemide (LASIX) 40 MG tablet   Other Relevant Orders   CBC with Differential/Platelet   Comprehensive metabolic panel   Lipid panel   VITAMIN D 25 Hydroxy (Vit-D Deficiency, Fractures)   Chronic congestive heart failure, unspecified heart failure type ([place])  Relevant Medications   furosemide (LASIX) 40 MG tablet        Meds ordered this encounter  Medications   furosemide (LASIX) 40 MG tablet    Sig: Take 1 tablet (40 mg total) by mouth daily.    Dispense:  90 tablet    Refill:  3    I, Dr. [name], DO, personally preformed the services described in this documentation.  All medical record entries made by the scribe were at my direction and in my presence.  I have reviewed the chart and discharge instructions (if applicable) and agree that the record reflects my personal performance and is accurate and complete. 02/12/2021   I,Shehryar Baig,acting as a scribe for Ann Held, DO.,have documented all relevant documentation on the behalf of Ann Held, DO,as directed by  Ann Held, DO while in the presence of Ann Held, DO.   Ann Held, DO

## 2021-02-12 NOTE — Assessment & Plan Note (Signed)
[employer] dose of lasix F/u 2-3 weeks

## 2021-02-12 NOTE — Patient Instructions (Signed)
Preventive Care 40 Years and Older, Female Preventive care refers to lifestyle choices and visits with your health care provider that can promote health and wellness. Preventive care visits are also called wellness exams. What can I expect for my preventive care visit? Counseling Your health care provider may ask you questions about your: Medical history, including: Past medical problems. Family medical history. Pregnancy and menstrual history. History of falls. Current health, including: Memory and ability to understand (cognition). Emotional well-being. Home life and relationship well-being. Sexual activity and sexual health. Lifestyle, including: Alcohol, nicotine or tobacco, and drug use. Access to firearms. Diet, exercise, and sleep habits. Work and work Statistician. Sunscreen use. Safety issues such as seatbelt and bike helmet use. Physical exam Your health care provider will check your: Height and weight. These may be used to calculate your BMI (body mass index). BMI is a measurement that tells if you are at a healthy weight. Waist circumference. This measures the distance around your waistline. This measurement also tells if you are at a healthy weight and may help predict your risk of certain diseases, such as type 2 diabetes and high blood pressure. Heart rate and blood pressure. Body temperature. Skin for abnormal spots. What immunizations do I need? Vaccines are usually given at various ages, according to a schedule. Your health care provider will recommend vaccines for you based on your age, medical history, and lifestyle or other factors, such as travel or where you work. What tests do I need? Screening Your health care provider may recommend screening tests for certain conditions. This may include: Lipid and cholesterol levels. Hepatitis C test. Hepatitis B test. HIV (human immunodeficiency virus) test. STI (sexually transmitted infection) testing, if you are at  risk. Lung cancer screening. Colorectal cancer screening. Diabetes screening. This is done by checking your blood sugar (glucose) after you have not eaten for a while (fasting). Mammogram. Talk with your health care provider about how often you should have regular mammograms. BRCA-related cancer screening. This may be done if you have a family history of breast, ovarian, tubal, or peritoneal cancers. Bone density scan. This is done to screen for osteoporosis. Talk with your health care provider about your test results, treatment options, and if necessary, the need for more tests. Follow these instructions at home: Eating and drinking  Eat a diet that includes fresh fruits and vegetables, whole grains, lean protein, and low-fat dairy products. Limit your intake of foods with high amounts of sugar, saturated fats, and salt. Take vitamin and mineral supplements as recommended by your health care provider. Do not drink alcohol if your health care provider tells you not to drink. If you drink alcohol: Limit how much you have to 0-1 drink a day. Know how much alcohol is in your drink. In the [place] one drink equals one 12 oz bottle of beer (355 mL), one 5 oz glass of wine (148 mL), or one 1 oz glass of hard liquor (44 mL). Lifestyle Brush your teeth every morning and night with fluoride toothpaste. Floss one time each day. Exercise for at least 30 minutes 5 or more days each week. Do not use any products that contain nicotine or tobacco. These products include cigarettes, chewing tobacco, and vaping devices, such as e-cigarettes. If you need help quitting, ask your health care provider. Do not use drugs. If you are sexually active, practice safe sex. Use a condom or other form of protection in order to prevent STIs. Take aspirin only as told by your  health care provider. Make sure that you understand how much to take and what form to take. Work with your health care provider to find out whether it  is safe and beneficial for you to take aspirin daily. Ask your health care provider if you need to take a cholesterol-lowering medicine (statin). Find healthy ways to manage stress, such as: Meditation, yoga, or listening to music. Journaling. Talking to a trusted person. Spending time with friends and family. Minimize exposure to UV radiation to reduce your risk of skin cancer. Safety Always wear your seat belt while driving or riding in a vehicle. Do not drive: If you have been drinking alcohol. Do not ride with someone who has been drinking. When you are tired or distracted. While texting. If you have been using any mind-altering substances or drugs. Wear a helmet and other protective equipment during sports activities. If you have firearms in your house, make sure you follow all gun safety procedures. What's next? Visit your health care provider once a year for an annual wellness visit. Ask your health care provider how often you should have your eyes and teeth checked. Stay up to date on all vaccines. This information is not intended to replace advice given to you by your health care provider. Make sure you discuss any questions you have with your health care provider. Document Revised: 09/17/2020 Document Reviewed: 09/17/2020 Elsevier Patient Education  [place].

## 2021-02-12 NOTE — Assessment & Plan Note (Signed)
Well controlled, no changes to meds. Encouraged heart healthy diet such as the DASH diet and exercise as tolerated.  °

## 2021-02-12 NOTE — Assessment & Plan Note (Signed)
Encourage heart healthy diet such as MIND or DASH diet, increase exercise, avoid trans fats, simple carbohydrates and processed foods, consider a krill or fish or flaxseed oil cap daily.  °

## 2021-02-12 NOTE — Assessment & Plan Note (Signed)
Per nephrology 

## 2021-02-13 LAB — COMPREHENSIVE METABOLIC PANEL
ALT: 10 U/L (ref 0–35)
AST: 11 U/L (ref 0–37)
Albumin: 4.3 g/dL (ref 3.5–5.2)
Alkaline Phosphatase: 59 U/L (ref 39–117)
BUN: 32 mg/dL — ABNORMAL HIGH (ref 6–23)
CO2: 24 mEq/L (ref 19–32)
Calcium: 10.1 mg/dL (ref 8.4–10.5)
Chloride: 102 mEq/L (ref 96–112)
Creatinine, Ser: 2.5 mg/dL — ABNORMAL HIGH (ref 0.40–1.20)
GFR: 16.97 mL/min — ABNORMAL LOW (ref >60.00)
Glucose, Bld: 88 mg/dL (ref 70–99)
Potassium: 4.6 mEq/L (ref 3.5–5.1)
Sodium: 138 mEq/L (ref 135–145)
Total Bilirubin: 0.6 mg/dL (ref 0.2–1.2)
Total Protein: 7.3 g/dL (ref 6.0–8.3)

## 2021-02-13 LAB — LIPID PANEL
Cholesterol: 230 mg/dL — ABNORMAL HIGH (ref 0–200)
HDL: 87.3 mg/dL (ref >39.00)
LDL Cholesterol: 123 mg/dL — ABNORMAL HIGH (ref 0–99)
NonHDL: 142.57
Total CHOL/HDL Ratio: 3
Triglycerides: 96 mg/dL (ref 0.0–149.0)
VLDL: 19.2 mg/dL (ref 0.0–40.0)

## 2021-02-13 LAB — CBC WITH DIFFERENTIAL/PLATELET
Basophils Absolute: 0.1 10*3/uL (ref 0.0–0.1)
Basophils Relative: 1 % (ref 0.0–3.0)
Eosinophils Absolute: 0.1 10*3/uL (ref 0.0–0.7)
Eosinophils Relative: 1.3 % (ref 0.0–5.0)
HCT: 37.2 % (ref 36.0–46.0)
Hemoglobin: 11.9 g/dL — ABNORMAL LOW (ref 12.0–15.0)
Lymphocytes Relative: 23.6 % (ref 12.0–46.0)
Lymphs Abs: 1.4 10*3/uL (ref 0.7–4.0)
MCHC: 32.1 g/dL (ref 30.0–36.0)
MCV: 92.2 fl (ref 78.0–100.0)
Monocytes Absolute: 0.5 10*3/uL (ref 0.1–1.0)
Monocytes Relative: 8.5 % (ref 3.0–12.0)
Neutro Abs: 3.9 10*3/uL (ref 1.4–7.7)
Neutrophils Relative %: 65.6 % (ref 43.0–77.0)
Platelets: 357 10*3/uL (ref 150.0–400.0)
RBC: 4.04 Mil/uL (ref 3.87–5.11)
RDW: 16.2 % — ABNORMAL HIGH (ref 11.5–15.5)
WBC: 5.9 10*3/uL (ref 4.0–10.5)

## 2021-02-13 LAB — VITAMIN D 25 HYDROXY (VIT D DEFICIENCY, FRACTURES): VITD: 73.62 ng/mL (ref 30.00–100.00)

## 2021-02-13 LAB — VITAMIN B12: Vitamin B-12: 179 pg/mL — ABNORMAL LOW (ref 211–911)

## 2021-02-23 ENCOUNTER — Telehealth: Payer: Self-pay | Admitting: Family Medicine

## 2021-02-23 NOTE — Telephone Encounter (Signed)
Pt called back about b-12 and wants to know how important it is and if there is another way to get b-12 w/o coming in office since she is handicapped.

## 2021-02-23 NOTE — Telephone Encounter (Signed)
OTC? How much would you recommend?

## 2021-02-24 NOTE — Telephone Encounter (Signed)
Pt. Thougth about it and she would like to get more information regarding the b-12 shot before committing to it.

## 2021-02-25 ENCOUNTER — Encounter: Payer: Self-pay | Admitting: Family Medicine

## 2021-03-03 NOTE — Telephone Encounter (Signed)
Pt called. LDVM 

## 2021-03-05 ENCOUNTER — Ambulatory Visit (INDEPENDENT_AMBULATORY_CARE_PROVIDER_SITE_OTHER): Payer: Medicare Other | Admitting: *Deleted

## 2021-03-05 DIAGNOSIS — E538 Deficiency of other specified B group vitamins: Secondary | ICD-10-CM

## 2021-03-05 MED ORDER — CYANOCOBALAMIN 1000 MCG/ML IJ SOLN
1000.0000 ug | Freq: Once | INTRAMUSCULAR | Status: AC
  Administered 2021-03-05: 1000 ug via INTRAMUSCULAR

## 2021-03-05 NOTE — Progress Notes (Signed)
Krystal Henderson is a 87 y.o. female presents to the office today to start b12 injection, per physician's orders.  Today is the first of 4 weekly injections.  Original order from 02/12/21 lab: B12 is low--- she would benefit from b12 injections weekly x4 then monthly.  B12 1075mcg was administered in left deltoid today. Patient tolerated injection.  Patient due for follow up labs/provider appt: Yes. Date due:08/13/21, appt made Yes Patient next injection due: 03/12/21 , appt made Yes

## 2021-03-12 ENCOUNTER — Ambulatory Visit (INDEPENDENT_AMBULATORY_CARE_PROVIDER_SITE_OTHER): Payer: Medicare Other

## 2021-03-12 DIAGNOSIS — E538 Deficiency of other specified B group vitamins: Secondary | ICD-10-CM | POA: Diagnosis not present

## 2021-03-12 MED ORDER — CYANOCOBALAMIN 1000 MCG/ML IJ SOLN
1000.0000 ug | Freq: Once | INTRAMUSCULAR | Status: AC
  Administered 2021-03-12: 1000 ug via INTRAMUSCULAR

## 2021-03-12 NOTE — Progress Notes (Signed)
Krystal Henderson is a 87 y.o. female presents to the office today to start b12 injection, per physician's orders.  Today is the second of 4 weekly injections.  Original order from 02/12/21 lab: B12 is low--- she would benefit from b12 injections weekly x4 then monthly.   B12 1076mcg was administered in left deltoid today. Patient tolerated injection.   Patient due for follow up labs/provider appt: Yes. Date due:08/13/21, appt made Yes Patient next injection due: 03/19/21 , appt made Yes

## 2021-03-16 ENCOUNTER — Telehealth: Payer: Medicare Other

## 2021-03-16 ENCOUNTER — Telehealth: Payer: Self-pay | Admitting: Pharmacist

## 2021-03-16 NOTE — Telephone Encounter (Signed)
Attempted to contact patient for Chronic Care Management follow up phone visit. Unable to reach patient. LM on VM with my contact number [phone].  Also sent MyChart message to reschedule appointment.

## 2021-03-19 ENCOUNTER — Ambulatory Visit: Payer: Medicare Other | Admitting: Neurology

## 2021-03-19 ENCOUNTER — Ambulatory Visit (INDEPENDENT_AMBULATORY_CARE_PROVIDER_SITE_OTHER): Payer: Medicare Other

## 2021-03-19 DIAGNOSIS — E538 Deficiency of other specified B group vitamins: Secondary | ICD-10-CM | POA: Diagnosis not present

## 2021-03-19 MED ORDER — CYANOCOBALAMIN 1000 MCG/ML IJ SOLN
1000.0000 ug | Freq: Once | INTRAMUSCULAR | Status: AC
  Administered 2021-03-19: 1000 ug via INTRAMUSCULAR

## 2021-03-19 NOTE — Progress Notes (Signed)
Krystal Henderson is a 87 y.o. female presents to the office today to start b12 injection, per physician's orders.  Today is the third of 4 weekly injections.  Original order from 02/12/21 lab: B12 is low--- she would benefit from b12 injections weekly x4 then monthly.   B12 1059mcg was administered in right deltoid today. Patient tolerated injection.   Patient due for follow up labs/provider appt: Yes. Date due:08/13/21, appt made Yes  Patient next injection due: 03/26/21 , appt made Yes

## 2021-03-20 ENCOUNTER — Telehealth: Payer: Self-pay | Admitting: Pharmacist

## 2021-03-20 NOTE — Telephone Encounter (Signed)
Attempted to contact patient for Chronic Care Management follow up phone visit Monday 03/16/2021. Unable to reach patient. LM on VM with my contact number [phone]. I also sent message through MyChart about rescheduling. Her husband reported that primary number I called was his cell and requested that I try her at home number to rescheduled.  Tried to call to reschedule today but no answer. Will have schedulers continue to reach out to patient to reschedule Chronic Care Management appt in the next 30 days.

## 2021-03-23 ENCOUNTER — Ambulatory Visit (INDEPENDENT_AMBULATORY_CARE_PROVIDER_SITE_OTHER): Payer: Medicare Other | Admitting: Pharmacist

## 2021-03-23 DIAGNOSIS — I480 Paroxysmal atrial fibrillation: Secondary | ICD-10-CM

## 2021-03-23 DIAGNOSIS — R600 Localized edema: Secondary | ICD-10-CM

## 2021-03-23 DIAGNOSIS — G609 Hereditary and idiopathic neuropathy, unspecified: Secondary | ICD-10-CM

## 2021-03-23 DIAGNOSIS — I10 Essential (primary) hypertension: Secondary | ICD-10-CM

## 2021-03-23 DIAGNOSIS — E782 Mixed hyperlipidemia: Secondary | ICD-10-CM

## 2021-03-23 DIAGNOSIS — I509 Heart failure, unspecified: Secondary | ICD-10-CM

## 2021-03-23 DIAGNOSIS — E785 Hyperlipidemia, unspecified: Secondary | ICD-10-CM

## 2021-03-23 DIAGNOSIS — N184 Chronic kidney disease, stage 4 (severe): Secondary | ICD-10-CM

## 2021-03-23 NOTE — Chronic Care Management (AMB) (Signed)
Chronic Care Management Pharmacy Note  03/23/2021 Name:  Krystal Henderson MRN:  [id number] DOB:  21-Apr-1932  Subjective: Krystal Henderson is an 87 y.o. year old female who is a primary patient of Ann Held, DO.  The CCM team was consulted for assistance with disease management and care coordination needs.    Engaged with patient by telephone for follow up visit in response to provider referral for pharmacy case management and/or care coordination services.   Consent to Services:  The patient was given information about Chronic Care Management services, agreed to services, and gave verbal consent prior to initiation of services.  Please see initial visit note for detailed documentation.   Patient Care Team: Carollee Herter, Alferd Apa, DO as PCP - General (Family Medicine) Edrick Oh, MD as Consulting Physician (Nephrology) Kathrynn Ducking, MD as Consulting Physician (Neurology) Cherre Robins, [place] (Pharmacist) Druscilla Brownie, MD as Referring Physician (Dermatology) Constance Haw, MD as Consulting Physician (Cardiology) Opthamology, [facility] (Ophthalmology)  Recent office visits: 12/152022 - B12 injection 03/12/2021 B12 injection 03/05/2021 - B12 injection 02/12/2021 - Fam Med (Dr Carollee Herter) Seen for preventative health visit. Increased furosemide from 78m daily to 478mdaily due to ankle swelling. Recommended elevate legs. B12 noted to be low - recommended start B12 injection weekly for 4 weeks. 08/07/2020 - PCP (Dr LoEtter SjogrenSoreness and Swelling in right lower extremity; No med changes; Recommended using compression hose.  Recent consult visits: 11/24/2020 - Cardio (Dr CaCurt BearsF/U afib. patient continues to have fatigue during daytime. Changed metoprolol to diltiazem 12076maily.  Hospital visits: None in previous 6 months  Objective:  Lab Results  Component Value Date   CREATININE 2.50 (H) 02/12/2021   CREATININE 2.12 (H) 08/07/2020    CREATININE 2.09 (H) 01/03/2020    Lab Results  Component Value Date   HGBA1C 5.7 (H) 11/09/2019   Last diabetic Eye exam: No results found for: HMDIABEYEEXA  Last diabetic Foot exam: No results found for: HMDIABFOOTEX      Component Value Date/Time   CHOL 230 (H) 02/12/2021 1448   TRIG 96.0 02/12/2021 1448   HDL 87.30 02/12/2021 1448   CHOLHDL 3 02/12/2021 1448   VLDL 19.2 02/12/2021 1448   LDLCALC 123 (H) 02/12/2021 1448   LDLCALC 109 (H) 01/03/2020 1605   LDLDIRECT 129.0 10/18/2014 1156    Hepatic Function Latest Ref Rng & Units 02/12/2021 08/07/2020 01/03/2020  Total Protein 6.0 - 8.3 g/dL 7.3 6.8 6.9  Albumin 3.5 - 5.2 g/dL 4.3 3.9 -  AST 0 - 37 U/L _0 ALT 0 - 35 U/L _1 Alk Phosphatase 39 - 117 U/L 59 66 -  Total Bilirubin 0.2 - 1.2 mg/dL 0.6 0.5 0.5  Bilirubin, Direct 0.0 - 0.3 mg/dL - - -    Lab Results  Component Value Date/Time   TSH 2.432 11/09/2019 01:50 AM   TSH 3.53 11/26/2013 09:03 AM   TSH 3.28 03/27/2013 11:26 AM   FREET4 1.1 10/02/2008 11:33 AM   FREET4 1.0 08/17/2007 11:47 AM    CBC Latest Ref Rng & Units 02/12/2021 08/07/2020 11/12/2019  WBC 4.0 - 10.5 K/uL 5.9 6.5 6.8  Hemoglobin 12.0 - 15.0 g/dL 11.9(L) 10.9(L) 11.4(L)  Hematocrit 36.0 - 46.0 % 37.2 34.4(L) 35.1(L)  Platelets 150.0 - 400.0 K/uL 357.0 309.0 325    Lab Results  Component Value Date/Time   VD25OH 73.62 02/12/2021 02:48 PM   VD25OH 63.76 08/07/2020 03:21 PM  Clinical ASCVD: No  The ASCVD Risk score (Arnett DK, et al., 2019) failed to calculate for the following reasons:   The 2019 ASCVD risk score is only valid for ages 51 to 29    Other: CHADS2VASc = 4 See care plan below for DEXA  Social History   Tobacco Use  Smoking Status Never  Smokeless Tobacco Never   BP Readings from Last 3 Encounters:  02/12/21 130/78  11/24/20 140/84  09/09/20 123/67   Pulse Readings from Last 3 Encounters:  02/12/21 72  11/24/20 67  09/09/20 68   Wt Readings from Last 3  Encounters:  02/12/21 167 lb 12.8 oz (76.1 kg)  12/27/20 176 lb (79.8 kg)  11/24/20 174 lb 12.8 oz (79.3 kg)    Assessment: Review of patient past medical history, allergies, medications, health status, including review of consultants reports, laboratory and other test data, was performed as part of comprehensive evaluation and provision of chronic care management services.   SDOH:  (Social Determinants of Health) assessments and interventions performed:  SDOH Interventions    Flowsheet Row Most Recent Value  SDOH Interventions   Financial Strain Interventions Other (Comment)  [In coverage gap and cost of Eliquis and Lyrica is high. Patient's income is too high for Eliquis assistance. She prefers brand Lyrica and no assistance program for Lyrica currently. Also Good Rx discount is not lower than her cost for Lyrica.]  Physical Activity Interventions Intervention Not Indicated  [does leg exercises and walks in home]       CCM Care Plan  Allergies  Allergen Reactions   Codeine Nausea Only   Duloxetine Other (See Comments)    REACTION: intolerance--She does not remember taking this Rx- states she did not feel well    Sertraline Hcl Other (See Comments)    REACTION: intolerance-- Did not help with Depression    Medications Reviewed Today     Reviewed by Cherre Robins, RPH-CPP (Pharmacist) on 03/23/21 at 1442  Med List Status: <None>   Medication Order Taking? Sig Documenting Provider Last Dose Status Informant  ALPRAZolam (XANAX) 0.25 MG tablet [id number] Yes Take 2-4 tablets (0.5-1 mg total) by mouth at bedtime as needed. Anxiety [name] R, DO Taking Active   amLODipine (NORVASC) 5 MG tablet [id number] Yes Take 1 tablet (5 mg total) by mouth daily. [name] R, DO Taking Active   Ascorbic Acid (VITAMIN C) 1000 MG tablet [id number] Yes Take 1,000 mg by mouth daily. [provider] Taking Active Self  Calcium Carbonate (CALCIUM 600 PO) [id number] Yes Take 1  capsule by mouth daily. [provider] Taking Active Self  Cholecalciferol (VITAMIN D) 2000 UNITS CAPS [id number] Yes Take 1 capsule by mouth daily. [provider] Taking Active Self  diltiazem (CARDIZEM CD) 120 MG 24 hr capsule [id number] Yes Take 1 capsule (120 mg total) by mouth daily. Constance Haw, MD Taking Active   ELIQUIS 2.5 MG TABS tablet [id number] Yes TAKE 1 TABLET(2.5 MG) BY MOUTH TWICE DAILY Camnitz, Will Hassell Done, MD Taking Active   furosemide (LASIX) 40 MG tablet [id number] Yes Take 1 tablet (40 mg total) by mouth daily. Ann Held, DO Taking Active   losartan (COZAAR) 100 MG tablet [id number] Yes Take 1 tablet (100 mg total) by mouth daily. Ann Held, DO Taking Active   LYRICA 100 MG capsule [id number] Yes Take 1 capsule (100 mg total) by mouth at bedtime. Kathrynn Ducking, MD Taking Active   nortriptyline [facility]) 10  MG capsule [id number] Yes Take 1 capsule (10 mg total) by mouth at bedtime. Kathrynn Ducking, MD Taking Active   Polyethyl Glycol-Propyl Glycol (SYSTANE ULTRA OP) [id number] Yes Apply 1 drop to eye daily as needed (for dry eyes). [provider] Taking Active Self            Patient Active Problem List   Diagnosis Date Noted   Venous stasis dermatitis of right lower extremity 08/07/2020   Insomnia 08/07/2020   Acute on chronic combined systolic and diastolic CHF (congestive heart failure) ([place]) 11/12/2019   AF (paroxysmal atrial fibrillation) ([place]) 11/10/2019   Impaired mobility and ADLs 11/10/2019   Acute respiratory failure with hypoxia ([place]) 11/08/2019   Acute pulmonary embolism ([place]) 11/08/2019   Impacted cerumen of left ear 01/03/2019   Lesion of skin of scalp 12/30/2018   Edema 06/22/2018   Iron deficiency anemia 04/02/2018   Lumbar post-laminectomy syndrome 07/15/2017   Lumbar compression fracture ([place]) 11/26/2015   Lower extremity edema 10/23/2015   Chronic low back pain 06/21/2014   Pain in  joint, shoulder region 02/11/2014   Unspecified vitamin D deficiency 03/30/2013   CKD (chronic kidney disease), stage IV ([place]) 03/28/2013   Acute kidney injury ([place]) 01/01/2012   Spinal stenosis 12/23/2011   Osteopenia 12/23/2011   Renal insufficiency, mild 08/24/2011   Vitamin B 12 deficiency 09/24/2010   IBS (irritable bowel syndrome) 09/24/2010   Peripheral neuropathy, idiopathic 09/24/2010   GERD 08/12/2009   DIVERTICULOSIS, COLON 09/08/2007   Angiodysplasia of intestine with hemorrhage 09/08/2007   ARTHRITIS 09/08/2007   COLONIC POLYPS, ADENOMATOUS, HX OF 09/08/2007   HYPERLIPIDEMIA 01/23/2007   Primary hypertension 01/23/2007   Osteoporosis 01/23/2007   HYPOGLYCEMIA, REACTIVE 06/30/2006    Immunization History  Administered Date(s) Administered   Fluad Quad(high Dose 65+) 12/29/2018, 01/03/2020, 01/09/2021   Influenza Split 01/01/2012   Influenza Whole 01/03/2008, 12/30/2009   Influenza, High Dose Seasonal PF 01/04/2013, 01/08/2014, 12/22/2017   Influenza-Unspecified 12/19/2014, 12/25/2015, 01/11/2017   PFIZER(Purple Top)SARS-COV-2 Vaccination 05/14/2019, 06/08/2019, 02/01/2020   Pneumococcal Conjugate-13 02/11/2014   Pneumococcal Polysaccharide-23 12/30/2009, 01/01/2012   Tdap 03/15/2011   Zoster, Live 04/30/2013    Conditions to be addressed/monitored: Atrial Fibrillation, HTN, HLD, CKD Stage 4, and osteoporosis; insomnia; CHF; GERD  Care Plan : General Pharmacy (Adult)  Updates made by Cherre Robins, RPH-CPP since 03/23/2021 12:00 AM     Problem: HTN; HDL; afib; CHF; CKD-4; osteoporosis; insomnia; neuropathy; impaired mobility and ADLS   Priority: High     Long-Range Goal: Provide education, support and care coordination for medication therapy and chronic conditions   Start Date: 09/29/2020  Recent Progress: On track  Priority: High  Note:   Current Barriers:  Unable to achieve control of hyperlipidemia  Does not maintain contact with provider office In  Medicare coverage gap - cost of Eliquis and Lyrica high  Pharmacist Clinical Goal(s):  Over the next 90 days, patient will achieve control of hyperlipidemia as evidenced by LDL <100 maintain control of BP  as evidenced by BP <140/90  contact provider office for questions/concerns as evidenced notation of same in electronic health record Assess for financial assistance for cost of Eliquis and Lyrica  Interventions: 1:1 collaboration with Carollee Herter, Alferd Apa, DO regarding development and update of comprehensive plan of care as evidenced by provider attestation and co-signature Inter-disciplinary care team collaboration (see longitudinal plan of care) Comprehensive medication review performed; medication list updated in electronic medical record  Hypertension Controlled; Recent office BP has been at  goal; BP goal <140/90 Current regimen:  Amlodipine 13m daily  Losartan 1078mdaily Diltiazem 12056maily Furosemide 53m32mily (increased 02/12/2021) Previous medications tried: metoprolol - stopped due to fatigue Home blood pressure: 120 - 130 / 60-70 Denies dizziness or lightheadedness.  Patient reports edema improved since furosemide dose increased Interventions: Discussed blood pressure goals Recommended continue current regimen for blood pressure  Recheck BMP since furosemide dose increased (patient to see nephrologist 03/31/2021 and BMP will likely be checked); If continues to have edema but furosemide affects renal function might consider lowering dose of amlodipine and increase diltiazem to 180mg65mly   Hyperlipidemia Not at goal; LDL goal < 100 Lipid Panel  Patient not able to exercise due to limited mobility - only about 10 minutes per day Current regimen:  Diet management   Interventions: Discussed LDL goal Discussed diet; Limit intake of saturated and trans fat  Atrial Fibrillation / history of pulmonary embolism Goal: reduce risk of stroke and recurrent PE Currently  in Medicare coverage gap and last refill for Eliquis was $99. Patient has been screened for patient assistance program in 2022 but household income did not qualify. Last Scr = 2.50; Wt = 79.1kg and age 88yo.41yoe of Eliquis is appropriately lowered.  Current regimen:  Eliquis 2.5mg t53me daily Diltiazem 120mg d86m  Metoprolol stopped 11/2020 by Dr CamnitzCurt Bears if daytime fatigue improves. Patient states has improved a little.  Interventions: Maintain current medications for atrial fibrillation and stroke / PE prevention  Heart Failure Goal: reduce exacerbations / hospitalization and minimize symptoms of CHF HFmrEF - last EF was 45-50% (11/09/2019) Current regimen:  Furosemide 53mg da34m(increased 02/12/2021) Due to CrCl <25 not candidate at this times for SGLT2 theapry. Also has history of hypoglycemia but this was several years ago and per patient she cannot recall.  Interventions: Encouraged patient to weigh daily Educated patient to notify office if she gains more than 3lbs in one day or 5lbs in one week Recommend recheck BMP / CMP - patient to see nephrology 03/31/2021 and anticipate labs to be drawn.   Neuropathy Goal: reduce symptoms associated with neuropathy Reviewed renal dosing for Lyrica / pregabalin Last est CrCL = 24mL/min37ming recommendations for Cr CL 15-30 mL/min for Lyrica is 100 to 150 mg per day Current regimen:  Lyrica 100mg dail64m bedtime Nortriptyline 10mg daily59mient prefers brand name Lyrica. Reports that she is in coverage gap and cost if >$100 per 90 days.  Interventions:  Checked for patient assistance for Lyrica - there are no current programs for Lyrica. Also checked GoodRx price for Brand Lyrica and is $800 so patient's price through Medicare is lower. She does not want to consider generic pregabalin (cost would be $30/90 days) Continue to follow up with Dr Willis ContJannifer Franklinurrent therapy  Osteoporosis Goal:  reduce risk of fracture  associated with osteoporosis Last vitamin D levels was 63 (08/07/2020) Last DEXA 06/08/2016 Left Forearm Radius 33% T-score =  -3.0.  Lumbar spine was not utilized due to surgical changes and compression fracture. Right femur was not scanned due to the patient having a right hip replacement. Left Femur Neck T-Score =  -1.8  Current regimen:  Calcium Carbonate 600mg  - tak28mtablet daily  Vitamin D 2000 units once daily Took Alendronate for 5 years Current low renal function prohibits using bisphosphonate therapy  Prolia has been discussed in past but patient declined Interventions Consider repeat DEXA Scan - patient declined Discussed Prolia therapy -  patient declined Recommended calcium 1288m daily from supplements and diet Continue vitamin D 2000 units daily Fall prevention discussed  Health Maintenance  Interventions: Complete Shingrix vaccine series and Tdap - considering in 2023 Consider Bivalent Covid Booster- patient declined.   Medication management Current pharmacy: [facility] Interventions Comprehensive medication review performed. Continue current medication management strategy  Patient Goals/Self-Care Activities Over the next 90days, patient will:  take medications as prescribed, weigh daily, and contact provider if weight gain of 3lbs in 24 hours or 5lbs in 1 week, and f  Follow Up Plan:  Clinical pharmacist in 3 months           Medication Assistance:  At previous visit assessed for Eliquis PAP for 2022 since patient is in medicare coverage gap but income does not meet program requirements.  Also checked for assistance for brand Lyrica cost but no current funding available and GoodRx price higher that patient's current cost through [facility]  [facility]:  [facility] [address] [address]Phone: [phone]:  [address] [place] [facility]Phone: [phone]: [phone]  Follow Up:  Patient agrees to Care Plan and Follow-up.  Plan: Telephone follow up appointment with care management team member scheduled for: Clinical pharmacist f/u in 3 months.    TCherre Robins PharmD Clinical Pharmacist [phone]

## 2021-03-23 NOTE — Patient Instructions (Signed)
Krystal Henderson It was a pleasure speaking with you today.  I have attached a summary of our visit today and information about your health goals.   Our next appointment is by telephone on June 22, 2021 at 10:30am  Please call the care guide team at [phone] if you need to cancel or reschedule your appointment.   If you have any questions or concerns, please feel free to contact me either at the phone number below or with a MyChart message.   Keep up the good work!  Cherre Robins, PharmD Clinical Pharmacist [facility] [phone] (direct line)  [phone] (main office number)  CARE PLAN ENTRY (see longitudinal plan of care for additional care plan information)  Current Barriers:  Chronic Disease Management support, education, and care coordination needs related to Hypertension, Hyperlipidemia, Pre-DM, AFib, Heart Failure, Neuropathy, Anxiety, Back Pain, Osteoporosis   Hypertension BP Readings from Last 3 Encounters:  02/12/21 130/78  11/24/20 140/84  09/09/20 123/67   Pharmacist Clinical Goal(s): Over the next 90days, patient will work with PharmD and providers to maintain BP goal <140/90 Current regimen:  Amlodipine 5mg  daily  Losartan 100mg  daily Diltiazem 120mg  daily  Interventions: Discussed blood pressure goals Patient self care activities - Over the next 90 days, patient will: Continue current medications for blood pressure Recommend check blood pressure 2 to 3 times per week and record  Hyperlipidemia Lab Results  Component Value Date/Time   LDLCALC 123 (H) 02/12/2021 02:48 PM   LDLCALC 109 (H) 01/03/2020 04:05 PM   LDLDIRECT 129.0 10/18/2014 11:56 AM   Pharmacist Clinical Goal(s): Over the next 90 days, patient will work with PharmD and providers to achieve LDL goal < 100 Current regimen:  Diet management   Interventions: Discussed LDL goal Discussed diet Patient self care activities - Over the next 90 days, patient  will: Limit intake of saturated and trans fat  Atrial Fibrillation / history of pulmonary embolism Pharmacist Clinical Goal(s) Over the next 90 days, patient will work with PharmD and providers to reduce risk of stroke and symptoms related to AFib Current regimen:  Eliquis 2.5mg  twice daily Interventions: Screened for Patient Assistance Program for Eliquis (household income did not qualify) Patient self care activities - Over the next 90 days, patient will: Maintain current medications for atrial fibrillation  Heart Failure Pharmacist Clinical Goal(s) Over the next 90 days, patient will work with PharmD and providers to reduce symptoms associated with heart failure Current regimen:  Furosemide 40mg  daily Interventions: Encouraged patient to weigh daily Educated patient to notify office if she gains more than 3lbs in one day or 5lbs in one week Patient self care activities - Over the next 90 days, patient will: Weigh daily and monitor for increased swelling or shortness of breath Notify office if she gains more than 3lbs in one day or 5lbs in one week Recheck labs to assess kidney function at nephrology appointment 03/31/2021  Neuropathy Pharmacist Clinical Goal(s) Over the next 90 days, patient will work with PharmD and providers to reduce symptoms associated with neuropathy Current regimen:  Lyrica 100mg  daily at bedtime Nortriptyline 10mg  daily Interventions:  Checked for patient assistance for Lyrica - there are no current programs for Lyrica. Also checked GoodRx price for Brand Lyrica and is $800 so patient's price through Medicare is lower. She does not want to consider generic pregabalin (cost would be $30/90 days) Patient self care activities - Over the next 90 days, patient will: Continue to follow up with  Dr Jannifer Franklin Continue current therapy  Osteoporosis Pharmacist Clinical Goal(s) Over the next 90 days, patient will work with PharmD and providers to reduce risk of  fracture associated with osteoporosis Current regimen:  Calcium Carbonate 600mg   - take 1 tablet twice a day  Vitamin D 2000 units once daily Interventions Consider repeat DEXA Scan Patient self care activities - Over the next 90 days, patient will: Consider repeat DEXA Scan Fall prevention Continue vitamin D 2000 units daily  Health Maintenance  Pharmacist Clinical Goal(s) Over the next 180 days, patient will work with PharmD and providers to complete health maintenance screenings/vaccinations Interventions: Complete Shingrix vaccine series and Tdap - considering in 2023 Consider Bivalent Covid Booster  Medication management Pharmacist Clinical Goal(s): Over the next 90 days, patient will work with PharmD and providers to maintain optimal medication adherence [facility] Interventions Comprehensive medication review performed. Continue current medication management strategy Patient self care activities - Over the next 90 days, patient will: Focus on medication adherence by filling and taking medications appropriately  Take medications as prescribed Report any questions or concerns to PharmD and/or provider(s)  The patient verbalized understanding of instructions, educational materials, and care plan provided today and agreed to receive a mailed copy of patient instructions, educational materials, and care plan.

## 2021-03-24 ENCOUNTER — Other Ambulatory Visit: Payer: Self-pay | Admitting: Neurology

## 2021-03-24 MED ORDER — LYRICA 100 MG PO CAPS: 100.0000 mg | ORAL_CAPSULE | Freq: Every day | ORAL | 1 refills | Status: DC

## 2021-03-24 NOTE — Telephone Encounter (Signed)
Pt is requesting a refill for LYRICA 100 MG capsule.  Pharmacy:  [place] [phone]

## 2021-03-26 ENCOUNTER — Ambulatory Visit (INDEPENDENT_AMBULATORY_CARE_PROVIDER_SITE_OTHER): Payer: Medicare Other | Admitting: *Deleted

## 2021-03-26 DIAGNOSIS — E538 Deficiency of other specified B group vitamins: Secondary | ICD-10-CM | POA: Diagnosis not present

## 2021-03-26 MED ORDER — CYANOCOBALAMIN 1000 MCG/ML IJ SOLN
1000.0000 ug | Freq: Once | INTRAMUSCULAR | Status: AC
  Administered 2021-03-26: 16:00:00 1000 ug via INTRAMUSCULAR

## 2021-03-26 NOTE — Progress Notes (Signed)
Krystal Henderson is a 87 y.o. female presents to the office today to start b12 injection, per physician's orders.  Today is the four of four weekly injections.  Original order from 02/12/21 lab: B12 is low--- she would benefit from b12 injections weekly x4 then monthly.   B12 1066mcg was administered in left deltoid today. Patient tolerated injection.   Patient due for follow up labs/provider appt: Yes. Date due:08/13/21, appt made Yes   Patient next injection due: 04/24/21 , appt made Yes

## 2021-04-04 DIAGNOSIS — I509 Heart failure, unspecified: Secondary | ICD-10-CM

## 2021-04-04 DIAGNOSIS — E785 Hyperlipidemia, unspecified: Secondary | ICD-10-CM

## 2021-04-04 DIAGNOSIS — I10 Essential (primary) hypertension: Secondary | ICD-10-CM

## 2021-04-04 DIAGNOSIS — I480 Paroxysmal atrial fibrillation: Secondary | ICD-10-CM

## 2021-04-04 DIAGNOSIS — N184 Chronic kidney disease, stage 4 (severe): Secondary | ICD-10-CM

## 2021-04-04 DIAGNOSIS — E782 Mixed hyperlipidemia: Secondary | ICD-10-CM

## 2021-04-09 ENCOUNTER — Ambulatory Visit: Payer: Medicare Other | Admitting: Neurology

## 2021-04-09 ENCOUNTER — Encounter: Payer: Self-pay | Admitting: Neurology

## 2021-04-09 VITALS — BP 152/82 | HR 88 | Ht 63.0 in

## 2021-04-09 DIAGNOSIS — G8929 Other chronic pain: Secondary | ICD-10-CM | POA: Diagnosis not present

## 2021-04-09 DIAGNOSIS — G609 Hereditary and idiopathic neuropathy, unspecified: Secondary | ICD-10-CM

## 2021-04-09 DIAGNOSIS — M545 Low back pain, unspecified: Secondary | ICD-10-CM

## 2021-04-09 MED ORDER — LYRICA 100 MG PO CAPS: 100.0000 mg | ORAL_CAPSULE | Freq: Every day | ORAL | 1 refills | Status: DC

## 2021-04-09 NOTE — Progress Notes (Signed)
PATIENT: Krystal Henderson DOB: 07-14-1932  REASON FOR VISIT: Follow up for peripheral neuropathy  HISTORY FROM: Patient PRIMARY NEUROLOGIST: Dr. Krista Blue since Dr. Jannifer Franklin retired   HISTORY OF PRESENT ILLNESS: Today 04/09/21 Krystal Henderson here today for follow-up with history of peripheral neuropathy and low back pain.  Is on brand-name Lyrica and nortriptyline for her symptoms. Sleeps well, has daytime drowsiness. Has cold sensation in feet at night, nothing during the day. 1 fall, when her leg got hung on the bathtub about 2 months ago, wasn't injured. PCP is doing B 12 injections. Takes Xanax PRN,  but not daily. Uses rolling walker at home. Her husband brought her today.   HISTORY 09/09/2020 Dr. Jannifer Franklin: Krystal Henderson is an 88 year old right-handed white female with a history of a peripheral neuropathy.  The patient does have some discomfort in the feet but the Lyrica seems to help this fairly well.  She is mainly bothered by low back pain that is worse with standing, and seems to limit how far she can walk.  She has been diagnosed as well with atrial fibrillation, she is on chronic anticoagulation.  The patient feels much better with her back when she lies down, she sleeps fairly well at night.  She uses a walker inside and outside the house, she has not had any falls.  She uses brand-name Lyrica, the generic medication was not effective for her.  She has some ankle swelling on the medication.   REVIEW OF SYSTEMS: Out of a complete 14 system review of symptoms, the patient complains only of the following symptoms, and all other reviewed systems are negative.  See HPI  ALLERGIES: Allergies  Allergen Reactions   Codeine Nausea Only   Duloxetine Other (See Comments)    REACTION: intolerance--She does not remember taking this Rx- states she did not feel well    Sertraline Hcl Other (See Comments)    REACTION: intolerance-- Did not help with Depression    HOME MEDICATIONS: Outpatient  Medications Prior to Visit  Medication Sig Dispense Refill   ALPRAZolam (XANAX) 0.25 MG tablet Take 2-4 tablets (0.5-1 mg total) by mouth at bedtime as needed. Anxiety 30 tablet 1   amLODipine (NORVASC) 5 MG tablet Take 1 tablet (5 mg total) by mouth daily. 90 tablet 1   Ascorbic Acid (VITAMIN C) 1000 MG tablet Take 1,000 mg by mouth daily.     Calcium Carbonate (CALCIUM 600 PO) Take 1 capsule by mouth daily.     Cholecalciferol (VITAMIN D) 2000 UNITS CAPS Take 1 capsule by mouth daily.     diltiazem (CARDIZEM CD) 120 MG 24 hr capsule Take 1 capsule (120 mg total) by mouth daily. 30 capsule 6   ELIQUIS 2.5 MG TABS tablet TAKE 1 TABLET(2.5 MG) BY MOUTH TWICE DAILY 60 tablet 5   furosemide (LASIX) 40 MG tablet Take 1 tablet (40 mg total) by mouth daily. 90 tablet 3   losartan (COZAAR) 100 MG tablet Take 1 tablet (100 mg total) by mouth daily. 90 tablet 1   Polyethyl Glycol-Propyl Glycol (SYSTANE ULTRA OP) Apply 1 drop to eye daily as needed (for dry eyes).     LYRICA 100 MG capsule Take 1 capsule (100 mg total) by mouth at bedtime. 90 capsule 1   nortriptyline (PAMELOR) 10 MG capsule Take 1 capsule (10 mg total) by mouth at bedtime. 90 capsule 3   No facility-administered medications prior to visit.    PAST MEDICAL HISTORY: Past Medical History:  Diagnosis Date  Anemia    hx of    Angiodysplasia 2007   @ colonoscopy   Anxiety    PMH of   Chronic low back pain 06/21/2014   Diverticulosis of colon (without mention of hemorrhage)    DJD (degenerative joint disease)    Esophageal reflux    inactive   History of vertebral fracture 11/2015   HTN (hypertension)    Hyperlipemia 2006   LDL 130   Hypoglycemia    reactive   Pelvic fracture ([place]) 12/30/2011   [facility]   Peripheral neuropathy    Personal history of colonic polyps    adenomatous   Vitamin B12 deficiency     PAST SURGICAL HISTORY: Past Surgical History:  Procedure Laterality Date   cataract surgery  12-26-12 and  01-09-13   COLONOSCOPY  2011   neg   COLONOSCOPY W/ POLYPECTOMY     X 2 , Dr  Verl Blalock; angiodysplasia. Due 2022   DILATION AND CURETTAGE OF UTERUS     FACIAL COSMETIC SURGERY     HEMORROIDECTOMY     LUMBAR LAMINECTOMY/DECOMPRESSION MICRODISCECTOMY  09/10/2011   Procedure: LUMBAR LAMINECTOMY/DECOMPRESSION MICRODISCECTOMY;  Surgeon: Johnn Hai, MD;  Location: [facility];  Service: Orthopedics;  Laterality: N/A;  decompression laminectomy L2-3, L3-4, L4-5   ORIF WRIST FRACTURE  01/11/2012   Procedure: OPEN REDUCTION INTERNAL FIXATION (ORIF) WRIST FRACTURE;  Surgeon: Tennis Must, MD;  Location: [facility];  Service: Orthopedics;  Laterality: Right;   TEAR DUCT PROBING     X 2   TOTAL HIP ARTHROPLASTY  2000   right   TUBAL LIGATION      FAMILY HISTORY: Family History  Problem Relation Age of Onset   Heart attack Father 58   Kidney disease Mother        renal failure   Throat cancer Sister        smoker   Osteoporosis Sister    Heart attack Brother 75   Stroke Brother 61       smoker   Depression Maternal Uncle    Cancer Brother        X3  lung cancer, all smokers   Peripheral vascular disease Daughter    Colon cancer Neg Hx    Diabetes Neg Hx     SOCIAL HISTORY: Social History   Socioeconomic History   Marital status: Married    Spouse name: Krystal Henderson   Number of children: 1   Years of education: hs   Highest education level: Not on file  Occupational History   Occupation: retired    Fish farm manager: RETIRED  Tobacco Use   Smoking status: Never   Smokeless tobacco: Never  Substance and Sexual Activity   Alcohol use: Yes    Alcohol/week: 3.0 standard drinks    Types: 3 Glasses of wine per week    Comment: wine occasionally   Drug use: No   Sexual activity: Yes    Birth control/protection: Diaphragm  Other Topics Concern   Not on file  Social History Narrative   Patient is right handed.   Patient does not drink caffeine.   Lives with husband    Krystal Henderson lives nearby   Social Determinants of Health   Financial Resource Strain: Low Risk    Difficulty of Paying Living Expenses: Not very hard  Food Insecurity: No Food Insecurity   Worried About Charity fundraiser in the Last Year: Never true   [employer] of Food in the Last Year:  Never true  Transportation Needs: No Transportation Needs   Lack of Transportation (Medical): No   Lack of Transportation (Non-Medical): No  Physical Activity: Insufficiently Active   Days of Exercise per Week: 6 days   Minutes of Exercise per Session: 10 min  Stress: Stress Concern Present   Feeling of Stress : To some extent  Social Connections: Moderately Isolated   Frequency of Communication with Friends and Family: More than three times a week   Frequency of Social Gatherings with Friends and Family: More than three times a week   Attends Religious Services: Never   Marine scientist or Organizations: No   Attends Archivist Meetings: Never   Marital Status: Married  Human resources officer Violence: Not At Risk   Fear of Current or Ex-Partner: No   Emotionally Abused: No   Physically Abused: No   Sexually Abused: No   PHYSICAL EXAM  Vitals:   04/09/21 1451  BP: (!) 152/82  Pulse: 88  Height: 5\' 3"  (1.6 m)   Body mass index is 29.72 kg/m.  Generalized: Well developed, in no acute distress, elderly female, in wheelchair, brought her walker, long walk back to room Neurological examination  Mentation: Alert oriented to time, place, history taking. Follows all commands speech and language fluent Cranial nerve II-XII: Pupils were equal round reactive to light. Extraocular movements were full, visual field were full on confrontational test. Facial sensation and strength were normal. Head turning and shoulder shrug  were normal and symmetric. Motor: Good strength all extremities Sensory: Sensory testing is intact to soft touch on all 4 extremities. No evidence of extinction is noted.   Coordination: Cerebellar testing reveals good finger-nose-finger bilaterally, has difficulty with heel-to-shin lifting legs Gait and station: Has to push off from seated position, slow to rise, is able to ambulate fairly well with a walker, she is forward leaning Reflexes: Deep tendon reflexes are symmetric   DIAGNOSTIC DATA (LABS, IMAGING, TESTING) - I reviewed patient records, labs, notes, testing and imaging myself where available.  Lab Results  Component Value Date   WBC 5.9 02/12/2021   HGB 11.9 (L) 02/12/2021   HCT 37.2 02/12/2021   MCV 92.2 02/12/2021   PLT 357.0 02/12/2021      Component Value Date/Time   NA 138 02/12/2021 1448   K 4.6 02/12/2021 1448   CL 102 02/12/2021 1448   CO2 24 02/12/2021 1448   GLUCOSE 88 02/12/2021 1448   BUN 32 (H) 02/12/2021 1448   CREATININE 2.50 (H) 02/12/2021 1448   CREATININE 2.09 (H) 01/03/2020 1605   CALCIUM 10.1 02/12/2021 1448   PROT 7.3 02/12/2021 1448   ALBUMIN 4.3 02/12/2021 1448   AST 11 02/12/2021 1448   ALT 10 02/12/2021 1448   ALKPHOS 59 02/12/2021 1448   BILITOT 0.6 02/12/2021 1448   GFRNONAA 25 (L) 11/12/2019 0334   GFRAA 29 (L) 11/12/2019 0334   Lab Results  Component Value Date   CHOL 230 (H) 02/12/2021   HDL 87.30 02/12/2021   LDLCALC 123 (H) 02/12/2021   LDLDIRECT 129.0 10/18/2014   TRIG 96.0 02/12/2021   CHOLHDL 3 02/12/2021   Lab Results  Component Value Date   HGBA1C 5.7 (H) 11/09/2019   Lab Results  Component Value Date   VITAMINB12 179 (L) 02/12/2021   Lab Results  Component Value Date   TSH 2.432 11/09/2019   ASSESSMENT AND PLAN 88 y.o. year old female  has a past medical history of Anemia, Angiodysplasia (2007), Anxiety, Chronic  low back pain (06/21/2014), Diverticulosis of colon (without mention of hemorrhage), DJD (degenerative joint disease), Esophageal reflux, History of vertebral fracture (11/2015), HTN (hypertension), Hyperlipemia (2006), Hypoglycemia, Pelvic fracture ([place]) (12/30/2011),  Peripheral neuropathy, Personal history of colonic polyps, and Vitamin B12 deficiency. here with:  1.  Peripheral neuropathy 2.  Gait instability 3.  Chronic low back pain  -Pain is under good control, stable -Will try to stop nortriptyline to see if less drowsiness during the day, has tried before but wants to try again (decided to stop this vs reducing dose of Lyrica) -Will continue the Lyrica for now, but may consider a dose reduction in the future if pain continues to be well controlled (on 100 mg now, try 75 mg in future), Her pain worsened on generic Lyrica -Return in 6 months to see how doing with medication adjustment (VV is okay), if remains stable will return back to primary care, return here PRN, will be followed by Dr. Krista Blue since Dr. Jannifer Franklin has retired   Butler Denmark, AGNP-C, [place] 04/09/2021, 3:41 PM [facility] [address] [place], [place] [address] [phone]

## 2021-04-09 NOTE — Patient Instructions (Addendum)
Stop the nortriptyline to see if less sleepy, take 1 every other night for 1 week then stop  Continue the Lyrica for now  See you back in 6 months

## 2021-04-09 NOTE — Progress Notes (Signed)
Chart reviewed, agree above plan ?

## 2021-04-12 ENCOUNTER — Encounter: Payer: Self-pay | Admitting: Cardiology

## 2021-04-14 ENCOUNTER — Encounter: Payer: Self-pay | Admitting: Cardiology

## 2021-04-14 ENCOUNTER — Other Ambulatory Visit: Payer: Self-pay | Admitting: Family Medicine

## 2021-04-14 DIAGNOSIS — I10 Essential (primary) hypertension: Secondary | ICD-10-CM

## 2021-04-14 DIAGNOSIS — C44722 Squamous cell carcinoma of skin of right lower limb, including hip: Secondary | ICD-10-CM | POA: Diagnosis not present

## 2021-04-15 ENCOUNTER — Telehealth: Payer: Self-pay | Admitting: Cardiology

## 2021-04-15 NOTE — Telephone Encounter (Signed)
Patient's spouse called stating that he had her medication filled yesterday, and the pharmacy gave him metoprolol.  He wants to know if Dr. Curt Bears is putting her back on it or if it was an error on part of the pharmacy

## 2021-04-15 NOTE — Telephone Encounter (Signed)
Pt advised to follow up with pharmacy and that we did not send in Rx. Aware mistake on pharmacy part. Pt will call them and appreciates my call.

## 2021-04-21 DIAGNOSIS — C44619 Basal cell carcinoma of skin of left upper limb, including shoulder: Secondary | ICD-10-CM | POA: Diagnosis not present

## 2021-04-21 DIAGNOSIS — L821 Other seborrheic keratosis: Secondary | ICD-10-CM | POA: Diagnosis not present

## 2021-04-21 DIAGNOSIS — C44612 Basal cell carcinoma of skin of right upper limb, including shoulder: Secondary | ICD-10-CM | POA: Diagnosis not present

## 2021-04-24 ENCOUNTER — Ambulatory Visit (INDEPENDENT_AMBULATORY_CARE_PROVIDER_SITE_OTHER): Payer: Medicare Other

## 2021-04-24 DIAGNOSIS — E538 Deficiency of other specified B group vitamins: Secondary | ICD-10-CM

## 2021-04-24 MED ORDER — CYANOCOBALAMIN 1000 MCG/ML IJ SOLN
1000.0000 ug | Freq: Once | INTRAMUSCULAR | Status: AC
  Administered 2021-04-24: 1000 ug via INTRAMUSCULAR

## 2021-04-24 NOTE — Progress Notes (Signed)
Krystal Henderson is a 88 y.o. female presents to the office today for her monthly b12 injection, per physician's orders.   Original order from 02/12/21 lab: B12 is low--- she would benefit from b12 injections weekly x4 then monthly. Patient has complete the weekly injections and today was her first monthly injection.  B12 1058mcg was administered in Right  deltoid today. Patient tolerated injection.   Patient due for follow up labs/provider appt: Yes. Date due:08/13/21, appt made Yes   Patient next injection due: 05/29/2021, appt made, YES scheduled.

## 2021-04-30 DIAGNOSIS — M4800 Spinal stenosis, site unspecified: Secondary | ICD-10-CM | POA: Diagnosis not present

## 2021-04-30 DIAGNOSIS — E785 Hyperlipidemia, unspecified: Secondary | ICD-10-CM | POA: Diagnosis not present

## 2021-04-30 DIAGNOSIS — N189 Chronic kidney disease, unspecified: Secondary | ICD-10-CM | POA: Diagnosis not present

## 2021-04-30 DIAGNOSIS — I5030 Unspecified diastolic (congestive) heart failure: Secondary | ICD-10-CM | POA: Diagnosis not present

## 2021-04-30 DIAGNOSIS — N2581 Secondary hyperparathyroidism of renal origin: Secondary | ICD-10-CM | POA: Diagnosis not present

## 2021-04-30 DIAGNOSIS — G629 Polyneuropathy, unspecified: Secondary | ICD-10-CM | POA: Diagnosis not present

## 2021-04-30 DIAGNOSIS — N184 Chronic kidney disease, stage 4 (severe): Secondary | ICD-10-CM | POA: Diagnosis not present

## 2021-05-14 ENCOUNTER — Other Ambulatory Visit: Payer: Self-pay | Admitting: Cardiology

## 2021-05-14 DIAGNOSIS — I480 Paroxysmal atrial fibrillation: Secondary | ICD-10-CM

## 2021-05-14 NOTE — Telephone Encounter (Signed)
Eliquis 2.5mg  refill request received. Patient is 88 years old, weight-76.1kg, Crea-2.50 on 02/12/2021, Diagnosis-Afib, and last seen by Dr. Curt Bears on 11/24/2020. Dose is appropriate based on dosing criteria. Will send in refill to requested pharmacy.

## 2021-05-24 NOTE — Progress Notes (Unsigned)
Cardiology Office Note Date:  05/24/2021  Patient ID:  Krystal Henderson, Krystal Henderson December 13, 1932, MRN [id number] PCP:  Carollee Herter, Alferd Apa, DO  Electrophysiologist: Dr. Curt Bears  ***refresh   Chief Complaint: *** 6 mo  History of Present Illness: Krystal Henderson is a 88 y.o. female with history of HTN, HLD, PE, AFib  She comes in today to be seen for Dr. Curt Bears, last seen by him Aug 2022, she was c/o fatigue, sleeping a lot.  Her Metoprolol stopped and changed to dilt to see if her fatigue improved.  *** symptoms *** fatigue, apnea? Update echo, 45-50% in 2021, post PE/AF *** Eliquis, dose, bleeding, labs *** AF burden by symptoms? Amio?   AFib Hx Diagnosed Aug 2021 (post PE) AAD Hx None to date  Past Medical History:  Diagnosis Date   Anemia    hx of    Angiodysplasia 2007   @ colonoscopy   Anxiety    PMH of   Chronic low back pain 06/21/2014   Diverticulosis of colon (without mention of hemorrhage)    DJD (degenerative joint disease)    Esophageal reflux    inactive   History of vertebral fracture 11/2015   HTN (hypertension)    Hyperlipemia 2006   LDL 130   Hypoglycemia    reactive   Pelvic fracture ([place]) 12/30/2011   [facility]   Peripheral neuropathy    Personal history of colonic polyps    adenomatous   Vitamin B12 deficiency     Past Surgical History:  Procedure Laterality Date   cataract surgery  12-26-12 and 01-09-13   COLONOSCOPY  2011   neg   COLONOSCOPY W/ POLYPECTOMY     X 2 , Dr  Verl Blalock; angiodysplasia. Due 2022   DILATION AND CURETTAGE OF UTERUS     FACIAL COSMETIC SURGERY     HEMORROIDECTOMY     LUMBAR LAMINECTOMY/DECOMPRESSION MICRODISCECTOMY  09/10/2011   Procedure: LUMBAR LAMINECTOMY/DECOMPRESSION MICRODISCECTOMY;  Surgeon: Johnn Hai, MD;  Location: [facility];  Service: Orthopedics;  Laterality: N/A;  decompression laminectomy L2-3, L3-4, L4-5   ORIF WRIST FRACTURE  01/11/2012   Procedure: OPEN REDUCTION INTERNAL FIXATION  (ORIF) WRIST FRACTURE;  Surgeon: Tennis Must, MD;  Location: [facility];  Service: Orthopedics;  Laterality: Right;   TEAR DUCT PROBING     X 2   TOTAL HIP ARTHROPLASTY  2000   right   TUBAL LIGATION      Current Outpatient Medications  Medication Sig Dispense Refill   ELIQUIS 2.5 MG TABS tablet TAKE 1 TABLET(2.5 MG) BY MOUTH TWICE DAILY 60 tablet 5   ALPRAZolam (XANAX) 0.25 MG tablet Take 2-4 tablets (0.5-1 mg total) by mouth at bedtime as needed. Anxiety 30 tablet 1   amLODipine (NORVASC) 5 MG tablet TAKE 1 TABLET(5 MG) BY MOUTH DAILY 90 tablet 1   Ascorbic Acid (VITAMIN C) 1000 MG tablet Take 1,000 mg by mouth daily.     Calcium Carbonate (CALCIUM 600 PO) Take 1 capsule by mouth daily.     Cholecalciferol (VITAMIN D) 2000 UNITS CAPS Take 1 capsule by mouth daily.     diltiazem (CARDIZEM CD) 120 MG 24 hr capsule TAKE 1 CAPSULE(120 MG) BY MOUTH DAILY 30 capsule 7   furosemide (LASIX) 40 MG tablet Take 1 tablet (40 mg total) by mouth daily. 90 tablet 3   losartan (COZAAR) 100 MG tablet TAKE 1 TABLET BY MOUTH EVERY DAY 90 tablet 1   LYRICA 100 MG capsule  Take 1 capsule (100 mg total) by mouth at bedtime. 90 capsule 1   Polyethyl Glycol-Propyl Glycol (SYSTANE ULTRA OP) Apply 1 drop to eye daily as needed (for dry eyes).     No current facility-administered medications for this visit.    Allergies:   Codeine, Duloxetine, and Sertraline hcl   Social History:  The patient  reports that she has never smoked. She has never used smokeless tobacco. She reports current alcohol use of about 3.0 standard drinks per week. She reports that she does not use drugs.   Family History:  The patient's family history includes Cancer in her brother; Depression in her maternal uncle; Heart attack (age of onset: 78) in her brother; Heart attack (age of onset: 45) in her father; Kidney disease in her mother; Osteoporosis in her sister; Peripheral vascular disease in her daughter; Stroke (age of  onset: 16) in her brother; Throat cancer in her sister.  ROS:  Please see the history of present illness.    All other systems are reviewed and otherwise negative.   PHYSICAL EXAM:  VS:  There were no vitals taken for this visit. BMI: There is no height or weight on file to calculate BMI. Well nourished, well developed, in no acute distress HEENT: normocephalic, atraumatic Neck: no JVD, carotid bruits or masses Cardiac:  *** RRR; no significant murmurs, no rubs, or gallops Lungs:  *** CTA b/l, no wheezing, rhonchi or rales Abd: soft, nontender MS: no deformity or *** atrophy Ext: *** no edema Skin: warm and dry, no rash Neuro:  No gross deficits appreciated Psych: euthymic mood, full affect   EKG:  Done today and reviewed by myself shows  ***  Nov 2022, monitor Max 174 bpm 06:30am, 10/25 Min 36 bpm 11:57pm, 11/03 Avg 61 bpm <1% ventricular ectopy 12.3% supraventricular ectopy Predominant underlying rhythm was sinus rhythm 5% atrial fibrillation burden 28 beat run of NSVT No symptoms recorded   11/09/2019: TTE IMPRESSIONS   1. Mild global reduction in LV systolic function; grade 1 diastolic  dysfunction; right heart not well visualized.   2. Left ventricular ejection fraction, by estimation, is 45 to 50%. The  left ventricle has mildly decreased function. The left ventricle  demonstrates global hypokinesis. Left ventricular diastolic parameters are  consistent with Grade I diastolic  dysfunction (impaired relaxation). Elevated left atrial pressure.   3. Right ventricular systolic function is normal. The right ventricular  size is normal. Tricuspid regurgitation signal is inadequate for assessing  PA pressure.   4. Left atrial size was moderately dilated.   5. The mitral valve is normal in structure. Mild mitral valve  regurgitation. No evidence of mitral stenosis.   6. The aortic valve is tricuspid. Aortic valve regurgitation is mild. No  aortic stenosis is present.    7. Aortic dilatation noted. There is mild dilatation of the aortic root  measuring 39 mm.   8. The inferior vena cava is normal in size with greater than 50%  respiratory variability, suggesting right atrial pressure of 3 mmHg.    Recent Labs: 02/12/2021: ALT 10; BUN 32; Creatinine, Ser 2.50; Hemoglobin 11.9; Platelets 357.0; Potassium 4.6; Sodium 138  02/12/2021: Cholesterol 230; HDL 87.30; LDL Cholesterol 123; Total CHOL/HDL Ratio 3; Triglycerides 96.0; VLDL 19.2   CrCl cannot be calculated (Patient's most recent lab result is older than the maximum 21 days allowed.).   Wt Readings from Last 3 Encounters:  02/12/21 167 lb 12.8 oz (76.1 kg)  12/27/20 176 lb (79.8 kg)  11/24/20 174 lb 12.8 oz (79.3 kg)     Other studies reviewed: Additional studies/records reviewed today include: summarized above  ASSESSMENT AND PLAN:  Paroxysmal Afib CHA2DS2Vasc is 6, on Eliquis, *** appropriately dosed ***  Mild CM (presumed NICM) Echo done in the environment of a PE and Afib ***  HTN ***  Disposition: F/u with ***  Current medicines are reviewed at length with the patient today.  The patient did not have any concerns regarding medicines.  Krystal Night, PA-C 05/24/2021 3:27 PM     [address] [place] [address] [phone] (office)  [phone] (fax)

## 2021-05-26 ENCOUNTER — Ambulatory Visit: Payer: Medicare Other | Admitting: Physician Assistant

## 2021-05-27 ENCOUNTER — Ambulatory Visit: Payer: Medicare Other | Admitting: Physician Assistant

## 2021-05-29 ENCOUNTER — Ambulatory Visit (INDEPENDENT_AMBULATORY_CARE_PROVIDER_SITE_OTHER): Payer: Medicare Other

## 2021-05-29 DIAGNOSIS — E538 Deficiency of other specified B group vitamins: Secondary | ICD-10-CM | POA: Diagnosis not present

## 2021-05-29 MED ORDER — CYANOCOBALAMIN 1000 MCG/ML IJ SOLN
1000.0000 ug | Freq: Once | INTRAMUSCULAR | Status: AC
  Administered 2021-05-29: 1000 ug via INTRAMUSCULAR

## 2021-05-29 NOTE — Progress Notes (Signed)
Krystal Henderson is a 88 y.o. female presents to the office today for Monthly B12 injection, per physician's orders. Original order: 02/12/21 lab: B12 is low--- she would benefit from b12 injections weekly x4 then monthly. B12 1000 mcg, IM was administered R Deltoid (location) today. Patient tolerated injection. Patient due for follow up labs/provider appt: Yes. Date due: Pending- 08/13/21 Patient next injection due: 1 month, appt made Yes  Pt asks 1. If her fluid pill can be reduced back to 20 mg due to increase in urination. 2. Also asks if the B12 injections are needed because she has not noticed any improvements in her energy.    [place], Darlis Loan

## 2021-06-02 DIAGNOSIS — Z08 Encounter for follow-up examination after completed treatment for malignant neoplasm: Secondary | ICD-10-CM | POA: Diagnosis not present

## 2021-06-02 DIAGNOSIS — D485 Neoplasm of uncertain behavior of skin: Secondary | ICD-10-CM | POA: Diagnosis not present

## 2021-06-02 DIAGNOSIS — L821 Other seborrheic keratosis: Secondary | ICD-10-CM | POA: Diagnosis not present

## 2021-06-02 DIAGNOSIS — C44722 Squamous cell carcinoma of skin of right lower limb, including hip: Secondary | ICD-10-CM | POA: Diagnosis not present

## 2021-06-02 DIAGNOSIS — Z85828 Personal history of other malignant neoplasm of skin: Secondary | ICD-10-CM | POA: Diagnosis not present

## 2021-06-03 ENCOUNTER — Telehealth: Payer: Self-pay | Admitting: Family Medicine

## 2021-06-03 NOTE — Telephone Encounter (Signed)
Patient states her lasix medication is too strong and would like to lower her dosage. Patient stated it made her pee all over herself. Please advise.  ?

## 2021-06-03 NOTE — Telephone Encounter (Signed)
Please advise. Pt last seen in November 22 and has appt scheduled for May ?

## 2021-06-04 NOTE — Telephone Encounter (Signed)
Spoke with patient's husband and gave below recommendation. He states he will given pt the message and will call back if this doesn't help  ?

## 2021-06-11 DIAGNOSIS — M542 Cervicalgia: Secondary | ICD-10-CM | POA: Diagnosis not present

## 2021-06-11 DIAGNOSIS — M25511 Pain in right shoulder: Secondary | ICD-10-CM | POA: Diagnosis not present

## 2021-06-21 ENCOUNTER — Encounter: Payer: Self-pay | Admitting: Family Medicine

## 2021-06-22 ENCOUNTER — Encounter: Payer: Self-pay | Admitting: Family Medicine

## 2021-06-22 ENCOUNTER — Telehealth: Payer: Self-pay | Admitting: Family Medicine

## 2021-06-22 ENCOUNTER — Telehealth: Payer: Medicare Other

## 2021-06-22 ENCOUNTER — Ambulatory Visit (INDEPENDENT_AMBULATORY_CARE_PROVIDER_SITE_OTHER): Payer: Medicare Other | Admitting: Family Medicine

## 2021-06-22 VITALS — BP 112/60 | HR 84 | Temp 97.6°F | Ht 65.0 in | Wt 161.0 lb

## 2021-06-22 DIAGNOSIS — I509 Heart failure, unspecified: Secondary | ICD-10-CM | POA: Diagnosis not present

## 2021-06-22 DIAGNOSIS — E559 Vitamin D deficiency, unspecified: Secondary | ICD-10-CM | POA: Diagnosis not present

## 2021-06-22 DIAGNOSIS — E538 Deficiency of other specified B group vitamins: Secondary | ICD-10-CM

## 2021-06-22 DIAGNOSIS — I480 Paroxysmal atrial fibrillation: Secondary | ICD-10-CM

## 2021-06-22 DIAGNOSIS — R531 Weakness: Secondary | ICD-10-CM | POA: Diagnosis not present

## 2021-06-22 DIAGNOSIS — R5383 Other fatigue: Secondary | ICD-10-CM | POA: Diagnosis not present

## 2021-06-22 DIAGNOSIS — N184 Chronic kidney disease, stage 4 (severe): Secondary | ICD-10-CM

## 2021-06-22 MED ORDER — CYANOCOBALAMIN 1000 MCG/ML IJ SOLN
1000.0000 ug | Freq: Once | INTRAMUSCULAR | Status: AC
  Administered 2021-06-22: 1000 ug via INTRAMUSCULAR

## 2021-06-22 NOTE — Telephone Encounter (Signed)
Pt's husband called this morning and was sent to triage. ?

## 2021-06-22 NOTE — Telephone Encounter (Signed)
Noted  

## 2021-06-22 NOTE — Telephone Encounter (Signed)
Pt's husband stated she has been very lethargic with dizziness and hot flashes. They were transferred to triage.  ?

## 2021-06-22 NOTE — Patient Instructions (Addendum)
Give [place] 2-3 business days to get the results of your labs back.  ? ?Keep the diet clean and stay active. ? ?Elevate your legs.  ? ?If labs are normal, please schedule an appointment with your heart doctor.  ? ?Keep an eye on your home weights every 1-2 days to ensure it does not go up.  ? ?Let us know if you need anything. ?

## 2021-06-22 NOTE — Progress Notes (Signed)
Chief Complaint  ?Patient presents with  ? Weakness  ?  Blurred vision ?  ? ? ?Subjective: ?Patient is a 88 y.o. female here for weakness. Here w family member.  ? ?Over the past week, the patient has had worsening fatigue and generalized weakness.  She uses a 4 wheeled walker at baseline and her family member is able to get around.  She has not fallen or hit her head.  She has not gained any weight or experienced more lower extremity swelling.  She is compliant with her medications reports no recent changes.  She has been eating and drinking normally.  She denies any fevers, pain, or recent illness.  She has a history of vitamin D deficiency and also vitamin B12 deficiency. ? ?Past Medical History:  ?Diagnosis Date  ? Anemia   ? hx of   ? Angiodysplasia 2007  ? @ colonoscopy  ? Anxiety   ? PMH of  ? Chronic low back pain 06/21/2014  ? Diverticulosis of colon (without mention of hemorrhage)   ? DJD (degenerative joint disease)   ? Esophageal reflux   ? inactive  ? History of vertebral fracture 11/2015  ? HTN (hypertension)   ? Hyperlipemia 2006  ? LDL 130  ? Hypoglycemia   ? reactive  ? Pelvic fracture ([place]) 12/30/2011  ? [facility]  ? Peripheral neuropathy   ? Personal history of colonic polyps   ? adenomatous  ? Vitamin B12 deficiency   ? ?Objective: ?BP 112/60   Pulse 84   Temp 97.6 ?F (36.4 ?C) (Oral)   Ht '5\' 5"'$  (1.651 m)   Wt 161 lb (73 kg)   SpO2 99%   BMI 26.79 kg/m?  ?General: Awake, appears stated age ?HEENT: HOH, MMM ?Heart: irreg irreg rhythm, reg rate, 2+ b/l LE edema tapering at prox tibia b/l ?Lungs: CTAB, no rales, wheezes or rhonchi. No accessory muscle use ?Psych: normal affect and mood ? ?Assessment and Plan: ?Fatigue, unspecified type - Plan: TSH, Comprehensive metabolic panel, CBC ? ?Generalized weakness - Plan: cyanocobalamin ((VITAMIN B-12)) injection 1,000 mcg ? ?Paroxysmal atrial fibrillation ([place]), Chronic ? ?CKD (chronic kidney disease), stage IV ([place]), Chronic ? ?Chronic  congestive heart failure, unspecified heart failure type ([place]), Chronic ? ?Vitamin D deficiency - Plan: VITAMIN D 25 Hydroxy (Vit-D Deficiency, Fractures) ? ?B12 deficiency - Plan: B12, cyanocobalamin ((VITAMIN B-12)) injection 1,000 mcg ? ?New problem, uncertain prog. Ck labs. Stay hydrated. Needs to reach out to cards as she may be having worsening baseline function from CHF. Doubt A fib contributory. Has not felt much better w B12 injections, but will ck that to be sure. Could consider PT given advanced age and likely deconditioning playing a potential role.  ?The patient and her family member voiced understanding and agreement to the plan. ? ?Shelda Pal, DO ?06/22/21  ?2:40 PM ? ? ? ? ?

## 2021-06-23 LAB — CBC
HCT: 33.6 % — ABNORMAL LOW (ref 36.0–46.0)
Hemoglobin: 10.7 g/dL — ABNORMAL LOW (ref 12.0–15.0)
MCHC: 31.9 g/dL (ref 30.0–36.0)
MCV: 91.2 fl (ref 78.0–100.0)
Platelets: 372 10*3/uL (ref 150.0–400.0)
RBC: 3.68 Mil/uL — ABNORMAL LOW (ref 3.87–5.11)
RDW: 20.2 % — ABNORMAL HIGH (ref 11.5–15.5)
WBC: 8.1 10*3/uL (ref 4.0–10.5)

## 2021-06-23 LAB — COMPREHENSIVE METABOLIC PANEL
ALT: 13 U/L (ref 0–35)
AST: 9 U/L (ref 0–37)
Albumin: 4 g/dL (ref 3.5–5.2)
Alkaline Phosphatase: 48 U/L (ref 39–117)
BUN: 36 mg/dL — ABNORMAL HIGH (ref 6–23)
CO2: 24 mEq/L (ref 19–32)
Calcium: 9.8 mg/dL (ref 8.4–10.5)
Chloride: 102 mEq/L (ref 96–112)
Creatinine, Ser: 2.53 mg/dL — ABNORMAL HIGH (ref 0.40–1.20)
GFR: 16.68 mL/min — ABNORMAL LOW (ref >60.00)
Glucose, Bld: 89 mg/dL (ref 70–99)
Potassium: 4.5 mEq/L (ref 3.5–5.1)
Sodium: 137 mEq/L (ref 135–145)
Total Bilirubin: 0.5 mg/dL (ref 0.2–1.2)
Total Protein: 6.6 g/dL (ref 6.0–8.3)

## 2021-06-23 LAB — VITAMIN B12: Vitamin B-12: >1504 pg/mL — ABNORMAL HIGH (ref 211–911)

## 2021-06-23 LAB — TSH: TSH: 3.13 u[IU]/mL (ref 0.35–5.50)

## 2021-06-23 LAB — VITAMIN D 25 HYDROXY (VIT D DEFICIENCY, FRACTURES): VITD: 71.11 ng/mL (ref 30.00–100.00)

## 2021-06-26 ENCOUNTER — Ambulatory Visit: Payer: Medicare Other

## 2021-06-30 DIAGNOSIS — H26491 Other secondary cataract, right eye: Secondary | ICD-10-CM | POA: Diagnosis not present

## 2021-06-30 DIAGNOSIS — H0100A Unspecified blepharitis right eye, upper and lower eyelids: Secondary | ICD-10-CM | POA: Diagnosis not present

## 2021-06-30 DIAGNOSIS — H04123 Dry eye syndrome of bilateral lacrimal glands: Secondary | ICD-10-CM | POA: Diagnosis not present

## 2021-07-01 ENCOUNTER — Encounter: Payer: Self-pay | Admitting: Family Medicine

## 2021-07-07 DIAGNOSIS — C44722 Squamous cell carcinoma of skin of right lower limb, including hip: Secondary | ICD-10-CM | POA: Diagnosis not present

## 2021-07-07 DIAGNOSIS — I872 Venous insufficiency (chronic) (peripheral): Secondary | ICD-10-CM | POA: Diagnosis not present

## 2021-07-21 DIAGNOSIS — S81801A Unspecified open wound, right lower leg, initial encounter: Secondary | ICD-10-CM | POA: Diagnosis not present

## 2021-07-22 ENCOUNTER — Emergency Department (HOSPITAL_COMMUNITY): Payer: Medicare Other

## 2021-07-22 ENCOUNTER — Other Ambulatory Visit: Payer: Self-pay

## 2021-07-22 ENCOUNTER — Inpatient Hospital Stay (HOSPITAL_COMMUNITY)
Admission: EM | Admit: 2021-07-22 | Discharge: 2021-08-01 | DRG: 291 | Disposition: A | Discharge status: Skilled Nursing Facility | Payer: Medicare Other | Attending: Internal Medicine | Admitting: Internal Medicine

## 2021-07-22 ENCOUNTER — Encounter (HOSPITAL_COMMUNITY): Payer: Self-pay

## 2021-07-22 DIAGNOSIS — K219 Gastro-esophageal reflux disease without esophagitis: Secondary | ICD-10-CM

## 2021-07-22 DIAGNOSIS — G629 Polyneuropathy, unspecified: Secondary | ICD-10-CM

## 2021-07-22 DIAGNOSIS — G4489 Other headache syndrome: Secondary | ICD-10-CM | POA: Diagnosis not present

## 2021-07-22 DIAGNOSIS — R531 Weakness: Principal | ICD-10-CM

## 2021-07-22 DIAGNOSIS — D649 Anemia, unspecified: Secondary | ICD-10-CM

## 2021-07-22 DIAGNOSIS — Z85828 Personal history of other malignant neoplasm of skin: Secondary | ICD-10-CM | POA: Diagnosis not present

## 2021-07-22 DIAGNOSIS — I5043 Acute on chronic combined systolic (congestive) and diastolic (congestive) heart failure: Secondary | ICD-10-CM | POA: Diagnosis present

## 2021-07-22 DIAGNOSIS — J189 Pneumonia, unspecified organism: Secondary | ICD-10-CM | POA: Diagnosis not present

## 2021-07-22 DIAGNOSIS — Z808 Family history of malignant neoplasm of other organs or systems: Secondary | ICD-10-CM

## 2021-07-22 DIAGNOSIS — D638 Anemia in other chronic diseases classified elsewhere: Secondary | ICD-10-CM | POA: Diagnosis present

## 2021-07-22 DIAGNOSIS — Z7901 Long term (current) use of anticoagulants: Secondary | ICD-10-CM

## 2021-07-22 DIAGNOSIS — Z801 Family history of malignant neoplasm of trachea, bronchus and lung: Secondary | ICD-10-CM | POA: Diagnosis not present

## 2021-07-22 DIAGNOSIS — E1122 Type 2 diabetes mellitus with diabetic chronic kidney disease: Secondary | ICD-10-CM | POA: Diagnosis not present

## 2021-07-22 DIAGNOSIS — N179 Acute kidney failure, unspecified: Secondary | ICD-10-CM | POA: Diagnosis present

## 2021-07-22 DIAGNOSIS — I509 Heart failure, unspecified: Secondary | ICD-10-CM | POA: Diagnosis not present

## 2021-07-22 DIAGNOSIS — G8929 Other chronic pain: Secondary | ICD-10-CM | POA: Diagnosis not present

## 2021-07-22 DIAGNOSIS — L089 Local infection of the skin and subcutaneous tissue, unspecified: Secondary | ICD-10-CM | POA: Diagnosis not present

## 2021-07-22 DIAGNOSIS — Z20822 Contact with and (suspected) exposure to covid-19: Secondary | ICD-10-CM | POA: Diagnosis not present

## 2021-07-22 DIAGNOSIS — I5023 Acute on chronic systolic (congestive) heart failure: Secondary | ICD-10-CM | POA: Diagnosis not present

## 2021-07-22 DIAGNOSIS — N184 Chronic kidney disease, stage 4 (severe): Secondary | ICD-10-CM

## 2021-07-22 DIAGNOSIS — Z8249 Family history of ischemic heart disease and other diseases of the circulatory system: Secondary | ICD-10-CM

## 2021-07-22 DIAGNOSIS — Z86711 Personal history of pulmonary embolism: Secondary | ICD-10-CM

## 2021-07-22 DIAGNOSIS — R2689 Other abnormalities of gait and mobility: Secondary | ICD-10-CM | POA: Diagnosis not present

## 2021-07-22 DIAGNOSIS — E785 Hyperlipidemia, unspecified: Secondary | ICD-10-CM | POA: Diagnosis not present

## 2021-07-22 DIAGNOSIS — Z823 Family history of stroke: Secondary | ICD-10-CM

## 2021-07-22 DIAGNOSIS — R0902 Hypoxemia: Secondary | ICD-10-CM | POA: Diagnosis present

## 2021-07-22 DIAGNOSIS — R6889 Other general symptoms and signs: Secondary | ICD-10-CM | POA: Diagnosis not present

## 2021-07-22 DIAGNOSIS — M6281 Muscle weakness (generalized): Secondary | ICD-10-CM | POA: Diagnosis not present

## 2021-07-22 DIAGNOSIS — D500 Iron deficiency anemia secondary to blood loss (chronic): Secondary | ICD-10-CM | POA: Diagnosis not present

## 2021-07-22 DIAGNOSIS — I130 Hypertensive heart and chronic kidney disease with heart failure and stage 1 through stage 4 chronic kidney disease, or unspecified chronic kidney disease: Secondary | ICD-10-CM | POA: Diagnosis not present

## 2021-07-22 DIAGNOSIS — R519 Headache, unspecified: Secondary | ICD-10-CM

## 2021-07-22 DIAGNOSIS — Z66 Do not resuscitate: Secondary | ICD-10-CM | POA: Diagnosis present

## 2021-07-22 DIAGNOSIS — I4891 Unspecified atrial fibrillation: Secondary | ICD-10-CM | POA: Diagnosis not present

## 2021-07-22 DIAGNOSIS — Z79899 Other long term (current) drug therapy: Secondary | ICD-10-CM

## 2021-07-22 DIAGNOSIS — K5909 Other constipation: Secondary | ICD-10-CM | POA: Diagnosis present

## 2021-07-22 DIAGNOSIS — I480 Paroxysmal atrial fibrillation: Secondary | ICD-10-CM

## 2021-07-22 DIAGNOSIS — S81801A Unspecified open wound, right lower leg, initial encounter: Secondary | ICD-10-CM | POA: Diagnosis not present

## 2021-07-22 DIAGNOSIS — J811 Chronic pulmonary edema: Secondary | ICD-10-CM | POA: Diagnosis not present

## 2021-07-22 DIAGNOSIS — R1311 Dysphagia, oral phase: Secondary | ICD-10-CM | POA: Diagnosis not present

## 2021-07-22 DIAGNOSIS — J90 Pleural effusion, not elsewhere classified: Secondary | ICD-10-CM | POA: Diagnosis not present

## 2021-07-22 DIAGNOSIS — M1990 Unspecified osteoarthritis, unspecified site: Secondary | ICD-10-CM

## 2021-07-22 DIAGNOSIS — E538 Deficiency of other specified B group vitamins: Secondary | ICD-10-CM | POA: Diagnosis not present

## 2021-07-22 DIAGNOSIS — I499 Cardiac arrhythmia, unspecified: Secondary | ICD-10-CM | POA: Diagnosis not present

## 2021-07-22 DIAGNOSIS — E8779 Other fluid overload: Secondary | ICD-10-CM | POA: Diagnosis not present

## 2021-07-22 DIAGNOSIS — R0609 Other forms of dyspnea: Secondary | ICD-10-CM | POA: Diagnosis not present

## 2021-07-22 DIAGNOSIS — I5032 Chronic diastolic (congestive) heart failure: Secondary | ICD-10-CM

## 2021-07-22 DIAGNOSIS — R627 Adult failure to thrive: Secondary | ICD-10-CM | POA: Diagnosis not present

## 2021-07-22 DIAGNOSIS — F419 Anxiety disorder, unspecified: Secondary | ICD-10-CM | POA: Diagnosis present

## 2021-07-22 DIAGNOSIS — Z743 Need for continuous supervision: Secondary | ICD-10-CM | POA: Diagnosis not present

## 2021-07-22 DIAGNOSIS — E876 Hypokalemia: Secondary | ICD-10-CM | POA: Diagnosis not present

## 2021-07-22 DIAGNOSIS — I10 Essential (primary) hypertension: Secondary | ICD-10-CM

## 2021-07-22 DIAGNOSIS — E1142 Type 2 diabetes mellitus with diabetic polyneuropathy: Secondary | ICD-10-CM | POA: Diagnosis not present

## 2021-07-22 DIAGNOSIS — T502X5A Adverse effect of carbonic-anhydrase inhibitors, benzothiadiazides and other diuretics, initial encounter: Secondary | ICD-10-CM | POA: Diagnosis not present

## 2021-07-22 DIAGNOSIS — R279 Unspecified lack of coordination: Secondary | ICD-10-CM | POA: Diagnosis not present

## 2021-07-22 DIAGNOSIS — R262 Difficulty in walking, not elsewhere classified: Secondary | ICD-10-CM | POA: Diagnosis not present

## 2021-07-22 DIAGNOSIS — K5900 Constipation, unspecified: Secondary | ICD-10-CM | POA: Diagnosis not present

## 2021-07-22 LAB — CBC WITH DIFFERENTIAL/PLATELET
Abs Immature Granulocytes: 0.02 10*3/uL (ref 0.00–0.07)
Basophils Absolute: 0.1 10*3/uL (ref 0.0–0.1)
Basophils Relative: 1 %
Eosinophils Absolute: 0.1 10*3/uL (ref 0.0–0.5)
Eosinophils Relative: 2 %
HCT: 27.7 % — ABNORMAL LOW (ref 36.0–46.0)
Hemoglobin: 8.9 g/dL — ABNORMAL LOW (ref 12.0–15.0)
Immature Granulocytes: 0 %
Lymphocytes Relative: 27 %
Lymphs Abs: 1.5 10*3/uL (ref 0.7–4.0)
MCH: 28.4 pg (ref 26.0–34.0)
MCHC: 32.1 g/dL (ref 30.0–36.0)
MCV: 88.5 fL (ref 80.0–100.0)
Monocytes Absolute: 0.8 10*3/uL (ref 0.1–1.0)
Monocytes Relative: 14 %
Neutro Abs: 3.2 10*3/uL (ref 1.7–7.7)
Neutrophils Relative %: 56 %
Platelets: 317 10*3/uL (ref 150–400)
RBC: 3.13 MIL/uL — ABNORMAL LOW (ref 3.87–5.11)
RDW: 17.8 % — ABNORMAL HIGH (ref 11.5–15.5)
WBC: 5.6 10*3/uL (ref 4.0–10.5)
nRBC: 0 % (ref 0.0–0.2)

## 2021-07-22 LAB — TROPONIN I (HIGH SENSITIVITY)
Troponin I (High Sensitivity): 6 ng/L (ref <18)
Troponin I (High Sensitivity): 6 ng/L (ref <18)
Troponin I (High Sensitivity): 6 ng/L (ref <18)

## 2021-07-22 LAB — PHOSPHORUS: Phosphorus: 5.9 mg/dL — ABNORMAL HIGH (ref 2.5–4.6)

## 2021-07-22 LAB — PROTIME-INR
INR: 1.7 — ABNORMAL HIGH (ref 0.8–1.2)
Prothrombin Time: 19.7 seconds — ABNORMAL HIGH (ref 11.4–15.2)

## 2021-07-22 LAB — RESP PANEL BY RT-PCR (FLU A&B, COVID) ARPGX2
Influenza A by PCR: NEGATIVE
Influenza B by PCR: NEGATIVE
SARS Coronavirus 2 by RT PCR: NEGATIVE

## 2021-07-22 LAB — BASIC METABOLIC PANEL
Anion gap: 9 (ref 5–15)
BUN: 46 mg/dL — ABNORMAL HIGH (ref 8–23)
CO2: 24 mmol/L (ref 22–32)
Calcium: 9.3 mg/dL (ref 8.9–10.3)
Chloride: 104 mmol/L (ref 98–111)
Creatinine, Ser: 3.54 mg/dL — ABNORMAL HIGH (ref 0.44–1.00)
GFR, Estimated: 12 mL/min — ABNORMAL LOW (ref >60)
Glucose, Bld: 91 mg/dL (ref 70–99)
Potassium: 4.8 mmol/L (ref 3.5–5.1)
Sodium: 137 mmol/L (ref 135–145)

## 2021-07-22 LAB — BRAIN NATRIURETIC PEPTIDE: B Natriuretic Peptide: 408 pg/mL — ABNORMAL HIGH (ref 0.0–100.0)

## 2021-07-22 LAB — LACTIC ACID, PLASMA: Lactic Acid, Venous: 1.4 mmol/L (ref 0.5–1.9)

## 2021-07-22 LAB — MAGNESIUM: Magnesium: 2.4 mg/dL (ref 1.7–2.4)

## 2021-07-22 LAB — APTT: aPTT: 39 seconds — ABNORMAL HIGH (ref 24–36)

## 2021-07-22 LAB — PROCALCITONIN: Procalcitonin: <0.1 ng/mL

## 2021-07-22 MED ORDER — FUROSEMIDE 10 MG/ML IJ SOLN
40.0000 mg | Freq: Two times a day (BID) | INTRAMUSCULAR | Status: DC
  Administered 2021-07-22 – 2021-07-23 (×2): 40 mg via INTRAVENOUS
  Filled 2021-07-22: qty 4

## 2021-07-22 MED ORDER — APIXABAN 2.5 MG PO TABS
2.5000 mg | ORAL_TABLET | Freq: Two times a day (BID) | ORAL | Status: DC
  Administered 2021-07-22 – 2021-07-23 (×2): 2.5 mg via ORAL
  Filled 2021-07-22 (×2): qty 1

## 2021-07-22 MED ORDER — ONDANSETRON HCL 4 MG/2ML IJ SOLN
4.0000 mg | Freq: Four times a day (QID) | INTRAMUSCULAR | Status: DC | PRN
  Administered 2021-07-23 – 2021-07-27 (×3): 4 mg via INTRAVENOUS
  Filled 2021-07-22 (×3): qty 2

## 2021-07-22 MED ORDER — ALPRAZOLAM 0.5 MG PO TABS
0.5000 mg | ORAL_TABLET | Freq: Every evening | ORAL | Status: DC | PRN
  Filled 2021-07-22: qty 1

## 2021-07-22 MED ORDER — SODIUM CHLORIDE 0.9 % IV SOLN: 250.0000 mL | INTRAVENOUS | Status: DC | PRN

## 2021-07-22 MED ORDER — SENNA 8.6 MG PO TABS
1.0000 | ORAL_TABLET | Freq: Two times a day (BID) | ORAL | Status: DC
  Administered 2021-07-22 – 2021-07-31 (×18): 8.6 mg via ORAL
  Filled 2021-07-22 (×17): qty 1

## 2021-07-22 MED ORDER — FUROSEMIDE 10 MG/ML IJ SOLN
20.0000 mg | Freq: Two times a day (BID) | INTRAMUSCULAR | Status: DC
Start: 2021-07-22 — End: 2021-07-22
  Filled 2021-07-22: qty 4

## 2021-07-22 MED ORDER — DOCUSATE SODIUM 100 MG PO CAPS
100.0000 mg | ORAL_CAPSULE | Freq: Two times a day (BID) | ORAL | Status: DC
  Administered 2021-07-22 – 2021-07-31 (×18): 100 mg via ORAL
  Filled 2021-07-22 (×19): qty 1

## 2021-07-22 MED ORDER — VITAMIN D3 25 MCG (1000 UNIT) PO TABS
2000.0000 [IU] | ORAL_TABLET | Freq: Every day | ORAL | Status: DC
  Administered 2021-07-23 – 2021-08-01 (×10): 2000 [IU] via ORAL
  Filled 2021-07-22 (×12): qty 2

## 2021-07-22 MED ORDER — SODIUM CHLORIDE 0.9% FLUSH
3.0000 mL | Freq: Two times a day (BID) | INTRAVENOUS | Status: DC
  Administered 2021-07-22 – 2021-08-01 (×17): 3 mL via INTRAVENOUS

## 2021-07-22 MED ORDER — SODIUM CHLORIDE 0.9% FLUSH: 3.0000 mL | INTRAVENOUS | Status: DC | PRN

## 2021-07-22 MED ORDER — PREGABALIN 50 MG PO CAPS
100.0000 mg | ORAL_CAPSULE | Freq: Every day | ORAL | Status: DC
  Administered 2021-07-22 – 2021-07-24 (×3): 100 mg via ORAL
  Filled 2021-07-22 (×3): qty 2

## 2021-07-22 MED ORDER — ACETAMINOPHEN 650 MG RE SUPP: 650.0000 mg | Freq: Four times a day (QID) | RECTAL | Status: DC | PRN

## 2021-07-22 MED ORDER — ASCORBIC ACID 500 MG PO TABS
1000.0000 mg | ORAL_TABLET | Freq: Every day | ORAL | Status: DC
  Administered 2021-07-23 – 2021-08-01 (×10): 1000 mg via ORAL
  Filled 2021-07-22 (×10): qty 2

## 2021-07-22 MED ORDER — SODIUM CHLORIDE 0.9 % IV SOLN
100.0000 mg | Freq: Two times a day (BID) | INTRAVENOUS | Status: AC
  Administered 2021-07-22 – 2021-07-29 (×14): 100 mg via INTRAVENOUS
  Filled 2021-07-22 (×17): qty 100

## 2021-07-22 MED ORDER — POLYVINYL ALCOHOL 1.4 % OP SOLN
1.0000 [drp] | Freq: Every day | OPHTHALMIC | Status: DC | PRN
Start: 2021-07-22 — End: 2021-08-01
  Administered 2021-07-26: 1 [drp] via OPHTHALMIC
  Filled 2021-07-22: qty 15

## 2021-07-22 MED ORDER — SODIUM CHLORIDE 0.9 % IV SOLN
2.0000 g | INTRAVENOUS | Status: AC
  Administered 2021-07-22 – 2021-07-28 (×7): 2 g via INTRAVENOUS
  Filled 2021-07-22 (×7): qty 20

## 2021-07-22 MED ORDER — ACETAMINOPHEN 325 MG PO TABS
650.0000 mg | ORAL_TABLET | Freq: Four times a day (QID) | ORAL | Status: DC | PRN
  Administered 2021-07-30 – 2021-08-01 (×3): 650 mg via ORAL
  Filled 2021-07-22 (×4): qty 2

## 2021-07-22 MED ORDER — ONDANSETRON HCL 4 MG PO TABS: 4.0000 mg | ORAL_TABLET | Freq: Four times a day (QID) | ORAL | Status: DC | PRN

## 2021-07-22 MED ORDER — DILTIAZEM HCL ER COATED BEADS 120 MG PO CP24
120.0000 mg | ORAL_CAPSULE | Freq: Every day | ORAL | Status: DC
  Administered 2021-07-23: 120 mg via ORAL
  Filled 2021-07-22: qty 1

## 2021-07-22 MED ORDER — TRAMADOL HCL 50 MG PO TABS
25.0000 mg | ORAL_TABLET | Freq: Three times a day (TID) | ORAL | Status: DC | PRN
  Administered 2021-07-22 – 2021-07-23 (×2): 25 mg via ORAL
  Filled 2021-07-22 (×2): qty 1

## 2021-07-22 MED ORDER — SODIUM CHLORIDE 0.9 % IV BOLUS
500.0000 mL | Freq: Once | INTRAVENOUS | Status: AC
  Administered 2021-07-22: 500 mL via INTRAVENOUS

## 2021-07-22 NOTE — ED Provider Notes (Signed)
Patient care taken at shift change from outgoing provider, Domenic Moras PA-C ? ?88 year old female significant history of hypertension, anemia, hyperlipidemia, atrial fibrillation currently on Eliquis brought here via EMS from home with complaints of weakness.  History obtained through EMS and through patient.  Patient endorsed having headache since last night.  Described as a flashing sensation to the top of her head that happen sporadically throughout.  Earlier this morning at breakfast she reported feeling very weak and tremulous.  She endorsed blurry vision but this is not new.  She does endorse some mild nausea without vomiting.  She does not complain of any runny nose sneezing or coughing no chest pain or trouble breathing no abdominal pain no dysuria.  She denies any focal numbness or focal weakness.  She normally walks with a cane and states she does not ambulate much.  ?Physical Exam  ?BP 102/61 (BP Location: Right Arm)   Pulse 63   Temp 98.3 ?F (36.8 ?C) (Oral)   Resp 16   Ht '5\' 5"'$  (1.651 m)   Wt 73 kg   SpO2 93%   BMI 26.78 kg/m?  ? ?Physical Exam ? ?Procedures  ?Procedures ? ?ED Course / MDM  ?  ?Medical Decision Making ?Amount and/or Complexity of Data Reviewed ?Labs: ordered. ?Radiology: ordered. ? ? ?CT head shows no acute intracranial abnormality.  Mild sequela of chronic small vessel ischemic disease ? ?COVID, influenza negative.   ? ?There is left lower lobe consolidation. Concerns for possible pneumonia.  ? ?Given new oxygen requirement, general weakness, elevated BNP, I do not feel that the patient is safe for discharge at this time.  ? ?  ? ?I discussed the case with the hospitalist, Dr. Tyrell Antonio,  who agreed to admit the patient.  ?  ?Dorothyann Peng, PA-C ?07/22/21 1718 ? ?  ?Varney Biles, MD ?08/01/21 1425 ? ?

## 2021-07-22 NOTE — Assessment & Plan Note (Addendum)
On diltiazem dose reduce.  ?GI, recommend to resume eliquis and monitor.  ?Hb has remain stable.  ?

## 2021-07-22 NOTE — Assessment & Plan Note (Addendum)
-   Continue with PPI 

## 2021-07-22 NOTE — Assessment & Plan Note (Addendum)
AKI on CKD stage IV, concern for cardiorenal syndrome.  ?Cr per records 2.5---2.1 ?Cr on admission 3.5--3.0--3.1-3.2 ?Nephrology consulted to assist with diuresis.  ?Renal US no hydronephrosis.  ?Continue to have good urine out put.  ?Holding diuretics, monitor Cr.  ?Appreciate nephrology assistance.  ?

## 2021-07-22 NOTE — H&P (Signed)
?History and Physical  ? ? ?Patient: Krystal Henderson:[id number] DOB: 1933-02-04 ?DOA: 07/22/2021 ?DOS: the patient was seen and examined on 07/22/2021 ?PCP: Ann Held, DO  ?Patient coming from: Home ? ?Chief Complaint:  ?Chief Complaint  ?Patient presents with  ? Weakness  ? ?HPI: Krystal Henderson is a 88 y.o. female with medical history significant of  Diastolic, Systolic HF Ef 94-76 %, hypertension, chronic low back pain, compression fracture, GERD, B12 deficiency, who presents with generalized weakness, inability to stand up from her recliner.  She had breakfast this morning and she has not been able to sleep well lately.  She got so weak that she could not stand from her recliner today. Also had some tremors.   She has been having a dry cough for the last 2 or 3 days.  She report mild nausea.  Denies fever.  She has chronic back pain related to fracture, for which she cannot have surgery.  She takes Tylenol but does not really help.  She denies abdominal pain, last bowel movement yesterday.  She does have problem with constipation.  She has a history of basal cell cancer of lower extremity status post removal 2 weeks ago.  She follows with her dermatologist yesterday and was recommended oral doxycycline and dressing changes twice daily.  ? ?She denies chest pain.  She was found to be hypoxic in the ED and was place on 2 L oxygen. She report LE edema, worse few days. Report SOB.  ? ?She presented also complaining of headaches, when asked, she is really having pain in her scalp. Her dermatologist gave her some medication, cream for her scalp.  ?She also report a lump in her throat since she was started on her heart meds. Worse at night, after she takes Tums it goes away.  ? ? ?Review of Systems: As mentioned in the history of present illness. All other systems reviewed and are negative. ?Past Medical History:  ?Diagnosis Date  ? Anemia   ? hx of   ? Angiodysplasia 2007  ? @ colonoscopy  ? Anxiety    ? PMH of  ? Chronic low back pain 06/21/2014  ? Diverticulosis of colon (without mention of hemorrhage)   ? DJD (degenerative joint disease)   ? Esophageal reflux   ? inactive  ? History of vertebral fracture 11/2015  ? HTN (hypertension)   ? Hyperlipemia 2006  ? LDL 130  ? Hypoglycemia   ? reactive  ? Pelvic fracture ([place]) 12/30/2011  ? [facility]  ? Peripheral neuropathy   ? Personal history of colonic polyps   ? adenomatous  ? Vitamin B12 deficiency   ? ?Past Surgical History:  ?Procedure Laterality Date  ? cataract surgery  12-26-12 and 01-09-13  ? COLONOSCOPY  2011  ? neg  ? COLONOSCOPY W/ POLYPECTOMY    ? X 2 , Dr  Verl Blalock; angiodysplasia. Due 2022  ? DILATION AND CURETTAGE OF UTERUS    ? FACIAL COSMETIC SURGERY    ? HEMORROIDECTOMY    ? LUMBAR LAMINECTOMY/DECOMPRESSION MICRODISCECTOMY  09/10/2011  ? Procedure: LUMBAR LAMINECTOMY/DECOMPRESSION MICRODISCECTOMY;  Surgeon: Johnn Hai, MD;  Location: [facility];  Service: Orthopedics;  Laterality: N/A;  decompression laminectomy L2-3, L3-4, L4-5  ? ORIF WRIST FRACTURE  01/11/2012  ? Procedure: OPEN REDUCTION INTERNAL FIXATION (ORIF) WRIST FRACTURE;  Surgeon: Tennis Must, MD;  Location: [facility];  Service: Orthopedics;  Laterality: Right;  ? TEAR DUCT PROBING    ?  X 2  ? TOTAL HIP ARTHROPLASTY  2000  ? right  ? TUBAL LIGATION    ? ?Social History:  reports that she has never smoked. She has never used smokeless tobacco. She reports current alcohol use of about 3.0 standard drinks per week. She reports that she does not use drugs. ? ?Allergies  ?Allergen Reactions  ? Codeine Nausea Only  ? Duloxetine Other (See Comments)  ?  REACTION: intolerance--She does not remember taking this Rx- states she did not feel well   ? Sertraline Hcl Other (See Comments)  ?  REACTION: intolerance-- Did not help with Depression  ? ? ?Family History  ?Problem Relation Age of Onset  ? Heart attack Father 58  ? Kidney disease Mother   ?     renal failure  ?  Throat cancer Sister   ?     smoker  ? Osteoporosis Sister   ? Heart attack Brother 14  ? Stroke Brother 84  ?     smoker  ? Depression Maternal Uncle   ? Cancer Brother   ?     X3  lung cancer, all smokers  ? Peripheral vascular disease Daughter   ? Colon cancer Neg Hx   ? Diabetes Neg Hx   ? ? ?Prior to Admission medications   ?Medication Sig Start Date End Date Taking? Authorizing Provider  ?ALPRAZolam (XANAX) 0.25 MG tablet Take 2-4 tablets (0.5-1 mg total) by mouth at bedtime as needed. Anxiety 08/07/20   [name] R, DO  ?amLODipine (NORVASC) 5 MG tablet TAKE 1 TABLET(5 MG) BY MOUTH DAILY 04/14/21   Ann Held, DO  ?Ascorbic Acid (VITAMIN C) 1000 MG tablet Take 1,000 mg by mouth daily.    [provider]  ?Calcium Carbonate (CALCIUM 600 PO) Take 1 capsule by mouth daily.    [provider]  ?Cholecalciferol (VITAMIN D) 2000 UNITS CAPS Take 1 capsule by mouth daily.    [provider]  ?diltiazem (CARDIZEM CD) 120 MG 24 hr capsule TAKE 1 CAPSULE(120 MG) BY MOUTH DAILY 05/14/21   Camnitz, Ocie Doyne, MD  ?ELIQUIS 2.5 MG TABS tablet TAKE 1 TABLET(2.5 MG) BY MOUTH TWICE DAILY 05/14/21   Camnitz, Ocie Doyne, MD  ?furosemide (LASIX) 40 MG tablet Take 1 tablet (40 mg total) by mouth daily. 02/12/21   [name] R, DO  ?losartan (COZAAR) 100 MG tablet TAKE 1 TABLET BY MOUTH EVERY DAY 04/14/21   Carollee Herter, Alferd Apa, DO  ?LYRICA 100 MG capsule Take 1 capsule (100 mg total) by mouth at bedtime. 04/09/21   Suzzanne Cloud, NP  ?Polyethyl Glycol-Propyl Glycol (SYSTANE ULTRA OP) Apply 1 drop to eye daily as needed (for dry eyes).    [provider]  ? ? ?Physical Exam: ?Vitals:  ? 07/22/21 1344 07/22/21 1354 07/22/21 1415 07/22/21 1600  ?BP: 102/61  (!) 99/59 (!) 100/47  ?Pulse: 63  78 60  ?Resp: _0 ?Temp: 98.3 ?F (36.8 ?C)     ?TempSrc: Oral     ?SpO2: 93%  94% 93%  ?Weight:  73 kg    ?Height:  5' 5" (1.651 m)    ? ?General; NAD, generalized weak. Alert,  answer questions.  ?CVS; S 1, S 2 RRR positive JVD ?Lung; Decreased breath sound, BL crackles.  ?Abdomen; soft, nt, nd, No rigidity.  ?Extremity; BL LE edema, Right LE with redness, small open wound, after excision of basal skin cancer.  ?Neuro; alert,  oriented times 3. Follows command. Normal Upper extremities streghn, she was not able to move BL LE due to heaving edema.  ? ?Data Reviewed: ? ?Cbc reviewed, B-met ordered and pending.  ? ?Assessment and Plan: ?* Acute exacerbation of CHF (congestive heart failure) ([place]) ?Patient presents with cough, SOB, elevation BNP, weakness, LE edema. Chest x ray with HF finding.  ?Admit to telemetry.  ?Start IV lasix.  ?Check ECHO.  ?Monitor I and O. Daily weight.  ? ? ? ?Headache ?CT head negative for acute finding.  ?Her pain is more scalp pain, irritation.  ?Needs to follow up with dermatologist  ? ?CKD (chronic kidney disease) stage 4, GFR 15-29 ml/min ([place]) ?Cr per records 2.5---2.1 ?Awaiting renal function.  ? ?Generalized weakness ?Suspect deconditioning, als related to HF, LE edema.  ?PT /OT consult.  ?If no improvement of LE weakness could consider MRI>  ? ?Paroxysmal atrial fibrillation (HCC) ?On diltiazem.  ?Continue with eliquis.  ? ?CAP (community acquired pneumonia) ?She report cough. Chest x ray Left Lower lobe atelectasis vs consolidations.  ?Check pro-calcitinin.  ?Start ceftriaxone and Doxy.  ? ?Peripheral neuropathy ?Continue with lyrica.  ? ?Vitamin B12 deficiency ?On supplement.  ? ?DJD (degenerative joint disease) ?Start tramadol for chronic back pain and headaches.  ? ?GERD (gastroesophageal reflux disease) ?Start PPI.  ? ?HTN (hypertension) ?BP soft, hold amlodipine and Cozaar.  ? ? ? ? ? Advance Care Planning:   Code Status: Prior DNR, patient wishes to be DNR ? ?Consults: None ? ?Family Communication: Husband at bedside.  ? ?Severity of Illness: ?The appropriate patient status for this patient is OBSERVATION. Observation status is judged to be  reasonable and necessary in order to provide the required intensity of service to ensure the patient's safety. The patient's presenting symptoms, physical exam findings, and initial radiographic and laboratory data i

## 2021-07-22 NOTE — Assessment & Plan Note (Signed)
On supplement 

## 2021-07-22 NOTE — ED Provider Notes (Signed)
?[facility] DEPT ?Provider Note ? ? ?CSN: [id number] ?Arrival date & time: 07/22/21  1332 ? ?  ? ?History ? ?No chief complaint on file. ? ? ?Krystal Henderson is a 88 y.o. female. ? ?The history is provided by the patient, the EMS personnel and medical records. No language interpreter was used.  ? ?88 year old female significant history of hypertension, anemia, hyperlipidemia, atrial fibrillation currently on Eliquis brought here via EMS from home with complaints of weakness.  History obtained through EMS and through patient.  Patient endorsed having headache since last night.  Described as a flashing sensation to the top of her head that happen sporadically throughout.  Earlier this morning at breakfast she reported feeling very weak and tremulous.  She endorsed blurry vision but this is not new.  She does endorse some mild nausea without vomiting.  She does not complain of any runny nose sneezing or coughing no chest pain or trouble breathing no abdominal pain no dysuria.  She denies any focal numbness or focal weakness.  She normally walks with a cane and states she does not ambulate much.  ? ?Home Medications ?Prior to Admission medications   ?Medication Sig Start Date End Date Taking? Authorizing Provider  ?ALPRAZolam (XANAX) 0.25 MG tablet Take 2-4 tablets (0.5-1 mg total) by mouth at bedtime as needed. Anxiety 08/07/20   [name] R, DO  ?amLODipine (NORVASC) 5 MG tablet TAKE 1 TABLET(5 MG) BY MOUTH DAILY 04/14/21   Ann Held, DO  ?Ascorbic Acid (VITAMIN C) 1000 MG tablet Take 1,000 mg by mouth daily.    [provider]  ?Calcium Carbonate (CALCIUM 600 PO) Take 1 capsule by mouth daily.    [provider]  ?Cholecalciferol (VITAMIN D) 2000 UNITS CAPS Take 1 capsule by mouth daily.    [provider]  ?diltiazem (CARDIZEM CD) 120 MG 24 hr capsule TAKE 1 CAPSULE(120 MG) BY MOUTH DAILY 05/14/21   Camnitz, Ocie Doyne, MD  ?ELIQUIS 2.5 MG  TABS tablet TAKE 1 TABLET(2.5 MG) BY MOUTH TWICE DAILY 05/14/21   Camnitz, Ocie Doyne, MD  ?furosemide (LASIX) 40 MG tablet Take 1 tablet (40 mg total) by mouth daily. 02/12/21   [name] R, DO  ?losartan (COZAAR) 100 MG tablet TAKE 1 TABLET BY MOUTH EVERY DAY 04/14/21   Carollee Herter, Alferd Apa, DO  ?LYRICA 100 MG capsule Take 1 capsule (100 mg total) by mouth at bedtime. 04/09/21   Suzzanne Cloud, NP  ?Polyethyl Glycol-Propyl Glycol (SYSTANE ULTRA OP) Apply 1 drop to eye daily as needed (for dry eyes).    [provider]  ?   ? ?Allergies    ?Codeine, Duloxetine, and Sertraline hcl   ? ?Review of Systems   ?Review of Systems  ?All other systems reviewed and are negative. ? ?Physical Exam ?Updated Vital Signs ?There were no vitals taken for this visit. ?Physical Exam ?Vitals and nursing note reviewed.  ?Constitutional:   ?   General: She is not in acute distress. ?   Appearance: She is well-developed.  ?   Comments: Elderly female, appears tremulous but not in acute distress.  ?HENT:  ?   Head: Normocephalic and atraumatic.  ?   Comments: No overlying skin changes to her scalp. ?Eyes:  ?   Extraocular Movements: Extraocular movements intact.  ?   Conjunctiva/sclera: Conjunctivae normal.  ?   Pupils: Pupils are equal, round, and reactive to light.  ?Neck:  ?   Comments:  No nuchal rigidity ?Cardiovascular:  ?   Rate and Rhythm: Rhythm irregular.  ?Pulmonary:  ?   Effort: Pulmonary effort is normal.  ?   Breath sounds: No wheezing or rhonchi.  ?Abdominal:  ?   Palpations: Abdomen is soft.  ?   Tenderness: There is no abdominal tenderness.  ?Musculoskeletal:  ?   Cervical back: Normal range of motion and neck supple.  ?   Right lower leg: Edema present.  ?   Left lower leg: Edema present.  ?   Comments: Global weakness but equal strength throughout.  Left arm is more tremulous compared to other extremities.  ?Skin: ?   Findings: Lesion (Right lower extremity: There is a shallow ulceration approximate 2 cm  noted to the anterior mid tib-fib with surrounding skin erythema edema and warmth involving the anterior aspect of her right lower extremity.) present. No rash.  ?Neurological:  ?   Mental Status: She is alert and oriented to person, place, and time.  ?Psychiatric:     ?   Mood and Affect: Mood normal.  ? ? ?ED Results / Procedures / Treatments   ?Labs ?(all labs ordered are listed, but only abnormal results are displayed) ?Labs Reviewed - No data to display ? ?EKG ?EKG Interpretation ? ?Date/Time:  Wednesday July 22 2021 13:57:55 EDT ?Ventricular Rate:  54 ?PR Interval:    ?QRS Duration: 86 ?QT Interval:  413 ?QTC Calculation: 392 ?R Axis:   104 ?Text Interpretation: Atrial fibrillation Right axis deviation Low voltage, extremity leads Confirmed by Madalyn Rob [phone]) on 07/22/2021 2:21:53 PM ? ?Radiology ?No results found. ? ?Procedures ?Procedures  ? ? ?Medications Ordered in ED ?Medications - No data to display ? ?ED Course/ Medical Decision Making/ A&P ?  ?                        ?Medical Decision Making ? ?BP 102/61 (BP Location: Right Arm)   Pulse 63   Temp 98.3 ?F (36.8 ?C) (Oral)   Resp 16   Ht '5\' 5"'$  (1.651 m)   Wt 73 kg   SpO2 93%   BMI 26.78 kg/m?  ? ?2:02 PM ?This is an elderly 88 year old female presenting with a chief complaints of generalized weakness.  Patient reported developing headache to the top of her head described more as flashing sensation that has been intermittently ongoing since last night.  She also endorsed feeling very weak.  She exhibits a tremors to her left arm on initial exam.  She is globally weak.  She has skin cancer involving her right lower extremity that was recently excised and she was last seen recently for this.  On exam she does have erythematous skin changes possibly indicating cellulitis.  Initial blood pressure is 366 systolic.  With her low blood pressure and generalized weakness, I have considered sepsis as a potential cause.  EKG shows atrial  fibrillation.  We will screen for potential infection. ? ?Patient also is on blood thinner medication and does complaining of headache.  I have considered subarachnoid hemorrhage and will obtain head CT scan for further assessment.  Care discussed with Dr. Roslynn Amble ? ?2:53 PM ?Labs, EKG, and imaging independently viewed interpreted by me.  EKG shows atrial fibrillation.  Chest x-ray does show evidence of congestive heart failure with left lower lobe consolidation that is difficult to exclude pneumonia.  In the room now her O2 sat is 87% on room air, will give supplemental oxygen.  A  COVID and flu test have been ordered and have not resulted yet.  If negative, will consider initiate antibiotic and have patient admitted for pneumonia.  She has normal lactic acid and normal WBC.  Her hemoglobin is 8.9. ? ?3:11 PM ?Pt sign out to oncoming team who will call for admission once labs have resulted ? ?This patient presents to the ED for concern of weakness, this involves an extensive number of treatment options, and is a complaint that carries with it a high risk of complications and morbidity.  The differential diagnosis includes sepsis, metabolic derangement, anemia, hypo/hyperglycemia, stroke, acs ? ?Co morbidities that complicate the patient evaluation ?afib on anticoagulant ? Kidney disease ? chf ?Additional history obtained: ? ?Additional history obtained from EMS ?External records from outside source obtained and reviewed including prior ER visits and prior hospitalizations. Pt does have hx of vitD and VitB12 deficiency ? ?Lab Tests: ? ?I Ordered, and personally interpreted labs.  The pertinent results include:  reassuring labs ? ?Imaging Studies ordered: ? ?I ordered imaging studies including CXR ?I independently visualized and interpreted imaging which showed possible LLL consolidation ?I agree with the radiologist interpretation ? ?Cardiac Monitoring: ? ?The patient was maintained on a cardiac monitor.  I  personally viewed and interpreted the cardiac monitored which showed an underlying rhythm of: afib ? ?Medicines ordered and prescription drug management: ? ?I ordered medication including IVF  for dehydration ?Reevaluation of the

## 2021-07-22 NOTE — Assessment & Plan Note (Signed)
BP soft, hold amlodipine and Cozaar.  ?

## 2021-07-22 NOTE — Assessment & Plan Note (Addendum)
Vicodin  for chronic back pain and headaches.  ?

## 2021-07-22 NOTE — ED Triage Notes (Signed)
Pt bib ems from home c/o increased weakness since this morning, headache since last night. ?

## 2021-07-22 NOTE — Assessment & Plan Note (Signed)
Continue with lyrica.  ?

## 2021-07-22 NOTE — Assessment & Plan Note (Addendum)
She report cough. Chest x ray Left Lower lobe atelectasis vs consolidations.  ?Pro-calcitonin not significantly elevated but will Continue  ceftriaxone and Doxy to cover for PNA and LE cellulitis. Plan for 7 days antibiotics.  ?Day 7 antibiotics.  ?

## 2021-07-22 NOTE — Assessment & Plan Note (Signed)
CT head negative for acute finding.  ?Her pain is more scalp pain, irritation.  ?Needs to follow up with dermatologist  ?

## 2021-07-22 NOTE — Assessment & Plan Note (Addendum)
Suspect deconditioning, als related to HF, LE edema.  ?PT /OT consult.  ?BL weakness improving.  ?

## 2021-07-22 NOTE — Assessment & Plan Note (Addendum)
Patient presents with cough, SOB, elevation BNP, weakness, LE edema. Chest x ray with HF finding.  ?ECHO; Ef 55 % ?Weight on admission 160 pounds--184--180-178 ?Cardiology consulted and sign off.  ?Good urine out put. ?Received 3 days of  Metolazone.  ?Holding lasix today due to increase in Cr. Per nephrology she will need higher dose of lasix at discharge.  ?

## 2021-07-23 ENCOUNTER — Inpatient Hospital Stay (HOSPITAL_COMMUNITY): Payer: Medicare Other

## 2021-07-23 ENCOUNTER — Encounter (HOSPITAL_COMMUNITY): Payer: Self-pay | Admitting: Internal Medicine

## 2021-07-23 ENCOUNTER — Observation Stay (HOSPITAL_COMMUNITY): Payer: Medicare Other

## 2021-07-23 DIAGNOSIS — J189 Pneumonia, unspecified organism: Secondary | ICD-10-CM | POA: Diagnosis present

## 2021-07-23 DIAGNOSIS — G8929 Other chronic pain: Secondary | ICD-10-CM | POA: Diagnosis present

## 2021-07-23 DIAGNOSIS — I5043 Acute on chronic combined systolic (congestive) and diastolic (congestive) heart failure: Secondary | ICD-10-CM | POA: Diagnosis present

## 2021-07-23 DIAGNOSIS — K219 Gastro-esophageal reflux disease without esophagitis: Secondary | ICD-10-CM | POA: Diagnosis present

## 2021-07-23 DIAGNOSIS — S81801A Unspecified open wound, right lower leg, initial encounter: Secondary | ICD-10-CM | POA: Diagnosis not present

## 2021-07-23 DIAGNOSIS — E876 Hypokalemia: Secondary | ICD-10-CM | POA: Diagnosis not present

## 2021-07-23 DIAGNOSIS — E8779 Other fluid overload: Secondary | ICD-10-CM | POA: Diagnosis not present

## 2021-07-23 DIAGNOSIS — Z808 Family history of malignant neoplasm of other organs or systems: Secondary | ICD-10-CM | POA: Diagnosis not present

## 2021-07-23 DIAGNOSIS — Z66 Do not resuscitate: Secondary | ICD-10-CM | POA: Diagnosis present

## 2021-07-23 DIAGNOSIS — I509 Heart failure, unspecified: Secondary | ICD-10-CM | POA: Diagnosis not present

## 2021-07-23 DIAGNOSIS — D649 Anemia, unspecified: Secondary | ICD-10-CM | POA: Diagnosis not present

## 2021-07-23 DIAGNOSIS — J811 Chronic pulmonary edema: Secondary | ICD-10-CM | POA: Diagnosis not present

## 2021-07-23 DIAGNOSIS — N184 Chronic kidney disease, stage 4 (severe): Secondary | ICD-10-CM | POA: Diagnosis present

## 2021-07-23 DIAGNOSIS — Z85828 Personal history of other malignant neoplasm of skin: Secondary | ICD-10-CM | POA: Diagnosis not present

## 2021-07-23 DIAGNOSIS — I480 Paroxysmal atrial fibrillation: Secondary | ICD-10-CM | POA: Diagnosis present

## 2021-07-23 DIAGNOSIS — Z823 Family history of stroke: Secondary | ICD-10-CM | POA: Diagnosis not present

## 2021-07-23 DIAGNOSIS — E1122 Type 2 diabetes mellitus with diabetic chronic kidney disease: Secondary | ICD-10-CM | POA: Diagnosis present

## 2021-07-23 DIAGNOSIS — I130 Hypertensive heart and chronic kidney disease with heart failure and stage 1 through stage 4 chronic kidney disease, or unspecified chronic kidney disease: Secondary | ICD-10-CM | POA: Diagnosis present

## 2021-07-23 DIAGNOSIS — N179 Acute kidney failure, unspecified: Secondary | ICD-10-CM | POA: Diagnosis present

## 2021-07-23 DIAGNOSIS — E785 Hyperlipidemia, unspecified: Secondary | ICD-10-CM | POA: Diagnosis present

## 2021-07-23 DIAGNOSIS — R0609 Other forms of dyspnea: Secondary | ICD-10-CM | POA: Diagnosis not present

## 2021-07-23 DIAGNOSIS — I5023 Acute on chronic systolic (congestive) heart failure: Secondary | ICD-10-CM | POA: Diagnosis not present

## 2021-07-23 DIAGNOSIS — Z801 Family history of malignant neoplasm of trachea, bronchus and lung: Secondary | ICD-10-CM | POA: Diagnosis not present

## 2021-07-23 DIAGNOSIS — Z20822 Contact with and (suspected) exposure to covid-19: Secondary | ICD-10-CM | POA: Diagnosis present

## 2021-07-23 DIAGNOSIS — D638 Anemia in other chronic diseases classified elsewhere: Secondary | ICD-10-CM | POA: Diagnosis present

## 2021-07-23 DIAGNOSIS — Z7901 Long term (current) use of anticoagulants: Secondary | ICD-10-CM | POA: Diagnosis not present

## 2021-07-23 DIAGNOSIS — Z79899 Other long term (current) drug therapy: Secondary | ICD-10-CM | POA: Diagnosis not present

## 2021-07-23 DIAGNOSIS — E1142 Type 2 diabetes mellitus with diabetic polyneuropathy: Secondary | ICD-10-CM | POA: Diagnosis present

## 2021-07-23 DIAGNOSIS — R627 Adult failure to thrive: Secondary | ICD-10-CM | POA: Diagnosis not present

## 2021-07-23 DIAGNOSIS — R531 Weakness: Secondary | ICD-10-CM | POA: Diagnosis present

## 2021-07-23 DIAGNOSIS — Z8249 Family history of ischemic heart disease and other diseases of the circulatory system: Secondary | ICD-10-CM | POA: Diagnosis not present

## 2021-07-23 DIAGNOSIS — E538 Deficiency of other specified B group vitamins: Secondary | ICD-10-CM | POA: Diagnosis present

## 2021-07-23 DIAGNOSIS — J90 Pleural effusion, not elsewhere classified: Secondary | ICD-10-CM | POA: Diagnosis not present

## 2021-07-23 DIAGNOSIS — D500 Iron deficiency anemia secondary to blood loss (chronic): Secondary | ICD-10-CM | POA: Diagnosis present

## 2021-07-23 LAB — IRON AND TIBC
Iron: 41 ug/dL (ref 28–170)
Saturation Ratios: 10 % — ABNORMAL LOW (ref 10.4–31.8)
TIBC: 402 ug/dL (ref 250–450)
UIBC: 361 ug/dL

## 2021-07-23 LAB — URINALYSIS, ROUTINE W REFLEX MICROSCOPIC
Bilirubin Urine: NEGATIVE
Glucose, UA: NEGATIVE mg/dL
Hgb urine dipstick: NEGATIVE
Ketones, ur: NEGATIVE mg/dL
Leukocytes,Ua: NEGATIVE
Nitrite: NEGATIVE
Protein, ur: NEGATIVE mg/dL
Specific Gravity, Urine: 1.011 (ref 1.005–1.030)
pH: 5 (ref 5.0–8.0)

## 2021-07-23 LAB — CBC
HCT: 24.8 % — ABNORMAL LOW (ref 36.0–46.0)
Hemoglobin: 8 g/dL — ABNORMAL LOW (ref 12.0–15.0)
MCH: 28.7 pg (ref 26.0–34.0)
MCHC: 32.3 g/dL (ref 30.0–36.0)
MCV: 88.9 fL (ref 80.0–100.0)
Platelets: 304 10*3/uL (ref 150–400)
RBC: 2.79 MIL/uL — ABNORMAL LOW (ref 3.87–5.11)
RDW: 17.7 % — ABNORMAL HIGH (ref 11.5–15.5)
WBC: 5.1 10*3/uL (ref 4.0–10.5)
nRBC: 0 % (ref 0.0–0.2)

## 2021-07-23 LAB — ECHOCARDIOGRAM COMPLETE
AR max vel: 2.54 cm2
AV Area VTI: 2.21 cm2
AV Area mean vel: 2.46 cm2
AV Mean grad: 3 mmHg
AV Peak grad: 6 mmHg
Ao pk vel: 1.22 m/s
Area-P 1/2: 3.91 cm2
Height: 65 in
MV M vel: 4.63 m/s
MV Peak grad: 85.7 mmHg
P 1/2 time: 635 msec
S' Lateral: 3.5 cm
Weight: 2574.97 oz

## 2021-07-23 LAB — COMPREHENSIVE METABOLIC PANEL
ALT: 15 U/L (ref 0–44)
AST: 20 U/L (ref 15–41)
Albumin: 2.9 g/dL — ABNORMAL LOW (ref 3.5–5.0)
Alkaline Phosphatase: 47 U/L (ref 38–126)
Anion gap: 7 (ref 5–15)
BUN: 42 mg/dL — ABNORMAL HIGH (ref 8–23)
CO2: 23 mmol/L (ref 22–32)
Calcium: 8.5 mg/dL — ABNORMAL LOW (ref 8.9–10.3)
Chloride: 106 mmol/L (ref 98–111)
Creatinine, Ser: 3.25 mg/dL — ABNORMAL HIGH (ref 0.44–1.00)
GFR, Estimated: 13 mL/min — ABNORMAL LOW (ref >60)
Glucose, Bld: 79 mg/dL (ref 70–99)
Potassium: 5.1 mmol/L (ref 3.5–5.1)
Sodium: 136 mmol/L (ref 135–145)
Total Bilirubin: 1.1 mg/dL (ref 0.3–1.2)
Total Protein: 5.9 g/dL — ABNORMAL LOW (ref 6.5–8.1)

## 2021-07-23 LAB — SODIUM, URINE, RANDOM: Sodium, Ur: 108 mmol/L

## 2021-07-23 LAB — FERRITIN: Ferritin: 23 ng/mL (ref 11–307)

## 2021-07-23 LAB — TSH: TSH: 3.311 u[IU]/mL (ref 0.350–4.500)

## 2021-07-23 LAB — CREATININE, URINE, RANDOM: Creatinine, Urine: 67.55 mg/dL

## 2021-07-23 MED ORDER — PANTOPRAZOLE SODIUM 40 MG PO TBEC
40.0000 mg | DELAYED_RELEASE_TABLET | Freq: Two times a day (BID) | ORAL | Status: DC
  Administered 2021-07-23 – 2021-08-01 (×19): 40 mg via ORAL
  Filled 2021-07-23 (×19): qty 1

## 2021-07-23 MED ORDER — DILTIAZEM HCL 30 MG PO TABS
30.0000 mg | ORAL_TABLET | Freq: Two times a day (BID) | ORAL | Status: DC
  Administered 2021-07-24 – 2021-07-29 (×11): 30 mg via ORAL
  Filled 2021-07-23 (×11): qty 1

## 2021-07-23 MED ORDER — HYDROCODONE-ACETAMINOPHEN 5-325 MG PO TABS
1.0000 | ORAL_TABLET | Freq: Four times a day (QID) | ORAL | Status: DC | PRN
  Administered 2021-07-23 – 2021-08-01 (×8): 1 via ORAL
  Filled 2021-07-23 (×8): qty 1

## 2021-07-23 MED ORDER — CHLORHEXIDINE GLUCONATE CLOTH 2 % EX PADS
6.0000 | MEDICATED_PAD | Freq: Every day | CUTANEOUS | Status: DC
  Administered 2021-07-24 – 2021-07-31 (×9): 6 via TOPICAL

## 2021-07-23 MED ORDER — ONDANSETRON HCL 4 MG/2ML IJ SOLN
4.0000 mg | Freq: Once | INTRAMUSCULAR | Status: AC
  Administered 2021-07-23: 4 mg via INTRAVENOUS
  Filled 2021-07-23: qty 2

## 2021-07-23 MED ORDER — FUROSEMIDE 10 MG/ML IJ SOLN
80.0000 mg | Freq: Two times a day (BID) | INTRAMUSCULAR | Status: DC
  Administered 2021-07-23 – 2021-07-24 (×2): 80 mg via INTRAVENOUS
  Filled 2021-07-23 (×2): qty 8

## 2021-07-23 NOTE — Assessment & Plan Note (Addendum)
Hb trending down, hb one moth ago at 10.  ?Hb on admission 8.9---8.0.--9 ?Iron: 49 T sat 10. Ferritin 21/ will discussed with nephrology in regards aranesp/Iron. ?GI consulted, and recommend to monitor patient on eliquis.  ?Hb stable ?

## 2021-07-23 NOTE — Assessment & Plan Note (Addendum)
She has Right LE wound after excision of basal cell cancer 2 weeks ago.  ?She has some redness. Her dermatologist prescribe Doxycycline for 7 days to start on 4/19. ?Continue with antibiotics.  ?Blood culture no growth to date.  ?

## 2021-07-23 NOTE — Progress Notes (Signed)
?   07/23/21 1941  ?Vitals  ?Temp (!) 93 ?F (33.9 ?C)  ?Temp Source Rectal  ?BP (!) 92/48  ?MAP (mmHg) (!) 58  ?BP Location Left Arm  ?BP Method Automatic  ?Patient Position (if appropriate) Lying  ?Pulse Rate 63  ?Pulse Rate Source Monitor  ?Resp 17  ?Level of Consciousness  ?Level of Consciousness Alert  ?MEWS COLOR  ?MEWS Score Color Yellow  ?Oxygen Therapy  ?SpO2 94 %  ?O2 Device Nasal Cannula  ?O2 Flow Rate (L/min) 2 L/min  ?Pain Assessment  ?Pain Scale 0-10  ?Pain Score 0  ?ECG Monitoring  ?Cardiac Rhythm Atrial fibrillation  ?Glasgow Coma Scale  ?Eye Opening 4  ?Best Verbal Response (NON-intubated) 5  ?Best Motor Response 6  ?Glasgow Coma Scale Score 15  ?MEWS Score  ?MEWS Temp 2  ?MEWS Systolic 1  ?MEWS Pulse 0  ?MEWS RR 0  ?MEWS LOC 0  ?MEWS Score 3  ?Provider Notification  ?Provider Name/Title Thalia Party, MD  ?Date Provider Notified 07/23/21  ?Time Provider Notified 1955  ?Method of Notification Page  ?Notification Reason Change in status  ?Provider response See new orders  ?Date of Provider Response 07/23/21  ?Time of Provider Response 1956  ? ? ?

## 2021-07-23 NOTE — Evaluation (Signed)
Occupational Therapy Evaluation Patient Details Name: Krystal Henderson MRN: [id number] DOB: Aug 13, 1932 Today's Date: 07/23/2021   History of Present Illness 88 y.o. female  who presents with generalized weakness, inability to stand up from her recliner. Dx of CHF, CAP. PMH: Diastolic, Systolic HF Ef 45-50 %, hypertension, chronic low back pain, compression fracture, GERD, B12 deficiency,   Clinical Impression   Patient lives at home with spouse and is typically ambulatory limited distances with cane. Patient states her husband assists her in/out of the tub shower otherwise she performs her own ADLs. Currently patient needing mod +2 for bed mobility and stand pivot transfer to/from chair using rolling walker. Patient orthostatic during evaluation, becoming less responsive while seated in chair but did not pass out completely and was able to assist with standing to get back to bed. Patient needing total A for clothing management to doff soiled underwear and don clean brief. Patient reports she spouse can provided limited physical assistance "I don't think he could do all of that." Due to global weakness, impaired balance and current medical status/orthostatic hypotension would recommend short term rehab at D/C unless patient progresses to +1 assist at time of D/C from hospital. Acute OT to follow.       Recommendations for follow up therapy are one component of a multi-disciplinary discharge planning process, led by the attending physician.  Recommendations may be updated based on patient status, additional functional criteria and insurance authorization.   Follow Up Recommendations  Skilled nursing-short term rehab (<3 hours/day) (unless able to progress acutely)    Assistance Recommended at Discharge Frequent or constant Supervision/Assistance  Patient can return home with the following Two people to help with walking and/or transfers;A lot of help with bathing/dressing/bathroom;Assistance with  cooking/housework;Assist for transportation;Help with stairs or ramp for entrance    Functional Status Assessment  Patient has had a recent decline in their functional status and demonstrates the ability to make significant improvements in function in a reasonable and predictable amount of time.  Equipment Recommendations  None recommended by OT       Precautions / Restrictions Precautions Precautions: Fall Precaution Comments: Monitor BP-orthostatic Restrictions Weight Bearing Restrictions: No      Mobility Bed Mobility Overal bed mobility: Needs Assistance Bed Mobility: Rolling, Sidelying to Sit, Sit to Sidelying Rolling: Max assist Sidelying to sit: Mod assist, +2 for physical assistance, +2 for safety/equipment, HOB elevated     Sit to sidelying: Mod assist, +2 for physical assistance, +2 for safety/equipment General bed mobility comments: Attempt log roll due to hx of back pain/compression fx's however difficulty in gurney. Patient needing +2 assistance due to stiffness in extremities and weakness        Balance Overall balance assessment: Needs assistance Sitting-balance support: Feet supported, Single extremity supported, Bilateral upper extremity supported Sitting balance-Leahy Scale: Poor     Standing balance support: During functional activity, Reliant on assistive device for balance Standing balance-Leahy Scale: Poor                             ADL either performed or assessed with clinical judgement   ADL Overall ADL's : Needs assistance/impaired Eating/Feeding: Set up;Bed level   Grooming: Set up;Bed level   Upper Body Bathing: Minimal assistance;Sitting   Lower Body Bathing: Maximal assistance;Sit to/from stand;Sitting/lateral leans   Upper Body Dressing : Min guard;Sitting   Lower Body Dressing: Total assistance;Sit to/from stand;Sitting/lateral leans   Toilet Transfer: Moderate assistance;+2  for physical assistance;+2 for  safety/equipment;Cueing for safety;Cueing for sequencing;Stand-pivot;Rolling walker (2 wheels) Toilet Transfer Details (indicate cue type and reason): To chair. Patient is unsteady with global weakness and hypotensive. High fall risk. Toileting- Clothing Manipulation and Hygiene: Total assistance;Sit to/from stand;Sitting/lateral lean Toileting - Clothing Manipulation Details (indicate cue type and reason): Patient needing total A to doff soiled underwear and don clean brief     Functional mobility during ADLs: Moderate assistance;+2 for physical assistance;+2 for safety/equipment;Cueing for safety;Cueing for sequencing;Rolling walker (2 wheels) General ADL Comments: Patient needing increased assistance with self care tasks due to global weakness, impaired balance, hypotension      Pertinent Vitals/Pain Pain Assessment Pain Assessment: Faces Faces Pain Scale: Hurts even more Pain Location: Back, head Pain Descriptors / Indicators: Aching Pain Intervention(s): Premedicated before session     Hand Dominance Right   Extremity/Trunk Assessment Upper Extremity Assessment Upper Extremity Assessment: Generalized weakness   Lower Extremity Assessment Lower Extremity Assessment: Defer to PT evaluation       Communication Communication Communication: No difficulties   Cognition Arousal/Alertness: Awake/alert Behavior During Therapy: WFL for tasks assessed/performed Overall Cognitive Status: Within Functional Limits for tasks assessed                                                  Home Living Family/patient expects to be discharged to:: Private residence Living Arrangements: Spouse/significant other Available Help at Discharge: Family Type of Home: House Home Access: Stairs to enter Secretary/administrator of Steps: 3 Entrance Stairs-Rails: Right;Left Home Layout: One level;Laundry or work area in basement     [employer] Shower/Tub: Scientist, research (life sciences): Handicapped height     Home Equipment: Agricultural consultant (2 wheels);Cane - single point;Shower seat;BSC/3in1;Grab bars - tub/shower          Prior Functioning/Environment Prior Level of Function : Independent/Modified Independent             Mobility Comments: Ambulates with cane at baseline ADLs Comments: Patient reports spouse helps her in/out of tub shower otherwise is independent        OT Problem List: Decreased strength;Decreased activity tolerance;Impaired balance (sitting and/or standing);Decreased safety awareness;Pain;Cardiopulmonary status limiting activity;Decreased knowledge of use of DME or AE      OT Treatment/Interventions: Self-care/ADL training;Therapeutic exercise;Energy conservation;DME and/or AE instruction;Therapeutic activities;Patient/family education;Balance training    OT Goals(Current goals can be found in the care plan section) Acute Rehab OT Goals Patient Stated Goal: Get better OT Goal Formulation: With patient Time For Goal Achievement: 08/06/21 Potential to Achieve Goals: Good  OT Frequency: Min 2X/week       AM-PAC OT "6 Clicks" Daily Activity     Outcome Measure Help from another person eating meals?: A Little Help from another person taking care of personal grooming?: A Little Help from another person toileting, which includes using toliet, bedpan, or urinal?: Total Help from another person bathing (including washing, rinsing, drying)?: A Lot Help from another person to put on and taking off regular upper body clothing?: A Little Help from another person to put on and taking off regular lower body clothing?: Total 6 Click Score: 13   End of Session Equipment Utilized During Treatment: Rolling walker (2 wheels);Oxygen Nurse Communication: Mobility status;Other (comment) (orthostatic)  Activity Tolerance: Patient limited by fatigue;Treatment limited secondary to medical complications (Comment) (Orthostatic) Patient  left:  in bed;with call bell/phone within reach  OT Visit Diagnosis: Unsteadiness on feet (R26.81);Other abnormalities of gait and mobility (R26.89);Muscle weakness (generalized) (M62.81)                Time: 1191-4782 OT Time Calculation (min): 32 min Charges:  OT General Charges $OT Visit: 1 Visit OT Evaluation $OT Eval Low Complexity: 1 Low OT Treatments $Self Care/Home Management : 8-22 mins  Marlyce Huge OT OT pager: [phone]   Carmelia Roller 07/23/2021, 12:34 PM

## 2021-07-23 NOTE — Hospital Course (Addendum)
88 year old with past medical history significant for diastolic and systolic heart failure ejection fraction 45 to 50%, hypertension, chronic low back pain, compression fracture, B12 deficiency GERD who presents with generalized weakness, inability to ambulate.  She also complains of dry cough, shortness of breath, lower extremity edema. ? ?Patient admitted with acute on chronic heart failure exacerbation and AKI on CKD stage IV. She was diuresed. Lasix on hold due to increasing cr. Plan to monitor of Diuretics. She will need SNF at discharge.  ?

## 2021-07-23 NOTE — Consult Note (Signed)
?Cardiology Consultation:  ? ?Patient ID: Krystal Henderson ?MRN: [id number]; DOB: Jan 11, 1933 ? ?Admit date: 07/22/2021 ?Date of Consult: 07/23/2021 ? ?PCP:  Ann Held, DO ?  ?[facility] Providers ?Cardiologist:   EP- Dr Curt Bears; See Dr Haroldine Laws back in 2011-2012 ? ? ?Patient Profile:  ? ?Krystal Henderson is a 88 y.o. female with a hx of CKD IV,  HTN, HLD, GERD, OA, anxiety, PE 11/2019, paroxsymal A fib, chronic systolic and diastolic heart failure, neuropathy, who is being seen 07/23/2021 for the evaluation of acute CHF at the request of Dr. Tyrell Antonio.  ? ?History of Present Illness:  ? ?Krystal Henderson with above PMH presented to ER today with multiple complaints of weakness, headache, cough, mild nausea, shortness of breath.  ? ?Patient remotely saw Dr. Haroldine Laws back in 2011-2012 for chronic dyspnea. During that time, she had workup including Echo which showed EF 55% with mild AI and MR and restrictive diastolic filling pattern with very high LV filling pressures (E/E' > 25).  Myoview EF 65% with question of trivial anterior ischemia. Chest CT showed no PE, interstitial prominence interpreted as suggestive for pulmonary HTN.  She underwent cath August 2011, which showed normal coronaries. normal LV function. Mildly dilated Ao root. RA 2 RV 25/0, PA 23/7 (15), PCWP 5. Central aortic pressure was 145/69 with a mean of 99.  LV pressure 145/12 with an LVEDP of 10-15.  Fick cardiac output was 5.7, cardiac index 3.0.  Pulmonary vascular resistance was 1.7 Woods units.  Femoral artery saturation was 94% on room air.  Pulmonary artery saturation was 62% and 68%.  High SVC saturation was 63%. She was placed on GDMT with Quinapril, spironolactone, and lasix '20mg'$  daily. She was encouraged with exercise for decondition. She had resolved dyspnea on follow up 05/14/2010 visit.  ? ?She was referred to EP Dr Curt Bears 01/21/20 for evaluation of atrial fibrillation.   She was hospitalized 11/08/2019 for pulmonary embolism and  started on Eliquis.  She was diagnosed with atrial fibrillation on 11/10/2019 and was initiated on metoprolol. Echo from 12/27/17 with EF 60 to 65%, no RWMA, mild LVH, grade I DD, mild AR, mild MR, normal RV, ascending aorta mildly dilated 44m. Repeat Echo from 11/09/19 showed mild reduction of global LV systolic function, grade I DD, EF 45-50%, global  hypokinesis, elevated left atrial pressure, normal RV, moderately dilated LA, mild MR, mild AR.  Mild dilatation of aortic root 39 mm.  ? ?She was last seen by Dr. CCurt Bears8/22/2022 and complaining some fatigue, mostly asymptomatic from atrial fibrillation.  Cardiac monitor revealed 5% atrial fibrillation burden, <1% ventricular ectopy, 12.3% supraventricular ectopy, predominant underlying rhythm was sinus rhythm, 28 beat run of NSVT without symptoms. She was advised to stop metoprolol due to fatigue and started on diltiazem '120mg'$  for rate control.  ? ?Patient presented to the ER today complaining generalized weakness, states she was not able to stand up from her recliner.  She also reports headache, dry cough for the past 2 to 3 days, mild nausea without emesis, difficulty sleeping, back pain.  She reports shortness of breath as well as bilateral leg edema that worsened over the past few days.  She was found to be hypoxic at ED requiring 2 L nasal cannula oxygen.  Patient is overall a poor historian, offers multiple complaints.  She endorses generalized malaise and is feeling poor.  She does not sleep flat due to her back pain.  She has a dry nonproductive cough.  She  denies any chest pain or pressure, dizziness, syncope.  She had a skin cancer removed of her right lower leg recently by dermatology.  She states that she has been urinating.  She states she does not wish dialysis.  She noted her stool has been dark, but is not aware when this started.  She denies any active bleeding ? ?Admission diagnostic revealed chronic worsening anemia with hemoglobin 8.9/8 (from 10  ranges), CKD stage IV with worsening renal index Cr 3.54 BUN 46, and GFR 12. BNP 408. Hs trop negative x3. Lactic acid WNL. Procalcitonin negative. INR 1.7. Flu and COVID-19 negative. CXR showed Congestive heart failure. Left lower lobe consolidation concerning for pneumonia.  EKG showed atrial fibrillation with ventricular rate 54 bpm, low voltage QRS, non-specific ST-T abnormalities. She was admitted to hospital medicine service for CHF and CAP, started on IV Lasix '40mg'$  BID and antibiotic for pneumonia. Cardiology consult is requested for further recommendation of CHF.  ? ? ? ? ? ? ?Past Medical History:  ?Diagnosis Date  ? Anemia   ? hx of   ? Angiodysplasia 2007  ? @ colonoscopy  ? Anxiety   ? PMH of  ? Chronic low back pain 06/21/2014  ? Diverticulosis of colon (without mention of hemorrhage)   ? DJD (degenerative joint disease)   ? Esophageal reflux   ? inactive  ? History of vertebral fracture 11/2015  ? HTN (hypertension)   ? Hyperlipemia 2006  ? LDL 130  ? Hypoglycemia   ? reactive  ? Pelvic fracture ([place]) 12/30/2011  ? [facility]  ? Peripheral neuropathy   ? Personal history of colonic polyps   ? adenomatous  ? Vitamin B12 deficiency   ? ? ?Past Surgical History:  ?Procedure Laterality Date  ? cataract surgery  12-26-12 and 01-09-13  ? COLONOSCOPY  2011  ? neg  ? COLONOSCOPY W/ POLYPECTOMY    ? X 2 , Dr  Verl Blalock; angiodysplasia. Due 2022  ? DILATION AND CURETTAGE OF UTERUS    ? FACIAL COSMETIC SURGERY    ? HEMORROIDECTOMY    ? LUMBAR LAMINECTOMY/DECOMPRESSION MICRODISCECTOMY  09/10/2011  ? Procedure: LUMBAR LAMINECTOMY/DECOMPRESSION MICRODISCECTOMY;  Surgeon: Johnn Hai, MD;  Location: [facility];  Service: Orthopedics;  Laterality: N/A;  decompression laminectomy L2-3, L3-4, L4-5  ? ORIF WRIST FRACTURE  01/11/2012  ? Procedure: OPEN REDUCTION INTERNAL FIXATION (ORIF) WRIST FRACTURE;  Surgeon: Tennis Must, MD;  Location: [facility];  Service: Orthopedics;  Laterality: Right;  ? TEAR  DUCT PROBING    ? X 2  ? TOTAL HIP ARTHROPLASTY  2000  ? right  ? TUBAL LIGATION    ?  ? ?Home Medications:  ?Prior to Admission medications   ?Medication Sig Start Date End Date Taking? Authorizing Provider  ?ALPRAZolam (XANAX) 0.25 MG tablet Take 2-4 tablets (0.5-1 mg total) by mouth at bedtime as needed. Anxiety ?Patient taking differently: Take 0.5-1 mg by mouth at bedtime as needed for anxiety. Anxiety 08/07/20  Yes [name] R, DO  ?amLODipine (NORVASC) 5 MG tablet TAKE 1 TABLET(5 MG) BY MOUTH DAILY ?Patient taking differently: Take 5 mg by mouth every evening. 04/14/21  Yes Ann Held, DO  ?Ascorbic Acid (VITAMIN C) 1000 MG tablet Take 1,000 mg by mouth daily.   Yes [provider]  ?Calcium Carbonate (CALCIUM 600 PO) Take 600 mg by mouth daily.   Yes [provider]  ?Cholecalciferol (VITAMIN D) 2000 UNITS CAPS Take 2,000 Units by  mouth daily.   Yes [provider]  ?diltiazem (CARDIZEM CD) 120 MG 24 hr capsule TAKE 1 CAPSULE(120 MG) BY MOUTH DAILY ?Patient taking differently: 120 mg daily. 05/14/21  Yes Camnitz, Will Hassell Done, MD  ?ELIQUIS 2.5 MG TABS tablet TAKE 1 TABLET(2.5 MG) BY MOUTH TWICE DAILY ?Patient taking differently: Take 2.5 mg by mouth 2 (two) times daily. 05/14/21  Yes Camnitz, Ocie Doyne, MD  ?furosemide (LASIX) 40 MG tablet Take 1 tablet (40 mg total) by mouth daily. ?Patient taking differently: Take 20 mg by mouth daily. 02/12/21  Yes [name] R, DO  ?losartan (COZAAR) 100 MG tablet TAKE 1 TABLET BY MOUTH EVERY DAY ?Patient taking differently: Take 100 mg by mouth daily. 04/14/21  Yes [name] R, DO  ?LYRICA 100 MG capsule Take 1 capsule (100 mg total) by mouth at bedtime. 04/09/21  Yes Suzzanne Cloud, NP  ?Polyethyl Glycol-Propyl Glycol (SYSTANE ULTRA OP) Place 1 drop into both eyes daily as needed (for dry eyes).   Yes [provider]  ?doxycycline (VIBRAMYCIN) 100 MG capsule Take 100 mg by mouth 2 (two) times daily.  07/21/21   [provider]  ? ? ?Inpatient Medications: ?Scheduled Meds: ? vitamin C  1,000 mg Oral Daily  ? cholecalciferol  2,000 Units Oral Daily  ? [START ON 07/24/2021] diltiazem  30 mg Oral Q

## 2021-07-23 NOTE — Progress Notes (Signed)
Patient wet in spite of purewick, per nephrology order placed urethral catheter for accurate I&O in setting of IV diuresis.   ?

## 2021-07-23 NOTE — Consult Note (Signed)
Renal Service ?Consult Note ?[facility] ? ?[name] ?07/23/2021 ?Sol Blazing, MD ?Requesting Physician: Dr. Tyrell Antonio ? ?Reason for Consult: Renal failure ?HPI: The patient is a 88 y.o. year-old w/ hx of anemia, atrial fib, HL, DJD, HTN and CKD IV who presented 4/19 w/ c/o gen'd weakness and HA < 24 hrs. She could not get out of her recliner to stand up. +tremors, also dry cough, mild nausea. No fever. Has lots of chronic back pain but cannot have surgery for it. No CP. In ED pt was hypoxic and started on 2L O2. +leg edema and endorsed SOB.  CXR showed LLL consolidation and also pulm edema. Pt was admitted. Seen by cardiology. Echo showed EF 50-55%, mild-mod MR/ AR, felt to by vol overloaded as well. Started IV lasix 40 bid today. Asked to see for diuresis and consideration of dialysis if diuretics not effective.  ? ?Pt seen in room. Pt is 88 yo, walks w/ a walker. Much weaker today than usual per the husband. Main c/o is nausea and gen'd weakness, LE edema. Was f/b Dr Justin Mend at [facility], not sure who her new MD there is yet though.  ? ? ?ROS - denies CP, no joint pain, no HA, no blurry vision, no rash, no diarrhea, no nausea/ vomiting, no dysuria, no difficulty voiding ? ? ?Past Medical History  ?Past Medical History:  ?Diagnosis Date  ? Anemia   ? hx of   ? Angiodysplasia 2007  ? @ colonoscopy  ? Anxiety   ? PMH of  ? Chronic low back pain 06/21/2014  ? Diverticulosis of colon (without mention of hemorrhage)   ? DJD (degenerative joint disease)   ? Esophageal reflux   ? inactive  ? History of vertebral fracture 11/2015  ? HTN (hypertension)   ? Hyperlipemia 2006  ? LDL 130  ? Hypoglycemia   ? reactive  ? Pelvic fracture ([place]) 12/30/2011  ? [facility]  ? Peripheral neuropathy   ? Personal history of colonic polyps   ? adenomatous  ? Vitamin B12 deficiency   ? ?Past Surgical History  ?Past Surgical History:  ?Procedure Laterality Date  ? cataract surgery  12-26-12 and 01-09-13  ? COLONOSCOPY   2011  ? neg  ? COLONOSCOPY W/ POLYPECTOMY    ? X 2 , Dr  Verl Blalock; angiodysplasia. Due 2022  ? DILATION AND CURETTAGE OF UTERUS    ? FACIAL COSMETIC SURGERY    ? HEMORROIDECTOMY    ? LUMBAR LAMINECTOMY/DECOMPRESSION MICRODISCECTOMY  09/10/2011  ? Procedure: LUMBAR LAMINECTOMY/DECOMPRESSION MICRODISCECTOMY;  Surgeon: Johnn Hai, MD;  Location: [facility];  Service: Orthopedics;  Laterality: N/A;  decompression laminectomy L2-3, L3-4, L4-5  ? ORIF WRIST FRACTURE  01/11/2012  ? Procedure: OPEN REDUCTION INTERNAL FIXATION (ORIF) WRIST FRACTURE;  Surgeon: Tennis Must, MD;  Location: [facility];  Service: Orthopedics;  Laterality: Right;  ? TEAR DUCT PROBING    ? X 2  ? TOTAL HIP ARTHROPLASTY  2000  ? right  ? TUBAL LIGATION    ? ?Family History  ?Family History  ?Problem Relation Age of Onset  ? Heart attack Father 63  ? Kidney disease Mother   ?     renal failure  ? Throat cancer Sister   ?     smoker  ? Osteoporosis Sister   ? Heart attack Brother 69  ? Stroke Brother 51  ?     smoker  ? Depression Maternal Uncle   ?  Cancer Brother   ?     X3  lung cancer, all smokers  ? Peripheral vascular disease Daughter   ? Colon cancer Neg Hx   ? Diabetes Neg Hx   ? ?Social History  reports that she has never smoked. She has never used smokeless tobacco. She reports current alcohol use of about 3.0 standard drinks per week. She reports that she does not use drugs. ?Allergies  ?Allergies  ?Allergen Reactions  ? Codeine Nausea Only  ? Duloxetine Other (See Comments)  ?  REACTION: intolerance--She does not remember taking this Rx- states she did not feel well   ? Sertraline Hcl Other (See Comments)  ?  REACTION: intolerance-- Did not help with Depression  ? ?Home medications ?Prior to Admission medications   ?Medication Sig Start Date End Date Taking? Authorizing Provider  ?ALPRAZolam (XANAX) 0.25 MG tablet Take 2-4 tablets (0.5-1 mg total) by mouth at bedtime as needed. Anxiety ?Patient taking differently: Take  0.5-1 mg by mouth at bedtime as needed for anxiety. Anxiety 08/07/20  Yes [name] R, DO  ?amLODipine (NORVASC) 5 MG tablet TAKE 1 TABLET(5 MG) BY MOUTH DAILY ?Patient taking differently: Take 5 mg by mouth every evening. 04/14/21  Yes Ann Held, DO  ?Ascorbic Acid (VITAMIN C) 1000 MG tablet Take 1,000 mg by mouth daily.   Yes [provider]  ?Calcium Carbonate (CALCIUM 600 PO) Take 600 mg by mouth daily.   Yes [provider]  ?Cholecalciferol (VITAMIN D) 2000 UNITS CAPS Take 2,000 Units by mouth daily.   Yes [provider]  ?diltiazem (CARDIZEM CD) 120 MG 24 hr capsule TAKE 1 CAPSULE(120 MG) BY MOUTH DAILY ?Patient taking differently: 120 mg daily. 05/14/21  Yes Camnitz, Will Hassell Done, MD  ?ELIQUIS 2.5 MG TABS tablet TAKE 1 TABLET(2.5 MG) BY MOUTH TWICE DAILY ?Patient taking differently: Take 2.5 mg by mouth 2 (two) times daily. 05/14/21  Yes Camnitz, Ocie Doyne, MD  ?furosemide (LASIX) 40 MG tablet Take 1 tablet (40 mg total) by mouth daily. ?Patient taking differently: Take 20 mg by mouth daily. 02/12/21  Yes [name] R, DO  ?losartan (COZAAR) 100 MG tablet TAKE 1 TABLET BY MOUTH EVERY DAY ?Patient taking differently: Take 100 mg by mouth daily. 04/14/21  Yes [name] R, DO  ?LYRICA 100 MG capsule Take 1 capsule (100 mg total) by mouth at bedtime. 04/09/21  Yes Suzzanne Cloud, NP  ?Polyethyl Glycol-Propyl Glycol (SYSTANE ULTRA OP) Place 1 drop into both eyes daily as needed (for dry eyes).   Yes [provider]  ?doxycycline (VIBRAMYCIN) 100 MG capsule Take 100 mg by mouth 2 (two) times daily. 07/21/21   [provider]  ? ? ? ?Vitals:  ? 07/23/21 1200 07/23/21 1300 07/23/21 1400 07/23/21 1510  ?BP: (!) 95/51 (!) 96/59 (!) 94/51 (!) 101/59  ?Pulse: 72 72 74 68  ?Resp: _0 ?Temp:   98.2 ?F (36.8 ?C)   ?TempSrc:   Oral   ?SpO2: 98% 98% 95% 93%  ?Weight:      ?Height:      ? ?Exam ?Gen alert, quite frail, tremors, no  distress ?No rash, cyanosis or gangrene ?Sclera anicteric, throat clear  ?+jvd or bruits ?Chest rales R base, dec'd BS L base ?RRR no RG ?Abd soft ntnd no mass or ascites +bs ?GU defer ?MS no joint effusions or deformity ?Ext diffuse 2-3+ pitting edema from ankles to hips bilat ?Neuro Ox 3,  significant gen'd weakness, very frail and tremulous, nf ?  ? ? ? ? ? Home meds include - xanax, norvasc, cardizem 120 , eliquis, lasix 20 qd, losartan 100 qd, lyrica 100 hs, prns/ vits/ supps ? ?   ? ?      Date  Creat  eGFR ?    2019  2.0      ?    2020  1.90- 1.98        ?    2021  1.64- 2.21     ?   1st half 2022             2.12  21 ml/min      ?   2nd half 2022 2.50  17 ml/min ?    06/22/21   2.53  17 ml/min ?    4/19   3.52      ?    07/23/21  3.25    ? ?     BP's 90- 110 sbp / 50-70 dbp, HR 72 RR 16  temp 98 ?    Na 136  K 5.1  CO2 23  BUN 42  Creat 3.25  alb 2.9  wBC 5K  hb 8 ?      CXR 4/19 IMPRESSION: Interstitial prominence and indistinctness. Lungs are low in volume. Probable bilateral pleural effusions. Left lower lobe consolidation. Biapical pleural thickening.  ? ? ?Assessment/ Plan: ?AKI on CKD IV - b/l creatinine 2.1- 2.5 from 2022, egfr 17 - 55m/min.  Creat here 3.5 on admission in setting of marked volume overload, pulm edema and possible LLL pna. AKI likely due to decompensated CHF. Pt needs diuresis. Will ^IV lasix to 80 bid and titrate from there. Pt does not want to do dialysis, we discussed this at length.  She is a poor HD candidate in reality due to advanced age and significant frailty.  Suspect will take a week or longer to diurese. May need palliative input if declines. Will follow.  ?Acute CHF - vol overload from CHF and/or advancing CKD.  ?CAP - started IV abx ?Gen'd weakness - multifactorial ?Atrial fib - on eliquis and diltiazem ?Chronic back pain - another issue that would make HD difficult ?DNR ?  ? ? ? ?RKelly Splinter MD ?07/23/2021, 3:13 PM ?Recent Labs  ?Lab 07/22/21 ?1409 07/22/21 ?1735  07/23/21 ?0615  ?HGB 8.9*  --  8.0*  ?ALBUMIN  --   --  2.9*  ?CALCIUM  --  9.3 8.5*  ?PHOS  --  5.9*  --   ?CREATININE  --  3.54* 3.25*  ?K  --  4.8 5.1  ? ? ?

## 2021-07-23 NOTE — Evaluation (Signed)
Physical Therapy Evaluation ?Patient Details ?Name: Krystal Henderson ?MRN: [id number] ?DOB: 08-01-1932 ?Today's Date: 07/23/2021 ? ?History of Present Illness ? 88 y.o. female with medical history significant of  Diastolic, Systolic HF Ef 80-99 %, hypertension, chronic low back pain, compression fracture, GERD, B12 deficiency, who presents with generalized weakness, inability to stand up from her recliner. Dx of CHF, CAP  ?Clinical Impression ? Pt admitted with above diagnosis. Mod assist for bed mobility. When sitting at edge of bed pt became dizzy and nauseous, so assisted pt back to supine. Pitting edema noted BLEs, (L moreso than R). Pt is quite weak at present and may need ST-SNF if mobility remains limited.  Pt currently with functional limitations due to the deficits listed below (see PT Problem List). Pt will benefit from skilled PT to increase their independence and safety with mobility to allow discharge to the venue listed below.   ?   ?   ? ?Recommendations for follow up therapy are one component of a multi-disciplinary discharge planning process, led by the attending physician.  Recommendations may be updated based on patient status, additional functional criteria and insurance authorization. ? ?Follow Up Recommendations Skilled nursing-short term rehab (<3 hours/day) ? ?  ?Assistance Recommended at Discharge Frequent or constant Supervision/Assistance  ?Patient can return home with the following ? A lot of help with walking and/or transfers;A lot of help with bathing/dressing/bathroom;Direct supervision/assist for medications management;Assist for transportation;Help with stairs or ramp for entrance ? ?  ?Equipment Recommendations None recommended by PT  ?Recommendations for Other Services ?    ?  ?Functional Status Assessment Patient has had a recent decline in their functional status and demonstrates the ability to make significant improvements in function in a reasonable and predictable amount of time.   ? ?  ?Precautions / Restrictions Precautions ?Precautions: Fall ?Precaution Comments: Monitor BP-orthostatic; spouse reports 1 fall when getting out of bathtub 3 months ago (no other falls in 6 months) ?Restrictions ?Weight Bearing Restrictions: No  ? ?  ? ?Mobility ? Bed Mobility ?  ?Bed Mobility: Rolling, Sidelying to Sit, Sit to Supine ?Rolling: Max assist ?Sidelying to sit: Mod assist ?  ?Sit to supine: Max assist ?  ?General bed mobility comments: assist to initiate roll, advance LEs, and raise trunk. Pt reported dizziness/nausea in sitting so returned to supine ?  ? ?Transfers ?  ?  ?  ?  ?  ?  ?  ?  ?  ?  ?  ? ?Ambulation/Gait ?  ?  ?  ?  ?  ?  ?  ?  ? ?Stairs ?  ?  ?  ?  ?  ? ?Wheelchair Mobility ?  ? ?Modified Rankin (Stroke Patients Only) ?  ? ?  ? ?Balance Overall balance assessment: Needs assistance ?Sitting-balance support: Feet supported, Single extremity supported, Bilateral upper extremity supported ?Sitting balance-Leahy Scale: Poor ?  ?  ?  ?  ?  ?  ?  ?  ?  ?  ?  ?  ?  ?  ?  ?  ?   ? ? ? ?Pertinent Vitals/Pain Pain Assessment ?Pain Assessment: Faces ?Faces Pain Scale: Hurts little more ?Pain Location: back ?Pain Descriptors / Indicators: Aching ?Pain Intervention(s): Limited activity within patient's tolerance, Monitored during session, Premedicated before session, Repositioned  ? ? ?Home Living Family/patient expects to be discharged to:: Private residence ?Living Arrangements: Spouse/significant other ?Available Help at Discharge: Family;Available 24 hours/day ?Type of Home: House ?Home Access: Stairs to enter ?  Entrance Stairs-Rails: Right;Left ?Entrance Stairs-Number of Steps: 3 ?  ?Home Layout: One level;Laundry or work area in basement ?Home Equipment: Conservation officer, nature (2 wheels);Cane - single point;Shower seat;BSC/3in1;Grab bars - tub/shower ?   ?  ?Prior Function Prior Level of Function : Independent/Modified Independent ?  ?  ?  ?  ?  ?  ?Mobility Comments: Ambulates with cane at  baseline ?ADLs Comments: Patient reports spouse helps her in/out of tub shower otherwise is independent ?  ? ? ?Hand Dominance  ? Dominant Hand: Right ? ?  ?Extremity/Trunk Assessment  ? Upper Extremity Assessment ?Upper Extremity Assessment: Defer to OT evaluation ?  ? ?Lower Extremity Assessment ?Lower Extremity Assessment: Generalized weakness;RLE deficits/detail;LLE deficits/detail ?RLE Deficits / Details: unable to fully assess 2* nausea and pain, pitting edema noted BLEs ?LLE Deficits / Details: unable to fully assess 2* nausea and pain, pitting edema noted BLEs ?  ? ?Cervical / Trunk Assessment ?Cervical / Trunk Assessment: Kyphotic  ?Communication  ? Communication: No difficulties  ?Cognition Arousal/Alertness: Awake/alert ?Behavior During Therapy: [facility] for tasks assessed/performed ?Overall Cognitive Status: Within Functional Limits for tasks assessed ?  ?  ?  ?  ?  ?  ?  ?  ?  ?  ?  ?  ?  ?  ?  ?  ?  ?  ?  ? ?  ?General Comments   ? ?  ?Exercises    ? ?Assessment/Plan  ?  ?PT Assessment Patient needs continued PT services  ?PT Problem List Decreased strength;Decreased mobility;Decreased activity tolerance;Decreased balance;Pain ? ?   ?  ?PT Treatment Interventions Therapeutic activities;Therapeutic exercise;Gait training;Functional mobility training;Balance training;Patient/family education   ? ?PT Goals (Current goals can be found in the Care Plan section)  ?Acute Rehab PT Goals ?Patient Stated Goal: to get strength back ?PT Goal Formulation: With patient/family ?Time For Goal Achievement: 08/06/21 ?Potential to Achieve Goals: Good ? ?  ?Frequency Min 3X/week ?  ? ? ?Co-evaluation   ?  ?  ?  ?  ? ? ?  ?AM-PAC PT "6 Clicks" Mobility  ?Outcome Measure Help needed turning from your back to your side while in a flat bed without using bedrails?: A Lot ?Help needed moving from lying on your back to sitting on the side of a flat bed without using bedrails?: A Lot ?Help needed moving to and from a bed to a chair  (including a wheelchair)?: Total ?Help needed standing up from a chair using your arms (e.g., wheelchair or bedside chair)?: Total ?Help needed to walk in hospital room?: Total ?Help needed climbing 3-5 steps with a railing? : Total ?6 Click Score: 8 ? ?  ?End of Session   ?Activity Tolerance: Treatment limited secondary to medical complications (Comment);Patient limited by pain (nausa, back pain, dizziness) ?Patient left: in bed;with bed alarm set;with nursing/sitter in room;with family/visitor present;with call bell/phone within reach ?Nurse Communication: Mobility status ?PT Visit Diagnosis: Difficulty in walking, not elsewhere classified (R26.2);Pain;History of falling (Z91.81) ?  ? ?Time: 1157-2620 ?PT Time Calculation (min) (ACUTE ONLY): 15 min ? ? ?Charges:   PT Evaluation ?$PT Eval Moderate Complexity: 1 Mod ?  ?  ?   ? ?Blondell Reveal Kistler PT 07/23/2021  ?Acute Rehabilitation Services ?Pager [phone] ?Office [phone] ? ? ?

## 2021-07-23 NOTE — Progress Notes (Signed)
?  Progress Note ? ? ?Patient: Krystal Henderson INO:[id number] DOB: 1932/07/22 DOA: 07/22/2021     0 ?DOS: the patient was seen and examined on 07/23/2021 ?  ?Brief hospital course: ?88 year old with past medical history significant for diastolic and systolic heart failure ejection fraction 45 to 50%, hypertension, chronic low back pain, compression fracture, B12 deficiency GERD who presents with generalized weakness, inability to ambulate.  She also complains of dry cough, shortness of breath, lower extremity edema. ? ?Patient admitted with acute on chronic heart failure exacerbation and AKI on CKD stage IV. ? ?Assessment and Plan: ?* Acute exacerbation of CHF (congestive heart failure) ([place]) ?Patient presents with cough, SOB, elevation BNP, weakness, LE edema. Chest x ray with HF finding.  ?Continue with IV Lasix ?Check ECHO.  ?Monitor I and O. Daily weight.  ?Continue to monitor urine output. ?Weight on admission 160 pounds ?Cardiology consulted. ? ?Leg wound, right ?She has Right LE wound after excision of basal cell cancer 2 weeks ago.  ?She has some redness. Her dermatologist prescribe Doxycycline for 7 days to start on 4/19. ?Continue with antibiotics.  ? ?Headache ?CT head negative for acute finding.  ?Her pain is more scalp pain, irritation.  ?Needs to follow up with dermatologist  ? ?CKD (chronic kidney disease) stage 4, GFR 15-29 ml/min ([place]) ?AKI on CKD stage IV, concern for cardiorenal syndrome.  ?Cr per records 2.5---2.1 ?Cr on admission 3.5 ?Nephrology consulted to assist with diuresis.  ? ?Generalized weakness ?Suspect deconditioning, als related to HF, LE edema.  ?PT /OT consult.  ?If no improvement of LE weakness could consider MRI>  ? ?Paroxysmal atrial fibrillation (HCC) ?On diltiazem.  ?Cardiology holding eliquis due to anemia.  ? ?CAP (community acquired pneumonia) ?She report cough. Chest x ray Left Lower lobe atelectasis vs consolidations.  ?Pro-calcitonin not significantly elevated but will  Continue  ceftriaxone and Doxy to cover for PNA and LE cellulitis.  ? ?Normocytic anemia ?Hb trending down, hb one moth ago at 10.  ?Hb on admission 8.9---8.0. ?Checking iron level and for occult blood.  ? ?Peripheral neuropathy ?Continue with lyrica.  ? ?Vitamin B12 deficiency ?On supplement.  ? ?DJD (degenerative joint disease) ?Vicodin  for chronic back pain and headaches.  ? ?GERD (gastroesophageal reflux disease) ?Continue with PPI.  ? ?HTN (hypertension) ?BP soft, hold amlodipine and Cozaar.  ? ? ? ? ?  ? ?Subjective: she report improvement of dyspnea and cough. Still having back pain . Report dark brown stool.  ? ?Physical Exam: ?Vitals:  ? 07/23/21 1001 07/23/21 1100 07/23/21 1200 07/23/21 1300  ?BP: 108/84 (!) 98/44 (!) 95/51 (!) 96/59  ?Pulse:  (!) 59 72 72  ?Resp:  '11 13 14  '$ ?Temp:      ?TempSrc:      ?SpO2:  95% 98% 98%  ?Weight:      ?Height:      ? ?General; On 2 L oxygen, in no distress.  ?CVS; S 1, S 2 IRR ?Lungs; BL crackles.  ? ?Data Reviewed: ? ?Bmet, cbc ? ?Family Communication: Husband at bedside.  ? ?Disposition: ?Status is: Observation ?The patient remains OBS appropriate and will d/c before 2 midnights. ? Planned Discharge Destination: Home ? ? ? ?Time spent: 35 minutes ? ?Author: ?Elmarie Shiley, MD ?07/23/2021 1:46 PM ? ?For on call review [email or web address].  ?

## 2021-07-23 NOTE — Significant Event (Addendum)
Patient noted to be hypothermic persistently.  Placed on warm blankets temperature is slowly coming up from 93 ?F and it is around 95.8.  Ordered blood cultures and continue ceftriaxone and doxycycline.  In addition we will check TSH and cortisol.  We will monitor closely. ? ?Gean Birchwood ?

## 2021-07-23 NOTE — ED Notes (Signed)
PT at bedside.

## 2021-07-23 NOTE — Progress Notes (Signed)
Echocardiogram ?2D Echocardiogram has been performed. ? ?Arlyss Gandy ?07/23/2021, 8:09 AM ?

## 2021-07-23 NOTE — Consult Note (Signed)
WOC Nurse Consult Note: ?Reason for Consult: Right anterior lower leg with squamous cell lesion removed two weeks ago.  Dermatology recommends petrolatum dressing changes.  Will implement vaseline gauze daily.  ?Wound type: cancerous lesion removal ?Pressure Injury POA: NA ?Measurement: 2 cm x 1.3 cm  dark wound bed ?Wound SUN:HRVACQPE tissue.  Punched out appearance ?Drainage (amount, consistency, odor) None, dry ?Periwound:intact ?Dressing procedure/placement/frequency: Cleanse wound with NS and pat dry. Apply vaseline gauze (doubled over) to wound bed. Cover with foam dressing.  Change daily. ? Will not follow at this time.  Please re-consult if needed.  ?Domenic Moras MSN, RN, FNP-BC CWON ?Wound, Ostomy, Continence Nurse ?Pager [phone]  ?  ?

## 2021-07-24 ENCOUNTER — Ambulatory Visit: Payer: Medicare Other

## 2021-07-24 DIAGNOSIS — I5032 Chronic diastolic (congestive) heart failure: Secondary | ICD-10-CM

## 2021-07-24 DIAGNOSIS — I5023 Acute on chronic systolic (congestive) heart failure: Secondary | ICD-10-CM | POA: Diagnosis not present

## 2021-07-24 DIAGNOSIS — J189 Pneumonia, unspecified organism: Secondary | ICD-10-CM

## 2021-07-24 DIAGNOSIS — I509 Heart failure, unspecified: Secondary | ICD-10-CM

## 2021-07-24 LAB — CBC
HCT: 25 % — ABNORMAL LOW (ref 36.0–46.0)
Hemoglobin: 8 g/dL — ABNORMAL LOW (ref 12.0–15.0)
MCH: 28.4 pg (ref 26.0–34.0)
MCHC: 32 g/dL (ref 30.0–36.0)
MCV: 88.7 fL (ref 80.0–100.0)
Platelets: 292 10*3/uL (ref 150–400)
RBC: 2.82 MIL/uL — ABNORMAL LOW (ref 3.87–5.11)
RDW: 17.6 % — ABNORMAL HIGH (ref 11.5–15.5)
WBC: 6.1 10*3/uL (ref 4.0–10.5)
nRBC: 0 % (ref 0.0–0.2)

## 2021-07-24 LAB — RENAL FUNCTION PANEL
Albumin: 3 g/dL — ABNORMAL LOW (ref 3.5–5.0)
Anion gap: 8 (ref 5–15)
BUN: 44 mg/dL — ABNORMAL HIGH (ref 8–23)
CO2: 26 mmol/L (ref 22–32)
Calcium: 9.1 mg/dL (ref 8.9–10.3)
Chloride: 102 mmol/L (ref 98–111)
Creatinine, Ser: 3.03 mg/dL — ABNORMAL HIGH (ref 0.44–1.00)
GFR, Estimated: 14 mL/min — ABNORMAL LOW (ref >60)
Glucose, Bld: 87 mg/dL (ref 70–99)
Phosphorus: 6.7 mg/dL — ABNORMAL HIGH (ref 2.5–4.6)
Potassium: 4.8 mmol/L (ref 3.5–5.1)
Sodium: 136 mmol/L (ref 135–145)

## 2021-07-24 LAB — CORTISOL: Cortisol, Plasma: 15.9 ug/dL

## 2021-07-24 MED ORDER — SODIUM CHLORIDE 0.9 % IV SOLN
250.0000 mg | Freq: Once | INTRAVENOUS | Status: AC
  Administered 2021-07-24: 250 mg via INTRAVENOUS
  Filled 2021-07-24: qty 20

## 2021-07-24 MED ORDER — METOLAZONE 5 MG PO TABS
5.0000 mg | ORAL_TABLET | Freq: Every day | ORAL | Status: DC
  Administered 2021-07-24 – 2021-07-26 (×3): 5 mg via ORAL
  Filled 2021-07-24 (×3): qty 1

## 2021-07-24 MED ORDER — FUROSEMIDE 10 MG/ML IJ SOLN
100.0000 mg | Freq: Three times a day (TID) | INTRAVENOUS | Status: DC
  Administered 2021-07-24 – 2021-07-25 (×3): 100 mg via INTRAVENOUS
  Filled 2021-07-24 (×4): qty 10

## 2021-07-24 MED ORDER — APIXABAN 2.5 MG PO TABS
2.5000 mg | ORAL_TABLET | Freq: Two times a day (BID) | ORAL | Status: DC
  Administered 2021-07-24 – 2021-08-01 (×16): 2.5 mg via ORAL
  Filled 2021-07-24 (×16): qty 1

## 2021-07-24 NOTE — Progress Notes (Signed)
Physical Therapy Treatment ?Patient Details ?Name: Krystal Henderson ?MRN: [id number] ?DOB: 11-08-1932 ?Today's Date: 07/24/2021 ? ? ?History of Present Illness 88 y.o. female with medical history significant of  Diastolic, Systolic HF Ef 73-22 %, hypertension, chronic low back pain, compression fracture, GERD, B12 deficiency, who presents with generalized weakness, inability to stand up from her recliner. Dx of CHF, CAP ? ?  ?PT Comments  ? ? Pt tolerated increased activity level today. +2 max assist for bed mobility, +2 mod Assist for sit to stand. Pt took several pivotal steps with RW to transfer to recliner. Further ambulation was deferred 2* fatigue. Performed BLE strengthening exercises with assistance.  ?   ?Recommendations for follow up therapy are one component of a multi-disciplinary discharge planning process, led by the attending physician.  Recommendations may be updated based on patient status, additional functional criteria and insurance authorization. ? ?Follow Up Recommendations ? Skilled nursing-short term rehab (<3 hours/day) ?  ?  ?Assistance Recommended at Discharge Frequent or constant Supervision/Assistance  ?Patient can return home with the following A lot of help with walking and/or transfers;A lot of help with bathing/dressing/bathroom;Direct supervision/assist for medications management;Assist for transportation;Help with stairs or ramp for entrance ?  ?Equipment Recommendations ? None recommended by PT  ?  ?Recommendations for Other Services   ? ? ?  ?Precautions / Restrictions Precautions ?Precautions: Fall ?Precaution Comments: Monitor BP-orthostatic; spouse reports 1 fall when getting out of bathtub 3 months ago (no other falls in 6 months) ?Restrictions ?Weight Bearing Restrictions: No  ?  ? ?Mobility ? Bed Mobility ?  ?Bed Mobility: Rolling ?Rolling: Mod assist ?Sidelying to sit: Max assist, +2 for physical assistance ?  ?  ?  ?General bed mobility comments: assist to initiate roll, max  A to raise trunk; posterior lean in sitting requiring mod A initially ?  ? ?Transfers ?Overall transfer level: Needs assistance ?Equipment used: Rolling walker (2 wheels) ?Transfers: Sit to/from Stand, Bed to chair/wheelchair/BSC ?Sit to Stand: Mod assist, +2 physical assistance, +2 safety/equipment ?  ?Step pivot transfers: Min assist, +2 safety/equipment ?  ?  ?  ?General transfer comment: assist to power up; SPT to recliner with RW, VCs for technique/safety, min A for balance ?  ? ?Ambulation/Gait ?  ?  ?  ?  ?  ?  ?  ?  ? ? ?Stairs ?  ?  ?  ?  ?  ? ? ?Wheelchair Mobility ?  ? ?Modified Rankin (Stroke Patients Only) ?  ? ? ?  ?Balance Overall balance assessment: Needs assistance ?Sitting-balance support: Feet supported, Single extremity supported, Bilateral upper extremity supported ?Sitting balance-Leahy Scale: Poor ?Sitting balance - Comments: mod A for posterior lean initially, then supervision ?Postural control: Posterior lean ?Standing balance support: During functional activity, Reliant on assistive device for balance ?Standing balance-Leahy Scale: Poor ?  ?  ?  ?  ?  ?  ?  ?  ?  ?  ?  ?  ?  ? ?  ?Cognition Arousal/Alertness: Awake/alert ?Behavior During Therapy: [facility] for tasks assessed/performed ?Overall Cognitive Status: Within Functional Limits for tasks assessed ?  ?  ?  ?  ?  ?  ?  ?  ?  ?  ?  ?  ?  ?  ?  ?  ?  ?  ?  ? ?  ?Exercises General Exercises - Lower Extremity ?Ankle Circles/Pumps: AROM, Both, 10 reps, Supine ?Heel Slides: AAROM, Both, 10 reps, Supine ?Hip ABduction/ADduction: AAROM, Both, 10 reps,  Supine ?Hip Flexion/Marching: AROM, Both, 5 reps, Standing ? ?  ?General Comments   ?  ?  ? ?Pertinent Vitals/Pain Pain Assessment ?Faces Pain Scale: Hurts even more ?Pain Location: L hip with initial movement, then eased ?Pain Descriptors / Indicators: Aching ?Pain Intervention(s): Limited activity within patient's tolerance, Monitored during session, Repositioned  ? ? ?Home Living   ?  ?  ?  ?  ?  ?   ?  ?  ?  ?   ?  ?Prior Function    ?  ?  ?   ? ?PT Goals (current goals can now be found in the care plan section) Acute Rehab PT Goals ?Patient Stated Goal: to get strength back ?PT Goal Formulation: With patient/family ?Time For Goal Achievement: 08/06/21 ?Potential to Achieve Goals: Good ?Progress towards PT goals: Progressing toward goals ? ?  ?Frequency ? ? ? Min 3X/week ? ? ? ?  ?PT Plan Current plan remains appropriate  ? ? ?Co-evaluation   ?  ?  ?  ?  ? ?  ?AM-PAC PT "6 Clicks" Mobility   ?Outcome Measure ? Help needed turning from your back to your side while in a flat bed without using bedrails?: A Lot ?Help needed moving from lying on your back to sitting on the side of a flat bed without using bedrails?: A Lot ?Help needed moving to and from a bed to a chair (including a wheelchair)?: A Lot ?Help needed standing up from a chair using your arms (e.g., wheelchair or bedside chair)?: A Lot ?Help needed to walk in hospital room?: Total ?Help needed climbing 3-5 steps with a railing? : Total ?6 Click Score: 10 ? ?  ?End of Session Equipment Utilized During Treatment: Gait belt ?Activity Tolerance: Patient limited by fatigue;Patient tolerated treatment well ?Patient left: with family/visitor present;with call bell/phone within reach;in chair;with chair alarm set ?Nurse Communication: Mobility status ?PT Visit Diagnosis: Difficulty in walking, not elsewhere classified (R26.2);Pain;History of falling (Z91.81) ?Pain - Right/Left: Left ?Pain - part of body: Hip ?  ? ? ?Time: 9024-0973 ?PT Time Calculation (min) (ACUTE ONLY): 19 min ? ?Charges:  $Therapeutic Activity: 8-22 mins          ?          ? ?Philomena Doheny PT 07/24/2021  ?Acute Rehabilitation Services ?Pager [phone] ?Office [phone] ? ? ?

## 2021-07-24 NOTE — Progress Notes (Signed)
?Progress Note ? ? ?Patient: Krystal Henderson PJA:[id number] DOB: [birth year] DOA: 07/22/2021     1 ?DOS: the patient was seen and examined on 07/24/2021 ?  ?Brief hospital course: ?88 year old with past medical history significant for diastolic and systolic heart failure ejection fraction 45 to 50%, hypertension, chronic low back pain, compression fracture, B12 deficiency GERD who presents with generalized weakness, inability to ambulate.  She also complains of dry cough, shortness of breath, lower extremity edema. ? ?Patient admitted with acute on chronic heart failure exacerbation and AKI on CKD stage IV. ? ?Assessment and Plan: ?* Acute exacerbation of CHF (congestive heart failure) ([place]) ?Patient presents with cough, SOB, elevation BNP, weakness, LE edema. Chest x ray with HF finding.  ?Continue with IV Lasix, change to 100 mg IV TID by nephrologist.  ?ECHO; Ef 55 % ?Weight on admission 160 pounds--184 ?Cardiology consulted and sign off.  ?Started on Metolazone.  ? ?Leg wound, right ?She has Right LE wound after excision of basal cell cancer 2 weeks ago.  ?She has some redness. Her dermatologist prescribe Doxycycline for 7 days to start on 4/19. ?Continue with antibiotics.  ?Hypothermia last night. Follow blood culture.  ? ?Headache ?CT head negative for acute finding.  ?Her pain is more scalp pain, irritation.  ?Needs to follow up with dermatologist  ? ?CKD (chronic kidney disease) stage 4, GFR 15-29 ml/min ([place]) ?AKI on CKD stage IV, concern for cardiorenal syndrome.  ?Cr per records 2.5---2.1 ?Cr on admission 3.5--3.0 ?Nephrology consulted to assist with diuresis.  ?Lasix dose increased, start metolazone. Renal US no hydronephrosis.  ? ?Generalized weakness ?Suspect deconditioning, als related to HF, LE edema.  ?PT /OT consult.  ?If no improvement of LE weakness could consider MRI>  ? ?Paroxysmal atrial fibrillation (HCC) ?On diltiazem dose reduce.  ?GI, recommend to resume eliquis and monitor.  ? ?CAP  (community acquired pneumonia) ?She report cough. Chest x ray Left Lower lobe atelectasis vs consolidations.  ?Pro-calcitonin not significantly elevated but will Continue  ceftriaxone and Doxy to cover for PNA and LE cellulitis. Plan for 7 days antibiotics.  ? ?Normocytic anemia ?Hb trending down, hb one moth ago at 10.  ?Hb on admission 8.9---8.0. ?Iron: 72 T sat 10. Ferritin 21/ will discussed with nephrology in regards aranesp/Iron. ?GI consulted, and recommend to monitor patient on eliquis.  ? ?Peripheral neuropathy ?Continue with lyrica.  ? ?Vitamin B12 deficiency ?On supplement.  ? ?DJD (degenerative joint disease) ?Vicodin  for chronic back pain and headaches.  ? ?GERD (gastroesophageal reflux disease) ?Continue with PPI.  ? ?HTN (hypertension) ?BP soft, hold amlodipine and Cozaar.  ? ? ? ? ?  ? ?Subjective: she is more confuse today. Asking for her husband, who later arrives t room.  ?She denies chest pain.  ? ? ?Physical Exam: ?Vitals:  ? 07/24/21 0345 07/24/21 0348 07/24/21 0740 07/24/21 1329  ?BP: (!) 104/56  (!) 104/41 (!) 94/37  ?Pulse: 69  79 68  ?Resp: '16  14 14  '$ ?Temp: (!) 97.3 ?F (36.3 ?C)  97.7 ?F (36.5 ?C) 98.4 ?F (36.9 ?C)  ?TempSrc: Oral  Oral Oral  ?SpO2: 92%  91% 95%  ?Weight:  83.5 kg    ?Height:      ? ?General; NAD ?CVS; S 1., S 2 RRR ?Extremities; plus 2 edema ? ?Data Reviewed: ? ?Cbc and bmet ? ?Family Communication: Husband who was at bedside.  ? ?Disposition: ?Status is: Inpatient ?Remains inpatient appropriate because: management of renal failure.  ? Planned Discharge  Destination: Skilled nursing facility ? ? ? ?Time spent: 35 minutes ? ?Author: ?Elmarie Shiley, MD ?07/24/2021 3:05 PM ? ?For on call review [email or web address].  ?

## 2021-07-24 NOTE — Consult Note (Addendum)
? ?Referring Provider: Dr. Tyrell Antonio, [facility] ?Primary Care Physician:  Carollee Herter, Alferd Apa, DO ?Primary Gastroenterologist:  Dr. Sharlett Iles, last seen in 2012 ? ?Reason for Consultation:  Anemia ? ?HPI: Krystal Henderson is a 88 y.o. female with medical history significant for CHF, hypertension, chronic low back pain, compression fracture, GERD, B12 deficiency, atrial fibrillation on Eliquis, CKD stage 4 who presented to [facility] ED with generalized weakness, inability to stand up from her recliner. ? ?Is being treated for CHF exacerbation, on antibiotics for pneumonia, had some acute on chronic kidney injury. ? ?While she is here her hemoglobin has trended down to 8.0 g from 10.7 g one month ago.  MCV is normal at 88.7.  Iron studies are borderline.  Her home Eliquis has been put on hold with last dose 4/20 at 10:00 AM.  Cardiology had recommended GI consult to rule out GI bleed and she would not be safe to restart her Eliquis.  She is on oxygen here, does not use oxygen at home. ? ?She tells me that she has constipation at home and has a lot of straining.  She sees some bright red blood on the toilet paper intermittently with that, but no other evidence of black or bloody stools.  No bowel movements since she has been here.  Hemoccult has been ordered, but not performed since there has been no bowel movement.  She denies abdominal pain. ? ?Colonoscopy July 2011 showed only scattered diverticula throughout the colon. ? ?EGD July 2011 was normal. ? ?Colonoscopy September 2007 had one 4 mm AVM in the cecum. ? ? ?Past Medical History:  ?Diagnosis Date  ? Anemia   ? hx of   ? Angiodysplasia 2007  ? @ colonoscopy  ? Anxiety   ? PMH of  ? Chronic low back pain 06/21/2014  ? Diverticulosis of colon (without mention of hemorrhage)   ? DJD (degenerative joint disease)   ? Esophageal reflux   ? inactive  ? History of vertebral fracture 11/2015  ? HTN (hypertension)   ? Hyperlipemia 2006  ? LDL 130  ? Hypoglycemia   ? reactive  ?  Pelvic fracture ([place]) 12/30/2011  ? [facility]  ? Peripheral neuropathy   ? Personal history of colonic polyps   ? adenomatous  ? Vitamin B12 deficiency   ? ? ?Past Surgical History:  ?Procedure Laterality Date  ? cataract surgery  12-26-12 and 01-09-13  ? COLONOSCOPY  2011  ? neg  ? COLONOSCOPY W/ POLYPECTOMY    ? X 2 , Dr  Verl Blalock; angiodysplasia. Due 2022  ? DILATION AND CURETTAGE OF UTERUS    ? FACIAL COSMETIC SURGERY    ? HEMORROIDECTOMY    ? LUMBAR LAMINECTOMY/DECOMPRESSION MICRODISCECTOMY  09/10/2011  ? Procedure: LUMBAR LAMINECTOMY/DECOMPRESSION MICRODISCECTOMY;  Surgeon: Johnn Hai, MD;  Location: [facility];  Service: Orthopedics;  Laterality: N/A;  decompression laminectomy L2-3, L3-4, L4-5  ? ORIF WRIST FRACTURE  01/11/2012  ? Procedure: OPEN REDUCTION INTERNAL FIXATION (ORIF) WRIST FRACTURE;  Surgeon: Tennis Must, MD;  Location: [facility];  Service: Orthopedics;  Laterality: Right;  ? TEAR DUCT PROBING    ? X 2  ? TOTAL HIP ARTHROPLASTY  2000  ? right  ? TUBAL LIGATION    ? ? ?Prior to Admission medications   ?Medication Sig Start Date End Date Taking? Authorizing Provider  ?ALPRAZolam (XANAX) 0.25 MG tablet Take 2-4 tablets (0.5-1 mg total) by mouth at bedtime as needed. Anxiety ?Patient taking differently:  Take 0.5-1 mg by mouth at bedtime as needed for anxiety. Anxiety 08/07/20  Yes [name] R, DO  ?amLODipine (NORVASC) 5 MG tablet TAKE 1 TABLET(5 MG) BY MOUTH DAILY ?Patient taking differently: Take 5 mg by mouth every evening. 04/14/21  Yes Ann Held, DO  ?Ascorbic Acid (VITAMIN C) 1000 MG tablet Take 1,000 mg by mouth daily.   Yes [provider]  ?Calcium Carbonate (CALCIUM 600 PO) Take 600 mg by mouth daily.   Yes [provider]  ?Cholecalciferol (VITAMIN D) 2000 UNITS CAPS Take 2,000 Units by mouth daily.   Yes [provider]  ?diltiazem (CARDIZEM CD) 120 MG 24 hr capsule TAKE 1 CAPSULE(120 MG) BY MOUTH DAILY ?Patient  taking differently: 120 mg daily. 05/14/21  Yes Camnitz, Will Hassell Done, MD  ?ELIQUIS 2.5 MG TABS tablet TAKE 1 TABLET(2.5 MG) BY MOUTH TWICE DAILY ?Patient taking differently: Take 2.5 mg by mouth 2 (two) times daily. 05/14/21  Yes Camnitz, Ocie Doyne, MD  ?furosemide (LASIX) 40 MG tablet Take 1 tablet (40 mg total) by mouth daily. ?Patient taking differently: Take 20 mg by mouth daily. 02/12/21  Yes [name] R, DO  ?losartan (COZAAR) 100 MG tablet TAKE 1 TABLET BY MOUTH EVERY DAY ?Patient taking differently: Take 100 mg by mouth daily. 04/14/21  Yes [name] R, DO  ?LYRICA 100 MG capsule Take 1 capsule (100 mg total) by mouth at bedtime. 04/09/21  Yes Suzzanne Cloud, NP  ?Polyethyl Glycol-Propyl Glycol (SYSTANE ULTRA OP) Place 1 drop into both eyes daily as needed (for dry eyes).   Yes [provider]  ?doxycycline (VIBRAMYCIN) 100 MG capsule Take 100 mg by mouth 2 (two) times daily. 07/21/21   [provider]  ? ? ?Current Facility-Administered Medications  ?Medication Dose Route Frequency Provider Last Rate Last Admin  ? 0.9 %  sodium chloride infusion  250 mL Intravenous PRN Regalado, Belkys A, MD      ? acetaminophen (TYLENOL) tablet 650 mg  650 mg Oral Q6H PRN Regalado, Belkys A, MD      ? Or  ? acetaminophen (TYLENOL) suppository 650 mg  650 mg Rectal Q6H PRN Regalado, Belkys A, MD      ? ALPRAZolam Duanne Moron) tablet 0.5 mg  0.5 mg Oral QHS PRN Regalado, Belkys A, MD      ? ascorbic acid (VITAMIN C) tablet 1,000 mg  1,000 mg Oral Daily Regalado, Belkys A, MD   1,000 mg at 07/24/21 0910  ? cefTRIAXone (ROCEPHIN) 2 g in sodium chloride 0.9 % 100 mL IVPB  2 g Intravenous Q24H Regalado, Belkys A, MD 200 mL/hr at 07/23/21 1844 2 g at 07/23/21 1844  ? Chlorhexidine Gluconate Cloth 2 % PADS 6 each  6 each Topical Daily Regalado, Belkys A, MD   6 each at 07/24/21 0920  ? cholecalciferol (VITAMIN D) tablet 2,000 Units  2,000 Units Oral Daily Regalado, Belkys A, MD   2,000 Units at 07/24/21  0910  ? diltiazem (CARDIZEM) tablet 30 mg  30 mg Oral Q12H Margie Billet, NP   30 mg at 07/24/21 4825  ? docusate sodium (COLACE) capsule 100 mg  100 mg Oral BID Regalado, Belkys A, MD   100 mg at 07/24/21 0910  ? doxycycline (VIBRAMYCIN) 100 mg in sodium chloride 0.9 % 250 mL IVPB  100 mg Intravenous Q12H Regalado, Belkys A, MD 125 mL/hr at 07/24/21 0426 100 mg at 07/24/21 0426  ? furosemide (LASIX) 100 mg in dextrose  5 % 50 mL IVPB  100 mg Intravenous Q8H Roney Jaffe, MD      ? HYDROcodone-acetaminophen (NORCO/VICODIN) 5-325 MG per tablet 1 tablet  1 tablet Oral Q6H PRN Regalado, Belkys A, MD   1 tablet at 07/24/21 0552  ? metolazone (ZAROXOLYN) tablet 5 mg  5 mg Oral Daily Roney Jaffe, MD      ? ondansetron [facility]) tablet 4 mg  4 mg Oral Q6H PRN Regalado, Belkys A, MD      ? Or  ? ondansetron (ZOFRAN) injection 4 mg  4 mg Intravenous Q6H PRN Regalado, Belkys A, MD   4 mg at 07/23/21 1532  ? pantoprazole (PROTONIX) EC tablet 40 mg  40 mg Oral BID Regalado, Belkys A, MD   40 mg at 07/24/21 0909  ? polyvinyl alcohol (LIQUIFILM TEARS) 1.4 % ophthalmic solution 1 drop  1 drop Both Eyes Daily PRN Regalado, Belkys A, MD      ? pregabalin (LYRICA) capsule 100 mg  100 mg Oral QHS Regalado, Belkys A, MD   100 mg at 07/23/21 2300  ? senna (SENOKOT) tablet 8.6 mg  1 tablet Oral BID Regalado, Belkys A, MD   8.6 mg at 07/24/21 0910  ? sodium chloride flush (NS) 0.9 % injection 3 mL  3 mL Intravenous Q12H Regalado, Belkys A, MD   3 mL at 07/24/21 0920  ? sodium chloride flush (NS) 0.9 % injection 3 mL  3 mL Intravenous PRN Regalado, Belkys A, MD      ? ? ?Allergies as of 07/22/2021 - Review Complete 07/22/2021  ?Allergen Reaction Noted  ? Codeine Nausea Only 03/09/2010  ? Duloxetine Other (See Comments) 03/09/2010  ? Sertraline hcl Other (See Comments) 03/09/2010  ? ? ?Family History  ?Problem Relation Age of Onset  ? Heart attack Father 75  ? Kidney disease Mother   ?     renal failure  ? Throat cancer Sister   ?      smoker  ? Osteoporosis Sister   ? Heart attack Brother 45  ? Stroke Brother 54  ?     smoker  ? Depression Maternal Uncle   ? Cancer Brother   ?     X3  lung cancer, all smokers  ? Peripheral vascular disease D

## 2021-07-24 NOTE — Progress Notes (Addendum)
[facility] ?Progress Note ? ?Subjective: pt seen in room. UOP 89 yest and 89 today so far.  ? ?Vitals:  ? 07/24/21 0120 07/24/21 0300 07/24/21 0345 07/24/21 0348  ?BP:   (!) 104/56   ?Pulse:   69   ?Resp:   16   ?Temp: (!) 97.1 ?F (36.2 ?C) (!) 97.1 ?F (36.2 ?C) (!) 97.3 ?F (36.3 ?C)   ?TempSrc: Rectal Oral Oral   ?SpO2: 94%  92%   ?Weight:    83.5 kg  ?Height:      ? ? ?Exam: ?Gen alert, quite frail, tremors, no distress ?+jvd or bruits ?Chest rales R base, dec'd BS L base ?RRR no RG ?Abd soft ntnd no mass or ascites +bs ?Ext 2-3+ edema ankles to hips bilat ?Neuro Ox 3, significant gen weakness, nf ?  ?  ?  ?  ?  ? Home meds include - xanax, norvasc, cardizem 120 , eliquis, lasix 20 qd, losartan 100 qd, lyrica 100 hs, prns/ vits/ supps ?  ?   ?  ?      Date                      Creat               eGFR ?    2019                        2.0                                                        ?    2020                        1.90- 1.98                                     ?    2021                        1.64- 2.21                                             ?   1st half 2022                        2.12                 21 ml/min                                  ?   2nd half 2022           2.50                 17 ml/min ?    06/22/21                    2.53  17 ml/min ?    4/19                         3.52                                                      ?    07/23/21                    3.25                              ?  ?     BP's 90- 110 sbp / 50-70 dbp, HR 72 RR 16  temp 98 ?    Na 136  K 5.1  CO2 23  BUN 42  Creat 3.25  alb 2.9  wBC 5K  hb 8 ?      CXR 4/19 IMPRESSION: Interstitial prominence and indistinctness. Lungs are low in volume. Probable bilateral pleural effusions. Left lower lobe consolidation. Biapical pleural thickening.  ?  ?  ?Assessment/ Plan: ?AKI on CKD IV - b/l creatinine 2.1- 2.5 from 2022, egfr 17 - 77m/min.  Creat 3.5 on admit here in setting of marked  volume overload, pulm edema, possible LLL pna. AKI due to decompensated CHF. Getting IV lasix 80 bid >  will ^ to 100 tid and add zaroxolyn 5 qd. Renal function stable. F/u lab in am.  ?GOC - pt does not want to do dialysis.  She is a poor HD candidate in reality due to advanced age and significant frailty.  May need palliative input if declines.  ?Acute CHF - w/ peripheral and pulm edema ?CAP - started IV abx ?Gen'd weakness - multifactorial ?Atrial fib - on eliquis and diltiazem ?Chronic back pain ?DNR ?  ?  ?  ?  ? ? ? ?Krystal Henderson ?07/24/2021, 6:04 AM ? ? ?Recent Labs  ?Lab 07/22/21 ?1735 07/23/21 ?0615 07/24/21 ?01798 ?HGB  --  8.0* 8.0*  ?ALBUMIN  --  2.9* 3.0*  ?CALCIUM 9.3 8.5* 9.1  ?PHOS 5.9*  --  6.7*  ?CREATININE 3.54* 3.25* 3.03*  ?K 4.8 5.1 4.8  ? ?Inpatient medications: ? vitamin C  1,000 mg Oral Daily  ? Chlorhexidine Gluconate Cloth  6 each Topical Daily  ? cholecalciferol  2,000 Units Oral Daily  ? diltiazem  30 mg Oral Q12H  ? docusate sodium  100 mg Oral BID  ? furosemide  80 mg Intravenous Q12H  ? pantoprazole  40 mg Oral BID  ? pregabalin  100 mg Oral QHS  ? senna  1 tablet Oral BID  ? sodium chloride flush  3 mL Intravenous Q12H  ? ? sodium chloride    ? cefTRIAXone (ROCEPHIN)  IV 2 g (07/23/21 1844)  ? doxycycline (VIBRAMYCIN) IV 100 mg (07/24/21 0426)  ? ?sodium chloride, acetaminophen **OR** acetaminophen, ALPRAZolam, HYDROcodone-acetaminophen, ondansetron **OR** ondansetron (ZOFRAN) IV, polyvinyl alcohol, sodium chloride flush ? ? ? ? ? ? ?

## 2021-07-25 DIAGNOSIS — I509 Heart failure, unspecified: Secondary | ICD-10-CM | POA: Diagnosis not present

## 2021-07-25 LAB — RENAL FUNCTION PANEL
Albumin: 3.3 g/dL — ABNORMAL LOW (ref 3.5–5.0)
Anion gap: 11 (ref 5–15)
BUN: 43 mg/dL — ABNORMAL HIGH (ref 8–23)
CO2: 28 mmol/L (ref 22–32)
Calcium: 9.1 mg/dL (ref 8.9–10.3)
Chloride: 97 mmol/L — ABNORMAL LOW (ref 98–111)
Creatinine, Ser: 3.19 mg/dL — ABNORMAL HIGH (ref 0.44–1.00)
GFR, Estimated: 14 mL/min — ABNORMAL LOW (ref >60)
Glucose, Bld: 80 mg/dL (ref 70–99)
Phosphorus: 6 mg/dL — ABNORMAL HIGH (ref 2.5–4.6)
Potassium: 4.3 mmol/L (ref 3.5–5.1)
Sodium: 136 mmol/L (ref 135–145)

## 2021-07-25 MED ORDER — SORBITOL 70 % SOLN
30.0000 mL | Freq: Every day | Status: DC | PRN
  Administered 2021-07-25 – 2021-07-31 (×3): 30 mL via ORAL
  Filled 2021-07-25 (×3): qty 30

## 2021-07-25 MED ORDER — PREGABALIN 75 MG PO CAPS
75.0000 mg | ORAL_CAPSULE | Freq: Every day | ORAL | Status: DC
  Administered 2021-07-25 – 2021-07-31 (×7): 75 mg via ORAL
  Filled 2021-07-25 (×7): qty 1

## 2021-07-25 MED ORDER — FUROSEMIDE 10 MG/ML IJ SOLN
80.0000 mg | Freq: Three times a day (TID) | INTRAMUSCULAR | Status: DC
  Administered 2021-07-25 – 2021-07-27 (×6): 80 mg via INTRAVENOUS
  Filled 2021-07-25 (×6): qty 8

## 2021-07-25 NOTE — Progress Notes (Addendum)
[facility] ?Progress Note ? ?Subjective: pt seen in room. 3.5 L out yesterday, 2.4 liters so far today. BP's stable 120 /60.  ? ?Vitals:  ? 07/24/21 1621 07/24/21 1930 07/24/21 2110 07/25/21 0410  ?BP: 109/62 (!) 97/50 103/62 122/61  ?Pulse: 68 65 69 74  ?Resp: _0 ?Temp:  (!) 97.5 ?F (36.4 ?C)  97.8 ?F (36.6 ?C)  ?TempSrc:  Oral  Oral  ?SpO2: 94% 94%  93%  ?Weight:    81.6 kg  ?Height:      ? ? ?Exam: ?Gen alert, quite frail, tremors, no distress ?+jvd or bruits ?Chest rales R base, dec'd BS L base ?RRR no RG ?Abd soft ntnd no mass or ascites +bs ?Ext 2-3+ edema ankles to hips bilat ?Neuro Ox 3, significant gen weakness, nf ?  ? Home meds include - xanax, norvasc, cardizem 120 , eliquis, lasix 20 qd, losartan 100 qd, lyrica 100 hs, prns/ vits/ supps ?  ? ?      Date                      Creat               eGFR ?    2019                        2.0                                                        ?    2020                        1.90- 1.98                                     ?    2021                        1.64- 2.21                                             ?   1st half 2022            2.12                 21 ml/min                                  ?   2nd half 2022           2.50                 17 ml/min ?    06/22/21                    2.53                 17 ml/min ?    4/19  3.52                                                      ?    07/23/21                    3.25                              ?  ?     BP's 90- 110 sbp / 50-70 dbp, HR 72 RR 16  temp 98 ?    Na 136  K 5.1  CO2 23  BUN 42  Creat 3.25  alb 2.9  wBC 5K  hb 8 ?      CXR 4/19 IMPRESSION: Interstitial prominence and indistinctness. Lungs are low in volume. Probable bilateral pleural effusions. Left lower lobe consolidation. Biapical pleural thickening.  ?     Renal US - 7.2/ 9.3 cm kidneys , no hydro, Edison Pace ?     UA 4/20 - negative ?     UNa 108,  UCr 68 ? ? ?  ?Assessment/ Plan: ?AKI on CKD IV  - b/l creatinine 2.1- 2.5 from 2022, egfr 17 - 74m/min.  Creat 3.5 on admit here in setting of marked volume overload, pulm edema, possible pna. AKI likely due to decompensated CHF.  Diuresing well now w/ lasix IV 100 tid and add zaroxolyn 5 qd, maybe a little too good. Will lower lasix 80 tid. Renal function stable today. Cont diuresis.  ?GOC - pt does not want to do dialysis, and she is a poor HD candidate regardless due to advanced age, debility and comorbidities.  Will need palliative input if she declines.  ?Acute CHF - w/ peripheral and pulm edema ?CAP - started IV abx ?Gen'd weakness - multifactorial ?Atrial fib - on eliquis and diltiazem ?Chronic back pain ?DNR ?  ?  ?  ?  ? ? ? ?Rob SDoctor, hospital?07/25/2021, 7:50 AM ? ? ?Recent Labs  ?Lab 07/23/21 ?0615 07/24/21 ?0831504/22/23 ?0341  ?HGB 8.0* 8.0*  --   ?ALBUMIN 2.9* 3.0* 3.3*  ?CALCIUM 8.5* 9.1 9.1  ?PHOS  --  6.7* 6.0*  ?CREATININE 3.25* 3.03* 3.19*  ?K 5.1 4.8 4.3  ? ? ?Inpatient medications: ? apixaban  2.5 mg Oral BID  ? vitamin C  1,000 mg Oral Daily  ? Chlorhexidine Gluconate Cloth  6 each Topical Daily  ? cholecalciferol  2,000 Units Oral Daily  ? diltiazem  30 mg Oral Q12H  ? docusate sodium  100 mg Oral BID  ? metolazone  5 mg Oral Daily  ? pantoprazole  40 mg Oral BID  ? pregabalin  100 mg Oral QHS  ? senna  1 tablet Oral BID  ? sodium chloride flush  3 mL Intravenous Q12H  ? ? sodium chloride    ? cefTRIAXone (ROCEPHIN)  IV 2 g (07/24/21 1805)  ? doxycycline (VIBRAMYCIN) IV 100 mg (07/25/21 0526)  ? furosemide 100 mg (07/25/21 0500)  ? ?sodium chloride, acetaminophen **OR** acetaminophen, ALPRAZolam, HYDROcodone-acetaminophen, ondansetron **OR** ondansetron (ZOFRAN) IV, polyvinyl alcohol, sodium chloride flush ? ? ? ? ? ? ?

## 2021-07-25 NOTE — Progress Notes (Signed)
?Progress Note ? ? ?Patient: Krystal Henderson HAL:[id number] DOB: Apr 15, 1932 DOA: 07/22/2021     2 ?DOS: the patient was seen and examined on 07/25/2021 ?  ?Brief hospital course: ?88 year old with past medical history significant for diastolic and systolic heart failure ejection fraction 45 to 50%, hypertension, chronic low back pain, compression fracture, B12 deficiency GERD who presents with generalized weakness, inability to ambulate.  She also complains of dry cough, shortness of breath, lower extremity edema. ? ?Patient admitted with acute on chronic heart failure exacerbation and AKI on CKD stage IV. ? ?Assessment and Plan: ?* Acute exacerbation of CHF (congestive heart failure) ([place]) ?Patient presents with cough, SOB, elevation BNP, weakness, LE edema. Chest x ray with HF finding.  ?ECHO; Ef 55 % ?Weight on admission 160 pounds--184--180 ?Cardiology consulted and sign off.  ?Good urine out put yesterday.  ?Diuretics change to Lasix 80 mg TID ?Continue with Metolazone.  ? ?Leg wound, right ?She has Right LE wound after excision of basal cell cancer 2 weeks ago.  ?She has some redness. Her dermatologist prescribe Doxycycline for 7 days to start on 4/19. ?Continue with antibiotics.  ?Hypothermia last night. blood culture no growth to date.  ? ?Headache ?CT head negative for acute finding.  ?Her pain is more scalp pain, irritation.  ?Needs to follow up with dermatologist  ? ?CKD (chronic kidney disease) stage 4, GFR 15-29 ml/min ([place]) ?AKI on CKD stage IV, concern for cardiorenal syndrome.  ?Cr per records 2.5---2.1 ?Cr on admission 3.5--3.0--3.1 ?Nephrology consulted to assist with diuresis.  ?Renal US no hydronephrosis.  ?Good diuresis yesterday.  ?continue with lasix and metolazone.  ? ?Generalized weakness ?Suspect deconditioning, als related to HF, LE edema.  ?PT /OT consult.  ?BL weakness improving.  ? ?Paroxysmal atrial fibrillation ([place]) ?On diltiazem dose reduce.  ?GI, recommend to resume eliquis and  monitor.  ? ?CAP (community acquired pneumonia) ?She report cough. Chest x ray Left Lower lobe atelectasis vs consolidations.  ?Pro-calcitonin not significantly elevated but will Continue  ceftriaxone and Doxy to cover for PNA and LE cellulitis. Plan for 7 days antibiotics.  ? ?Normocytic anemia ?Hb trending down, hb one moth ago at 10.  ?Hb on admission 8.9---8.0. ?Iron: 82 T sat 10. Ferritin 21/ will discussed with nephrology in regards aranesp/Iron. ?GI consulted, and recommend to monitor patient on eliquis.  ? ?Peripheral neuropathy ?Continue with lyrica.  ? ?Vitamin B12 deficiency ?On supplement.  ? ?DJD (degenerative joint disease) ?Vicodin  for chronic back pain and headaches.  ? ?GERD (gastroesophageal reflux disease) ?Continue with PPI.  ? ?HTN (hypertension) ?BP soft, hold amlodipine and Cozaar.  ? ? ? ? ?  ? ?Subjective: she denies dyspnea. Denies pain. Feels better ? ?Physical Exam: ?Vitals:  ? 07/24/21 1621 07/24/21 1930 07/24/21 2110 07/25/21 0410  ?BP: 109/62 (!) 97/50 103/62 122/61  ?Pulse: 68 65 69 74  ?Resp: '16 16  16  '$ ?Temp:  (!) 97.5 ?F (36.4 ?C)  97.8 ?F (36.6 ?C)  ?TempSrc:  Oral  Oral  ?SpO2: 94% 94%  93%  ?Weight:    81.6 kg  ?Height:      ? ?General; NAD ?Lung; CTA ?LE ; ;ess edema ? ?Data Reviewed: ? ?Bmet, cbc ? ?Family Communication: Husband at bedside 4/21. ? ?Disposition: ?Status is: Inpatient ?Remains inpatient appropriate because: management of renal failure ? Planned Discharge Destination: Skilled nursing facility ? ? ? ?Time spent: 35 minutes ? ?Author: ?Elmarie Shiley, MD ?07/25/2021 12:07 PM ? ?For on call review  [email or web address]  ?

## 2021-07-26 ENCOUNTER — Inpatient Hospital Stay (HOSPITAL_COMMUNITY): Payer: Medicare Other

## 2021-07-26 DIAGNOSIS — I509 Heart failure, unspecified: Secondary | ICD-10-CM | POA: Diagnosis not present

## 2021-07-26 LAB — RENAL FUNCTION PANEL
Albumin: 3.3 g/dL — ABNORMAL LOW (ref 3.5–5.0)
Anion gap: 13 (ref 5–15)
BUN: 52 mg/dL — ABNORMAL HIGH (ref 8–23)
CO2: 32 mmol/L (ref 22–32)
Calcium: 9.6 mg/dL (ref 8.9–10.3)
Chloride: 93 mmol/L — ABNORMAL LOW (ref 98–111)
Creatinine, Ser: 3.29 mg/dL — ABNORMAL HIGH (ref 0.44–1.00)
GFR, Estimated: 13 mL/min — ABNORMAL LOW (ref >60)
Glucose, Bld: 74 mg/dL (ref 70–99)
Phosphorus: 6.4 mg/dL — ABNORMAL HIGH (ref 2.5–4.6)
Potassium: 3.9 mmol/L (ref 3.5–5.1)
Sodium: 138 mmol/L (ref 135–145)

## 2021-07-26 LAB — CBC
HCT: 28.7 % — ABNORMAL LOW (ref 36.0–46.0)
Hemoglobin: 9.1 g/dL — ABNORMAL LOW (ref 12.0–15.0)
MCH: 27.7 pg (ref 26.0–34.0)
MCHC: 31.7 g/dL (ref 30.0–36.0)
MCV: 87.5 fL (ref 80.0–100.0)
Platelets: 305 10*3/uL (ref 150–400)
RBC: 3.28 MIL/uL — ABNORMAL LOW (ref 3.87–5.11)
RDW: 17.7 % — ABNORMAL HIGH (ref 11.5–15.5)
WBC: 6.3 10*3/uL (ref 4.0–10.5)
nRBC: 0 % (ref 0.0–0.2)

## 2021-07-26 MED ORDER — POLYETHYLENE GLYCOL 3350 17 G PO PACK
17.0000 g | PACK | Freq: Every day | ORAL | Status: DC
  Administered 2021-07-26 – 2021-07-29 (×4): 17 g via ORAL
  Filled 2021-07-26 (×5): qty 1

## 2021-07-26 NOTE — TOC Initial Note (Signed)
Transition of Care (TOC) - Initial/Assessment Note  ? ? ?Patient Details  ?Name: Krystal Henderson ?MRN: [id number] ?Date of Birth: May 02, 1932 ? ?Transition of Care (TOC) CM/SW Contact:    ?Tawanna Cooler, RN ?Phone Number: ?07/26/2021, 2:09 PM ? ?Clinical Narrative:                 ? ?Called patient's room and spoke with her husband Fritz Pickerel.  He states patient has been to SNF for rehab many years ago after a back surgery. They are agreeable to go again, and first choice is [facility].  ?FL2 sent.  Pending bed offers. ? ? ?Expected Discharge Plan: [place] ?Barriers to Discharge: Continued Medical Work up ? ?  ?Choice offered to / list presented to : Spouse ? ?Expected Discharge Plan and Services ?Expected Discharge Plan: [place] ?  ?  ?Living arrangements for the past 2 months: [place] ?                ?  ?Prior Living Arrangements/Services ?Living arrangements for the past 2 months: [place] ?Lives with:: Spouse ?Patient language and need for interpreter reviewed:: Yes ?       ?Need for Family Participation in Patient Care: Yes (Comment) ?Care giver support system in place?: Yes (comment) ?  ?Criminal Activity/Legal Involvement Pertinent to Current Situation/Hospitalization: No - Comment as needed ? ?Activities of Daily Living ?Home Assistive Devices/Equipment: Chana Bode (specify type), Dentures (specify type), Shower chair with back, Hand-held shower hose ?ADL Screening (condition at time of admission) ?Patient's cognitive ability adequate to safely complete daily activities?: Yes ?Is the patient deaf or have difficulty hearing?: No ?Does the patient have difficulty seeing, even when wearing glasses/contacts?: No ?Does the patient have difficulty concentrating, remembering, or making decisions?: No ?Patient able to express need for assistance with ADLs?: Yes ?Does the patient have difficulty dressing or bathing?: Yes ?Independently performs ADLs?:  No ?Communication: Independent ?Dressing (OT): Needs assistance ?Is this a change from baseline?: Change from baseline, expected to last <3days ?Grooming: Needs assistance ?Is this a change from baseline?: Change from baseline, expected to last >3 days ?Feeding: Needs assistance ?Is this a change from baseline?: Change from baseline, expected to last >3 days ?Bathing: Needs assistance ?Is this a change from baseline?: Change from baseline, expected to last >3 days ?Toileting: Needs assistance ?Is this a change from baseline?: Change from baseline, expected to last >3days ?In/Out Bed: Needs assistance ?Is this a change from baseline?: Change from baseline, expected to last >3 days ?Walks in Home: Independent with device (comment) ?Does the patient have difficulty walking or climbing stairs?: Yes ?Weakness of Legs: Both ?Weakness of Arms/Hands: Both ? ?Emotional Assessment ?  ?Alcohol / Substance Use: Not Applicable ?Psych Involvement: No (comment) ? ?Admission diagnosis:  Weakness [R53.1] ?Acute exacerbation of CHF (congestive heart failure) ([place]) [I50.9] ?CHF (congestive heart failure) ([place]) [I50.9] ?Patient Active Problem List  ? Diagnosis Date Noted  ? CHF (congestive heart failure) ([place]) 07/24/2021  ? Leg wound, right 07/23/2021  ? Acute exacerbation of CHF (congestive heart failure) ([place]) 07/22/2021  ? Generalized weakness   ? CKD (chronic kidney disease) stage 4, GFR 15-29 ml/min (H[facility])   ? Headache   ? Venous stasis dermatitis of right lower extremity 08/07/2020  ? Insomnia 08/07/2020  ? Acute on chronic combined systolic and diastolic CHF (congestive heart failure) ([place]) 11/12/2019  ? Paroxysmal atrial fibrillation ([place]) 11/10/2019  ? Impaired mobility and ADLs 11/10/2019  ?  Acute pulmonary embolism ([place]) 11/08/2019  ? Impacted cerumen of left ear 01/03/2019  ? Lesion of skin of scalp 12/30/2018  ? Edema 06/22/2018  ? Iron deficiency anemia 04/02/2018  ? Lumbar post-laminectomy syndrome 07/15/2017  ? Lumbar  compression fracture ([place]) 11/26/2015  ? Lower extremity edema 10/23/2015  ? Chronic low back pain 06/21/2014  ? Pain in joint, shoulder region 02/11/2014  ? Vitamin D deficiency 03/30/2013  ? Acute kidney injury ([place]) 01/01/2012  ? Spinal stenosis 12/23/2011  ? Osteopenia 12/23/2011  ? Renal insufficiency, mild 08/24/2011  ? CAP (community acquired pneumonia) 05/08/2011  ? Vitamin B12 deficiency 09/24/2010  ? IBS (irritable bowel syndrome) 09/24/2010  ? Peripheral neuropathy 09/24/2010  ? Normocytic anemia 09/24/2010  ? GERD (gastroesophageal reflux disease) 08/12/2009  ? DIVERTICULOSIS, COLON 09/08/2007  ? Angiodysplasia of intestine with hemorrhage 09/08/2007  ? DJD (degenerative joint disease) 09/08/2007  ? COLONIC POLYPS, ADENOMATOUS, HX OF 09/08/2007  ? HYPERLIPIDEMIA 01/23/2007  ? HTN (hypertension) 01/23/2007  ? Osteoporosis 01/23/2007  ? HYPOGLYCEMIA, REACTIVE 06/30/2006  ? ?PCP:  Ann Held, DO ?Pharmacy:   ?[facility] [place], [place] [address] AT [facility] ?[place] [address] [place] [address] ?Phone: [phone] Fax: [phone] ? ?[facility] [place], [place] [address] AT [facility] ?[place] [address] ?Phone: [phone] Fax: [phone] ? ? ?Readmission Risk Interventions ? ?  11/12/2019  ?  2:07 PM  ?Readmission Risk Prevention Plan  ?Transportation Screening Complete  ?Home Care Screening Complete  ?Medication Review (RN CM) Complete  ? ? ? ?

## 2021-07-26 NOTE — Progress Notes (Signed)
?Progress Note ? ? ?Patient: Krystal Henderson HDQ:[id number] DOB: 03/07/1933 DOA: 07/22/2021     3 ?DOS: the patient was seen and examined on 07/26/2021 ?  ?Brief hospital course: ?88 year old with past medical history significant for diastolic and systolic heart failure ejection fraction 45 to 50%, hypertension, chronic low back pain, compression fracture, B12 deficiency GERD who presents with generalized weakness, inability to ambulate.  She also complains of dry cough, shortness of breath, lower extremity edema. ? ?Patient admitted with acute on chronic heart failure exacerbation and AKI on CKD stage IV. ? ?Assessment and Plan: ?* Acute exacerbation of CHF (congestive heart failure) ([place]) ?Patient presents with cough, SOB, elevation BNP, weakness, LE edema. Chest x ray with HF finding.  ?ECHO; Ef 55 % ?Weight on admission 160 pounds--184--180 ?Cardiology consulted and sign off.  ?Good urine out put. ?On  Lasix 80 mg TID ?Continue with Metolazone.  ?Defer to nephrologist transition to oral diuretics.  ? ?Leg wound, right ?She has Right LE wound after excision of basal cell cancer 2 weeks ago.  ?She has some redness. Her dermatologist prescribe Doxycycline for 7 days to start on 4/19. ?Continue with antibiotics.  ?Hypothermia last night. blood culture no growth to date.  ? ?Headache ?CT head negative for acute finding.  ?Her pain is more scalp pain, irritation.  ?Needs to follow up with dermatologist  ? ?CKD (chronic kidney disease) stage 4, GFR 15-29 ml/min ([place]) ?AKI on CKD stage IV, concern for cardiorenal syndrome.  ?Cr per records 2.5---2.1 ?Cr on admission 3.5--3.0--3.1-3.2 ?Nephrology consulted to assist with diuresis.  ?Renal US no hydronephrosis.  ?Good diuresis yesterday.  ?continue with lasix and metolazone.  ? ?Generalized weakness ?Suspect deconditioning, als related to HF, LE edema.  ?PT /OT consult.  ?BL weakness improving.  ? ?Paroxysmal atrial fibrillation ([place]) ?On diltiazem dose reduce.  ?GI,  recommend to resume eliquis and monitor.  ? ?CAP (community acquired pneumonia) ?She report cough. Chest x ray Left Lower lobe atelectasis vs consolidations.  ?Pro-calcitonin not significantly elevated but will Continue  ceftriaxone and Doxy to cover for PNA and LE cellulitis. Plan for 7 days antibiotics.  ? ?Normocytic anemia ?Hb trending down, hb one moth ago at 10.  ?Hb on admission 8.9---8.0.--9 ?Iron: 59 T sat 10. Ferritin 21/ will discussed with nephrology in regards aranesp/Iron. ?GI consulted, and recommend to monitor patient on eliquis.  ?Hb stable ? ?Peripheral neuropathy ?Continue with lyrica.  ? ?Vitamin B12 deficiency ?On supplement.  ? ?DJD (degenerative joint disease) ?Vicodin  for chronic back pain and headaches.  ? ?GERD (gastroesophageal reflux disease) ?Continue with PPI.  ? ?HTN (hypertension) ?BP soft, hold amlodipine and Cozaar.  ? ? ? ? ?  ? ?Subjective: she report breathing better, she has been able to get out of bed with two assist ? ? ?Physical Exam: ?Vitals:  ? 07/26/21 0531 07/26/21 0930 07/26/21 1015 07/26/21 1018  ?BP: (!) 104/48     ?Pulse: (!) 52     ?Resp: 19     ?Temp: 98.4 ?F (36.9 ?C)     ?TempSrc: Oral     ?SpO2: 98% 99% (!) 85% 93%  ?Weight:      ?Height:      ? ?General NAD ?Lung; CTA ?Abdomen; soft, nt ? ?Data Reviewed: ? ?Cbc and Bmet ? ?Family Communication: Husband 4/22 ? ?Disposition: ?Status is: Inpatient ?Remains inpatient appropriate because: management AKI ? Planned Discharge Destination: Skilled nursing facility ? ? ? ?Time spent: 35 minutes ? ?Author: Cassie Freer  Wladyslawa Disbro, MD ?07/26/2021 11:30 AM ? ?For on call review [email or web address].  ?

## 2021-07-26 NOTE — Plan of Care (Signed)
?  Problem: Safety: ?Goal: Ability to remain free from injury will improve ?Outcome: Progressing ?  ?Problem: Respiratory: ?Goal: Ability to maintain adequate ventilation will improve ?Outcome: Progressing ?Goal: Ability to maintain a clear airway will improve ?Outcome: Progressing ?  ?

## 2021-07-26 NOTE — Plan of Care (Signed)
?  Problem: Education: ?Goal: Ability to demonstrate management of disease process will improve ?Outcome: Progressing ?Goal: Ability to verbalize understanding of medication therapies will improve ?Outcome: Progressing ?Goal: Individualized Educational Video(s) ?Outcome: Progressing ?  ?Problem: Activity: ?Goal: Capacity to carry out activities will improve ?Outcome: Progressing ?  ?Problem: Cardiac: ?Goal: Ability to achieve and maintain adequate cardiopulmonary perfusion will improve ?Outcome: Progressing ?  ?Problem: Education: ?Goal: Knowledge of General Education information will improve ?Description: Including pain rating scale, medication(s)/side effects and non-pharmacologic comfort measures ?Outcome: Progressing ?  ?Problem: Activity: ?Goal: Risk for activity intolerance will decrease ?Outcome: Progressing ?  ?Problem: Safety: ?Goal: Ability to remain free from injury will improve ?Outcome: Progressing ?  ?Problem: Skin Integrity: ?Goal: Risk for impaired skin integrity will decrease ?Outcome: Progressing ?  ?Problem: Respiratory: ?Goal: Ability to maintain adequate ventilation will improve ?Outcome: Progressing ?Goal: Ability to maintain a clear airway will improve ?Outcome: Progressing ?  ?

## 2021-07-26 NOTE — Progress Notes (Signed)
[facility] ?Progress Note ? ?Subjective: pt seen in room. 7 L UOP yest w/ net I/O total since admission of -8.3 L. Creat slightly up at 3.29 today.  ? ?Vitals:  ? 07/25/21 1900 07/25/21 2025 07/26/21 0500 07/26/21 0531  ?BP: 110/73 (!) 119/53  (!) 104/48  ?Pulse: 67 72  (!) 52  ?Resp:  19  19  ?Temp: 98 ?F (36.7 ?C) 98 ?F (36.7 ?C)  98.4 ?F (36.9 ?C)  ?TempSrc: Oral Oral  Oral  ?SpO2: 97% 100%  98%  ?Weight:   81.9 kg   ?Height:      ? ? ?Exam: ?Gen alert, quite frail, tremors, no distress ?+jvd or bruits ?Chest rales R base, dec'd BS L base ?RRR no RG ?Abd soft ntnd no mass or ascites +bs ?Ext 2+ edema ankles to hips bilat ?Neuro Ox 3, significant gen weakness, nf ?  ? Home meds include - xanax, norvasc, cardizem 120 , eliquis, lasix 20 qd, losartan 100 qd, lyrica 100 hs, prns/ vits/ supps ?  ? ?      Date                      Creat               eGFR ?    2019                        2.0                                                        ?    2020                        1.90- 1.98                                     ?    2021                        1.64- 2.21                                             ?   1st half 2022            2.12                 21 ml/min                                  ?   2nd half 2022           2.50                 17 ml/min ?    06/22/21                    2.53                 17 ml/min ?    4/19  3.52                                                      ?    07/23/21                    3.25                              ?  ?     BP's 90- 110 sbp / 50-70 dbp, HR 72 RR 16  temp 98 ?    Na 136  K 5.1  CO2 23  BUN 42  Creat 3.25  alb 2.9  wBC 5K  hb 8 ?      CXR 4/19 IMPRESSION: Interstitial prominence and indistinctness. Lungs are low in volume. Probable bilateral pleural effusions. Left lower lobe consolidation. Biapical pleural thickening.  ?     Renal US - 7.2/ 9.3 cm kidneys , no hydro, Edison Pace ?     UA 4/20 - negative ?     UNa 108,  UCr 68 ? ? ?   ?Assessment/ Plan: ?AKI on CKD IV - b/l creatinine 2.1- 2.5 from 2022, egfr 17 - 40m/min.  Creat 3.5 on admit here in setting of marked volume overload, pulm edema, possible pna. AKI likely due to decompensated CHF.  Diuresing too well on IV lasix 80 tid and po metolazone. Will dc metolazone. Creat up slightly. Still quiet edematous. Would keep foley in until she is more mobile and/or diuresis is winding down.  ?Vol overload - decomp CHF, diuresing well. Repeat CXR today shows improved pulm edema.  ?HTN - on norvasc and diltiazem at home. BP's soft here, norvasc on hold and dilt lowered to low dose 30 bid for rate control.  ?Atrial fib - on eliquis and diltiazem ?GOC - pt made DNR here. Pt does not want to do dialysis, and she is a poor HD candidate regardless due to advanced age, debility and comorbidities.  Will need palliative input if she declines.  ?CAP - started IV abx ?Gen'd weakness - multifactorial ?Chronic back pain ? ?Rob Wheeler [facility] ?07/26/2021, 8:02 AM ? ? ?Recent Labs  ?Lab 07/24/21 ?[id number]/22/23 ?0341 07/26/21 ?0333  ?HGB 8.0*  --  9.1*  ?ALBUMIN 3.0* 3.3* 3.3*  ?CALCIUM 9.1 9.1 9.6  ?PHOS 6.7* 6.0* 6.4*  ?CREATININE 3.03* 3.19* 3.29*  ?K 4.8 4.3 3.9  ? ? ?Inpatient medications: ? apixaban  2.5 mg Oral BID  ? vitamin C  1,000 mg Oral Daily  ? Chlorhexidine Gluconate Cloth  6 each Topical Daily  ? cholecalciferol  2,000 Units Oral Daily  ? diltiazem  30 mg Oral Q12H  ? docusate sodium  100 mg Oral BID  ? furosemide  80 mg Intravenous Q8H  ? metolazone  5 mg Oral Daily  ? pantoprazole  40 mg Oral BID  ? pregabalin  75 mg Oral QHS  ? senna  1 tablet Oral BID  ? sodium chloride flush  3 mL Intravenous Q12H  ? ? sodium chloride    ? cefTRIAXone (ROCEPHIN)  IV 2 g (07/25/21 1706)  ? doxycycline (VIBRAMYCIN) IV 100 mg (07/26/21 0603)  ? ?sodium chloride, acetaminophen **OR** acetaminophen, ALPRAZolam, HYDROcodone-acetaminophen, ondansetron **OR** ondansetron (ZOFRAN) IV, polyvinyl alcohol, sodium chloride  flush,  sorbitol ? ? ? ? ? ? ?

## 2021-07-26 NOTE — Progress Notes (Signed)
Patient up to [facility], this RN administered tap water enema, large amount of soft brown stool returned.  Patient voices relief.  ?

## 2021-07-26 NOTE — Progress Notes (Signed)
Occupational Therapy Treatment ?Patient Details ?Name: Krystal Henderson ?MRN: [id number] ?DOB: Oct 13, 1932 ?Today's Date: 07/26/2021 ? ? ?History of present illness 88 y.o. female with medical history significant of  Diastolic, Systolic HF Ef 63-14 %, hypertension, chronic low back pain, compression fracture, GERD, B12 deficiency, who presents with generalized weakness, inability to stand up from her recliner. Dx of CHF, CAP ?  ?OT comments ? Treatment focused on functional mobility and standing tolerance needed to advance ADLs to out of bed. Patient mod assist to transfer to edge of bed and min assist to stand with walker. Min guard to take steps to recliner and march in place. Patient reported her legs felt to weak to stand at sink as therapist had planned. Will hope to advance to standing grooming task on next treatment.  ? ?Recommendations for follow up therapy are one component of a multi-disciplinary discharge planning process, led by the attending physician.  Recommendations may be updated based on patient status, additional functional criteria and insurance authorization. ?   ?Follow Up Recommendations ? Skilled nursing-short term rehab (<3 hours/day)  ?  ?Assistance Recommended at Discharge Frequent or constant Supervision/Assistance  ?Patient can return home with the following ? A lot of help with walking and/or transfers;A lot of help with bathing/dressing/bathroom;Assistance with cooking/housework;Help with stairs or ramp for entrance;Assist for transportation ?  ?Equipment Recommendations ? Other (comment) (Defer to next venue)  ?  ?Recommendations for Other Services   ? ?  ?Precautions / Restrictions Precautions ?Precautions: Fall ?Precaution Comments: Monitor BP-orthostatic; spouse reports 1 fall when getting out of bathtub 3 months ago (no other falls in 6 months) ?Restrictions ?Weight Bearing Restrictions: No  ? ? ?  ? ?Mobility Bed Mobility ?Overal bed mobility: Needs Assistance ?  ?  ?  ?  ?Sit to  supine: Mod assist, HOB elevated ?  ?General bed mobility comments: Mod assist for assist with trunk and pivoting hips. Patient assisting wtih use of bed rail. ?  ? ?Transfers ?Overall transfer level: Needs assistance ?Equipment used: Rolling walker (2 wheels) ?Transfers: Sit to/from Stand ?Sit to Stand: Min assist, From elevated surface ?  ?  ?Step pivot transfers: Min guard ?  ?  ?General transfer comment: Min assist to power up but min guard to take steps to recliner. Patient reports her legs feel like they want to give way so aborted attempt to walk to sink. Able to perform marching in place before sitting down. ?  ?  ?Balance Overall balance assessment: Needs assistance ?Sitting-balance support: No upper extremity supported, Feet supported ?Sitting balance-Leahy Scale: Fair ?  ?  ?Standing balance support: During functional activity, Reliant on assistive device for balance ?Standing balance-Leahy Scale: Poor ?  ?  ?  ?  ?  ?  ?  ?  ?  ?  ?  ?  ?   ? ?ADL either performed or assessed with clinical judgement  ? ?ADL   ?  ?  ?  ?  ?  ?  ?  ?  ?  ?  ?  ?  ?  ?  ?  ?  ?  ?  ?  ?  ?  ? ?Extremity/Trunk Assessment   ?  ?  ?  ?  ?  ? ?Vision Patient Visual Report: No change from baseline ?  ?  ?Perception   ?  ?Praxis   ?  ? ?Cognition Arousal/Alertness: Awake/alert ?Behavior During Therapy: [facility] for tasks assessed/performed ?Overall Cognitive Status: Within Functional Limits  for tasks assessed ?  ?  ?  ?  ?  ?  ?  ?  ?  ?  ?  ?  ?  ?  ?  ?  ?General Comments: A little flat initially but brightened up when I got her to the chair and started talking about classic movies ?  ?  ?   ?Exercises Other Exercises ?Other Exercises: Educated patient to perform ankle pumps and partial hip hikes to work on strengthening LEs ? ?  ?Shoulder Instructions   ? ? ?  ?General Comments    ? ? ?Pertinent Vitals/ Pain       Pain Assessment ?Pain Assessment: No/denies pain ? ?Home Living   ?  ?  ?  ?  ?  ?  ?  ?  ?  ?  ?  ?  ?  ?  ?  ?  ?  ?   ? ?  ?Prior Functioning/Environment    ?  ?  ?  ?   ? ?Frequency ? Min 2X/week  ? ? ? ? ?  ?Progress Toward Goals ? ?OT Goals(current goals can now be found in the care plan section) ? Progress towards OT goals: Progressing toward goals ? ?Acute Rehab OT Goals ?Patient Stated Goal: stand at sink ?OT Goal Formulation: With patient ?Time For Goal Achievement: 08/06/21 ?Potential to Achieve Goals: Good  ?Plan Discharge plan remains appropriate   ? ?Co-evaluation ? ? ?   ?  ?  ?  ?  ? ?  ?AM-PAC OT "6 Clicks" Daily Activity     ?Outcome Measure ? ? Help from another person eating meals?: A Little ?Help from another person taking care of personal grooming?: A Little ?Help from another person toileting, which includes using toliet, bedpan, or urinal?: A Lot ?Help from another person bathing (including washing, rinsing, drying)?: A Lot ?Help from another person to put on and taking off regular upper body clothing?: A Little ?Help from another person to put on and taking off regular lower body clothing?: Total ?6 Click Score: 14 ? ?  ?End of Session Equipment Utilized During Treatment: Rolling walker (2 wheels);Oxygen ? ?OT Visit Diagnosis: Unsteadiness on feet (R26.81);Other abnormalities of gait and mobility (R26.89);Muscle weakness (generalized) (M62.81) ?  ?Activity Tolerance Patient tolerated treatment well ?  ?Patient Left in chair;with call bell/phone within reach;with family/visitor present ?  ?Nurse Communication Mobility status ?  ? ?   ? ?Time: 5456-2563 ?OT Time Calculation (min): 18 min ? ?Charges: OT General Charges ?$OT Visit: 1 Visit ?OT Treatments ?$Therapeutic Activity: 8-22 mins ? ?Jeannia Tatro, OTR/L ?Acute Care Rehab Services  ?Office [phone] ?Pager: [phone]  ? ?Kolette Vey L Brighten Orndoff ?07/26/2021, 1:17 PM ?

## 2021-07-26 NOTE — NC FL2 (Signed)
?[place] MEDICAID FL2 LEVEL OF CARE SCREENING TOOL  ?  ? ?IDENTIFICATION  ?Patient Name: ?Krystal Henderson Birthdate: Mar 28, 1933 Sex: female Admission Date (Current Location): ?07/22/2021  ?[place] and [place] Number: ? [place] ?  Facility and Address:  ?[facility],  [place], [place] ?     Provider Number: ?[id number]  ?Attending Physician Name and Address:  ?Elmarie Shiley, MD ? Relative Name and Phone Number:  ?Adryanna Friedt (Spouse)   [phone] ?   ?Current Level of Care: ?Hospital Recommended Level of Care: ?[place] Prior Approval Number: ?  ? ?Date Approved/Denied: ?01/03/12 PASRR Number: ?[id number] A ? ?Discharge Plan: ?SNF ?  ? ?Current Diagnoses: ?Patient Active Problem List  ? Diagnosis Date Noted  ? CHF (congestive heart failure) ([place]) 07/24/2021  ? Leg wound, right 07/23/2021  ? Acute exacerbation of CHF (congestive heart failure) ([place]) 07/22/2021  ? Generalized weakness   ? CKD (chronic kidney disease) stage 4, GFR 15-29 ml/min (HCC)   ? Headache   ? Venous stasis dermatitis of right lower extremity 08/07/2020  ? Insomnia 08/07/2020  ? Acute on chronic combined systolic and diastolic CHF (congestive heart failure) ([place]) 11/12/2019  ? Paroxysmal atrial fibrillation ([place]) 11/10/2019  ? Impaired mobility and ADLs 11/10/2019  ? Acute pulmonary embolism ([place]) 11/08/2019  ? Impacted cerumen of left ear 01/03/2019  ? Lesion of skin of scalp 12/30/2018  ? Edema 06/22/2018  ? Iron deficiency anemia 04/02/2018  ? Lumbar post-laminectomy syndrome 07/15/2017  ? Lumbar compression fracture ([place]) 11/26/2015  ? Lower extremity edema 10/23/2015  ? Chronic low back pain 06/21/2014  ? Pain in joint, shoulder region 02/11/2014  ? Vitamin D deficiency 03/30/2013  ? Acute kidney injury ([place]) 01/01/2012  ? Spinal stenosis 12/23/2011  ? Osteopenia 12/23/2011  ? Renal insufficiency, mild 08/24/2011  ? CAP (community acquired pneumonia) 05/08/2011  ? Vitamin B12  deficiency 09/24/2010  ? IBS (irritable bowel syndrome) 09/24/2010  ? Peripheral neuropathy 09/24/2010  ? Normocytic anemia 09/24/2010  ? GERD (gastroesophageal reflux disease) 08/12/2009  ? DIVERTICULOSIS, COLON 09/08/2007  ? Angiodysplasia of intestine with hemorrhage 09/08/2007  ? DJD (degenerative joint disease) 09/08/2007  ? COLONIC POLYPS, ADENOMATOUS, HX OF 09/08/2007  ? HYPERLIPIDEMIA 01/23/2007  ? HTN (hypertension) 01/23/2007  ? Osteoporosis 01/23/2007  ? HYPOGLYCEMIA, REACTIVE 06/30/2006  ? ? ?Orientation RESPIRATION BLADDER Height & Weight   ?  ?Self, Time, Place ? O2 (2L ) Indwelling catheter Weight: 81.9 kg ?Height:  '5\' 5"'$  (165.1 cm)  ?BEHAVIORAL SYMPTOMS/MOOD NEUROLOGICAL BOWEL NUTRITION STATUS  ?    Incontinent Diet (Heart Healthy)  ?AMBULATORY STATUS COMMUNICATION OF NEEDS Skin   ?Extensive Assist Verbally Normal ?  ?  ?  ?    ?     ?     ? ? ?Personal Care Assistance Level of Assistance  ?Bathing, Feeding, Dressing Bathing Assistance: Limited assistance ?Feeding assistance: Limited assistance ?Dressing Assistance: Limited assistance ?   ? ?Functional Limitations Info  ?Sight, Hearing, Speech Sight Info: Adequate ?Hearing Info: Adequate ?Speech Info: Adequate  ? ? ?SPECIAL CARE FACTORS FREQUENCY  ?PT (By licensed PT), OT (By licensed OT)   ?  ?PT Frequency: 5x/week ?OT Frequency: 5x/week ?  ?  ?  ?   ? ? ?Contractures Contractures Info: Not present  ? ? ?Additional Factors Info  ?Code Status, Allergies Code Status Info: DNR ?Allergies Info: Codeine, Duloxetine, Sertraline Hcl ?  ?  ?  ?   ? ?Current Medications (07/26/2021):  This is  the current hospital active medication list ?Current Facility-Administered Medications  ?Medication Dose Route Frequency Provider Last Rate Last Admin  ? 0.9 %  sodium chloride infusion  250 mL Intravenous PRN Regalado, Belkys A, MD      ? acetaminophen (TYLENOL) tablet 650 mg  650 mg Oral Q6H PRN Regalado, Belkys A, MD      ? Or  ? acetaminophen (TYLENOL) suppository  650 mg  650 mg Rectal Q6H PRN Regalado, Belkys A, MD      ? ALPRAZolam Duanne Moron) tablet 0.5 mg  0.5 mg Oral QHS PRN Regalado, Belkys A, MD      ? apixaban (ELIQUIS) tablet 2.5 mg  2.5 mg Oral BID Regalado, Belkys A, MD   2.5 mg at 07/26/21 0254  ? ascorbic acid (VITAMIN C) tablet 1,000 mg  1,000 mg Oral Daily Regalado, Belkys A, MD   1,000 mg at 07/26/21 0903  ? cefTRIAXone (ROCEPHIN) 2 g in sodium chloride 0.9 % 100 mL IVPB  2 g Intravenous Q24H Regalado, Belkys A, MD 200 mL/hr at 07/25/21 1706 2 g at 07/25/21 1706  ? Chlorhexidine Gluconate Cloth 2 % PADS 6 each  6 each Topical Daily Regalado, Belkys A, MD   6 each at 07/26/21 0907  ? cholecalciferol (VITAMIN D) tablet 2,000 Units  2,000 Units Oral Daily Regalado, Belkys A, MD   2,000 Units at 07/26/21 0907  ? diltiazem (CARDIZEM) tablet 30 mg  30 mg Oral Q12H Margie Billet, NP   30 mg at 07/26/21 2706  ? docusate sodium (COLACE) capsule 100 mg  100 mg Oral BID Regalado, Belkys A, MD   100 mg at 07/26/21 2376  ? doxycycline (VIBRAMYCIN) 100 mg in sodium chloride 0.9 % 250 mL IVPB  100 mg Intravenous Q12H Regalado, Belkys A, MD 125 mL/hr at 07/26/21 0603 100 mg at 07/26/21 0603  ? furosemide (LASIX) injection 80 mg  80 mg Intravenous Q8H Roney Jaffe, MD   80 mg at 07/26/21 1316  ? HYDROcodone-acetaminophen (NORCO/VICODIN) 5-325 MG per tablet 1 tablet  1 tablet Oral Q6H PRN Regalado, Belkys A, MD   1 tablet at 07/26/21 0427  ? ondansetron (ZOFRAN) tablet 4 mg  4 mg Oral Q6H PRN Regalado, Belkys A, MD      ? Or  ? ondansetron (ZOFRAN) injection 4 mg  4 mg Intravenous Q6H PRN Regalado, Belkys A, MD   4 mg at 07/24/21 1345  ? pantoprazole (PROTONIX) EC tablet 40 mg  40 mg Oral BID Regalado, Belkys A, MD   40 mg at 07/26/21 0906  ? polyethylene glycol (MIRALAX / GLYCOLAX) packet 17 g  17 g Oral Daily Regalado, Belkys A, MD   17 g at 07/26/21 0916  ? polyvinyl alcohol (LIQUIFILM TEARS) 1.4 % ophthalmic solution 1 drop  1 drop Both Eyes Daily PRN Regalado, Belkys A, MD       ? pregabalin (LYRICA) capsule 75 mg  75 mg Oral QHS Regalado, Belkys A, MD   75 mg at 07/25/21 2232  ? senna (SENOKOT) tablet 8.6 mg  1 tablet Oral BID Regalado, Belkys A, MD   8.6 mg at 07/26/21 0907  ? sodium chloride flush (NS) 0.9 % injection 3 mL  3 mL Intravenous Q12H Regalado, Belkys A, MD   3 mL at 07/25/21 2235  ? sodium chloride flush (NS) 0.9 % injection 3 mL  3 mL Intravenous PRN Regalado, Belkys A, MD      ? sorbitol 70 % solution 30 mL  30 mL  Oral Daily PRN Regalado, Belkys A, MD   30 mL at 07/25/21 1704  ? ? ? ?Discharge Medications: ?Please see discharge summary for a list of discharge medications. ? ?Relevant Imaging Results: ? ?Relevant Lab Results: ? ? ?Additional Information ?SS# [id number]; covid-19 vaccines - Pfizer x3 ? ?Tawanna Cooler, RN ? ? ? ? ?

## 2021-07-27 DIAGNOSIS — I509 Heart failure, unspecified: Secondary | ICD-10-CM | POA: Diagnosis not present

## 2021-07-27 LAB — RENAL FUNCTION PANEL
Albumin: 3.3 g/dL — ABNORMAL LOW (ref 3.5–5.0)
Anion gap: 15 (ref 5–15)
BUN: 58 mg/dL — ABNORMAL HIGH (ref 8–23)
CO2: 32 mmol/L (ref 22–32)
Calcium: 9.4 mg/dL (ref 8.9–10.3)
Chloride: 89 mmol/L — ABNORMAL LOW (ref 98–111)
Creatinine, Ser: 3.37 mg/dL — ABNORMAL HIGH (ref 0.44–1.00)
GFR, Estimated: 13 mL/min — ABNORMAL LOW (ref >60)
Glucose, Bld: 82 mg/dL (ref 70–99)
Phosphorus: 6.6 mg/dL — ABNORMAL HIGH (ref 2.5–4.6)
Potassium: 4 mmol/L (ref 3.5–5.1)
Sodium: 136 mmol/L (ref 135–145)

## 2021-07-27 LAB — CBC
HCT: 29.9 % — ABNORMAL LOW (ref 36.0–46.0)
Hemoglobin: 9.7 g/dL — ABNORMAL LOW (ref 12.0–15.0)
MCH: 28.2 pg (ref 26.0–34.0)
MCHC: 32.4 g/dL (ref 30.0–36.0)
MCV: 86.9 fL (ref 80.0–100.0)
Platelets: 311 10*3/uL (ref 150–400)
RBC: 3.44 MIL/uL — ABNORMAL LOW (ref 3.87–5.11)
RDW: 17.6 % — ABNORMAL HIGH (ref 11.5–15.5)
WBC: 6.2 10*3/uL (ref 4.0–10.5)
nRBC: 0 % (ref 0.0–0.2)

## 2021-07-27 MED ORDER — FUROSEMIDE 10 MG/ML IJ SOLN
40.0000 mg | Freq: Two times a day (BID) | INTRAMUSCULAR | Status: DC
  Administered 2021-07-27 – 2021-07-28 (×2): 40 mg via INTRAVENOUS
  Filled 2021-07-27 (×2): qty 4

## 2021-07-27 NOTE — Care Management Important Message (Signed)
Important Message ? ?Patient Details IM Letter placed in Patients room. ?Name: Krystal Henderson ?MRN: [id number] ?Date of Birth: 1932-09-30 ? ? ?Medicare Important Message Given:  Yes ? ? ? ? ?[name] ?07/27/2021, 11:48 AM ?

## 2021-07-27 NOTE — Progress Notes (Signed)
[facility] ?NEPHROLOGY PROGRESS NOTE ? ?Assessment/ Plan: ?Pt is a 88 y.o. yo female with with history of hypertension, CHF, CKD stage IV admitted with fluid overload/shortness of breath, seen as a consultation for AKI on CKD. ? ?#Acute kidney injury on CKD stage IV-b/l creatinine 2.1- 2.5 from 2022, egfr 17 - 21ml/min.  Creat 3.5 on admit here in setting of marked volume overload, pulm edema, possible pna. AKI likely due to decompensated CHF.  Responding very well with IV diuretics.  Fluid negative by 10 L already although only 2 kg weight change since the peak weight.  Only minimal leg edema noted.  Creatinine remain up.  I will lower Lasix to 40 mg IV twice a day. ?Continue with strict ins and outs, daily lab. ? ?#Acute CHF exacerbation/fluid overload: Volume management with diuretics as discussed above. ? ?# HTN: Blood pressure soft therefore continue to hold amlodipine and diltiazem dose was reduced.  Diuretics as above. ? ?#Community-acquired pneumonia: Currently on Doxy and ceftriaxone per primary team.  Oxygen requirement is coming down. ? ?Subjective: Seen and examined the patient at bedside.  Urine output is around 3.5 L in 24 hours.  She reports her breathing is much better.  No nausea, vomiting, chest pain.  Trying to eat breakfast.  No major event. ?Objective ?Vital signs in last 24 hours: ?Vitals:  ? 07/26/21 2030 07/26/21 2043 07/27/21 0500 07/27/21 0540  ?BP:  (!) 108/51  (!) 110/57  ?Pulse:  81  65  ?Resp: 15 18  17  ?Temp:  97.6 ?F (36.4 ?C)  97.8 ?F (36.6 ?C)  ?TempSrc:  Oral  Oral  ?SpO2:  99%  98%  ?Weight:   81 kg   ?Height:      ? ?Weight change: -0.9 kg ? ?Intake/Output Summary (Last 24 hours) at 07/27/2021 0737 ?Last data filed at 07/27/2021 0700 ?Gross per 24 hour  ?Intake 1602.14 ml  ?Output 3550 ml  ?Net -1947.86 ml  ? ? ? ? ? ?Labs: ?Basic Metabolic Panel: ?Recent Labs  ?Lab 07/25/21 ?0341 07/26/21 ?0333 07/27/21 ?0316  ?NA 136 138 136  ?K 4.3 3.9 4.0  ?CL 97* 93* 89*  ?CO2  28 32 32  ?GLUCOSE 80 74 82  ?BUN 43* 52* 58*  ?CREATININE 3.19* 3.29* 3.37*  ?CALCIUM 9.1 9.6 9.4  ?PHOS 6.0* 6.4* 6.6*  ? ?Liver Function Tests: ?Recent Labs  ?Lab 07/23/21 ?0615 07/24/21 ?0357 07/25/21 ?0341 07/26/21 ?0333 07/27/21 ?0316  ?AST 20  --   --   --   --   ?ALT 15  --   --   --   --   ?ALKPHOS 47  --   --   --   --   ?BILITOT 1.1  --   --   --   --   ?PROT 5.9*  --   --   --   --   ?ALBUMIN 2.9*   < > 3.3* 3.3* 3.3*  ? < > = values in this interval not displayed.  ? ?No results for input(s): LIPASE, AMYLASE in the last 168 hours. ?No results for input(s): AMMONIA in the last 168 hours. ?CBC: ?Recent Labs  ?Lab 07/22/21 ?1409 07/23/21 ?0615 07/24/21 ?0357 07/26/21 ?0333  ?WBC 5.6 5.1 6.1 6.3  ?NEUTROABS 3.2  --   --   --   ?HGB 8.9* 8.0* 8.0* 9.1*  ?HCT 27.7* 24.8* 25.0* 28.7*  ?MCV 88.5 88.9 88.7 87.5  ?PLT 317 304 292 305  ? ?Cardiac Enzymes: ?No results   for input(s): CKTOTAL, CKMB, CKMBINDEX, TROPONINI in the last 168 hours. ?CBG: ?No results for input(s): GLUCAP in the last 168 hours. ? ?Iron Studies: No results for input(s): IRON, TIBC, TRANSFERRIN, FERRITIN in the last 72 hours. ?Studies/Results: ?DG CHEST PORT 1 VIEW ? ?Result Date: 07/26/2021 ?CLINICAL DATA:  Acute exacerbation of congestive heart failure EXAM: PORTABLE CHEST 1 VIEW COMPARISON:  July 22, 2021 FINDINGS: The mediastinal contour is normal. The heart size is enlarged. Central pulmonary vascular congestion is identified improved compared prior exam. Patchy consolidation of left lung base is noted. Minimal right pleural effusion is identified. IMPRESSION: 1. Central pulmonary vascular congestion improved compared prior exam. 2. Patchy consolidation of left lung base superimposed pneumonia is not excluded. Electronically Signed   By: Wei-Chen  Lin M.D.   On: 07/26/2021 10:22   ? ?Medications: ?Infusions: ? sodium chloride    ? cefTRIAXone (ROCEPHIN)  IV 2 g (07/26/21 1627)  ? doxycycline (VIBRAMYCIN) IV 125 mL/hr at 07/27/21 0700   ? ? ?Scheduled Medications: ? apixaban  2.5 mg Oral BID  ? vitamin C  1,000 mg Oral Daily  ? Chlorhexidine Gluconate Cloth  6 each Topical Daily  ? cholecalciferol  2,000 Units Oral Daily  ? diltiazem  30 mg Oral Q12H  ? docusate sodium  100 mg Oral BID  ? furosemide  80 mg Intravenous Q8H  ? pantoprazole  40 mg Oral BID  ? polyethylene glycol  17 g Oral Daily  ? pregabalin  75 mg Oral QHS  ? senna  1 tablet Oral BID  ? sodium chloride flush  3 mL Intravenous Q12H  ? ? have reviewed scheduled and prn medications. ? ?Physical Exam: ?General: Lying on bed comfortable, not in distress ?Heart:RRR, s1s2 nl ?Lungs: Basal rhonchi, no wheezing or increased work of breathing ?Abdomen:soft, Non-tender, non-distended ?Extremities: Trace leg edema noted. ?Neurology: Alert awake and following commands. ? ? Prasad  ?07/27/2021,7:37 AM ? LOS: 4 days  ? ?

## 2021-07-27 NOTE — Progress Notes (Signed)
?Progress Note ? ? ?Patient: Krystal Henderson FWY:[id number] DOB: [birth year] DOA: 07/22/2021     4 ?DOS: the patient was seen and examined on 07/27/2021 ?  ?Brief hospital course: ?88 year old with past medical history significant for diastolic and systolic heart failure ejection fraction 45 to 50%, hypertension, chronic low back pain, compression fracture, B12 deficiency GERD who presents with generalized weakness, inability to ambulate.  She also complains of dry cough, shortness of breath, lower extremity edema. ? ?Patient admitted with acute on chronic heart failure exacerbation and AKI on CKD stage IV. ? ?Assessment and Plan: ?* Acute exacerbation of CHF (congestive heart failure) ([place]) ?Patient presents with cough, SOB, elevation BNP, weakness, LE edema. Chest x ray with HF finding.  ?ECHO; Ef 55 % ?Weight on admission 160 pounds--184--180 ?Cardiology consulted and sign off.  ?Good urine out put. ?Received 3 days of  Metolazone.  ?Defer to nephrologist transition to oral diuretics.  ?Lasix decreased to 40 mg IV BID by nephrologist  ? ?Leg wound, right ?She has Right LE wound after excision of basal cell cancer 2 weeks ago.  ?She has some redness. Her dermatologist prescribe Doxycycline for 7 days to start on 4/19. ?Continue with antibiotics.  ?Hypothermia last night. blood culture no growth to date.  ? ?Headache ?CT head negative for acute finding.  ?Her pain is more scalp pain, irritation.  ?Needs to follow up with dermatologist  ? ?CKD (chronic kidney disease) stage 4, GFR 15-29 ml/min ([place]) ?AKI on CKD stage IV, concern for cardiorenal syndrome.  ?Cr per records 2.5---2.1 ?Cr on admission 3.5--3.0--3.1-3.2 ?Nephrology consulted to assist with diuresis.  ?Renal US no hydronephrosis.  ?Continue to have good urine out put.  ?continue with lasix. Metolazone discontinue.  ? ?Generalized weakness ?Suspect deconditioning, als related to HF, LE edema.  ?PT /OT consult.  ?BL weakness improving.  ? ?Paroxysmal atrial  fibrillation ([place]) ?On diltiazem dose reduce.  ?GI, recommend to resume eliquis and monitor.  ? ?CAP (community acquired pneumonia) ?She report cough. Chest x ray Left Lower lobe atelectasis vs consolidations.  ?Pro-calcitonin not significantly elevated but will Continue  ceftriaxone and Doxy to cover for PNA and LE cellulitis. Plan for 7 days antibiotics.  ?Day 6 antibiotics.  ? ?Normocytic anemia ?Hb trending down, hb one moth ago at 10.  ?Hb on admission 8.9---8.0.--9 ?Iron: 62 T sat 10. Ferritin 21/ will discussed with nephrology in regards aranesp/Iron. ?GI consulted, and recommend to monitor patient on eliquis.  ?Hb stable ? ?Peripheral neuropathy ?Continue with lyrica.  ? ?Vitamin B12 deficiency ?On supplement.  ? ?DJD (degenerative joint disease) ?Vicodin  for chronic back pain and headaches.  ? ?GERD (gastroesophageal reflux disease) ?Continue with PPI.  ? ?HTN (hypertension) ?BP soft, hold amlodipine and Cozaar.  ? ? ? ? ?  ? ?Subjective: Feeling ok, denies dyspnea. Feels weak.  ? ?Physical Exam: ?Vitals:  ? 07/26/21 2043 07/27/21 0500 07/27/21 0540 07/27/21 1140  ?BP: (!) 108/51  (!) 110/57 (!) 101/49  ?Pulse: 81  65 78  ?Resp: '18  17 14  '$ ?Temp: 97.6 ?F (36.4 ?C)  97.8 ?F (36.6 ?C) 98.4 ?F (36.9 ?C)  ?TempSrc: Oral  Oral Oral  ?SpO2: 99%  98% 100%  ?Weight:  81 kg    ?Height:      ? ?General; NAD ?Lung; CTA ?Extremities; no edema ? ?Data Reviewed: ? ?Cbc and Bmet ? ?Family Communication: care discussed with husband.  ? ?Disposition: ?Status is: Inpatient ?Remains inpatient appropriate because: management of renal failure ?  Planned Discharge Destination: Skilled nursing facility ? ? ? ?Time spent: 35 minutes ? ?Author: ?Elmarie Shiley, MD ?07/27/2021 1:43 PM ? ?For on call review [email or web address].  ?

## 2021-07-27 NOTE — Discharge Instructions (Signed)

## 2021-07-28 DIAGNOSIS — I509 Heart failure, unspecified: Secondary | ICD-10-CM | POA: Diagnosis not present

## 2021-07-28 LAB — RENAL FUNCTION PANEL
Albumin: 3.1 g/dL — ABNORMAL LOW (ref 3.5–5.0)
Anion gap: 14 (ref 5–15)
BUN: 68 mg/dL — ABNORMAL HIGH (ref 8–23)
CO2: 36 mmol/L — ABNORMAL HIGH (ref 22–32)
Calcium: 9.3 mg/dL (ref 8.9–10.3)
Chloride: 87 mmol/L — ABNORMAL LOW (ref 98–111)
Creatinine, Ser: 3.45 mg/dL — ABNORMAL HIGH (ref 0.44–1.00)
GFR, Estimated: 12 mL/min — ABNORMAL LOW (ref >60)
Glucose, Bld: 90 mg/dL (ref 70–99)
Phosphorus: 6.4 mg/dL — ABNORMAL HIGH (ref 2.5–4.6)
Potassium: 3.5 mmol/L (ref 3.5–5.1)
Sodium: 137 mmol/L (ref 135–145)

## 2021-07-28 LAB — CULTURE, BLOOD (ROUTINE X 2)
Culture: NO GROWTH
Culture: NO GROWTH
Special Requests: ADEQUATE
Special Requests: ADEQUATE

## 2021-07-28 MED ORDER — LIDOCAINE HCL URETHRAL/MUCOSAL 2 % EX GEL
1.0000 "application " | Freq: Once | CUTANEOUS | Status: AC
  Administered 2021-07-28: 1 via TOPICAL
  Filled 2021-07-28: qty 5

## 2021-07-28 NOTE — Progress Notes (Signed)
Occupational Therapy Treatment ?Patient Details ?Name: Krystal Henderson ?MRN: [id number] ?DOB: 11-08-1932 ?Today's Date: 07/28/2021 ? ? ?History of present illness 88 y.o. female with medical history significant of  Diastolic, Systolic HF Ef 47-65 %, hypertension, chronic low back pain, compression fracture, GERD, B12 deficiency, who presents with generalized weakness, inability to stand up from her recliner. Dx of CHF, CAP ?  ?OT comments ? Patient was noted to have some discouragement during session on how much she was able to do for herself. Patients thoughts and concerns were heard and validated. Patient was educated on importance of trying being the key and the purpose of participation in therapy. Patient endorsed that she will try. Patient was able to participate in scooting to head of bed with mod A and increased time. Patient  was educated on pressure relief strategies to participate in bed to reduce risks of pressure build up. Patient and patients husband verbalized understanding. Patient would continue to benefit from skilled OT services at this time while admitted and after d/c to address noted deficits in order to improve overall safety and independence in ADLs.  ?Patient's discharge plan remains appropriate at this time. OT will continue to follow acutely.  ?  ? ?Recommendations for follow up therapy are one component of a multi-disciplinary discharge planning process, led by the attending physician.  Recommendations may be updated based on patient status, additional functional criteria and insurance authorization. ?   ?Follow Up Recommendations ? Skilled nursing-short term rehab (<3 hours/day)  ?  ?Assistance Recommended at Discharge Frequent or constant Supervision/Assistance  ?Patient can return home with the following ? A lot of help with walking and/or transfers;A lot of help with bathing/dressing/bathroom;Assistance with cooking/housework;Help with stairs or ramp for entrance;Assist for  transportation ?  ?Equipment Recommendations ? Other (comment) (defer to next venue)  ?  ?Recommendations for Other Services   ? ?  ?Precautions / Restrictions Precautions ?Precautions: Fall ?Precaution Comments: Monitor BP-orthostatic; spouse reports 1 fall when getting out of bathtub 3 months ago (no other falls in 6 months) ?Restrictions ?Weight Bearing Restrictions: No  ? ? ?  ? ?Mobility Bed Mobility ?Overal bed mobility: Needs Assistance ?  ?  ?  ?  ?Sit to supine: Mod assist, HOB elevated ?  ?General bed mobility comments: cues for sequence and self assist. incr time ?  ? ?Transfers ?  ?  ?  ?  ?  ?  ?  ?  ?  ?  ?  ?  ?Balance Overall balance assessment: Needs assistance ?Sitting-balance support: No upper extremity supported, Feet supported ?Sitting balance-Leahy Scale: Fair ?  ?  ?  ?  ?  ?  ?  ?  ?  ?  ?  ?  ?  ?  ?  ?  ?   ? ?ADL either performed or assessed with clinical judgement  ? ?ADL Overall ADL's : Needs assistance/impaired ?  ?  ?  ?  ?  ?  ?  ?  ?  ?  ?  ?  ?  ?  ?  ?  ?  ?  ?  ?General ADL Comments: patient was seen after PT session with patient reporting she was fatigued. patient was noted to state that she is not going to get better and she wanted her husband to take care of her. patient's thoughts and concerns were heard and validated. patient was educated on therapy being able to find how to [facility] her independence as long as she  was willing to try. patient reported that she wanted to try but was fatigued at this time. patient engaged in scooting up to head of bed with patient able to push bottom up off bed herself then with mod A to guide bottom up in the bed. patient was educated on her partiipation in task. patient verbalized understanding. patient husband was present to provide encouragement to participate in task. patient was educated on importance of keeping limbs moving in bed and assisting NTs with daily care tasks. patient and husband verbalized understanding and patient reported  that she would try. ?  ? ?Extremity/Trunk Assessment   ?  ?  ?  ?  ?  ? ?Vision   ?  ?  ?Perception   ?  ?Praxis   ?  ? ?Cognition Arousal/Alertness: Awake/alert ?Behavior During Therapy: Flat affect ?Overall Cognitive Status: Within Functional Limits for tasks assessed ?  ?  ?  ?  ?  ?  ?  ?  ?  ?  ?  ?  ?  ?  ?  ?  ?General Comments: patient was noted to be down on herself at times during session with continued eduaction and encouragement from husband. ?  ?  ?   ?Exercises   ? ?  ?Shoulder Instructions   ? ? ?  ?General Comments    ? ? ?Pertinent Vitals/ Pain       Pain Assessment ?Pain Assessment: Faces ?Faces Pain Scale: Hurts even more ?Pain Location: neck, back and sacral area ?Pain Descriptors / Indicators: Grimacing, Discomfort, Sore ?Pain Intervention(s): Limited activity within patient's tolerance, Monitored during session, Repositioned ? ?Home Living   ?  ?  ?  ?  ?  ?  ?  ?  ?  ?  ?  ?  ?  ?  ?  ?  ?  ?  ? ?  ?Prior Functioning/Environment    ?  ?  ?  ?   ? ?Frequency ? Min 2X/week  ? ? ? ? ?  ?Progress Toward Goals ? ?OT Goals(current goals can now be found in the care plan section) ? Progress towards OT goals: Progressing toward goals ? ?   ?Plan Discharge plan remains appropriate   ? ?Co-evaluation ? ? ?   ?  ?  ?  ?  ? ?  ?AM-PAC OT "6 Clicks" Daily Activity     ?Outcome Measure ? ? Help from another person eating meals?: A Little ?Help from another person taking care of personal grooming?: A Little ?Help from another person toileting, which includes using toliet, bedpan, or urinal?: A Lot ?Help from another person bathing (including washing, rinsing, drying)?: A Lot ?Help from another person to put on and taking off regular upper body clothing?: A Little ?Help from another person to put on and taking off regular lower body clothing?: Total ?6 Click Score: 14 ? ?  ?End of Session Equipment Utilized During Treatment: Oxygen ? ?OT Visit Diagnosis: Unsteadiness on feet (R26.81);Other abnormalities of  gait and mobility (R26.89);Muscle weakness (generalized) (M62.81) ?  ?Activity Tolerance Patient tolerated treatment well ?  ?Patient Left in bed;with call bell/phone within reach;with family/visitor present ?  ?Nurse Communication   ?  ? ?   ? ?Time: 0960-4540 ?OT Time Calculation (min): 26 min ? ?Charges: OT General Charges ?$OT Visit: 1 Visit ?OT Treatments ?$Therapeutic Activity: 23-37 mins ? ?Aibhlinn Kalmar OTR/L, MS ?Acute Rehabilitation Department ?Office# [phone] ?Pager# [phone] ? ? ?Feliz Beam Nimrod Wendt ?07/28/2021, 4:40 PM ?

## 2021-07-28 NOTE — Progress Notes (Signed)
Physical Therapy Treatment ?Patient Details ?Name: Krystal Henderson ?MRN: [id number] ?DOB: [birth year] ?Today's Date: 07/28/2021 ? ? ?History of Present Illness 88 y.o. female with medical history significant of  Diastolic, Systolic HF Ef 24-09 %, hypertension, chronic low back pain, compression fracture, GERD, B12 deficiency, who presents with generalized weakness, inability to stand up from her recliner. Dx of CHF, CAP ? ?  ?PT Comments  ? ? Pt is progressing slowly toward goals. Participates with  encouragement. Fatigues easily, c/o mild dizziness however it did not worsen during session. Given pt deconditioned state, weakness and decr balance all placing her at risk for falls---> continue to recommend SNF    ?Recommendations for follow up therapy are one component of a multi-disciplinary discharge planning process, led by the attending physician.  Recommendations may be updated based on patient status, additional functional criteria and insurance authorization. ? ?Follow Up Recommendations ? Skilled nursing-short term rehab (<3 hours/day) ?  ?  ?Assistance Recommended at Discharge Frequent or constant Supervision/Assistance  ?Patient can return home with the following A lot of help with walking and/or transfers;A lot of help with bathing/dressing/bathroom;Direct supervision/assist for medications management;Assist for transportation;Help with stairs or ramp for entrance ?  ?Equipment Recommendations ? None recommended by PT  ?  ?Recommendations for Other Services   ? ? ?  ?Precautions / Restrictions Precautions ?Precautions: Fall ?Restrictions ?Weight Bearing Restrictions: No  ?  ? ?Mobility ? Bed Mobility ?Overal bed mobility: Needs Assistance ?Bed Mobility: Rolling, Sidelying to Sit ?Rolling: Mod assist ?Sidelying to sit: Mod assist ?  ?  ?  ?General bed mobility comments: cues for sequence and self assist. incr time ?  ? ?Transfers ?Overall transfer level: Needs assistance ?Equipment used: Rolling walker (2  wheels) ?Transfers: Sit to/from Stand ?  ?  ?  ?  ?  ?  ?General transfer comment: repeated x3 for strengthening and tolerance.min assist to power up. cues for hand placement and self assist. pt requires max encouragement ?  ? ?Ambulation/Gait ?Ambulation/Gait assistance: Min assist ?  ?Assistive device: Rolling walker (2 wheels) ?  ?  ?  ?  ?General Gait Details: lateral steps along EOB with min assist and step by step cues. assist to steady and move RW ? ? ?Stairs ?  ?  ?  ?  ?  ? ? ?Wheelchair Mobility ?  ? ?Modified Rankin (Stroke Patients Only) ?  ? ? ?  ?Balance   ?  ?  ?  ?  ?  ?  ?  ?  ?  ?  ?  ?  ?  ?  ?  ?  ?  ?  ?  ? ?  ?Cognition Arousal/Alertness: Awake/alert ?Behavior During Therapy: Flat affect ?Overall Cognitive Status: Within Functional Limits for tasks assessed ?Area of Impairment: Following commands ?  ?  ?  ?  ?  ?  ?  ?  ?  ?  ?  ?Following Commands: Follows one step commands with increased time, Follows multi-step commands inconsistently ?  ?  ?  ?  ?  ?  ? ?  ?Exercises General Exercises - Lower Extremity ?Ankle Circles/Pumps: AROM, Both, 10 reps, Supine ?Heel Slides: AAROM, Both, 10 reps, Supine ? ?  ?General Comments   ?  ?  ? ?Pertinent Vitals/Pain Pain Assessment ?Pain Assessment: Faces ?Faces Pain Scale: Hurts even more ?Pain Location: neck, back and sacral area ?Pain Descriptors / Indicators: Grimacing, Discomfort, Sore ?Pain Intervention(s): Limited activity within patient's tolerance, Monitored during session,  Premedicated before session, Repositioned  ? ? ?Home Living   ?  ?  ?  ?  ?  ?  ?  ?  ?  ?   ?  ?Prior Function    ?  ?  ?   ? ?PT Goals (current goals can now be found in the care plan section) Acute Rehab PT Goals ?Patient Stated Goal: to get strength back ?PT Goal Formulation: With patient/family ?Time For Goal Achievement: 08/06/21 ?Potential to Achieve Goals: Good ?Progress towards PT goals: Progressing toward goals ? ?  ?Frequency ? ? ? Min 2X/week ? ? ? ?  ?PT Plan Current  plan remains appropriate  ? ? ?Co-evaluation   ?  ?  ?  ?  ? ?  ?AM-PAC PT "6 Clicks" Mobility   ?Outcome Measure ? Help needed turning from your back to your side while in a flat bed without using bedrails?: A Lot ?Help needed moving from lying on your back to sitting on the side of a flat bed without using bedrails?: A Lot ?Help needed moving to and from a bed to a chair (including a wheelchair)?: A Lot ?Help needed standing up from a chair using your arms (e.g., wheelchair or bedside chair)?: A Lot ?Help needed to walk in hospital room?: Total ?Help needed climbing 3-5 steps with a railing? : Total ?6 Click Score: 10 ? ?  ?End of Session Equipment Utilized During Treatment: Gait belt ?Activity Tolerance: Patient limited by fatigue;Patient tolerated treatment well ?Patient left: with family/visitor present;with call bell/phone within reach;Other (comment) (EOB with OT) ?  ?PT Visit Diagnosis: Difficulty in walking, not elsewhere classified (R26.2);History of falling (Z91.81) ?  ? ? ?Time: 2130-8657 ?PT Time Calculation (min) (ACUTE ONLY): 20 min ? ?Charges:  $Therapeutic Activity: 8-22 mins          ?          ? Baxter Flattery, PT ? ?Acute Rehab Dept [facility]) [phone] ?Pager [phone] ? ?07/28/2021 ? ? ? ?Tripp Goins ?07/28/2021, 2:55 PM ? ?

## 2021-07-28 NOTE — Progress Notes (Signed)
[facility] ?NEPHROLOGY PROGRESS NOTE ? ?Assessment/ Plan: ?Pt is a 88 y.o. yo female with with history of hypertension, CHF, CKD stage IV admitted with fluid overload/shortness of breath, seen as a consultation for AKI on CKD. ? ?#Acute kidney injury on CKD stage IV-b/l creatinine 2.1- 2.5 from 2022, egfr 17 - 48m/min.  Creat 3.5 on admit here in setting of marked volume overload, pulm edema, possible pna. AKI likely due to decompensated CHF.  Responded with IV diuretics with -13 L since admission.  The volume/edema is much better.  Noted BUN/creatinine level trending up, I will hold diuretics today.  Hopefully the creatinine level will plateau in next 1 or 2 days and then we will switch to oral Lasix.  She will need higher dose of oral diuretics on discharge. Continue with strict ins and outs, daily lab. ? ?#Acute CHF exacerbation/fluid overload: Volume management with diuretics as discussed above. ? ?# HTN: Blood pressure soft therefore continue to hold amlodipine and diltiazem dose was reduced.  Diuretics as above. ? ?#Community-acquired pneumonia: Currently on Doxy and ceftriaxone per primary team.  Oxygen requirement is coming down. ? ?Subjective: Seen and examined the patient at bedside.  Urine output is around 3.5 L in 24 hours.  She was sleeping this morning.  Opening her eyes with the name.  No major event. ?Objective ?Vital signs in last 24 hours: ?Vitals:  ? 07/28/21 0000 07/28/21 0100 07/28/21 0200 07/28/21 0456  ?BP:    106/60  ?Pulse:    77  ?Resp: 10 12 (!) 9 19  ?Temp:    97.7 ?F (36.5 ?C)  ?TempSrc:    Oral  ?SpO2:    96%  ?Weight:      ?Height:      ? ?Weight change:  ? ?Intake/Output Summary (Last 24 hours) at 07/28/2021 0714 ?Last data filed at 07/28/2021 0500 ?Gross per 24 hour  ?Intake 583 ml  ?Output 3475 ml  ?Net -2892 ml  ? ? ? ? ? ? ?Labs: ?Basic Metabolic Panel: ?Recent Labs  ?Lab 07/26/21 ?0333 07/27/21 ?0316 07/28/21 ?0358  ?NA 138 136 137  ?K 3.9 4.0 3.5  ?CL 93* 89* 87*   ?CO2 32 32 36*  ?GLUCOSE 74 82 90  ?BUN 52* 58* 68*  ?CREATININE 3.29* 3.37* 3.45*  ?CALCIUM 9.6 9.4 9.3  ?PHOS 6.4* 6.6* 6.4*  ? ? ?Liver Function Tests: ?Recent Labs  ?Lab 07/23/21 ?0615 07/24/21 ?0196204/23/23 ?0229704/24/23 ?0989204/25/23 ?01194 ?AST 20  --   --   --   --   ?ALT 15  --   --   --   --   ?ALKPHOS 47  --   --   --   --   ?BILITOT 1.1  --   --   --   --   ?PROT 5.9*  --   --   --   --   ?ALBUMIN 2.9*   < > 3.3* 3.3* 3.1*  ? < > = values in this interval not displayed.  ? ? ?No results for input(s): LIPASE, AMYLASE in the last 168 hours. ?No results for input(s): AMMONIA in the last 168 hours. ?CBC: ?Recent Labs  ?Lab 07/22/21 ?1409 07/23/21 ?0615 07/24/21 ?0174004/23/23 ?0814404/24/23 ?08185 ?WBC 5.6 5.1 6.1 6.3 6.2  ?NEUTROABS 3.2  --   --   --   --   ?HGB 8.9* 8.0* 8.0* 9.1* 9.7*  ?HCT 27.7* 24.8* 25.0* 28.7* 29.9*  ?MCV 88.5 88.9 88.7 87.5  86.9  ?PLT 317 304 292 305 311  ? ? ?Cardiac Enzymes: ?No results for input(s): CKTOTAL, CKMB, CKMBINDEX, TROPONINI in the last 168 hours. ?CBG: ?No results for input(s): GLUCAP in the last 168 hours. ? ?Iron Studies: No results for input(s): IRON, TIBC, TRANSFERRIN, FERRITIN in the last 72 hours. ?Studies/Results: ?DG CHEST PORT 1 VIEW ? ?Result Date: 07/26/2021 ?CLINICAL DATA:  Acute exacerbation of congestive heart failure EXAM: PORTABLE CHEST 1 VIEW COMPARISON:  July 22, 2021 FINDINGS: The mediastinal contour is normal. The heart size is enlarged. Central pulmonary vascular congestion is identified improved compared prior exam. Patchy consolidation of left lung base is noted. Minimal right pleural effusion is identified. IMPRESSION: 1. Central pulmonary vascular congestion improved compared prior exam. 2. Patchy consolidation of left lung base superimposed pneumonia is not excluded. Electronically Signed   By: Abelardo Diesel M.D.   On: 07/26/2021 10:22   ? ?Medications: ?Infusions: ? sodium chloride    ? cefTRIAXone (ROCEPHIN)  IV 2 g (07/27/21 1851)  ?  doxycycline (VIBRAMYCIN) IV 100 mg (07/28/21 0618)  ? ? ?Scheduled Medications: ? apixaban  2.5 mg Oral BID  ? vitamin C  1,000 mg Oral Daily  ? Chlorhexidine Gluconate Cloth  6 each Topical Daily  ? cholecalciferol  2,000 Units Oral Daily  ? diltiazem  30 mg Oral Q12H  ? docusate sodium  100 mg Oral BID  ? pantoprazole  40 mg Oral BID  ? polyethylene glycol  17 g Oral Daily  ? pregabalin  75 mg Oral QHS  ? senna  1 tablet Oral BID  ? sodium chloride flush  3 mL Intravenous Q12H  ? ? have reviewed scheduled and prn medications. ? ?Physical Exam: ?General: Elderly female, lying on bed comfortable, sleeping opens eyes with the name ?Heart:RRR, s1s2 nl ?Lungs: Basal rhonchi, no increased work of breathing ?Abdomen:soft, Non-tender, non-distended ?Extremities: Only trace edema present in legs ?Neurology: Comfortable and sleeping. ? ?[place] ?07/28/2021,7:14 AM ? LOS: 5 days  ? ?

## 2021-07-28 NOTE — Progress Notes (Addendum)
?Progress Note ? ? ?Patient: Krystal Henderson ESP:[id number] DOB: Nov 08, 1932 DOA: 07/22/2021     5 ?DOS: the patient was seen and examined on 07/28/2021 ?  ?Brief hospital course: ?88 year old with past medical history significant for diastolic and systolic heart failure ejection fraction 45 to 50%, hypertension, chronic low back pain, compression fracture, B12 deficiency GERD who presents with generalized weakness, inability to ambulate.  She also complains of dry cough, shortness of breath, lower extremity edema. ? ?Patient admitted with acute on chronic heart failure exacerbation and AKI on CKD stage IV. She was diuresed. Lasix on hold due to increasing cr. Plan to monitor of Diuretics. She will need SNF at discharge.  ? ?Assessment and Plan: ?* Acute exacerbation of CHF (congestive heart failure) ([place]) ?Patient presents with cough, SOB, elevation BNP, weakness, LE edema. Chest x ray with HF finding.  ?ECHO; Ef 55 % ?Weight on admission 160 pounds--184--180-178 ?Cardiology consulted and sign off.  ?Good urine out put. ?Received 3 days of  Metolazone.  ?Holding lasix today due to increase in Cr. Per nephrology she will need higher dose of lasix at discharge.  ? ?Leg wound, right ?She has Right LE wound after excision of basal cell cancer 2 weeks ago.  ?She has some redness. Her dermatologist prescribe Doxycycline for 7 days to start on 4/19. ?Continue with antibiotics.  ?Blood culture no growth to date.  ? ?Headache ?CT head negative for acute finding.  ?Her pain is more scalp pain, irritation.  ?Needs to follow up with dermatologist  ? ?CKD (chronic kidney disease) stage 4, GFR 15-29 ml/min ([place]) ?AKI on CKD stage IV, concern for cardiorenal syndrome.  ?Cr per records 2.5---2.1 ?Cr on admission 3.5--3.0--3.1-3.2 ?Nephrology consulted to assist with diuresis.  ?Renal US no hydronephrosis.  ?Continue to have good urine out put.  ?Holding diuretics, monitor Cr.  ?Appreciate nephrology assistance.  ? ?Generalized  weakness ?Suspect deconditioning, als related to HF, LE edema.  ?PT /OT consult.  ?BL weakness improving.  ? ?Paroxysmal atrial fibrillation ([place]) ?On diltiazem dose reduce.  ?GI, recommend to resume eliquis and monitor.  ?Hb has remain stable.  ? ?CAP (community acquired pneumonia) ?She report cough. Chest x ray Left Lower lobe atelectasis vs consolidations.  ?Pro-calcitonin not significantly elevated but will Continue  ceftriaxone and Doxy to cover for PNA and LE cellulitis. Plan for 7 days antibiotics.  ?Day 7 antibiotics.  ? ?Normocytic anemia ?Hb trending down, hb one moth ago at 10.  ?Hb on admission 8.9---8.0.--9 ?Iron: 34 T sat 10. Ferritin 21/ will discussed with nephrology in regards aranesp/Iron. ?GI consulted, and recommend to monitor patient on eliquis.  ?Hb stable ? ?Peripheral neuropathy ?Continue with lyrica.  ? ?Vitamin B12 deficiency ?On supplement.  ? ?DJD (degenerative joint disease) ?Vicodin  for chronic back pain and headaches.  ? ?GERD (gastroesophageal reflux disease) ?Continue with PPI.  ? ?HTN (hypertension) ?BP soft, hold amlodipine and Cozaar.  ? ? ?Perineal fissure; will try lidocaine jelly  ? ?  ? ?Subjective; she is complaining of pain in her buttock, denies dyspnea.  ? ? ?Physical Exam: ?Vitals:  ? 07/28/21 0000 07/28/21 0100 07/28/21 0200 07/28/21 0456  ?BP:    106/60  ?Pulse:    77  ?Resp: 10 12 (!) 9 19  ?Temp:    97.7 ?F (36.5 ?C)  ?TempSrc:    Oral  ?SpO2:    96%  ?Weight:      ?Height:      ? ?General; NAD ?Lung: CTA ?Extremities;  No edema ? ?Data Reviewed: ? ?Cbc and Bmet ? ?Family Communication: care discussed with husband.  ? ?Disposition: ?Status is: Inpatient ?Remains inpatient appropriate because: management of renal failure ? Planned Discharge Destination: Skilled nursing facility ? ? ? ?Time spent: 35 minutes ? ?Author: ?Elmarie Shiley, MD ?07/28/2021 12:37 PM ? ?For on call review [email or web address].  ?

## 2021-07-29 DIAGNOSIS — N179 Acute kidney failure, unspecified: Secondary | ICD-10-CM

## 2021-07-29 DIAGNOSIS — I509 Heart failure, unspecified: Secondary | ICD-10-CM | POA: Diagnosis not present

## 2021-07-29 DIAGNOSIS — R627 Adult failure to thrive: Secondary | ICD-10-CM

## 2021-07-29 DIAGNOSIS — I480 Paroxysmal atrial fibrillation: Secondary | ICD-10-CM

## 2021-07-29 LAB — CBC
HCT: 27.6 % — ABNORMAL LOW (ref 36.0–46.0)
Hemoglobin: 8.9 g/dL — ABNORMAL LOW (ref 12.0–15.0)
MCH: 28 pg (ref 26.0–34.0)
MCHC: 32.2 g/dL (ref 30.0–36.0)
MCV: 86.8 fL (ref 80.0–100.0)
Platelets: 302 10*3/uL (ref 150–400)
RBC: 3.18 MIL/uL — ABNORMAL LOW (ref 3.87–5.11)
RDW: 17.5 % — ABNORMAL HIGH (ref 11.5–15.5)
WBC: 6.2 10*3/uL (ref 4.0–10.5)
nRBC: 0 % (ref 0.0–0.2)

## 2021-07-29 LAB — RENAL FUNCTION PANEL
Albumin: 3.1 g/dL — ABNORMAL LOW (ref 3.5–5.0)
Anion gap: 14 (ref 5–15)
BUN: 77 mg/dL — ABNORMAL HIGH (ref 8–23)
CO2: 34 mmol/L — ABNORMAL HIGH (ref 22–32)
Calcium: 9 mg/dL (ref 8.9–10.3)
Chloride: 87 mmol/L — ABNORMAL LOW (ref 98–111)
Creatinine, Ser: 3.27 mg/dL — ABNORMAL HIGH (ref 0.44–1.00)
GFR, Estimated: 13 mL/min — ABNORMAL LOW (ref >60)
Glucose, Bld: 93 mg/dL (ref 70–99)
Phosphorus: 7.3 mg/dL — ABNORMAL HIGH (ref 2.5–4.6)
Potassium: 3.3 mmol/L — ABNORMAL LOW (ref 3.5–5.1)
Sodium: 135 mmol/L (ref 135–145)

## 2021-07-29 MED ORDER — DILTIAZEM HCL 30 MG PO TABS
30.0000 mg | ORAL_TABLET | Freq: Two times a day (BID) | ORAL | Status: DC
  Administered 2021-07-29 – 2021-07-31 (×4): 30 mg via ORAL
  Filled 2021-07-29 (×4): qty 1

## 2021-07-29 MED ORDER — BISACODYL 10 MG RE SUPP
10.0000 mg | Freq: Once | RECTAL | Status: AC
  Administered 2021-07-29: 10 mg via RECTAL
  Filled 2021-07-29: qty 1

## 2021-07-29 MED ORDER — METOPROLOL TARTRATE 25 MG PO TABS: 12.5000 mg | ORAL_TABLET | Freq: Two times a day (BID) | ORAL | Status: DC

## 2021-07-29 MED ORDER — POTASSIUM CHLORIDE CRYS ER 20 MEQ PO TBCR
40.0000 meq | EXTENDED_RELEASE_TABLET | Freq: Once | ORAL | Status: AC
  Administered 2021-07-29: 40 meq via ORAL
  Filled 2021-07-29: qty 2

## 2021-07-29 MED ORDER — POLYETHYLENE GLYCOL 3350 17 G PO PACK
17.0000 g | PACK | Freq: Two times a day (BID) | ORAL | Status: DC
  Administered 2021-07-29 – 2021-08-01 (×6): 17 g via ORAL
  Filled 2021-07-29 (×6): qty 1

## 2021-07-29 NOTE — Plan of Care (Signed)
?  Problem: Education: ?Goal: Ability to demonstrate management of disease process will improve ?Outcome: Not Progressing ?Goal: Ability to verbalize understanding of medication therapies will improve ?Outcome: Not Progressing ?  ?

## 2021-07-29 NOTE — Consult Note (Signed)
[facility] Nurse Consult Note: ?Reason for Consult:coccygeal wound with surrounding maceration. Patient's husband reports that she is periodically bothered by fissures in this area. No areas probe with any depth. ?Wound type: moisture, full thickness ?Pressure Injury POA: N/A ?Measurement: affected area measures 8cm x 6cm with bleeding perianal linear opening measuring 2cm x 0.4cm x 0.2cm ?Wound bed:As described above ?Drainage (amount, consistency, odor) serosanguinous, scant ?Periwound: macerated ?Dressing procedure/placement/frequency: Patient is on a mattress replacement with low air loss feature and is being turned and repositioned from side to side. I will provide nursing with orders for treatment of the coccygeal area using a silver hydrofiber with known hemostatic properties topped with dry gauze and covered with silicone foam. An order for a pressure redistribution chair pad is placed for when patient is OOB in the chair.  ?The patient and his wife report that the Dermatologist instructed them to place white petrolatum gauze over the biopsy[sy site on the RLE twice daily and that ist is being done once daily at this time. I have amended the wound care orders to the RLE to twice daily per their request. ? ?[place] nursing team will not follow, but will remain available to this patient, the nursing and medical teams.  Please re-consult if needed. ?Thanks, ?Maudie Flakes, MSN, RN, [place], [place], CWON-AP, [place]  ?Pager# [phone]  ? ? ? ?  ?

## 2021-07-29 NOTE — Progress Notes (Signed)
Occupational Therapy Treatment ?Patient Details ?Name: Krystal Henderson ?MRN: [id number] ?DOB: [birth year] ?Today's Date: 07/29/2021 ? ? ?History of present illness 88 y.o. female with medical history significant of  Diastolic, Systolic HF Ef 35-46 %, hypertension, chronic low back pain, compression fracture, GERD, B12 deficiency, who presents with generalized weakness, inability to stand up from her recliner. Dx of CHF, CAP ?  ?OT comments ? Patient max assist to transfer to side of bed today and limited by persistent dizziness in seated position. BP 103/52. Patient min assist to stand and min assist to stand pivot holding on to therapist to recliner. Patient found on 1 L [place] but o2 sats maintained above 92% on RA. Patient left in chair with breakfast tray.  ? ?Recommendations for follow up therapy are one component of a multi-disciplinary discharge planning process, led by the attending physician.  Recommendations may be updated based on patient status, additional functional criteria and insurance authorization. ?   ?Follow Up Recommendations ? Skilled nursing-short term rehab (<3 hours/day)  ?  ?Assistance Recommended at Discharge Frequent or constant Supervision/Assistance  ?Patient can return home with the following ? A lot of help with walking and/or transfers;A lot of help with bathing/dressing/bathroom;Assistance with cooking/housework;Help with stairs or ramp for entrance;Assist for transportation ?  ?Equipment Recommendations ? Other (comment) (defer)  ?  ?Recommendations for Other Services   ? ?  ?Precautions / Restrictions Precautions ?Precautions: Fall ?Precaution Comments: Monitor BP-orthostatic; spouse reports 1 fall when getting out of bathtub 3 months ago (no other falls in 6 months) ?Restrictions ?Weight Bearing Restrictions: No  ? ? ?  ? ?Mobility Bed Mobility ?Overal bed mobility: Needs Assistance ?Bed Mobility: Supine to Sit ?  ?  ?Supine to sit: Max assist, HOB elevated, +2 for safety/equipment ?  ?   ?General bed mobility comments: Max assist to transfer into sitting today - assistance for legs, trunk and pivoting of hips ?  ? ?Transfers ?Overall transfer level: Needs assistance ?Equipment used: Rolling walker (2 wheels) ?Transfers: Sit to/from Stand, Bed to chair/wheelchair/BSC ?Sit to Stand: Min assist, From elevated surface ?Stand pivot transfers: Min assist ?  ?  ?  ?  ?General transfer comment: Min assist to pivot to recliner with patinet holding on to therapist ?  ?  ?Balance Overall balance assessment: Needs assistance ?Sitting-balance support: Single extremity supported ?Sitting balance-Leahy Scale: Poor ?Sitting balance - Comments: dizziness at edge of bed - requiring UE support ?  ?Standing balance support: During functional activity, Reliant on assistive device for balance ?Standing balance-Leahy Scale: Poor ?  ?  ?  ?  ?  ?  ?  ?  ?  ?  ?  ?  ?   ? ?ADL either performed or assessed with clinical judgement  ? ?ADL Overall ADL's : Needs assistance/impaired ?Eating/Feeding: Set up;Sitting ?Eating/Feeding Details (indicate cue type and reason): prepared tray for patient to feed herself in recilner ?Grooming: Set up;Bed level ?Grooming Details (indicate cue type and reason): washed her face in supine to improve alertness ?  ?  ?  ?  ?  ?  ?  ?  ?  ?  ?  ?  ?  ?  ?  ?  ?  ? ?Extremity/Trunk Assessment   ?  ?  ?  ?  ?  ? ?Vision Baseline Vision/History: 1 Wears glasses ?  ?  ?Perception   ?  ?Praxis   ?  ? ?Cognition Arousal/Alertness: Awake/alert ?Behavior During Therapy: Flat affect ?Overall Cognitive  Status: Within Functional Limits for tasks assessed ?  ?  ?  ?  ?  ?  ?  ?  ?  ?  ?  ?  ?  ?  ?  ?  ?  ?  ?  ?   ?Exercises   ? ?  ?Shoulder Instructions   ? ? ?  ?General Comments    ? ? ?Pertinent Vitals/ Pain       Pain Assessment ?Pain Assessment: No/denies pain ? ?Home Living   ?  ?  ?  ?  ?  ?  ?  ?  ?  ?  ?  ?  ?  ?  ?  ?  ?  ?  ? ?  ?Prior Functioning/Environment    ?  ?  ?  ?   ? ?Frequency ? Min  2X/week  ? ? ? ? ?  ?Progress Toward Goals ? ?OT Goals(current goals can now be found in the care plan section) ?   ? ?Acute Rehab OT Goals ?Patient Stated Goal: get stronger ?OT Goal Formulation: With patient ?Time For Goal Achievement: 08/06/21 ?Potential to Achieve Goals: Good  ?Plan Discharge plan remains appropriate   ? ?Co-evaluation ? ? ?   ?  ?  ?  ?  ? ?  ?AM-PAC OT "6 Clicks" Daily Activity     ?Outcome Measure ? ? Help from another person eating meals?: A Little ?Help from another person taking care of personal grooming?: A Little ?Help from another person toileting, which includes using toliet, bedpan, or urinal?: A Lot ?Help from another person bathing (including washing, rinsing, drying)?: A Lot ?Help from another person to put on and taking off regular upper body clothing?: A Little ?Help from another person to put on and taking off regular lower body clothing?: Total ?6 Click Score: 14 ? ?  ?End of Session Equipment Utilized During Treatment: Oxygen ? ?OT Visit Diagnosis: Unsteadiness on feet (R26.81);Other abnormalities of gait and mobility (R26.89);Muscle weakness (generalized) (M62.81) ?  ?Activity Tolerance Patient tolerated treatment well ?  ?Patient Left in chair;with call bell/phone within reach ?  ?Nurse Communication Mobility status ?  ? ?   ? ?Time: 0940-1001 ?OT Time Calculation (min): 21 min ? ?Charges: OT General Charges ?$OT Visit: 1 Visit ?OT Treatments ?$Therapeutic Activity: 8-22 mins ? ?Carisa Backhaus, OTR/L ?Acute Care Rehab Services  ?Office [phone] ?Pager: [phone]  ? ?Icesis Renn L Thomasene Dubow ?07/29/2021, 1:21 PM ?

## 2021-07-29 NOTE — Progress Notes (Signed)
Physical Therapy Treatment ?Patient Details ?Name: Krystal Henderson ?MRN: [id number] ?DOB: December 06, 1932 ?Today's Date: 07/29/2021 ? ? ?History of Present Illness 88 y.o. female with medical history significant of  Diastolic, Systolic HF Ef 24-26 %, hypertension, chronic low back pain, compression fracture, GERD, B12 deficiency, who presents with generalized weakness, inability to stand up from her recliner. Dx of CHF, CAP ? ?  ?PT Comments  ? ?   Patient required mod assist of  2 to stand and pivot  back to bed, cues  to step with each leg. Continue PT for mobility/   ?Recommendations for follow up therapy are one component of a multi-disciplinary discharge planning process, led by the attending physician.  Recommendations may be updated based on patient status, additional functional criteria and insurance authorization. ? ?Follow Up Recommendations ? Skilled nursing-short term rehab (<3 hours/day) ?  ?  ?Assistance Recommended at Discharge Frequent or constant Supervision/Assistance  ?Patient can return home with the following A lot of help with walking and/or transfers;A lot of help with bathing/dressing/bathroom;Direct supervision/assist for medications management;Assist for transportation;Help with stairs or ramp for entrance ?  ?Equipment Recommendations ? None recommended by PT  ?  ?Recommendations for Other Services   ? ? ?  ?Precautions / Restrictions Precautions ?Precautions: Fall ?Precaution Comments: Monitor BP-orthostatic; spouse reports 1 fall when getting out of bathtub 3 months ago (no other falls in 6 months) ?Restrictions ?Weight Bearing Restrictions: No  ?  ? ?Mobility ? Bed Mobility ?Overal bed mobility: Needs Assistance ?Bed Mobility: Sit to Supine ?  ?  ?  ?Sit to supine: Max assist, +2 for safety/equipment, +2 for physical assistance ?  ?General bed mobility comments: assist with legs and trunk, patient with decreased  FC at times. ?  ? ?Transfers ?  ?  ?Transfers: Sit to/from Stand, Bed to  chair/wheelchair/BSC ?Sit to Stand: Mod assist, +2 safety/equipment ?  ?Step pivot transfers: Mod assist, +2 safety/equipment, +2 physical assistance ?  ?  ?  ?General transfer comment: mod assist to stand from recliner, multimodal cues and  tactile cues to step with each leg to move to  bed. ?  ? ?Ambulation/Gait ?  ?  ?  ?  ?  ?  ?  ?  ? ? ?Stairs ?  ?  ?  ?  ?  ? ? ?Wheelchair Mobility ?  ? ?Modified Rankin (Stroke Patients Only) ?  ? ? ?  ?Balance Overall balance assessment: Needs assistance ?Sitting-balance support: Single extremity supported ?Sitting balance-Leahy Scale: Poor ?  ?  ?Standing balance support: During functional activity, Reliant on assistive device for balance, Bilateral upper extremity supported ?Standing balance-Leahy Scale: Poor ?  ?  ?  ?  ?  ?  ?  ?  ?  ?  ?  ?  ?  ? ?  ?Cognition Arousal/Alertness: Awake/alert ?Behavior During Therapy: Flat affect ?Overall Cognitive Status: Within Functional Limits for tasks assessed ?Area of Impairment: Following commands ?  ?  ?  ?  ?  ?  ?  ?  ?  ?  ?  ?Following Commands: Follows one step commands with increased time ?  ?  ?  ?General Comments: frequent cues to stay on task, there were multiple persons in room talking and disrtacting ?  ?  ? ?  ?Exercises   ? ?  ?General Comments   ?  ?  ? ?Pertinent Vitals/Pain Pain Assessment ?Faces Pain Scale: Hurts even more ?Pain Location: feet ?Pain Descriptors / Indicators:  Grimacing, Discomfort, Sore ?Pain Intervention(s): Limited activity within patient's tolerance  ? ? ?Home Living   ?  ?  ?  ?  ?  ?  ?  ?  ?  ?   ?  ?Prior Function    ?  ?  ?   ? ?PT Goals (current goals can now be found in the care plan section) Progress towards PT goals: Progressing toward goals ? ?  ?Frequency ? ? ? Min 2X/week ? ? ? ?  ?PT Plan Current plan remains appropriate  ? ? ?Co-evaluation   ?  ?  ?  ?  ? ?  ?AM-PAC PT "6 Clicks" Mobility   ?Outcome Measure ? Help needed turning from your back to your side while in a flat bed  without using bedrails?: Total ?Help needed moving from lying on your back to sitting on the side of a flat bed without using bedrails?: Total ?Help needed moving to and from a bed to a chair (including a wheelchair)?: A Lot ?Help needed standing up from a chair using your arms (e.g., wheelchair or bedside chair)?: A Lot ?Help needed to walk in hospital room?: Total ?Help needed climbing 3-5 steps with a railing? : Total ?6 Click Score: 8 ? ?  ?End of Session   ?Activity Tolerance: Patient limited by fatigue ?Patient left: in bed;with call bell/phone within reach;with nursing/sitter in room ?Nurse Communication: Mobility status ?PT Visit Diagnosis: Difficulty in walking, not elsewhere classified (R26.2);History of falling (Z91.81) ?Pain - part of body: Ankle and joints of foot ?  ? ? ?Time: 1438-8875 ?PT Time Calculation (min) (ACUTE ONLY): 12 min ? ?Charges:  $Therapeutic Activity: 8-22 mins          ?          ? ?Tresa Endo PT ?Acute Rehabilitation Services ?Pager [phone] ?Office [phone] ? ? ? ?Han Vejar, Shella Maxim ?07/29/2021, 2:39 PM ? ?

## 2021-07-29 NOTE — Progress Notes (Signed)
[facility] ?NEPHROLOGY PROGRESS NOTE ? ?Assessment/ Plan: ?Pt is a 88 y.o. yo female with with history of hypertension, CHF, CKD stage IV admitted with fluid overload/shortness of breath, seen as a consultation for AKI on CKD. ? ?#Acute kidney injury on CKD stage IV-b/l creatinine 2.1- 2.5 from 2022, egfr 17 - 39m/min.  Creat 3.5 on admit here in setting of marked volume overload, pulm edema, possible pna. AKI likely due to decompensated CHF.  Responded with IV diuretics with -13 L since admission.  The volume/edema is much better.  We held diuretics yesterday and creatinine level is downtrending.  BUN is still elevated.  Since her volume status acceptable, I will continue to hold diuretics today.  If her renal function remains stable by tomorrow we will plan to resume oral Lasix. ? Continue with strict ins and outs, daily lab. ? ?#Acute CHF exacerbation/fluid overload: Volume management with diuretics as discussed above. ? ?# HTN: Blood pressure soft therefore continue to hold amlodipine and diltiazem dose was reduced.  Diuretics as above. ? ?#Community-acquired pneumonia: Currently on Doxy and ceftriaxone per primary team.  Oxygen requirement is coming down. ? ?#Hypokalemia: Due to diuretics.  Replete oral potassium chloride. ? ?#Somnolent: Patient is sleeping in the morning.  She opens her eyes but not much response.  Not sure what is her baseline mental status.  I will talk to the primary team, ?check ABG. ? ?Subjective: Seen and examined the patient at bedside.  Urine output is around 1.9 L in 24 hours.  No new event.  She was sleeping however was able to open eyes. ?Objective ?Vital signs in last 24 hours: ?Vitals:  ? 07/28/21 2050 07/28/21 2333 07/29/21 0500 07/29/21 0528  ?BP: (!) 101/40 (!) 109/47  (!) 103/47  ?Pulse: 67 74  73  ?Resp: '16 16  20  ' ?Temp: (!) 97.5 ?F (36.4 ?C)   97.6 ?F (36.4 ?C)  ?TempSrc: Oral   Oral  ?SpO2: 99% 99%  97%  ?Weight:   77.1 kg   ?Height:      ? ?Weight change:   ? ?Intake/Output Summary (Last 24 hours) at 07/29/2021 0819 ?Last data filed at 07/29/2021 0831 010 3586?Gross per 24 hour  ?Intake 1513.16 ml  ?Output 1900 ml  ?Net -386.84 ml  ? ? ? ? ? ? ?Labs: ?Basic Metabolic Panel: ?Recent Labs  ?Lab 07/27/21 ?0316 07/28/21 ?0358 07/29/21 ?0411  ?NA 136 137 135  ?K 4.0 3.5 3.3*  ?CL 89* 87* 87*  ?CO2 32 36* 34*  ?GLUCOSE 82 90 93  ?BUN 58* 68* 77*  ?CREATININE 3.37* 3.45* 3.27*  ?CALCIUM 9.4 9.3 9.0  ?PHOS 6.6* 6.4* 7.3*  ? ? ?Liver Function Tests: ?Recent Labs  ?Lab 07/23/21 ?0615 07/24/21 ?0791504/24/23 ?0056904/25/23 ?0794804/26/23 ?0411  ?AST 20  --   --   --   --   ?ALT 15  --   --   --   --   ?ALKPHOS 47  --   --   --   --   ?BILITOT 1.1  --   --   --   --   ?PROT 5.9*  --   --   --   --   ?ALBUMIN 2.9*   < > 3.3* 3.1* 3.1*  ? < > = values in this interval not displayed.  ? ? ?No results for input(s): LIPASE, AMYLASE in the last 168 hours. ?No results for input(s): AMMONIA in the last 168 hours. ?CBC: ?Recent Labs  ?Lab  07/22/21 ?1409 07/23/21 ?0615 07/24/21 ?5449 07/26/21 ?2010 07/27/21 ?0712 07/29/21 ?0411  ?WBC 5.6 5.1 6.1 6.3 6.2 6.2  ?NEUTROABS 3.2  --   --   --   --   --   ?HGB 8.9* 8.0* 8.0* 9.1* 9.7* 8.9*  ?HCT 27.7* 24.8* 25.0* 28.7* 29.9* 27.6*  ?MCV 88.5 88.9 88.7 87.5 86.9 86.8  ?PLT 317 304 292 305 311 302  ? ? ?Cardiac Enzymes: ?No results for input(s): CKTOTAL, CKMB, CKMBINDEX, TROPONINI in the last 168 hours. ?CBG: ?No results for input(s): GLUCAP in the last 168 hours. ? ?Iron Studies: No results for input(s): IRON, TIBC, TRANSFERRIN, FERRITIN in the last 72 hours. ?Studies/Results: ?No results found. ? ?Medications: ?Infusions: ? sodium chloride    ? ? ?Scheduled Medications: ? apixaban  2.5 mg Oral BID  ? vitamin C  1,000 mg Oral Daily  ? Chlorhexidine Gluconate Cloth  6 each Topical Daily  ? cholecalciferol  2,000 Units Oral Daily  ? diltiazem  30 mg Oral Q12H  ? docusate sodium  100 mg Oral BID  ? pantoprazole  40 mg Oral BID  ? polyethylene glycol  17 g Oral  Daily  ? potassium chloride  40 mEq Oral Once  ? pregabalin  75 mg Oral QHS  ? senna  1 tablet Oral BID  ? sodium chloride flush  3 mL Intravenous Q12H  ? ? have reviewed scheduled and prn medications. ? ?Physical Exam: ?General: Elderly female, lying on bed comfortable, sleeping but responding with her name. ?Heart:RRR, s1s2 nl ?Lungs: Clear anteriorly, no increased work of breathing ?Abdomen:soft, Non-tender, non-distended ?Extremities: Only trace edema present in legs ?Neurology: Comfortable and sleeping. ? ?[place] ?07/29/2021,8:19 AM ? LOS: 6 days  ? ?

## 2021-07-29 NOTE — Progress Notes (Signed)
?PROGRESS NOTE ? ? ? ?Krystal Henderson  XIP:[id number] DOB: 1933-01-19 DOA: 07/22/2021 ?PCP: Ann Held, DO  ? ? ? ?Brief Narrative:  ?88 year old with past medical history significant for diastolic and systolic heart failure ejection fraction 45 to 50%, hypertension, chronic low back pain, compression fracture, B12 deficiency GERD who presents with generalized weakness, inability to ambulate.  She also complains of dry cough, shortness of breath, lower extremity edema. ? ?Patient admitted with acute on chronic heart failure exacerbation and AKI on CKD stage IV. She was diuresed. Lasix on hold due to increasing cr. Plan to monitor of Diuretics. She will need SNF at discharge.  ? ? ?Subjective: ? ?She  is sitting up in chair, she appears very weak, reports butt hurt, no bm for the last several days, foley in place with clear urine ?She denies chest pain, no sob, she is on room air, lower extremity edema has almost resolved ?Husband at bedside ? ? ?Assessment & Plan: ? Principal Problem: ?  Acute exacerbation of CHF (congestive heart failure) ([place]) ?Active Problems: ?  HTN (hypertension) ?  GERD (gastroesophageal reflux disease) ?  DJD (degenerative joint disease) ?  Vitamin B12 deficiency ?  Peripheral neuropathy ?  Normocytic anemia ?  CAP (community acquired pneumonia) ?  Paroxysmal atrial fibrillation (HCC) ?  Generalized weakness ?  CKD (chronic kidney disease) stage 4, GFR 15-29 ml/min (HCC) ?  Headache ?  Leg wound, right ?  CHF (congestive heart failure) ([place]) ? ? ? ?Assessment and Plan: ? ?* Acute exacerbation of CHF (congestive heart failure) ([place]) ?Patient presents with cough, SOB, elevation BNP, weakness, LE edema. Chest x ray with HF finding.  ?ECHO; Ef 55 % ?Weight on admission 160 pounds--184--180-178-170 ?Cardiology consulted and sign off.  ?Good urine out put. ?Received 3 days of  Metolazone.  ?Holding lasix  due to increase in Cr. Per nephrology she will need higher dose of lasix at  discharge.  ?Not able to rule out underline pneumonia, though procalcitonin was not significantly elevated, she finished total of 7 days abx treatment with rocephin and doxycycline while in the hospital ? ? ? ?Blood pressure low normal ?Add holding parameters to cardizem ?Continue hold home meds norvasc and cozaar ? ? ?CKD (chronic kidney disease) stage 4, GFR 15-29 ml/min (HCC) ?AKI on CKD stage IV, concern for cardiorenal syndrome.  ?Cr per records 2.5---2.1 ?Cr on admission 3.5--3.0--3.1-3.2 ?Nephrology consulted to assist with diuresis.  ?Renal US no hydronephrosis.  ?Continue to have good urine out put.  ?Holding diuretics, monitor Cr.  ?Appreciate nephrology assistance.  ? ?Hypokalemia ?Replace K, recheck in the morning ? ? ?Paroxysmal atrial fibrillation (HCC) ?In A-fib, rate controlled ?On diltiazem dose reduce. Not able to tolerate betablocker due to fatigue per cards note in the past ?GI, recommend to resume eliquis and monitor.  ?Hb has remain stable.  ? ? ?Normocytic anemia ?Hb trending down, hb one moth ago at 10.  ?Hb on admission 8.9---8.0.--9 ?Iron: 17 T sat 10. Ferritin 21/ will discussed with nephrology in regards aranesp/Iron. ?GI consulted, and recommend to monitor patient on eliquis.  ?Per gi note from 4/21 "Hold off on any endoscopic procedures d/t advanced age and multiple comorbidities.  Besides, she did refuse as well. Can always reconsider if she has any active bleeding.  Please call if there is any change in clinical status." ?Hb stable ? ?GERD (gastroesophageal reflux disease) ?Continue with PPI.  ? ?Peripheral neuropathy ?Continue with lyrica.  ? ?Vitamin B12 deficiency ?  On supplement.  ? ?DJD (degenerative joint disease) ?Vicodin  for chronic back pain and headaches.  ? ?Leg wound, right ?She has Right LE wound after excision of basal cell cancer 2 weeks ago.  ?She has some redness. Her dermatologist prescribe Doxycycline for 7 days , finished abx treatment while in the hospital ?Blood  culture no growth to date.  ? ?Headache ?CT head negative for acute finding.  ?Her pain is more scalp pain, irritation.  ?Needs to follow up with dermatologist  ? ? ?Generalized weakness ?A.m. cortisol level unremarkable ?Suspect deconditioning, als related to HF, LE edema.  ?PT /OT consult recommend SNF ? ? ?Constipation ?Increase MiraLAX, continue Senokot, add suppository ? ?  ?  ?   ? ? ?I have Reviewed nursing notes, Vitals, pain scores, I/o's, Lab results and  imaging results since pt's last encounter, details please see discussion above  ?I ordered the following labs:  ?Unresulted Labs (From admission, onward)  ? ?  Start     Ordered  ? 07/30/21 0500  Renal function panel  Tomorrow morning,   R       ?Question:  Specimen collection method  Answer:  Lab=Lab collect  ? 07/29/21 1524  ? 07/23/21 1336  Occult blood card to lab, stool  Daily,   R     ? 07/23/21 1333  ? 07/23/21 1102  Occult blood card to lab, stool  Once,   R       ? 07/23/21 1101  ? 07/22/21 1357  Differential  (Stroke Panel (PNL))  ONCE - STAT,   URGENT       ? 07/22/21 1357  ? ?  ?  ? ?  ? ? ? ?DVT prophylaxis: apixaban (ELIQUIS) tablet 2.5 mg Start: 07/24/21 2200 ?apixaban (ELIQUIS) tablet 2.5 mg  ? ?Code Status:   Code Status: DNR ? ?Family Communication: Husband at bedside ?Disposition:  ? ?Status is: Inpatient ?  ? ?Dispo: The patient is from: home ?             Anticipated d/c is to: SNF , with palliative care ?             Anticipated d/c date is: TBD, needs nephrology clearance  ? ?Antimicrobials:   ? ?Anti-infectives (From admission, onward)  ? ? Start     Dose/Rate Route Frequency Ordered Stop  ? 07/22/21 1700  cefTRIAXone (ROCEPHIN) 2 g in sodium chloride 0.9 % 100 mL IVPB       ? 2 g ?200 mL/hr over 30 Minutes Intravenous Every 24 hours 07/22/21 1651 07/28/21 1846  ? 07/22/21 1700  doxycycline (VIBRAMYCIN) 100 mg in sodium chloride 0.9 % 250 mL IVPB       ? 100 mg ?125 mL/hr over 120 Minutes Intravenous Every 12 hours 07/22/21 1651  07/29/21 0739  ? ?  ? ? ? ? ? ?Objective: ?Vitals:  ? 07/28/21 2333 07/29/21 0500 07/29/21 0528 07/29/21 1420  ?BP: (!) 109/47  (!) 103/47 (!) 99/51  ?Pulse: 74  73 73  ?Resp: '16  20 18  '$ ?Temp:   97.6 ?F (36.4 ?C) 98 ?F (36.7 ?C)  ?TempSrc:   Oral   ?SpO2: 99%  97% 93%  ?Weight:  77.1 kg    ?Height:      ? ? ?Intake/Output Summary (Last 24 hours) at 07/29/2021 1619 ?Last data filed at 07/29/2021 1500 ?Gross per 24 hour  ?Intake 1153.16 ml  ?Output 1900 ml  ?Net -746.84 ml  ? ?Filed Weights  ?  07/26/21 0500 07/27/21 0500 07/29/21 0500  ?Weight: 81.9 kg 81 kg 77.1 kg  ? ? ?Examination: ? ?General exam: she appears very weak and deconditioned , she does not know she is at [facility] long, she knows she is at a [place], she is oriented to time and person ?Respiratory system:  diminished at bases, poor respiratory effort, no wheezing, no rales, no rhonchi. ?Cardiovascular system:  IRRR.  ?Gastrointestinal system: Abdomen is nondistended, soft and nontender.  Normal bowel sounds heard. ?Central nervous system: Alert and orientedx2-3. No focal neurological deficits. ?Extremities:  no edema ?Skin: No rashes, lesions or ulcers ?Psychiatry:  appear very tired, calm and cooperative ? ? ? ?Data Reviewed: I have personally reviewed  labs and visualized  imaging studies since the last encounter and formulate the plan  ? ? ? ? ? ? ?Scheduled Meds: ? apixaban  2.5 mg Oral BID  ? vitamin C  1,000 mg Oral Daily  ? Chlorhexidine Gluconate Cloth  6 each Topical Daily  ? cholecalciferol  2,000 Units Oral Daily  ? diltiazem  30 mg Oral Q12H  ? docusate sodium  100 mg Oral BID  ? pantoprazole  40 mg Oral BID  ? polyethylene glycol  17 g Oral BID  ? pregabalin  75 mg Oral QHS  ? senna  1 tablet Oral BID  ? sodium chloride flush  3 mL Intravenous Q12H  ? ?Continuous Infusions: ? sodium chloride    ? ? ? LOS: 6 days  ? ? ? ? ?Florencia Reasons, MD PhD FACP ?[facility] ? ?Available via Epic secure chat 7am-7pm for nonurgent issues ?Please page  for urgent issues ?To page the attending provider between 7A-7P or the covering provider during after hours 7P-7A, please log into the web site [email or web address] and access using universal [place] password for

## 2021-07-30 LAB — RENAL FUNCTION PANEL
Albumin: 3.1 g/dL — ABNORMAL LOW (ref 3.5–5.0)
Anion gap: 11 (ref 5–15)
BUN: 76 mg/dL — ABNORMAL HIGH (ref 8–23)
CO2: 33 mmol/L — ABNORMAL HIGH (ref 22–32)
Calcium: 9.2 mg/dL (ref 8.9–10.3)
Chloride: 92 mmol/L — ABNORMAL LOW (ref 98–111)
Creatinine, Ser: 3.11 mg/dL — ABNORMAL HIGH (ref 0.44–1.00)
GFR, Estimated: 14 mL/min — ABNORMAL LOW (ref >60)
Glucose, Bld: 93 mg/dL (ref 70–99)
Phosphorus: 5.8 mg/dL — ABNORMAL HIGH (ref 2.5–4.6)
Potassium: 3.6 mmol/L (ref 3.5–5.1)
Sodium: 136 mmol/L (ref 135–145)

## 2021-07-30 MED ORDER — HYDROCORTISONE ACETATE 25 MG RE SUPP
25.0000 mg | Freq: Two times a day (BID) | RECTAL | Status: AC
  Administered 2021-07-30 – 2021-07-31 (×2): 25 mg via RECTAL
  Filled 2021-07-30 (×2): qty 1

## 2021-07-30 MED ORDER — FUROSEMIDE 40 MG PO TABS
40.0000 mg | ORAL_TABLET | Freq: Every day | ORAL | Status: DC
  Administered 2021-07-31 – 2021-08-01 (×2): 40 mg via ORAL
  Filled 2021-07-30 (×2): qty 1

## 2021-07-30 MED ORDER — DARBEPOETIN ALFA 40 MCG/0.4ML IJ SOSY
40.0000 ug | PREFILLED_SYRINGE | Freq: Once | INTRAMUSCULAR | Status: AC
  Administered 2021-07-30: 40 ug via SUBCUTANEOUS
  Filled 2021-07-30: qty 0.4

## 2021-07-30 MED ORDER — SODIUM CHLORIDE 0.9 % IV SOLN
250.0000 mg | Freq: Once | INTRAVENOUS | Status: AC
  Administered 2021-07-30: 250 mg via INTRAVENOUS
  Filled 2021-07-30: qty 20

## 2021-07-30 NOTE — Progress Notes (Addendum)
?PROGRESS NOTE ? ? ? ?Krystal Henderson  XBM:[id number] DOB: 05/10/1932 DOA: 07/22/2021 ?PCP: Ann Held, DO  ? ? ? ?Brief Narrative:  ?88 year old with past medical history significant for diastolic and systolic heart failure ejection fraction 45 to 50%, hypertension, chronic low back pain, compression fracture, B12 deficiency GERD who presents with generalized weakness, inability to ambulate.  She also complains of dry cough, shortness of breath, lower extremity edema. ? ?Patient admitted with acute on chronic heart failure exacerbation and AKI on CKD stage IV. She was diuresed. Lasix on hold due to increasing cr. Plan to monitor of Diuretics. She will need SNF at discharge.  ? ? ?Subjective: ? ?She  is sitting up in chair, c/o butt hurt, has no bm  ?she appears very weak, reports butt hurt, no bm for the last several days, foley in place with clear urine ?She denies chest pain, no sob, she is on room air, lower extremity edema has almost resolved ?Husband at bedside ? ? ?Assessment & Plan: ? Principal Problem: ?  Acute exacerbation of CHF (congestive heart failure) ([place]) ?Active Problems: ?  HTN (hypertension) ?  GERD (gastroesophageal reflux disease) ?  DJD (degenerative joint disease) ?  Vitamin B12 deficiency ?  Peripheral neuropathy ?  Normocytic anemia ?  CAP (community acquired pneumonia) ?  Paroxysmal atrial fibrillation (HCC) ?  Generalized weakness ?  CKD (chronic kidney disease) stage 4, GFR 15-29 ml/min (HCC) ?  Headache ?  Leg wound, right ?  CHF (congestive heart failure) ([place]) ? ? ? ?Assessment and Plan: ? ?* Acute exacerbation of CHF (congestive heart failure) ([place]) ?Patient presents with cough, SOB, elevation BNP, weakness, LE edema. Chest x ray with HF finding.  ?ECHO; Ef 55 % ?Improving, now on room air, edema down, weight down ?Cardiology consulted and sign off.  ?Holding lasix  due to increase in Cr. Per nephrology she will need higher dose of lasix at discharge.  ?Not able to rule out  underline pneumonia, though procalcitonin was not significantly elevated, she finished total of 7 days abx treatment with rocephin and doxycycline while in the hospital ? ? ? ?Blood pressure low normal ?Add holding parameters to cardizem ?Continue hold home meds norvasc and cozaar ? ? ? ?AKI on CKD stage IV, concern for cardiorenal syndrome.  ?Cr per records 2.5---2.1 ?Cr on admission 3.5--3.0--3.1-3.2 ?Renal US no hydronephrosis.  ?Continue to have good urine out put.  ?Holding diuretics, monitor Cr.  ?Appreciate nephrology assistance.  ? ?Hypokalemia ?Replaced K, recheck in the morning ? ? ?Paroxysmal atrial fibrillation (HCC) ?In A-fib, rate controlled ?On diltiazem dose reduce. Not able to tolerate betablocker due to fatigue per cards note in the past ?GI, recommend to resume eliquis and monitor.  ?Hgb has remain stable.  ? ? ?Normocytic anemia ?Hb trending down, hb one moth ago at 10.  ?Hb on admission 8.9---8.0.--9 ?Iron: 14 T sat 10. Ferritin 21/ will discussed with nephrology in regards aranesp/Iron. ?GI consulted, and recommend to monitor patient on eliquis.  ?Per gi note from 4/21 "Hold off on any endoscopic procedures d/t advanced age and multiple comorbidities.  Besides, she did refuse as well. Can always reconsider if she has any active bleeding.  Please call if there is any change in clinical status." ?Hb stable ? iron on 4/21 and 4/17,aranesp per nephro ? ?GERD (gastroesophageal reflux disease) ?Continue with PPI.  ? ?Peripheral neuropathy ?Continue with lyrica.  ? ?Vitamin B12 deficiency ?On supplement.  ? ?DJD (degenerative joint disease) ?  Vicodin  for chronic back pain and headaches.  ? ?Leg wound, right ?She has Right LE wound after excision of basal cell cancer 2 weeks ago.  ?She has some redness. Her dermatologist prescribe Doxycycline for 7 days , finished abx treatment while in the hospital ?Blood culture no growth to date.  ? ?Headache ?CT head negative for acute finding.  ?Her pain is more  scalp pain, irritation.  ?Needs to follow up with dermatologist  ? ? ?Generalized weakness ?A.m. cortisol level unremarkable ?Suspect deconditioning, als related to HF, LE edema.  ?PT /OT consult recommend SNF ? ? ?Constipation ?Increase MiraLAX, continue Senokot, add suppository ? ? ?I have Reviewed nursing notes, Vitals, pain scores, I/o's, Lab results and  imaging results since pt's last encounter, details please see discussion above  ?I ordered the following labs:  ?Unresulted Labs (From admission, onward)  ? ?  Start     Ordered  ? 07/31/21 0500  Renal function panel  Tomorrow morning,   R       ?Question:  Specimen collection method  Answer:  Lab=Lab collect  ? 07/30/21 1117  ? 07/23/21 1336  Occult blood card to lab, stool  Daily,   R     ? 07/23/21 1333  ? 07/22/21 1357  Differential  (Stroke Panel (PNL))  ONCE - STAT,   URGENT       ? 07/22/21 1357  ? ?  ?  ? ?  ? ? ? ?DVT prophylaxis: apixaban (ELIQUIS) tablet 2.5 mg Start: 07/24/21 2200 ?apixaban (ELIQUIS) tablet 2.5 mg  ? ?Code Status:   Code Status: DNR ? ?Family Communication: Husband at bedside ?Disposition:  ? ?Status is: Inpatient ?  ? ?Dispo: The patient is from: home ?             Anticipated d/c is to: SNF , with palliative care ?             Anticipated d/c date is: TBD, needs nephrology clearance  ? ?Antimicrobials:   ? ?Anti-infectives (From admission, onward)  ? ? Start     Dose/Rate Route Frequency Ordered Stop  ? 07/22/21 1700  cefTRIAXone (ROCEPHIN) 2 g in sodium chloride 0.9 % 100 mL IVPB       ? 2 g ?200 mL/hr over 30 Minutes Intravenous Every 24 hours 07/22/21 1651 07/28/21 1846  ? 07/22/21 1700  doxycycline (VIBRAMYCIN) 100 mg in sodium chloride 0.9 % 250 mL IVPB       ? 100 mg ?125 mL/hr over 120 Minutes Intravenous Every 12 hours 07/22/21 1651 07/29/21 0739  ? ?  ? ? ? ? ? ?Objective: ?Vitals:  ? 07/29/21 0528 07/29/21 1420 07/30/21 0500 07/30/21 0617  ?BP: (!) 103/47 (!) 99/51  (!) 108/44  ?Pulse: 73 73  81  ?Resp: '20 18  20  '$ ?Temp:  97.6 ?F (36.4 ?C) 98 ?F (36.7 ?C)  97.6 ?F (36.4 ?C)  ?TempSrc: Oral   Oral  ?SpO2: 97% 93%  95%  ?Weight:   77.6 kg   ?Height:      ? ? ?Intake/Output Summary (Last 24 hours) at 07/30/2021 1337 ?Last data filed at 07/30/2021 1751 ?Gross per 24 hour  ?Intake 180 ml  ?Output 1600 ml  ?Net -1420 ml  ? ?Filed Weights  ? 07/27/21 0500 07/29/21 0500 07/30/21 0500  ?Weight: 81 kg 77.1 kg 77.6 kg  ? ? ?Examination: ? ?General exam: she appears very weak and deconditioned , she does not know she is at [employer]  long, she knows she is at a [place], she is oriented to time and person ?Respiratory system:  diminished at bases, poor respiratory effort, no wheezing, no rales, no rhonchi. ?Cardiovascular system:  IRRR.  ?Gastrointestinal system: Abdomen is nondistended, soft and nontender.  Normal bowel sounds heard. ?Central nervous system: Alert and orientedx2-3. No focal neurological deficits. ?Extremities:  no edema ?Skin: No rashes, lesions or ulcers ?Psychiatry:  appear very tired, calm and cooperative ? ? ? ?Data Reviewed: I have personally reviewed  labs and visualized  imaging studies since the last encounter and formulate the plan  ? ? ? ? ? ? ?Scheduled Meds: ? apixaban  2.5 mg Oral BID  ? vitamin C  1,000 mg Oral Daily  ? Chlorhexidine Gluconate Cloth  6 each Topical Daily  ? cholecalciferol  2,000 Units Oral Daily  ? darbepoetin (ARANESP) injection - NON-DIALYSIS  40 mcg Subcutaneous Once  ? diltiazem  30 mg Oral Q12H  ? docusate sodium  100 mg Oral BID  ? [START ON 07/31/2021] furosemide  40 mg Oral Daily  ? pantoprazole  40 mg Oral BID  ? polyethylene glycol  17 g Oral BID  ? pregabalin  75 mg Oral QHS  ? senna  1 tablet Oral BID  ? sodium chloride flush  3 mL Intravenous Q12H  ? ?Continuous Infusions: ? sodium chloride    ? ferric gluconate (FERRLECIT) IVPB 250 mg (07/30/21 1225)  ? ? ? LOS: 7 days  ? ? ? ? ?Florencia Reasons, MD PhD FACP ?[facility] ? ?Available via Epic secure chat 7am-7pm for nonurgent  issues ?Please page for urgent issues ?To page the attending provider between 7A-7P or the covering provider during after hours 7P-7A, please log into the web site [email or web address] and access using universal [facility]

## 2021-07-30 NOTE — Plan of Care (Signed)
?  Problem: Respiratory: ?Goal: Ability to maintain adequate ventilation will improve ?Outcome: Progressing ?Goal: Ability to maintain a clear airway will improve ?Outcome: Progressing ?  ?

## 2021-07-30 NOTE — Progress Notes (Addendum)
[facility] ?NEPHROLOGY PROGRESS NOTE ? ?Assessment/ Plan: ?Pt is a 88 y.o. yo female with with history of hypertension, CHF, CKD stage IV admitted with fluid overload/shortness of breath, seen as a consultation for AKI on CKD. ? ?#Acute kidney injury on CKD stage IV-b/l creatinine 2.1- 2.5 from 2022, egfr 17 - 54m/min.  Creat 3.5 on admit here in setting of marked volume overload, pulm edema, possible pna. AKI likely due to decompensated CHF.  Responded with IV diuretics with -13 L since admission.  The volume/edema is much better.  We are holding diuretics because of rising both BUN and creatinine level.  She is in room air and volume status looks acceptable.  I will resume Lasix starting from tomorrow. ?Continue with strict ins and outs, daily lab. ?[facility] ? ?#Acute CHF exacerbation/fluid overload: Volume management with diuretics as discussed above. ? ?# HTN: Blood pressure soft therefore continue to hold amlodipine and diltiazem dose was reduced.  Diuretics as above. ? ?#Community-acquired pneumonia: Clinically improving, completed antibiotics.  She is currently on room air.   ? ?#Hypokalemia: Due to diuretics.  Replete oral potassium chloride.  Potassium 3.6 today. ? ?#Anemia multifactorial due to GI bleed, CKD, chronic disease: Iron saturation of 10% and patient received a dose of iron on 4/21.  Noted hemoglobin 8.9 on 4/26.  I will order another dose of iron and Aranesp. ? ?#Somnolent: More alert awake and sitting on chair today. ? ?Discussed with the primary team. ? ?Subjective: Seen and examined the patient at bedside.  Urine output is around 1.6 L in 24 hours.  No new event.  Denies nausea, vomiting, chest pain or shortness of breath.  Not on oxygen anymore.   ?Objective ?Vital signs in last 24 hours: ?Vitals:  ? 07/29/21 0528 07/29/21 1420 07/30/21 0500 07/30/21 0617  ?BP: (!) 103/47 (!) 99/51  (!) 108/44  ?Pulse: 73 73  81  ?Resp: '20 18  20   ' ?Temp: 97.6 ?F (36.4 ?C) 98 ?F (36.7 ?C)  97.6 ?F (36.4 ?C)  ?TempSrc: Oral   Oral  ?SpO2: 97% 93%  95%  ?Weight:   77.6 kg   ?Height:      ? ?Weight change: 0.454 kg ? ?Intake/Output Summary (Last 24 hours) at 07/30/2021 1109 ?Last data filed at 07/30/2021 06606?Gross per 24 hour  ?Intake 180 ml  ?Output 1600 ml  ?Net -1420 ml  ? ? ? ? ? ? ?Labs: ?Basic Metabolic Panel: ?Recent Labs  ?Lab 07/28/21 ?0358 07/29/21 ?0411 07/30/21 ?0326  ?NA 137 135 136  ?K 3.5 3.3* 3.6  ?CL 87* 87* 92*  ?CO2 36* 34* 33*  ?GLUCOSE 90 93 93  ?BUN 68* 77* 76*  ?CREATININE 3.45* 3.27* 3.11*  ?CALCIUM 9.3 9.0 9.2  ?PHOS 6.4* 7.3* 5.8*  ? ? ?Liver Function Tests: ?Recent Labs  ?Lab 07/28/21 ?0358 07/29/21 ?0411 07/30/21 ?0326  ?ALBUMIN 3.1* 3.1* 3.1*  ? ? ?No results for input(s): LIPASE, AMYLASE in the last 168 hours. ?No results for input(s): AMMONIA in the last 168 hours. ?CBC: ?Recent Labs  ?Lab 07/24/21 ?0004504/23/23 ?0333 07/27/21 ?0997704/26/23 ?0411  ?WBC 6.1 6.3 6.2 6.2  ?HGB 8.0* 9.1* 9.7* 8.9*  ?HCT 25.0* 28.7* 29.9* 27.6*  ?MCV 88.7 87.5 86.9 86.8  ?PLT 292 305 311 302  ? ? ?Cardiac Enzymes: ?No results for input(s): CKTOTAL, CKMB, CKMBINDEX, TROPONINI in the last 168 hours. ?CBG: ?No results for input(s): GLUCAP in the last 168 hours. ? ?  Iron Studies: No results for input(s): IRON, TIBC, TRANSFERRIN, FERRITIN in the last 72 hours. ?Studies/Results: ?No results found. ? ?Medications: ?Infusions: ? sodium chloride    ? ? ?Scheduled Medications: ? apixaban  2.5 mg Oral BID  ? vitamin C  1,000 mg Oral Daily  ? Chlorhexidine Gluconate Cloth  6 each Topical Daily  ? cholecalciferol  2,000 Units Oral Daily  ? diltiazem  30 mg Oral Q12H  ? docusate sodium  100 mg Oral BID  ? pantoprazole  40 mg Oral BID  ? polyethylene glycol  17 g Oral BID  ? pregabalin  75 mg Oral QHS  ? senna  1 tablet Oral BID  ? sodium chloride flush  3 mL Intravenous Q12H  ? ? have reviewed scheduled and prn medications. ? ?Physical Exam: ?General: Elderly female  sitting on chair, in room air. ?Heart:RRR, s1s2 nl ?Lungs: Clear anteriorly, no increased work of breathing ?Abdomen:soft, Non-tender, non-distended ?Extremities: No edema present. ?Neurology: Alert awake and following commands.. ? ?Krystal Henderson ?07/30/2021,11:09 AM ? LOS: 7 days  ? ?

## 2021-07-30 NOTE — Progress Notes (Addendum)
Occupational Therapy Treatment ?Patient Details ?Name: Krystal Henderson ?MRN: [id number] ?DOB: [birth year] ?Today's Date: 07/30/2021 ? ? ?History of present illness 88 y.o. female with medical history significant of  Diastolic, Systolic HF Ef 92-42 %, hypertension, chronic low back pain, compression fracture, GERD, B12 deficiency, who presents with generalized weakness, inability to stand up from her recliner. Dx of CHF, CAP ?  ?OT comments ? Treatment focused on functional mobility and activity tolerance needed to advance participation in ADLs. Patient reports some dizziness at rest, with sitting and standing but not overly symptomatic. Bps are as follows: ?98/36 supine ?115/60 after exercises in supine ?104/46 sitting ?92/45 after 6 feet of ambulation ?124/46 after recovering in sitting ?92/45 after ambulating 8 feet  ? ?Patient max assist to transfer to edge of bed, needed min assist to steady at edge due to dizziness and fatigue. Min x 2 to rise from bed height and min guard to walk 6 feet with walker with chair follow. Patient required seated rest break and then able to walk another 8 feet into hall. With firm encouragement patient able to make progress today. Monitored BP - patient reports mild dizziness - but otherwise not overly symptomatic. RN notified of blood pressures.    ? ?Recommendations for follow up therapy are one component of a multi-disciplinary discharge planning process, led by the attending physician.  Recommendations may be updated based on patient status, additional functional criteria and insurance authorization. ?   ?Follow Up Recommendations ? Skilled nursing-short term rehab (<3 hours/day)  ?  ?Assistance Recommended at Discharge Frequent or constant Supervision/Assistance  ?Patient can return home with the following ? A lot of help with walking and/or transfers;A lot of help with bathing/dressing/bathroom;Assistance with cooking/housework;Help with stairs or ramp for entrance;Assist for  transportation ?  ?Equipment Recommendations ? Other (comment) (defer)  ?  ?Recommendations for Other Services   ? ?  ?Precautions / Restrictions Precautions ?Precautions: Fall ?Precaution Comments: Monitor BP-orthostatic; ?Restrictions ?Weight Bearing Restrictions: No  ? ? ?  ? ?Mobility Bed Mobility ?Overal bed mobility: Needs Assistance ?Bed Mobility: Supine to Sit ?  ?  ?  ?Sit to supine: Max assist, +2 for safety/equipment, HOB elevated ?  ?General bed mobility comments: Max assist to transfer to edge of bed. ?  ? ?Transfers ?Overall transfer level: Needs assistance ?Equipment used: Rolling walker (2 wheels) ?Transfers: Sit to/from Stand, Bed to chair/wheelchair/BSC ?Sit to Stand: Mod assist, +2 safety/equipment, Min assist ?  ?  ?  ?  ?  ?General transfer comment: Min assist x 2 to stand from elevated bed height but mod assist x 2 to stand from recliner height x 2. Patient able to ambulate six feet and then 8 feet with Rw with chair follow. Patient reports dizziness with activity and orthostatic BPs monitored. ?  ?  ?Balance Overall balance assessment: Needs assistance ?Sitting-balance support: Feet supported, Single extremity supported ?Sitting balance-Leahy Scale: Poor ?Sitting balance - Comments: min assist at edge of bed ?  ?Standing balance support: During functional activity, Reliant on assistive device for balance, Bilateral upper extremity supported ?Standing balance-Leahy Scale: Poor ?  ?  ?  ?  ?  ?  ?  ?  ?  ?  ?  ?  ?   ? ?ADL either performed or assessed with clinical judgement  ? ?ADL   ?  ?  ?  ?  ?  ?  ?  ?  ?  ?  ?  ?  ?  ?  ?  ?  ?  ?  ?  ?  ?  ? ?  Extremity/Trunk Assessment Upper Extremity Assessment ?Upper Extremity Assessment: Generalized weakness ?  ?Lower Extremity Assessment ?Lower Extremity Assessment: Generalized weakness ?  ?  ?  ? ?Vision Baseline Vision/History: 1 Wears glasses ?  ?  ?Perception   ?  ?Praxis   ?  ? ?Cognition Arousal/Alertness: Awake/alert ?Behavior During Therapy:  Flat affect ?Overall Cognitive Status: Within Functional Limits for tasks assessed ?  ?  ?  ?  ?  ?  ?  ?  ?  ?  ?  ?  ?  ?  ?  ?  ?General Comments: Initially asleep. Tooks some time for her to wake up and process at a normal speed. ?  ?  ?   ?Exercises   ? ?  ?Shoulder Instructions   ? ? ?  ?General Comments    ? ? ?Pertinent Vitals/ Pain       Pain Assessment ?Pain Assessment: Faces ?Faces Pain Scale: Hurts a little bit ?Pain Location: LEs ?Pain Descriptors / Indicators: Grimacing ?Pain Intervention(s): Monitored during session ? ?Home Living   ?  ?  ?  ?  ?  ?  ?  ?  ?  ?  ?  ?  ?  ?  ?  ?  ?  ?  ? ?  ?Prior Functioning/Environment    ?  ?  ?  ?   ? ?Frequency ? Min 2X/week  ? ? ? ? ?  ?Progress Toward Goals ? ?OT Goals(current goals can now be found in the care plan section) ? Progress towards OT goals: Progressing toward goals ? ?Acute Rehab OT Goals ?Patient Stated Goal: get stronger ?OT Goal Formulation: With patient ?Time For Goal Achievement: 08/06/21 ?Potential to Achieve Goals: Good  ?Plan Discharge plan remains appropriate   ? ?Co-evaluation ? ? ?   ?  ?  ?  ?  ? ?  ?AM-PAC OT "6 Clicks" Daily Activity     ?Outcome Measure ? ? Help from another person eating meals?: A Little ?Help from another person taking care of personal grooming?: A Little ?Help from another person toileting, which includes using toliet, bedpan, or urinal?: A Lot ?Help from another person bathing (including washing, rinsing, drying)?: A Lot ?Help from another person to put on and taking off regular upper body clothing?: A Little ?Help from another person to put on and taking off regular lower body clothing?: Total ?6 Click Score: 14 ? ?  ?End of Session Equipment Utilized During Treatment: Rolling walker (2 wheels);Gait belt ? ?OT Visit Diagnosis: Unsteadiness on feet (R26.81);Other abnormalities of gait and mobility (R26.89);Muscle weakness (generalized) (M62.81) ?  ?Activity Tolerance Patient tolerated treatment well ?  ?Patient  Left in chair;with call bell/phone within reach ?  ?Nurse Communication Mobility status ?  ? ?   ? ?Time: 9371-6967 ?OT Time Calculation (min): 33 min ? ?Charges: OT General Charges ?$OT Visit: 1 Visit ?OT Treatments ?$Self Care/Home Management : 23-37 mins ? ?Keron Neenan, OTR/L ?Acute Care Rehab Services  ?Office [phone] ?Pager: [phone]  ? ?Lavada Langsam L Georgann Bramble ?07/30/2021, 1:13 PM ?

## 2021-07-31 ENCOUNTER — Inpatient Hospital Stay (HOSPITAL_COMMUNITY): Payer: Medicare Other

## 2021-07-31 LAB — RENAL FUNCTION PANEL
Albumin: 2.9 g/dL — ABNORMAL LOW (ref 3.5–5.0)
Anion gap: 11 (ref 5–15)
BUN: 72 mg/dL — ABNORMAL HIGH (ref 8–23)
CO2: 30 mmol/L (ref 22–32)
Calcium: 9.4 mg/dL (ref 8.9–10.3)
Chloride: 95 mmol/L — ABNORMAL LOW (ref 98–111)
Creatinine, Ser: 2.66 mg/dL — ABNORMAL HIGH (ref 0.44–1.00)
GFR, Estimated: 17 mL/min — ABNORMAL LOW (ref >60)
Glucose, Bld: 84 mg/dL (ref 70–99)
Phosphorus: 4.9 mg/dL — ABNORMAL HIGH (ref 2.5–4.6)
Potassium: 4 mmol/L (ref 3.5–5.1)
Sodium: 136 mmol/L (ref 135–145)

## 2021-07-31 MED ORDER — BISACODYL 10 MG RE SUPP
10.0000 mg | Freq: Every day | RECTAL | Status: DC
  Administered 2021-07-31: 10 mg via RECTAL
  Filled 2021-07-31: qty 1

## 2021-07-31 MED ORDER — SENNOSIDES-DOCUSATE SODIUM 8.6-50 MG PO TABS
1.0000 | ORAL_TABLET | Freq: Two times a day (BID) | ORAL | Status: DC
  Administered 2021-07-31 – 2021-08-01 (×2): 1 via ORAL
  Filled 2021-07-31 (×2): qty 1

## 2021-07-31 MED ORDER — MINERAL OIL RE ENEM
1.0000 | ENEMA | Freq: Once | RECTAL | Status: AC
Start: 2021-07-31 — End: 2021-07-31
  Administered 2021-07-31: 1 via RECTAL
  Filled 2021-07-31 (×2): qty 1

## 2021-07-31 MED ORDER — LACTULOSE 10 GM/15ML PO SOLN
10.0000 g | Freq: Two times a day (BID) | ORAL | Status: DC
  Administered 2021-07-31: 10 g via ORAL
  Filled 2021-07-31: qty 30

## 2021-07-31 MED ORDER — DILTIAZEM HCL 30 MG PO TABS
15.0000 mg | ORAL_TABLET | Freq: Two times a day (BID) | ORAL | Status: DC
Start: 2021-07-31 — End: 2021-08-01
  Administered 2021-07-31 – 2021-08-01 (×2): 15 mg via ORAL
  Filled 2021-07-31 (×2): qty 1

## 2021-07-31 NOTE — TOC Progression Note (Signed)
Transition of Care (TOC) - Progression Note  ? ? ?Patient Details  ?Name: Krystal Henderson ?MRN: [id number] ?Date of Birth: Dec 22, 1932 ? ?Transition of Care (TOC) CM/SW Contact  ?Purcell Mouton, RN ?Phone Number: ?07/31/2021, 3:47 PM ? ?Clinical Narrative:    ? ?Spoke with pt's husband to inform him that insurance approved pt to go to [facility] 4/28-5/2, #[id number]. A call was made to [facility] who states that pt may come in the am.  ? ?Expected Discharge Plan: [place] ?Barriers to Discharge: Continued Medical Work up ? ?Expected Discharge Plan and Services ?Expected Discharge Plan: [place] ?  ?  ?  ?Living arrangements for the past 2 months: [place] ?                ?  ?  ?  ?  ?  ?  ?  ?  ?  ?  ? ? ?Social Determinants of Health (SDOH) Interventions ?  ? ?Readmission Risk Interventions ? ?  11/12/2019  ?  2:07 PM  ?Readmission Risk Prevention Plan  ?Transportation Screening Complete  ?Home Care Screening Complete  ?Medication Review (RN CM) Complete  ? ? ?

## 2021-07-31 NOTE — TOC Progression Note (Addendum)
Transition of Care (TOC) - Progression Note  ? ? ?Patient Details  ?Name: Krystal Henderson ?MRN: [id number] ?Date of Birth: 11-20-1932 ? ?Transition of Care (TOC) CM/SW Contact  ?Purcell Mouton, RN ?Phone Number: ?07/31/2021, 1:51 PM ? ?Clinical Narrative:    ? ?Spoke with pt's husband who agreed with [employer]. Insurance auth started. Need updated PT notes.  ? ?Expected Discharge Plan: [place] ?Barriers to Discharge: Continued Medical Work up ? ?Expected Discharge Plan and Services ?Expected Discharge Plan: [place] ?  ?  ?  ?Living arrangements for the past 2 months: [place] ?                ?  ?  ?  ?  ?  ?  ?  ?  ?  ?  ? ? ?Social Determinants of Health (SDOH) Interventions ?  ? ?Readmission Risk Interventions ? ?  11/12/2019  ?  2:07 PM  ?Readmission Risk Prevention Plan  ?Transportation Screening Complete  ?Home Care Screening Complete  ?Medication Review (RN CM) Complete  ? ? ?

## 2021-07-31 NOTE — Progress Notes (Signed)
?PROGRESS NOTE ? ? ? ?Krystal Henderson  SEG:[id number] DOB: 1933/01/25 DOA: 07/22/2021 ?PCP: Ann Held, DO  ? ? ? ?Brief Narrative:  ?88 year old with past medical history significant for diastolic and systolic heart failure ejection fraction 45 to 50%, hypertension, chronic low back pain, compression fracture, B12 deficiency GERD who presents with generalized weakness, inability to ambulate.  She also complains of dry cough, shortness of breath, lower extremity edema. ? ?Patient admitted with acute on chronic heart failure exacerbation and AKI on CKD stage IV. She was diuresed. Lasix on hold due to increasing cr. Plan to monitor of Diuretics. She will need SNF at discharge.  ? ? ?Subjective: ? ?She  is sitting up in chair, c/o butt hurt, still has no bm , denies ab pain, no n/v ? foley in place with clear urine ?She denies chest pain, no sob, she is on room air, lower extremity edema has resolved ? ? ? ?Assessment & Plan: ? Principal Problem: ?  Acute exacerbation of CHF (congestive heart failure) ([place]) ?Active Problems: ?  HTN (hypertension) ?  GERD (gastroesophageal reflux disease) ?  DJD (degenerative joint disease) ?  Vitamin B12 deficiency ?  Peripheral neuropathy ?  Normocytic anemia ?  CAP (community acquired pneumonia) ?  Paroxysmal atrial fibrillation (HCC) ?  Generalized weakness ?  CKD (chronic kidney disease) stage 4, GFR 15-29 ml/min (HCC) ?  Headache ?  Leg wound, right ?  CHF (congestive heart failure) ([place]) ? ? ? ?Assessment and Plan: ? ?* Acute exacerbation of CHF (congestive heart failure) ([place]) ?Patient presents with cough, SOB, elevation BNP, weakness, LE edema. Chest x ray with HF finding.  ?ECHO; Ef 55 % ?Improving, now on room air, edema down, weight down ?Cardiology consulted and sign off.  ?Holding lasix held for several days due to aki,nephrology recommend  oral lasix '40mg'$  daily from 4/28. ?  ?Not able to rule out underline pneumonia, though procalcitonin was not significantly  elevated, she finished total of 7 days abx treatment with rocephin and doxycycline while in the hospital ? ? ? ?Blood pressure low normal ?Am cortisol 15.9, tsh 3.3 ?Add holding parameters to cardizem ?Continue hold home meds norvasc and cozaar ? ? ? ?AKI on CKD stage IV, concern for cardiorenal syndrome.  ?Cr per records 2.5---2.1 ?Cr  on admission 3.5, cr has improved  ?Renal US no hydronephrosis.  ?Continue to have good urine output.  ?Appreciate nephrology assistance.  ? ?Hypokalemia ?Replaced and normalized  ? ? ?Paroxysmal atrial fibrillation (HCC) ?In A-fib, rate controlled ?On diltiazem dose reduce. Not able to tolerate betablocker due to fatigue per cards note in the past ?GI, recommend to resume eliquis and monitor.  ?Hgb  remains stable.  ? ? ?Normocytic anemia ?-Seen by GI, per GI note from 4/21 "Heme neg stools. H/O occ BRBPR d/t hemorrhoids. She has normocytic anemia Hb 10.7 to 8 (over a month). Neg EGD/colon July 2011"  "Recommend to restart Eliquis and monitor for any GI bleeding" ?"Hold off on any endoscopic procedures d/t advanced age and multiple comorbidities.  Besides, she did refuse as well. Can always reconsider if she has any active bleeding.  Please call if there is any change in clinical status." ?-Hb stable ? -received iron on 4/21 and 4/17,aranesp [place] x1 on 4/27 per nephro ? ?GERD (gastroesophageal reflux disease) ?Continue with PPI.  ? ?Peripheral neuropathy ?Continue with lyrica.  ? ?Vitamin B12 deficiency ?On supplement.  ? ?DJD (degenerative joint disease) ?Vicodin  for chronic back  pain and headaches.  ? ?Leg wound, right ?She has Right LE wound after excision of basal cell cancer 2 weeks ago.  ?She has some redness. Her dermatologist prescribe Doxycycline for 7 days , finished abx treatment while in the hospital ?Blood culture no growth to date.  ? ?Headache ?CT head negative for acute finding.  ?Her pain is more scalp pain, irritation.  ?Needs to follow up with dermatologist   ?Denies headache in the last few days ? ? ?Generalized weakness ?A.m. cortisol level unremarkable ?Suspect deconditioning, als related to HF, LE edema.  ?PT /OT consult recommend SNF ? ? ?Constipation ?Increase MiraLAX, continue Senokot, add suppository ? ? ?I have Reviewed nursing notes, Vitals, pain scores, I/o's, Lab results and  imaging results since pt's last encounter, details please see discussion above  ? ? ? ?DVT prophylaxis: apixaban (ELIQUIS) tablet 2.5 mg Start: 07/24/21 2200 ?apixaban (ELIQUIS) tablet 2.5 mg  ? ?Code Status:   Code Status: DNR ? ?Family Communication: Husband at bedside ?Disposition:  ? ?Status is: Inpatient ?  ? ?Dispo: The patient is from: home ?             Anticipated d/c is to: SNF with palliative care ?             Anticipated d/c date is: need to have bm, then foley removal, then d/c to snf, has bed at [facility], can go on 4/29 ? ?Antimicrobials:   ? ?Anti-infectives (From admission, onward)  ? ? Start     Dose/Rate Route Frequency Ordered Stop  ? 07/22/21 1700  cefTRIAXone (ROCEPHIN) 2 g in sodium chloride 0.9 % 100 mL IVPB       ? 2 g ?200 mL/hr over 30 Minutes Intravenous Every 24 hours 07/22/21 1651 07/28/21 1846  ? 07/22/21 1700  doxycycline (VIBRAMYCIN) 100 mg in sodium chloride 0.9 % 250 mL IVPB       ? 100 mg ?125 mL/hr over 120 Minutes Intravenous Every 12 hours 07/22/21 1651 07/29/21 0739  ? ?  ? ? ? ? ? ?Objective: ?Vitals:  ? 07/30/21 1500 07/30/21 2129 07/31/21 0551 07/31/21 1313  ?BP: 112/66 114/61 (!) 125/54 (!) 98/46  ?Pulse: 89 77 70 71  ?Resp:  '20 20 20  '$ ?Temp: 98 ?F (36.7 ?C) 97.7 ?F (36.5 ?C) 97.7 ?F (36.5 ?C) 97.6 ?F (36.4 ?C)  ?TempSrc: Oral Oral  Oral  ?SpO2: 95% 96% 93% 93%  ?Weight:      ?Height:      ? ? ?Intake/Output Summary (Last 24 hours) at 07/31/2021 1650 ?Last data filed at 07/30/2021 2217 ?Gross per 24 hour  ?Intake --  ?Output 600 ml  ?Net -600 ml  ? ?Filed Weights  ? 07/27/21 0500 07/29/21 0500 07/30/21 0500  ?Weight: 81 kg 77.1 kg 77.6 kg   ? ? ?Examination: ? ?General exam: she appears very weak and deconditioned , she does not know she is at [facility] long, she knows she is at a [place], she is oriented to time and person ?Respiratory system:  diminished at bases, poor respiratory effort, no wheezing, no rales, no rhonchi. ?Cardiovascular system:  IRRR.  ?Gastrointestinal system: Abdomen is nondistended, soft and nontender.  Normal bowel sounds heard. ?Central nervous system: Alert and orientedx2-3. No focal neurological deficits. ?Extremities:  no edema ?Skin: No rashes, lesions or ulcers ?Psychiatry:  appear very tired, calm and cooperative ? ? ? ?Data Reviewed: I have personally reviewed  labs and visualized  imaging studies since the last encounter  and formulate the plan  ? ? ? ? ? ? ?Scheduled Meds: ? apixaban  2.5 mg Oral BID  ? vitamin C  1,000 mg Oral Daily  ? bisacodyl  10 mg Rectal Daily  ? Chlorhexidine Gluconate Cloth  6 each Topical Daily  ? cholecalciferol  2,000 Units Oral Daily  ? diltiazem  15 mg Oral Q12H  ? furosemide  40 mg Oral Daily  ? lactulose  10 g Oral BID  ? pantoprazole  40 mg Oral BID  ? polyethylene glycol  17 g Oral BID  ? pregabalin  75 mg Oral QHS  ? senna-docusate  1 tablet Oral BID  ? sodium chloride flush  3 mL Intravenous Q12H  ? ?Continuous Infusions: ? sodium chloride    ? ? ? LOS: 8 days  ? ? ? ? ?Florencia Reasons, MD PhD FACP ?[facility] ? ?Available via Epic secure chat 7am-7pm for nonurgent issues ?Please page for urgent issues ?To page the attending provider between 7A-7P or the covering provider during after hours 7P-7A, please log into the web site [email or web address] and access using universal [place] password for that web site. If you do not have the password, please call the hospital operator. ? ? ? ?07/31/2021, 4:50 PM  ? ? ?

## 2021-07-31 NOTE — Progress Notes (Signed)
PT Cancellation Note ? ?Patient Details ?Name: NIKISHA FLEECE ?MRN: [id number] ?DOB: [birth year] ? ? ?Cancelled Treatment:    Reason Eval/Treat Not Completed: Medical issues which prohibited therapy, patient  has had enema and waiting for results. Will check back another time. ?Tresa Endo PT ?Acute Rehabilitation Services ?Pager [phone] ?Office [phone] ? ? ? ?Kyran Connaughton, Shella Maxim ?07/31/2021, 2:37 PM ?

## 2021-07-31 NOTE — Progress Notes (Signed)
Digital disimpaction done at this time as ordered. Medium size hard stool removed. Pt tolerated well.  ?

## 2021-07-31 NOTE — Progress Notes (Signed)
[facility] ?NEPHROLOGY PROGRESS NOTE ? ?Assessment/ Plan: ?Pt is a 88 y.o. yo female with with history of hypertension, CHF, CKD stage IV admitted with fluid overload/shortness of breath, seen as a consultation for AKI on CKD. ? ?#Acute kidney injury on CKD stage IV-b/l creatinine 2.1- 2.5 from 2022, egfr 17 - 66m/min.  Creat 3.5 on admit here in setting of marked volume overload, pulm edema, possible pna. AKI likely due to decompensated CHF.  Responded with IV diuretics with -15 L since admission.  The volume/edema is much better.  We held diuretics for the last few days.  Resume oral Lasix 40 mg today. ?Clinically much better.  We will arrange outpatient follow-up in our office. ? ?#Acute CHF exacerbation/fluid overload: Volume management with diuretics as discussed above. ? ?# HTN: Blood pressure soft therefore continue to hold amlodipine and diltiazem dose was reduced.  Diuretics as above. ? ?#Community-acquired pneumonia: Clinically improving, completed antibiotics.  She is currently on room air.   ? ?#Hypokalemia: Due to diuretics.  Replete oral potassium chloride.  Potassium 3.6 today. ? ?#Anemia multifactorial due to GI bleed, CKD, chronic disease: Iron saturation of 10%.  Treated with IV iron and Aranesp.  Monitor hemoglobin. ? ?#Somnolent: More alert awake and sitting on chair today. ? ?Sign off, please call back with question.  Discussed with the primary team. ? ?Subjective: Seen and examined the patient at bedside.  She reports feeling good.  Denies nausea, vomiting, chest pain, shortness of breath.  No new event.  ? ? Objective ?Vital signs in last 24 hours: ?Vitals:  ? 07/30/21 0617 07/30/21 1500 07/30/21 2129 07/31/21 0551  ?BP: (!) 108/44 112/66 114/61 (!) 125/54  ?Pulse: 81 89 77 70  ?Resp: _0 ?Temp: 97.6 ?F (36.4 ?C) 98 ?F (36.7 ?C) 97.7 ?F (36.5 ?C) 97.7 ?F (36.5 ?C)  ?TempSrc: Oral Oral Oral   ?SpO2: 95% 95% 96% 93%  ?Weight:      ?Height:      ? ?Weight change:   ? ?Intake/Output Summary (Last 24 hours) at 07/31/2021 1100 ?Last data filed at 07/30/2021 2217 ?Gross per 24 hour  ?Intake 391.92 ml  ?Output 600 ml  ?Net -208.08 ml  ? ? ? ? ? ? ?Labs: ?Basic Metabolic Panel: ?Recent Labs  ?Lab 07/29/21 ?0411 07/30/21 ?0326 07/31/21 ?05726 ?NA 135 136 136  ?K 3.3* 3.6 4.0  ?CL 87* 92* 95*  ?CO2 34* 33* 30  ?GLUCOSE 93 93 84  ?BUN 77* 76* 72*  ?CREATININE 3.27* 3.11* 2.66*  ?CALCIUM 9.0 9.2 9.4  ?PHOS 7.3* 5.8* 4.9*  ? ? ?Liver Function Tests: ?Recent Labs  ?Lab 07/29/21 ?0411 07/30/21 ?0326 07/31/21 ?02035 ?ALBUMIN 3.1* 3.1* 2.9*  ? ? ?No results for input(s): LIPASE, AMYLASE in the last 168 hours. ?No results for input(s): AMMONIA in the last 168 hours. ?CBC: ?Recent Labs  ?Lab 07/26/21 ?0333 07/27/21 ?0711 07/29/21 ?0411  ?WBC 6.3 6.2 6.2  ?HGB 9.1* 9.7* 8.9*  ?HCT 28.7* 29.9* 27.6*  ?MCV 87.5 86.9 86.8  ?PLT 305 311 302  ? ? ?Cardiac Enzymes: ?No results for input(s): CKTOTAL, CKMB, CKMBINDEX, TROPONINI in the last 168 hours. ?CBG: ?No results for input(s): GLUCAP in the last 168 hours. ? ?Iron Studies: No results for input(s): IRON, TIBC, TRANSFERRIN, FERRITIN in the last 72 hours. ?Studies/Results: ?No results found. ? ?Medications: ?Infusions: ? sodium chloride    ? ? ?Scheduled Medications: ? apixaban  2.5 mg Oral BID  ? vitamin C  1,000 mg Oral Daily  ? Chlorhexidine Gluconate Cloth  6 each Topical Daily  ? cholecalciferol  2,000 Units Oral Daily  ? diltiazem  30 mg Oral Q12H  ? docusate sodium  100 mg Oral BID  ? furosemide  40 mg Oral Daily  ? mineral oil  1 enema Rectal Once  ? pantoprazole  40 mg Oral BID  ? polyethylene glycol  17 g Oral BID  ? pregabalin  75 mg Oral QHS  ? senna  1 tablet Oral BID  ? sodium chloride flush  3 mL Intravenous Q12H  ? ? have reviewed scheduled and prn medications. ? ?Physical Exam: ?General: Sitting on chair comfortable, not in distress, in room air ?Heart:RRR, s1s2 nl ?Lungs: Clear bilateral, no increased work of  breathing. ?Abdomen:soft, Non-tender, non-distended ?Extremities: No edema present. ?Neurology: Alert awake and following commands.. ? ?Krystal Henderson Krystal Henderson ?07/31/2021,11:00 AM ? LOS: 8 days  ? ?

## 2021-08-01 DIAGNOSIS — I119 Hypertensive heart disease without heart failure: Secondary | ICD-10-CM | POA: Diagnosis not present

## 2021-08-01 DIAGNOSIS — K219 Gastro-esophageal reflux disease without esophagitis: Secondary | ICD-10-CM | POA: Diagnosis not present

## 2021-08-01 DIAGNOSIS — S22000A Wedge compression fracture of unspecified thoracic vertebra, initial encounter for closed fracture: Secondary | ICD-10-CM | POA: Diagnosis not present

## 2021-08-01 DIAGNOSIS — I480 Paroxysmal atrial fibrillation: Secondary | ICD-10-CM | POA: Diagnosis not present

## 2021-08-01 DIAGNOSIS — D649 Anemia, unspecified: Secondary | ICD-10-CM | POA: Diagnosis not present

## 2021-08-01 DIAGNOSIS — I5043 Acute on chronic combined systolic (congestive) and diastolic (congestive) heart failure: Secondary | ICD-10-CM

## 2021-08-01 DIAGNOSIS — I10 Essential (primary) hypertension: Secondary | ICD-10-CM | POA: Diagnosis not present

## 2021-08-01 DIAGNOSIS — R5381 Other malaise: Secondary | ICD-10-CM | POA: Diagnosis not present

## 2021-08-01 DIAGNOSIS — M545 Low back pain, unspecified: Secondary | ICD-10-CM | POA: Diagnosis not present

## 2021-08-01 DIAGNOSIS — R531 Weakness: Secondary | ICD-10-CM

## 2021-08-01 DIAGNOSIS — Z9181 History of falling: Secondary | ICD-10-CM | POA: Diagnosis not present

## 2021-08-01 DIAGNOSIS — R519 Headache, unspecified: Secondary | ICD-10-CM | POA: Diagnosis not present

## 2021-08-01 DIAGNOSIS — S81801D Unspecified open wound, right lower leg, subsequent encounter: Secondary | ICD-10-CM | POA: Diagnosis not present

## 2021-08-01 DIAGNOSIS — N184 Chronic kidney disease, stage 4 (severe): Secondary | ICD-10-CM | POA: Diagnosis not present

## 2021-08-01 DIAGNOSIS — R0989 Other specified symptoms and signs involving the circulatory and respiratory systems: Secondary | ICD-10-CM | POA: Diagnosis not present

## 2021-08-01 DIAGNOSIS — R195 Other fecal abnormalities: Secondary | ICD-10-CM | POA: Diagnosis not present

## 2021-08-01 DIAGNOSIS — R279 Unspecified lack of coordination: Secondary | ICD-10-CM | POA: Diagnosis not present

## 2021-08-01 DIAGNOSIS — I4891 Unspecified atrial fibrillation: Secondary | ICD-10-CM | POA: Diagnosis not present

## 2021-08-01 DIAGNOSIS — K5901 Slow transit constipation: Secondary | ICD-10-CM | POA: Diagnosis not present

## 2021-08-01 DIAGNOSIS — I5041 Acute combined systolic (congestive) and diastolic (congestive) heart failure: Secondary | ICD-10-CM | POA: Diagnosis not present

## 2021-08-01 DIAGNOSIS — G629 Polyneuropathy, unspecified: Secondary | ICD-10-CM | POA: Diagnosis not present

## 2021-08-01 DIAGNOSIS — Z743 Need for continuous supervision: Secondary | ICD-10-CM | POA: Diagnosis not present

## 2021-08-01 DIAGNOSIS — N179 Acute kidney failure, unspecified: Secondary | ICD-10-CM | POA: Diagnosis not present

## 2021-08-01 DIAGNOSIS — M6281 Muscle weakness (generalized): Secondary | ICD-10-CM | POA: Diagnosis not present

## 2021-08-01 DIAGNOSIS — G4720 Circadian rhythm sleep disorder, unspecified type: Secondary | ICD-10-CM | POA: Diagnosis not present

## 2021-08-01 DIAGNOSIS — R2689 Other abnormalities of gait and mobility: Secondary | ICD-10-CM | POA: Diagnosis not present

## 2021-08-01 DIAGNOSIS — S81801A Unspecified open wound, right lower leg, initial encounter: Secondary | ICD-10-CM | POA: Diagnosis not present

## 2021-08-01 DIAGNOSIS — R627 Adult failure to thrive: Secondary | ICD-10-CM | POA: Diagnosis not present

## 2021-08-01 DIAGNOSIS — R1311 Dysphagia, oral phase: Secondary | ICD-10-CM | POA: Diagnosis not present

## 2021-08-01 DIAGNOSIS — Z789 Other specified health status: Secondary | ICD-10-CM | POA: Diagnosis not present

## 2021-08-01 DIAGNOSIS — I5042 Chronic combined systolic (congestive) and diastolic (congestive) heart failure: Secondary | ICD-10-CM | POA: Diagnosis not present

## 2021-08-01 DIAGNOSIS — R262 Difficulty in walking, not elsewhere classified: Secondary | ICD-10-CM | POA: Diagnosis not present

## 2021-08-01 MED ORDER — DILTIAZEM HCL 30 MG PO TABS: 15.0000 mg | ORAL_TABLET | Freq: Two times a day (BID) | ORAL | Status: DC

## 2021-08-01 MED ORDER — PREGABALIN 50 MG PO CAPS: 50.0000 mg | ORAL_CAPSULE | Freq: Every day | ORAL | 0 refills | Status: DC

## 2021-08-01 MED ORDER — HYDROXYZINE HCL 10 MG PO TABS
10.0000 mg | ORAL_TABLET | Freq: Three times a day (TID) | ORAL | 0 refills | Status: DC | PRN
Start: 2021-08-01 — End: 2022-02-12

## 2021-08-01 MED ORDER — PANTOPRAZOLE SODIUM 40 MG PO TBEC: 40.0000 mg | DELAYED_RELEASE_TABLET | Freq: Every day | ORAL | Status: DC

## 2021-08-01 MED ORDER — POLYETHYLENE GLYCOL 3350 17 G PO PACK
17.0000 g | PACK | Freq: Two times a day (BID) | ORAL | 0 refills | Status: DC
Start: 2021-08-01 — End: 2023-03-29

## 2021-08-01 MED ORDER — FOLIC ACID 1 MG PO TABS: 1.0000 mg | ORAL_TABLET | Freq: Every day | ORAL | 3 refills | Status: AC

## 2021-08-01 MED ORDER — BISACODYL 10 MG RE SUPP: 10.0000 mg | RECTAL | 0 refills | Status: AC

## 2021-08-01 MED ORDER — VITAMIN B-12 1000 MCG PO TABS
1000.0000 ug | ORAL_TABLET | Freq: Every day | ORAL | Status: DC
Start: 2021-08-01 — End: 2023-03-29

## 2021-08-01 MED ORDER — ACETAMINOPHEN 325 MG PO TABS: 650.0000 mg | ORAL_TABLET | Freq: Four times a day (QID) | ORAL | Status: DC | PRN

## 2021-08-01 MED ORDER — THIAMINE HCL 100 MG PO TABS: 100.0000 mg | ORAL_TABLET | Freq: Every day | ORAL | Status: DC

## 2021-08-01 MED ORDER — SENNOSIDES-DOCUSATE SODIUM 8.6-50 MG PO TABS
1.0000 | ORAL_TABLET | Freq: Two times a day (BID) | ORAL | Status: DC
Start: 2021-08-01 — End: 2022-02-12

## 2021-08-01 NOTE — Progress Notes (Signed)
Report called to [facility] and given to [place], [place]. The patient left with EMS with stable vital signs. Husband is following in POV. All questions were answered to the satisfaction of the patient and family member. Dressing change to the lower extremity was done prior to leaving. All belongings were returned.  ?

## 2021-08-01 NOTE — TOC Transition Note (Signed)
Transition of Care (TOC) - CM/SW Discharge Note ? ? ?Patient Details  ?Name: Krystal Henderson ?MRN: [id number] ?Date of Birth: 03-18-1933 ? ?Transition of Care (TOC) CM/SW Contact:  ?Tawanna Cooler, RN ?Phone Number: ?08/01/2021, 11:19 AM ? ? ?Clinical Narrative:    ? ?Patient going to [facility] in [facility].   Patient and husband are aware.  ?Number to call report is [phone], ask for [facility].  ?EMS called at 11:12 am. ? ? ? ?Final next level of care: [facility] ([place]) ?Barriers to Discharge: Barriers Resolved ? ? ?Patient Goals and CMS Choice ?  ?  ?Choice offered to / list presented to : Spouse ? ?Discharge Placement ?  ?           ?Patient chooses bed at: [facility] ?Patient to be transferred to facility by: EMS ?Name of family member notified: Ngan Qualls, spouse ?Patient and family notified of of transfer: 08/01/21 ? ?Discharge Plan and Services ?           ?  ?DME Agency: NA ?  ?Readmission Risk Interventions ? ?  11/12/2019  ?  2:07 PM  ?Readmission Risk Prevention Plan  ?Transportation Screening Complete  ?Home Care Screening Complete  ?Medication Review (RN CM) Complete  ? ? ? ? ? ?

## 2021-08-01 NOTE — TOC Progression Note (Signed)
Transition of Care (TOC) - Progression Note  ? ? ?Patient Details  ?Name: Krystal Henderson ?MRN: [id number] ?Date of Birth: 04/08/1932 ? ?Transition of Care (TOC) CM/SW Contact  ?Tawanna Cooler, RN ?Phone Number: ?08/01/2021, 8:29 AM ? ?Clinical Narrative:    ? ?Patient to go to [facility] today.  Called admissions rep to confirm bed still available.  Awaiting a return call before scheduling transport.  ? ? ? ? ?Expected Discharge Plan: [place] ?Barriers to Discharge: Continued Medical Work up ? ?Expected Discharge Plan and Services ?Expected Discharge Plan: [place] ?  ?  ?  ?Living arrangements for the past 2 months: [place] ?Expected Discharge Date: 08/01/21               ?  ?  ?  ?

## 2021-08-01 NOTE — Plan of Care (Signed)
?  Problem: Education: ?Goal: Ability to demonstrate management of disease process will improve ?Outcome: Adequate for Discharge ?Goal: Ability to verbalize understanding of medication therapies will improve ?Outcome: Adequate for Discharge ?Goal: Individualized Educational Video(s) ?Outcome: Adequate for Discharge ?  ?Problem: Activity: ?Goal: Capacity to carry out activities will improve ?Outcome: Adequate for Discharge ?  ?Problem: Cardiac: ?Goal: Ability to achieve and maintain adequate cardiopulmonary perfusion will improve ?Outcome: Adequate for Discharge ?  ?Problem: Education: ?Goal: Knowledge of General Education information will improve ?Description: Including pain rating scale, medication(s)/side effects and non-pharmacologic comfort measures ?Outcome: Adequate for Discharge ?  ?Problem: Activity: ?Goal: Risk for activity intolerance will decrease ?Outcome: Adequate for Discharge ?  ?Problem: Safety: ?Goal: Ability to remain free from injury will improve ?Outcome: Adequate for Discharge ?  ?Problem: Skin Integrity: ?Goal: Risk for impaired skin integrity will decrease ?Outcome: Adequate for Discharge ?  ?Problem: Respiratory: ?Goal: Ability to maintain adequate ventilation will improve ?Outcome: Adequate for Discharge ?Goal: Ability to maintain a clear airway will improve ?Outcome: Adequate for Discharge ?  ?

## 2021-08-01 NOTE — Discharge Summary (Signed)
?Discharge Summary ? ?Krystal Henderson URK:[id number] DOB: 02-Apr-1933 ? ?PCP: Krystal Henderson ? ?Admit date: 07/22/2021 ?Discharge date: 08/01/2021 ? ?Time spent: 8mns, more than 50% time spent on coordination of care.  ? ?Recommendations for Outpatient Follow-up:  ?F/u with SNF MD for hospital discharge follow up, repeat cbc/bmp at follow up ?F/u with nephrology for CKD ?F/u with GI if overt Gi bleed ?F/u with cardiology for heart failure/afib management ?Wound care to sacral wound and RLE wound ?Recommend Palliative care to follow patient at SNF ? ? ?Discharge Diagnoses:  ?Active Hospital Problems  ? Diagnosis Date Noted  ? Acute exacerbation of CHF (congestive heart failure) ([facility] 07/22/2021  ? CH[facility] (congestive heart failure) ([facility] 07/24/2021  ? Leg wound, right 07/23/2021  ? Generalized weakness   ? CKD (chronic kidney disease) stage 4, GFR 15-29 ml/min (HCC)   ? Headache   ? Paroxysmal atrial fibrillation ([facility] 11/10/2019  ? CAP (community acquired pneumonia) 05/08/2011  ? Vitamin B12 deficiency 09/24/2010  ? Peripheral neuropathy 09/24/2010  ? Normocytic anemia 09/24/2010  ? GERD (gastroesophageal reflux disease) 08/12/2009  ? DJD (degenerative joint disease) 09/08/2007  ? HTN (hypertension) 01/23/2007  ?  ?Resolved Hospital Problems  ?No resolved problems to display.  ? ? ?Discharge Condition: stable ? ?Diet recommendation: heart healthy ? ?Filed Weights  ? 07/29/21 0500 07/30/21 0500 08/01/21 0339  ?Weight: 77.1 kg 77.6 kg 76.7 kg  ? ? ?History of present illness: (Per admitting MD Dr. RDennison Henderson ?Krystal ROSAis a 88y.o. female with medical history significant of combined HF Ef 45-50 %, hypertension, chronic low back pain, compression fracture, GERD, B12 deficiency, who presents with generalized weakness, inability to stand up from her recliner.  She had breakfast this morning and she has not been able to sleep well lately.  She got so weak that she could not stand from her recliner today. Also  had some tremors.   She has been having a dry cough for the last 2 or 3 days.  She report mild nausea.  Denies fever.  She has chronic back pain related to fracture, for which she cannot have surgery.  She takes Tylenol but does not really help.  She denies abdominal pain, last bowel movement yesterday.  She does have problem with constipation.  She has a history of basal cell cancer of lower extremity status post removal 2 weeks ago.  She follows with her dermatologist yesterday and was recommended oral doxycycline and dressing changes twice daily.  ?  ?She denies chest pain.  She was found to be hypoxic in the ED and was place on 2 L oxygen. She report LE edema, worse few days. Report SOB.  ?  ?She presented also complaining of headaches, when asked, she is really having pain in her scalp. Her dermatologist gave her some medication, cream for her scalp.  ?She also report a lump in her throat since she was started on her heart meds. Worse at night, after she takes Tums it goes away.  ? ?Hospital Course:  ?Principal Problem: ?  Acute exacerbation of CHF (congestive heart failure) ([facility] ?Active Problems: ?  HTN (hypertension) ?  GERD (gastroesophageal reflux disease) ?  DJD (degenerative joint disease) ?  Vitamin B12 deficiency ?  Peripheral neuropathy ?  Normocytic anemia ?  CAP (community acquired pneumonia) ?  Paroxysmal atrial fibrillation (HCC) ?  Generalized weakness ?  CKD (chronic kidney disease) stage 4, GFR 15-29 ml/min (HCC) ?  Headache ?  Leg  wound, right ?  CHF (congestive heart failure) ([place]) ? ? ?Assessment and Plan: ? ?* Acute on chronic combined CHF  ?-Patient presents with cough, SOB, elevation BNP, weakness, LE edema. Chest x ray with HF finding.  ?ECHO; Ef 55 % ?-seen by cardiology, detail please see consultation note on 4/20, cardiology has signed off on 4/20 ?-she received iv lasix, Improving, now on room air, edema down, weight down ?- lasix held for several days due to aki,nephrology recommend   oral lasix '40mg'$  daily from 4/28. ?  ?Not able to rule out underline pneumonia, though procalcitonin was not significantly elevated, she finished total of 7 days abx treatment with rocephin and doxycycline while in the hospital ?  ? Paroxysmal atrial fibrillation (HCC) ?In A-fib, rate controlled ?On diltiazem dose reduce. Not able to tolerate betablocker due to fatigue per cards note in the past ?Eliquis held initially due to concerns of anemia, no overt bleeding, GI recommend to resume eliquis and monitor.  ?Hgb  remains stable.  ?  ?Blood pressure low normal ?Am cortisol 15.9, ? tsh 3.3 ?discontinue home meds norvasc and cozaar, reduce cardizem dose ?Bp stable at discharge  ?  ? Hypokalemia ?Replaced and normalized  ?  ?AKI on CKD stage IV, concern for cardiorenal syndrome.  ?Renal US no hydronephrosis.  ?Continue to have good urine output.  ?Cr baseline per records 2.5---2.1, Cr  on admission 3.5, cr has improved , cr now close to baseline at  2.66 ?Appreciate nephrology assistance.  ?  ?   ?Normocytic anemia, component of anemia of chronic disease in addition to possible intermittent blood loss due to hemorrhoid ?-Seen by GI, per GI note from 4/21 "Heme neg stools. H/O occ BRBPR d/t hemorrhoids. She has normocytic anemia Hb 10.7 to 8 (over a month). Neg EGD/colon July 2011"  "Recommend to restart Eliquis and monitor for any GI bleeding" ?"Hold off on any endoscopic procedures d/t advanced age and multiple comorbidities.  Besides, she did refuse as well. Can always reconsider if she has any active bleeding.  Please call if there is any change in clinical status." ?-Hb stable ? -received iron on 4/21 and 4/17,aranesp [place] x1 on 4/27 per nephro ?  ?GERD (gastroesophageal reflux disease) ?Continue with PPI.  ?  ?Peripheral neuropathy ?Continue with lyrica qhs ?  ?Vitamin B12 deficiency ?On supplement.  ?  ?DJD (degenerative joint disease) ?Vicodin  for chronic back pain and headaches.  ?  ?Leg wound, right ?She has  Right LE wound after excision of basal cell cancer 2 weeks ago.  ?She has some redness. Her dermatologist prescribe Doxycycline for 7 days , finished abx treatment while in the hospital ?Blood culture no growth to date.  ?  ?Headache ?CT head negative for acute finding.  ?Her pain is more scalp pain, irritation.  ?Needs to follow up with dermatologist  ?Denies headache in the last few days ?  ?  ?Generalized weakness ?A.m. cortisol level unremarkable ?Suspect deconditioning, als related to HF, LE edema.  ?PT /OT consult recommend SNF ?  ?  ?Constipation ?Ab Xray showed "stool distending the rectum. Rectal distention of 6.1 cm" ?Required digital disimpaction,  ?MiraLAX, Senokot, add suppository ?  ?  ?I have Reviewed nursing notes, Vitals, pain scores, I/o's, Lab results and  imaging results since pt's last encounter, details please see discussion above  ?  ?  ?  ?DVT prophylaxis: apixaban (ELIQUIS) tablet 2.5 mg Start: 07/24/21 2200 ?apixaban (ELIQUIS) tablet 2.5 mg  ?  ?Code  Status:   Code Status: DNR ?  ? ?Discharge Exam: ?BP (!) 112/51 (BP Location: Left Arm)   Pulse 72   Temp 97.6 ?F (36.4 ?C) (Oral)   Resp 18   Ht '5\' 5"'$  (1.651 m)   Wt 76.7 kg   SpO2 98%   BMI 28.12 kg/m?  ? ?General: frail, aaox3, appear to have mild impaired memory  ?Cardiovascular: IRRR ?Respiratory: diminished at bases, poor respiratory effort, no wheezing, no rales, no rhonchi ? ? ? ?Discharge Instructions   ? ? Diet - low sodium heart healthy   Complete by: As directed ?  ? Discharge wound care:   Complete by: As directed ?  ? Wound care to sacral wound, cleanse with NS, pat dry. Cover affected area with strip of silver hydrofiver ( Aquacel Ag+), top with dry gause and cover with silicone foam, placed with "tip" oriented away from anus. Turn side to side and minimize time in the supine position. ?Wound care to RLE "place white petrolatum gauze over the biopsy site on the RLE twice daily "  ? Increase activity slowly   Complete by:  As directed ?  ? ?  ? ?Allergies as of 08/01/2021   ? ?   Reactions  ? Codeine Nausea Only  ? Duloxetine Other (See Comments)  ? REACTION: intolerance--She does not remember taking this Rx- states she did not f

## 2021-08-04 DIAGNOSIS — K5901 Slow transit constipation: Secondary | ICD-10-CM | POA: Diagnosis not present

## 2021-08-04 DIAGNOSIS — K219 Gastro-esophageal reflux disease without esophagitis: Secondary | ICD-10-CM | POA: Diagnosis not present

## 2021-08-04 DIAGNOSIS — S81801A Unspecified open wound, right lower leg, initial encounter: Secondary | ICD-10-CM | POA: Diagnosis not present

## 2021-08-04 DIAGNOSIS — R519 Headache, unspecified: Secondary | ICD-10-CM | POA: Diagnosis not present

## 2021-08-04 DIAGNOSIS — N179 Acute kidney failure, unspecified: Secondary | ICD-10-CM | POA: Diagnosis not present

## 2021-08-04 DIAGNOSIS — R5381 Other malaise: Secondary | ICD-10-CM | POA: Diagnosis not present

## 2021-08-04 DIAGNOSIS — R0989 Other specified symptoms and signs involving the circulatory and respiratory systems: Secondary | ICD-10-CM | POA: Diagnosis not present

## 2021-08-04 DIAGNOSIS — I4891 Unspecified atrial fibrillation: Secondary | ICD-10-CM | POA: Diagnosis not present

## 2021-08-04 DIAGNOSIS — S22000A Wedge compression fracture of unspecified thoracic vertebra, initial encounter for closed fracture: Secondary | ICD-10-CM | POA: Diagnosis not present

## 2021-08-04 DIAGNOSIS — N184 Chronic kidney disease, stage 4 (severe): Secondary | ICD-10-CM | POA: Diagnosis not present

## 2021-08-04 DIAGNOSIS — I10 Essential (primary) hypertension: Secondary | ICD-10-CM | POA: Diagnosis not present

## 2021-08-04 DIAGNOSIS — I5041 Acute combined systolic (congestive) and diastolic (congestive) heart failure: Secondary | ICD-10-CM | POA: Diagnosis not present

## 2021-08-05 ENCOUNTER — Ambulatory Visit: Payer: Medicare Other | Admitting: Physician Assistant

## 2021-08-05 DIAGNOSIS — S81801A Unspecified open wound, right lower leg, initial encounter: Secondary | ICD-10-CM | POA: Diagnosis not present

## 2021-08-05 DIAGNOSIS — M6281 Muscle weakness (generalized): Secondary | ICD-10-CM | POA: Diagnosis not present

## 2021-08-05 DIAGNOSIS — R0989 Other specified symptoms and signs involving the circulatory and respiratory systems: Secondary | ICD-10-CM | POA: Diagnosis not present

## 2021-08-05 DIAGNOSIS — N184 Chronic kidney disease, stage 4 (severe): Secondary | ICD-10-CM | POA: Diagnosis not present

## 2021-08-05 DIAGNOSIS — I119 Hypertensive heart disease without heart failure: Secondary | ICD-10-CM | POA: Diagnosis not present

## 2021-08-05 DIAGNOSIS — I5043 Acute on chronic combined systolic (congestive) and diastolic (congestive) heart failure: Secondary | ICD-10-CM | POA: Diagnosis not present

## 2021-08-05 DIAGNOSIS — I480 Paroxysmal atrial fibrillation: Secondary | ICD-10-CM | POA: Diagnosis not present

## 2021-08-05 DIAGNOSIS — I5041 Acute combined systolic (congestive) and diastolic (congestive) heart failure: Secondary | ICD-10-CM | POA: Diagnosis not present

## 2021-08-05 DIAGNOSIS — D649 Anemia, unspecified: Secondary | ICD-10-CM | POA: Diagnosis not present

## 2021-08-05 DIAGNOSIS — M545 Low back pain, unspecified: Secondary | ICD-10-CM | POA: Diagnosis not present

## 2021-08-05 DIAGNOSIS — R262 Difficulty in walking, not elsewhere classified: Secondary | ICD-10-CM | POA: Diagnosis not present

## 2021-08-06 ENCOUNTER — Other Ambulatory Visit: Payer: Self-pay | Admitting: *Deleted

## 2021-08-06 DIAGNOSIS — I480 Paroxysmal atrial fibrillation: Secondary | ICD-10-CM | POA: Diagnosis not present

## 2021-08-06 DIAGNOSIS — N184 Chronic kidney disease, stage 4 (severe): Secondary | ICD-10-CM | POA: Diagnosis not present

## 2021-08-06 DIAGNOSIS — Z9181 History of falling: Secondary | ICD-10-CM | POA: Diagnosis not present

## 2021-08-06 DIAGNOSIS — I119 Hypertensive heart disease without heart failure: Secondary | ICD-10-CM | POA: Diagnosis not present

## 2021-08-06 DIAGNOSIS — R195 Other fecal abnormalities: Secondary | ICD-10-CM | POA: Diagnosis not present

## 2021-08-06 DIAGNOSIS — R262 Difficulty in walking, not elsewhere classified: Secondary | ICD-10-CM | POA: Diagnosis not present

## 2021-08-06 DIAGNOSIS — I5042 Chronic combined systolic (congestive) and diastolic (congestive) heart failure: Secondary | ICD-10-CM | POA: Diagnosis not present

## 2021-08-06 DIAGNOSIS — D649 Anemia, unspecified: Secondary | ICD-10-CM | POA: Diagnosis not present

## 2021-08-06 DIAGNOSIS — S81801A Unspecified open wound, right lower leg, initial encounter: Secondary | ICD-10-CM | POA: Diagnosis not present

## 2021-08-06 DIAGNOSIS — I5043 Acute on chronic combined systolic (congestive) and diastolic (congestive) heart failure: Secondary | ICD-10-CM | POA: Diagnosis not present

## 2021-08-06 DIAGNOSIS — M545 Low back pain, unspecified: Secondary | ICD-10-CM | POA: Diagnosis not present

## 2021-08-06 DIAGNOSIS — N179 Acute kidney failure, unspecified: Secondary | ICD-10-CM | POA: Diagnosis not present

## 2021-08-06 DIAGNOSIS — M6281 Muscle weakness (generalized): Secondary | ICD-10-CM | POA: Diagnosis not present

## 2021-08-06 NOTE — Patient Outreach (Signed)
Per [place] eligible member currently resides in  [facility]. Screened for potential [facility] care coordination services as a benefit of [employer] plan. ? ?Member's PCP at [place] has [place] care coordination team available if needed. ? ?Krystal Henderson admitted to SNF on 08/01/21 after hospitalization. ? ?Facility site visit to [facility] skilled nursing facility. Met with Krystal Henderson, SNF SW concerning  transition plan and potential [facility] needs. Anticipated transition plan is return home with spouse on May 13th. Advised to contact member's husband to discuss [facility] follow up. Krystal Henderson also reports family declined palliative follow up in SNF.  ? ?Went to Krystal Henderson' room at [place]. Husband not present. Left [facility] Care Management brochure, contact information, and 24-hr nurse advice line magnet at bedside. Krystal Henderson was working with therapy. ? ?Will plan outreach to Krystal Henderson to discuss [facility] services.   ? ? ?Krystal Rolling, MSN, RN,BSN ?[place] Coordinator ?[phone] [facility]) ?[phone]  (Toll free office)  ?

## 2021-08-07 ENCOUNTER — Ambulatory Visit: Payer: Medicare Other | Admitting: Physician Assistant

## 2021-08-07 DIAGNOSIS — G4720 Circadian rhythm sleep disorder, unspecified type: Secondary | ICD-10-CM | POA: Diagnosis not present

## 2021-08-07 DIAGNOSIS — G629 Polyneuropathy, unspecified: Secondary | ICD-10-CM | POA: Diagnosis not present

## 2021-08-07 DIAGNOSIS — Z789 Other specified health status: Secondary | ICD-10-CM | POA: Diagnosis not present

## 2021-08-10 DIAGNOSIS — I119 Hypertensive heart disease without heart failure: Secondary | ICD-10-CM | POA: Diagnosis not present

## 2021-08-10 DIAGNOSIS — M6281 Muscle weakness (generalized): Secondary | ICD-10-CM | POA: Diagnosis not present

## 2021-08-10 DIAGNOSIS — Z9181 History of falling: Secondary | ICD-10-CM | POA: Diagnosis not present

## 2021-08-10 DIAGNOSIS — I480 Paroxysmal atrial fibrillation: Secondary | ICD-10-CM | POA: Diagnosis not present

## 2021-08-10 DIAGNOSIS — M545 Low back pain, unspecified: Secondary | ICD-10-CM | POA: Diagnosis not present

## 2021-08-10 DIAGNOSIS — S81801A Unspecified open wound, right lower leg, initial encounter: Secondary | ICD-10-CM | POA: Diagnosis not present

## 2021-08-10 DIAGNOSIS — R262 Difficulty in walking, not elsewhere classified: Secondary | ICD-10-CM | POA: Diagnosis not present

## 2021-08-10 DIAGNOSIS — I5043 Acute on chronic combined systolic (congestive) and diastolic (congestive) heart failure: Secondary | ICD-10-CM | POA: Diagnosis not present

## 2021-08-12 ENCOUNTER — Other Ambulatory Visit: Payer: Self-pay | Admitting: *Deleted

## 2021-08-12 DIAGNOSIS — I5043 Acute on chronic combined systolic (congestive) and diastolic (congestive) heart failure: Secondary | ICD-10-CM | POA: Diagnosis not present

## 2021-08-12 DIAGNOSIS — M545 Low back pain, unspecified: Secondary | ICD-10-CM | POA: Diagnosis not present

## 2021-08-12 DIAGNOSIS — R262 Difficulty in walking, not elsewhere classified: Secondary | ICD-10-CM | POA: Diagnosis not present

## 2021-08-12 DIAGNOSIS — Z9181 History of falling: Secondary | ICD-10-CM | POA: Diagnosis not present

## 2021-08-12 DIAGNOSIS — M6281 Muscle weakness (generalized): Secondary | ICD-10-CM | POA: Diagnosis not present

## 2021-08-12 DIAGNOSIS — I10 Essential (primary) hypertension: Secondary | ICD-10-CM | POA: Diagnosis not present

## 2021-08-12 DIAGNOSIS — I119 Hypertensive heart disease without heart failure: Secondary | ICD-10-CM | POA: Diagnosis not present

## 2021-08-12 DIAGNOSIS — S81801A Unspecified open wound, right lower leg, initial encounter: Secondary | ICD-10-CM | POA: Diagnosis not present

## 2021-08-12 DIAGNOSIS — I480 Paroxysmal atrial fibrillation: Secondary | ICD-10-CM | POA: Diagnosis not present

## 2021-08-12 NOTE — Patient Outreach (Signed)
[place] Coordinator follow up. [facility]N eligible member screened for potential [facility] Care Management services as a benefit of member's insurance plan. Krystal Henderson currently resides in [facility]. ? ?Member's PCP at [place] has [facility] Embedded care coordination. ? ?Telephone call made to Krystal Henderson (spouse/DPR)  to discuss [facility] follow up. However, no answer. HIPAA compliant voicemail message left to request return call.  ? ?Will continue to follow. ? ? ?[name], MSN, RN,BSN ?[place] Coordinator ?[phone] [facility]) ?[phone]  (Toll free office)   ? ?

## 2021-08-13 ENCOUNTER — Other Ambulatory Visit: Payer: Self-pay | Admitting: *Deleted

## 2021-08-13 ENCOUNTER — Ambulatory Visit: Payer: Medicare Other | Admitting: Family Medicine

## 2021-08-13 DIAGNOSIS — M545 Low back pain, unspecified: Secondary | ICD-10-CM | POA: Diagnosis not present

## 2021-08-13 DIAGNOSIS — M6281 Muscle weakness (generalized): Secondary | ICD-10-CM | POA: Diagnosis not present

## 2021-08-13 DIAGNOSIS — Z9181 History of falling: Secondary | ICD-10-CM | POA: Diagnosis not present

## 2021-08-13 DIAGNOSIS — I5043 Acute on chronic combined systolic (congestive) and diastolic (congestive) heart failure: Secondary | ICD-10-CM | POA: Diagnosis not present

## 2021-08-13 DIAGNOSIS — I480 Paroxysmal atrial fibrillation: Secondary | ICD-10-CM | POA: Diagnosis not present

## 2021-08-13 DIAGNOSIS — R262 Difficulty in walking, not elsewhere classified: Secondary | ICD-10-CM | POA: Diagnosis not present

## 2021-08-13 DIAGNOSIS — I119 Hypertensive heart disease without heart failure: Secondary | ICD-10-CM | POA: Diagnosis not present

## 2021-08-13 DIAGNOSIS — I10 Essential (primary) hypertension: Secondary | ICD-10-CM

## 2021-08-13 DIAGNOSIS — S81801A Unspecified open wound, right lower leg, initial encounter: Secondary | ICD-10-CM | POA: Diagnosis not present

## 2021-08-13 NOTE — Patient Outreach (Signed)
[facility]N Post- Acute Care Coordinator follow up. Per [place] eligible member currently resides in [facility]. Screening for potential [facility] care coordination services as a benefit of [employer] plan. ? ?Member's PCP at [place] has [place] care coordination team available. ? ?Facility site visit to [facility] skilled nursing facility. Met with Kirstin, SNF SW regarding transition plan and potential [facility]N needs. Anticipated transition plan is to return home with spouse on Saturday, May 13th with [place] home health.  ? ?Spoke with member in room at SNF to discuss transition plans and [facility] follow up. [name] Psychologist, clinical to contact her husband [name] to discuss [facility] services.  ? ?Telephone call made to [name] (husband/DPR) [phone] to discuss [facility] services. Patient identifiers confirmed. [name] endorses he received [place] Management brochure and 24-hr nurse advice line magnet that was previously left in member's room. Explained PCP office has [facility] care management/care coordination services. [name] states member has had some med adjustments for her blood pressure since being in SNF. Discussed [facility] follow up will not interfere with home health. [name] confirms member is slated to transition home on Saturday. [name] is agreeable to [facility] follow up. ? ?Confirmed PCP is Dr. Carollee Herter with [facility]. ?Confirmed best contact number for Mr. Minar (spouse/DPR/primary contact) is [phone] or (cell) [phone]. ? ?Will make referral to [place] (embedded) for care coordination/disease management. ? ?[name] has history of CHF, HTN, GERD, compression fracture, chronic low back pain.  ? ?. ? ?[name], MSN, RN,BSN ?[place] Coordinator ?[phone] [facility]) ?[phone]  (Toll free office)  ?

## 2021-08-14 DIAGNOSIS — N184 Chronic kidney disease, stage 4 (severe): Secondary | ICD-10-CM | POA: Diagnosis not present

## 2021-08-14 DIAGNOSIS — S22000A Wedge compression fracture of unspecified thoracic vertebra, initial encounter for closed fracture: Secondary | ICD-10-CM | POA: Diagnosis not present

## 2021-08-14 DIAGNOSIS — I4891 Unspecified atrial fibrillation: Secondary | ICD-10-CM | POA: Diagnosis not present

## 2021-08-14 DIAGNOSIS — S81801D Unspecified open wound, right lower leg, subsequent encounter: Secondary | ICD-10-CM | POA: Diagnosis not present

## 2021-08-14 DIAGNOSIS — G629 Polyneuropathy, unspecified: Secondary | ICD-10-CM | POA: Diagnosis not present

## 2021-08-14 DIAGNOSIS — D649 Anemia, unspecified: Secondary | ICD-10-CM | POA: Diagnosis not present

## 2021-08-14 DIAGNOSIS — R519 Headache, unspecified: Secondary | ICD-10-CM | POA: Diagnosis not present

## 2021-08-14 DIAGNOSIS — I5041 Acute combined systolic (congestive) and diastolic (congestive) heart failure: Secondary | ICD-10-CM | POA: Diagnosis not present

## 2021-08-17 ENCOUNTER — Telehealth: Payer: Self-pay | Admitting: *Deleted

## 2021-08-17 NOTE — Chronic Care Management (AMB) (Signed)
?  Care Management  ? ?Outreach Note ? ?08/17/2021 ?Name: Krystal Henderson MRN: [id number] DOB: May 25, 1932 ? ?Referred by: Ann Held, DO ?Reason for referral : Care Coordination (Outreach to schedule referral with [facility] ) ? ? ?An unsuccessful telephone outreach was attempted today. The patient was referred to the case management team for assistance with care management and care coordination.  ? ?Follow Up Plan:  ?A HIPAA compliant phone message was left for the patient providing contact information and requesting a return call.  ? ?Caylor Tallarico, CCMA ?Care Guide, Embedded Care Coordination ?[place]  Care Management  ?Direct Dial: [phone] ? ? ?

## 2021-08-18 ENCOUNTER — Encounter: Payer: Self-pay | Admitting: Family Medicine

## 2021-08-18 ENCOUNTER — Ambulatory Visit (INDEPENDENT_AMBULATORY_CARE_PROVIDER_SITE_OTHER): Payer: Medicare Other | Admitting: Family Medicine

## 2021-08-18 VITALS — BP 100/72 | HR 84 | Temp 97.1°F | Resp 20 | Ht 65.0 in | Wt 151.2 lb

## 2021-08-18 DIAGNOSIS — I4891 Unspecified atrial fibrillation: Secondary | ICD-10-CM

## 2021-08-18 DIAGNOSIS — D649 Anemia, unspecified: Secondary | ICD-10-CM | POA: Diagnosis not present

## 2021-08-18 DIAGNOSIS — I509 Heart failure, unspecified: Secondary | ICD-10-CM

## 2021-08-18 DIAGNOSIS — E538 Deficiency of other specified B group vitamins: Secondary | ICD-10-CM | POA: Diagnosis not present

## 2021-08-18 DIAGNOSIS — I5043 Acute on chronic combined systolic (congestive) and diastolic (congestive) heart failure: Secondary | ICD-10-CM

## 2021-08-18 NOTE — Patient Instructions (Signed)
Preventing Pressure Injuries ? ?A pressure injury, sometimes called a bedsore or a pressure ulcer, is an injury to the skin and underlying tissue caused by pressure. A pressure injury can happen when your skin presses against a surface, such as a mattress or wheelchair seat, for too long. The pressure on the blood vessels causes reduced blood flow to your skin. This can eventually cause the skin tissue to die and break down into a wound. ?Pressure injuries usually develop: ?Over bony parts of the body, such as the tailbone, shoulders, elbows, hips, and heels. ?Under medical devices, such as respiratory equipment, stockings, tubes, and splints. ?How can this condition affect me? ?Pressure injuries are caused by a lack of blood supply to an area of skin. These injuries begin as a reddened area on the skin and can become an open sore. They can result from intense pressure over a short period of time or from less pressure over a long period of time. ?Pressure injuries can vary in severity. They can cause pain, muscle damage, and infection. ?What can increase my risk? ?This condition is more likely to develop in people who: ?Are in the hospital or an extended care facility. ?Are bedridden or in a wheelchair. ?Have an injury or disease that keeps them from: ?Moving normally. ?Feeling pain or pressure. ?Communicating if they feel pain or pressure. ?Have a condition that: ?Makes them sleepy or less alert. ?Causes poor blood flow. ?Need to wear a medical device. ?Have poor control of their bladder or bowel functions (incontinence). ?Have poor nutrition (malnutrition). ?Have had this condition before. ?Are of certain ethnicities. People of African American, Latino, or Hispanic descent are at higher risk compared to other ethnic groups. ?What actions can I take to prevent pressure injuries? ?Reducing and redistributing pressure ?Do not lie or sit in one position for a long time. Move or change position: ?Every hour when out of  bed in a chair. ?Every two hours when in bed. ?As often as told by your health care provider. ?Use pillows, wedges, or cushions to redistribute pressure. Ask your health care provider to recommend a mattress, cushions, or pads for you. ?Use medical devices that do not rub your skin. Tell your health care provider if one of your medical devices is causing pain or irritation. ?Skin care ?If you are in the hospital, your health care providers: ?Will inspect your skin, including areas under or around medical devices, at least twice a day. ?May recommend that you use certain types of bedding to help prevent pressure injuries. These may include a pad, mattress, or chair cushion that is filled with gel, air, water, or foam. ?Will evaluate your nutrition and consult a dietitian if needed. ?Will inspect and change any wound dressings regularly. ?May help you move into different positions every few hours. ?Will adjust any medical devices and braces as needed to limit pressure on your skin. ?Will keep your skin clean and dry. ?May use gentle cleansers and skin protectants if you are incontinent. ?Will moisturize any dry skin. ?In general, at home: ?Keep your skin clean and dry. Gently pat your skin dry. ?Do not rub or massage bony areas of your skin. ?Moisturize dry skin. ?Use gentle cleansers and skin protectants routinely if you are incontinent. ?Check your skin at least once a day for any changes in color and for any new blisters or sores. Make sure to check under and around any medical devices and between skin folds. Have a caregiver do this for you if  you are not able. ? ?Lifestyle ?Be as active as you can every day. Ask your health care provider to suggest safe exercises or activities. ?Do not abuse drugs or alcohol. ?Do not use any products that contain nicotine or tobacco, such as cigarettes, e-cigarettes, and chewing tobacco. If you need help quitting, ask your health care provider. ?General instructions ? ?Take  over-the-counter and prescription medicines only as told by your health care provider. ?Work with your health care provider to manage any chronic health conditions. ?Eat a healthy diet that includes protein, vitamins, and minerals. Ask your health care provider what types of food you should eat. ?Drink enough fluid to keep your urine pale yellow. ?Keep all follow-up visits as told by your health care provider. This is important. ?Contact a health care provider if you: ?Feel or see any changes in your skin. ?Summary ?A pressure injury, sometimes called a bedsore or a pressure ulcer, is an injury to the skin and underlying tissue caused by pressure. ?Do not lie or sit in one position for a long time. ?Check your skin at least once a day for any changes in color and for any new blisters or sores. ?Make sure to check under and around any medical devices and between skin folds. Have a caregiver do this for you if you are not able. ?Eat a healthy diet that includes protein, vitamins, and minerals. Ask your health care provider what types of food you should eat. ?This information is not intended to replace advice given to you by your health care provider. Make sure you discuss any questions you have with your health care provider. ?Document Revised: 01/15/2021 Document Reviewed: 01/15/2021 ?Elsevier Patient Education ? [place]. ? ?

## 2021-08-18 NOTE — Progress Notes (Signed)
Subjective:   By signing my name below, I, Shehryar Baig, attest that this documentation has been prepared under the direction and in the presence of Ann Held, DO  08/18/2021    Patient ID: Krystal Henderson, female    DOB: [birth year], 88 y.o.   MRN: [id number]  Chief Complaint  Patient presents with   Hospitalization Follow-up    HPI Patient is in today for a hospital follow up. Her husband is present with her during this visit.   She was admitted to the hospital on 07/22/2021 for weakness. After she was discharged she was taken to rehab for a couple of weeks. She was given iron infusions while in the hospital. She was also given vitamin B12 supplements on discharge. She was also taken off of valsartan due to her blood pressure being low. She has no taken it since and reports no new issues. Her blood pressure is low during this visit.  BP Readings from Last 3 Encounters:  08/18/21 100/72  08/01/21 98/61  06/22/21 112/60   Pulse Readings from Last 3 Encounters:  08/18/21 84  08/01/21 85  06/22/21 84   She does not feel like she recovered fully. Her husband reports seeing improvements since she was released from rehab. She continues seeing a home health physical therapist every Thursday. She continues seeing wound care. Her husband is also changing her dressings as well at home. Her husband reports she sleeps in a chair every night so she does not develop any more wounds. She has no made a follow up appointment with her cardiologist and is planning on scheduling one.  She seen another provider a month prior due to weakness and was given a B12 injection. She is taking 1000 mcg vitamin B12 supplements daily.  She continues complaining of constant low back pain.    Past Medical History:  Diagnosis Date   Anemia    hx of    Angiodysplasia 2007   @ colonoscopy   Anxiety    PMH of   Chronic low back pain 06/21/2014   Diverticulosis of colon (without mention of  hemorrhage)    DJD (degenerative joint disease)    Esophageal reflux    inactive   History of vertebral fracture 11/2015   HTN (hypertension)    Hyperlipemia 2006   LDL 130   Hypoglycemia    reactive   Pelvic fracture ([place]) 12/30/2011   [facility]   Peripheral neuropathy    Personal history of colonic polyps    adenomatous   Vitamin B12 deficiency     Past Surgical History:  Procedure Laterality Date   cataract surgery  12-26-12 and 01-09-13   COLONOSCOPY  2011   neg   COLONOSCOPY W/ POLYPECTOMY     X 2 , Dr  Verl Blalock; angiodysplasia. Due 2022   DILATION AND CURETTAGE OF UTERUS     FACIAL COSMETIC SURGERY     HEMORROIDECTOMY     LUMBAR LAMINECTOMY/DECOMPRESSION MICRODISCECTOMY  09/10/2011   Procedure: LUMBAR LAMINECTOMY/DECOMPRESSION MICRODISCECTOMY;  Surgeon: Johnn Hai, MD;  Location: [facility];  Service: Orthopedics;  Laterality: N/A;  decompression laminectomy L2-3, L3-4, L4-5   ORIF WRIST FRACTURE  01/11/2012   Procedure: OPEN REDUCTION INTERNAL FIXATION (ORIF) WRIST FRACTURE;  Surgeon: Tennis Must, MD;  Location: [place];  Service: Orthopedics;  Laterality: Right;   TEAR DUCT PROBING     X 2   TOTAL HIP ARTHROPLASTY  2000   right   TUBAL  LIGATION      Family History  Problem Relation Age of Onset   Heart attack Father 79   Kidney disease Mother        renal failure   Throat cancer Sister        smoker   Osteoporosis Sister    Heart attack Brother 27   Stroke Brother 52       smoker   Depression Maternal Uncle    Cancer Brother        X3  lung cancer, all smokers   Peripheral vascular disease Daughter    Colon cancer Neg Hx    Diabetes Neg Hx     Social History   Socioeconomic History   Marital status: Married    Spouse name: Fritz Pickerel   Number of children: 1   Years of education: hs   Highest education level: Not on file  Occupational History   Occupation: retired    Fish farm manager: RETIRED  Tobacco Use   Smoking status:  Never   Smokeless tobacco: Never  Substance and Sexual Activity   Alcohol use: Yes    Alcohol/week: 3.0 standard drinks    Types: 3 Glasses of wine per week    Comment: wine occasionally   Drug use: No   Sexual activity: Yes    Birth control/protection: Diaphragm  Other Topics Concern   Not on file  Social History Narrative   Patient is right handed.   Patient does not drink caffeine.   Lives with husband   Yolanda Bonine lives nearby   Social Determinants of Health   Financial Resource Strain: Low Risk    Difficulty of Paying Living Expenses: Not very hard  Food Insecurity: No Food Insecurity   Worried About Charity fundraiser in the Last Year: Never true   Arboriculturist in the Last Year: Never true  Transportation Needs: No Transportation Needs   Lack of Transportation (Medical): No   Lack of Transportation (Non-Medical): No  Physical Activity: Insufficiently Active   Days of Exercise per Week: 6 days   Minutes of Exercise per Session: 10 min  Stress: Stress Concern Present   Feeling of Stress : To some extent  Social Connections: Moderately Isolated   Frequency of Communication with Friends and Family: More than three times a week   Frequency of Social Gatherings with Friends and Family: More than three times a week   Attends Religious Services: Never   Marine scientist or Organizations: No   Attends Music therapist: Never   Marital Status: Married  Human resources officer Violence: Not At Risk   Fear of Current or Ex-Partner: No   Emotionally Abused: No   Physically Abused: No   Sexually Abused: No    Outpatient Medications Prior to Visit  Medication Sig Dispense Refill   acetaminophen (TYLENOL) 325 MG tablet Take 2 tablets (650 mg total) by mouth every 6 (six) hours as needed for mild pain (or Fever >/= 101).     Ascorbic Acid (VITAMIN C) 1000 MG tablet Take 1,000 mg by mouth daily.     Calcium Carbonate (CALCIUM 600 PO) Take 600 mg by mouth daily.      Cholecalciferol (VITAMIN D) 2000 UNITS CAPS Take 2,000 Units by mouth daily.     diltiazem (CARDIZEM) 30 MG tablet Take 0.5 tablets (15 mg total) by mouth every 12 (twelve) hours.     ELIQUIS 2.5 MG TABS tablet TAKE 1 TABLET(2.5 MG) BY MOUTH TWICE DAILY (Patient  taking differently: Take 2.5 mg by mouth 2 (two) times daily.) 60 tablet 5   folic acid (FOLVITE) 1 MG tablet Take 1 tablet (1 mg total) by mouth daily.  3   furosemide (LASIX) 40 MG tablet Take 1 tablet (40 mg total) by mouth daily. (Patient taking differently: Take 20 mg by mouth daily.) 90 tablet 3   hydrOXYzine (ATARAX) 10 MG tablet Take 1 tablet (10 mg total) by mouth 3 (three) times daily as needed for anxiety. 30 tablet 0   pantoprazole (PROTONIX) 40 MG tablet Take 1 tablet (40 mg total) by mouth daily.     Polyethyl Glycol-Propyl Glycol (SYSTANE ULTRA OP) Place 1 drop into both eyes daily as needed (for dry eyes).     polyethylene glycol (MIRALAX / GLYCOLAX) 17 g packet Take 17 g by mouth 2 (two) times daily. 14 each 0   pregabalin (LYRICA) 50 MG capsule Take 1 capsule (50 mg total) by mouth at bedtime. 30 capsule 0   senna-docusate (SENOKOT-S) 8.6-50 MG tablet Take 1 tablet by mouth 2 (two) times daily.     thiamine 100 MG tablet Take 1 tablet (100 mg total) by mouth daily.     vitamin B-12 (CYANOCOBALAMIN) 1000 MCG tablet Take 1 tablet (1,000 mcg total) by mouth daily.     No facility-administered medications prior to visit.    Allergies  Allergen Reactions   Codeine Nausea Only   Duloxetine Other (See Comments)    REACTION: intolerance--She does not remember taking this Rx- states she did not feel well    Sertraline Hcl Other (See Comments)    REACTION: intolerance-- Did not help with Depression    Review of Systems  Constitutional:  Negative for fever and malaise/fatigue.  HENT:  Negative for congestion.   Eyes:  Negative for blurred vision.  Respiratory:  Negative for shortness of breath.   Cardiovascular:   Negative for chest pain, palpitations and leg swelling.  Gastrointestinal:  Negative for abdominal pain, blood in stool and nausea.  Genitourinary:  Negative for dysuria and frequency.  Musculoskeletal:  Negative for falls.  Skin:  Positive for rash (buttock).  Neurological:  Positive for weakness. Negative for dizziness, loss of consciousness and headaches.  Endo/Heme/Allergies:  Negative for environmental allergies.  Psychiatric/Behavioral:  Negative for depression. The patient is not nervous/anxious.       Objective:    Physical Exam Constitutional:      General: She is not in acute distress.    Appearance: Normal appearance. She is not ill-appearing.  HENT:     Head: Normocephalic and atraumatic.     Right Ear: External ear normal.     Left Ear: External ear normal.  Eyes:     Extraocular Movements: Extraocular movements intact.     Pupils: Pupils are equal, round, and reactive to light.  Cardiovascular:     Rate and Rhythm: Normal rate and regular rhythm.     Heart sounds: Normal heart sounds. No murmur heard.   No gallop.  Pulmonary:     Effort: Pulmonary effort is normal. No respiratory distress.     Breath sounds: Normal breath sounds. No wheezing or rales.  Skin:    General: Skin is warm and dry.  Neurological:     Mental Status: She is alert and oriented to person, place, and time.  Psychiatric:        Judgment: Judgment normal.    BP 100/72 (BP Location: Right Arm, Patient Position: Sitting, Cuff Size: Normal)  Pulse 84   Temp (!) 97.1 F (36.2 C) (Oral)   Resp 20   Ht '5\' 5"'$  (1.651 m)   Wt 151 lb 3.2 oz (68.6 kg)   SpO2 98%   BMI 25.16 kg/m  Wt Readings from Last 3 Encounters:  08/18/21 151 lb 3.2 oz (68.6 kg)  08/01/21 169 lb (76.7 kg)  06/22/21 161 lb (73 kg)    Diabetic Foot Exam - Simple   No data filed    Lab Results  Component Value Date   WBC 6.2 08/18/2021   HGB 10.6 (L) 08/18/2021   HCT 33.6 (L) 08/18/2021   PLT 420.0 (H) 08/18/2021    GLUCOSE 84 08/18/2021   CHOL 230 (H) 02/12/2021   TRIG 96.0 02/12/2021   HDL 87.30 02/12/2021   LDLDIRECT 129.0 10/18/2014   LDLCALC 123 (H) 02/12/2021   ALT 13 08/18/2021   AST 13 08/18/2021   NA 139 08/18/2021   K 4.2 08/18/2021   CL 101 08/18/2021   CREATININE 2.59 (H) 08/18/2021   BUN 32 (H) 08/18/2021   CO2 26 08/18/2021   TSH 3.311 07/23/2021   INR 1.7 (H) 07/22/2021   HGBA1C 5.7 (H) 11/09/2019    Lab Results  Component Value Date   TSH 3.311 07/23/2021   Lab Results  Component Value Date   WBC 6.2 08/18/2021   HGB 10.6 (L) 08/18/2021   HCT 33.6 (L) 08/18/2021   MCV 90.1 08/18/2021   PLT 420.0 (H) 08/18/2021   Lab Results  Component Value Date   NA 139 08/18/2021   K 4.2 08/18/2021   CO2 26 08/18/2021   GLUCOSE 84 08/18/2021   BUN 32 (H) 08/18/2021   CREATININE 2.59 (H) 08/18/2021   BILITOT 0.4 08/18/2021   ALKPHOS 120 (H) 08/18/2021   AST 13 08/18/2021   ALT 13 08/18/2021   PROT 6.5 08/18/2021   ALBUMIN 3.7 08/18/2021   CALCIUM 9.2 08/18/2021   ANIONGAP 11 07/31/2021   GFR 16.20 (L) 08/18/2021   Lab Results  Component Value Date   CHOL 230 (H) 02/12/2021   Lab Results  Component Value Date   HDL 87.30 02/12/2021   Lab Results  Component Value Date   LDLCALC 123 (H) 02/12/2021   Lab Results  Component Value Date   TRIG 96.0 02/12/2021   Lab Results  Component Value Date   CHOLHDL 3 02/12/2021   Lab Results  Component Value Date   HGBA1C 5.7 (H) 11/09/2019       Assessment & Plan:   Problem List Items Addressed This Visit       Unprioritized   CHF (congestive heart failure) ([place]) - Primary   Relevant Orders   Ambulatory referral to Cardiology   Normocytic anemia   Relevant Orders   CBC with Differential/Platelet (Completed)   Comprehensive metabolic panel (Completed)   Vitamin B12 (Completed)   Acute on chronic combined systolic and diastolic CHF (congestive heart failure) ([place])    Refer to cardiology con't lasix          Other Visit Diagnoses     B12 deficiency       Relevant Orders   CBC with Differential/Platelet (Completed)   Comprehensive metabolic panel (Completed)   Vitamin B12 (Completed)   Atrial fibrillation, unspecified type [facility])       Relevant Orders   Ambulatory referral to Cardiology        No orders of the defined types were placed in this encounter.   Stephan Minister  Chase, DO, personally preformed the services described in this documentation.  All medical record entries made by the scribe were at my direction and in my presence.  I have reviewed the chart and discharge instructions (if applicable) and agree that the record reflects my personal performance and is accurate and complete. 08/18/2021   I,Shehryar Baig,acting as a scribe for Ann Held, DO.,have documented all relevant documentation on the behalf of Ann Held, DO,as directed by  Ann Held, DO while in the presence of Ann Held, DO.   Ann Held, DO

## 2021-08-19 LAB — COMPREHENSIVE METABOLIC PANEL
ALT: 13 U/L (ref 0–35)
AST: 13 U/L (ref 0–37)
Albumin: 3.7 g/dL (ref 3.5–5.2)
Alkaline Phosphatase: 120 U/L — ABNORMAL HIGH (ref 39–117)
BUN: 32 mg/dL — ABNORMAL HIGH (ref 6–23)
CO2: 26 mEq/L (ref 19–32)
Calcium: 9.2 mg/dL (ref 8.4–10.5)
Chloride: 101 mEq/L (ref 96–112)
Creatinine, Ser: 2.59 mg/dL — ABNORMAL HIGH (ref 0.40–1.20)
GFR: 16.2 mL/min — ABNORMAL LOW (ref >60.00)
Glucose, Bld: 84 mg/dL (ref 70–99)
Potassium: 4.2 mEq/L (ref 3.5–5.1)
Sodium: 139 mEq/L (ref 135–145)
Total Bilirubin: 0.4 mg/dL (ref 0.2–1.2)
Total Protein: 6.5 g/dL (ref 6.0–8.3)

## 2021-08-19 LAB — CBC WITH DIFFERENTIAL/PLATELET
Basophils Absolute: 0 10*3/uL (ref 0.0–0.1)
Basophils Relative: 0.7 % (ref 0.0–3.0)
Eosinophils Absolute: 0.1 10*3/uL (ref 0.0–0.7)
Eosinophils Relative: 1.1 % (ref 0.0–5.0)
HCT: 33.6 % — ABNORMAL LOW (ref 36.0–46.0)
Hemoglobin: 10.6 g/dL — ABNORMAL LOW (ref 12.0–15.0)
Lymphocytes Relative: 20.4 % (ref 12.0–46.0)
Lymphs Abs: 1.3 10*3/uL (ref 0.7–4.0)
MCHC: 31.6 g/dL (ref 30.0–36.0)
MCV: 90.1 fl (ref 78.0–100.0)
Monocytes Absolute: 0.5 10*3/uL (ref 0.1–1.0)
Monocytes Relative: 7.4 % (ref 3.0–12.0)
Neutro Abs: 4.4 10*3/uL (ref 1.4–7.7)
Neutrophils Relative %: 70.4 % (ref 43.0–77.0)
Platelets: 420 10*3/uL — ABNORMAL HIGH (ref 150.0–400.0)
RBC: 3.73 Mil/uL — ABNORMAL LOW (ref 3.87–5.11)
RDW: 20.5 % — ABNORMAL HIGH (ref 11.5–15.5)
WBC: 6.2 10*3/uL (ref 4.0–10.5)

## 2021-08-19 LAB — VITAMIN B12: Vitamin B-12: >1504 pg/mL — ABNORMAL HIGH (ref 211–911)

## 2021-08-19 NOTE — Chronic Care Management (AMB) (Signed)
?  Care Management  ? ?Note ? ?08/19/2021 ?Name: JAZMYN OFFNER MRN: [id number] DOB: 03-13-1933 ? ?Krystal Henderson is a 88 y.o. year old female who is a primary care patient of Ann Held, DO. I reached out to [name] by phone today offer care coordination services.  ? ?Ms. Silveria was given information about care management services today including:  ?Care management services include personalized support from designated clinical staff supervised by her physician, including individualized plan of care and coordination with other care providers ?24/7 contact phone numbers for assistance for urgent and routine care needs. ?The patient may stop care management services at any time by phone call to the office staff. ? ?Patient did not agree to enrollment in care management services and does not wish to consider at this time. ? ?Follow up plan: ?Patient declines further follow up and engagement by the care management team. Appropriate care team members and provider have been notified via electronic communication.  ? ?Elwyn Klosinski, CCMA ?Care Guide, Embedded Care Coordination ?[place] [facility]  [facility]  ?Direct Dial: [phone] ? ? ?

## 2021-08-20 ENCOUNTER — Encounter: Payer: Self-pay | Admitting: Family Medicine

## 2021-08-20 ENCOUNTER — Telehealth: Payer: Self-pay | Admitting: Family Medicine

## 2021-08-20 DIAGNOSIS — Z7901 Long term (current) use of anticoagulants: Secondary | ICD-10-CM | POA: Diagnosis not present

## 2021-08-20 DIAGNOSIS — M810 Age-related osteoporosis without current pathological fracture: Secondary | ICD-10-CM | POA: Diagnosis not present

## 2021-08-20 DIAGNOSIS — K5901 Slow transit constipation: Secondary | ICD-10-CM | POA: Diagnosis not present

## 2021-08-20 DIAGNOSIS — K219 Gastro-esophageal reflux disease without esophagitis: Secondary | ICD-10-CM | POA: Diagnosis not present

## 2021-08-20 DIAGNOSIS — I480 Paroxysmal atrial fibrillation: Secondary | ICD-10-CM | POA: Diagnosis not present

## 2021-08-20 DIAGNOSIS — D631 Anemia in chronic kidney disease: Secondary | ICD-10-CM | POA: Diagnosis not present

## 2021-08-20 DIAGNOSIS — I130 Hypertensive heart and chronic kidney disease with heart failure and stage 1 through stage 4 chronic kidney disease, or unspecified chronic kidney disease: Secondary | ICD-10-CM | POA: Diagnosis not present

## 2021-08-20 DIAGNOSIS — G8929 Other chronic pain: Secondary | ICD-10-CM | POA: Diagnosis not present

## 2021-08-20 DIAGNOSIS — Z8781 Personal history of (healed) traumatic fracture: Secondary | ICD-10-CM | POA: Diagnosis not present

## 2021-08-20 DIAGNOSIS — E538 Deficiency of other specified B group vitamins: Secondary | ICD-10-CM | POA: Diagnosis not present

## 2021-08-20 DIAGNOSIS — D630 Anemia in neoplastic disease: Secondary | ICD-10-CM | POA: Diagnosis not present

## 2021-08-20 DIAGNOSIS — K5790 Diverticulosis of intestine, part unspecified, without perforation or abscess without bleeding: Secondary | ICD-10-CM | POA: Diagnosis not present

## 2021-08-20 DIAGNOSIS — G629 Polyneuropathy, unspecified: Secondary | ICD-10-CM | POA: Diagnosis not present

## 2021-08-20 DIAGNOSIS — Z9181 History of falling: Secondary | ICD-10-CM | POA: Diagnosis not present

## 2021-08-20 DIAGNOSIS — C44712 Basal cell carcinoma of skin of right lower limb, including hip: Secondary | ICD-10-CM | POA: Diagnosis not present

## 2021-08-20 DIAGNOSIS — I5041 Acute combined systolic (congestive) and diastolic (congestive) heart failure: Secondary | ICD-10-CM | POA: Diagnosis not present

## 2021-08-20 DIAGNOSIS — E785 Hyperlipidemia, unspecified: Secondary | ICD-10-CM | POA: Diagnosis not present

## 2021-08-20 DIAGNOSIS — M1990 Unspecified osteoarthritis, unspecified site: Secondary | ICD-10-CM | POA: Diagnosis not present

## 2021-08-20 DIAGNOSIS — N184 Chronic kidney disease, stage 4 (severe): Secondary | ICD-10-CM | POA: Diagnosis not present

## 2021-08-20 DIAGNOSIS — M545 Low back pain, unspecified: Secondary | ICD-10-CM | POA: Diagnosis not present

## 2021-08-20 NOTE — Telephone Encounter (Signed)
Verbal given 

## 2021-08-20 NOTE — Telephone Encounter (Signed)
Krystal Henderson called for orders for [facility] PT  1X1, 2X4, and 1X4. Okay to lvm on mailbox

## 2021-08-20 NOTE — Assessment & Plan Note (Signed)
Refer to cardiology con't lasix

## 2021-08-24 ENCOUNTER — Other Ambulatory Visit: Payer: Self-pay | Admitting: Neurology

## 2021-08-24 NOTE — Telephone Encounter (Signed)
I spoke with the patient who stated her ER doctor decreased her dosage of Lyrica to 50 mg. She reports a painful burning sensation in her legs on the decreased dosage.  As per last office visit note, "Will continue the Lyrica for now, but may consider a dose reduction in the future if pain continues to be well controlled (on 100 mg now, try 75 mg in future), Her pain worsened on generic Lyrica."  Please advise.

## 2021-08-24 NOTE — Telephone Encounter (Signed)
Pt's husband has called to report that while pt was in the hospital one of the dr's reduced pt's LYRICA from 100 down to '50mg'$ .  Husband would like Sarah, NP to write a Rx for pt to go back to '100mg'$  since she did well on that.

## 2021-08-25 DIAGNOSIS — S81801A Unspecified open wound, right lower leg, initial encounter: Secondary | ICD-10-CM | POA: Diagnosis not present

## 2021-08-25 MED ORDER — PREGABALIN 50 MG PO CAPS: 50.0000 mg | ORAL_CAPSULE | Freq: Every day | ORAL | 5 refills | Status: DC

## 2021-08-25 NOTE — Addendum Note (Signed)
Addended by: Desmond Lope on: 08/25/2021 04:15 PM   Modules accepted: Orders

## 2021-08-25 NOTE — Telephone Encounter (Signed)
I spoke to the patient. She understands the reason for the reduction of pregabalin to '50mg'$  daily. She is currently taking generic and doing okay. She is waking up with her feet feeling cold and numb but feels it is tolerable at this time. She was instructed to call us back for any worsening symptoms. Otherwise, she will keep her pending follow up 10/07/21.   She does need a refill of pregabalin '50mg'$ , one cap daily sent to the pharmacy.

## 2021-08-25 NOTE — Telephone Encounter (Signed)
Pt is asking for a call back from Michelle, RN 

## 2021-08-25 NOTE — Telephone Encounter (Signed)
I think the dose reduction of Lyrica to 50 mg daily is appropriate given her kidney disease, her calculated creatinine clearance is 17, with this, the recommended daily dose of Lyrica is no more than 50 mg.

## 2021-08-25 NOTE — Telephone Encounter (Signed)
I have discussed this patient with Judson Roch and she has reviewed the chart. She would like her to continue Lyrica '50mg'$  daily due to her kidney disease. Increasing this medication would not be appropriate. We will continue to monitor her symptoms and can further discuss at her next visit on 10/07/21. If discomfort is intolerable and they are insistent to change treatment now, then she would be willing to restart nortriptyline '10mg'$ , one tab QHS.  I left messages on the home and mobile numbers.

## 2021-08-25 NOTE — Telephone Encounter (Signed)
I called patient to discuss. No answer, left a message asking her to call us back. If patient calls back another day please route to POD 2. 

## 2021-08-26 ENCOUNTER — Telehealth: Payer: Self-pay | Admitting: Family Medicine

## 2021-08-26 DIAGNOSIS — I480 Paroxysmal atrial fibrillation: Secondary | ICD-10-CM | POA: Diagnosis not present

## 2021-08-26 DIAGNOSIS — M810 Age-related osteoporosis without current pathological fracture: Secondary | ICD-10-CM | POA: Diagnosis not present

## 2021-08-26 DIAGNOSIS — N184 Chronic kidney disease, stage 4 (severe): Secondary | ICD-10-CM | POA: Diagnosis not present

## 2021-08-26 DIAGNOSIS — I5041 Acute combined systolic (congestive) and diastolic (congestive) heart failure: Secondary | ICD-10-CM | POA: Diagnosis not present

## 2021-08-26 DIAGNOSIS — G8929 Other chronic pain: Secondary | ICD-10-CM | POA: Diagnosis not present

## 2021-08-26 DIAGNOSIS — Z8781 Personal history of (healed) traumatic fracture: Secondary | ICD-10-CM | POA: Diagnosis not present

## 2021-08-26 DIAGNOSIS — E785 Hyperlipidemia, unspecified: Secondary | ICD-10-CM | POA: Diagnosis not present

## 2021-08-26 DIAGNOSIS — I130 Hypertensive heart and chronic kidney disease with heart failure and stage 1 through stage 4 chronic kidney disease, or unspecified chronic kidney disease: Secondary | ICD-10-CM | POA: Diagnosis not present

## 2021-08-26 DIAGNOSIS — K5790 Diverticulosis of intestine, part unspecified, without perforation or abscess without bleeding: Secondary | ICD-10-CM | POA: Diagnosis not present

## 2021-08-26 DIAGNOSIS — D631 Anemia in chronic kidney disease: Secondary | ICD-10-CM | POA: Diagnosis not present

## 2021-08-26 DIAGNOSIS — E538 Deficiency of other specified B group vitamins: Secondary | ICD-10-CM | POA: Diagnosis not present

## 2021-08-26 DIAGNOSIS — Z9181 History of falling: Secondary | ICD-10-CM | POA: Diagnosis not present

## 2021-08-26 DIAGNOSIS — D630 Anemia in neoplastic disease: Secondary | ICD-10-CM | POA: Diagnosis not present

## 2021-08-26 DIAGNOSIS — C44712 Basal cell carcinoma of skin of right lower limb, including hip: Secondary | ICD-10-CM | POA: Diagnosis not present

## 2021-08-26 DIAGNOSIS — M1990 Unspecified osteoarthritis, unspecified site: Secondary | ICD-10-CM | POA: Diagnosis not present

## 2021-08-26 DIAGNOSIS — M545 Low back pain, unspecified: Secondary | ICD-10-CM | POA: Diagnosis not present

## 2021-08-26 DIAGNOSIS — K219 Gastro-esophageal reflux disease without esophagitis: Secondary | ICD-10-CM | POA: Diagnosis not present

## 2021-08-26 DIAGNOSIS — G629 Polyneuropathy, unspecified: Secondary | ICD-10-CM | POA: Diagnosis not present

## 2021-08-26 DIAGNOSIS — K5901 Slow transit constipation: Secondary | ICD-10-CM | POA: Diagnosis not present

## 2021-08-26 DIAGNOSIS — Z7901 Long term (current) use of anticoagulants: Secondary | ICD-10-CM | POA: Diagnosis not present

## 2021-08-26 NOTE — Telephone Encounter (Signed)
Spoke with Krystal Henderson. She states pt didn't have any complaints of dizziness. Pt not on bp medication but is taking a fluid pill. Pt hadn't eaten but was drinking fluids. Pt's husband recheck blood pressure on pt's wrist and got 90/42. Please advise

## 2021-08-26 NOTE — Telephone Encounter (Signed)
[name]: Ebony Hail w/ [facility] [phone]  Ebony Hail reported low BP reading for pt 84/44

## 2021-08-28 NOTE — Telephone Encounter (Signed)
Spoke with patient. Pt states feeling fine; no sxs of low blood pressure. Pt states blood pressure was 105/52 at around 1230 today.

## 2021-09-01 DIAGNOSIS — D631 Anemia in chronic kidney disease: Secondary | ICD-10-CM | POA: Diagnosis not present

## 2021-09-01 DIAGNOSIS — K5901 Slow transit constipation: Secondary | ICD-10-CM | POA: Diagnosis not present

## 2021-09-01 DIAGNOSIS — N184 Chronic kidney disease, stage 4 (severe): Secondary | ICD-10-CM | POA: Diagnosis not present

## 2021-09-01 DIAGNOSIS — Z8781 Personal history of (healed) traumatic fracture: Secondary | ICD-10-CM | POA: Diagnosis not present

## 2021-09-01 DIAGNOSIS — E538 Deficiency of other specified B group vitamins: Secondary | ICD-10-CM | POA: Diagnosis not present

## 2021-09-01 DIAGNOSIS — K5790 Diverticulosis of intestine, part unspecified, without perforation or abscess without bleeding: Secondary | ICD-10-CM | POA: Diagnosis not present

## 2021-09-01 DIAGNOSIS — M1990 Unspecified osteoarthritis, unspecified site: Secondary | ICD-10-CM | POA: Diagnosis not present

## 2021-09-01 DIAGNOSIS — I130 Hypertensive heart and chronic kidney disease with heart failure and stage 1 through stage 4 chronic kidney disease, or unspecified chronic kidney disease: Secondary | ICD-10-CM | POA: Diagnosis not present

## 2021-09-01 DIAGNOSIS — K219 Gastro-esophageal reflux disease without esophagitis: Secondary | ICD-10-CM | POA: Diagnosis not present

## 2021-09-01 DIAGNOSIS — Z7901 Long term (current) use of anticoagulants: Secondary | ICD-10-CM | POA: Diagnosis not present

## 2021-09-01 DIAGNOSIS — D630 Anemia in neoplastic disease: Secondary | ICD-10-CM | POA: Diagnosis not present

## 2021-09-01 DIAGNOSIS — G8929 Other chronic pain: Secondary | ICD-10-CM | POA: Diagnosis not present

## 2021-09-01 DIAGNOSIS — G629 Polyneuropathy, unspecified: Secondary | ICD-10-CM | POA: Diagnosis not present

## 2021-09-01 DIAGNOSIS — M810 Age-related osteoporosis without current pathological fracture: Secondary | ICD-10-CM | POA: Diagnosis not present

## 2021-09-01 DIAGNOSIS — M545 Low back pain, unspecified: Secondary | ICD-10-CM | POA: Diagnosis not present

## 2021-09-01 DIAGNOSIS — I5041 Acute combined systolic (congestive) and diastolic (congestive) heart failure: Secondary | ICD-10-CM | POA: Diagnosis not present

## 2021-09-01 DIAGNOSIS — E785 Hyperlipidemia, unspecified: Secondary | ICD-10-CM | POA: Diagnosis not present

## 2021-09-01 DIAGNOSIS — Z9181 History of falling: Secondary | ICD-10-CM | POA: Diagnosis not present

## 2021-09-01 DIAGNOSIS — I480 Paroxysmal atrial fibrillation: Secondary | ICD-10-CM | POA: Diagnosis not present

## 2021-09-01 DIAGNOSIS — C44712 Basal cell carcinoma of skin of right lower limb, including hip: Secondary | ICD-10-CM | POA: Diagnosis not present

## 2021-09-02 DIAGNOSIS — N184 Chronic kidney disease, stage 4 (severe): Secondary | ICD-10-CM | POA: Diagnosis not present

## 2021-09-02 DIAGNOSIS — I5042 Chronic combined systolic (congestive) and diastolic (congestive) heart failure: Secondary | ICD-10-CM | POA: Diagnosis not present

## 2021-09-02 DIAGNOSIS — I129 Hypertensive chronic kidney disease with stage 1 through stage 4 chronic kidney disease, or unspecified chronic kidney disease: Secondary | ICD-10-CM | POA: Diagnosis not present

## 2021-09-02 DIAGNOSIS — I480 Paroxysmal atrial fibrillation: Secondary | ICD-10-CM | POA: Diagnosis not present

## 2021-09-02 DIAGNOSIS — N179 Acute kidney failure, unspecified: Secondary | ICD-10-CM | POA: Diagnosis not present

## 2021-09-02 NOTE — Progress Notes (Unsigned)
Cardiology Office Note Date:  09/02/2021  Patient ID:  Jordyn, Hofacker January 08, 1933, MRN [id number] PCP:  Ann Held, DO  Electrophysiologist: Dr. Curt Bears Nephrology: Dr. Katheren Puller    Chief Complaint:  hospital f/u  History of Present Illness: KENZLI BARRITT is a 88 y.o. female with history of HTN, HLD, PE, AFib, chronic CHF (systolic), CKD (IV)  She comes in today to be seen for Dr. Curt Bears, last seen by him 11/24/20, she was fatigued, suspected the meds.  She had a 5% AF burden on a monitor.  Toprol changed to dilt in effort to alleviate some of her fatigue.  She was hospitalized 07/22/21 with progressive weakness, to the point of unable to stand independently, dry cough a couple dys, LE swelling, found anemic (8.9hgb) and hypoxic w/CXR noting CHF and perhaps a pneumonia, she was in AFib rates 50s. Our cardiology team consulted. LVEF 50-55%, mild LVH, mild-mod MR Markedly volume OL with diuresis Creat on the rise  to the 3's Her [facility] held on admission 2/2 her anemia, suggested perhaps palliative with rising creat, no wishes for HD, advanced age and debility Neohrology also aided her stay GI, Hold off on any endoscopic procedures d/t advanced age and multiple comorbidities.  Besides, she did refuse as well. Can always reconsider if she has any active bleeding", Eliquis resumed Discharged 08/01/21, with Creat 2.66 near her baseline  Saw medicine team Labs 08/18/21 K+ 4.2 BUN/Creat 32/2.59 Hgb 10.6  TODAY She is accompanied by her husband She spent 2 weeks in rehab and is home now a couple weeks, and very happy to be home. Her only c/o is of her terrible back pain, they report a chronic compression fracture. She denies any CP, palpitations or cardiac awareness of any kind. She denies any SOB, does not do too much in the way of activity, but denies DOE. No near syncope or syncope. No bleeding or signs of bleeding  She is edematous, her husband reports when she left  the hospital was ll gone, slowly though has krept back in, though not as much They saw her nephrologist yesterday, no changes were made, reported her kidny function as improved some.   AFib/AAD hx Diagnosed 11/2019 in the environment of a PE   Past Medical History:  Diagnosis Date   Anemia    hx of    Angiodysplasia 2007   @ colonoscopy   Anxiety    PMH of   Chronic low back pain 06/21/2014   Diverticulosis of colon (without mention of hemorrhage)    DJD (degenerative joint disease)    Esophageal reflux    inactive   History of vertebral fracture 11/2015   HTN (hypertension)    Hyperlipemia 2006   LDL 130   Hypoglycemia    reactive   Pelvic fracture ([place]) 12/30/2011   [facility]   Peripheral neuropathy    Personal history of colonic polyps    adenomatous   Vitamin B12 deficiency     Past Surgical History:  Procedure Laterality Date   cataract surgery  12-26-12 and 01-09-13   COLONOSCOPY  2011   neg   COLONOSCOPY W/ POLYPECTOMY     X 2 , Dr  Verl Blalock; angiodysplasia. Due 2022   DILATION AND CURETTAGE OF UTERUS     FACIAL COSMETIC SURGERY     HEMORROIDECTOMY     LUMBAR LAMINECTOMY/DECOMPRESSION MICRODISCECTOMY  09/10/2011   Procedure: LUMBAR LAMINECTOMY/DECOMPRESSION MICRODISCECTOMY;  Surgeon: Johnn Hai, MD;  Location: [facility];  Service: Orthopedics;  Laterality: N/A;  decompression laminectomy L2-3, L3-4, L4-5   ORIF WRIST FRACTURE  01/11/2012   Procedure: OPEN REDUCTION INTERNAL FIXATION (ORIF) WRIST FRACTURE;  Surgeon: Tennis Must, MD;  Location: [facility];  Service: Orthopedics;  Laterality: Right;   TEAR DUCT PROBING     X 2   TOTAL HIP ARTHROPLASTY  2000   right   TUBAL LIGATION      Current Outpatient Medications  Medication Sig Dispense Refill   acetaminophen (TYLENOL) 325 MG tablet Take 2 tablets (650 mg total) by mouth every 6 (six) hours as needed for mild pain (or Fever >/= 101).     Ascorbic Acid (VITAMIN C) 1000 MG  tablet Take 1,000 mg by mouth daily.     Calcium Carbonate (CALCIUM 600 PO) Take 600 mg by mouth daily.     Cholecalciferol (VITAMIN D) 2000 UNITS CAPS Take 2,000 Units by mouth daily.     diltiazem (CARDIZEM) 30 MG tablet Take 0.5 tablets (15 mg total) by mouth every 12 (twelve) hours.     ELIQUIS 2.5 MG TABS tablet TAKE 1 TABLET(2.5 MG) BY MOUTH TWICE DAILY (Patient taking differently: Take 2.5 mg by mouth 2 (two) times daily.) 60 tablet 5   folic acid (FOLVITE) 1 MG tablet Take 1 tablet (1 mg total) by mouth daily.  3   furosemide (LASIX) 40 MG tablet Take 1 tablet (40 mg total) by mouth daily. (Patient taking differently: Take 20 mg by mouth daily.) 90 tablet 3   hydrOXYzine (ATARAX) 10 MG tablet Take 1 tablet (10 mg total) by mouth 3 (three) times daily as needed for anxiety. 30 tablet 0   pantoprazole (PROTONIX) 40 MG tablet Take 1 tablet (40 mg total) by mouth daily.     Polyethyl Glycol-Propyl Glycol (SYSTANE ULTRA OP) Place 1 drop into both eyes daily as needed (for dry eyes).     polyethylene glycol (MIRALAX / GLYCOLAX) 17 g packet Take 17 g by mouth 2 (two) times daily. 14 each 0   pregabalin (LYRICA) 50 MG capsule Take 1 capsule (50 mg total) by mouth at bedtime. 30 capsule 5   senna-docusate (SENOKOT-S) 8.6-50 MG tablet Take 1 tablet by mouth 2 (two) times daily.     thiamine 100 MG tablet Take 1 tablet (100 mg total) by mouth daily.     vitamin B-12 (CYANOCOBALAMIN) 1000 MCG tablet Take 1 tablet (1,000 mcg total) by mouth daily.     No current facility-administered medications for this visit.    Allergies:   Codeine, Duloxetine, and Sertraline hcl   Social History:  The patient  reports that she has never smoked. She has never used smokeless tobacco. She reports current alcohol use of about 3.0 standard drinks per week. She reports that she does not use drugs.   Family History:  The patient's family history includes Cancer in her brother; Depression in her maternal uncle; Heart  attack (age of onset: 67) in her brother; Heart attack (age of onset: 49) in her father; Kidney disease in her mother; Osteoporosis in her sister; Peripheral vascular disease in her daughter; Stroke (age of onset: 55) in her brother; Throat cancer in her sister.  ROS:  Please see the history of present illness.    All other systems are reviewed and otherwise negative.   PHYSICAL EXAM:  VS:  There were no vitals taken for this visit. BMI: There is no height or weight on file to calculate BMI. Well  nourished, well developed, in no acute distress HEENT: normocephalic, atraumatic Neck: no JVD, carotid bruits or masses Cardiac:  irreg-irreg; no significant murmurs, no rubs, or gallops Lungs:  CTA b/l, no wheezing, rhonchi or rales Abd: soft, nontender MS: no deformity or atrophy Ext: chronic skin changes shins/lower legs, she has 1-2+ edema LLE to midshin, less on the right edema Skin: warm and dry, no rash Neuro:  No gross deficits appreciated Psych: euthymic mood, full affect    EKG:  Done today and reviewed by myself shows  AFib 90bpm  07/23/2021: TTE  1. Left ventricular ejection fraction, by estimation, is 50 to 55%. The  left ventricle has low normal function. The left ventricle has no regional  wall motion abnormalities. There is mild concentric left ventricular  hypertrophy. Left ventricular  diastolic parameters are indeterminate.   2. Right ventricular systolic function is normal. The right ventricular  size is normal. There is normal pulmonary artery systolic pressure. The  estimated right ventricular systolic pressure is 76.2 mmHg.   3. Left atrial size was severely dilated.   4. The mitral valve is normal in structure. Mild to moderate mitral valve  regurgitation. No evidence of mitral stenosis.   5. The aortic valve is tricuspid. There is mild thickening of the aortic  valve. Aortic valve regurgitation is mild to moderate.   6. Aortic dilatation noted. There is mild  dilatation of the ascending  aorta, measuring 43 mm.   7. The inferior vena cava is normal in size with greater than 50%  respiratory variability, suggesting right atrial pressure of 3 mmHg.   Comparison(s): Aortic Dilation increased from prior.    TTE 11/09/19  Review of the above records today demonstrates:   1. Mild global reduction in LV systolic function; grade 1 diastolic  dysfunction; right heart not well visualized.   2. Left ventricular ejection fraction, by estimation, is 45 to 50%. The  left ventricle has mildly decreased function. The left ventricle  demonstrates global hypokinesis. Left ventricular diastolic parameters are  consistent with Grade I diastolic  dysfunction (impaired relaxation). Elevated left atrial pressure.   3. Right ventricular systolic function is normal. The right ventricular  size is normal. Tricuspid regurgitation signal is inadequate for assessing  PA pressure.   4. Left atrial size was moderately dilated.   5. The mitral valve is normal in structure. Mild mitral valve  regurgitation. No evidence of mitral stenosis.   6. The aortic valve is tricuspid. Aortic valve regurgitation is mild. No  aortic stenosis is present.   7. Aortic dilatation noted. There is mild dilatation of the aortic root  measuring 39 mm.   8. The inferior vena cava is normal in size with greater than 50%  respiratory variability, suggesting right atrial pressure of 3 mmHg.    Cardiac monitor 02/15/2020 personally reviewed Max 174 bpm 06:30am, 10/25 Min 36 bpm 11:57pm, 11/03 Avg 61 bpm <1% ventricular ectopy 12.3% supraventricular ectopy Predominant underlying rhythm was sinus rhythm 5% atrial fibrillation burden 28 beat run of NSVT No symptoms recorded  Recent Labs: 07/22/2021: B Natriuretic Peptide 408.0; Magnesium 2.4 07/23/2021: TSH 3.311 08/18/2021: ALT 13; BUN 32; Creatinine, Ser 2.59; Hemoglobin 10.6; Platelets 420.0; Potassium 4.2; Sodium 139  02/12/2021:  Cholesterol 230; HDL 87.30; LDL Cholesterol 123; Total CHOL/HDL Ratio 3; Triglycerides 96.0; VLDL 19.2   CrCl cannot be calculated (Unknown ideal weight.).   Wt Readings from Last 3 Encounters:  08/18/21 151 lb 3.2 oz (68.6 kg)  08/01/21 169 lb (  76.7 kg)  06/22/21 161 lb (73 kg)     Other studies reviewed: Additional studies/records reviewed today include: summarized above  ASSESSMENT AND PLAN:  Paroxysmal AFib CHA2DS2Vasc is 7, on Eliquis, appropriately dosed for age/renal function Rate controlled She is unaware of the AFib   We discussed AFib management strategies. They would at this time without symptoms to keep a conservative strategy for rate control alone.     Chronic CHF (combined) Edema is creeping back in. I am a bit reluctant to push her diuretic without her nephrologist on board, they are appreciative and would like her to weigh in.  HTN No changes  Disposition: F/u with [place] in a few months, I will reach out to Dr. Royce Macadamia regarding recommendations for her diuretics.  Current medicines are reviewed at length with the patient today.  The patient did not have any concerns regarding medicines.  Venetia Night, PA-C 09/02/2021 6:47 PM     [address] [place] [id number] [phone] (office)  [phone] (fax)

## 2021-09-03 ENCOUNTER — Encounter: Payer: Self-pay | Admitting: Physician Assistant

## 2021-09-03 ENCOUNTER — Ambulatory Visit: Payer: Medicare Other | Admitting: Physician Assistant

## 2021-09-03 VITALS — BP 126/68 | HR 91 | Ht 65.0 in | Wt 163.4 lb

## 2021-09-03 DIAGNOSIS — I480 Paroxysmal atrial fibrillation: Secondary | ICD-10-CM | POA: Diagnosis not present

## 2021-09-03 NOTE — Patient Instructions (Signed)
Medication Instructions:   Your physician recommends that you continue on your current medications as directed. Please refer to the Current Medication list given to you today.  *If you need a refill on your cardiac medications before your next appointment, please call your pharmacy*   Lab Work: [place]   If you have labs (blood work) drawn today and your tests are completely normal, you will receive your results only by: [place] (if you have MyChart) OR A paper copy in the mail If you have any lab test that is abnormal or we need to change your treatment, we will call you to review the results.   Testing/Procedures: NONE ORDERED  TODAY   Follow-Up: At [facility], you and your health needs are our priority.  As part of our continuing mission to provide you with exceptional heart care, we have created designated Provider Care Teams.  These Care Teams include your primary Cardiologist (physician) and Advanced Practice Providers (APPs -  Physician Assistants and Nurse Practitioners) who all work together to provide you with the care you need, when you need it.  We recommend signing up for the patient portal called "MyChart".  Sign up information is provided on this After Visit Summary.  MyChart is used to connect with patients for Virtual Visits (Telemedicine).  Patients are able to view lab/test results, encounter notes, upcoming appointments, etc.  Non-urgent messages can be sent to your provider as well.   To learn more about what you can do with MyChart, go to [email or web address].    Your next appointment:   6 week(s)  The format for your next appointment:   In Person  Provider:   You may see Dr.Camnitz  or one of the following Advanced Practice Providers on your designated Care Team:   Tommye Standard, [place] Legrand Como "Jonni Sanger" Chalmers Cater, PA-C{   Other Instructions   Important Information About Sugar

## 2021-09-04 DIAGNOSIS — Z7901 Long term (current) use of anticoagulants: Secondary | ICD-10-CM | POA: Diagnosis not present

## 2021-09-04 DIAGNOSIS — Z8781 Personal history of (healed) traumatic fracture: Secondary | ICD-10-CM | POA: Diagnosis not present

## 2021-09-04 DIAGNOSIS — C44712 Basal cell carcinoma of skin of right lower limb, including hip: Secondary | ICD-10-CM | POA: Diagnosis not present

## 2021-09-04 DIAGNOSIS — G629 Polyneuropathy, unspecified: Secondary | ICD-10-CM | POA: Diagnosis not present

## 2021-09-04 DIAGNOSIS — G8929 Other chronic pain: Secondary | ICD-10-CM | POA: Diagnosis not present

## 2021-09-04 DIAGNOSIS — I130 Hypertensive heart and chronic kidney disease with heart failure and stage 1 through stage 4 chronic kidney disease, or unspecified chronic kidney disease: Secondary | ICD-10-CM | POA: Diagnosis not present

## 2021-09-04 DIAGNOSIS — M810 Age-related osteoporosis without current pathological fracture: Secondary | ICD-10-CM | POA: Diagnosis not present

## 2021-09-04 DIAGNOSIS — D631 Anemia in chronic kidney disease: Secondary | ICD-10-CM | POA: Diagnosis not present

## 2021-09-04 DIAGNOSIS — K5901 Slow transit constipation: Secondary | ICD-10-CM | POA: Diagnosis not present

## 2021-09-04 DIAGNOSIS — Z9181 History of falling: Secondary | ICD-10-CM | POA: Diagnosis not present

## 2021-09-04 DIAGNOSIS — D630 Anemia in neoplastic disease: Secondary | ICD-10-CM | POA: Diagnosis not present

## 2021-09-04 DIAGNOSIS — M545 Low back pain, unspecified: Secondary | ICD-10-CM | POA: Diagnosis not present

## 2021-09-04 DIAGNOSIS — I5041 Acute combined systolic (congestive) and diastolic (congestive) heart failure: Secondary | ICD-10-CM | POA: Diagnosis not present

## 2021-09-04 DIAGNOSIS — E785 Hyperlipidemia, unspecified: Secondary | ICD-10-CM | POA: Diagnosis not present

## 2021-09-04 DIAGNOSIS — M1990 Unspecified osteoarthritis, unspecified site: Secondary | ICD-10-CM | POA: Diagnosis not present

## 2021-09-04 DIAGNOSIS — N184 Chronic kidney disease, stage 4 (severe): Secondary | ICD-10-CM | POA: Diagnosis not present

## 2021-09-04 DIAGNOSIS — K219 Gastro-esophageal reflux disease without esophagitis: Secondary | ICD-10-CM | POA: Diagnosis not present

## 2021-09-04 DIAGNOSIS — I480 Paroxysmal atrial fibrillation: Secondary | ICD-10-CM | POA: Diagnosis not present

## 2021-09-04 DIAGNOSIS — E538 Deficiency of other specified B group vitamins: Secondary | ICD-10-CM | POA: Diagnosis not present

## 2021-09-04 DIAGNOSIS — K5790 Diverticulosis of intestine, part unspecified, without perforation or abscess without bleeding: Secondary | ICD-10-CM | POA: Diagnosis not present

## 2021-09-05 ENCOUNTER — Encounter: Payer: Self-pay | Admitting: Family Medicine

## 2021-09-07 ENCOUNTER — Other Ambulatory Visit: Payer: Self-pay | Admitting: Family Medicine

## 2021-09-07 DIAGNOSIS — I480 Paroxysmal atrial fibrillation: Secondary | ICD-10-CM | POA: Diagnosis not present

## 2021-09-07 DIAGNOSIS — E785 Hyperlipidemia, unspecified: Secondary | ICD-10-CM | POA: Diagnosis not present

## 2021-09-07 DIAGNOSIS — I130 Hypertensive heart and chronic kidney disease with heart failure and stage 1 through stage 4 chronic kidney disease, or unspecified chronic kidney disease: Secondary | ICD-10-CM | POA: Diagnosis not present

## 2021-09-07 DIAGNOSIS — I5041 Acute combined systolic (congestive) and diastolic (congestive) heart failure: Secondary | ICD-10-CM | POA: Diagnosis not present

## 2021-09-07 DIAGNOSIS — G629 Polyneuropathy, unspecified: Secondary | ICD-10-CM | POA: Diagnosis not present

## 2021-09-07 DIAGNOSIS — Z8781 Personal history of (healed) traumatic fracture: Secondary | ICD-10-CM | POA: Diagnosis not present

## 2021-09-07 DIAGNOSIS — K5901 Slow transit constipation: Secondary | ICD-10-CM | POA: Diagnosis not present

## 2021-09-07 DIAGNOSIS — M1990 Unspecified osteoarthritis, unspecified site: Secondary | ICD-10-CM | POA: Diagnosis not present

## 2021-09-07 DIAGNOSIS — Z9181 History of falling: Secondary | ICD-10-CM | POA: Diagnosis not present

## 2021-09-07 DIAGNOSIS — M810 Age-related osteoporosis without current pathological fracture: Secondary | ICD-10-CM | POA: Diagnosis not present

## 2021-09-07 DIAGNOSIS — Z7901 Long term (current) use of anticoagulants: Secondary | ICD-10-CM | POA: Diagnosis not present

## 2021-09-07 DIAGNOSIS — G8929 Other chronic pain: Secondary | ICD-10-CM | POA: Diagnosis not present

## 2021-09-07 DIAGNOSIS — D631 Anemia in chronic kidney disease: Secondary | ICD-10-CM | POA: Diagnosis not present

## 2021-09-07 DIAGNOSIS — C44712 Basal cell carcinoma of skin of right lower limb, including hip: Secondary | ICD-10-CM | POA: Diagnosis not present

## 2021-09-07 DIAGNOSIS — K5790 Diverticulosis of intestine, part unspecified, without perforation or abscess without bleeding: Secondary | ICD-10-CM | POA: Diagnosis not present

## 2021-09-07 DIAGNOSIS — M545 Low back pain, unspecified: Secondary | ICD-10-CM | POA: Diagnosis not present

## 2021-09-07 DIAGNOSIS — D630 Anemia in neoplastic disease: Secondary | ICD-10-CM | POA: Diagnosis not present

## 2021-09-07 DIAGNOSIS — E538 Deficiency of other specified B group vitamins: Secondary | ICD-10-CM | POA: Diagnosis not present

## 2021-09-07 DIAGNOSIS — N184 Chronic kidney disease, stage 4 (severe): Secondary | ICD-10-CM | POA: Diagnosis not present

## 2021-09-07 DIAGNOSIS — K219 Gastro-esophageal reflux disease without esophagitis: Secondary | ICD-10-CM | POA: Diagnosis not present

## 2021-09-07 MED ORDER — FERROUS GLUCONATE 324 (38 FE) MG PO TABS: 324.0000 mg | ORAL_TABLET | Freq: Every day | ORAL | 3 refills | Status: DC

## 2021-09-10 ENCOUNTER — Ambulatory Visit: Payer: Medicare Other | Admitting: Physician Assistant

## 2021-09-11 DIAGNOSIS — I130 Hypertensive heart and chronic kidney disease with heart failure and stage 1 through stage 4 chronic kidney disease, or unspecified chronic kidney disease: Secondary | ICD-10-CM | POA: Diagnosis not present

## 2021-09-11 DIAGNOSIS — E538 Deficiency of other specified B group vitamins: Secondary | ICD-10-CM | POA: Diagnosis not present

## 2021-09-11 DIAGNOSIS — Z7901 Long term (current) use of anticoagulants: Secondary | ICD-10-CM | POA: Diagnosis not present

## 2021-09-11 DIAGNOSIS — D630 Anemia in neoplastic disease: Secondary | ICD-10-CM | POA: Diagnosis not present

## 2021-09-11 DIAGNOSIS — D631 Anemia in chronic kidney disease: Secondary | ICD-10-CM | POA: Diagnosis not present

## 2021-09-11 DIAGNOSIS — Z9181 History of falling: Secondary | ICD-10-CM | POA: Diagnosis not present

## 2021-09-11 DIAGNOSIS — C44712 Basal cell carcinoma of skin of right lower limb, including hip: Secondary | ICD-10-CM | POA: Diagnosis not present

## 2021-09-11 DIAGNOSIS — K219 Gastro-esophageal reflux disease without esophagitis: Secondary | ICD-10-CM | POA: Diagnosis not present

## 2021-09-11 DIAGNOSIS — Z8781 Personal history of (healed) traumatic fracture: Secondary | ICD-10-CM | POA: Diagnosis not present

## 2021-09-11 DIAGNOSIS — G629 Polyneuropathy, unspecified: Secondary | ICD-10-CM | POA: Diagnosis not present

## 2021-09-11 DIAGNOSIS — N184 Chronic kidney disease, stage 4 (severe): Secondary | ICD-10-CM | POA: Diagnosis not present

## 2021-09-11 DIAGNOSIS — M810 Age-related osteoporosis without current pathological fracture: Secondary | ICD-10-CM | POA: Diagnosis not present

## 2021-09-11 DIAGNOSIS — G8929 Other chronic pain: Secondary | ICD-10-CM | POA: Diagnosis not present

## 2021-09-11 DIAGNOSIS — I480 Paroxysmal atrial fibrillation: Secondary | ICD-10-CM | POA: Diagnosis not present

## 2021-09-11 DIAGNOSIS — I5041 Acute combined systolic (congestive) and diastolic (congestive) heart failure: Secondary | ICD-10-CM | POA: Diagnosis not present

## 2021-09-11 DIAGNOSIS — E785 Hyperlipidemia, unspecified: Secondary | ICD-10-CM | POA: Diagnosis not present

## 2021-09-11 DIAGNOSIS — K5790 Diverticulosis of intestine, part unspecified, without perforation or abscess without bleeding: Secondary | ICD-10-CM | POA: Diagnosis not present

## 2021-09-11 DIAGNOSIS — M545 Low back pain, unspecified: Secondary | ICD-10-CM | POA: Diagnosis not present

## 2021-09-11 DIAGNOSIS — M1990 Unspecified osteoarthritis, unspecified site: Secondary | ICD-10-CM | POA: Diagnosis not present

## 2021-09-11 DIAGNOSIS — K5901 Slow transit constipation: Secondary | ICD-10-CM | POA: Diagnosis not present

## 2021-09-14 DIAGNOSIS — E785 Hyperlipidemia, unspecified: Secondary | ICD-10-CM | POA: Diagnosis not present

## 2021-09-14 DIAGNOSIS — K5901 Slow transit constipation: Secondary | ICD-10-CM | POA: Diagnosis not present

## 2021-09-14 DIAGNOSIS — D631 Anemia in chronic kidney disease: Secondary | ICD-10-CM | POA: Diagnosis not present

## 2021-09-14 DIAGNOSIS — Z9181 History of falling: Secondary | ICD-10-CM | POA: Diagnosis not present

## 2021-09-14 DIAGNOSIS — G629 Polyneuropathy, unspecified: Secondary | ICD-10-CM | POA: Diagnosis not present

## 2021-09-14 DIAGNOSIS — I5041 Acute combined systolic (congestive) and diastolic (congestive) heart failure: Secondary | ICD-10-CM | POA: Diagnosis not present

## 2021-09-14 DIAGNOSIS — N184 Chronic kidney disease, stage 4 (severe): Secondary | ICD-10-CM | POA: Diagnosis not present

## 2021-09-14 DIAGNOSIS — I480 Paroxysmal atrial fibrillation: Secondary | ICD-10-CM | POA: Diagnosis not present

## 2021-09-14 DIAGNOSIS — M810 Age-related osteoporosis without current pathological fracture: Secondary | ICD-10-CM | POA: Diagnosis not present

## 2021-09-14 DIAGNOSIS — D630 Anemia in neoplastic disease: Secondary | ICD-10-CM | POA: Diagnosis not present

## 2021-09-14 DIAGNOSIS — Z7901 Long term (current) use of anticoagulants: Secondary | ICD-10-CM | POA: Diagnosis not present

## 2021-09-14 DIAGNOSIS — I130 Hypertensive heart and chronic kidney disease with heart failure and stage 1 through stage 4 chronic kidney disease, or unspecified chronic kidney disease: Secondary | ICD-10-CM | POA: Diagnosis not present

## 2021-09-14 DIAGNOSIS — M545 Low back pain, unspecified: Secondary | ICD-10-CM | POA: Diagnosis not present

## 2021-09-14 DIAGNOSIS — K219 Gastro-esophageal reflux disease without esophagitis: Secondary | ICD-10-CM | POA: Diagnosis not present

## 2021-09-14 DIAGNOSIS — K5790 Diverticulosis of intestine, part unspecified, without perforation or abscess without bleeding: Secondary | ICD-10-CM | POA: Diagnosis not present

## 2021-09-14 DIAGNOSIS — E538 Deficiency of other specified B group vitamins: Secondary | ICD-10-CM | POA: Diagnosis not present

## 2021-09-14 DIAGNOSIS — M1990 Unspecified osteoarthritis, unspecified site: Secondary | ICD-10-CM | POA: Diagnosis not present

## 2021-09-14 DIAGNOSIS — C44712 Basal cell carcinoma of skin of right lower limb, including hip: Secondary | ICD-10-CM | POA: Diagnosis not present

## 2021-09-14 DIAGNOSIS — G8929 Other chronic pain: Secondary | ICD-10-CM | POA: Diagnosis not present

## 2021-09-14 DIAGNOSIS — Z8781 Personal history of (healed) traumatic fracture: Secondary | ICD-10-CM | POA: Diagnosis not present

## 2021-09-15 ENCOUNTER — Telehealth: Payer: Self-pay | Admitting: *Deleted

## 2021-09-15 NOTE — Telephone Encounter (Signed)
First Attempted to contact patient 10;36  to follow up with swelling and neurology and no answer. Contacted husband on cell phone who stated his wife sleeps in late to about 11:30 -95  and will not answer until then but has been doing okay and hasn't stated any issues of any sort.  Patient has appointment with neurologist 10-05-21 already setup.  Attempted at 11.25 to talk to patient no answer.

## 2021-09-16 NOTE — Telephone Encounter (Signed)
Spoke with patient and stated she was doing okay and do not have any major issues to discuss. Patient stated her swelling has been dong better and she only Taking 40 mg tablet daily because that's all she can handle. Patient aware of appointment with neurologist on 7-5-at 2:45 p

## 2021-09-17 DIAGNOSIS — M1990 Unspecified osteoarthritis, unspecified site: Secondary | ICD-10-CM | POA: Diagnosis not present

## 2021-09-17 DIAGNOSIS — E538 Deficiency of other specified B group vitamins: Secondary | ICD-10-CM | POA: Diagnosis not present

## 2021-09-17 DIAGNOSIS — K5901 Slow transit constipation: Secondary | ICD-10-CM | POA: Diagnosis not present

## 2021-09-17 DIAGNOSIS — D631 Anemia in chronic kidney disease: Secondary | ICD-10-CM | POA: Diagnosis not present

## 2021-09-17 DIAGNOSIS — Z8781 Personal history of (healed) traumatic fracture: Secondary | ICD-10-CM | POA: Diagnosis not present

## 2021-09-17 DIAGNOSIS — K5790 Diverticulosis of intestine, part unspecified, without perforation or abscess without bleeding: Secondary | ICD-10-CM | POA: Diagnosis not present

## 2021-09-17 DIAGNOSIS — M810 Age-related osteoporosis without current pathological fracture: Secondary | ICD-10-CM | POA: Diagnosis not present

## 2021-09-17 DIAGNOSIS — G629 Polyneuropathy, unspecified: Secondary | ICD-10-CM | POA: Diagnosis not present

## 2021-09-17 DIAGNOSIS — N184 Chronic kidney disease, stage 4 (severe): Secondary | ICD-10-CM | POA: Diagnosis not present

## 2021-09-17 DIAGNOSIS — Z7901 Long term (current) use of anticoagulants: Secondary | ICD-10-CM | POA: Diagnosis not present

## 2021-09-17 DIAGNOSIS — I5041 Acute combined systolic (congestive) and diastolic (congestive) heart failure: Secondary | ICD-10-CM | POA: Diagnosis not present

## 2021-09-17 DIAGNOSIS — E785 Hyperlipidemia, unspecified: Secondary | ICD-10-CM | POA: Diagnosis not present

## 2021-09-17 DIAGNOSIS — Z9181 History of falling: Secondary | ICD-10-CM | POA: Diagnosis not present

## 2021-09-17 DIAGNOSIS — M545 Low back pain, unspecified: Secondary | ICD-10-CM | POA: Diagnosis not present

## 2021-09-17 DIAGNOSIS — K219 Gastro-esophageal reflux disease without esophagitis: Secondary | ICD-10-CM | POA: Diagnosis not present

## 2021-09-17 DIAGNOSIS — G8929 Other chronic pain: Secondary | ICD-10-CM | POA: Diagnosis not present

## 2021-09-17 DIAGNOSIS — D630 Anemia in neoplastic disease: Secondary | ICD-10-CM | POA: Diagnosis not present

## 2021-09-17 DIAGNOSIS — I480 Paroxysmal atrial fibrillation: Secondary | ICD-10-CM | POA: Diagnosis not present

## 2021-09-17 DIAGNOSIS — C44712 Basal cell carcinoma of skin of right lower limb, including hip: Secondary | ICD-10-CM | POA: Diagnosis not present

## 2021-09-17 DIAGNOSIS — I130 Hypertensive heart and chronic kidney disease with heart failure and stage 1 through stage 4 chronic kidney disease, or unspecified chronic kidney disease: Secondary | ICD-10-CM | POA: Diagnosis not present

## 2021-09-23 DIAGNOSIS — M545 Low back pain, unspecified: Secondary | ICD-10-CM | POA: Diagnosis not present

## 2021-09-23 DIAGNOSIS — E538 Deficiency of other specified B group vitamins: Secondary | ICD-10-CM | POA: Diagnosis not present

## 2021-09-23 DIAGNOSIS — I5041 Acute combined systolic (congestive) and diastolic (congestive) heart failure: Secondary | ICD-10-CM | POA: Diagnosis not present

## 2021-09-23 DIAGNOSIS — G8929 Other chronic pain: Secondary | ICD-10-CM | POA: Diagnosis not present

## 2021-09-23 DIAGNOSIS — K219 Gastro-esophageal reflux disease without esophagitis: Secondary | ICD-10-CM | POA: Diagnosis not present

## 2021-09-23 DIAGNOSIS — M1990 Unspecified osteoarthritis, unspecified site: Secondary | ICD-10-CM | POA: Diagnosis not present

## 2021-09-23 DIAGNOSIS — K5901 Slow transit constipation: Secondary | ICD-10-CM | POA: Diagnosis not present

## 2021-09-23 DIAGNOSIS — I480 Paroxysmal atrial fibrillation: Secondary | ICD-10-CM | POA: Diagnosis not present

## 2021-09-23 DIAGNOSIS — Z7901 Long term (current) use of anticoagulants: Secondary | ICD-10-CM | POA: Diagnosis not present

## 2021-09-23 DIAGNOSIS — Z8781 Personal history of (healed) traumatic fracture: Secondary | ICD-10-CM | POA: Diagnosis not present

## 2021-09-23 DIAGNOSIS — I130 Hypertensive heart and chronic kidney disease with heart failure and stage 1 through stage 4 chronic kidney disease, or unspecified chronic kidney disease: Secondary | ICD-10-CM | POA: Diagnosis not present

## 2021-09-23 DIAGNOSIS — K5790 Diverticulosis of intestine, part unspecified, without perforation or abscess without bleeding: Secondary | ICD-10-CM | POA: Diagnosis not present

## 2021-09-23 DIAGNOSIS — G629 Polyneuropathy, unspecified: Secondary | ICD-10-CM | POA: Diagnosis not present

## 2021-09-23 DIAGNOSIS — M810 Age-related osteoporosis without current pathological fracture: Secondary | ICD-10-CM | POA: Diagnosis not present

## 2021-09-23 DIAGNOSIS — D630 Anemia in neoplastic disease: Secondary | ICD-10-CM | POA: Diagnosis not present

## 2021-09-23 DIAGNOSIS — E785 Hyperlipidemia, unspecified: Secondary | ICD-10-CM | POA: Diagnosis not present

## 2021-09-23 DIAGNOSIS — C44712 Basal cell carcinoma of skin of right lower limb, including hip: Secondary | ICD-10-CM | POA: Diagnosis not present

## 2021-09-23 DIAGNOSIS — D631 Anemia in chronic kidney disease: Secondary | ICD-10-CM | POA: Diagnosis not present

## 2021-09-23 DIAGNOSIS — Z9181 History of falling: Secondary | ICD-10-CM | POA: Diagnosis not present

## 2021-09-23 DIAGNOSIS — N184 Chronic kidney disease, stage 4 (severe): Secondary | ICD-10-CM | POA: Diagnosis not present

## 2021-09-30 ENCOUNTER — Encounter: Payer: Self-pay | Admitting: Cardiology

## 2021-10-01 DIAGNOSIS — E538 Deficiency of other specified B group vitamins: Secondary | ICD-10-CM | POA: Diagnosis not present

## 2021-10-01 DIAGNOSIS — K5901 Slow transit constipation: Secondary | ICD-10-CM | POA: Diagnosis not present

## 2021-10-01 DIAGNOSIS — Z9181 History of falling: Secondary | ICD-10-CM | POA: Diagnosis not present

## 2021-10-01 DIAGNOSIS — C44712 Basal cell carcinoma of skin of right lower limb, including hip: Secondary | ICD-10-CM | POA: Diagnosis not present

## 2021-10-01 DIAGNOSIS — G629 Polyneuropathy, unspecified: Secondary | ICD-10-CM | POA: Diagnosis not present

## 2021-10-01 DIAGNOSIS — Z8781 Personal history of (healed) traumatic fracture: Secondary | ICD-10-CM | POA: Diagnosis not present

## 2021-10-01 DIAGNOSIS — M545 Low back pain, unspecified: Secondary | ICD-10-CM | POA: Diagnosis not present

## 2021-10-01 DIAGNOSIS — E785 Hyperlipidemia, unspecified: Secondary | ICD-10-CM | POA: Diagnosis not present

## 2021-10-01 DIAGNOSIS — I480 Paroxysmal atrial fibrillation: Secondary | ICD-10-CM | POA: Diagnosis not present

## 2021-10-01 DIAGNOSIS — K219 Gastro-esophageal reflux disease without esophagitis: Secondary | ICD-10-CM | POA: Diagnosis not present

## 2021-10-01 DIAGNOSIS — M810 Age-related osteoporosis without current pathological fracture: Secondary | ICD-10-CM | POA: Diagnosis not present

## 2021-10-01 DIAGNOSIS — D631 Anemia in chronic kidney disease: Secondary | ICD-10-CM | POA: Diagnosis not present

## 2021-10-01 DIAGNOSIS — M1990 Unspecified osteoarthritis, unspecified site: Secondary | ICD-10-CM | POA: Diagnosis not present

## 2021-10-01 DIAGNOSIS — I130 Hypertensive heart and chronic kidney disease with heart failure and stage 1 through stage 4 chronic kidney disease, or unspecified chronic kidney disease: Secondary | ICD-10-CM | POA: Diagnosis not present

## 2021-10-01 DIAGNOSIS — I5041 Acute combined systolic (congestive) and diastolic (congestive) heart failure: Secondary | ICD-10-CM | POA: Diagnosis not present

## 2021-10-01 DIAGNOSIS — N184 Chronic kidney disease, stage 4 (severe): Secondary | ICD-10-CM | POA: Diagnosis not present

## 2021-10-01 DIAGNOSIS — K5790 Diverticulosis of intestine, part unspecified, without perforation or abscess without bleeding: Secondary | ICD-10-CM | POA: Diagnosis not present

## 2021-10-01 DIAGNOSIS — D630 Anemia in neoplastic disease: Secondary | ICD-10-CM | POA: Diagnosis not present

## 2021-10-01 DIAGNOSIS — G8929 Other chronic pain: Secondary | ICD-10-CM | POA: Diagnosis not present

## 2021-10-01 DIAGNOSIS — Z7901 Long term (current) use of anticoagulants: Secondary | ICD-10-CM | POA: Diagnosis not present

## 2021-10-01 NOTE — Telephone Encounter (Signed)
Spoke to pt and her husband. Aware I will discuss this with provider, Tommye Standard, who pt saw earlier this month. Informed that if she feels I should discuss with Camnitz, then it would be next week before return call on this matter. They are agreeable to plan.

## 2021-10-05 DIAGNOSIS — D630 Anemia in neoplastic disease: Secondary | ICD-10-CM | POA: Diagnosis not present

## 2021-10-05 DIAGNOSIS — N184 Chronic kidney disease, stage 4 (severe): Secondary | ICD-10-CM | POA: Diagnosis not present

## 2021-10-05 DIAGNOSIS — K219 Gastro-esophageal reflux disease without esophagitis: Secondary | ICD-10-CM | POA: Diagnosis not present

## 2021-10-05 DIAGNOSIS — Z9181 History of falling: Secondary | ICD-10-CM | POA: Diagnosis not present

## 2021-10-05 DIAGNOSIS — K5790 Diverticulosis of intestine, part unspecified, without perforation or abscess without bleeding: Secondary | ICD-10-CM | POA: Diagnosis not present

## 2021-10-05 DIAGNOSIS — M545 Low back pain, unspecified: Secondary | ICD-10-CM | POA: Diagnosis not present

## 2021-10-05 DIAGNOSIS — M1990 Unspecified osteoarthritis, unspecified site: Secondary | ICD-10-CM | POA: Diagnosis not present

## 2021-10-05 DIAGNOSIS — E538 Deficiency of other specified B group vitamins: Secondary | ICD-10-CM | POA: Diagnosis not present

## 2021-10-05 DIAGNOSIS — Z8781 Personal history of (healed) traumatic fracture: Secondary | ICD-10-CM | POA: Diagnosis not present

## 2021-10-05 DIAGNOSIS — I5041 Acute combined systolic (congestive) and diastolic (congestive) heart failure: Secondary | ICD-10-CM | POA: Diagnosis not present

## 2021-10-05 DIAGNOSIS — K5901 Slow transit constipation: Secondary | ICD-10-CM | POA: Diagnosis not present

## 2021-10-05 DIAGNOSIS — M810 Age-related osteoporosis without current pathological fracture: Secondary | ICD-10-CM | POA: Diagnosis not present

## 2021-10-05 DIAGNOSIS — E785 Hyperlipidemia, unspecified: Secondary | ICD-10-CM | POA: Diagnosis not present

## 2021-10-05 DIAGNOSIS — C44712 Basal cell carcinoma of skin of right lower limb, including hip: Secondary | ICD-10-CM | POA: Diagnosis not present

## 2021-10-05 DIAGNOSIS — I130 Hypertensive heart and chronic kidney disease with heart failure and stage 1 through stage 4 chronic kidney disease, or unspecified chronic kidney disease: Secondary | ICD-10-CM | POA: Diagnosis not present

## 2021-10-05 DIAGNOSIS — G629 Polyneuropathy, unspecified: Secondary | ICD-10-CM | POA: Diagnosis not present

## 2021-10-05 DIAGNOSIS — Z7901 Long term (current) use of anticoagulants: Secondary | ICD-10-CM | POA: Diagnosis not present

## 2021-10-05 DIAGNOSIS — D631 Anemia in chronic kidney disease: Secondary | ICD-10-CM | POA: Diagnosis not present

## 2021-10-05 DIAGNOSIS — G8929 Other chronic pain: Secondary | ICD-10-CM | POA: Diagnosis not present

## 2021-10-05 DIAGNOSIS — I480 Paroxysmal atrial fibrillation: Secondary | ICD-10-CM | POA: Diagnosis not present

## 2021-10-07 ENCOUNTER — Telehealth: Payer: Medicare Other | Admitting: Neurology

## 2021-10-07 DIAGNOSIS — G609 Hereditary and idiopathic neuropathy, unspecified: Secondary | ICD-10-CM

## 2021-10-07 NOTE — Progress Notes (Signed)
   Virtual Visit via Video Note  I connected with [name] on 10/07/21 at  2:45 PM EDT by a video enabled telemedicine application and verified that I am speaking with the correct person using two identifiers.  Location: Patient: at her home Provider: in the office    I discussed the limitations of evaluation and management by telemedicine and the availability of in person appointments. The patient expressed understanding and agreed to proceed.  History of Present Illness: 10/07/21 SS: [name] here today for follow-up virtually.  Was able to come off nortriptyline without adverse effect or noticeable difference.  Remains on Lyrica 50 mg at bedtime.  Does think this is helpful for her neuropathy and low back pain.  She has had no falls.  Continues to use a walker.  Hospitalized back in April for weakness had CHF exacerbation, A-fib.  Is on Eliquis.  04/09/21 SS: [name] here today for follow-up with history of peripheral neuropathy and low back pain.  Is on brand-name Lyrica and nortriptyline for her symptoms. Sleeps well, has daytime drowsiness. Has cold sensation in feet at night, nothing during the day. 1 fall, when her leg got hung on the bathtub about 2 months ago, wasn't injured. PCP is doing B 12 injections. Takes Xanax PRN,  but not daily. Uses rolling walker at home. Her husband brought her today.   Observations/Objective: Via virtual visit, is alert and oriented, speech is clear, concise, her husband is present, ambulates with walker, is slow, cautious  Assessment and Plan: 1.  Peripheral neuropathy 2.  Gait instability 3.  Chronic low back pain -Symptoms stable, can continue brand-name Lyrica 50 mg at bedtime, claims generic was not effective -Will remain off nortriptyline -Continue follow-up with PCP, return here as needed, Lyrica was refilled in May for 6 months, will reach out to PCP to ensure agreeable to assume refills going forward  Follow Up  Instructions: As needed   I discussed the assessment and treatment plan with the patient. The patient was provided an opportunity to ask questions and all were answered. The patient agreed with the plan and demonstrated an understanding of the instructions.   The patient was advised to call back or seek an in-person evaluation if the symptoms worsen or if the condition fails to improve as anticipated.  Evangeline Dakin, DNP  [facility] [address], [place], [place] [address] [phone]

## 2021-10-11 ENCOUNTER — Other Ambulatory Visit: Payer: Self-pay | Admitting: Family Medicine

## 2021-10-11 DIAGNOSIS — I10 Essential (primary) hypertension: Secondary | ICD-10-CM

## 2021-10-13 NOTE — Telephone Encounter (Signed)
Left message to call back  

## 2021-10-14 ENCOUNTER — Encounter: Payer: Self-pay | Admitting: Cardiology

## 2021-10-14 ENCOUNTER — Ambulatory Visit: Payer: Medicare Other | Admitting: Cardiology

## 2021-10-14 VITALS — BP 104/68 | HR 101 | Ht 65.0 in | Wt 156.6 lb

## 2021-10-14 DIAGNOSIS — I4819 Other persistent atrial fibrillation: Secondary | ICD-10-CM

## 2021-10-14 NOTE — Patient Instructions (Signed)
Medication Instructions:  Your physician has recommended you make the following change in your medication:  STOP Diltiazem  *If you need a refill on your cardiac medications before your next appointment, please call your pharmacy*   Lab Work: None ordered   Testing/Procedures: None ordered   Follow-Up: At [facility], you and your health needs are our priority.  As part of our continuing mission to provide you with exceptional heart care, we have created designated Provider Care Teams.  These Care Teams include your primary Cardiologist (physician) and Advanced Practice Providers (APPs -  Physician Assistants and Nurse Practitioners) who all work together to provide you with the care you need, when you need it.  Your next appointment:   6 month(s)  The format for your next appointment:   In Person  Provider:   You will see one of the following Advanced Practice Providers on your designated Care Team:   Tommye Standard, [place] Legrand Como "Jonni Sanger" Chalmers Cater, [place]      Thank you for choosing [facility]!!   Trinidad Curet, RN [phone]  Other Instructions   Important Information About Sugar

## 2021-10-14 NOTE — Progress Notes (Signed)
Electrophysiology Office Note   Date:  10/14/2021   ID:  Krystal Henderson, DOB April 13, 1932, MRN [id number]  PCP:  Carollee Herter, Alferd Apa, DO  Cardiologist:   Primary Electrophysiologist:  Destynee Stringfellow Meredith Leeds, MD    Chief Complaint: Atrial fibrillation   History of Present Illness: Krystal Henderson is a 88 y.o. female who is being seen today for the evaluation of atrial fibrillation at the request of Ann Held, *. Presenting today for electrophysiology evaluation.  She has a history seen for hypertension and hyperlipidemia.  She was seen by her primary physician and was noted to have atrial fibrillation.  She was on Eliquis for a prior PE.  She was hospitalized 08/11/2019 with shortness of breath and was found to have a pulmonary embolism.  She was started on metoprolol for her atrial fibrillation but had episodes of fatigue.  She was switched to diltiazem.  Today, denies symptoms of palpitations, chest pain, shortness of breath, orthopnea, PND, claudication, dizziness, presyncope, syncope, bleeding, or neurologic sequela. The patient is tolerating medications without difficulties.  Fortunately she has had a compression fracture of her spine.  She spoke with surgery, but due to the complexity of the procedure and rehab, it was thought that she would not be a good candidate.  She continues to have quite a bit of pain, but otherwise no major complaint.  She does have lower extremity edema that was better when she was in the hospital but has since worsened.  Past Medical History:  Diagnosis Date   Anemia    hx of    Angiodysplasia 2007   @ colonoscopy   Anxiety    PMH of   Chronic low back pain 06/21/2014   Diverticulosis of colon (without mention of hemorrhage)    DJD (degenerative joint disease)    Esophageal reflux    inactive   History of vertebral fracture 11/2015   HTN (hypertension)    Hyperlipemia 2006   LDL 130   Hypoglycemia    reactive   Pelvic fracture ([place])  12/30/2011   [facility]   Peripheral neuropathy    Personal history of colonic polyps    adenomatous   Vitamin B12 deficiency    Past Surgical History:  Procedure Laterality Date   cataract surgery  12-26-12 and 01-09-13   COLONOSCOPY  2011   neg   COLONOSCOPY W/ POLYPECTOMY     X 2 , Dr  Verl Blalock; angiodysplasia. Due 2022   DILATION AND CURETTAGE OF UTERUS     FACIAL COSMETIC SURGERY     HEMORROIDECTOMY     LUMBAR LAMINECTOMY/DECOMPRESSION MICRODISCECTOMY  09/10/2011   Procedure: LUMBAR LAMINECTOMY/DECOMPRESSION MICRODISCECTOMY;  Surgeon: Johnn Hai, MD;  Location: [facility];  Service: Orthopedics;  Laterality: N/A;  decompression laminectomy L2-3, L3-4, L4-5   ORIF WRIST FRACTURE  01/11/2012   Procedure: OPEN REDUCTION INTERNAL FIXATION (ORIF) WRIST FRACTURE;  Surgeon: Tennis Must, MD;  Location: [facility];  Service: Orthopedics;  Laterality: Right;   TEAR DUCT PROBING     X 2   TOTAL HIP ARTHROPLASTY  2000   right   TUBAL LIGATION       Current Outpatient Medications  Medication Sig Dispense Refill   acetaminophen (TYLENOL) 325 MG tablet Take 2 tablets (650 mg total) by mouth every 6 (six) hours as needed for mild pain (or Fever >/= 101).     Ascorbic Acid (VITAMIN C) 1000 MG tablet Take 1,000 mg by mouth daily.  Calcium Carbonate (CALCIUM 600 PO) Take 600 mg by mouth daily.     Cholecalciferol (VITAMIN D) 2000 UNITS CAPS Take 2,000 Units by mouth daily.     ELIQUIS 2.5 MG TABS tablet TAKE 1 TABLET(2.5 MG) BY MOUTH TWICE DAILY (Patient taking differently: Take 2.5 mg by mouth 2 (two) times daily.) 60 tablet 5   ferrous gluconate (FERGON) 324 MG tablet Take 1 tablet (324 mg total) by mouth daily. 90 tablet 3   folic acid (FOLVITE) 1 MG tablet Take 1 tablet (1 mg total) by mouth daily.  3   furosemide (LASIX) 40 MG tablet Take 1 tablet (40 mg total) by mouth daily. (Patient taking differently: Take 20 mg by mouth daily.) 90 tablet 3   hydrOXYzine  (ATARAX) 10 MG tablet Take 1 tablet (10 mg total) by mouth 3 (three) times daily as needed for anxiety. 30 tablet 0   pantoprazole (PROTONIX) 40 MG tablet Take 1 tablet (40 mg total) by mouth daily.     Polyethyl Glycol-Propyl Glycol (SYSTANE ULTRA OP) Place 1 drop into both eyes daily as needed (for dry eyes).     polyethylene glycol (MIRALAX / GLYCOLAX) 17 g packet Take 17 g by mouth 2 (two) times daily. 14 each 0   pregabalin (LYRICA) 50 MG capsule Take 1 capsule (50 mg total) by mouth at bedtime. 30 capsule 5   senna-docusate (SENOKOT-S) 8.6-50 MG tablet Take 1 tablet by mouth 2 (two) times daily.     thiamine 100 MG tablet Take 1 tablet (100 mg total) by mouth daily.     vitamin B-12 (CYANOCOBALAMIN) 1000 MCG tablet Take 1 tablet (1,000 mcg total) by mouth daily.     No current facility-administered medications for this visit.    Allergies:   Codeine, Duloxetine, and Sertraline hcl   Social History:  The patient  reports that she has never smoked. She has never used smokeless tobacco. She reports current alcohol use of about 3.0 standard drinks of alcohol per week. She reports that she does not use drugs.   Family History:  The patient's family history includes Cancer in her brother; Depression in her maternal uncle; Heart attack (age of onset: 45) in her brother; Heart attack (age of onset: 100) in her father; Kidney disease in her mother; Osteoporosis in her sister; Peripheral vascular disease in her daughter; Stroke (age of onset: 4) in her brother; Throat cancer in her sister.   ROS:  Please see the history of present illness.   Otherwise, review of systems is positive for none.   All other systems are reviewed and negative.   PHYSICAL EXAM: VS:  BP 104/68   Pulse (!) 101   Ht '5\' 5"'$  (1.651 m)   Wt 156 lb 9.6 oz (71 kg)   SpO2 96%   BMI 26.06 kg/m  , BMI Body mass index is 26.06 kg/m. GEN: Well nourished, well developed, in no acute distress  HEENT: normal  Neck: no JVD,  carotid bruits, or masses Cardiac: irregular; no murmurs, rubs, or gallops, 2+edema  Respiratory:  clear to auscultation bilaterally, normal work of breathing GI: soft, nontender, nondistended, + BS MS: no deformity or atrophy  Skin: warm and dry Neuro:  Strength and sensation are intact Psych: euthymic mood, full affect  EKG:  EKG is not ordered today. Personal review of the ekg ordered 09/03/21 shows atrial fibrillation  Recent Labs: 07/22/2021: B Natriuretic Peptide 408.0; Magnesium 2.4 07/23/2021: TSH 3.311 08/18/2021: ALT 13; BUN 32; Creatinine, Ser 2.59;  Hemoglobin 10.6; Platelets 420.0; Potassium 4.2; Sodium 139    Lipid Panel     Component Value Date/Time   CHOL 230 (H) 02/12/2021 1448   TRIG 96.0 02/12/2021 1448   HDL 87.30 02/12/2021 1448   CHOLHDL 3 02/12/2021 1448   VLDL 19.2 02/12/2021 1448   LDLCALC 123 (H) 02/12/2021 1448   LDLCALC 109 (H) 01/03/2020 1605   LDLDIRECT 129.0 10/18/2014 1156     Wt Readings from Last 3 Encounters:  10/14/21 156 lb 9.6 oz (71 kg)  09/03/21 163 lb 6.4 oz (74.1 kg)  08/18/21 151 lb 3.2 oz (68.6 kg)      Other studies Reviewed: Additional studies/ records that were reviewed today include: TTE 11/09/19  Review of the above records today demonstrates:   1. Mild global reduction in LV systolic function; grade 1 diastolic  dysfunction; right heart not well visualized.   2. Left ventricular ejection fraction, by estimation, is 45 to 50%. The  left ventricle has mildly decreased function. The left ventricle  demonstrates global hypokinesis. Left ventricular diastolic parameters are  consistent with Grade I diastolic  dysfunction (impaired relaxation). Elevated left atrial pressure.   3. Right ventricular systolic function is normal. The right ventricular  size is normal. Tricuspid regurgitation signal is inadequate for assessing  PA pressure.   4. Left atrial size was moderately dilated.   5. The mitral valve is normal in structure.  Mild mitral valve  regurgitation. No evidence of mitral stenosis.   6. The aortic valve is tricuspid. Aortic valve regurgitation is mild. No  aortic stenosis is present.   7. Aortic dilatation noted. There is mild dilatation of the aortic root  measuring 39 mm.   8. The inferior vena cava is normal in size with greater than 50%  respiratory variability, suggesting right atrial pressure of 3 mmHg.   Cardiac monitor 02/15/2020 personally reviewed Max 174 bpm 06:30am, 10/25 Min 36 bpm 11:57pm, 11/03 Avg 61 bpm <1% ventricular ectopy 12.3% supraventricular ectopy Predominant underlying rhythm was sinus rhythm 5% atrial fibrillation burden 28 beat run of NSVT No symptoms recorded  ASSESSMENT AND PLAN:  1.  Paroxysmal atrial fibrillation: Currently on Eliquis.  CHA2DS2-VASc of 4.  Wore a cardiac monitor that showed a 5% burden.  Currently on diltiazem 120 mg daily.  2.  Hypertension: Currently well controlled  3.  Pulmonary embolism: Continue Eliquis  4.  Chronic systolic heart failure: Found on most recent echo.  Currently on losartan.  Ejection fraction mildly reduced.  5.  Secondary hypercoagulable state: Currently on Eliquis for atrial fibrillation as above.  Current medicines are reviewed at length with the patient today.   The patient does not have concerns regarding her medicines.  The following changes were made today: Stop diltiazem  Labs/ tests ordered today include:  No orders of the defined types were placed in this encounter.     Disposition:   FU 6 months  Signed, Michaelah Credeur Meredith Leeds, MD  10/14/2021 3:17 PM     [address] [place] [address] [phone] (office) [phone] (fax)

## 2021-10-15 DIAGNOSIS — Z8781 Personal history of (healed) traumatic fracture: Secondary | ICD-10-CM | POA: Diagnosis not present

## 2021-10-15 DIAGNOSIS — K5790 Diverticulosis of intestine, part unspecified, without perforation or abscess without bleeding: Secondary | ICD-10-CM | POA: Diagnosis not present

## 2021-10-15 DIAGNOSIS — I130 Hypertensive heart and chronic kidney disease with heart failure and stage 1 through stage 4 chronic kidney disease, or unspecified chronic kidney disease: Secondary | ICD-10-CM | POA: Diagnosis not present

## 2021-10-15 DIAGNOSIS — I480 Paroxysmal atrial fibrillation: Secondary | ICD-10-CM | POA: Diagnosis not present

## 2021-10-15 DIAGNOSIS — Z7901 Long term (current) use of anticoagulants: Secondary | ICD-10-CM | POA: Diagnosis not present

## 2021-10-15 DIAGNOSIS — G629 Polyneuropathy, unspecified: Secondary | ICD-10-CM | POA: Diagnosis not present

## 2021-10-15 DIAGNOSIS — E538 Deficiency of other specified B group vitamins: Secondary | ICD-10-CM | POA: Diagnosis not present

## 2021-10-15 DIAGNOSIS — M810 Age-related osteoporosis without current pathological fracture: Secondary | ICD-10-CM | POA: Diagnosis not present

## 2021-10-15 DIAGNOSIS — I5041 Acute combined systolic (congestive) and diastolic (congestive) heart failure: Secondary | ICD-10-CM | POA: Diagnosis not present

## 2021-10-15 DIAGNOSIS — D631 Anemia in chronic kidney disease: Secondary | ICD-10-CM | POA: Diagnosis not present

## 2021-10-15 DIAGNOSIS — E785 Hyperlipidemia, unspecified: Secondary | ICD-10-CM | POA: Diagnosis not present

## 2021-10-15 DIAGNOSIS — G8929 Other chronic pain: Secondary | ICD-10-CM | POA: Diagnosis not present

## 2021-10-15 DIAGNOSIS — K219 Gastro-esophageal reflux disease without esophagitis: Secondary | ICD-10-CM | POA: Diagnosis not present

## 2021-10-15 DIAGNOSIS — D630 Anemia in neoplastic disease: Secondary | ICD-10-CM | POA: Diagnosis not present

## 2021-10-15 DIAGNOSIS — M545 Low back pain, unspecified: Secondary | ICD-10-CM | POA: Diagnosis not present

## 2021-10-15 DIAGNOSIS — M1990 Unspecified osteoarthritis, unspecified site: Secondary | ICD-10-CM | POA: Diagnosis not present

## 2021-10-15 DIAGNOSIS — K5901 Slow transit constipation: Secondary | ICD-10-CM | POA: Diagnosis not present

## 2021-10-15 DIAGNOSIS — C44712 Basal cell carcinoma of skin of right lower limb, including hip: Secondary | ICD-10-CM | POA: Diagnosis not present

## 2021-10-15 DIAGNOSIS — Z9181 History of falling: Secondary | ICD-10-CM | POA: Diagnosis not present

## 2021-10-15 DIAGNOSIS — N184 Chronic kidney disease, stage 4 (severe): Secondary | ICD-10-CM | POA: Diagnosis not present

## 2021-10-16 NOTE — Telephone Encounter (Signed)
This was addressed at 7/12 OV

## 2021-10-26 ENCOUNTER — Ambulatory Visit: Payer: Medicare Other | Admitting: Cardiology

## 2021-12-15 DIAGNOSIS — H26491 Other secondary cataract, right eye: Secondary | ICD-10-CM | POA: Diagnosis not present

## 2021-12-16 ENCOUNTER — Other Ambulatory Visit: Payer: Self-pay | Admitting: Cardiology

## 2021-12-16 DIAGNOSIS — I480 Paroxysmal atrial fibrillation: Secondary | ICD-10-CM

## 2021-12-16 NOTE — Telephone Encounter (Signed)
Prescription refill request for Eliquis received. Indication: Afib  Last office visit: 10/14/21 ([place])  Scr: 2.59 (08/18/21) Age: 88 Weight: 71kg  Appropriate dose and refill sent to requested pharmacy.

## 2021-12-30 ENCOUNTER — Telehealth: Payer: Self-pay | Admitting: Family Medicine

## 2021-12-30 NOTE — Telephone Encounter (Signed)
Left message for patient to call back and schedule Medicare Annual Wellness Visit (AWV).   Please offer to do virtually or by telephone.  Left office number and my jabber [phone].  Last AWV:12/27/2020  Please schedule at anytime with Nurse Health Advisor.

## 2022-01-21 ENCOUNTER — Other Ambulatory Visit: Payer: Self-pay

## 2022-01-21 ENCOUNTER — Encounter: Payer: Self-pay | Admitting: Cardiology

## 2022-01-21 DIAGNOSIS — I480 Paroxysmal atrial fibrillation: Secondary | ICD-10-CM

## 2022-01-21 MED ORDER — APIXABAN 2.5 MG PO TABS: ORAL_TABLET | ORAL | 1 refills | Status: DC

## 2022-01-21 NOTE — Telephone Encounter (Signed)
Prescription refill request for Eliquis received. Indication:Afib Last office visit:7/23 Scr:2.5 Age: 88 Weight:71 kg  Prescription refilled

## 2022-01-26 DIAGNOSIS — N184 Chronic kidney disease, stage 4 (severe): Secondary | ICD-10-CM | POA: Diagnosis not present

## 2022-02-04 DIAGNOSIS — I5042 Chronic combined systolic (congestive) and diastolic (congestive) heart failure: Secondary | ICD-10-CM | POA: Diagnosis not present

## 2022-02-04 DIAGNOSIS — I480 Paroxysmal atrial fibrillation: Secondary | ICD-10-CM | POA: Diagnosis not present

## 2022-02-04 DIAGNOSIS — I129 Hypertensive chronic kidney disease with stage 1 through stage 4 chronic kidney disease, or unspecified chronic kidney disease: Secondary | ICD-10-CM | POA: Diagnosis not present

## 2022-02-04 DIAGNOSIS — N184 Chronic kidney disease, stage 4 (severe): Secondary | ICD-10-CM | POA: Diagnosis not present

## 2022-02-04 DIAGNOSIS — N179 Acute kidney failure, unspecified: Secondary | ICD-10-CM | POA: Diagnosis not present

## 2022-02-12 ENCOUNTER — Encounter: Payer: Self-pay | Admitting: Family Medicine

## 2022-02-12 ENCOUNTER — Ambulatory Visit (INDEPENDENT_AMBULATORY_CARE_PROVIDER_SITE_OTHER): Payer: Medicare Other | Admitting: Family Medicine

## 2022-02-12 VITALS — BP 144/75 | HR 91 | Temp 97.9°F | Resp 16 | Wt 151.0 lb

## 2022-02-12 DIAGNOSIS — Z23 Encounter for immunization: Secondary | ICD-10-CM | POA: Diagnosis not present

## 2022-02-12 DIAGNOSIS — G609 Hereditary and idiopathic neuropathy, unspecified: Secondary | ICD-10-CM

## 2022-02-12 DIAGNOSIS — I10 Essential (primary) hypertension: Secondary | ICD-10-CM

## 2022-02-12 LAB — CBC WITH DIFFERENTIAL/PLATELET
Absolute Monocytes: 521 cells/uL (ref 200–950)
Basophils Absolute: 50 cells/uL (ref 0–200)
Basophils Relative: 0.8 %
Eosinophils Absolute: 143 cells/uL (ref 15–500)
Eosinophils Relative: 2.3 %
HCT: 36.9 % (ref 35.0–45.0)
Hemoglobin: 12.1 g/dL (ref 11.7–15.5)
Lymphs Abs: 1476 cells/uL (ref 850–3900)
MCH: 30.5 pg (ref 27.0–33.0)
MCHC: 32.8 g/dL (ref 32.0–36.0)
MCV: 92.9 fL (ref 80.0–100.0)
MPV: 12.2 fL (ref 7.5–12.5)
Monocytes Relative: 8.4 %
Neutro Abs: 4011 cells/uL (ref 1500–7800)
Neutrophils Relative %: 64.7 %
Platelets: 288 10*3/uL (ref 140–400)
RBC: 3.97 10*6/uL (ref 3.80–5.10)
RDW: 13.6 % (ref 11.0–15.0)
Total Lymphocyte: 23.8 %
WBC: 6.2 10*3/uL (ref 3.8–10.8)

## 2022-02-12 LAB — COMPREHENSIVE METABOLIC PANEL
AG Ratio: 1.4 (calc) (ref 1.0–2.5)
ALT: 6 U/L (ref 6–29)
AST: 11 U/L (ref 10–35)
Albumin: 4.2 g/dL (ref 3.6–5.1)
Alkaline phosphatase (APISO): 54 U/L (ref 37–153)
BUN/Creatinine Ratio: 14 (calc) (ref 6–22)
BUN: 27 mg/dL — ABNORMAL HIGH (ref 7–25)
CO2: 29 mmol/L (ref 20–32)
Calcium: 10.2 mg/dL (ref 8.6–10.4)
Chloride: 101 mmol/L (ref 98–110)
Creat: 1.89 mg/dL — ABNORMAL HIGH (ref 0.60–0.95)
Globulin: 2.9 g/dL (calc) (ref 1.9–3.7)
Glucose, Bld: 90 mg/dL (ref 65–99)
Potassium: 4 mmol/L (ref 3.5–5.3)
Sodium: 140 mmol/L (ref 135–146)
Total Bilirubin: 0.6 mg/dL (ref 0.2–1.2)
Total Protein: 7.1 g/dL (ref 6.1–8.1)

## 2022-02-12 LAB — LIPID PANEL
Cholesterol: 200 mg/dL — ABNORMAL HIGH (ref <200)
HDL: 76 mg/dL (ref >=50)
LDL Cholesterol (Calc): 104 mg/dL (calc) — ABNORMAL HIGH
Non-HDL Cholesterol (Calc): 124 mg/dL (calc) (ref <130)
Total CHOL/HDL Ratio: 2.6 (calc) (ref <5.0)
Triglycerides: 102 mg/dL (ref <150)

## 2022-02-12 MED ORDER — PREGABALIN 50 MG PO CAPS: 50.0000 mg | ORAL_CAPSULE | Freq: Every day | ORAL | 1 refills | Status: DC

## 2022-02-12 NOTE — Patient Instructions (Signed)
Thinx underwear--- -on line    DASH Eating Plan DASH stands for Dietary Approaches to Stop Hypertension. The DASH eating plan is a healthy eating plan that has been shown to: Reduce high blood pressure (hypertension). Reduce your risk for type 2 diabetes, heart disease, and stroke. Help with weight loss. What are tips for following this plan? Reading food labels Check food labels for the amount of salt (sodium) per serving. Choose foods with less than 5 percent of the Daily Value of sodium. Generally, foods with less than 300 milligrams (mg) of sodium per serving fit into this eating plan. To find whole grains, look for the word "whole" as the first word in the ingredient list. Shopping Buy products labeled as "low-sodium" or "no salt added." Buy fresh foods. Avoid canned foods and pre-made or frozen meals. Cooking Avoid adding salt when cooking. Use salt-free seasonings or herbs instead of table salt or sea salt. Check with your health care provider or pharmacist before using salt substitutes. Do not fry foods. Cook foods using healthy methods such as baking, boiling, grilling, roasting, and broiling instead. Cook with heart-healthy oils, such as olive, canola, avocado, soybean, or sunflower oil. Meal planning  Eat a balanced diet that includes: 4 or more servings of fruits and 4 or more servings of vegetables each day. Try to fill one-half of your plate with fruits and vegetables. 6-8 servings of whole grains each day. Less than 6 oz (170 g) of lean meat, poultry, or fish each day. A 3-oz (85-g) serving of meat is about the same size as a deck of cards. One egg equals 1 oz (28 g). 2-3 servings of low-fat dairy each day. One serving is 1 cup (237 mL). 1 serving of nuts, seeds, or beans 5 times each week. 2-3 servings of heart-healthy fats. Healthy fats called omega-3 fatty acids are found in foods such as walnuts, flaxseeds, fortified milks, and eggs. These fats are also found in  cold-water fish, such as sardines, salmon, and mackerel. Limit how much you eat of: Canned or prepackaged foods. Food that is high in trans fat, such as some fried foods. Food that is high in saturated fat, such as fatty meat. Desserts and other sweets, sugary drinks, and other foods with added sugar. Full-fat dairy products. Do not salt foods before eating. Do not eat more than 4 egg yolks a week. Try to eat at least 2 vegetarian meals a week. Eat more home-cooked food and less restaurant, buffet, and fast food. Lifestyle When eating at a restaurant, ask that your food be prepared with less salt or no salt, if possible. If you drink alcohol: Limit how much you use to: 0-1 drink a day for women who are not pregnant. 0-2 drinks a day for men. Be aware of how much alcohol is in your drink. In the [place] one drink equals one 12 oz bottle of beer (355 mL), one 5 oz glass of wine (148 mL), or one 1 oz glass of hard liquor (44 mL). General information Avoid eating more than 2,300 mg of salt a day. If you have hypertension, you may need to reduce your sodium intake to 1,500 mg a day. Work with your health care provider to maintain a healthy body weight or to lose weight. Ask what an ideal weight is for you. Get at least 30 minutes of exercise that causes your heart to beat faster (aerobic exercise) most days of the week. Activities may include walking, swimming, or biking. Work with  your health care provider or dietitian to adjust your eating plan to your individual calorie needs. What foods should I eat? Fruits All fresh, dried, or frozen fruit. Canned fruit in natural juice (without added sugar). Vegetables Fresh or frozen vegetables (raw, steamed, roasted, or grilled). Low-sodium or reduced-sodium tomato and vegetable juice. Low-sodium or reduced-sodium tomato sauce and tomato paste. Low-sodium or reduced-sodium canned vegetables. Grains Whole-grain or whole-wheat bread. Whole-grain or  whole-wheat pasta. Brown rice. [place]. Bulgur. Whole-grain and low-sodium cereals. Pita bread. Low-fat, low-sodium crackers. Whole-wheat flour tortillas. Meats and other proteins Skinless chicken or [place]. Ground chicken or [place]. Pork with fat trimmed off. Fish and seafood. Egg whites. Dried beans, peas, or lentils. Unsalted nuts, nut butters, and seeds. Unsalted canned beans. Lean cuts of beef with fat trimmed off. Low-sodium, lean precooked or cured meat, such as sausages or meat loaves. Dairy Low-fat (1%) or fat-free (skim) milk. Reduced-fat, low-fat, or fat-free cheeses. Nonfat, low-sodium ricotta or cottage cheese. Low-fat or nonfat yogurt. Low-fat, low-sodium cheese. Fats and oils Soft margarine without trans fats. Vegetable oil. Reduced-fat, low-fat, or light mayonnaise and salad dressings (reduced-sodium). Canola, safflower, olive, avocado, soybean, and sunflower oils. Avocado. Seasonings and condiments Herbs. Spices. Seasoning mixes without salt. Other foods Unsalted popcorn and pretzels. Fat-free sweets. The items listed above may not be a complete list of foods and beverages you can eat. Contact a dietitian for more information. What foods should I avoid? Fruits Canned fruit in a light or heavy syrup. Fried fruit. Fruit in cream or butter sauce. Vegetables Creamed or fried vegetables. Vegetables in a cheese sauce. Regular canned vegetables (not low-sodium or reduced-sodium). Regular canned tomato sauce and paste (not low-sodium or reduced-sodium). Regular tomato and vegetable juice (not low-sodium or reduced-sodium). Angie Fava. Olives. Grains Baked goods made with fat, such as croissants, muffins, or some breads. Dry pasta or rice meal packs. Meats and other proteins Fatty cuts of meat. Ribs. Fried meat. Berniece Salines. Bologna, salami, and other precooked or cured meats, such as sausages or meat loaves. Fat from the back of a pig (fatback). Bratwurst. Salted nuts and seeds. Canned  beans with added salt. Canned or smoked fish. Whole eggs or egg yolks. Chicken or [place] with skin. Dairy Whole or 2% milk, cream, and half-and-half. Whole or full-fat cream cheese. Whole-fat or sweetened yogurt. Full-fat cheese. Nondairy creamers. Whipped toppings. Processed cheese and cheese spreads. Fats and oils Butter. Stick margarine. Lard. Shortening. Ghee. Bacon fat. Tropical oils, such as coconut, palm kernel, or palm oil. Seasonings and condiments Onion salt, garlic salt, seasoned salt, table salt, and sea salt. Worcestershire sauce. Tartar sauce. Barbecue sauce. Teriyaki sauce. Soy sauce, including reduced-sodium. Steak sauce. Canned and packaged gravies. Fish sauce. Oyster sauce. Cocktail sauce. Store-bought horseradish. Ketchup. Mustard. Meat flavorings and tenderizers. Bouillon cubes. Hot sauces. Pre-made or packaged marinades. Pre-made or packaged taco seasonings. Relishes. Regular salad dressings. Other foods Salted popcorn and pretzels. The items listed above may not be a complete list of foods and beverages you should avoid. Contact a dietitian for more information. Where to find more information National Heart, Lung, and Blood Institute: https://wilson-eaton.com/ American Heart Association: www.heart.org Academy of Nutrition and Dietetics: www.eatright.[place]: www.kidney.org Summary The DASH eating plan is a healthy eating plan that has been shown to reduce high blood pressure (hypertension). It may also reduce your risk for type 2 diabetes, heart disease, and stroke. When on the DASH eating plan, aim to eat more fresh fruits and vegetables, whole grains, lean proteins, low-fat  dairy, and heart-healthy fats. With the DASH eating plan, you should limit salt (sodium) intake to 2,300 mg a day. If you have hypertension, you may need to reduce your sodium intake to 1,500 mg a day. Work with your health care provider or dietitian to adjust your eating plan to your  individual calorie needs. This information is not intended to replace advice given to you by your health care provider. Make sure you discuss any questions you have with your health care provider. Document Revised: 02/23/2019 Document Reviewed: 02/23/2019 Elsevier Patient Education  [place].

## 2022-02-12 NOTE — Assessment & Plan Note (Signed)
Well controlled, no changes to meds. Encouraged heart healthy diet such as the DASH diet and exercise as tolerated.  °

## 2022-02-12 NOTE — Progress Notes (Signed)
Subjective:   By signing my name below, I, Krystal Henderson, attest that this documentation has been prepared under the direction and in the presence of Krystal Henderson, 02/12/2022.   Patient ID: Krystal Henderson, female    DOB: 1932-06-14, 88 y.o.   MRN: [id number]  Chief Complaint  Patient presents with   Hypertension    Here for follow up    HPI Patient is in today for an office visit.  Neurology Patient reports that her neurologist, Dr. Butler Henderson, wanted her PCP to refill her 50 mg Lyrica medication if needed.  Musculoskeletal   Patient is complaining of a painful spot distal to the knee on her right leg starting while ago. She reports it is the same spot where she had a cancer biopsy. Patient reports that it is only painful with palpation and has no problems walking.  Immunization  Patient is receiving an influenza vaccine this visit.  Health Maintenance Due  Topic Date Due   Zoster Vaccines- Shingrix (1 of 2) Never done   COVID-19 Vaccine (4 - Pfizer risk series) 03/28/2020   TETANUS/TDAP  03/14/2021   Medicare Annual Wellness (AWV)  12/27/2021    Past Medical History:  Diagnosis Date   Anemia    hx of    Angiodysplasia 2007   @ colonoscopy   Anxiety    PMH of   Chronic low back pain 06/21/2014   Diverticulosis of colon (without mention of hemorrhage)    DJD (degenerative joint disease)    Esophageal reflux    inactive   History of vertebral fracture 11/2015   HTN (hypertension)    Hyperlipemia 2006   LDL 130   Hypoglycemia    reactive   Pelvic fracture ([place]) 12/30/2011   [facility]   Peripheral neuropathy    Personal history of colonic polyps    adenomatous   Vitamin B12 deficiency     Past Surgical History:  Procedure Laterality Date   cataract surgery  12-26-12 and 01-09-13   COLONOSCOPY  2011   neg   COLONOSCOPY W/ POLYPECTOMY     X 2 , Dr  Krystal Henderson; angiodysplasia. Due 2022   DILATION AND CURETTAGE OF UTERUS     FACIAL  COSMETIC SURGERY     HEMORROIDECTOMY     LUMBAR LAMINECTOMY/DECOMPRESSION MICRODISCECTOMY  09/10/2011   Procedure: LUMBAR LAMINECTOMY/DECOMPRESSION MICRODISCECTOMY;  Surgeon: Krystal Hai, MD;  Location: [facility];  Service: Orthopedics;  Laterality: N/A;  decompression laminectomy L2-3, L3-4, L4-5   ORIF WRIST FRACTURE  01/11/2012   Procedure: OPEN REDUCTION INTERNAL FIXATION (ORIF) WRIST FRACTURE;  Surgeon: Krystal Must, MD;  Location: [place];  Service: Orthopedics;  Laterality: Right;   TEAR DUCT PROBING     X 2   TOTAL HIP ARTHROPLASTY  2000   right   TUBAL LIGATION      Family History  Problem Relation Age of Onset   Heart attack Father 40   Kidney disease Mother        renal failure   Throat cancer Sister        smoker   Osteoporosis Sister    Heart attack Brother 69   Stroke Brother 86       smoker   Depression Maternal Uncle    Cancer Brother        X3  lung cancer, all smokers   Peripheral vascular disease Daughter    Colon cancer Neg Hx    Diabetes Neg  Hx     Social History   Socioeconomic History   Marital status: Married    Spouse name: Krystal Henderson   Number of children: 1   Years of education: hs   Highest education level: Not on file  Occupational History   Occupation: retired    Fish farm manager: RETIRED  Tobacco Use   Smoking status: Never   Smokeless tobacco: Never  Substance and Sexual Activity   Alcohol use: Yes    Alcohol/week: 3.0 standard drinks of alcohol    Types: 3 Glasses of wine per week    Comment: wine occasionally   Drug use: No   Sexual activity: Yes    Birth control/protection: Diaphragm  Other Topics Concern   Not on file  Social History Narrative   Patient is right handed.   Patient does not drink caffeine.   Lives with husband   Krystal Henderson lives nearby   Social Determinants of Health   Financial Resource Strain: Low Risk  (03/23/2021)   Overall Financial Resource Strain (CARDIA)    Difficulty of Paying Living  Expenses: Not very hard  Recent Concern: Financial Resource Strain - Medium Risk (12/30/2020)   Overall Financial Resource Strain (CARDIA)    Difficulty of Paying Living Expenses: Somewhat hard  Food Insecurity: No Food Insecurity (12/27/2020)   Hunger Vital Sign    Worried About Running Out of Food in the Last Year: Never true    Ran Out of Food in the Last Year: Never true  Transportation Needs: No Transportation Needs (12/27/2020)   PRAPARE - Hydrologist (Medical): No    Lack of Transportation (Non-Medical): No  Physical Activity: Insufficiently Active (03/23/2021)   Exercise Vital Sign    Days of Exercise per Week: 6 days    Minutes of Exercise per Session: 10 min  Stress: Stress Concern Present (12/27/2020)   [place]    Feeling of Stress : To some extent  Social Connections: Moderately Isolated (12/27/2020)   Social Connection and Isolation Panel [NHANES]    Frequency of Communication with Friends and Family: More than three times a week    Frequency of Social Gatherings with Friends and Family: More than three times a week    Attends Religious Services: Never    Marine scientist or Organizations: No    Attends Archivist Meetings: Never    Marital Status: Married  Human resources officer Violence: Not At Risk (12/27/2020)   Humiliation, Afraid, Rape, and Kick questionnaire    Fear of Current or Ex-Partner: No    Emotionally Abused: No    Physically Abused: No    Sexually Abused: No    Outpatient Medications Prior to Visit  Medication Sig Dispense Refill   acetaminophen (TYLENOL) 325 MG tablet Take 2 tablets (650 mg total) by mouth every 6 (six) hours as needed for mild pain (or Fever >/= 101).     apixaban (ELIQUIS) 2.5 MG TABS tablet TAKE 1 TABLET(2.5 MG) BY MOUTH TWICE DAILY 180 tablet 1   Ascorbic Acid (VITAMIN C) 1000 MG tablet Take 1,000 mg by mouth daily.      Calcium Carbonate (CALCIUM 600 PO) Take 600 mg by mouth daily.     Cholecalciferol (VITAMIN D) 2000 UNITS CAPS Take 2,000 Units by mouth daily.     ferrous gluconate (FERGON) 324 MG tablet Take 1 tablet (324 mg total) by mouth daily. 90 tablet 3   folic acid (FOLVITE) 1  MG tablet Take 1 tablet (1 mg total) by mouth daily.  3   furosemide (LASIX) 40 MG tablet Take 1 tablet (40 mg total) by mouth daily. (Patient taking differently: Take 20 mg by mouth daily.) 90 tablet 3   pantoprazole (PROTONIX) 40 MG tablet Take 1 tablet (40 mg total) by mouth daily.     Polyethyl Glycol-Propyl Glycol (SYSTANE ULTRA OP) Place 1 drop into both eyes daily as needed (for dry eyes).     polyethylene glycol (MIRALAX / GLYCOLAX) 17 g packet Take 17 g by mouth 2 (two) times daily. 14 each 0   thiamine 100 MG tablet Take 1 tablet (100 mg total) by mouth daily.     vitamin B-12 (CYANOCOBALAMIN) 1000 MCG tablet Take 1 tablet (1,000 mcg total) by mouth daily.     pregabalin (LYRICA) 50 MG capsule Take 1 capsule (50 mg total) by mouth at bedtime. 30 capsule 5   hydrOXYzine (ATARAX) 10 MG tablet Take 1 tablet (10 mg total) by mouth 3 (three) times daily as needed for anxiety. 30 tablet 0   senna-docusate (SENOKOT-S) 8.6-50 MG tablet Take 1 tablet by mouth 2 (two) times daily.     No facility-administered medications prior to visit.    Allergies  Allergen Reactions   Codeine Nausea Only   Duloxetine Other (See Comments)    REACTION: intolerance--She does not remember taking this Rx- states she did not feel well    Sertraline Hcl Other (See Comments)    REACTION: intolerance-- Did not help with Depression    Review of Systems  Constitutional:  Negative for chills, fever and malaise/fatigue.  HENT:  Negative for congestion and hearing loss.   Eyes:  Negative for discharge.  Respiratory:  Negative for cough, sputum production and shortness of breath.   Cardiovascular:  Negative for chest pain, palpitations and leg  swelling.  Gastrointestinal:  Negative for abdominal pain, blood in stool, constipation, diarrhea, heartburn, nausea and vomiting.  Genitourinary:  Negative for dysuria, frequency, hematuria and urgency.  Musculoskeletal:  Negative for back pain, falls and myalgias.  Skin:  Negative for rash.  Neurological:  Negative for dizziness, sensory change, loss of consciousness, weakness and headaches.  Endo/Heme/Allergies:  Negative for environmental allergies. Does not bruise/bleed easily.  Psychiatric/Behavioral:  Negative for depression and suicidal ideas. The patient is not nervous/anxious and does not have insomnia.        Objective:    Physical Exam Vitals and nursing note reviewed.  Constitutional:      General: She is not in acute distress.    Appearance: Normal appearance. She is not ill-appearing.  HENT:     Head: Normocephalic and atraumatic.     Right Ear: External ear normal.     Left Ear: External ear normal.  Eyes:     Extraocular Movements: Extraocular movements intact.     Pupils: Pupils are equal, round, and reactive to light.  Cardiovascular:     Rate and Rhythm: Normal rate and regular rhythm.     Heart sounds: Normal heart sounds. No murmur heard.    No gallop.  Pulmonary:     Effort: Pulmonary effort is normal. No respiratory distress.     Breath sounds: Normal breath sounds. No wheezing or rales.  Musculoskeletal:        General: Swelling present.     Right lower leg: 1+ Pitting Edema present.     Left lower leg: 1+ Pitting Edema present.  Skin:    General: Skin is  warm and dry.  Neurological:     Mental Status: She is alert and oriented to person, place, and time.  Psychiatric:        Judgment: Judgment normal.     BP (!) 144/75 (BP Location: Left Arm, Patient Position: Sitting, Cuff Size: Small)   Pulse 91   Temp 97.9 F (36.6 C) (Oral)   Resp 16   Wt 151 lb (68.5 kg)   SpO2 99%   BMI 25.13 kg/m  Wt Readings from Last 3 Encounters:  02/12/22 151  lb (68.5 kg)  10/14/21 156 lb 9.6 oz (71 kg)  09/03/21 163 lb 6.4 oz (74.1 kg)       Assessment & Plan:   Problem List Items Addressed This Visit       Unprioritized   HTN (hypertension) - Primary    Well controlled, no changes to meds. Encouraged heart healthy diet such as the DASH diet and exercise as tolerated.        Relevant Orders   CBC with Differential/Platelet   Comprehensive metabolic panel   Lipid panel   Other Visit Diagnoses     Peripheral neuropathy, idiopathic       Relevant Medications   pregabalin (LYRICA) 50 MG capsule   Need for influenza vaccination       Relevant Orders   Flu Vaccine QUAD High Dose(Fluad) (Completed)      Meds ordered this encounter  Medications   pregabalin (LYRICA) 50 MG capsule    Sig: Take 1 capsule (50 mg total) by mouth at bedtime.    Dispense:  90 capsule    Refill:  1    I, Krystal Henderson, personally preformed the services described in this documentation.  All medical record entries made by the scribe were at my direction and in my presence.  I have reviewed the chart and discharge instructions (if applicable) and agree that the record reflects my personal performance and is accurate and complete. 02/12/2022.   I,Verona Buck,acting as a Education administrator for [employer], DO.,have documented all relevant documentation on the behalf of Ann Held, DO,as directed by  Ann Held, DO while in the presence of Ann Held, DO.    Ann Held, DO

## 2022-02-18 ENCOUNTER — Encounter: Payer: Self-pay | Admitting: Family Medicine

## 2022-02-18 ENCOUNTER — Ambulatory Visit: Payer: Medicare Other | Admitting: Family Medicine

## 2022-02-20 ENCOUNTER — Other Ambulatory Visit: Payer: Self-pay | Admitting: Neurology

## 2022-02-20 DIAGNOSIS — G609 Hereditary and idiopathic neuropathy, unspecified: Secondary | ICD-10-CM

## 2022-02-22 ENCOUNTER — Encounter: Payer: Self-pay | Admitting: Family Medicine

## 2022-02-22 NOTE — Telephone Encounter (Signed)
Refills is due.

## 2022-03-31 ENCOUNTER — Ambulatory Visit (INDEPENDENT_AMBULATORY_CARE_PROVIDER_SITE_OTHER): Payer: Medicare Other | Admitting: Family Medicine

## 2022-03-31 ENCOUNTER — Encounter: Payer: Self-pay | Admitting: Family Medicine

## 2022-03-31 VITALS — BP 148/89 | HR 87 | Temp 97.6°F | Resp 16 | Wt 142.2 lb

## 2022-03-31 DIAGNOSIS — H6123 Impacted cerumen, bilateral: Secondary | ICD-10-CM | POA: Diagnosis not present

## 2022-03-31 NOTE — Progress Notes (Signed)
   Acute Office Visit  Subjective:     Patient ID: Krystal Henderson, female    DOB: 1932/05/23, 88 y.o.   MRN: [id number]  Chief Complaint  Patient presents with   Hearing Problem    HPI Patient is in today for decreased hearing.   She is here with her husband who is having to speak very loudly so she can understand what it is going on. They deny any recent mental status changes, just trouble hearing for the past week or two. Reports she has had to have her ears irrigated in the past for this same issue. She denies any ear pain, drainage, fever, chills, URI symptoms. Left ear seems slightly worse than right.    ROS All review of systems negative except what is listed in the HPI      Objective:    BP (!) 148/89   Pulse 87   Temp 97.6 F (36.4 C)   Resp 16   Wt 142 lb 3.2 oz (64.5 kg)   SpO2 99%   BMI 23.66 kg/m    Physical Exam Vitals reviewed.  Constitutional:      Appearance: Normal appearance.  HENT:     Head: Normocephalic and atraumatic.     Right Ear: There is impacted cerumen.     Left Ear: There is impacted cerumen.  Musculoskeletal:     Cervical back: Normal range of motion and neck supple.  Neurological:     Mental Status: She is alert and oriented to person, place, and time.  Psychiatric:        Mood and Affect: Mood normal.        Behavior: Behavior normal.        Thought Content: Thought content normal.        Judgment: Judgment normal.        No results found for any visits on 03/31/22.      Assessment & Plan:   Problem List Items Addressed This Visit   None Visit Diagnoses     Bilateral impacted cerumen    -  Primary Indication: Cerumen impaction of the ears (bilateral)  Medical necessity statement: On physical examination, cerumen impairs clinically significant portions of the external auditory canals, and tympanic membranes. Noted obstructive, copious cerumen that cannot be removed without magnification and instrumentations  requiring  skills (performed by CMA) Consent: Discussed benefits and risks of procedure and verbal consent obtained Procedure: Patient was prepped for the procedure. Utilized an otoscope to assess and take note of the ear canal, the tympanic membrane, and the presence, amount, and placement of the cerumen. Gentle water irrigation and soft plastic curette was utilized to remove cerumen.  Post procedure examination: shows cerumen was completely removed. Patient tolerated procedure well. The patient is made aware that they may experience temporary vertigo, temporary hearing loss, and temporary discomfort. If these symptom last for more than 24 hours to call the clinic or proceed to the ED.        No orders of the defined types were placed in this encounter.   Return if symptoms worsen or fail to improve.  Terrilyn Saver, NP

## 2022-05-27 ENCOUNTER — Telehealth: Payer: Self-pay | Admitting: Family Medicine

## 2022-05-27 NOTE — Telephone Encounter (Signed)
Contacted Krystal Henderson to schedule their annual wellness visit. Patient declined to schedule AWV at this time.   Sherol Dade; Care Guide Amb[facility] Direct Dial: [phone]

## 2022-06-02 ENCOUNTER — Ambulatory Visit: Payer: Medicare Other | Attending: Physician Assistant | Admitting: Physician Assistant

## 2022-06-02 ENCOUNTER — Encounter: Payer: Self-pay | Admitting: Physician Assistant

## 2022-06-02 VITALS — BP 124/82 | HR 99 | Ht 65.0 in | Wt 144.3 lb

## 2022-06-02 DIAGNOSIS — I4819 Other persistent atrial fibrillation: Secondary | ICD-10-CM | POA: Diagnosis not present

## 2022-06-02 DIAGNOSIS — I10 Essential (primary) hypertension: Secondary | ICD-10-CM | POA: Diagnosis not present

## 2022-06-02 DIAGNOSIS — I5042 Chronic combined systolic (congestive) and diastolic (congestive) heart failure: Secondary | ICD-10-CM | POA: Diagnosis not present

## 2022-06-02 DIAGNOSIS — D6869 Other thrombophilia: Secondary | ICD-10-CM | POA: Diagnosis not present

## 2022-06-02 MED ORDER — APIXABAN 2.5 MG PO TABS: ORAL_TABLET | ORAL | 3 refills | Status: DC

## 2022-06-02 NOTE — Patient Instructions (Addendum)
Medication Instructions:   Your physician recommends that you continue on your current medications as directed. Please refer to the Current Medication list given to you today.   *If you need a refill on your cardiac medications before your next appointment, please call your pharmacy*   Lab Work: [place]    If you have labs (blood work) drawn today and your tests are completely normal, you will receive your results only by: [place] (if you have MyChart) OR A paper copy in the mail If you have any lab test that is abnormal or we need to change your treatment, we will call you to review the results.   Testing/Procedures: NONE ORDERED  TODAY    Follow-Up: At [facility], you and your health needs are our priority.  As part of our continuing mission to provide you with exceptional heart care, we have created designated Provider Care Teams.  These Care Teams include your primary Cardiologist (physician) and Advanced Practice Providers (APPs -  Physician Assistants and Nurse Practitioners) who all work together to provide you with the care you need, when you need it.  We recommend signing up for the patient portal called "MyChart".  Sign up information is provided on this After Visit Summary.  MyChart is used to connect with patients for Virtual Visits (Telemedicine).  Patients are able to view lab/test results, encounter notes, upcoming appointments, etc.  Non-urgent messages can be sent to your provider as well.   To learn more about what you can do with MyChart, go to [email or web address].    Your next appointment:   6 month(s)  Provider:   You may see Dr. Curt Bears  or one of the following Advanced Practice Providers on your designated Care Team:   Tommye Standard, [place]   Other Instructions

## 2022-06-02 NOTE — Progress Notes (Signed)
Cardiology Office Note Date:  06/02/2022  Patient ID:  Krystal, Henderson [birth year], MRN [id number] PCP:  Ann Held, DO  Electrophysiologist: Dr. Curt Bears Nephrology: Dr. Katheren Puller    Chief Complaint:   6 mo  History of Present Illness: Krystal Henderson is a 89 y.o. female with history of HTN, HLD, PE, AFib, chronic CHF (systolic), CKD (IV)  She comes in today to be seen for Dr. Curt Bears, last seen by him 11/24/20, she was fatigued, suspected the meds.  She had a 5% AF burden on a monitor.  Toprol changed to dilt in effort to alleviate some of her fatigue.  She was hospitalized 07/22/21 with progressive weakness, to the point of unable to stand independently, dry cough a couple dys, LE swelling, found anemic (8.9hgb) and hypoxic w/CXR noting CHF and perhaps a pneumonia, she was in AFib rates 50s. Our cardiology team consulted. LVEF 50-55%, mild LVH, mild-mod MR Markedly volume OL with diuresis Creat on the rise  to the 3's Her [place] held on admission 2/2 her anemia, suggested perhaps palliative with rising creat, no wishes for HD, advanced age and debility Neohrology also aided her stay GI, Hold off on any endoscopic procedures d/t advanced age and multiple comorbidities.  Besides, she did refuse as well. Can always reconsider if she has any active bleeding", Eliquis resumed Discharged 08/01/21, with Creat 2.66 near her baseline  Saw medicine team Labs 08/18/21 K+ 4.2 BUN/Creat 32/2.59 Hgb 10.6  I saw her 09/03/21 She is accompanied by her husband She spent 2 weeks in rehab and is home now a couple weeks, and very happy to be home. Her only c/o is of her terrible back pain, they report a chronic compression fracture. She denies any CP, palpitations or cardiac awareness of any kind. She denies any SOB, does not do too much in the way of activity, but denies DOE. No near syncope or syncope. No bleeding or signs of bleeding She is edematous, her husband reports when she  left the hospital was gone, slowly though has crept back in, though not as much They saw her nephrologist yesterday, no changes were made, reported her kidney function as improved some. Discussed and planned conservative/rate control strategy for her AFib given she appeared asymptomatic and rate controlled, and to reach out to her nephrologist for guidance on diuretic management, metoprolol for rate management  Fatigued on BB >> dilt  She saw Dr. Curt Bears in July 2023, had suffered a compression fracture, not felt a surgical candidate by neurosurgery, in a lot of pain.  Cardiac-wise stable, no changes were made.  TODAY She is doing fairly well. She has c/w pretty significant back pain, gets relief by sitting with good back support. She denies SOB, but has an occasional chest awareness, ?not quite pain, perhaps fast beats, just comes and goes. Sometimes she hold her chest/puts her hand on her chest and it seems to help. No near syncope or syncope. No bleeding or signs of bleeding  She sees Dr. Royce Macadamia in April    AFib/AAD hx Diagnosed 11/2019 in the environment of a PE   Past Medical History:  Diagnosis Date   Anemia    hx of    Angiodysplasia 2007   @ colonoscopy   Anxiety    PMH of   Chronic low back pain 06/21/2014   Diverticulosis of colon (without mention of hemorrhage)    DJD (degenerative joint disease)    Esophageal reflux  inactive   History of vertebral fracture 11/2015   HTN (hypertension)    Hyperlipemia 2006   LDL 130   Hypoglycemia    reactive   Pelvic fracture ([place]) 12/30/2011   [facility]   Peripheral neuropathy    Personal history of colonic polyps    adenomatous   Vitamin B12 deficiency     Past Surgical History:  Procedure Laterality Date   cataract surgery  12-26-12 and 01-09-13   COLONOSCOPY  2011   neg   COLONOSCOPY W/ POLYPECTOMY     X 2 , Dr  Verl Blalock; angiodysplasia. Due 2022   DILATION AND CURETTAGE OF UTERUS     FACIAL  COSMETIC SURGERY     HEMORROIDECTOMY     LUMBAR LAMINECTOMY/DECOMPRESSION MICRODISCECTOMY  09/10/2011   Procedure: LUMBAR LAMINECTOMY/DECOMPRESSION MICRODISCECTOMY;  Surgeon: Johnn Hai, MD;  Location: [facility];  Service: Orthopedics;  Laterality: N/A;  decompression laminectomy L2-3, L3-4, L4-5   ORIF WRIST FRACTURE  01/11/2012   Procedure: OPEN REDUCTION INTERNAL FIXATION (ORIF) WRIST FRACTURE;  Surgeon: Tennis Must, MD;  Location: [facility];  Service: Orthopedics;  Laterality: Right;   TEAR DUCT PROBING     X 2   TOTAL HIP ARTHROPLASTY  2000   right   TUBAL LIGATION      Current Outpatient Medications  Medication Sig Dispense Refill   acetaminophen (TYLENOL) 325 MG tablet Take 2 tablets (650 mg total) by mouth every 6 (six) hours as needed for mild pain (or Fever >/= 101).     apixaban (ELIQUIS) 2.5 MG TABS tablet TAKE 1 TABLET(2.5 MG) BY MOUTH TWICE DAILY 180 tablet 1   Ascorbic Acid (VITAMIN C) 1000 MG tablet Take 1,000 mg by mouth daily.     Calcium Carbonate (CALCIUM 600 PO) Take 600 mg by mouth daily.     Cholecalciferol (VITAMIN D) 2000 UNITS CAPS Take 2,000 Units by mouth daily.     ferrous gluconate (FERGON) 324 MG tablet Take 1 tablet (324 mg total) by mouth daily. 90 tablet 3   folic acid (FOLVITE) 1 MG tablet Take 1 tablet (1 mg total) by mouth daily.  3   furosemide (LASIX) 40 MG tablet Take 1 tablet (40 mg total) by mouth daily. (Patient taking differently: Take 20 mg by mouth daily.) 90 tablet 3   pantoprazole (PROTONIX) 40 MG tablet Take 1 tablet (40 mg total) by mouth daily.     Polyethyl Glycol-Propyl Glycol (SYSTANE ULTRA OP) Place 1 drop into both eyes daily as needed (for dry eyes).     polyethylene glycol (MIRALAX / GLYCOLAX) 17 g packet Take 17 g by mouth 2 (two) times daily. 14 each 0   pregabalin (LYRICA) 50 MG capsule Take 1 capsule (50 mg total) by mouth at bedtime. 90 capsule 1   thiamine 100 MG tablet Take 1 tablet (100 mg total) by mouth  daily.     vitamin B-12 (CYANOCOBALAMIN) 1000 MCG tablet Take 1 tablet (1,000 mcg total) by mouth daily.     No current facility-administered medications for this visit.    Allergies:   Codeine, Duloxetine, and Sertraline hcl   Social History:  The patient  reports that she has never smoked. She has never used smokeless tobacco. She reports current alcohol use of about 3.0 standard drinks of alcohol per week. She reports that she does not use drugs.   Family History:  The patient's family history includes Cancer in her brother; Depression in her maternal uncle; Heart  attack (age of onset: 9) in her brother; Heart attack (age of onset: 48) in her father; Kidney disease in her mother; Osteoporosis in her sister; Peripheral vascular disease in her daughter; Stroke (age of onset: 87) in her brother; Throat cancer in her sister.  ROS:  Please see the history of present illness.    All other systems are reviewed and otherwise negative.   PHYSICAL EXAM:  VS:  BP 124/82   Pulse 99   Ht '5\' 5"'$  (1.651 m)   Wt 144 lb 4.8 oz (65.5 kg)   SpO2 95%   BMI 24.01 kg/m  BMI: Body mass index is 24.01 kg/m. Well nourished, well developed, in no acute distress HEENT: normocephalic, atraumatic Neck: no JVD, carotid bruits or masses Cardiac:  irreg-irreg; no significant murmurs, no rubs, or gallops Lungs: CTA b/l, no wheezing, rhonchi or rales Abd: soft, nontender MS: no deformity or atrophy Ext: chronic skin changes shins/lower legs, she has 1-2+ edema LLE to midshin, less on the right edema Skin: warm and dry, no rash Neuro:  No gross deficits appreciated Psych: euthymic mood, full affect    EKG:  done today and reviewed by myself AFib 99bpm,, no acute/ischemic looking changes  07/23/2021: TTE  1. Left ventricular ejection fraction, by estimation, is 50 to 55%. The  left ventricle has low normal function. The left ventricle has no regional  wall motion abnormalities. There is mild concentric  left ventricular  hypertrophy. Left ventricular  diastolic parameters are indeterminate.   2. Right ventricular systolic function is normal. The right ventricular  size is normal. There is normal pulmonary artery systolic pressure. The  estimated right ventricular systolic pressure is 123456 mmHg.   3. Left atrial size was severely dilated.   4. The mitral valve is normal in structure. Mild to moderate mitral valve  regurgitation. No evidence of mitral stenosis.   5. The aortic valve is tricuspid. There is mild thickening of the aortic  valve. Aortic valve regurgitation is mild to moderate.   6. Aortic dilatation noted. There is mild dilatation of the ascending  aorta, measuring 43 mm.   7. The inferior vena cava is normal in size with greater than 50%  respiratory variability, suggesting right atrial pressure of 3 mmHg.   Comparison(s): Aortic Dilation increased from prior.    TTE 11/09/19  Review of the above records today demonstrates:   1. Mild global reduction in LV systolic function; grade 1 diastolic  dysfunction; right heart not well visualized.   2. Left ventricular ejection fraction, by estimation, is 45 to 50%. The  left ventricle has mildly decreased function. The left ventricle  demonstrates global hypokinesis. Left ventricular diastolic parameters are  consistent with Grade I diastolic  dysfunction (impaired relaxation). Elevated left atrial pressure.   3. Right ventricular systolic function is normal. The right ventricular  size is normal. Tricuspid regurgitation signal is inadequate for assessing  PA pressure.   4. Left atrial size was moderately dilated.   5. The mitral valve is normal in structure. Mild mitral valve  regurgitation. No evidence of mitral stenosis.   6. The aortic valve is tricuspid. Aortic valve regurgitation is mild. No  aortic stenosis is present.   7. Aortic dilatation noted. There is mild dilatation of the aortic root  measuring 39 mm.   8. The  inferior vena cava is normal in size with greater than 50%  respiratory variability, suggesting right atrial pressure of 3 mmHg.    Cardiac monitor 02/15/2020  personally reviewed Max 174 bpm 06:30am, 10/25 Min 36 bpm 11:57pm, 11/03 Avg 61 bpm <1% ventricular ectopy 12.3% supraventricular ectopy Predominant underlying rhythm was sinus rhythm 5% atrial fibrillation burden 28 beat run of NSVT No symptoms recorded  Recent Labs: 07/22/2021: B Natriuretic Peptide 408.0; Magnesium 2.4 07/23/2021: TSH 3.311 02/12/2022: ALT 6; BUN 27; Creat 1.89; Hemoglobin 12.1; Platelets 288; Potassium 4.0; Sodium 140  02/12/2022: Cholesterol 200; HDL 76; LDL Cholesterol (Calc) 104; Total CHOL/HDL Ratio 2.6; Triglycerides 102   CrCl cannot be calculated (Patient's most recent lab result is older than the maximum 21 days allowed.).   Wt Readings from Last 3 Encounters:  06/02/22 144 lb 4.8 oz (65.5 kg)  03/31/22 142 lb 3.2 oz (64.5 kg)  02/12/22 151 lb (68.5 kg)     Other studies reviewed: Additional studies/records reviewed today include: summarized above  ASSESSMENT AND PLAN:  Persistent AFib CHA2DS2Vasc is 7, on Eliquis, appropriately dosed for  age/renal function Rate controlled  Occasional cardiac awareness, doesn't sound of angina, suspect this is her Afib Defer labs to her nephrologist, with an appointment coming up   Chronic CHF (combined) Chronic asymmetrical edema No SOB Lungs are clear.  HTN No changes   4. Secondary hypercoagulable state  Disposition: we will have her back in 6 mo, sooner if needed  Current medicines are reviewed at length with the patient today.  The patient did not have any concerns regarding medicines.  Venetia Night, PA-C 06/02/2022 3:38 PM     [address]  [phone] [phone] (office)  [phone] (fax)

## 2022-06-03 NOTE — Addendum Note (Signed)
Addended by: Michelle Nasuti on: 06/03/2022 08:56 AM   Modules accepted: Orders

## 2022-06-30 DIAGNOSIS — N184 Chronic kidney disease, stage 4 (severe): Secondary | ICD-10-CM | POA: Diagnosis not present

## 2022-07-06 DIAGNOSIS — I480 Paroxysmal atrial fibrillation: Secondary | ICD-10-CM | POA: Diagnosis not present

## 2022-07-06 DIAGNOSIS — I5042 Chronic combined systolic (congestive) and diastolic (congestive) heart failure: Secondary | ICD-10-CM | POA: Diagnosis not present

## 2022-07-06 DIAGNOSIS — N184 Chronic kidney disease, stage 4 (severe): Secondary | ICD-10-CM | POA: Diagnosis not present

## 2022-07-06 DIAGNOSIS — I129 Hypertensive chronic kidney disease with stage 1 through stage 4 chronic kidney disease, or unspecified chronic kidney disease: Secondary | ICD-10-CM | POA: Diagnosis not present

## 2022-07-19 ENCOUNTER — Other Ambulatory Visit: Payer: Self-pay | Admitting: Cardiology

## 2022-07-19 DIAGNOSIS — I4819 Other persistent atrial fibrillation: Secondary | ICD-10-CM

## 2022-07-19 NOTE — Telephone Encounter (Signed)
Eliquis 2.5mg  refill request received. Patient is 89 years old, weight-65.5kg, Crea-1.89 on 02/12/22, Diagnosis-Afib, and last seen by Francis Dowse on 06/02/22. Dose is appropriate based on dosing criteria. Will send in refill to requested pharmacy.

## 2022-08-19 ENCOUNTER — Ambulatory Visit (INDEPENDENT_AMBULATORY_CARE_PROVIDER_SITE_OTHER): Payer: Medicare Other | Admitting: Family Medicine

## 2022-08-19 ENCOUNTER — Encounter: Payer: Self-pay | Admitting: Family Medicine

## 2022-08-19 VITALS — BP 118/84 | HR 113 | Temp 97.8°F | Resp 16 | Ht 65.0 in | Wt 139.8 lb

## 2022-08-19 DIAGNOSIS — Z0000 Encounter for general adult medical examination without abnormal findings: Secondary | ICD-10-CM | POA: Diagnosis not present

## 2022-08-19 DIAGNOSIS — R600 Localized edema: Secondary | ICD-10-CM

## 2022-08-19 DIAGNOSIS — E538 Deficiency of other specified B group vitamins: Secondary | ICD-10-CM | POA: Diagnosis not present

## 2022-08-19 DIAGNOSIS — D229 Melanocytic nevi, unspecified: Secondary | ICD-10-CM | POA: Diagnosis not present

## 2022-08-19 DIAGNOSIS — E559 Vitamin D deficiency, unspecified: Secondary | ICD-10-CM | POA: Diagnosis not present

## 2022-08-19 DIAGNOSIS — N184 Chronic kidney disease, stage 4 (severe): Secondary | ICD-10-CM

## 2022-08-19 DIAGNOSIS — I509 Heart failure, unspecified: Secondary | ICD-10-CM | POA: Diagnosis not present

## 2022-08-19 DIAGNOSIS — E782 Mixed hyperlipidemia: Secondary | ICD-10-CM

## 2022-08-19 DIAGNOSIS — F419 Anxiety disorder, unspecified: Secondary | ICD-10-CM

## 2022-08-19 DIAGNOSIS — G609 Hereditary and idiopathic neuropathy, unspecified: Secondary | ICD-10-CM

## 2022-08-19 DIAGNOSIS — I10 Essential (primary) hypertension: Secondary | ICD-10-CM | POA: Diagnosis not present

## 2022-08-19 DIAGNOSIS — D508 Other iron deficiency anemias: Secondary | ICD-10-CM | POA: Diagnosis not present

## 2022-08-19 DIAGNOSIS — I4891 Unspecified atrial fibrillation: Secondary | ICD-10-CM

## 2022-08-19 DIAGNOSIS — D649 Anemia, unspecified: Secondary | ICD-10-CM | POA: Diagnosis not present

## 2022-08-19 DIAGNOSIS — I480 Paroxysmal atrial fibrillation: Secondary | ICD-10-CM

## 2022-08-19 MED ORDER — NONFORMULARY OR COMPOUNDED ITEM
2 refills | Status: DC
Start: 2022-08-19 — End: 2023-02-24

## 2022-08-19 MED ORDER — HYDROXYZINE PAMOATE 25 MG PO CAPS
25.0000 mg | ORAL_CAPSULE | Freq: Three times a day (TID) | ORAL | 2 refills | Status: DC | PRN
Start: 2022-08-19 — End: 2023-03-29

## 2022-08-19 MED ORDER — FUROSEMIDE 40 MG PO TABS: 40.0000 mg | ORAL_TABLET | Freq: Every day | ORAL | 3 refills | Status: DC

## 2022-08-19 MED ORDER — PREGABALIN 50 MG PO CAPS
50.0000 mg | ORAL_CAPSULE | Freq: Every day | ORAL | 1 refills | Status: DC
Start: 2022-08-19 — End: 2023-02-17

## 2022-08-19 NOTE — Progress Notes (Addendum)
Subjective:   By signing my name below, I, Shehryar Baig, attest that this documentation has been prepared under the direction and in the presence of Donato Schultz, DO. 08/19/2022   Patient ID: Krystal Henderson, female    DOB: 23-Apr-1932, 89 y.o.   MRN: [id number]  Chief Complaint  Patient presents with   Annual Exam    Pt states fasting     HPI Patient is in today for a comprehensive physical exam. Her husband is present with her during this visit.   She is requesting a refill on 50 mg Lyrica and 20 mg Lasix.  Her husband reports he contracted cold like symptoms and later patient developed similar symptoms. Her symptoms have improved but she has recently developed chest congestion and heart palpitations. She is coughing up phlegm. She denies fever, chills. She has a history of A-fib.  She complains of episodes of feeling jittery. She notices her heart rate beating fasts during these episodes. Her symptoms are worse at night. Her husband notes her symptoms come from her feeling anxious.  She reports a darkened raised spot on her left forearm.  She has lost 30 lb's since she started taking 20 mg lasix daily PO. She is eating a normal diet at this time. She continues following up with her cardiologist and reports no new issues during her follow up in February 2024. She does not have a follow up with her cardiologist scheduled and is waiting for them to reach back out.  Wt Readings from Last 3 Encounters:  08/19/22 139 lb 12.8 oz (63.4 kg)  06/02/22 144 lb 4.8 oz (65.5 kg)  03/31/22 142 lb 3.2 oz (64.5 kg)   She denies fever, new moles, congestion, sinus pain, sore throat, chest pain, palpitations, cough, shortness of breath, wheezing, nausea, vomiting, abdominal pain, diarrhea, constipation, dysuria, frequency, hematuria, new muscle pain, new joint pain, or headaches at this time.  She is UTD on the shingles vaccines and the tetanus vaccine and is interested in receiving them at her  pharmacy.  She is UTD on vision care. She is due for dental care and is planning on scheduling an annual appointment.    Past Medical History:  Diagnosis Date   Anemia    hx of    Angiodysplasia 2007   @ colonoscopy   Anxiety    PMH of   Chronic low back pain 06/21/2014   Diverticulosis of colon (without mention of hemorrhage)    DJD (degenerative joint disease)    Esophageal reflux    inactive   History of vertebral fracture 11/2015   HTN (hypertension)    Hyperlipemia 2006   LDL 130   Hypoglycemia    reactive   Pelvic fracture (HCC) 12/30/2011   [facility]   Peripheral neuropathy    Personal history of colonic polyps    adenomatous   Vitamin B12 deficiency     Past Surgical History:  Procedure Laterality Date   cataract surgery  12-26-12 and 01-09-13   COLONOSCOPY  2011   neg   COLONOSCOPY W/ POLYPECTOMY     X 2 , Dr  Sheryn Bison; angiodysplasia. Due 2022   DILATION AND CURETTAGE OF UTERUS     FACIAL COSMETIC SURGERY     HEMORROIDECTOMY     LUMBAR LAMINECTOMY/DECOMPRESSION MICRODISCECTOMY  09/10/2011   Procedure: LUMBAR LAMINECTOMY/DECOMPRESSION MICRODISCECTOMY;  Surgeon: Javier Docker, MD;  Location: [facility];  Service: Orthopedics;  Laterality: N/A;  decompression laminectomy L2-3, L3-4, L4-5  ORIF WRIST FRACTURE  01/11/2012   Procedure: OPEN REDUCTION INTERNAL FIXATION (ORIF) WRIST FRACTURE;  Surgeon: Tami Ribas, MD;  Location: [facility];  Service: Orthopedics;  Laterality: Right;   TEAR DUCT PROBING     X 2   TOTAL HIP ARTHROPLASTY  2000   right   TUBAL LIGATION      Family History  Problem Relation Age of Onset   Heart attack Father 72   Kidney disease Mother        renal failure   Throat cancer Sister        smoker   Osteoporosis Sister    Heart attack Brother 93   Stroke Brother 60       smoker   Depression Maternal Uncle    Cancer Brother        X3  lung cancer, all smokers   Peripheral vascular disease Daughter     Colon cancer Neg Hx    Diabetes Neg Hx     Social History   Socioeconomic History   Marital status: Married    Spouse name: Peyton Najjar   Number of children: 1   Years of education: hs   Highest education level: Not on file  Occupational History   Occupation: retired    Associate Professor: RETIRED  Tobacco Use   Smoking status: Never   Smokeless tobacco: Never  Substance and Sexual Activity   Alcohol use: Yes    Alcohol/week: 3.0 standard drinks of alcohol    Types: 3 Glasses of wine per week    Comment: wine occasionally   Drug use: No   Sexual activity: Yes    Birth control/protection: Diaphragm  Other Topics Concern   Not on file  Social History Narrative   Patient is right handed.   Patient does not drink caffeine.   Lives with husband   [place] lives nearby   Social Determinants of Health   Financial Resource Strain: Low Risk  (03/23/2021)   Overall Financial Resource Strain (CARDIA)    Difficulty of Paying Living Expenses: Not very hard  Recent Concern: Financial Resource Strain - Medium Risk (12/30/2020)   Overall Financial Resource Strain (CARDIA)    Difficulty of Paying Living Expenses: Somewhat hard  Food Insecurity: No Food Insecurity (12/27/2020)   Hunger Vital Sign    Worried About Running Out of Food in the Last Year: Never true    Ran Out of Food in the Last Year: Never true  Transportation Needs: No Transportation Needs (12/27/2020)   PRAPARE - Administrator, Civil Service (Medical): No    Lack of Transportation (Non-Medical): No  Physical Activity: Insufficiently Active (03/23/2021)   Exercise Vital Sign    Days of Exercise per Week: 6 days    Minutes of Exercise per Session: 10 min  Stress: Stress Concern Present (12/27/2020)   [employer] of Occupational Health - Occupational Stress Questionnaire    Feeling of Stress : To some extent  Social Connections: Moderately Isolated (12/27/2020)   Social Connection and Isolation Panel [NHANES]     Frequency of Communication with Friends and Family: More than three times a week    Frequency of Social Gatherings with Friends and Family: More than three times a week    Attends Religious Services: Never    Database administrator or Organizations: No    Attends Banker Meetings: Never    Marital Status: Married  Catering manager Violence: Not At Risk (12/27/2020)   Humiliation,  Afraid, Rape, and Kick questionnaire    Fear of Current or Ex-Partner: No    Emotionally Abused: No    Physically Abused: No    Sexually Abused: No    Outpatient Medications Prior to Visit  Medication Sig Dispense Refill   acetaminophen (TYLENOL) 325 MG tablet Take 2 tablets (650 mg total) by mouth every 6 (six) hours as needed for mild pain (or Fever >/= 101).     apixaban (ELIQUIS) 2.5 MG TABS tablet TAKE 1 TABLET(2.5 MG) BY MOUTH TWICE DAILY 180 tablet 1   Ascorbic Acid (VITAMIN C) 1000 MG tablet Take 1,000 mg by mouth daily.     Calcium Carbonate (CALCIUM 600 PO) Take 600 mg by mouth daily.     Cholecalciferol (VITAMIN D) 2000 UNITS CAPS Take 2,000 Units by mouth daily.     ferrous gluconate (FERGON) 324 MG tablet Take 1 tablet (324 mg total) by mouth daily. 90 tablet 3   pantoprazole (PROTONIX) 40 MG tablet Take 1 tablet (40 mg total) by mouth daily.     Polyethyl Glycol-Propyl Glycol (SYSTANE ULTRA OP) Place 1 drop into both eyes daily as needed (for dry eyes).     polyethylene glycol (MIRALAX / GLYCOLAX) 17 g packet Take 17 g by mouth 2 (two) times daily. 14 each 0   thiamine 100 MG tablet Take 1 tablet (100 mg total) by mouth daily.     vitamin B-12 (CYANOCOBALAMIN) 1000 MCG tablet Take 1 tablet (1,000 mcg total) by mouth daily.     furosemide (LASIX) 40 MG tablet Take 1 tablet (40 mg total) by mouth daily. (Patient taking differently: Take 20 mg by mouth daily.) 90 tablet 3   pregabalin (LYRICA) 50 MG capsule Take 1 capsule (50 mg total) by mouth at bedtime. 90 capsule 1   No  facility-administered medications prior to visit.    Allergies  Allergen Reactions   Codeine Nausea Only   Duloxetine Other (See Comments)    REACTION: intolerance--She does not remember taking this Rx- states she did not feel well    Sertraline Hcl Other (See Comments)    REACTION: intolerance-- Did not help with Depression    Review of Systems  Constitutional:  Negative for chills, fever and malaise/fatigue.  HENT:  Negative for congestion, sinus pain and sore throat.   Eyes:  Negative for blurred vision.  Respiratory:  Positive for cough and sputum production. Negative for shortness of breath and wheezing.        (+)chest congestion  Cardiovascular:  Negative for chest pain, palpitations and leg swelling.  Gastrointestinal:  Negative for abdominal pain, blood in stool, constipation, diarrhea, nausea and vomiting.  Genitourinary:  Negative for dysuria, frequency and hematuria.  Musculoskeletal:  Negative for falls.       (-)new muscle pain (-)new joint pain  Skin:  Negative for rash.       (-)New moles (+)darkened raised spot on left forearm  Neurological:  Negative for dizziness, loss of consciousness and headaches.  Endo/Heme/Allergies:  Negative for environmental allergies.  Psychiatric/Behavioral:  Negative for depression. The patient is not nervous/anxious.        Objective:    Physical Exam Vitals and nursing note reviewed.  Constitutional:      General: She is not in acute distress.    Appearance: Normal appearance. She is not ill-appearing.  HENT:     Head: Normocephalic and atraumatic.     Right Ear: Tympanic membrane, ear canal and external ear normal.  Left Ear: Tympanic membrane, ear canal and external ear normal.  Eyes:     Extraocular Movements: Extraocular movements intact.     Pupils: Pupils are equal, round, and reactive to light.  Cardiovascular:     Rate and Rhythm: Normal rate. Rhythm irregularly irregular.     Heart sounds: Normal heart  sounds. No murmur heard.    No gallop.     Comments: Patient is in A-fib Pulmonary:     Effort: Pulmonary effort is normal. No respiratory distress.     Breath sounds: Normal breath sounds. No wheezing or rales.  Abdominal:     General: Bowel sounds are normal. There is no distension.     Palpations: Abdomen is soft.     Tenderness: There is no abdominal tenderness. There is no guarding.  Musculoskeletal:        General: Normal range of motion.  Skin:    General: Skin is warm and dry.  Neurological:     General: No focal deficit present.     Mental Status: She is alert and oriented to person, place, and time.  Psychiatric:        Mood and Affect: Mood normal.        Judgment: Judgment normal.     BP 118/84 (BP Location: Left Arm, Patient Position: Sitting, Cuff Size: Normal)   Pulse (!) 113   Temp 97.8 F (36.6 C) (Oral)   Resp 16   Ht 5\' 5"  (1.651 m)   Wt 139 lb 12.8 oz (63.4 kg)   SpO2 95%   BMI 23.26 kg/m  Wt Readings from Last 3 Encounters:  08/19/22 139 lb 12.8 oz (63.4 kg)  06/02/22 144 lb 4.8 oz (65.5 kg)  03/31/22 142 lb 3.2 oz (64.5 kg)       Assessment & Plan:  Preventative health care  Primary hypertension Assessment & Plan: Well controlled, no changes to meds. Encouraged heart healthy diet such as the DASH diet and exercise as tolerated.     Normocytic anemia -     CBC with Differential/Platelet  B12 deficiency -     Vitamin B12  Atrial fibrillation, unspecified type (HCC)  Paroxysmal atrial fibrillation (HCC) Assessment & Plan: Per cardiology On eliquis    CKD (chronic kidney disease), stage IV (HCC)  Chronic congestive heart failure, unspecified heart failure type (HCC) -     Furosemide; Take 1 tablet (40 mg total) by mouth daily.  Dispense: 90 tablet; Refill: 3  Vitamin D deficiency -     VITAMIN D 25 Hydroxy (Vit-D Deficiency, Fractures)  Mixed hyperlipidemia Assessment & Plan: Encourage heart healthy diet such as MIND or DASH  diet, increase exercise, avoid trans fats, simple carbohydrates and processed foods, consider a krill or fish or flaxseed oil cap daily.    Orders: -     Lipid panel -     Comprehensive metabolic panel  Suspicious nevus -     Ambulatory referral to Dermatology  Peripheral neuropathy, idiopathic -     Pregabalin; Take 1 capsule (50 mg total) by mouth at bedtime.  Dispense: 90 capsule; Refill: 1  Lower extremity edema -     NONFORMULARY OR COMPOUNDED ITEM; Compression socks  20-30 mm/hg #1  as directed  Dispense: 1 each; Refill: 2  Anxiety -     hydrOXYzine Pamoate; Take 1 capsule (25 mg total) by mouth every 8 (eight) hours as needed for anxiety.  Dispense: 30 capsule; Refill: 2  Idiopathic peripheral neuropathy Assessment & Plan:  Stable Refill lyrica   Other iron deficiency anemia Assessment & Plan: Check labs      I, Donato Schultz, DO, personally preformed the services described in this documentation.  All medical record entries made by the scribe were at my direction and in my presence.  I have reviewed the chart and discharge instructions (if applicable) and agree that the record reflects my personal performance and is accurate and complete. 08/19/2022   I,Shehryar Baig,acting as a scribe for Donato Schultz, DO.,have documented all relevant documentation on the behalf of Donato Schultz, DO,as directed by  Donato Schultz, DO while in the presence of Donato Schultz, DO.   Donato Schultz, DO

## 2022-08-20 LAB — COMPREHENSIVE METABOLIC PANEL
ALT: 118 U/L — ABNORMAL HIGH (ref 0–35)
AST: 40 U/L — ABNORMAL HIGH (ref 0–37)
Albumin: 3.8 g/dL (ref 3.5–5.2)
Alkaline Phosphatase: 104 U/L (ref 39–117)
BUN: 29 mg/dL — ABNORMAL HIGH (ref 6–23)
CO2: 28 mEq/L (ref 19–32)
Calcium: 9.8 mg/dL (ref 8.4–10.5)
Chloride: 100 mEq/L (ref 96–112)
Creatinine, Ser: 1.82 mg/dL — ABNORMAL HIGH (ref 0.40–1.20)
GFR: 24.57 mL/min — ABNORMAL LOW (ref >60.00)
Glucose, Bld: 91 mg/dL (ref 70–99)
Potassium: 4.4 mEq/L (ref 3.5–5.1)
Sodium: 137 mEq/L (ref 135–145)
Total Bilirubin: 0.9 mg/dL (ref 0.2–1.2)
Total Protein: 6.9 g/dL (ref 6.0–8.3)

## 2022-08-20 LAB — CBC WITH DIFFERENTIAL/PLATELET
Basophils Absolute: 0 10*3/uL (ref 0.0–0.1)
Basophils Relative: 0.6 % (ref 0.0–3.0)
Eosinophils Absolute: 0.1 10*3/uL (ref 0.0–0.7)
Eosinophils Relative: 1.7 % (ref 0.0–5.0)
HCT: 37 % (ref 36.0–46.0)
Hemoglobin: 12.1 g/dL (ref 12.0–15.0)
Lymphocytes Relative: 14.7 % (ref 12.0–46.0)
Lymphs Abs: 0.8 10*3/uL (ref 0.7–4.0)
MCHC: 32.6 g/dL (ref 30.0–36.0)
MCV: 92.6 fl (ref 78.0–100.0)
Monocytes Absolute: 0.5 10*3/uL (ref 0.1–1.0)
Monocytes Relative: 9.1 % (ref 3.0–12.0)
Neutro Abs: 4.2 10*3/uL (ref 1.4–7.7)
Neutrophils Relative %: 73.9 % (ref 43.0–77.0)
Platelets: 302 10*3/uL (ref 150.0–400.0)
RBC: 4 Mil/uL (ref 3.87–5.11)
RDW: 16.4 % — ABNORMAL HIGH (ref 11.5–15.5)
WBC: 5.7 10*3/uL (ref 4.0–10.5)

## 2022-08-20 LAB — VITAMIN B12: Vitamin B-12: 402 pg/mL (ref 211–911)

## 2022-08-20 LAB — LIPID PANEL
Cholesterol: 160 mg/dL (ref 0–200)
HDL: 56.6 mg/dL (ref >39.00)
LDL Cholesterol: 88 mg/dL (ref 0–99)
NonHDL: 103.06
Total CHOL/HDL Ratio: 3
Triglycerides: 74 mg/dL (ref 0.0–149.0)
VLDL: 14.8 mg/dL (ref 0.0–40.0)

## 2022-08-20 LAB — VITAMIN D 25 HYDROXY (VIT D DEFICIENCY, FRACTURES): VITD: 74.63 ng/mL (ref 30.00–100.00)

## 2022-08-20 NOTE — Assessment & Plan Note (Signed)
Well controlled, no changes to meds. Encouraged heart healthy diet such as the DASH diet and exercise as tolerated.  °

## 2022-08-20 NOTE — Assessment & Plan Note (Signed)
Per cardiology ?On eliquis  ?

## 2022-08-20 NOTE — Assessment & Plan Note (Signed)
Encourage heart healthy diet such as MIND or DASH diet, increase exercise, avoid trans fats, simple carbohydrates and processed foods, consider a krill or fish or flaxseed oil cap daily.  °

## 2022-08-20 NOTE — Assessment & Plan Note (Signed)
Stable Refill lyrica

## 2022-08-20 NOTE — Assessment & Plan Note (Signed)
Check labs 

## 2022-08-22 ENCOUNTER — Other Ambulatory Visit: Payer: Self-pay | Admitting: Family Medicine

## 2022-08-22 DIAGNOSIS — R748 Abnormal levels of other serum enzymes: Secondary | ICD-10-CM

## 2022-09-16 ENCOUNTER — Other Ambulatory Visit (INDEPENDENT_AMBULATORY_CARE_PROVIDER_SITE_OTHER): Payer: Medicare Other

## 2022-09-16 DIAGNOSIS — R748 Abnormal levels of other serum enzymes: Secondary | ICD-10-CM | POA: Diagnosis not present

## 2022-09-17 LAB — COMPREHENSIVE METABOLIC PANEL
ALT: 9 U/L (ref 0–35)
AST: 13 U/L (ref 0–37)
Albumin: 4 g/dL (ref 3.5–5.2)
Alkaline Phosphatase: 58 U/L (ref 39–117)
BUN: 30 mg/dL — ABNORMAL HIGH (ref 6–23)
CO2: 29 mEq/L (ref 19–32)
Calcium: 9.6 mg/dL (ref 8.4–10.5)
Chloride: 97 mEq/L (ref 96–112)
Creatinine, Ser: 1.96 mg/dL — ABNORMAL HIGH (ref 0.40–1.20)
GFR: 22.47 mL/min — ABNORMAL LOW (ref >60.00)
Glucose, Bld: 86 mg/dL (ref 70–99)
Potassium: 4.6 mEq/L (ref 3.5–5.1)
Sodium: 136 mEq/L (ref 135–145)
Total Bilirubin: 0.8 mg/dL (ref 0.2–1.2)
Total Protein: 7.1 g/dL (ref 6.0–8.3)

## 2022-09-17 LAB — GAMMA GT: GGT: 16 U/L (ref 7–51)

## 2022-09-22 ENCOUNTER — Ambulatory Visit (INDEPENDENT_AMBULATORY_CARE_PROVIDER_SITE_OTHER): Payer: Medicare Other | Admitting: Medical

## 2022-09-22 VITALS — BP 130/64 | HR 82 | Resp 18 | Ht 65.0 in | Wt 135.0 lb

## 2022-09-22 DIAGNOSIS — L089 Local infection of the skin and subcutaneous tissue, unspecified: Secondary | ICD-10-CM | POA: Diagnosis not present

## 2022-09-22 DIAGNOSIS — T148XXD Other injury of unspecified body region, subsequent encounter: Secondary | ICD-10-CM

## 2022-09-22 DIAGNOSIS — S80822A Blister (nonthermal), left lower leg, initial encounter: Secondary | ICD-10-CM | POA: Diagnosis not present

## 2022-09-22 MED ORDER — AMOXICILLIN-POT CLAVULANATE 500-125 MG PO TABS: ORAL_TABLET | ORAL | 0 refills | Status: DC

## 2022-09-22 MED ORDER — MUPIROCIN 2 % EX OINT: 1.0000 | TOPICAL_OINTMENT | Freq: Two times a day (BID) | CUTANEOUS | 0 refills | Status: DC

## 2022-09-22 NOTE — Progress Notes (Signed)
Subjective:    Patient ID: Krystal Henderson, female    DOB: [birth year], 89 y.o.   MRN: [id number]  HPI   Pt in for left lower leg swelling and broken down area of skin for one week or more. On last visit pt had mentioned some pedal edema. Pcp advised [place] compression socks. Family member put compression sock. Later that day leg seems to swell. Then family saw blister. Blister burst and area started to weep. Pt thinks ocasonal yellow dc on gauze. No fever no chills or sweats.  Pt and family states initially looks liked blister then burst.     Review of Systems  Constitutional:  Negative for chills, fatigue and fever.  Respiratory:  Negative for cough, chest tightness, shortness of breath and wheezing.   Cardiovascular:  Negative for chest pain and palpitations.  Gastrointestinal:  Negative for abdominal pain.  Musculoskeletal:  Negative for back pain.  Skin:  Negative for rash.       See hpi and exam.  Neurological:  Negative for dizziness, speech difficulty, weakness, numbness and headaches.  Hematological:  Negative for adenopathy. Does not bruise/bleed easily.  Psychiatric/Behavioral:  Negative for behavioral problems, confusion, hallucinations and sleep disturbance. The patient is not nervous/anxious.     Past Medical History:  Diagnosis Date   Anemia    hx of    Angiodysplasia 2007   @ colonoscopy   Anxiety    PMH of   Chronic low back pain 06/21/2014   Diverticulosis of colon (without mention of hemorrhage)    DJD (degenerative joint disease)    Esophageal reflux    inactive   History of vertebral fracture 11/2015   HTN (hypertension)    Hyperlipemia 2006   LDL 130   Hypoglycemia    reactive   Pelvic fracture (HCC) 12/30/2011   [facility]   Peripheral neuropathy    Personal history of colonic polyps    adenomatous   Vitamin B12 deficiency      Social History   Socioeconomic History   Marital status: Married    Spouse name: Peyton Najjar   Number of children:  1   Years of education: hs   Highest education level: 12th grade  Occupational History   Occupation: retired    Associate Professor: RETIRED  Tobacco Use   Smoking status: Never   Smokeless tobacco: Never  Substance and Sexual Activity   Alcohol use: Yes    Alcohol/week: 3.0 standard drinks of alcohol    Types: 3 Glasses of wine per week    Comment: wine occasionally   Drug use: No   Sexual activity: Yes    Birth control/protection: Diaphragm  Other Topics Concern   Not on file  Social History Narrative   Patient is right handed.   Patient does not drink caffeine.   Lives with husband   [place] lives nearby   Social Determinants of Health   Financial Resource Strain: Low Risk  (09/21/2022)   Overall Financial Resource Strain (CARDIA)    Difficulty of Paying Living Expenses: Not hard at all  Food Insecurity: No Food Insecurity (09/21/2022)   Hunger Vital Sign    Worried About Running Out of Food in the Last Year: Never true    Ran Out of Food in the Last Year: Never true  Transportation Needs: No Transportation Needs (09/21/2022)   PRAPARE - Administrator, Civil Service (Medical): No    Lack of Transportation (Non-Medical): No  Physical Activity: Unknown (09/21/2022)  Exercise Vital Sign    Days of Exercise per Week: 0 days    Minutes of Exercise per Session: Not on file  Stress: No Stress Concern Present (09/21/2022)   [employer] of Occupational Health - Occupational Stress Questionnaire    Feeling of Stress : Only a little  Social Connections: Unknown (09/21/2022)   Social Connection and Isolation Panel [NHANES]    Frequency of Communication with Friends and Family: Once a week    Frequency of Social Gatherings with Friends and Family: Once a week    Attends Religious Services: Never    Database administrator or Organizations: Not on file    Attends Banker Meetings: Not on file    Marital Status: Married  Intimate Partner Violence: Not At Risk  (12/27/2020)   Humiliation, Afraid, Rape, and Kick questionnaire    Fear of Current or Ex-Partner: No    Emotionally Abused: No    Physically Abused: No    Sexually Abused: No    Past Surgical History:  Procedure Laterality Date   cataract surgery  12-26-12 and 01-09-13   COLONOSCOPY  2011   neg   COLONOSCOPY W/ POLYPECTOMY     X 2 , Dr  Sheryn Bison; angiodysplasia. Due 2022   DILATION AND CURETTAGE OF UTERUS     FACIAL COSMETIC SURGERY     HEMORROIDECTOMY     LUMBAR LAMINECTOMY/DECOMPRESSION MICRODISCECTOMY  09/10/2011   Procedure: LUMBAR LAMINECTOMY/DECOMPRESSION MICRODISCECTOMY;  Surgeon: Javier Docker, MD;  Location: [facility];  Service: Orthopedics;  Laterality: N/A;  decompression laminectomy L2-3, L3-4, L4-5   ORIF WRIST FRACTURE  01/11/2012   Procedure: OPEN REDUCTION INTERNAL FIXATION (ORIF) WRIST FRACTURE;  Surgeon: Tami Ribas, MD;  [facility];  Service: Orthopedics;  Laterality: Right;   TEAR DUCT PROBING     X 2   TOTAL HIP ARTHROPLASTY  2000   right   TUBAL LIGATION      Family History  Problem Relation Age of Onset   Heart attack Father 36   Kidney disease Mother        renal failure   Throat cancer Sister        smoker   Osteoporosis Sister    Heart attack Brother 76   Stroke Brother 50       smoker   Depression Maternal Uncle    Cancer Brother        X3  lung cancer, all smokers   Peripheral vascular disease Daughter    Colon cancer Neg Hx    Diabetes Neg Hx     Allergies  Allergen Reactions   Codeine Nausea Only   Duloxetine Other (See Comments)    REACTION: intolerance--She does not remember taking this Rx- states she did not feel well    Sertraline Hcl Other (See Comments)    REACTION: intolerance-- Did not help with Depression    Current Outpatient Medications on File Prior to Visit  Medication Sig Dispense Refill   acetaminophen (TYLENOL) 325 MG tablet Take 2 tablets (650 mg total) by mouth every 6 (six) hours as  needed for mild pain (or Fever >/= 101).     apixaban (ELIQUIS) 2.5 MG TABS tablet TAKE 1 TABLET(2.5 MG) BY MOUTH TWICE DAILY 180 tablet 1   Ascorbic Acid (VITAMIN C) 1000 MG tablet Take 1,000 mg by mouth daily.     Calcium Carbonate (CALCIUM 600 PO) Take 600 mg by mouth daily.     Cholecalciferol (VITAMIN  D) 2000 UNITS CAPS Take 2,000 Units by mouth daily.     ferrous gluconate (FERGON) 324 MG tablet Take 1 tablet (324 mg total) by mouth daily. 90 tablet 3   furosemide (LASIX) 40 MG tablet Take 1 tablet (40 mg total) by mouth daily. 90 tablet 3   hydrOXYzine (VISTARIL) 25 MG capsule Take 1 capsule (25 mg total) by mouth every 8 (eight) hours as needed for anxiety. 30 capsule 2   NONFORMULARY OR COMPOUNDED ITEM Compression socks  20-30 mm/hg #1  as directed 1 each 2   pantoprazole (PROTONIX) 40 MG tablet Take 1 tablet (40 mg total) by mouth daily.     Polyethyl Glycol-Propyl Glycol (SYSTANE ULTRA OP) Place 1 drop into both eyes daily as needed (for dry eyes).     polyethylene glycol (MIRALAX / GLYCOLAX) 17 g packet Take 17 g by mouth 2 (two) times daily. 14 each 0   pregabalin (LYRICA) 50 MG capsule Take 1 capsule (50 mg total) by mouth at bedtime. 90 capsule 1   thiamine 100 MG tablet Take 1 tablet (100 mg total) by mouth daily.     vitamin B-12 (CYANOCOBALAMIN) 1000 MCG tablet Take 1 tablet (1,000 mcg total) by mouth daily.     No current facility-administered medications on file prior to visit.    BP 130/64   Pulse 82   Resp 18   Ht 5\' 5"  (1.651 m)   Wt 135 lb (61.2 kg)   SpO2 98%   BMI 22.47 kg/m        Objective:   Physical Exam  General Mental Status- Alert. General Appearance- Not in acute distress.   Skin General: Color- Normal Color. Moisture- Normal Moisture.  Neck Carotid Arteries- Normal color. Moisture- Normal Moisture. No carotid bruits. No JVD.  Chest and Lung Exam Auscultation: Breath Sounds:-Normal.  Cardiovascular Auscultation:Rythm- Regular. Murmurs  & Other Heart Sounds:Auscultation of the heart reveals- No Murmurs.    Neurologic Cranial Nerve exam:- CN III-XII intact(No nystagmus), symmetric smile. Strength:- 5/5 equal and symmetric strength both upper and lower extremities.    Left lower ext- lateral aspect 3.5 cm-4.0 cm broken down area of skin. Mild redness areound area but not warm. Calf symmetic to left side. No dc presently.     Assessment & Plan:   Patient Instructions  1. Skin infection  Advised use nonstick daily to cover and wrap. Can lean open to air at night. - Wound culture - Ambulatory referral to Wound Clinic - CBC w/Diff -augmentin antibiotic  2. Delayed wound healing - Ambulatory referral to Wound Clinic(hoping to get you in soon)  Follow up upcoming Monday or Tuesday with pcp or with me if no pcp appointment available.    Esperanza Richters, PA-C

## 2022-09-22 NOTE — Patient Instructions (Signed)
1. Skin infection  Advised use nonstick daily to cover and wrap. Can lean open to air at night. - Wound culture - Ambulatory referral to Wound Clinic - CBC w/Diff -augmentin antibiotic  2. Delayed wound healing - Ambulatory referral to Wound Clinic(hoping to get you in soon)  Follow up upcoming Monday or Tuesday with pcp or with me if no pcp appointment available.

## 2022-09-23 LAB — CBC WITH DIFFERENTIAL/PLATELET
Basophils Absolute: 0.1 10*3/uL (ref 0.0–0.1)
Basophils Relative: 1.1 % (ref 0.0–3.0)
Eosinophils Absolute: 0.1 10*3/uL (ref 0.0–0.7)
Eosinophils Relative: 1.1 % (ref 0.0–5.0)
HCT: 38 % (ref 36.0–46.0)
Hemoglobin: 11.9 g/dL — ABNORMAL LOW (ref 12.0–15.0)
Lymphocytes Relative: 20.7 % (ref 12.0–46.0)
Lymphs Abs: 1.2 10*3/uL (ref 0.7–4.0)
MCHC: 31.3 g/dL (ref 30.0–36.0)
MCV: 91.4 fl (ref 78.0–100.0)
Monocytes Absolute: 0.5 10*3/uL (ref 0.1–1.0)
Monocytes Relative: 9.4 % (ref 3.0–12.0)
Neutro Abs: 3.9 10*3/uL (ref 1.4–7.7)
Neutrophils Relative %: 67.7 % (ref 43.0–77.0)
Platelets: 277 10*3/uL (ref 150.0–400.0)
RBC: 4.15 Mil/uL (ref 3.87–5.11)
RDW: 16.2 % — ABNORMAL HIGH (ref 11.5–15.5)
WBC: 5.8 10*3/uL (ref 4.0–10.5)

## 2022-09-25 LAB — WOUND CULTURE
MICRO NUMBER:: 15102085
SPECIMEN QUALITY:: ADEQUATE

## 2022-09-25 MED ORDER — SULFAMETHOXAZOLE-TRIMETHOPRIM 800-160 MG PO TABS: 1.0000 | ORAL_TABLET | Freq: Two times a day (BID) | ORAL | 0 refills | Status: DC

## 2022-09-25 NOTE — Addendum Note (Signed)
Addended by: Gwenevere Abbot on: 09/25/2022 03:51 PM   Modules accepted: Orders

## 2022-09-28 ENCOUNTER — Ambulatory Visit (INDEPENDENT_AMBULATORY_CARE_PROVIDER_SITE_OTHER): Payer: Medicare Other | Admitting: Family Medicine

## 2022-09-28 ENCOUNTER — Encounter: Payer: Self-pay | Admitting: Family Medicine

## 2022-09-28 VITALS — BP 108/80 | HR 80 | Temp 98.1°F | Resp 18 | Ht 65.0 in

## 2022-09-28 DIAGNOSIS — S80812A Abrasion, left lower leg, initial encounter: Secondary | ICD-10-CM | POA: Diagnosis not present

## 2022-09-28 DIAGNOSIS — T148XXD Other injury of unspecified body region, subsequent encounter: Secondary | ICD-10-CM | POA: Insufficient documentation

## 2022-09-28 NOTE — Assessment & Plan Note (Signed)
L leg Non stick bandages used to dress wound Con't with abx -- bactrim F/u next week

## 2022-09-28 NOTE — Progress Notes (Signed)
   Established Patient Office Visit  Subjective   Patient ID: Krystal Henderson, female    DOB: [birth year]  Age: 89 y.o. MRN: [id number]  Chief Complaint  Patient presents with   Wound Check    HPI Discussed the use of AI scribe software for clinical note transcription with the patient, who gave verbal consent to proceed.  History of Present Illness   The patient presents with a left lower leg skin abrasion that is not healing as expected. The abrasion was initially treated with Augmentin, but due to lack of improvement, the patient was switched to Bactrim, a stronger antibiotic. The patient reports that the abrasion is not weeping, but it is causing discomfort. The patient also has a blister on the same leg, which is causing a stinging sensation. The patient has been applying an antibiotic cream to the abrasion, but has been advised to stop after five days. The patient has been elevating the leg to help with swelling. The patient is scheduled to see a wound clinic, but the earliest appointment is at the end of July. The patient is also scheduled to see a dermatologist.       History of Present Illness            ROS    Objective:     BP 108/80 (BP Location: Left Arm, Patient Position: Sitting, Cuff Size: Normal)   Pulse 80   Temp 98.1 F (36.7 C) (Oral)   Resp 18   Ht 5\' 5"  (1.651 m)   SpO2 98%   BMI 22.47 kg/m    Physical Exam   No results found for any visits on 09/28/22.    The ASCVD Risk score (Arnett DK, et al., 2019) failed to calculate for the following reasons:   The 2019 ASCVD risk score is only valid for ages 42 to 62    Assessment & Plan:   Problem List Items Addressed This Visit   None Visit Diagnoses     Delayed wound healing    -  Primary   Relevant Orders   Ambulatory referral to Home Health     Assessment and Plan    Left Lower Leg Abrasion: Not healing as expected. Recently switched from Augmentin to Bactrim for more potent antibiotic  coverage. New blister formation noted. -Continue Bactrim as prescribed. -Avoid draining blister unless it becomes bothersome. -Elevate leg when sitting to help with swelling. -Attempt to arrange for home health to provide wound care. -Check wound at dermatology appointment on 05/27/2022. -If no improvement or worsening, return to clinic before the weekend.        No follow-ups on file.    Donato Schultz, DO

## 2022-09-28 NOTE — Progress Notes (Signed)
Established Patient Office Visit  Subjective   Patient ID: Krystal Henderson, female    DOB: [birth year]  Age: 89 y.o. MRN: [id number]  Chief Complaint  Patient presents with   Wound Check    HPI Discussed the use of AI scribe software for clinical note transcription with the patient, who gave verbal consent to proceed.  History of Present Illness   The patient presents with a left lower leg skin abrasion that is not healing as expected. The abrasion was initially treated with Augmentin, but due to lack of improvement, the patient was switched to Bactrim, a stronger antibiotic. The patient reports that the abrasion is not weeping, but it is causing discomfort. The patient also has a blister on the same leg, which is causing a stinging sensation. The patient has been applying an antibiotic cream to the abrasion, but has been advised to stop after five days. The patient has been elevating the leg to help with swelling. The patient is scheduled to see a wound clinic, but the earliest appointment is at the end of July. The patient is also scheduled to see a dermatologist.      Patient Active Problem List   Diagnosis Date Noted   Delayed wound healing 09/28/2022   CHF (congestive heart failure) (HCC) 07/24/2021   Leg wound, right 07/23/2021   Acute exacerbation of CHF (congestive heart failure) (HCC) 07/22/2021   Generalized weakness    CKD (chronic kidney disease) stage 4, GFR 15-29 ml/min (HCC)    Headache    Venous stasis dermatitis of right lower extremity 08/07/2020   Insomnia 08/07/2020   Acute on chronic combined systolic and diastolic CHF (congestive heart failure) (HCC) 11/12/2019   Paroxysmal atrial fibrillation (HCC) 11/10/2019   Impaired mobility and ADLs 11/10/2019   Acute pulmonary embolism (HCC) 11/08/2019   Impacted cerumen of left ear 01/03/2019   Lesion of skin of scalp 12/30/2018   Edema 06/22/2018   Iron deficiency anemia 04/02/2018   Lumbar post-laminectomy  syndrome 07/15/2017   Lumbar compression fracture (HCC) 11/26/2015   Lower extremity edema 10/23/2015   Chronic low back pain 06/21/2014   Pain in joint, shoulder region 02/11/2014   Vitamin D deficiency 03/30/2013   Acute kidney injury (HCC) 01/01/2012   Spinal stenosis 12/23/2011   Osteopenia 12/23/2011   Renal insufficiency, mild 08/24/2011   CAP (community acquired pneumonia) 05/08/2011   Vitamin B12 deficiency 09/24/2010   IBS (irritable bowel syndrome) 09/24/2010   Peripheral neuropathy 09/24/2010   Normocytic anemia 09/24/2010   GERD (gastroesophageal reflux disease) 08/12/2009   DIVERTICULOSIS, COLON 09/08/2007   Angiodysplasia of intestine with hemorrhage 09/08/2007   DJD (degenerative joint disease) 09/08/2007   COLONIC POLYPS, ADENOMATOUS, HX OF 09/08/2007   HYPERLIPIDEMIA 01/23/2007   HTN (hypertension) 01/23/2007   Osteoporosis 01/23/2007   HYPOGLYCEMIA, REACTIVE 06/30/2006   Past Medical History:  Diagnosis Date   Anemia    hx of    Angiodysplasia 2007   @ colonoscopy   Anxiety    PMH of   Chronic low back pain 06/21/2014   Diverticulosis of colon (without mention of hemorrhage)    DJD (degenerative joint disease)    Esophageal reflux    inactive   History of vertebral fracture 11/2015   HTN (hypertension)    Hyperlipemia 2006   LDL 130   Hypoglycemia    reactive   Pelvic fracture (HCC) 12/30/2011   [facility]   Peripheral neuropathy    Personal history of colonic polyps  adenomatous   Vitamin B12 deficiency    Past Surgical History:  Procedure Laterality Date   cataract surgery  12-26-12 and 01-09-13   COLONOSCOPY  2011   neg   COLONOSCOPY W/ POLYPECTOMY     X 2 , Dr  Sheryn Bison; angiodysplasia. Due 2022   DILATION AND CURETTAGE OF UTERUS     FACIAL COSMETIC SURGERY     HEMORROIDECTOMY     LUMBAR LAMINECTOMY/DECOMPRESSION MICRODISCECTOMY  09/10/2011   Procedure: LUMBAR LAMINECTOMY/DECOMPRESSION MICRODISCECTOMY;  Surgeon: Javier Docker, MD;  Location: [facility];  Service: Orthopedics;  Laterality: N/A;  decompression laminectomy L2-3, L3-4, L4-5   ORIF WRIST FRACTURE  01/11/2012   Procedure: OPEN REDUCTION INTERNAL FIXATION (ORIF) WRIST FRACTURE;  Surgeon: Tami Ribas, MD;  Location: [facility];  Service: Orthopedics;  Laterality: Right;   TEAR DUCT PROBING     X 2   TOTAL HIP ARTHROPLASTY  2000   right   TUBAL LIGATION     Social History   Tobacco Use   Smoking status: Never   Smokeless tobacco: Never  Substance Use Topics   Alcohol use: Yes    Alcohol/week: 3.0 standard drinks of alcohol    Types: 3 Glasses of wine per week    Comment: wine occasionally   Drug use: No   Social History   Socioeconomic History   Marital status: Married    Spouse name: Peyton Najjar   Number of children: 1   Years of education: hs   Highest education level: 12th grade  Occupational History   Occupation: retired    Associate Professor: RETIRED  Tobacco Use   Smoking status: Never   Smokeless tobacco: Never  Substance and Sexual Activity   Alcohol use: Yes    Alcohol/week: 3.0 standard drinks of alcohol    Types: 3 Glasses of wine per week    Comment: wine occasionally   Drug use: No   Sexual activity: Yes    Birth control/protection: Diaphragm  Other Topics Concern   Not on file  Social History Narrative   Patient is right handed.   Patient does not drink caffeine.   Lives with husband   Lucila [place] lives nearby   Social Determinants of Health   Financial Resource Strain: Low Risk  (09/21/2022)   Overall Financial Resource Strain (CARDIA)    Difficulty of Paying Living Expenses: Not hard at all  Food Insecurity: No Food Insecurity (09/21/2022)   Hunger Vital Sign    Worried About Running Out of Food in the Last Year: Never true    Ran Out of Food in the Last Year: Never true  Transportation Needs: No Transportation Needs (09/21/2022)   PRAPARE - Administrator, Civil Service (Medical): No    Lack of  Transportation (Non-Medical): No  Physical Activity: Unknown (09/21/2022)   Exercise Vital Sign    Days of Exercise per Week: 0 days    Minutes of Exercise per Session: Not on file  Stress: No Stress Concern Present (09/21/2022)   [employer] of Occupational Health - Occupational Stress Questionnaire    Feeling of Stress : Only a little  Social Connections: Unknown (09/21/2022)   Social Connection and Isolation Panel [NHANES]    Frequency of Communication with Friends and Family: Once a week    Frequency of Social Gatherings with Friends and Family: Once a week    Attends Religious Services: Never    Database administrator or Organizations: Not on file  Attends Banker Meetings: Not on file    Marital Status: Married  Intimate Partner Violence: Not At Risk (12/27/2020)   Humiliation, Afraid, Rape, and Kick questionnaire    Fear of Current or Ex-Partner: No    Emotionally Abused: No    Physically Abused: No    Sexually Abused: No   Family Status  Relation Name Status   Father  Deceased   Mother  Deceased   Sister  (Not Specified)   Brother Remi Deter Deceased   Brother arthur Deceased   Mat Education officer, community  (Not Specified)   Brother  Deceased   Daughter  (Not Specified)   Brother Paramedic Alive   Neg Hx  (Not Specified)   Family History  Problem Relation Age of Onset   Heart attack Father 60   Kidney disease Mother        renal failure   Throat cancer Sister        smoker   Osteoporosis Sister    Heart attack Brother 69   Stroke Brother 47       smoker   Depression Maternal Uncle    Cancer Brother        X3  lung cancer, all smokers   Peripheral vascular disease Daughter    Colon cancer Neg Hx    Diabetes Neg Hx       ROS Review of Systems  Constitutional: Negative for activity change, appetite change and fatigue.  HENT: Negative for hearing loss, congestion, tinnitus and ear discharge.  dentist q81m Respiratory: Negative for cough, chest tightness and  shortness of breath.   Cardiovascular: Negative for chest pain, palpitations and leg swelling.  Gastrointestinal: Negative for abdominal pain, diarrhea, constipation and abdominal distention.  Genitourinary: Negative for urgency, frequency, decreased urine volume and difficulty urinating.  Musculoskeletal: walks with walker Skin: wound-- L low ext ,  Neurological: Negative for dizziness, light-headedness, numbness and headaches.  Hematological: Negative for adenopathy. Does not bruise/bleed easily.       Objective:     BP 108/80 (BP Location: Left Arm, Patient Position: Sitting, Cuff Size: Normal)   Pulse 80   Temp 98.1 F (36.7 C) (Oral)   Resp 18   Ht 5\' 5"  (1.651 m)   SpO2 98%   BMI 22.47 kg/m  BP Readings from Last 3 Encounters:  09/28/22 108/80  09/22/22 130/64  08/19/22 118/84   Wt Readings from Last 3 Encounters:  09/22/22 135 lb (61.2 kg)  08/19/22 139 lb 12.8 oz (63.4 kg)  06/02/22 144 lb 4.8 oz (65.5 kg)   SpO2 Readings from Last 3 Encounters:  09/28/22 98%  09/22/22 98%  08/19/22 95%      Physical Exam L low ext----  _ abrasion, errythema, blisters with infection  No results found for any visits on 09/28/22.  Last CBC Lab Results  Component Value Date   WBC 5.8 09/22/2022   HGB 11.9 (L) 09/22/2022   HCT 38.0 09/22/2022   MCV 91.4 09/22/2022   MCH 30.5 02/12/2022   RDW 16.2 (H) 09/22/2022   PLT 277.0 09/22/2022   Last metabolic panel Lab Results  Component Value Date   GLUCOSE 86 09/16/2022   NA 136 09/16/2022   K 4.6 09/16/2022   CL 97 09/16/2022   CO2 29 09/16/2022   BUN 30 (H) 09/16/2022   CREATININE 1.96 (H) 09/16/2022   GFRNONAA 17 (L) 07/31/2021   CALCIUM 9.6 09/16/2022   PHOS 4.9 (H) 07/31/2021   PROT 7.1 09/16/2022   ALBUMIN  4.0 09/16/2022   BILITOT 0.8 09/16/2022   ALKPHOS 58 09/16/2022   AST 13 09/16/2022   ALT 9 09/16/2022   ANIONGAP 11 07/31/2021   Last lipids Lab Results  Component Value Date   CHOL 160 08/19/2022    HDL 56.60 08/19/2022   LDLCALC 88 08/19/2022   LDLDIRECT 129.0 10/18/2014   TRIG 74.0 08/19/2022   CHOLHDL 3 08/19/2022   Last hemoglobin A1c Lab Results  Component Value Date   HGBA1C 5.7 (H) 11/09/2019   Last thyroid functions Lab Results  Component Value Date   TSH 3.311 07/23/2021   Last vitamin D Lab Results  Component Value Date   VD25OH 74.63 08/19/2022      The ASCVD Risk score (Arnett DK, et al., 2019) failed to calculate for the following reasons:   The 2019 ASCVD risk score is only valid for ages 58 to 64    Assessment & Plan:   Problem List Items Addressed This Visit       Unprioritized   Delayed wound healing - Primary    L leg Non stick bandages used to dress wound Con't with abx -- bactrim F/u next week       Relevant Orders   Ambulatory referral to Home Health    No follow-ups on file.    Donato Schultz, DO

## 2022-09-30 DIAGNOSIS — C44629 Squamous cell carcinoma of skin of left upper limb, including shoulder: Secondary | ICD-10-CM | POA: Diagnosis not present

## 2022-09-30 DIAGNOSIS — L570 Actinic keratosis: Secondary | ICD-10-CM | POA: Diagnosis not present

## 2022-09-30 DIAGNOSIS — L814 Other melanin hyperpigmentation: Secondary | ICD-10-CM | POA: Diagnosis not present

## 2022-09-30 DIAGNOSIS — I872 Venous insufficiency (chronic) (peripheral): Secondary | ICD-10-CM | POA: Diagnosis not present

## 2022-09-30 DIAGNOSIS — D492 Neoplasm of unspecified behavior of bone, soft tissue, and skin: Secondary | ICD-10-CM | POA: Diagnosis not present

## 2022-09-30 DIAGNOSIS — L821 Other seborrheic keratosis: Secondary | ICD-10-CM | POA: Diagnosis not present

## 2022-10-14 DIAGNOSIS — I2699 Other pulmonary embolism without acute cor pulmonale: Secondary | ICD-10-CM | POA: Diagnosis not present

## 2022-10-14 DIAGNOSIS — I872 Venous insufficiency (chronic) (peripheral): Secondary | ICD-10-CM | POA: Diagnosis not present

## 2022-10-14 DIAGNOSIS — I130 Hypertensive heart and chronic kidney disease with heart failure and stage 1 through stage 4 chronic kidney disease, or unspecified chronic kidney disease: Secondary | ICD-10-CM | POA: Diagnosis not present

## 2022-10-14 DIAGNOSIS — E538 Deficiency of other specified B group vitamins: Secondary | ICD-10-CM | POA: Diagnosis not present

## 2022-10-14 DIAGNOSIS — N184 Chronic kidney disease, stage 4 (severe): Secondary | ICD-10-CM | POA: Diagnosis not present

## 2022-10-14 DIAGNOSIS — G629 Polyneuropathy, unspecified: Secondary | ICD-10-CM | POA: Diagnosis not present

## 2022-10-14 DIAGNOSIS — D631 Anemia in chronic kidney disease: Secondary | ICD-10-CM | POA: Diagnosis not present

## 2022-10-14 DIAGNOSIS — Z556 Problems related to health literacy: Secondary | ICD-10-CM | POA: Diagnosis not present

## 2022-10-14 DIAGNOSIS — I5043 Acute on chronic combined systolic (congestive) and diastolic (congestive) heart failure: Secondary | ICD-10-CM | POA: Diagnosis not present

## 2022-10-14 DIAGNOSIS — M545 Low back pain, unspecified: Secondary | ICD-10-CM | POA: Diagnosis not present

## 2022-10-14 DIAGNOSIS — G8929 Other chronic pain: Secondary | ICD-10-CM | POA: Diagnosis not present

## 2022-10-14 DIAGNOSIS — S80812D Abrasion, left lower leg, subsequent encounter: Secondary | ICD-10-CM | POA: Diagnosis not present

## 2022-10-14 DIAGNOSIS — Z7901 Long term (current) use of anticoagulants: Secondary | ICD-10-CM | POA: Diagnosis not present

## 2022-10-14 DIAGNOSIS — D509 Iron deficiency anemia, unspecified: Secondary | ICD-10-CM | POA: Diagnosis not present

## 2022-10-14 DIAGNOSIS — L089 Local infection of the skin and subcutaneous tissue, unspecified: Secondary | ICD-10-CM | POA: Diagnosis not present

## 2022-10-14 DIAGNOSIS — M810 Age-related osteoporosis without current pathological fracture: Secondary | ICD-10-CM | POA: Diagnosis not present

## 2022-10-14 DIAGNOSIS — I480 Paroxysmal atrial fibrillation: Secondary | ICD-10-CM | POA: Diagnosis not present

## 2022-10-19 DIAGNOSIS — E538 Deficiency of other specified B group vitamins: Secondary | ICD-10-CM | POA: Diagnosis not present

## 2022-10-19 DIAGNOSIS — I2699 Other pulmonary embolism without acute cor pulmonale: Secondary | ICD-10-CM | POA: Diagnosis not present

## 2022-10-19 DIAGNOSIS — L97829 Non-pressure chronic ulcer of other part of left lower leg with unspecified severity: Secondary | ICD-10-CM | POA: Diagnosis not present

## 2022-10-19 DIAGNOSIS — D509 Iron deficiency anemia, unspecified: Secondary | ICD-10-CM | POA: Diagnosis not present

## 2022-10-19 DIAGNOSIS — G8929 Other chronic pain: Secondary | ICD-10-CM | POA: Diagnosis not present

## 2022-10-19 DIAGNOSIS — G629 Polyneuropathy, unspecified: Secondary | ICD-10-CM | POA: Diagnosis not present

## 2022-10-19 DIAGNOSIS — Z556 Problems related to health literacy: Secondary | ICD-10-CM | POA: Diagnosis not present

## 2022-10-19 DIAGNOSIS — L089 Local infection of the skin and subcutaneous tissue, unspecified: Secondary | ICD-10-CM | POA: Diagnosis not present

## 2022-10-19 DIAGNOSIS — M545 Low back pain, unspecified: Secondary | ICD-10-CM | POA: Diagnosis not present

## 2022-10-19 DIAGNOSIS — I480 Paroxysmal atrial fibrillation: Secondary | ICD-10-CM | POA: Diagnosis not present

## 2022-10-19 DIAGNOSIS — I5043 Acute on chronic combined systolic (congestive) and diastolic (congestive) heart failure: Secondary | ICD-10-CM | POA: Diagnosis not present

## 2022-10-19 DIAGNOSIS — Z7901 Long term (current) use of anticoagulants: Secondary | ICD-10-CM | POA: Diagnosis not present

## 2022-10-19 DIAGNOSIS — S80812D Abrasion, left lower leg, subsequent encounter: Secondary | ICD-10-CM | POA: Diagnosis not present

## 2022-10-19 DIAGNOSIS — I872 Venous insufficiency (chronic) (peripheral): Secondary | ICD-10-CM | POA: Diagnosis not present

## 2022-10-19 DIAGNOSIS — M810 Age-related osteoporosis without current pathological fracture: Secondary | ICD-10-CM | POA: Diagnosis not present

## 2022-10-19 DIAGNOSIS — D631 Anemia in chronic kidney disease: Secondary | ICD-10-CM | POA: Diagnosis not present

## 2022-10-19 DIAGNOSIS — I87312 Chronic venous hypertension (idiopathic) with ulcer of left lower extremity: Secondary | ICD-10-CM | POA: Diagnosis not present

## 2022-10-19 DIAGNOSIS — I130 Hypertensive heart and chronic kidney disease with heart failure and stage 1 through stage 4 chronic kidney disease, or unspecified chronic kidney disease: Secondary | ICD-10-CM | POA: Diagnosis not present

## 2022-10-19 DIAGNOSIS — N184 Chronic kidney disease, stage 4 (severe): Secondary | ICD-10-CM | POA: Diagnosis not present

## 2022-10-22 DIAGNOSIS — M810 Age-related osteoporosis without current pathological fracture: Secondary | ICD-10-CM | POA: Diagnosis not present

## 2022-10-22 DIAGNOSIS — S80812D Abrasion, left lower leg, subsequent encounter: Secondary | ICD-10-CM | POA: Diagnosis not present

## 2022-10-22 DIAGNOSIS — E538 Deficiency of other specified B group vitamins: Secondary | ICD-10-CM | POA: Diagnosis not present

## 2022-10-22 DIAGNOSIS — I130 Hypertensive heart and chronic kidney disease with heart failure and stage 1 through stage 4 chronic kidney disease, or unspecified chronic kidney disease: Secondary | ICD-10-CM | POA: Diagnosis not present

## 2022-10-22 DIAGNOSIS — I872 Venous insufficiency (chronic) (peripheral): Secondary | ICD-10-CM | POA: Diagnosis not present

## 2022-10-22 DIAGNOSIS — I5043 Acute on chronic combined systolic (congestive) and diastolic (congestive) heart failure: Secondary | ICD-10-CM | POA: Diagnosis not present

## 2022-10-22 DIAGNOSIS — Z556 Problems related to health literacy: Secondary | ICD-10-CM | POA: Diagnosis not present

## 2022-10-22 DIAGNOSIS — G8929 Other chronic pain: Secondary | ICD-10-CM | POA: Diagnosis not present

## 2022-10-22 DIAGNOSIS — N184 Chronic kidney disease, stage 4 (severe): Secondary | ICD-10-CM | POA: Diagnosis not present

## 2022-10-22 DIAGNOSIS — D631 Anemia in chronic kidney disease: Secondary | ICD-10-CM | POA: Diagnosis not present

## 2022-10-22 DIAGNOSIS — G629 Polyneuropathy, unspecified: Secondary | ICD-10-CM | POA: Diagnosis not present

## 2022-10-22 DIAGNOSIS — L089 Local infection of the skin and subcutaneous tissue, unspecified: Secondary | ICD-10-CM | POA: Diagnosis not present

## 2022-10-22 DIAGNOSIS — I480 Paroxysmal atrial fibrillation: Secondary | ICD-10-CM | POA: Diagnosis not present

## 2022-10-22 DIAGNOSIS — Z7901 Long term (current) use of anticoagulants: Secondary | ICD-10-CM | POA: Diagnosis not present

## 2022-10-22 DIAGNOSIS — M545 Low back pain, unspecified: Secondary | ICD-10-CM | POA: Diagnosis not present

## 2022-10-22 DIAGNOSIS — I2699 Other pulmonary embolism without acute cor pulmonale: Secondary | ICD-10-CM | POA: Diagnosis not present

## 2022-10-22 DIAGNOSIS — D509 Iron deficiency anemia, unspecified: Secondary | ICD-10-CM | POA: Diagnosis not present

## 2022-10-26 DIAGNOSIS — I872 Venous insufficiency (chronic) (peripheral): Secondary | ICD-10-CM | POA: Diagnosis not present

## 2022-10-26 DIAGNOSIS — I5043 Acute on chronic combined systolic (congestive) and diastolic (congestive) heart failure: Secondary | ICD-10-CM | POA: Diagnosis not present

## 2022-10-26 DIAGNOSIS — I2699 Other pulmonary embolism without acute cor pulmonale: Secondary | ICD-10-CM | POA: Diagnosis not present

## 2022-10-26 DIAGNOSIS — D631 Anemia in chronic kidney disease: Secondary | ICD-10-CM | POA: Diagnosis not present

## 2022-10-26 DIAGNOSIS — S80812D Abrasion, left lower leg, subsequent encounter: Secondary | ICD-10-CM | POA: Diagnosis not present

## 2022-10-26 DIAGNOSIS — E538 Deficiency of other specified B group vitamins: Secondary | ICD-10-CM | POA: Diagnosis not present

## 2022-10-26 DIAGNOSIS — I480 Paroxysmal atrial fibrillation: Secondary | ICD-10-CM | POA: Diagnosis not present

## 2022-10-26 DIAGNOSIS — N184 Chronic kidney disease, stage 4 (severe): Secondary | ICD-10-CM | POA: Diagnosis not present

## 2022-10-26 DIAGNOSIS — M545 Low back pain, unspecified: Secondary | ICD-10-CM | POA: Diagnosis not present

## 2022-10-26 DIAGNOSIS — G629 Polyneuropathy, unspecified: Secondary | ICD-10-CM | POA: Diagnosis not present

## 2022-10-26 DIAGNOSIS — Z7901 Long term (current) use of anticoagulants: Secondary | ICD-10-CM | POA: Diagnosis not present

## 2022-10-26 DIAGNOSIS — G8929 Other chronic pain: Secondary | ICD-10-CM | POA: Diagnosis not present

## 2022-10-26 DIAGNOSIS — L089 Local infection of the skin and subcutaneous tissue, unspecified: Secondary | ICD-10-CM | POA: Diagnosis not present

## 2022-10-26 DIAGNOSIS — D509 Iron deficiency anemia, unspecified: Secondary | ICD-10-CM | POA: Diagnosis not present

## 2022-10-26 DIAGNOSIS — M810 Age-related osteoporosis without current pathological fracture: Secondary | ICD-10-CM | POA: Diagnosis not present

## 2022-10-26 DIAGNOSIS — Z556 Problems related to health literacy: Secondary | ICD-10-CM | POA: Diagnosis not present

## 2022-10-26 DIAGNOSIS — I130 Hypertensive heart and chronic kidney disease with heart failure and stage 1 through stage 4 chronic kidney disease, or unspecified chronic kidney disease: Secondary | ICD-10-CM | POA: Diagnosis not present

## 2022-10-29 DIAGNOSIS — Z7901 Long term (current) use of anticoagulants: Secondary | ICD-10-CM | POA: Diagnosis not present

## 2022-10-29 DIAGNOSIS — M810 Age-related osteoporosis without current pathological fracture: Secondary | ICD-10-CM | POA: Diagnosis not present

## 2022-10-29 DIAGNOSIS — I130 Hypertensive heart and chronic kidney disease with heart failure and stage 1 through stage 4 chronic kidney disease, or unspecified chronic kidney disease: Secondary | ICD-10-CM | POA: Diagnosis not present

## 2022-10-29 DIAGNOSIS — I5043 Acute on chronic combined systolic (congestive) and diastolic (congestive) heart failure: Secondary | ICD-10-CM | POA: Diagnosis not present

## 2022-10-29 DIAGNOSIS — I2699 Other pulmonary embolism without acute cor pulmonale: Secondary | ICD-10-CM | POA: Diagnosis not present

## 2022-10-29 DIAGNOSIS — D509 Iron deficiency anemia, unspecified: Secondary | ICD-10-CM | POA: Diagnosis not present

## 2022-10-29 DIAGNOSIS — Z556 Problems related to health literacy: Secondary | ICD-10-CM | POA: Diagnosis not present

## 2022-10-29 DIAGNOSIS — E538 Deficiency of other specified B group vitamins: Secondary | ICD-10-CM | POA: Diagnosis not present

## 2022-10-29 DIAGNOSIS — G629 Polyneuropathy, unspecified: Secondary | ICD-10-CM | POA: Diagnosis not present

## 2022-10-29 DIAGNOSIS — I872 Venous insufficiency (chronic) (peripheral): Secondary | ICD-10-CM | POA: Diagnosis not present

## 2022-10-29 DIAGNOSIS — G8929 Other chronic pain: Secondary | ICD-10-CM | POA: Diagnosis not present

## 2022-10-29 DIAGNOSIS — M545 Low back pain, unspecified: Secondary | ICD-10-CM | POA: Diagnosis not present

## 2022-10-29 DIAGNOSIS — D631 Anemia in chronic kidney disease: Secondary | ICD-10-CM | POA: Diagnosis not present

## 2022-10-29 DIAGNOSIS — N184 Chronic kidney disease, stage 4 (severe): Secondary | ICD-10-CM | POA: Diagnosis not present

## 2022-10-29 DIAGNOSIS — I480 Paroxysmal atrial fibrillation: Secondary | ICD-10-CM | POA: Diagnosis not present

## 2022-10-29 DIAGNOSIS — L089 Local infection of the skin and subcutaneous tissue, unspecified: Secondary | ICD-10-CM | POA: Diagnosis not present

## 2022-10-29 DIAGNOSIS — S80812D Abrasion, left lower leg, subsequent encounter: Secondary | ICD-10-CM | POA: Diagnosis not present

## 2022-11-02 DIAGNOSIS — I5043 Acute on chronic combined systolic (congestive) and diastolic (congestive) heart failure: Secondary | ICD-10-CM | POA: Diagnosis not present

## 2022-11-02 DIAGNOSIS — E538 Deficiency of other specified B group vitamins: Secondary | ICD-10-CM | POA: Diagnosis not present

## 2022-11-02 DIAGNOSIS — L089 Local infection of the skin and subcutaneous tissue, unspecified: Secondary | ICD-10-CM | POA: Diagnosis not present

## 2022-11-02 DIAGNOSIS — S80812D Abrasion, left lower leg, subsequent encounter: Secondary | ICD-10-CM | POA: Diagnosis not present

## 2022-11-02 DIAGNOSIS — M545 Low back pain, unspecified: Secondary | ICD-10-CM | POA: Diagnosis not present

## 2022-11-02 DIAGNOSIS — I480 Paroxysmal atrial fibrillation: Secondary | ICD-10-CM | POA: Diagnosis not present

## 2022-11-02 DIAGNOSIS — I872 Venous insufficiency (chronic) (peripheral): Secondary | ICD-10-CM | POA: Diagnosis not present

## 2022-11-02 DIAGNOSIS — G8929 Other chronic pain: Secondary | ICD-10-CM | POA: Diagnosis not present

## 2022-11-02 DIAGNOSIS — I130 Hypertensive heart and chronic kidney disease with heart failure and stage 1 through stage 4 chronic kidney disease, or unspecified chronic kidney disease: Secondary | ICD-10-CM | POA: Diagnosis not present

## 2022-11-02 DIAGNOSIS — I2699 Other pulmonary embolism without acute cor pulmonale: Secondary | ICD-10-CM | POA: Diagnosis not present

## 2022-11-02 DIAGNOSIS — D509 Iron deficiency anemia, unspecified: Secondary | ICD-10-CM | POA: Diagnosis not present

## 2022-11-02 DIAGNOSIS — Z556 Problems related to health literacy: Secondary | ICD-10-CM | POA: Diagnosis not present

## 2022-11-02 DIAGNOSIS — Z7901 Long term (current) use of anticoagulants: Secondary | ICD-10-CM | POA: Diagnosis not present

## 2022-11-02 DIAGNOSIS — D631 Anemia in chronic kidney disease: Secondary | ICD-10-CM | POA: Diagnosis not present

## 2022-11-02 DIAGNOSIS — G629 Polyneuropathy, unspecified: Secondary | ICD-10-CM | POA: Diagnosis not present

## 2022-11-02 DIAGNOSIS — M810 Age-related osteoporosis without current pathological fracture: Secondary | ICD-10-CM | POA: Diagnosis not present

## 2022-11-02 DIAGNOSIS — N184 Chronic kidney disease, stage 4 (severe): Secondary | ICD-10-CM | POA: Diagnosis not present

## 2022-11-04 DIAGNOSIS — C44729 Squamous cell carcinoma of skin of left lower limb, including hip: Secondary | ICD-10-CM | POA: Diagnosis not present

## 2022-11-04 DIAGNOSIS — I872 Venous insufficiency (chronic) (peripheral): Secondary | ICD-10-CM | POA: Diagnosis not present

## 2022-11-04 DIAGNOSIS — L570 Actinic keratosis: Secondary | ICD-10-CM | POA: Diagnosis not present

## 2022-11-05 DIAGNOSIS — D509 Iron deficiency anemia, unspecified: Secondary | ICD-10-CM | POA: Diagnosis not present

## 2022-11-05 DIAGNOSIS — S80812D Abrasion, left lower leg, subsequent encounter: Secondary | ICD-10-CM | POA: Diagnosis not present

## 2022-11-05 DIAGNOSIS — M545 Low back pain, unspecified: Secondary | ICD-10-CM | POA: Diagnosis not present

## 2022-11-05 DIAGNOSIS — I5043 Acute on chronic combined systolic (congestive) and diastolic (congestive) heart failure: Secondary | ICD-10-CM | POA: Diagnosis not present

## 2022-11-05 DIAGNOSIS — I2699 Other pulmonary embolism without acute cor pulmonale: Secondary | ICD-10-CM | POA: Diagnosis not present

## 2022-11-05 DIAGNOSIS — D631 Anemia in chronic kidney disease: Secondary | ICD-10-CM | POA: Diagnosis not present

## 2022-11-05 DIAGNOSIS — G8929 Other chronic pain: Secondary | ICD-10-CM | POA: Diagnosis not present

## 2022-11-05 DIAGNOSIS — I480 Paroxysmal atrial fibrillation: Secondary | ICD-10-CM | POA: Diagnosis not present

## 2022-11-05 DIAGNOSIS — L089 Local infection of the skin and subcutaneous tissue, unspecified: Secondary | ICD-10-CM | POA: Diagnosis not present

## 2022-11-05 DIAGNOSIS — I130 Hypertensive heart and chronic kidney disease with heart failure and stage 1 through stage 4 chronic kidney disease, or unspecified chronic kidney disease: Secondary | ICD-10-CM | POA: Diagnosis not present

## 2022-11-05 DIAGNOSIS — Z7901 Long term (current) use of anticoagulants: Secondary | ICD-10-CM | POA: Diagnosis not present

## 2022-11-05 DIAGNOSIS — G629 Polyneuropathy, unspecified: Secondary | ICD-10-CM | POA: Diagnosis not present

## 2022-11-05 DIAGNOSIS — E538 Deficiency of other specified B group vitamins: Secondary | ICD-10-CM | POA: Diagnosis not present

## 2022-11-05 DIAGNOSIS — N184 Chronic kidney disease, stage 4 (severe): Secondary | ICD-10-CM | POA: Diagnosis not present

## 2022-11-05 DIAGNOSIS — I872 Venous insufficiency (chronic) (peripheral): Secondary | ICD-10-CM | POA: Diagnosis not present

## 2022-11-05 DIAGNOSIS — M810 Age-related osteoporosis without current pathological fracture: Secondary | ICD-10-CM | POA: Diagnosis not present

## 2022-11-05 DIAGNOSIS — Z556 Problems related to health literacy: Secondary | ICD-10-CM | POA: Diagnosis not present

## 2022-11-09 DIAGNOSIS — I480 Paroxysmal atrial fibrillation: Secondary | ICD-10-CM | POA: Diagnosis not present

## 2022-11-09 DIAGNOSIS — M810 Age-related osteoporosis without current pathological fracture: Secondary | ICD-10-CM | POA: Diagnosis not present

## 2022-11-09 DIAGNOSIS — Z7901 Long term (current) use of anticoagulants: Secondary | ICD-10-CM | POA: Diagnosis not present

## 2022-11-09 DIAGNOSIS — G8929 Other chronic pain: Secondary | ICD-10-CM | POA: Diagnosis not present

## 2022-11-09 DIAGNOSIS — D631 Anemia in chronic kidney disease: Secondary | ICD-10-CM | POA: Diagnosis not present

## 2022-11-09 DIAGNOSIS — Z556 Problems related to health literacy: Secondary | ICD-10-CM | POA: Diagnosis not present

## 2022-11-09 DIAGNOSIS — M545 Low back pain, unspecified: Secondary | ICD-10-CM | POA: Diagnosis not present

## 2022-11-09 DIAGNOSIS — I5043 Acute on chronic combined systolic (congestive) and diastolic (congestive) heart failure: Secondary | ICD-10-CM | POA: Diagnosis not present

## 2022-11-09 DIAGNOSIS — S80812D Abrasion, left lower leg, subsequent encounter: Secondary | ICD-10-CM | POA: Diagnosis not present

## 2022-11-09 DIAGNOSIS — I130 Hypertensive heart and chronic kidney disease with heart failure and stage 1 through stage 4 chronic kidney disease, or unspecified chronic kidney disease: Secondary | ICD-10-CM | POA: Diagnosis not present

## 2022-11-09 DIAGNOSIS — D509 Iron deficiency anemia, unspecified: Secondary | ICD-10-CM | POA: Diagnosis not present

## 2022-11-09 DIAGNOSIS — G629 Polyneuropathy, unspecified: Secondary | ICD-10-CM | POA: Diagnosis not present

## 2022-11-09 DIAGNOSIS — N184 Chronic kidney disease, stage 4 (severe): Secondary | ICD-10-CM | POA: Diagnosis not present

## 2022-11-09 DIAGNOSIS — L089 Local infection of the skin and subcutaneous tissue, unspecified: Secondary | ICD-10-CM | POA: Diagnosis not present

## 2022-11-09 DIAGNOSIS — I872 Venous insufficiency (chronic) (peripheral): Secondary | ICD-10-CM | POA: Diagnosis not present

## 2022-11-09 DIAGNOSIS — I2699 Other pulmonary embolism without acute cor pulmonale: Secondary | ICD-10-CM | POA: Diagnosis not present

## 2022-11-09 DIAGNOSIS — E538 Deficiency of other specified B group vitamins: Secondary | ICD-10-CM | POA: Diagnosis not present

## 2022-11-12 DIAGNOSIS — N184 Chronic kidney disease, stage 4 (severe): Secondary | ICD-10-CM | POA: Diagnosis not present

## 2022-11-12 DIAGNOSIS — I5043 Acute on chronic combined systolic (congestive) and diastolic (congestive) heart failure: Secondary | ICD-10-CM | POA: Diagnosis not present

## 2022-11-12 DIAGNOSIS — E538 Deficiency of other specified B group vitamins: Secondary | ICD-10-CM | POA: Diagnosis not present

## 2022-11-12 DIAGNOSIS — S80812D Abrasion, left lower leg, subsequent encounter: Secondary | ICD-10-CM | POA: Diagnosis not present

## 2022-11-12 DIAGNOSIS — I2699 Other pulmonary embolism without acute cor pulmonale: Secondary | ICD-10-CM | POA: Diagnosis not present

## 2022-11-12 DIAGNOSIS — Z7901 Long term (current) use of anticoagulants: Secondary | ICD-10-CM | POA: Diagnosis not present

## 2022-11-12 DIAGNOSIS — Z556 Problems related to health literacy: Secondary | ICD-10-CM | POA: Diagnosis not present

## 2022-11-12 DIAGNOSIS — D509 Iron deficiency anemia, unspecified: Secondary | ICD-10-CM | POA: Diagnosis not present

## 2022-11-12 DIAGNOSIS — I480 Paroxysmal atrial fibrillation: Secondary | ICD-10-CM | POA: Diagnosis not present

## 2022-11-12 DIAGNOSIS — G629 Polyneuropathy, unspecified: Secondary | ICD-10-CM | POA: Diagnosis not present

## 2022-11-12 DIAGNOSIS — M810 Age-related osteoporosis without current pathological fracture: Secondary | ICD-10-CM | POA: Diagnosis not present

## 2022-11-12 DIAGNOSIS — I130 Hypertensive heart and chronic kidney disease with heart failure and stage 1 through stage 4 chronic kidney disease, or unspecified chronic kidney disease: Secondary | ICD-10-CM | POA: Diagnosis not present

## 2022-11-12 DIAGNOSIS — L089 Local infection of the skin and subcutaneous tissue, unspecified: Secondary | ICD-10-CM | POA: Diagnosis not present

## 2022-11-12 DIAGNOSIS — M545 Low back pain, unspecified: Secondary | ICD-10-CM | POA: Diagnosis not present

## 2022-11-12 DIAGNOSIS — I872 Venous insufficiency (chronic) (peripheral): Secondary | ICD-10-CM | POA: Diagnosis not present

## 2022-11-12 DIAGNOSIS — D631 Anemia in chronic kidney disease: Secondary | ICD-10-CM | POA: Diagnosis not present

## 2022-11-12 DIAGNOSIS — G8929 Other chronic pain: Secondary | ICD-10-CM | POA: Diagnosis not present

## 2022-11-16 DIAGNOSIS — E538 Deficiency of other specified B group vitamins: Secondary | ICD-10-CM | POA: Diagnosis not present

## 2022-11-16 DIAGNOSIS — S80812D Abrasion, left lower leg, subsequent encounter: Secondary | ICD-10-CM | POA: Diagnosis not present

## 2022-11-16 DIAGNOSIS — M545 Low back pain, unspecified: Secondary | ICD-10-CM | POA: Diagnosis not present

## 2022-11-16 DIAGNOSIS — L089 Local infection of the skin and subcutaneous tissue, unspecified: Secondary | ICD-10-CM | POA: Diagnosis not present

## 2022-11-16 DIAGNOSIS — M810 Age-related osteoporosis without current pathological fracture: Secondary | ICD-10-CM | POA: Diagnosis not present

## 2022-11-16 DIAGNOSIS — D631 Anemia in chronic kidney disease: Secondary | ICD-10-CM | POA: Diagnosis not present

## 2022-11-16 DIAGNOSIS — I872 Venous insufficiency (chronic) (peripheral): Secondary | ICD-10-CM | POA: Diagnosis not present

## 2022-11-16 DIAGNOSIS — G629 Polyneuropathy, unspecified: Secondary | ICD-10-CM | POA: Diagnosis not present

## 2022-11-16 DIAGNOSIS — I5043 Acute on chronic combined systolic (congestive) and diastolic (congestive) heart failure: Secondary | ICD-10-CM | POA: Diagnosis not present

## 2022-11-16 DIAGNOSIS — I130 Hypertensive heart and chronic kidney disease with heart failure and stage 1 through stage 4 chronic kidney disease, or unspecified chronic kidney disease: Secondary | ICD-10-CM | POA: Diagnosis not present

## 2022-11-16 DIAGNOSIS — I480 Paroxysmal atrial fibrillation: Secondary | ICD-10-CM | POA: Diagnosis not present

## 2022-11-16 DIAGNOSIS — Z7901 Long term (current) use of anticoagulants: Secondary | ICD-10-CM | POA: Diagnosis not present

## 2022-11-16 DIAGNOSIS — I2699 Other pulmonary embolism without acute cor pulmonale: Secondary | ICD-10-CM | POA: Diagnosis not present

## 2022-11-16 DIAGNOSIS — Z556 Problems related to health literacy: Secondary | ICD-10-CM | POA: Diagnosis not present

## 2022-11-16 DIAGNOSIS — D509 Iron deficiency anemia, unspecified: Secondary | ICD-10-CM | POA: Diagnosis not present

## 2022-11-16 DIAGNOSIS — G8929 Other chronic pain: Secondary | ICD-10-CM | POA: Diagnosis not present

## 2022-11-16 DIAGNOSIS — N184 Chronic kidney disease, stage 4 (severe): Secondary | ICD-10-CM | POA: Diagnosis not present

## 2022-11-17 DIAGNOSIS — L97921 Non-pressure chronic ulcer of unspecified part of left lower leg limited to breakdown of skin: Secondary | ICD-10-CM | POA: Diagnosis not present

## 2022-11-19 DIAGNOSIS — D631 Anemia in chronic kidney disease: Secondary | ICD-10-CM | POA: Diagnosis not present

## 2022-11-19 DIAGNOSIS — Z7901 Long term (current) use of anticoagulants: Secondary | ICD-10-CM | POA: Diagnosis not present

## 2022-11-19 DIAGNOSIS — Z556 Problems related to health literacy: Secondary | ICD-10-CM | POA: Diagnosis not present

## 2022-11-19 DIAGNOSIS — S80812D Abrasion, left lower leg, subsequent encounter: Secondary | ICD-10-CM | POA: Diagnosis not present

## 2022-11-19 DIAGNOSIS — M810 Age-related osteoporosis without current pathological fracture: Secondary | ICD-10-CM | POA: Diagnosis not present

## 2022-11-19 DIAGNOSIS — G8929 Other chronic pain: Secondary | ICD-10-CM | POA: Diagnosis not present

## 2022-11-19 DIAGNOSIS — M545 Low back pain, unspecified: Secondary | ICD-10-CM | POA: Diagnosis not present

## 2022-11-19 DIAGNOSIS — L089 Local infection of the skin and subcutaneous tissue, unspecified: Secondary | ICD-10-CM | POA: Diagnosis not present

## 2022-11-19 DIAGNOSIS — G629 Polyneuropathy, unspecified: Secondary | ICD-10-CM | POA: Diagnosis not present

## 2022-11-19 DIAGNOSIS — I480 Paroxysmal atrial fibrillation: Secondary | ICD-10-CM | POA: Diagnosis not present

## 2022-11-19 DIAGNOSIS — I2699 Other pulmonary embolism without acute cor pulmonale: Secondary | ICD-10-CM | POA: Diagnosis not present

## 2022-11-19 DIAGNOSIS — E538 Deficiency of other specified B group vitamins: Secondary | ICD-10-CM | POA: Diagnosis not present

## 2022-11-19 DIAGNOSIS — D509 Iron deficiency anemia, unspecified: Secondary | ICD-10-CM | POA: Diagnosis not present

## 2022-11-19 DIAGNOSIS — N184 Chronic kidney disease, stage 4 (severe): Secondary | ICD-10-CM | POA: Diagnosis not present

## 2022-11-19 DIAGNOSIS — I872 Venous insufficiency (chronic) (peripheral): Secondary | ICD-10-CM | POA: Diagnosis not present

## 2022-11-19 DIAGNOSIS — I5043 Acute on chronic combined systolic (congestive) and diastolic (congestive) heart failure: Secondary | ICD-10-CM | POA: Diagnosis not present

## 2022-11-19 DIAGNOSIS — I130 Hypertensive heart and chronic kidney disease with heart failure and stage 1 through stage 4 chronic kidney disease, or unspecified chronic kidney disease: Secondary | ICD-10-CM | POA: Diagnosis not present

## 2022-11-23 DIAGNOSIS — N184 Chronic kidney disease, stage 4 (severe): Secondary | ICD-10-CM | POA: Diagnosis not present

## 2022-11-23 DIAGNOSIS — I480 Paroxysmal atrial fibrillation: Secondary | ICD-10-CM | POA: Diagnosis not present

## 2022-11-23 DIAGNOSIS — S80812D Abrasion, left lower leg, subsequent encounter: Secondary | ICD-10-CM | POA: Diagnosis not present

## 2022-11-23 DIAGNOSIS — D631 Anemia in chronic kidney disease: Secondary | ICD-10-CM | POA: Diagnosis not present

## 2022-11-23 DIAGNOSIS — L089 Local infection of the skin and subcutaneous tissue, unspecified: Secondary | ICD-10-CM | POA: Diagnosis not present

## 2022-11-23 DIAGNOSIS — I130 Hypertensive heart and chronic kidney disease with heart failure and stage 1 through stage 4 chronic kidney disease, or unspecified chronic kidney disease: Secondary | ICD-10-CM | POA: Diagnosis not present

## 2022-11-23 DIAGNOSIS — D509 Iron deficiency anemia, unspecified: Secondary | ICD-10-CM | POA: Diagnosis not present

## 2022-11-23 DIAGNOSIS — Z556 Problems related to health literacy: Secondary | ICD-10-CM | POA: Diagnosis not present

## 2022-11-23 DIAGNOSIS — I2699 Other pulmonary embolism without acute cor pulmonale: Secondary | ICD-10-CM | POA: Diagnosis not present

## 2022-11-23 DIAGNOSIS — M810 Age-related osteoporosis without current pathological fracture: Secondary | ICD-10-CM | POA: Diagnosis not present

## 2022-11-23 DIAGNOSIS — G629 Polyneuropathy, unspecified: Secondary | ICD-10-CM | POA: Diagnosis not present

## 2022-11-23 DIAGNOSIS — I872 Venous insufficiency (chronic) (peripheral): Secondary | ICD-10-CM | POA: Diagnosis not present

## 2022-11-23 DIAGNOSIS — M545 Low back pain, unspecified: Secondary | ICD-10-CM | POA: Diagnosis not present

## 2022-11-23 DIAGNOSIS — E538 Deficiency of other specified B group vitamins: Secondary | ICD-10-CM | POA: Diagnosis not present

## 2022-11-23 DIAGNOSIS — I5043 Acute on chronic combined systolic (congestive) and diastolic (congestive) heart failure: Secondary | ICD-10-CM | POA: Diagnosis not present

## 2022-11-23 DIAGNOSIS — Z7901 Long term (current) use of anticoagulants: Secondary | ICD-10-CM | POA: Diagnosis not present

## 2022-11-23 DIAGNOSIS — G8929 Other chronic pain: Secondary | ICD-10-CM | POA: Diagnosis not present

## 2022-11-24 DIAGNOSIS — S81802A Unspecified open wound, left lower leg, initial encounter: Secondary | ICD-10-CM | POA: Diagnosis not present

## 2022-11-26 DIAGNOSIS — D631 Anemia in chronic kidney disease: Secondary | ICD-10-CM | POA: Diagnosis not present

## 2022-11-26 DIAGNOSIS — E538 Deficiency of other specified B group vitamins: Secondary | ICD-10-CM | POA: Diagnosis not present

## 2022-11-26 DIAGNOSIS — D509 Iron deficiency anemia, unspecified: Secondary | ICD-10-CM | POA: Diagnosis not present

## 2022-11-26 DIAGNOSIS — I872 Venous insufficiency (chronic) (peripheral): Secondary | ICD-10-CM | POA: Diagnosis not present

## 2022-11-26 DIAGNOSIS — M545 Low back pain, unspecified: Secondary | ICD-10-CM | POA: Diagnosis not present

## 2022-11-26 DIAGNOSIS — I2699 Other pulmonary embolism without acute cor pulmonale: Secondary | ICD-10-CM | POA: Diagnosis not present

## 2022-11-26 DIAGNOSIS — I130 Hypertensive heart and chronic kidney disease with heart failure and stage 1 through stage 4 chronic kidney disease, or unspecified chronic kidney disease: Secondary | ICD-10-CM | POA: Diagnosis not present

## 2022-11-26 DIAGNOSIS — Z556 Problems related to health literacy: Secondary | ICD-10-CM | POA: Diagnosis not present

## 2022-11-26 DIAGNOSIS — S80812D Abrasion, left lower leg, subsequent encounter: Secondary | ICD-10-CM | POA: Diagnosis not present

## 2022-11-26 DIAGNOSIS — I5043 Acute on chronic combined systolic (congestive) and diastolic (congestive) heart failure: Secondary | ICD-10-CM | POA: Diagnosis not present

## 2022-11-26 DIAGNOSIS — Z7901 Long term (current) use of anticoagulants: Secondary | ICD-10-CM | POA: Diagnosis not present

## 2022-11-26 DIAGNOSIS — G8929 Other chronic pain: Secondary | ICD-10-CM | POA: Diagnosis not present

## 2022-11-26 DIAGNOSIS — M810 Age-related osteoporosis without current pathological fracture: Secondary | ICD-10-CM | POA: Diagnosis not present

## 2022-11-26 DIAGNOSIS — I480 Paroxysmal atrial fibrillation: Secondary | ICD-10-CM | POA: Diagnosis not present

## 2022-11-26 DIAGNOSIS — G629 Polyneuropathy, unspecified: Secondary | ICD-10-CM | POA: Diagnosis not present

## 2022-11-26 DIAGNOSIS — L089 Local infection of the skin and subcutaneous tissue, unspecified: Secondary | ICD-10-CM | POA: Diagnosis not present

## 2022-11-26 DIAGNOSIS — N184 Chronic kidney disease, stage 4 (severe): Secondary | ICD-10-CM | POA: Diagnosis not present

## 2022-11-30 DIAGNOSIS — D509 Iron deficiency anemia, unspecified: Secondary | ICD-10-CM | POA: Diagnosis not present

## 2022-11-30 DIAGNOSIS — Z556 Problems related to health literacy: Secondary | ICD-10-CM | POA: Diagnosis not present

## 2022-11-30 DIAGNOSIS — N184 Chronic kidney disease, stage 4 (severe): Secondary | ICD-10-CM | POA: Diagnosis not present

## 2022-11-30 DIAGNOSIS — I2699 Other pulmonary embolism without acute cor pulmonale: Secondary | ICD-10-CM | POA: Diagnosis not present

## 2022-11-30 DIAGNOSIS — G629 Polyneuropathy, unspecified: Secondary | ICD-10-CM | POA: Diagnosis not present

## 2022-11-30 DIAGNOSIS — D631 Anemia in chronic kidney disease: Secondary | ICD-10-CM | POA: Diagnosis not present

## 2022-11-30 DIAGNOSIS — I872 Venous insufficiency (chronic) (peripheral): Secondary | ICD-10-CM | POA: Diagnosis not present

## 2022-11-30 DIAGNOSIS — S80812D Abrasion, left lower leg, subsequent encounter: Secondary | ICD-10-CM | POA: Diagnosis not present

## 2022-11-30 DIAGNOSIS — M810 Age-related osteoporosis without current pathological fracture: Secondary | ICD-10-CM | POA: Diagnosis not present

## 2022-11-30 DIAGNOSIS — L089 Local infection of the skin and subcutaneous tissue, unspecified: Secondary | ICD-10-CM | POA: Diagnosis not present

## 2022-11-30 DIAGNOSIS — I480 Paroxysmal atrial fibrillation: Secondary | ICD-10-CM | POA: Diagnosis not present

## 2022-11-30 DIAGNOSIS — Z7901 Long term (current) use of anticoagulants: Secondary | ICD-10-CM | POA: Diagnosis not present

## 2022-11-30 DIAGNOSIS — M545 Low back pain, unspecified: Secondary | ICD-10-CM | POA: Diagnosis not present

## 2022-11-30 DIAGNOSIS — I130 Hypertensive heart and chronic kidney disease with heart failure and stage 1 through stage 4 chronic kidney disease, or unspecified chronic kidney disease: Secondary | ICD-10-CM | POA: Diagnosis not present

## 2022-11-30 DIAGNOSIS — G8929 Other chronic pain: Secondary | ICD-10-CM | POA: Diagnosis not present

## 2022-11-30 DIAGNOSIS — E538 Deficiency of other specified B group vitamins: Secondary | ICD-10-CM | POA: Diagnosis not present

## 2022-11-30 DIAGNOSIS — I5043 Acute on chronic combined systolic (congestive) and diastolic (congestive) heart failure: Secondary | ICD-10-CM | POA: Diagnosis not present

## 2022-12-03 DIAGNOSIS — D631 Anemia in chronic kidney disease: Secondary | ICD-10-CM | POA: Diagnosis not present

## 2022-12-03 DIAGNOSIS — I480 Paroxysmal atrial fibrillation: Secondary | ICD-10-CM | POA: Diagnosis not present

## 2022-12-03 DIAGNOSIS — I2699 Other pulmonary embolism without acute cor pulmonale: Secondary | ICD-10-CM | POA: Diagnosis not present

## 2022-12-03 DIAGNOSIS — D509 Iron deficiency anemia, unspecified: Secondary | ICD-10-CM | POA: Diagnosis not present

## 2022-12-03 DIAGNOSIS — I5043 Acute on chronic combined systolic (congestive) and diastolic (congestive) heart failure: Secondary | ICD-10-CM | POA: Diagnosis not present

## 2022-12-03 DIAGNOSIS — I130 Hypertensive heart and chronic kidney disease with heart failure and stage 1 through stage 4 chronic kidney disease, or unspecified chronic kidney disease: Secondary | ICD-10-CM | POA: Diagnosis not present

## 2022-12-03 DIAGNOSIS — Z556 Problems related to health literacy: Secondary | ICD-10-CM | POA: Diagnosis not present

## 2022-12-03 DIAGNOSIS — M810 Age-related osteoporosis without current pathological fracture: Secondary | ICD-10-CM | POA: Diagnosis not present

## 2022-12-03 DIAGNOSIS — L089 Local infection of the skin and subcutaneous tissue, unspecified: Secondary | ICD-10-CM | POA: Diagnosis not present

## 2022-12-03 DIAGNOSIS — G629 Polyneuropathy, unspecified: Secondary | ICD-10-CM | POA: Diagnosis not present

## 2022-12-03 DIAGNOSIS — E538 Deficiency of other specified B group vitamins: Secondary | ICD-10-CM | POA: Diagnosis not present

## 2022-12-03 DIAGNOSIS — N184 Chronic kidney disease, stage 4 (severe): Secondary | ICD-10-CM | POA: Diagnosis not present

## 2022-12-03 DIAGNOSIS — M545 Low back pain, unspecified: Secondary | ICD-10-CM | POA: Diagnosis not present

## 2022-12-03 DIAGNOSIS — S80812D Abrasion, left lower leg, subsequent encounter: Secondary | ICD-10-CM | POA: Diagnosis not present

## 2022-12-03 DIAGNOSIS — I872 Venous insufficiency (chronic) (peripheral): Secondary | ICD-10-CM | POA: Diagnosis not present

## 2022-12-03 DIAGNOSIS — G8929 Other chronic pain: Secondary | ICD-10-CM | POA: Diagnosis not present

## 2022-12-03 DIAGNOSIS — Z7901 Long term (current) use of anticoagulants: Secondary | ICD-10-CM | POA: Diagnosis not present

## 2022-12-07 DIAGNOSIS — G8929 Other chronic pain: Secondary | ICD-10-CM | POA: Diagnosis not present

## 2022-12-07 DIAGNOSIS — M810 Age-related osteoporosis without current pathological fracture: Secondary | ICD-10-CM | POA: Diagnosis not present

## 2022-12-07 DIAGNOSIS — Z556 Problems related to health literacy: Secondary | ICD-10-CM | POA: Diagnosis not present

## 2022-12-07 DIAGNOSIS — N184 Chronic kidney disease, stage 4 (severe): Secondary | ICD-10-CM | POA: Diagnosis not present

## 2022-12-07 DIAGNOSIS — I2699 Other pulmonary embolism without acute cor pulmonale: Secondary | ICD-10-CM | POA: Diagnosis not present

## 2022-12-07 DIAGNOSIS — E538 Deficiency of other specified B group vitamins: Secondary | ICD-10-CM | POA: Diagnosis not present

## 2022-12-07 DIAGNOSIS — I5043 Acute on chronic combined systolic (congestive) and diastolic (congestive) heart failure: Secondary | ICD-10-CM | POA: Diagnosis not present

## 2022-12-07 DIAGNOSIS — D631 Anemia in chronic kidney disease: Secondary | ICD-10-CM | POA: Diagnosis not present

## 2022-12-07 DIAGNOSIS — L089 Local infection of the skin and subcutaneous tissue, unspecified: Secondary | ICD-10-CM | POA: Diagnosis not present

## 2022-12-07 DIAGNOSIS — I130 Hypertensive heart and chronic kidney disease with heart failure and stage 1 through stage 4 chronic kidney disease, or unspecified chronic kidney disease: Secondary | ICD-10-CM | POA: Diagnosis not present

## 2022-12-07 DIAGNOSIS — I872 Venous insufficiency (chronic) (peripheral): Secondary | ICD-10-CM | POA: Diagnosis not present

## 2022-12-07 DIAGNOSIS — D509 Iron deficiency anemia, unspecified: Secondary | ICD-10-CM | POA: Diagnosis not present

## 2022-12-07 DIAGNOSIS — M545 Low back pain, unspecified: Secondary | ICD-10-CM | POA: Diagnosis not present

## 2022-12-07 DIAGNOSIS — I480 Paroxysmal atrial fibrillation: Secondary | ICD-10-CM | POA: Diagnosis not present

## 2022-12-07 DIAGNOSIS — Z7901 Long term (current) use of anticoagulants: Secondary | ICD-10-CM | POA: Diagnosis not present

## 2022-12-07 DIAGNOSIS — G629 Polyneuropathy, unspecified: Secondary | ICD-10-CM | POA: Diagnosis not present

## 2022-12-07 DIAGNOSIS — S80812D Abrasion, left lower leg, subsequent encounter: Secondary | ICD-10-CM | POA: Diagnosis not present

## 2022-12-10 DIAGNOSIS — E538 Deficiency of other specified B group vitamins: Secondary | ICD-10-CM | POA: Diagnosis not present

## 2022-12-10 DIAGNOSIS — I2699 Other pulmonary embolism without acute cor pulmonale: Secondary | ICD-10-CM | POA: Diagnosis not present

## 2022-12-10 DIAGNOSIS — M545 Low back pain, unspecified: Secondary | ICD-10-CM | POA: Diagnosis not present

## 2022-12-10 DIAGNOSIS — I5043 Acute on chronic combined systolic (congestive) and diastolic (congestive) heart failure: Secondary | ICD-10-CM | POA: Diagnosis not present

## 2022-12-10 DIAGNOSIS — I480 Paroxysmal atrial fibrillation: Secondary | ICD-10-CM | POA: Diagnosis not present

## 2022-12-10 DIAGNOSIS — L089 Local infection of the skin and subcutaneous tissue, unspecified: Secondary | ICD-10-CM | POA: Diagnosis not present

## 2022-12-10 DIAGNOSIS — D509 Iron deficiency anemia, unspecified: Secondary | ICD-10-CM | POA: Diagnosis not present

## 2022-12-10 DIAGNOSIS — M810 Age-related osteoporosis without current pathological fracture: Secondary | ICD-10-CM | POA: Diagnosis not present

## 2022-12-10 DIAGNOSIS — I130 Hypertensive heart and chronic kidney disease with heart failure and stage 1 through stage 4 chronic kidney disease, or unspecified chronic kidney disease: Secondary | ICD-10-CM | POA: Diagnosis not present

## 2022-12-10 DIAGNOSIS — D631 Anemia in chronic kidney disease: Secondary | ICD-10-CM | POA: Diagnosis not present

## 2022-12-10 DIAGNOSIS — Z556 Problems related to health literacy: Secondary | ICD-10-CM | POA: Diagnosis not present

## 2022-12-10 DIAGNOSIS — G8929 Other chronic pain: Secondary | ICD-10-CM | POA: Diagnosis not present

## 2022-12-10 DIAGNOSIS — S80812D Abrasion, left lower leg, subsequent encounter: Secondary | ICD-10-CM | POA: Diagnosis not present

## 2022-12-10 DIAGNOSIS — I872 Venous insufficiency (chronic) (peripheral): Secondary | ICD-10-CM | POA: Diagnosis not present

## 2022-12-10 DIAGNOSIS — Z7901 Long term (current) use of anticoagulants: Secondary | ICD-10-CM | POA: Diagnosis not present

## 2022-12-10 DIAGNOSIS — G629 Polyneuropathy, unspecified: Secondary | ICD-10-CM | POA: Diagnosis not present

## 2022-12-10 DIAGNOSIS — N184 Chronic kidney disease, stage 4 (severe): Secondary | ICD-10-CM | POA: Diagnosis not present

## 2022-12-17 ENCOUNTER — Ambulatory Visit: Payer: Medicare Other | Admitting: Physician Assistant

## 2022-12-20 DIAGNOSIS — N184 Chronic kidney disease, stage 4 (severe): Secondary | ICD-10-CM | POA: Diagnosis not present

## 2022-12-20 DIAGNOSIS — I480 Paroxysmal atrial fibrillation: Secondary | ICD-10-CM | POA: Diagnosis not present

## 2022-12-20 DIAGNOSIS — I5042 Chronic combined systolic (congestive) and diastolic (congestive) heart failure: Secondary | ICD-10-CM | POA: Diagnosis not present

## 2022-12-20 DIAGNOSIS — I129 Hypertensive chronic kidney disease with stage 1 through stage 4 chronic kidney disease, or unspecified chronic kidney disease: Secondary | ICD-10-CM | POA: Diagnosis not present

## 2022-12-21 LAB — LAB REPORT - SCANNED: EGFR: 27

## 2023-01-03 DIAGNOSIS — H04123 Dry eye syndrome of bilateral lacrimal glands: Secondary | ICD-10-CM | POA: Diagnosis not present

## 2023-01-03 DIAGNOSIS — H0100B Unspecified blepharitis left eye, upper and lower eyelids: Secondary | ICD-10-CM | POA: Diagnosis not present

## 2023-01-03 DIAGNOSIS — H52203 Unspecified astigmatism, bilateral: Secondary | ICD-10-CM | POA: Diagnosis not present

## 2023-01-03 DIAGNOSIS — H524 Presbyopia: Secondary | ICD-10-CM | POA: Diagnosis not present

## 2023-01-03 DIAGNOSIS — H31012 Macula scars of posterior pole (postinflammatory) (post-traumatic), left eye: Secondary | ICD-10-CM | POA: Diagnosis not present

## 2023-01-03 DIAGNOSIS — H0100A Unspecified blepharitis right eye, upper and lower eyelids: Secondary | ICD-10-CM | POA: Diagnosis not present

## 2023-01-04 DIAGNOSIS — Z85828 Personal history of other malignant neoplasm of skin: Secondary | ICD-10-CM | POA: Diagnosis not present

## 2023-01-04 DIAGNOSIS — Z08 Encounter for follow-up examination after completed treatment for malignant neoplasm: Secondary | ICD-10-CM | POA: Diagnosis not present

## 2023-01-04 DIAGNOSIS — L304 Erythema intertrigo: Secondary | ICD-10-CM | POA: Diagnosis not present

## 2023-01-08 NOTE — Progress Notes (Unsigned)
Cardiology Office Note Date:  01/08/2023  Patient ID:  ARICELA TORREALBA, DOB July 22, 1932, MRN [id number] PCP:  Donato Schultz, DO  Electrophysiologist: Dr. Elberta Fortis Nephrology: Dr. Azzie Almas    Chief Complaint:   *** 6 mo  History of Present Illness: EUGENIA RARDON is a 89 y.o. female with history of HTN, HLD, PE, AFib, chronic CHF (systolic), CKD (IV)  She was hospitalized 07/22/21 with progressive weakness, to the point of unable to stand independently, dry cough a couple dys, LE swelling, found anemic (8.9hgb) and hypoxic w/CXR noting CHF and perhaps a pneumonia, she was in AFib rates 50s. Our cardiology team consulted. LVEF 50-55%, mild LVH, mild-mod MR Markedly volume OL with diuresis Creat on the rise  to the 3's Her OAC held on admission 2/2 her anemia, suggested perhaps palliative with rising creat, no wishes for HD, advanced age and debility Neohrology also aided her stay GI, Hold off on any endoscopic procedures d/t advanced age and multiple comorbidities.  Besides, she did refuse as well. Can always reconsider if she has any active bleeding", Eliquis resumed Discharged 08/01/21, with Creat 2.66 near her baseline  ------  She saw Dr. Elberta Fortis in July 2023, had suffered a compression fracture, not felt a surgical candidate by neurosurgery, in a lot of pain.  Cardiac-wise stable, no changes were made.  I saw her Feb 2024 She is doing fairly well. She has c/w pretty significant back pain, gets relief by sitting with good back support. She denies SOB, but has an occasional chest awareness, ?not quite pain, perhaps fast beats, just comes and goes. Sometimes she hold her chest/puts her hand on her chest and it seems to help. No near syncope or syncope. No bleeding or signs of bleeding She sees Dr. Malen Gauze in April Labs deferred to that visit Rate controlled Chronic asymmetric edema, not felt to be overtly volume OL No changes were made  *** rates *** burden,  symptoms *** eliquis, bleeding, labs   June she had some trouble with LLE wound with poor healing   AFib/AAD hx Diagnosed 11/2019 in the environment of a PE persistent  Past Medical History:  Diagnosis Date   Anemia    hx of    Angiodysplasia 2007   @ colonoscopy   Anxiety    PMH of   Chronic low back pain 06/21/2014   Diverticulosis of colon (without mention of hemorrhage)    DJD (degenerative joint disease)    Esophageal reflux    inactive   History of vertebral fracture 11/2015   HTN (hypertension)    Hyperlipemia 2006   LDL 130   Hypoglycemia    reactive   Pelvic fracture (HCC) 12/30/2011   [facility]   Peripheral neuropathy    Personal history of colonic polyps    adenomatous   Vitamin B12 deficiency     Past Surgical History:  Procedure Laterality Date   cataract surgery  12-26-12 and 01-09-13   COLONOSCOPY  2011   neg   COLONOSCOPY W/ POLYPECTOMY     X 2 , Dr  Sheryn Bison; angiodysplasia. Due 2022   DILATION AND CURETTAGE OF UTERUS     FACIAL COSMETIC SURGERY     HEMORROIDECTOMY     LUMBAR LAMINECTOMY/DECOMPRESSION MICRODISCECTOMY  09/10/2011   Procedure: LUMBAR LAMINECTOMY/DECOMPRESSION MICRODISCECTOMY;  Surgeon: Javier Docker, MD;  Location: [facility];  Service: Orthopedics;  Laterality: N/A;  decompression laminectomy L2-3, L3-4, L4-5   ORIF WRIST FRACTURE  01/11/2012  Procedure: OPEN REDUCTION INTERNAL FIXATION (ORIF) WRIST FRACTURE;  Surgeon: Tami Ribas, MD;  Location: [facility];  Service: Orthopedics;  Laterality: Right;   TEAR DUCT PROBING     X 2   TOTAL HIP ARTHROPLASTY  2000   right   TUBAL LIGATION      Current Outpatient Medications  Medication Sig Dispense Refill   acetaminophen (TYLENOL) 325 MG tablet Take 2 tablets (650 mg total) by mouth every 6 (six) hours as needed for mild pain (or Fever >/= 101).     amoxicillin-clavulanate (AUGMENTIN) 500-125 MG tablet 1 tab po bid x 10 days (Patient not taking: Reported on  09/28/2022) 14 tablet 0   apixaban (ELIQUIS) 2.5 MG TABS tablet TAKE 1 TABLET(2.5 MG) BY MOUTH TWICE DAILY 180 tablet 1   Ascorbic Acid (VITAMIN C) 1000 MG tablet Take 1,000 mg by mouth daily.     Calcium Carbonate (CALCIUM 600 PO) Take 600 mg by mouth daily.     Cholecalciferol (VITAMIN D) 2000 UNITS CAPS Take 2,000 Units by mouth daily.     ferrous gluconate (FERGON) 324 MG tablet Take 1 tablet (324 mg total) by mouth daily. 90 tablet 3   furosemide (LASIX) 40 MG tablet Take 1 tablet (40 mg total) by mouth daily. 90 tablet 3   hydrOXYzine (VISTARIL) 25 MG capsule Take 1 capsule (25 mg total) by mouth every 8 (eight) hours as needed for anxiety. 30 capsule 2   mupirocin ointment (BACTROBAN) 2 % Apply 1 Application topically 2 (two) times daily. 22 g 0   NONFORMULARY OR COMPOUNDED ITEM Compression socks  20-30 mm/hg #1  as directed 1 each 2   pantoprazole (PROTONIX) 40 MG tablet Take 1 tablet (40 mg total) by mouth daily.     Polyethyl Glycol-Propyl Glycol (SYSTANE ULTRA OP) Place 1 drop into both eyes daily as needed (for dry eyes).     polyethylene glycol (MIRALAX / GLYCOLAX) 17 g packet Take 17 g by mouth 2 (two) times daily. 14 each 0   pregabalin (LYRICA) 50 MG capsule Take 1 capsule (50 mg total) by mouth at bedtime. 90 capsule 1   sulfamethoxazole-trimethoprim (BACTRIM DS) 800-160 MG tablet Take 1 tablet by mouth 2 (two) times daily. 14 tablet 0   thiamine 100 MG tablet Take 1 tablet (100 mg total) by mouth daily.     vitamin B-12 (CYANOCOBALAMIN) 1000 MCG tablet Take 1 tablet (1,000 mcg total) by mouth daily.     No current facility-administered medications for this visit.    Allergies:   Codeine, Duloxetine, and Sertraline hcl   Social History:  The patient  reports that she has never smoked. She has never used smokeless tobacco. She reports current alcohol use of about 3.0 standard drinks of alcohol per week. She reports that she does not use drugs.   Family History:  The patient's  family history includes Cancer in her brother; Depression in her maternal uncle; Heart attack (age of onset: 52) in her brother; Heart attack (age of onset: 26) in her father; Kidney disease in her mother; Osteoporosis in her sister; Peripheral vascular disease in her daughter; Stroke (age of onset: 46) in her brother; Throat cancer in her sister.  ROS:  Please see the history of present illness.    All other systems are reviewed and otherwise negative.   PHYSICAL EXAM:  VS:  There were no vitals taken for this visit. BMI: There is no height or weight on file to calculate BMI.  Well nourished, well developed, in no acute distress HEENT: normocephalic, atraumatic Neck: no JVD, carotid bruits or masses Cardiac: ***  irreg-irreg; no significant murmurs, no rubs, or gallops Lungs: *** CTA b/l, no wheezing, rhonchi or rales Abd: soft, nontender MS: no deformity or atrophy Ext: *** chronic skin changes shins/lower legs, she has 1-2+ edema LLE to midshin, less on the right edema Skin: warm and dry, no rash Neuro:  No gross deficits appreciated Psych: euthymic mood, full affect    EKG:  not done today  07/23/2021: TTE  1. Left ventricular ejection fraction, by estimation, is 50 to 55%. The  left ventricle has low normal function. The left ventricle has no regional  wall motion abnormalities. There is mild concentric left ventricular  hypertrophy. Left ventricular  diastolic parameters are indeterminate.   2. Right ventricular systolic function is normal. The right ventricular  size is normal. There is normal pulmonary artery systolic pressure. The  estimated right ventricular systolic pressure is 23.6 mmHg.   3. Left atrial size was severely dilated.   4. The mitral valve is normal in structure. Mild to moderate mitral valve  regurgitation. No evidence of mitral stenosis.   5. The aortic valve is tricuspid. There is mild thickening of the aortic  valve. Aortic valve regurgitation is mild to  moderate.   6. Aortic dilatation noted. There is mild dilatation of the ascending  aorta, measuring 43 mm.   7. The inferior vena cava is normal in size with greater than 50%  respiratory variability, suggesting right atrial pressure of 3 mmHg.   Comparison(s): Aortic Dilation increased from prior.    TTE 11/09/19  Review of the above records today demonstrates:   1. Mild global reduction in LV systolic function; grade 1 diastolic  dysfunction; right heart not well visualized.   2. Left ventricular ejection fraction, by estimation, is 45 to 50%. The  left ventricle has mildly decreased function. The left ventricle  demonstrates global hypokinesis. Left ventricular diastolic parameters are  consistent with Grade I diastolic  dysfunction (impaired relaxation). Elevated left atrial pressure.   3. Right ventricular systolic function is normal. The right ventricular  size is normal. Tricuspid regurgitation signal is inadequate for assessing  PA pressure.   4. Left atrial size was moderately dilated.   5. The mitral valve is normal in structure. Mild mitral valve  regurgitation. No evidence of mitral stenosis.   6. The aortic valve is tricuspid. Aortic valve regurgitation is mild. No  aortic stenosis is present.   7. Aortic dilatation noted. There is mild dilatation of the aortic root  measuring 39 mm.   8. The inferior vena cava is normal in size with greater than 50%  respiratory variability, suggesting right atrial pressure of 3 mmHg.    Cardiac monitor 02/15/2020 personally reviewed Max 174 bpm 06:30am, 10/25 Min 36 bpm 11:57pm, 11/03 Avg 61 bpm <1% ventricular ectopy 12.3% supraventricular ectopy Predominant underlying rhythm was sinus rhythm 5% atrial fibrillation burden 28 beat run of NSVT No symptoms recorded  Recent Labs: 09/16/2022: ALT 9; BUN 30; Creatinine, Ser 1.96; Potassium 4.6; Sodium 136 09/22/2022: Hemoglobin 11.9; Platelets 277.0  08/19/2022: Cholesterol 160; HDL  56.60; LDL Cholesterol 88; Total CHOL/HDL Ratio 3; Triglycerides 74.0; VLDL 14.8   CrCl cannot be calculated (Patient's most recent lab result is older than the maximum 21 days allowed.).   Wt Readings from Last 3 Encounters:  09/22/22 135 lb (61.2 kg)  08/19/22 139 lb 12.8 oz (63.4 kg)  06/02/22 144 lb 4.8 oz (65.5 kg)     Other studies reviewed: Additional studies/records reviewed today include: summarized above  ASSESSMENT AND PLAN:  Persistent AFib CHA2DS2Vasc is 7, on Eliquis, *** appropriately dosed for  age/renal function *** Rate controlled  Chronic CHF (combined) *** Chronic asymmetrical edema *** No SOB *** Lungs are clear.  HTN *** No changes  4. Secondary hypercoagulable state 2/2 AFib    Disposition:  *** we will have her back in 6 mo, sooner if needed  Current medicines are reviewed at length with the patient today.  The patient did not have any concerns regarding medicines.  Norma Fredrickson, PA-C 01/08/2023 11:11 AM     [facility] [address] [place] [address] [phone] (office)  [phone] (fax)

## 2023-01-10 ENCOUNTER — Encounter: Payer: Self-pay | Admitting: Physician Assistant

## 2023-01-10 ENCOUNTER — Ambulatory Visit: Payer: Medicare Other | Attending: Physician Assistant | Admitting: Physician Assistant

## 2023-01-10 VITALS — BP 114/62 | HR 82 | Ht 65.0 in | Wt 132.0 lb

## 2023-01-10 DIAGNOSIS — I4819 Other persistent atrial fibrillation: Secondary | ICD-10-CM

## 2023-01-10 DIAGNOSIS — D6869 Other thrombophilia: Secondary | ICD-10-CM

## 2023-01-10 DIAGNOSIS — I10 Essential (primary) hypertension: Secondary | ICD-10-CM

## 2023-01-10 DIAGNOSIS — I5042 Chronic combined systolic (congestive) and diastolic (congestive) heart failure: Secondary | ICD-10-CM | POA: Diagnosis not present

## 2023-01-10 NOTE — Patient Instructions (Signed)
Medication Instructions:   Your physician recommends that you continue on your current medications as directed. Please refer to the Current Medication list given to you today.  *If you need a refill on your cardiac medications before your next appointment, please call your pharmacy*   Lab Work: NONE ORDERED  TODAY   If you have labs (blood work) drawn today and your tests are completely normal, you will receive your results only by: MyChart Message (if you have MyChart) OR A paper copy in the mail If you have any lab test that is abnormal or we need to change your treatment, we will call you to review the results.   Testing/Procedures: NONE ORDERED  TODAY   Follow-Up: At [facility], you and your health needs are our priority.  As part of our continuing mission to provide you with exceptional heart care, we have created designated Provider Care Teams.  These Care Teams include your primary Cardiologist (physician) and Advanced Practice Providers (APPs -  Physician Assistants and Nurse Practitioners) who all work together to provide you with the care you need, when you need it.  We recommend signing up for the patient portal called "MyChart".  Sign up information is provided on this After Visit Summary.  MyChart is used to connect with patients for Virtual Visits (Telemedicine).  Patients are able to view lab/test results, encounter notes, upcoming appointments, etc.  Non-urgent messages can be sent to your provider as well.   To learn more about what you can do with MyChart, go to [email or web address].    Your next appointment: ONLY   6 month(s)  Provider:   Loman Brooklyn, MD    Other Instructions

## 2023-02-04 ENCOUNTER — Emergency Department (HOSPITAL_COMMUNITY): Payer: Medicare Other

## 2023-02-04 ENCOUNTER — Emergency Department (HOSPITAL_COMMUNITY)
Admission: EM | Admit: 2023-02-04 | Discharge: 2023-02-04 | Disposition: A | Discharge status: Home / Self Care | Payer: Medicare Other | Attending: Emergency Medicine | Admitting: Emergency Medicine

## 2023-02-04 ENCOUNTER — Encounter (HOSPITAL_COMMUNITY): Payer: Self-pay | Admitting: Emergency Medicine

## 2023-02-04 ENCOUNTER — Other Ambulatory Visit: Payer: Self-pay

## 2023-02-04 DIAGNOSIS — I110 Hypertensive heart disease with heart failure: Secondary | ICD-10-CM | POA: Diagnosis not present

## 2023-02-04 DIAGNOSIS — R0602 Shortness of breath: Secondary | ICD-10-CM | POA: Insufficient documentation

## 2023-02-04 DIAGNOSIS — I700 Atherosclerosis of aorta: Secondary | ICD-10-CM | POA: Diagnosis not present

## 2023-02-04 DIAGNOSIS — Z1152 Encounter for screening for COVID-19: Secondary | ICD-10-CM | POA: Diagnosis not present

## 2023-02-04 DIAGNOSIS — I517 Cardiomegaly: Secondary | ICD-10-CM | POA: Diagnosis not present

## 2023-02-04 DIAGNOSIS — Z79899 Other long term (current) drug therapy: Secondary | ICD-10-CM | POA: Diagnosis not present

## 2023-02-04 DIAGNOSIS — I5043 Acute on chronic combined systolic (congestive) and diastolic (congestive) heart failure: Secondary | ICD-10-CM | POA: Diagnosis not present

## 2023-02-04 DIAGNOSIS — R0989 Other specified symptoms and signs involving the circulatory and respiratory systems: Secondary | ICD-10-CM | POA: Diagnosis not present

## 2023-02-04 DIAGNOSIS — I509 Heart failure, unspecified: Secondary | ICD-10-CM | POA: Diagnosis not present

## 2023-02-04 DIAGNOSIS — J810 Acute pulmonary edema: Secondary | ICD-10-CM | POA: Diagnosis not present

## 2023-02-04 DIAGNOSIS — Z7901 Long term (current) use of anticoagulants: Secondary | ICD-10-CM | POA: Insufficient documentation

## 2023-02-04 DIAGNOSIS — Z743 Need for continuous supervision: Secondary | ICD-10-CM | POA: Diagnosis not present

## 2023-02-04 DIAGNOSIS — R918 Other nonspecific abnormal finding of lung field: Secondary | ICD-10-CM | POA: Diagnosis not present

## 2023-02-04 DIAGNOSIS — R079 Chest pain, unspecified: Secondary | ICD-10-CM | POA: Diagnosis not present

## 2023-02-04 DIAGNOSIS — R059 Cough, unspecified: Secondary | ICD-10-CM | POA: Diagnosis not present

## 2023-02-04 LAB — BASIC METABOLIC PANEL
Anion gap: 9 (ref 5–15)
BUN: 31 mg/dL — ABNORMAL HIGH (ref 8–23)
CO2: 22 mmol/L (ref 22–32)
Calcium: 9.2 mg/dL (ref 8.9–10.3)
Chloride: 103 mmol/L (ref 98–111)
Creatinine, Ser: 1.62 mg/dL — ABNORMAL HIGH (ref 0.44–1.00)
GFR, Estimated: 30 mL/min — ABNORMAL LOW (ref >60)
Glucose, Bld: 96 mg/dL (ref 70–99)
Potassium: 3.9 mmol/L (ref 3.5–5.1)
Sodium: 134 mmol/L — ABNORMAL LOW (ref 135–145)

## 2023-02-04 LAB — CBC
HCT: 34.8 % — ABNORMAL LOW (ref 36.0–46.0)
Hemoglobin: 11.3 g/dL — ABNORMAL LOW (ref 12.0–15.0)
MCH: 30.4 pg (ref 26.0–34.0)
MCHC: 32.5 g/dL (ref 30.0–36.0)
MCV: 93.5 fL (ref 80.0–100.0)
Platelets: 213 10*3/uL (ref 150–400)
RBC: 3.72 MIL/uL — ABNORMAL LOW (ref 3.87–5.11)
RDW: 15.5 % (ref 11.5–15.5)
WBC: 4 10*3/uL (ref 4.0–10.5)
nRBC: 0 % (ref 0.0–0.2)

## 2023-02-04 LAB — BRAIN NATRIURETIC PEPTIDE: B Natriuretic Peptide: 514.8 pg/mL — ABNORMAL HIGH (ref 0.0–100.0)

## 2023-02-04 LAB — RESP PANEL BY RT-PCR (RSV, FLU A&B, COVID)  RVPGX2
Influenza A by PCR: NEGATIVE
Influenza B by PCR: NEGATIVE
Resp Syncytial Virus by PCR: NEGATIVE
SARS Coronavirus 2 by RT PCR: NEGATIVE

## 2023-02-04 LAB — TROPONIN I (HIGH SENSITIVITY)
Troponin I (High Sensitivity): 12 ng/L (ref <18)
Troponin I (High Sensitivity): 13 ng/L (ref <18)

## 2023-02-04 MED ORDER — FUROSEMIDE 10 MG/ML IJ SOLN
40.0000 mg | Freq: Once | INTRAMUSCULAR | Status: AC
  Administered 2023-02-04: 40 mg via INTRAVENOUS
  Filled 2023-02-04: qty 4

## 2023-02-04 NOTE — ED Triage Notes (Signed)
Per [facility] pt coming form home c/o cough, congestion and chest tightness.

## 2023-02-04 NOTE — ED Provider Notes (Signed)
[place] EMERGENCY DEPARTMENT AT [facility] Provider Note   CSN: [id number] Arrival date & time: 02/04/23  1504     History  Chief Complaint  Patient presents with   Cough   Chest Pain    Krystal Henderson is a 89 y.o. female.  Patient is an 89 year old female with a past medical history of CHF, A-fib on Eliquis, hypertension presenting to the emergency department with cough and chest tightness.  Patient is here with her husband who states that she has been complaining of chest tightness and heart palpitations for the last 2 weeks.  Patient states that she started coughing last night and has mild shortness of breath.  She states that she has had lower extremity swelling that is being followed by her nephrologist.  She states that she currently takes Lasix every Monday Wednesday Friday.  She denies any fevers, nausea or vomiting.  The history is provided by the patient.  Cough Associated symptoms: chest pain   Chest Pain Associated symptoms: cough        Home Medications Prior to Admission medications   Medication Sig Start Date End Date Taking? Authorizing Provider  acetaminophen (TYLENOL) 325 MG tablet Take 2 tablets (650 mg total) by mouth every 6 (six) hours as needed for mild pain (or Fever >/= 101). 08/01/21   Albertine Grates, MD  amoxicillin-clavulanate (AUGMENTIN) 500-125 MG tablet 1 tab po bid x 10 days 09/22/22   Saguier, Ramon Dredge, PA-C  apixaban (ELIQUIS) 2.5 MG TABS tablet TAKE 1 TABLET(2.5 MG) BY MOUTH TWICE DAILY 07/19/22   Camnitz, Andree Coss, MD  Ascorbic Acid (VITAMIN C) 1000 MG tablet Take 1,000 mg by mouth daily.    [provider]  Calcium Carbonate (CALCIUM 600 PO) Take 600 mg by mouth daily.    [provider]  Cholecalciferol (VITAMIN D) 2000 UNITS CAPS Take 2,000 Units by mouth daily.    [provider]  ferrous gluconate (FERGON) 324 MG tablet Take 1 tablet (324 mg total) by mouth daily. 09/07/21   Donato Schultz, DO   furosemide (LASIX) 40 MG tablet Take 1 tablet (40 mg total) by mouth daily. 08/19/22   Donato Schultz, DO  hydrOXYzine (VISTARIL) 25 MG capsule Take 1 capsule (25 mg total) by mouth every 8 (eight) hours as needed for anxiety. 08/19/22   Donato Schultz, DO  mupirocin ointment (BACTROBAN) 2 % Apply 1 Application topically 2 (two) times daily. 09/22/22   Saguier, Ramon Dredge, PA-C  NONFORMULARY OR COMPOUNDED ITEM Compression socks  20-30 mm/hg #1  as directed 08/19/22   Zola Button, Grayling Congress, DO  pantoprazole (PROTONIX) 40 MG tablet Take 1 tablet (40 mg total) by mouth daily. 08/01/21   Albertine Grates, MD  Polyethyl Glycol-Propyl Glycol (SYSTANE ULTRA OP) Place 1 drop into both eyes daily as needed (for dry eyes).    [provider]  polyethylene glycol (MIRALAX / GLYCOLAX) 17 g packet Take 17 g by mouth 2 (two) times daily. 08/01/21   Albertine Grates, MD  pregabalin (LYRICA) 50 MG capsule Take 1 capsule (50 mg total) by mouth at bedtime. 08/19/22 02/15/23  Donato Schultz, DO  sulfamethoxazole-trimethoprim (BACTRIM DS) 800-160 MG tablet Take 1 tablet by mouth 2 (two) times daily. 09/25/22   Saguier, Ramon Dredge, PA-C  thiamine 100 MG tablet Take 1 tablet (100 mg total) by mouth daily. 08/01/21   Albertine Grates, MD  vitamin B-12 (CYANOCOBALAMIN) 1000 MCG tablet Take 1 tablet (1,000 mcg total)  by mouth daily. 08/01/21   Albertine Grates, MD      Allergies    Codeine, Duloxetine, and Sertraline hcl    Review of Systems   Review of Systems  Respiratory:  Positive for cough.   Cardiovascular:  Positive for chest pain.    Physical Exam Updated Vital Signs BP 134/63   Pulse 87   Temp 98 F (36.7 C)   Resp 17   Ht 5\' 5"  (1.651 m)   Wt 61.2 kg   SpO2 94%   BMI 22.47 kg/m  Physical Exam Vitals and nursing note reviewed.  Constitutional:      General: She is not in acute distress.    Appearance: She is well-developed.  HENT:     Head: Normocephalic and atraumatic.  Eyes:     Extraocular Movements:  Extraocular movements intact.  Neck:     Vascular: JVD present.  Cardiovascular:     Rate and Rhythm: Normal rate and regular rhythm.     Pulses:          Radial pulses are 2+ on the right side and 2+ on the left side.     Heart sounds: Normal heart sounds.  Pulmonary:     Effort: Pulmonary effort is normal.     Breath sounds: Normal breath sounds.  Abdominal:     Palpations: Abdomen is soft.     Tenderness: There is no abdominal tenderness.  Musculoskeletal:        General: Normal range of motion.     Cervical back: Normal range of motion and neck supple.     Right lower leg: Tenderness (2+ to the knees) present.     Left lower leg: Tenderness (2+ to the knees) present.  Skin:    General: Skin is warm and dry.  Neurological:     General: No focal deficit present.     Mental Status: She is alert and oriented to person, place, and time.  Psychiatric:        Mood and Affect: Mood normal.        Behavior: Behavior normal.     ED Results / Procedures / Treatments   Labs (all labs ordered are listed, but only abnormal results are displayed) Labs Reviewed  BASIC METABOLIC PANEL - Abnormal; Notable for the following components:      Result Value   Sodium 134 (*)    BUN 31 (*)    Creatinine, Ser 1.62 (*)    GFR, Estimated 30 (*)    All other components within normal limits  CBC - Abnormal; Notable for the following components:   RBC 3.72 (*)    Hemoglobin 11.3 (*)    HCT 34.8 (*)    All other components within normal limits  BRAIN NATRIURETIC PEPTIDE - Abnormal; Notable for the following components:   B Natriuretic Peptide 514.8 (*)    All other components within normal limits  RESP PANEL BY RT-PCR (RSV, FLU A&B, COVID)  RVPGX2  TROPONIN I (HIGH SENSITIVITY)  TROPONIN I (HIGH SENSITIVITY)    EKG EKG Interpretation Date/Time:  Friday February 04 2023 20:59:46 EDT Ventricular Rate:  90 PR Interval:    QRS Duration:  138 QT Interval:  364 QTC Calculation: 446 R  Axis:   138  Text Interpretation: Normal sinus rhythm Nonspecific intraventricular conduction delay Anteroseptal infarct, age indeterminate Lateral leads are also involved Artifact in lead(s) I II III aVR aVL aVF V1 V2 V3 V4 V6 Since last tracing of earlier today  No significant change was found Reconfirmed by Elayne Snare ([id number]) on 02/04/2023 10:14:11 PM  Radiology DG Chest 2 View  Result Date: 02/04/2023 CLINICAL DATA:  Chest congestion EXAM: CHEST - 2 VIEW COMPARISON:  07/26/2021 FINDINGS: Cardiac enlargement. Perihilar interstitial changes are progressed since prior study, probably edema but could indicate multifocal pneumonia. No pleural effusions. No pneumothorax. Mediastinal contours appear intact. Left apical pleural calcification, likely postinflammatory. Unchanged. Calcification of the aorta. Lumbar scoliosis convex towards the right. IMPRESSION: Cardiac enlargement. Increasing perihilar interstitial changes likely edema but could indicate multifocal pneumonia. Electronically Signed   By: Burman Nieves M.D.   On: 02/04/2023 19:07    Procedures Procedures    Medications Ordered in ED Medications  furosemide (LASIX) injection 40 mg (40 mg Intravenous Given 02/04/23 2300)    ED Course/ Medical Decision Making/ A&P                                 Medical Decision Making This patient presents to the ED with chief complaint(s) of cough, chest tightness with pertinent past medical history of A-fib on Eliquis, CHF, hypertension which further complicates the presenting complaint. The complaint involves an extensive differential diagnosis and also carries with it a high risk of complications and morbidity.    The differential diagnosis includes ACS, arrhythmia, anemia, pneumonia, pneumothorax, pulmonary edema, pleural effusion, CHF exacerbation, viral syndrome  Additional history obtained: Additional history obtained from spouse Records reviewed outpatient cardiology records  ED  Course and Reassessment: On patient's arrival she is hemodynamically stable in no acute distress.  She was initially evaluated in triage and had EKG and labs performed.  EKG is likely rate controlled atrial fibrillation though limited secondary to artifact, no ischemic changes.  She had 2 troponins that were negative.  Chest x-ray showed pulmonary edema versus multifocal pneumonia.  She has no leukocytosis and no fever and with her JVD and lower extremity edema exam is more consistent with pulmonary edema and CHF exacerbation.  BNP is mildly elevated to 514.  Patient was given IV Lasix here.  She is satting well on room air in no respiratory distress and is stable for discharge home.  Was recommended to take Lasix every day for the next week and to follow-up with her cardiologist to have her symptoms rechecked.  She was given strict return precautions.  Independent labs interpretation:  The following labs were independently interpreted: Elevated BNP at 500, labs otherwise at baseline without acute abnormality  Independent visualization of imaging: - I independently visualized the following imaging with scope of interpretation limited to determining acute life threatening conditions related to emergency care: Chest x-ray, which revealed pulmonary edema  Consultation: - Consulted or discussed management/test interpretation w/ external professional: N/a  Consideration for admission or further workup: Patient has no emergent conditions requiring admission or further work-up at this time and is stable for discharge home with primary care follow-up  Social Determinants of health: N/A    Amount and/or Complexity of Data Reviewed Labs: ordered. Radiology: ordered.  Risk Prescription drug management.          Final Clinical Impression(s) / ED Diagnoses Final diagnoses:  Acute on chronic congestive heart failure, unspecified heart failure type (HCC)  Acute pulmonary edema [facility])    Rx /  DC Orders ED Discharge Orders     None         Rexford Maus, DO 02/04/23 2321

## 2023-02-04 NOTE — Discharge Instructions (Addendum)
You were seen in the emergency department for your cough and your chest tightness.  You had no signs of heart attack or stress on your heart but did have signs of extra fluid on your lungs and your legs likely related to your heart.  We gave you a dose of Lasix through the IV in the emergency department and you should take 40 mg of Lasix every day for the next week.  You can follow-up with your cardiologist to have your symptoms rechecked.  You should return to the emergency department for significantly worsening chest pain, severe shortness of breath or any other new or concerning symptoms.

## 2023-02-04 NOTE — ED Provider Triage Note (Addendum)
Emergency Medicine Provider Triage Evaluation Note  Krystal Henderson , a 89 y.o. female  was evaluated in triage.  Pt complains of chest pains and shortness of breath.  Patient has a history of previous pulmonary embolus, A-fib which is chronic on Eliquis.  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  Patient brought in by her husband for cough and chest pain.  She denies orthopnea, PND exertional dyspnea.  She has swelling in both lower extremities which is unchanged.  Review of Systems  Positive: cough Negative: loc  Physical Exam  BP (!) 144/66   Pulse 82   Temp 97.7 F (36.5 C) (Oral)   Resp 18   Ht 5\' 5"  (1.651 m)   Wt 61.2 kg   SpO2 96%   BMI 22.47 kg/m  Gen:   Awake, no distress   Resp:  Normal effort , coarse crackle MSK:   Moves extremities without difficulty  Other:    Medical Decision Making  Medically screening exam initiated at 9:08 PM.  Appropriate orders placed.  Krystal Henderson was informed that the remainder of the evaluation will be completed by another provider, this initial triage assessment does not replace that evaluation, and the importance of remaining in the ED until their evaluation is complete.  I personally visualized and interpreted the images using our PACS system. Acute findings include:  Pulmonary edema vs multifocal pneumonia BNP added.   No hypoxia- rate controlled Afib. HDS Arthor Captain, PA-C 02/04/23 2112    Arthor Captain, PA-C 02/04/23 2112

## 2023-02-16 ENCOUNTER — Other Ambulatory Visit: Payer: Self-pay | Admitting: Family Medicine

## 2023-02-16 DIAGNOSIS — G609 Hereditary and idiopathic neuropathy, unspecified: Secondary | ICD-10-CM

## 2023-02-17 ENCOUNTER — Telehealth: Payer: Self-pay

## 2023-02-17 NOTE — Telephone Encounter (Signed)
Transition Care Management Unsuccessful Follow-up Telephone Call  Date of discharge and from where:  Krystal Henderson 11/1  Attempts:  1st Attempt  Reason for unsuccessful TCM follow-up call:  No answer/busy   Lenard Forth Clarence  [facility], [facility] Guide, Phone: [phone] Website: [email or web address]

## 2023-02-17 NOTE — Telephone Encounter (Signed)
Requesting: Lyrica 50mg   Contract: None UDS: None Last Visit: 08/19/22 Next Visit: 02/24/23 Last Refill: 08/19/22 #90 and 1RF   Please Advise

## 2023-02-18 ENCOUNTER — Telehealth: Payer: Self-pay

## 2023-02-18 NOTE — Telephone Encounter (Signed)
Transition Care Management Unsuccessful Follow-up Telephone Call  Date of discharge and from where:  Krystal Henderson 11/1  Attempts:  2nd Attempt  Reason for unsuccessful TCM follow-up call:  No answer/busy   Krystal Henderson  [facility], [facility] Guide, Phone: [phone] Website: [email or web address]

## 2023-02-24 ENCOUNTER — Encounter: Payer: Self-pay | Admitting: Family Medicine

## 2023-02-24 ENCOUNTER — Ambulatory Visit: Payer: Medicare Other | Admitting: Family Medicine

## 2023-02-24 VITALS — BP 122/72 | HR 93 | Temp 98.1°F | Resp 18 | Ht 65.0 in | Wt 134.8 lb

## 2023-02-24 DIAGNOSIS — E538 Deficiency of other specified B group vitamins: Secondary | ICD-10-CM | POA: Diagnosis not present

## 2023-02-24 DIAGNOSIS — E559 Vitamin D deficiency, unspecified: Secondary | ICD-10-CM

## 2023-02-24 DIAGNOSIS — D649 Anemia, unspecified: Secondary | ICD-10-CM

## 2023-02-24 DIAGNOSIS — Z23 Encounter for immunization: Secondary | ICD-10-CM | POA: Diagnosis not present

## 2023-02-24 DIAGNOSIS — N184 Chronic kidney disease, stage 4 (severe): Secondary | ICD-10-CM | POA: Diagnosis not present

## 2023-02-24 DIAGNOSIS — I10 Essential (primary) hypertension: Secondary | ICD-10-CM | POA: Diagnosis not present

## 2023-02-24 MED ORDER — FERROUS GLUCONATE 324 (38 FE) MG PO TABS: 324.0000 mg | ORAL_TABLET | Freq: Every day | ORAL | 3 refills | Status: DC

## 2023-02-24 NOTE — Progress Notes (Signed)
Established Patient Office Visit  Subjective   Patient ID: LAYLIA HRABIK, female    DOB: 05-17-1932  Age: 89 y.o. MRN: [id number]  Chief Complaint  Patient presents with   Follow-up    HPI Discussed the use of AI scribe software for clinical note transcription with the patient, who gave verbal consent to proceed.  History of Present Illness   The patient, with a history of neuropathy, congestive heart failure, and back pain due to a collapsed vertebra, presents with concerns about weight loss and persistent back pain. She reports her legs are more red than usual, but denies any discomfort. She takes daily medication for neuropathy and reports no change in the condition of her legs.  The patient recently had an episode of extreme weakness, leading to an ER visit. The ER diagnosed a mild congestive heart failure and administered a diuretic. The patient was instructed to take Lasix daily for a week and follow up with a cardiologist. She reports feeling about the same after returning to her regular medication schedule. She denies any symptoms of congestive heart failure, such as shortness of breath or weight gain, but expresses concern about continued weight loss despite eating regularly.  The patient's back pain remains a significant issue. She reports taking Tylenol once a day at bedtime for pain management, which provides some relief. She has tried [employer] patches in the past, but found them ineffective. She was deemed not a good candidate for surgery and previous pain management interventions were not particularly helpful.  The patient also reports issues with her eyesight. An ophthalmologist diagnosed her with a wrinkled cornea, which is affecting her vision. She is seeking a cornea specialist for further evaluation and treatment.      Patient Active Problem List   Diagnosis Date Noted   Delayed wound healing 09/28/2022   CHF (congestive heart failure) (HCC) 07/24/2021   Leg wound,  right 07/23/2021   Acute exacerbation of CHF (congestive heart failure) (HCC) 07/22/2021   Generalized weakness    CKD (chronic kidney disease) stage 4, GFR 15-29 ml/min (HCC)    Headache    Venous stasis dermatitis of right lower extremity 08/07/2020   Insomnia 08/07/2020   Acute on chronic combined systolic and diastolic CHF (congestive heart failure) (HCC) 11/12/2019   Paroxysmal atrial fibrillation (HCC) 11/10/2019   Impaired mobility and ADLs 11/10/2019   Acute pulmonary embolism (HCC) 11/08/2019   Impacted cerumen of left ear 01/03/2019   Lesion of skin of scalp 12/30/2018   Edema 06/22/2018   Iron deficiency anemia 04/02/2018   Lumbar post-laminectomy syndrome 07/15/2017   Lumbar compression fracture (HCC) 11/26/2015   Lower extremity edema 10/23/2015   Chronic low back pain 06/21/2014   Pain in joint, shoulder region 02/11/2014   Vitamin D deficiency 03/30/2013   Acute kidney injury (HCC) 01/01/2012   Spinal stenosis 12/23/2011   Osteopenia 12/23/2011   Renal insufficiency, mild 08/24/2011   CAP (community acquired pneumonia) 05/08/2011   Vitamin B12 deficiency 09/24/2010   IBS (irritable bowel syndrome) 09/24/2010   Peripheral neuropathy 09/24/2010   Normocytic anemia 09/24/2010   GERD (gastroesophageal reflux disease) 08/12/2009   DIVERTICULOSIS, COLON 09/08/2007   Angiodysplasia of intestine with hemorrhage 09/08/2007   DJD (degenerative joint disease) 09/08/2007   History of colonic polyps 09/08/2007   HYPERLIPIDEMIA 01/23/2007   HTN (hypertension) 01/23/2007   Osteoporosis 01/23/2007   HYPOGLYCEMIA, REACTIVE 06/30/2006   Past Medical History:  Diagnosis Date   Anemia    hx of  Angiodysplasia 2007   @ colonoscopy   Anxiety    PMH of   Chronic low back pain 06/21/2014   Diverticulosis of colon (without mention of hemorrhage)    DJD (degenerative joint disease)    Esophageal reflux    inactive   History of vertebral fracture 11/2015   HTN  (hypertension)    Hyperlipemia 2006   LDL 130   Hypoglycemia    reactive   Pelvic fracture (HCC) 12/30/2011   [facility]   Peripheral neuropathy    Personal history of colonic polyps    adenomatous   Vitamin B12 deficiency    Past Surgical History:  Procedure Laterality Date   cataract surgery  12-26-12 and 01-09-13   COLONOSCOPY  2011   neg   COLONOSCOPY W/ POLYPECTOMY     X 2 , Dr  Sheryn Bison; angiodysplasia. Due 2022   DILATION AND CURETTAGE OF UTERUS     FACIAL COSMETIC SURGERY     HEMORROIDECTOMY     LUMBAR LAMINECTOMY/DECOMPRESSION MICRODISCECTOMY  09/10/2011   Procedure: LUMBAR LAMINECTOMY/DECOMPRESSION MICRODISCECTOMY;  Surgeon: Javier Docker, MD;  Location: [facility];  Service: Orthopedics;  Laterality: N/A;  decompression laminectomy L2-3, L3-4, L4-5   ORIF WRIST FRACTURE  01/11/2012   Procedure: OPEN REDUCTION INTERNAL FIXATION (ORIF) WRIST FRACTURE;  Surgeon: Tami Ribas, MD;  Location: [facility];  Service: Orthopedics;  Laterality: Right;   TEAR DUCT PROBING     X 2   TOTAL HIP ARTHROPLASTY  2000   right   TUBAL LIGATION     Social History   Tobacco Use   Smoking status: Never   Smokeless tobacco: Never  Substance Use Topics   Alcohol use: Yes    Alcohol/week: 3.0 standard drinks of alcohol    Types: 3 Glasses of wine per week    Comment: wine occasionally   Drug use: No   Social History   Socioeconomic History   Marital status: Married    Spouse name: Peyton Najjar   Number of children: 1   Years of education: hs   Highest education level: 12th grade  Occupational History   Occupation: retired    Associate Professor: RETIRED  Tobacco Use   Smoking status: Never   Smokeless tobacco: Never  Substance and Sexual Activity   Alcohol use: Yes    Alcohol/week: 3.0 standard drinks of alcohol    Types: 3 Glasses of wine per week    Comment: wine occasionally   Drug use: No   Sexual activity: Yes    Birth control/protection: Diaphragm  Other  Topics Concern   Not on file  Social History Narrative   Patient is right handed.   Patient does not drink caffeine.   Lives with husband   Lucila [place] lives nearby   Social Determinants of Health   Financial Resource Strain: Low Risk  (02/22/2023)   Overall Financial Resource Strain (CARDIA)    Difficulty of Paying Living Expenses: Not hard at all  Food Insecurity: No Food Insecurity (02/22/2023)   Hunger Vital Sign    Worried About Running Out of Food in the Last Year: Never true    Ran Out of Food in the Last Year: Never true  Transportation Needs: No Transportation Needs (02/22/2023)   PRAPARE - Administrator, Civil Service (Medical): No    Lack of Transportation (Non-Medical): No  Physical Activity: Unknown (02/22/2023)   Exercise Vital Sign    Days of Exercise per Week: 0 days  Minutes of Exercise per Session: Not on file  Stress: No Stress Concern Present (02/22/2023)   [employer] of Occupational Health - Occupational Stress Questionnaire    Feeling of Stress : Only a little  Social Connections: Moderately Isolated (02/22/2023)   Social Connection and Isolation Panel [NHANES]    Frequency of Communication with Friends and Family: Twice a week    Frequency of Social Gatherings with Friends and Family: Once a week    Attends Religious Services: Never    Database administrator or Organizations: No    Attends Engineer, structural: Not on file    Marital Status: Married  Catering manager Violence: Not At Risk (12/27/2020)   Humiliation, Afraid, Rape, and Kick questionnaire    Fear of Current or Ex-Partner: No    Emotionally Abused: No    Physically Abused: No    Sexually Abused: No   Family Status  Relation Name Status   Father  Deceased   Mother  Deceased   Sister  (Not Specified)   Brother Remi Deter Deceased   Brother arthur Deceased   Mat Education officer, community  (Not Specified)   Brother  Deceased   Daughter  (Not Specified)   Brother william Alive    Neg Hx  (Not Specified)  No partnership data on file   Family History  Problem Relation Age of Onset   Heart attack Father 13   Kidney disease Mother        renal failure   Throat cancer Sister        smoker   Osteoporosis Sister    Heart attack Brother 29   Stroke Brother 47       smoker   Depression Maternal Uncle    Cancer Brother        X3  lung cancer, all smokers   Peripheral vascular disease Daughter    Colon cancer Neg Hx    Diabetes Neg Hx    Allergies  Allergen Reactions   Codeine Nausea Only   Duloxetine Other (See Comments)    REACTION: intolerance--She does not remember taking this Rx- states she did not feel well    Sertraline Hcl Other (See Comments)    REACTION: intolerance-- Did not help with Depression      ROS    Objective:     BP 122/72 (BP Location: Left Arm, Patient Position: Sitting, Cuff Size: Normal)   Pulse 93   Temp 98.1 F (36.7 C) (Oral)   Resp 18   Ht 5\' 5"  (1.651 m)   Wt 134 lb 12.8 oz (61.1 kg)   SpO2 96%   BMI 22.43 kg/m  BP Readings from Last 3 Encounters:  02/24/23 122/72  02/04/23 134/63  01/10/23 114/62   Wt Readings from Last 3 Encounters:  02/24/23 134 lb 12.8 oz (61.1 kg)  02/04/23 135 lb (61.2 kg)  01/10/23 132 lb (59.9 kg)   SpO2 Readings from Last 3 Encounters:  02/24/23 96%  02/04/23 94%  01/10/23 96%      Physical Exam   No results found for any visits on 02/24/23.  Last CBC Lab Results  Component Value Date   WBC 4.0 02/04/2023   HGB 11.3 (L) 02/04/2023   HCT 34.8 (L) 02/04/2023   MCV 93.5 02/04/2023   MCH 30.4 02/04/2023   RDW 15.5 02/04/2023   PLT 213 02/04/2023   Last metabolic panel Lab Results  Component Value Date   GLUCOSE 96 02/04/2023   NA 134 (L) 02/04/2023  K 3.9 02/04/2023   CL 103 02/04/2023   CO2 22 02/04/2023   BUN 31 (H) 02/04/2023   CREATININE 1.62 (H) 02/04/2023   GFRNONAA 30 (L) 02/04/2023   CALCIUM 9.2 02/04/2023   PHOS 4.9 (H) 07/31/2021   PROT 7.1  09/16/2022   ALBUMIN 4.0 09/16/2022   BILITOT 0.8 09/16/2022   ALKPHOS 58 09/16/2022   AST 13 09/16/2022   ALT 9 09/16/2022   ANIONGAP 9 02/04/2023   Last lipids Lab Results  Component Value Date   CHOL 160 08/19/2022   HDL 56.60 08/19/2022   LDLCALC 88 08/19/2022   LDLDIRECT 129.0 10/18/2014   TRIG 74.0 08/19/2022   CHOLHDL 3 08/19/2022   Last hemoglobin A1c Lab Results  Component Value Date   HGBA1C 5.7 (H) 11/09/2019   Last thyroid functions Lab Results  Component Value Date   TSH 3.311 07/23/2021   Last vitamin D Lab Results  Component Value Date   VD25OH 74.63 08/19/2022   Last vitamin B12 and Folate Lab Results  Component Value Date   VITAMINB12 402 08/19/2022   FOLATE >24.8 09/24/2010      The ASCVD Risk score (Arnett DK, et al., 2019) failed to calculate for the following reasons:   The 2019 ASCVD risk score is only valid for ages 57 to 48    Assessment & Plan:   Problem List Items Addressed This Visit       Unprioritized   Vitamin D deficiency   Relevant Orders   VITAMIN D 25 Hydroxy (Vit-D Deficiency, Fractures)   Normocytic anemia   Relevant Medications   ferrous gluconate (FERGON) 324 MG tablet   Other Relevant Orders   CBC with Differential/Platelet   Iron, TIBC and Ferritin Panel   HTN (hypertension) - Primary   Relevant Orders   Lipid panel   TSH   Other Visit Diagnoses     B12 deficiency       Relevant Orders   Vitamin B12   CKD (chronic kidney disease), stage IV (HCC)       Relevant Orders   CBC with Differential/Platelet   Comprehensive metabolic panel   Need for influenza vaccination       Relevant Orders   Flu Vaccine Trivalent High Dose (Fluad)     Assessment and Plan    Congestive Heart Failure   Following a recent episode managed in the ER with diuretics, she reports stable weight and no significant dyspnea. We emphasized the importance of monitoring weight and symptoms to prevent fluid overload. We will send a  message to her cardiologist regarding the ER visit and the need for follow-up and encourage her to follow up with her cardiologist.  Weight Loss   She has experienced significant weight loss despite eating, as she is not consuming enough calories. We recommended nutritional supplements to increase caloric intake. We will encourage the consumption of Boost or Ensure between meals and schedule a follow-up in one month for a weight check.  Peripheral Neuropathy   She has chronic peripheral neuropathy in her legs, which is managed with daily medication, and no new symptoms have been reported. We will continue the current medication regimen.  Vertebral Compression Fracture   She experiences chronic back pain due to a vertebral compression fracture and is not a candidate for surgery. Pain is managed with Tylenol, which provides some relief. We discussed alternative pain management options. We will continue Tylenol as needed and consider alternative pain management options if the pain worsens.  Corneal  Issues   A recent eye exam indicated corneal issues, and a referral to a cornea specialist was recommended. We will refer her to Dr. Pricilla Holm for a corneal evaluation.  General Health Maintenance   She requires a flu vaccination and routine lab work. We will administer the flu shot and order routine lab work to check hemoglobin levels.  Follow-up   We will schedule a follow-up in one month for a weight check and ensure she follows up with her cardiologist.        Return in about 4 weeks (around 03/24/2023).    Donato Schultz, DO

## 2023-02-24 NOTE — Patient Instructions (Signed)
High-Protein and High-Calorie Diet Eating high-protein and high-calorie foods can help you to gain weight, heal after an injury, and recover after an illness or surgery. The specific amount of daily protein and calories you need depends on: Your body weight. The reason this diet is recommended for you. Generally, a high-protein, high-calorie diet involves: Eating 250-500 extra calories each day. Making sure that you get enough of your daily calories from protein. Ask your health care provider how many of your calories should come from protein. Talk with a health care provider or a dietitian about how much protein and how many calories you need each day. Follow the diet as directed by your health care provider. What are tips for following this plan?  Reading food labels Check the nutrition facts label for calories, grams of fat and protein. Items with more than 4 grams of protein are high-protein foods. Preparing meals Add whole milk, half-and-half, or heavy cream to cereal, pudding, soup, or hot cocoa. Add whole milk to instant breakfast drinks. Add peanut butter to oatmeal or smoothies. Add powdered milk to baked goods, smoothies, or milkshakes. Add powdered milk, cream, or butter to mashed potatoes. Add cheese to cooked vegetables. Make whole-milk yogurt parfaits. Top them with granola, fruit, or nuts. Add cottage cheese to fruit. Add avocado, cheese, or both to sandwiches or salads. Add avocado to smoothies. Add meat, poultry, or seafood to rice, pasta, casseroles, salads, and soups. Use mayonnaise when making egg salad, chicken salad, or tuna salad. Use peanut butter as a dip for fruits and vegetables or as a topping for pretzels, celery, or crackers. Add beans to casseroles, dips, and spreads. Add pureed beans to sauces and soups. Replace calorie-free drinks with calorie-containing drinks, such as milk and fruit juice. Replace water with milk or heavy cream when making foods such as  oatmeal, pudding, or cocoa. Add oil or butter to cooked vegetables and grains. Add cream cheese to sandwiches or as a topping on crackers and bread. Make cream-based pastas and soups. General information Ask your health care provider if you should take a nutritional supplement. Try to eat six small meals each day instead of three large meals. A general goal is to eat every 2 to 3 hours. Eat a balanced diet. In each meal, include one food that is high in protein and one food with fat in it. Keep nutritious snacks available, such as nuts, trail mixes, dried fruit, and yogurt. If you have kidney disease or diabetes, talk with your health care provider about how much protein is safe for you. Too much protein may put extra stress on your kidneys. Drink your calories. Choose high-calorie drinks and have them after your meals. Consider setting a timer to remind you to eat. You will want to eat even if you do not feel very hungry. What high-protein foods should I eat?  Vegetables Soybeans. Peas. Grains Quinoa. Bulgur wheat. Buckwheat. Meats and other proteins Beef, pork, and poultry. Fish and seafood. Eggs. Tofu. Textured vegetable protein (TVP). Peanut butter. Nuts and seeds. Dried beans. Protein powders. Hummus. Dairy Whole milk. Whole-milk yogurt. Powdered milk. Cheese. Cottage Cheese. Eggnog. Beverages High-protein supplement drinks. Soy milk. Other foods Protein bars. The items listed above may not be a complete list of foods and beverages you can eat and drink. Contact a dietitian for more information. What high-calorie foods should I eat? Fruits Dried fruit. Fruit leather. Canned fruit in syrup. Fruit juice. Avocado. Vegetables Vegetables cooked in oil or butter. Fried potatoes. Grains   Pasta. Quick breads. Muffins. Pancakes. Ready-to-eat cereal. Meats and other proteins Peanut butter. Nuts and seeds. Dairy Heavy cream. Whipped cream. Cream cheese. Sour cream. Ice cream. Custard.  Pudding. Whole milk dairy products. Beverages Meal-replacement beverages. Nutrition shakes. Fruit juice. Seasonings and condiments Salad dressing. Mayonnaise. Alfredo sauce. Fruit preserves or jelly. Honey. Syrup. Sweets and desserts Cake. Cookies. Pie. Pastries. Candy bars. Chocolate. Fats and oils Butter or margarine. Oil. Gravy. Other foods Meal-replacement bars. The items listed above may not be a complete list of foods and beverages you can eat and drink. Contact a dietitian for more information. Summary A high-protein, high-calorie diet can help you gain weight or heal faster after an injury, illness, or surgery. To increase your protein and calories, add ingredients such as whole milk, peanut butter, cheese, beans, meat, or seafood to meal items. To get enough extra calories each day, include high-calorie foods and beverages at each meal. Adding a high-calorie drink or shake can be an easy way to help you get enough calories each day. Talk with your healthcare provider or dietitian about the best options for you. This information is not intended to replace advice given to you by your health care provider. Make sure you discuss any questions you have with your health care provider. Document Revised: 02/22/2020 Document Reviewed: 02/24/2020 Elsevier Patient Education  2024 Elsevier Inc.  

## 2023-02-25 LAB — COMPREHENSIVE METABOLIC PANEL
ALT: 18 U/L (ref 0–35)
AST: 16 U/L (ref 0–37)
Albumin: 4 g/dL (ref 3.5–5.2)
Alkaline Phosphatase: 75 U/L (ref 39–117)
BUN: 28 mg/dL — ABNORMAL HIGH (ref 6–23)
CO2: 27 meq/L (ref 19–32)
Calcium: 9.8 mg/dL (ref 8.4–10.5)
Chloride: 100 meq/L (ref 96–112)
Creatinine, Ser: 1.95 mg/dL — ABNORMAL HIGH (ref 0.40–1.20)
GFR: 22.54 mL/min — ABNORMAL LOW (ref >60.00)
Glucose, Bld: 91 mg/dL (ref 70–99)
Potassium: 4.4 meq/L (ref 3.5–5.1)
Sodium: 139 meq/L (ref 135–145)
Total Bilirubin: 0.8 mg/dL (ref 0.2–1.2)
Total Protein: 6.7 g/dL (ref 6.0–8.3)

## 2023-02-25 LAB — CBC WITH DIFFERENTIAL/PLATELET
Basophils Absolute: 0.1 10*3/uL (ref 0.0–0.1)
Basophils Relative: 1 % (ref 0.0–3.0)
Eosinophils Absolute: 0.1 10*3/uL (ref 0.0–0.7)
Eosinophils Relative: 1.5 % (ref 0.0–5.0)
HCT: 39.3 % (ref 36.0–46.0)
Hemoglobin: 12.4 g/dL (ref 12.0–15.0)
Lymphocytes Relative: 18.2 % (ref 12.0–46.0)
Lymphs Abs: 0.9 10*3/uL (ref 0.7–4.0)
MCHC: 31.6 g/dL (ref 30.0–36.0)
MCV: 94.4 fL (ref 78.0–100.0)
Monocytes Absolute: 0.5 10*3/uL (ref 0.1–1.0)
Monocytes Relative: 9.6 % (ref 3.0–12.0)
Neutro Abs: 3.5 10*3/uL (ref 1.4–7.7)
Neutrophils Relative %: 69.7 % (ref 43.0–77.0)
Platelets: 241 10*3/uL (ref 150.0–400.0)
RBC: 4.17 Mil/uL (ref 3.87–5.11)
RDW: 16.5 % — ABNORMAL HIGH (ref 11.5–15.5)
WBC: 5 10*3/uL (ref 4.0–10.5)

## 2023-02-25 LAB — VITAMIN D 25 HYDROXY (VIT D DEFICIENCY, FRACTURES): VITD: 65.3 ng/mL (ref 30.00–100.00)

## 2023-02-25 LAB — IBC + FERRITIN
Ferritin: 82.8 ng/mL (ref 10.0–291.0)
Iron: 63 ug/dL (ref 42–145)
Saturation Ratios: 16.3 % — ABNORMAL LOW (ref 20.0–50.0)
TIBC: 386.4 ug/dL (ref 250.0–450.0)
Transferrin: 276 mg/dL (ref 212.0–360.0)

## 2023-02-25 LAB — LIPID PANEL
Cholesterol: 166 mg/dL (ref 0–200)
HDL: 69.5 mg/dL (ref >39.00)
LDL Cholesterol: 82 mg/dL (ref 0–99)
NonHDL: 96.52
Total CHOL/HDL Ratio: 2
Triglycerides: 74 mg/dL (ref 0.0–149.0)
VLDL: 14.8 mg/dL (ref 0.0–40.0)

## 2023-02-25 LAB — VITAMIN B12: Vitamin B-12: 153 pg/mL — ABNORMAL LOW (ref 211–911)

## 2023-02-25 LAB — TSH: TSH: 2.81 u[IU]/mL (ref 0.35–5.50)

## 2023-03-11 ENCOUNTER — Emergency Department (HOSPITAL_COMMUNITY): Payer: Medicare Other

## 2023-03-11 ENCOUNTER — Emergency Department (HOSPITAL_COMMUNITY)
Admission: EM | Admit: 2023-03-11 | Discharge: 2023-03-11 | Disposition: A | Discharge status: Home / Self Care | Payer: Medicare Other | Attending: Emergency Medicine | Admitting: Emergency Medicine

## 2023-03-11 ENCOUNTER — Other Ambulatory Visit: Payer: Self-pay

## 2023-03-11 ENCOUNTER — Encounter (HOSPITAL_COMMUNITY): Payer: Self-pay

## 2023-03-11 DIAGNOSIS — N200 Calculus of kidney: Secondary | ICD-10-CM | POA: Diagnosis not present

## 2023-03-11 DIAGNOSIS — S22080A Wedge compression fracture of T11-T12 vertebra, initial encounter for closed fracture: Secondary | ICD-10-CM | POA: Diagnosis not present

## 2023-03-11 DIAGNOSIS — M549 Dorsalgia, unspecified: Secondary | ICD-10-CM | POA: Diagnosis not present

## 2023-03-11 DIAGNOSIS — X58XXXA Exposure to other specified factors, initial encounter: Secondary | ICD-10-CM | POA: Diagnosis not present

## 2023-03-11 DIAGNOSIS — S22088A Other fracture of T11-T12 vertebra, initial encounter for closed fracture: Secondary | ICD-10-CM | POA: Diagnosis not present

## 2023-03-11 DIAGNOSIS — K5730 Diverticulosis of large intestine without perforation or abscess without bleeding: Secondary | ICD-10-CM | POA: Diagnosis not present

## 2023-03-11 DIAGNOSIS — S32010A Wedge compression fracture of first lumbar vertebra, initial encounter for closed fracture: Secondary | ICD-10-CM | POA: Diagnosis not present

## 2023-03-11 DIAGNOSIS — I509 Heart failure, unspecified: Secondary | ICD-10-CM | POA: Diagnosis not present

## 2023-03-11 DIAGNOSIS — Z7901 Long term (current) use of anticoagulants: Secondary | ICD-10-CM | POA: Insufficient documentation

## 2023-03-11 DIAGNOSIS — N189 Chronic kidney disease, unspecified: Secondary | ICD-10-CM | POA: Diagnosis not present

## 2023-03-11 DIAGNOSIS — M40205 Unspecified kyphosis, thoracolumbar region: Secondary | ICD-10-CM | POA: Diagnosis not present

## 2023-03-11 DIAGNOSIS — S32018A Other fracture of first lumbar vertebra, initial encounter for closed fracture: Secondary | ICD-10-CM | POA: Diagnosis not present

## 2023-03-11 DIAGNOSIS — R29890 Loss of height: Secondary | ICD-10-CM | POA: Diagnosis not present

## 2023-03-11 DIAGNOSIS — Z743 Need for continuous supervision: Secondary | ICD-10-CM | POA: Diagnosis not present

## 2023-03-11 LAB — CBC WITH DIFFERENTIAL/PLATELET
Abs Immature Granulocytes: 0.02 10*3/uL (ref 0.00–0.07)
Basophils Absolute: 0 10*3/uL (ref 0.0–0.1)
Basophils Relative: 1 %
Eosinophils Absolute: 0.1 10*3/uL (ref 0.0–0.5)
Eosinophils Relative: 1 %
HCT: 37 % (ref 36.0–46.0)
Hemoglobin: 12.2 g/dL (ref 12.0–15.0)
Immature Granulocytes: 0 %
Lymphocytes Relative: 11 %
Lymphs Abs: 0.8 10*3/uL (ref 0.7–4.0)
MCH: 30.6 pg (ref 26.0–34.0)
MCHC: 33 g/dL (ref 30.0–36.0)
MCV: 92.7 fL (ref 80.0–100.0)
Monocytes Absolute: 0.6 10*3/uL (ref 0.1–1.0)
Monocytes Relative: 8 %
Neutro Abs: 5.8 10*3/uL (ref 1.7–7.7)
Neutrophils Relative %: 79 %
Platelets: 243 10*3/uL (ref 150–400)
RBC: 3.99 MIL/uL (ref 3.87–5.11)
RDW: 15.9 % — ABNORMAL HIGH (ref 11.5–15.5)
WBC: 7.4 10*3/uL (ref 4.0–10.5)
nRBC: 0 % (ref 0.0–0.2)

## 2023-03-11 LAB — BASIC METABOLIC PANEL
Anion gap: 11 (ref 5–15)
BUN: 32 mg/dL — ABNORMAL HIGH (ref 8–23)
CO2: 24 mmol/L (ref 22–32)
Calcium: 9.3 mg/dL (ref 8.9–10.3)
Chloride: 104 mmol/L (ref 98–111)
Creatinine, Ser: 2.04 mg/dL — ABNORMAL HIGH (ref 0.44–1.00)
GFR, Estimated: 23 mL/min — ABNORMAL LOW (ref >60)
Glucose, Bld: 103 mg/dL — ABNORMAL HIGH (ref 70–99)
Potassium: 4.1 mmol/L (ref 3.5–5.1)
Sodium: 139 mmol/L (ref 135–145)

## 2023-03-11 MED ORDER — OXYCODONE-ACETAMINOPHEN 5-325 MG PO TABS: 1.0000 | ORAL_TABLET | Freq: Four times a day (QID) | ORAL | 0 refills | Status: DC | PRN

## 2023-03-11 MED ORDER — OXYCODONE-ACETAMINOPHEN 5-325 MG PO TABS
1.0000 | ORAL_TABLET | Freq: Once | ORAL | Status: AC
  Administered 2023-03-11: 1 via ORAL
  Filled 2023-03-11: qty 1

## 2023-03-11 MED ORDER — LIDOCAINE 5 % EX PTCH
1.0000 | MEDICATED_PATCH | CUTANEOUS | Status: DC
  Administered 2023-03-11: 1 via TRANSDERMAL
  Filled 2023-03-11: qty 1

## 2023-03-11 NOTE — ED Notes (Signed)
Pt unable to tolerate laying flat for Xray, Dr. Rhunette Croft notified

## 2023-03-11 NOTE — ED Triage Notes (Signed)
BIB EMS from home for mid back pain over the last 3 days. Denies falls or injury. Pt states she was diagnosed with compression fracture a few years ago.

## 2023-03-11 NOTE — Discharge Instructions (Signed)
You were seen in the emergency department for continued back pain.  You had x-rays and a CAT scan of your back.  You have compression fractures of T12 and L1 vertebral bodies.  Dr. Lovell Sheehan neurosurgery is recommending a brace for comfort and pain medication prescription.  Please contact his office for follow-up.  These medications may make you constipated so make sure you are monitoring your bowels and take a stool softener and fiber as needed.

## 2023-03-11 NOTE — ED Notes (Signed)
Ortho tech called for back brace on pt

## 2023-03-11 NOTE — Progress Notes (Signed)
Orthopedic Tech Progress Note Patient Details:  Krystal Henderson 16-Apr-1932 [id number]  Ortho Devices Type of Ortho Device: Lumbar corsett Ortho Device/Splint Location: Lumbar spine Ortho Device/Splint Interventions: Application   Post Interventions Patient Tolerated: Well Instructions Provided: Adjustment of device  [name] 03/11/2023, 8:50 PM

## 2023-03-11 NOTE — ED Notes (Signed)
Pt to xray via stretcher

## 2023-03-11 NOTE — ED Provider Notes (Signed)
EMERGENCY DEPARTMENT AT [facility] Provider Note   CSN: [id number] Arrival date & time: 03/11/23  1148     History {Add pertinent medical, surgical, social history, OB history to HPI:1} Chief Complaint  Patient presents with   Back Pain    Krystal Henderson is a 89 y.o. female.  HPI     Home Medications Prior to Admission medications   Medication Sig Start Date End Date Taking? Authorizing Provider  acetaminophen (TYLENOL) 325 MG tablet Take 2 tablets (650 mg total) by mouth every 6 (six) hours as needed for mild pain (or Fever >/= 101). 08/01/21   Albertine Grates, MD  apixaban (ELIQUIS) 2.5 MG TABS tablet TAKE 1 TABLET(2.5 MG) BY MOUTH TWICE DAILY 07/19/22   Camnitz, Andree Coss, MD  Ascorbic Acid (VITAMIN C) 1000 MG tablet Take 1,000 mg by mouth daily.    [provider]  Calcium Carbonate (CALCIUM 600 PO) Take 600 mg by mouth daily.    [provider]  Cholecalciferol (VITAMIN D) 2000 UNITS CAPS Take 2,000 Units by mouth daily.    [provider]  ferrous gluconate (FERGON) 324 MG tablet Take 1 tablet (324 mg total) by mouth daily. 02/24/23   Donato Schultz, DO  furosemide (LASIX) 40 MG tablet Take 1 tablet (40 mg total) by mouth daily. 08/19/22   Donato Schultz, DO  hydrOXYzine (VISTARIL) 25 MG capsule Take 1 capsule (25 mg total) by mouth every 8 (eight) hours as needed for anxiety. 08/19/22   Donato Schultz, DO  mupirocin ointment (BACTROBAN) 2 % Apply 1 Application topically 2 (two) times daily. 09/22/22   Saguier, Ramon Dredge, PA-C  pantoprazole (PROTONIX) 40 MG tablet Take 1 tablet (40 mg total) by mouth daily. 08/01/21   Albertine Grates, MD  Polyethyl Glycol-Propyl Glycol (SYSTANE ULTRA OP) Place 1 drop into both eyes daily as needed (for dry eyes).    [provider]  polyethylene glycol (MIRALAX / GLYCOLAX) 17 g packet Take 17 g by mouth 2 (two) times daily. 08/01/21   Albertine Grates, MD  pregabalin (LYRICA) 50 MG capsule  TAKE 1 CAPSULE(50 MG) BY MOUTH AT BEDTIME 02/17/23   Seabron Spates R, DO  thiamine 100 MG tablet Take 1 tablet (100 mg total) by mouth daily. 08/01/21   Albertine Grates, MD  vitamin B-12 (CYANOCOBALAMIN) 1000 MCG tablet Take 1 tablet (1,000 mcg total) by mouth daily. 08/01/21   Albertine Grates, MD      Allergies    Codeine, Duloxetine, and Sertraline hcl    Review of Systems   Review of Systems  Physical Exam Updated Vital Signs BP (!) 146/83   Pulse 84   Temp 97.7 F (36.5 C)   Resp 16   SpO2 92%  Physical Exam  ED Results / Procedures / Treatments   Labs (all labs ordered are listed, but only abnormal results are displayed) Labs Reviewed  BASIC METABOLIC PANEL  CBC WITH DIFFERENTIAL/PLATELET    EKG None  Radiology No results found.  Procedures Procedures  {Document cardiac monitor, telemetry assessment procedure when appropriate:1}  Medications Ordered in ED Medications  lidocaine (LIDODERM) 5 % 1 patch (1 patch Transdermal Patch Applied 03/11/23 1343)  oxyCODONE-acetaminophen (PERCOCET/ROXICET) 5-325 MG per tablet 1 tablet (1 tablet Oral Given 03/11/23 1344)    ED Course/ Medical Decision Making/ A&P   {   Click here for ABCD2, HEART and other calculatorsREFRESH Note before signing :1}  Medical Decision Making Amount and/or Complexity of Data Reviewed Labs: ordered. Radiology: ordered.  Risk Prescription drug management.   89 year old patient comes in with chief complaint of severe back pain.  She has previous history of osteopenia, lumbar compression fracture, CHF, PE, A-fib on anticoagulation and CKD.  Patient comes in with a 2-day history of severe back pain.  She has been taking Tylenol without relief.  She lives at home.  Husband at the bedside provides collateral history as well.  I reviewed patient's records from early including x-ray from 2017 when she was found to have compression fracture.  X-ray of the lumbar and thoracic  spine ordered, as it appears that patient is having lower thoracic, upper lumbar spine pain.  She is also having paraspinal tenderness.  X-rays independently interpreted.  It is very difficult to ascertain the x-ray because of low quality study. According to the radiology tech, patient was not able to tolerate laying flat for the imaging.  Plan is to give patient fentanyl, Versed and try CT. Patient will need admission to the hospital for pain management.  Final Clinical Impression(s) / ED Diagnoses Final diagnoses:  None    Rx / DC Orders ED Discharge Orders     None

## 2023-03-11 NOTE — ED Provider Notes (Signed)
Signout from Dr. Rhunette Croft.  89 year old female here with atraumatic mid and low back pain that started a few days ago.  She is very debilitated by this.  Getting plain films of her spine but may need admission for pain control.  Getting some screening labs and CT imaging.  Dispo per results of testing. Physical Exam  BP (!) 146/83   Pulse 84   Temp 97.7 F (36.5 C)   Resp 16   SpO2 92%   Physical Exam  Procedures  Procedures  ED Course / MDM   Clinical Course as of 03/11/23 1934  Fri Mar 11, 2023  9147 Discussed with Dr. Lovell Sheehan neurosurgery.  He is recommending a lumbar corset and pain control, can follow up in office.  Does not think she is an operative candidate at her age with her bone density.  I updated patient and her husband.  She said she would rather go home and think she can manage. [MB]    Clinical Course User Index [MB] Terrilee Files, MD   Medical Decision Making Amount and/or Complexity of Data Reviewed Labs: ordered. Radiology: ordered.  Risk Prescription drug management.   Orthotec is delivered the brace.  I have sent a prescription for pain medicine to the pharmacy.  Will give her contact information for Dr. Lovell Sheehan outpatient.  Patient and husband comfortable plan for discharge.       Terrilee Files, MD 03/12/23 1110

## 2023-03-15 ENCOUNTER — Other Ambulatory Visit: Payer: Self-pay | Admitting: Family Medicine

## 2023-03-15 ENCOUNTER — Telehealth: Payer: Self-pay

## 2023-03-15 DIAGNOSIS — S22080A Wedge compression fracture of T11-T12 vertebra, initial encounter for closed fracture: Secondary | ICD-10-CM

## 2023-03-15 NOTE — Transitions of Care (Post Inpatient/ED Visit) (Signed)
 03/15/2023  Name: Krystal Henderson MRN: [id number] DOB: 1933-04-05  Today's TOC FU Call Status: Today's TOC FU Call Status:: Successful TOC FU Call Completed TOC FU Call Complete Date: 03/15/23 Patient's Name and Date of Birth confirmed.  Transition Care Management Follow-up Telephone Call Date of Discharge: 03/11/23 Discharge Facility: [facility]) Type of Discharge: Emergency Department Reason for ED Visit: Orthopedic Conditions Orthopedic/Injury Diagnosis: Fracture (spine fracture) How have you been since you were released from the hospital?: Worse (poorly. still in a lot of pain. trying to get in to see Dr. Tressie Stalker with [facility]) Any questions or concerns?: No  Items Reviewed: Did you receive and understand the discharge instructions provided?: Yes Medications obtained,verified, and reconciled?: Yes (Medications Reviewed) Any new allergies since your discharge?: No Dietary orders reviewed?: NA Do you have support at home?: Yes People in Home: spouse  Medications Reviewed Today: Medications Reviewed Today     Reviewed by Dellas Guard, Jordan Hawks, CMA (Certified Medical Assistant) on 03/15/23 at 1118  Med List Status: <None>   Medication Order Taking? Sig Documenting Provider Last Dose Status Informant  acetaminophen (TYLENOL) 325 MG tablet [id number] No Take 2 tablets (650 mg total) by mouth every 6 (six) hours as needed for mild pain (or Fever >/= 101). Albertine Grates, MD Taking Active   apixaban [facility]) 2.5 MG TABS tablet [id number] No TAKE 1 TABLET(2.5 MG) BY MOUTH TWICE DAILY Camnitz, Will Daphine Deutscher, MD Taking Active   Ascorbic Acid (VITAMIN C) 1000 MG tablet [id number] No Take 1,000 mg by mouth daily. [provider] Taking Active Self  Calcium Carbonate (CALCIUM 600 PO) [id number] No Take 600 mg by mouth daily. [provider] Taking Active Self  Cholecalciferol (VITAMIN D) 2000 UNITS CAPS [id number] No Take 2,000 Units by mouth daily. [provider] Taking Active Self  ferrous gluconate (FERGON) 324 MG tablet [id number]  Take 1 tablet (324 mg total) by mouth daily. Zola Button, Grayling Congress, DO  Active   furosemide (LASIX) 40 MG tablet [id number] No Take 1 tablet (40 mg total) by mouth daily. Donato Schultz, DO Taking Active   hydrOXYzine (VISTARIL) 25 MG capsule [id number] No Take 1 capsule (25 mg total) by mouth every 8 (eight) hours as needed for anxiety. Donato Schultz, DO Taking Active   mupirocin ointment (BACTROBAN) 2 % [id number] No Apply 1 Application topically 2 (two) times daily. Saguier, Ramon Dredge, PA-C Taking Active   oxyCODONE-acetaminophen (PERCOCET/ROXICET) 5-325 MG tablet [id number]  Take 1 tablet by mouth every 6 (six) hours as needed for severe pain (pain score 7-10). Terrilee Files, MD  Active   pantoprazole (PROTONIX) 40 MG tablet [id number] No Take 1 tablet (40 mg total) by mouth daily. Albertine Grates, MD Taking Active   Polyethyl Glycol-Propyl Glycol (SYSTANE ULTRA OP) [id number] No Place 1 drop into both eyes daily as needed (for dry eyes). [provider] Taking Active Self  polyethylene glycol (MIRALAX / GLYCOLAX) 17 g packet [id number] No Take 17 g by mouth 2 (two) times daily. Albertine Grates, MD Taking Active   pregabalin (LYRICA) 50 MG capsule [id number] No TAKE 1 CAPSULE(50 MG) BY MOUTH AT BEDTIME Donato Schultz, DO Taking Active   thiamine 100 MG tablet [id number] No Take 1 tablet (100 mg total) by mouth daily. Albertine Grates, MD Taking Active   vitamin B-12 (CYANOCOBALAMIN) 1000 MCG tablet [id number] No Take 1 tablet (1,000 mcg total) by mouth daily. Albertine Grates, MD Taking Active  Home Care and Equipment/Supplies: Were Home Health Services Ordered?: NA Any new equipment or medical supplies ordered?: NA  Functional Questionnaire: Do you need assistance with bathing/showering or dressing?: Yes Do you need assistance with meal preparation?: Yes Do you need assistance with eating?: No Do  you have difficulty maintaining continence: No Do you need assistance with getting out of bed/getting out of a chair/moving?: Yes Do you have difficulty managing or taking your medications?: Yes  Follow up appointments reviewed: PCP Follow-up appointment confirmed?: NA Specialist Hospital Follow-up appointment confirmed?: No Reason Specialist Follow-Up Not Confirmed: Patient has Specialist Provider Number and will Call for Appointment (husand has number to [facility] and is calling to get appt) Do you need transportation to your follow-up appointment?: No Do you understand care options if your condition(s) worsen?: Yes-patient verbalized understanding     Tyrann Donaho, CMA  [facility] AWV Team Direct Dial: [phone]

## 2023-03-16 ENCOUNTER — Ambulatory Visit: Payer: Medicare Other | Admitting: Pulmonary Disease

## 2023-03-17 ENCOUNTER — Other Ambulatory Visit: Payer: Self-pay

## 2023-03-17 ENCOUNTER — Inpatient Hospital Stay (HOSPITAL_COMMUNITY)
Admission: EM | Admit: 2023-03-17 | Discharge: 2023-03-24 | DRG: 070 | Disposition: A | Discharge status: Hospice | Payer: Medicare Other | Attending: Family Medicine | Admitting: Family Medicine

## 2023-03-17 ENCOUNTER — Encounter (HOSPITAL_COMMUNITY): Payer: Self-pay

## 2023-03-17 ENCOUNTER — Emergency Department (HOSPITAL_COMMUNITY): Payer: Medicare Other

## 2023-03-17 DIAGNOSIS — L03115 Cellulitis of right lower limb: Secondary | ICD-10-CM | POA: Diagnosis present

## 2023-03-17 DIAGNOSIS — M545 Low back pain, unspecified: Secondary | ICD-10-CM | POA: Diagnosis not present

## 2023-03-17 DIAGNOSIS — L03119 Cellulitis of unspecified part of limb: Secondary | ICD-10-CM | POA: Diagnosis present

## 2023-03-17 DIAGNOSIS — Z1152 Encounter for screening for COVID-19: Secondary | ICD-10-CM

## 2023-03-17 DIAGNOSIS — J9811 Atelectasis: Secondary | ICD-10-CM | POA: Diagnosis not present

## 2023-03-17 DIAGNOSIS — E785 Hyperlipidemia, unspecified: Secondary | ICD-10-CM | POA: Diagnosis present

## 2023-03-17 DIAGNOSIS — Z66 Do not resuscitate: Secondary | ICD-10-CM | POA: Diagnosis present

## 2023-03-17 DIAGNOSIS — Z79899 Other long term (current) drug therapy: Secondary | ICD-10-CM | POA: Diagnosis not present

## 2023-03-17 DIAGNOSIS — A419 Sepsis, unspecified organism: Secondary | ICD-10-CM | POA: Diagnosis not present

## 2023-03-17 DIAGNOSIS — S32000D Wedge compression fracture of unspecified lumbar vertebra, subsequent encounter for fracture with routine healing: Secondary | ICD-10-CM | POA: Diagnosis not present

## 2023-03-17 DIAGNOSIS — I5031 Acute diastolic (congestive) heart failure: Secondary | ICD-10-CM | POA: Diagnosis not present

## 2023-03-17 DIAGNOSIS — E538 Deficiency of other specified B group vitamins: Secondary | ICD-10-CM | POA: Diagnosis present

## 2023-03-17 DIAGNOSIS — I083 Combined rheumatic disorders of mitral, aortic and tricuspid valves: Secondary | ICD-10-CM | POA: Diagnosis not present

## 2023-03-17 DIAGNOSIS — M4856XA Collapsed vertebra, not elsewhere classified, lumbar region, initial encounter for fracture: Secondary | ICD-10-CM | POA: Diagnosis present

## 2023-03-17 DIAGNOSIS — L89312 Pressure ulcer of right buttock, stage 2: Secondary | ICD-10-CM | POA: Diagnosis present

## 2023-03-17 DIAGNOSIS — N184 Chronic kidney disease, stage 4 (severe): Secondary | ICD-10-CM | POA: Diagnosis not present

## 2023-03-17 DIAGNOSIS — E782 Mixed hyperlipidemia: Secondary | ICD-10-CM | POA: Diagnosis present

## 2023-03-17 DIAGNOSIS — Z7901 Long term (current) use of anticoagulants: Secondary | ICD-10-CM

## 2023-03-17 DIAGNOSIS — Z515 Encounter for palliative care: Secondary | ICD-10-CM

## 2023-03-17 DIAGNOSIS — L89101 Pressure ulcer of unspecified part of back, stage 1: Secondary | ICD-10-CM | POA: Diagnosis not present

## 2023-03-17 DIAGNOSIS — G8929 Other chronic pain: Secondary | ICD-10-CM | POA: Diagnosis not present

## 2023-03-17 DIAGNOSIS — R4589 Other symptoms and signs involving emotional state: Secondary | ICD-10-CM | POA: Diagnosis not present

## 2023-03-17 DIAGNOSIS — I77810 Thoracic aortic ectasia: Secondary | ICD-10-CM | POA: Diagnosis not present

## 2023-03-17 DIAGNOSIS — G629 Polyneuropathy, unspecified: Secondary | ICD-10-CM | POA: Diagnosis present

## 2023-03-17 DIAGNOSIS — G9340 Encephalopathy, unspecified: Secondary | ICD-10-CM

## 2023-03-17 DIAGNOSIS — I5041 Acute combined systolic (congestive) and diastolic (congestive) heart failure: Secondary | ICD-10-CM | POA: Diagnosis not present

## 2023-03-17 DIAGNOSIS — F419 Anxiety disorder, unspecified: Secondary | ICD-10-CM | POA: Diagnosis present

## 2023-03-17 DIAGNOSIS — Z743 Need for continuous supervision: Secondary | ICD-10-CM | POA: Diagnosis not present

## 2023-03-17 DIAGNOSIS — R1310 Dysphagia, unspecified: Secondary | ICD-10-CM | POA: Diagnosis not present

## 2023-03-17 DIAGNOSIS — I10 Essential (primary) hypertension: Secondary | ICD-10-CM | POA: Diagnosis present

## 2023-03-17 DIAGNOSIS — Z86711 Personal history of pulmonary embolism: Secondary | ICD-10-CM

## 2023-03-17 DIAGNOSIS — M4854XA Collapsed vertebra, not elsewhere classified, thoracic region, initial encounter for fracture: Secondary | ICD-10-CM | POA: Diagnosis not present

## 2023-03-17 DIAGNOSIS — G9341 Metabolic encephalopathy: Principal | ICD-10-CM

## 2023-03-17 DIAGNOSIS — I5021 Acute systolic (congestive) heart failure: Secondary | ICD-10-CM | POA: Diagnosis not present

## 2023-03-17 DIAGNOSIS — R6520 Severe sepsis without septic shock: Secondary | ICD-10-CM | POA: Diagnosis not present

## 2023-03-17 DIAGNOSIS — R0689 Other abnormalities of breathing: Secondary | ICD-10-CM | POA: Diagnosis not present

## 2023-03-17 DIAGNOSIS — Z7189 Other specified counseling: Secondary | ICD-10-CM | POA: Diagnosis not present

## 2023-03-17 DIAGNOSIS — Z8249 Family history of ischemic heart disease and other diseases of the circulatory system: Secondary | ICD-10-CM

## 2023-03-17 DIAGNOSIS — I5032 Chronic diastolic (congestive) heart failure: Secondary | ICD-10-CM | POA: Diagnosis present

## 2023-03-17 DIAGNOSIS — I480 Paroxysmal atrial fibrillation: Secondary | ICD-10-CM | POA: Diagnosis present

## 2023-03-17 DIAGNOSIS — S32000A Wedge compression fracture of unspecified lumbar vertebra, initial encounter for closed fracture: Secondary | ICD-10-CM | POA: Diagnosis present

## 2023-03-17 DIAGNOSIS — K219 Gastro-esophageal reflux disease without esophagitis: Secondary | ICD-10-CM | POA: Diagnosis present

## 2023-03-17 DIAGNOSIS — R404 Transient alteration of awareness: Secondary | ICD-10-CM | POA: Diagnosis not present

## 2023-03-17 DIAGNOSIS — K5900 Constipation, unspecified: Secondary | ICD-10-CM | POA: Diagnosis not present

## 2023-03-17 DIAGNOSIS — I509 Heart failure, unspecified: Principal | ICD-10-CM

## 2023-03-17 DIAGNOSIS — Z7401 Bed confinement status: Secondary | ICD-10-CM | POA: Diagnosis not present

## 2023-03-17 DIAGNOSIS — R531 Weakness: Secondary | ICD-10-CM | POA: Diagnosis not present

## 2023-03-17 DIAGNOSIS — Z885 Allergy status to narcotic agent status: Secondary | ICD-10-CM

## 2023-03-17 DIAGNOSIS — R451 Restlessness and agitation: Secondary | ICD-10-CM | POA: Diagnosis not present

## 2023-03-17 DIAGNOSIS — I110 Hypertensive heart disease with heart failure: Secondary | ICD-10-CM | POA: Diagnosis not present

## 2023-03-17 DIAGNOSIS — L8990 Pressure ulcer of unspecified site, unspecified stage: Secondary | ICD-10-CM | POA: Insufficient documentation

## 2023-03-17 DIAGNOSIS — Z888 Allergy status to other drugs, medicaments and biological substances status: Secondary | ICD-10-CM

## 2023-03-17 DIAGNOSIS — I517 Cardiomegaly: Secondary | ICD-10-CM | POA: Diagnosis not present

## 2023-03-17 DIAGNOSIS — Z96641 Presence of right artificial hip joint: Secondary | ICD-10-CM | POA: Diagnosis present

## 2023-03-17 DIAGNOSIS — I130 Hypertensive heart and chronic kidney disease with heart failure and stage 1 through stage 4 chronic kidney disease, or unspecified chronic kidney disease: Secondary | ICD-10-CM | POA: Diagnosis present

## 2023-03-17 DIAGNOSIS — R918 Other nonspecific abnormal finding of lung field: Secondary | ICD-10-CM | POA: Diagnosis not present

## 2023-03-17 DIAGNOSIS — T402X5A Adverse effect of other opioids, initial encounter: Secondary | ICD-10-CM | POA: Diagnosis not present

## 2023-03-17 DIAGNOSIS — R29810 Facial weakness: Secondary | ICD-10-CM | POA: Diagnosis not present

## 2023-03-17 DIAGNOSIS — Z841 Family history of disorders of kidney and ureter: Secondary | ICD-10-CM

## 2023-03-17 DIAGNOSIS — R0989 Other specified symptoms and signs involving the circulatory and respiratory systems: Secondary | ICD-10-CM | POA: Diagnosis not present

## 2023-03-17 DIAGNOSIS — I351 Nonrheumatic aortic (valve) insufficiency: Secondary | ICD-10-CM | POA: Diagnosis present

## 2023-03-17 DIAGNOSIS — R6889 Other general symptoms and signs: Secondary | ICD-10-CM | POA: Diagnosis not present

## 2023-03-17 DIAGNOSIS — K5903 Drug induced constipation: Secondary | ICD-10-CM | POA: Diagnosis not present

## 2023-03-17 DIAGNOSIS — I499 Cardiac arrhythmia, unspecified: Secondary | ICD-10-CM | POA: Diagnosis not present

## 2023-03-17 LAB — URINALYSIS, W/ REFLEX TO CULTURE (INFECTION SUSPECTED)
Bacteria, UA: NONE SEEN
Bilirubin Urine: NEGATIVE
Glucose, UA: NEGATIVE mg/dL
Hgb urine dipstick: NEGATIVE
Ketones, ur: NEGATIVE mg/dL
Leukocytes,Ua: NEGATIVE
Nitrite: NEGATIVE
Protein, ur: NEGATIVE mg/dL
Specific Gravity, Urine: 1.015 (ref 1.005–1.030)
pH: 5 (ref 5.0–8.0)

## 2023-03-17 LAB — CBC WITH DIFFERENTIAL/PLATELET
Abs Immature Granulocytes: 0.04 10*3/uL (ref 0.00–0.07)
Basophils Absolute: 0 10*3/uL (ref 0.0–0.1)
Basophils Relative: 1 %
Eosinophils Absolute: 0.2 10*3/uL (ref 0.0–0.5)
Eosinophils Relative: 3 %
HCT: 40.5 % (ref 36.0–46.0)
Hemoglobin: 13.2 g/dL (ref 12.0–15.0)
Immature Granulocytes: 1 %
Lymphocytes Relative: 22 %
Lymphs Abs: 1.4 10*3/uL (ref 0.7–4.0)
MCH: 30.8 pg (ref 26.0–34.0)
MCHC: 32.6 g/dL (ref 30.0–36.0)
MCV: 94.4 fL (ref 80.0–100.0)
Monocytes Absolute: 0.6 10*3/uL (ref 0.1–1.0)
Monocytes Relative: 9 %
Neutro Abs: 4.3 10*3/uL (ref 1.7–7.7)
Neutrophils Relative %: 64 %
Platelets: 324 10*3/uL (ref 150–400)
RBC: 4.29 MIL/uL (ref 3.87–5.11)
RDW: 16.2 % — ABNORMAL HIGH (ref 11.5–15.5)
WBC: 6.6 10*3/uL (ref 4.0–10.5)
nRBC: 0 % (ref 0.0–0.2)

## 2023-03-17 LAB — COMPREHENSIVE METABOLIC PANEL
ALT: 20 U/L (ref 0–44)
AST: 16 U/L (ref 15–41)
Albumin: 3.7 g/dL (ref 3.5–5.0)
Alkaline Phosphatase: 81 U/L (ref 38–126)
Anion gap: 10 (ref 5–15)
BUN: 35 mg/dL — ABNORMAL HIGH (ref 8–23)
CO2: 23 mmol/L (ref 22–32)
Calcium: 9.2 mg/dL (ref 8.9–10.3)
Chloride: 104 mmol/L (ref 98–111)
Creatinine, Ser: 2.21 mg/dL — ABNORMAL HIGH (ref 0.44–1.00)
GFR, Estimated: 21 mL/min — ABNORMAL LOW (ref >60)
Glucose, Bld: 91 mg/dL (ref 70–99)
Potassium: 4 mmol/L (ref 3.5–5.1)
Sodium: 137 mmol/L (ref 135–145)
Total Bilirubin: 1 mg/dL (ref <1.2)
Total Protein: 7.5 g/dL (ref 6.5–8.1)

## 2023-03-17 LAB — RESP PANEL BY RT-PCR (RSV, FLU A&B, COVID)  RVPGX2
Influenza A by PCR: NEGATIVE
Influenza B by PCR: NEGATIVE
Resp Syncytial Virus by PCR: NEGATIVE
SARS Coronavirus 2 by RT PCR: NEGATIVE

## 2023-03-17 LAB — BRAIN NATRIURETIC PEPTIDE: B Natriuretic Peptide: 1035.9 pg/mL — ABNORMAL HIGH (ref 0.0–100.0)

## 2023-03-17 LAB — APTT: aPTT: 43 s — ABNORMAL HIGH (ref 24–36)

## 2023-03-17 LAB — PROTIME-INR
INR: 2 — ABNORMAL HIGH (ref 0.8–1.2)
Prothrombin Time: 22.6 s — ABNORMAL HIGH (ref 11.4–15.2)

## 2023-03-17 LAB — I-STAT CG4 LACTIC ACID, ED: Lactic Acid, Venous: 1.1 mmol/L (ref 0.5–1.9)

## 2023-03-17 MED ORDER — IPRATROPIUM-ALBUTEROL 0.5-2.5 (3) MG/3ML IN SOLN
3.0000 mL | Freq: Three times a day (TID) | RESPIRATORY_TRACT | Status: DC
  Administered 2023-03-17: 3 mL via RESPIRATORY_TRACT
  Filled 2023-03-17: qty 3

## 2023-03-17 MED ORDER — OXYCODONE-ACETAMINOPHEN 5-325 MG PO TABS
1.0000 | ORAL_TABLET | Freq: Once | ORAL | Status: AC
Start: 2023-03-17 — End: 2023-03-17
  Administered 2023-03-17: 1 via ORAL
  Filled 2023-03-17: qty 1

## 2023-03-17 MED ORDER — POLYETHYLENE GLYCOL 3350 17 G PO PACK
17.0000 g | PACK | Freq: Every day | ORAL | Status: DC | PRN
  Administered 2023-03-21: 17 g via ORAL
  Filled 2023-03-17: qty 1

## 2023-03-17 MED ORDER — ALBUTEROL SULFATE (2.5 MG/3ML) 0.083% IN NEBU: 2.5000 mg | INHALATION_SOLUTION | RESPIRATORY_TRACT | Status: DC | PRN

## 2023-03-17 MED ORDER — OXYCODONE-ACETAMINOPHEN 5-325 MG PO TABS
1.0000 | ORAL_TABLET | Freq: Four times a day (QID) | ORAL | Status: DC | PRN
  Administered 2023-03-17 – 2023-03-18 (×3): 1 via ORAL
  Filled 2023-03-17 (×3): qty 1

## 2023-03-17 MED ORDER — FUROSEMIDE 40 MG PO TABS
40.0000 mg | ORAL_TABLET | Freq: Every day | ORAL | Status: DC
  Administered 2023-03-18: 40 mg via ORAL
  Filled 2023-03-17: qty 1

## 2023-03-17 MED ORDER — VITAMIN C 500 MG PO TABS
1000.0000 mg | ORAL_TABLET | Freq: Every day | ORAL | Status: DC
Start: 2023-03-18 — End: 2023-03-23
  Administered 2023-03-18 – 2023-03-23 (×6): 1000 mg via ORAL
  Filled 2023-03-17 (×6): qty 2

## 2023-03-17 MED ORDER — LACTATED RINGERS IV SOLN: INTRAVENOUS | Status: DC

## 2023-03-17 MED ORDER — VITAMIN B-12 1000 MCG PO TABS
1000.0000 ug | ORAL_TABLET | Freq: Every day | ORAL | Status: DC
  Administered 2023-03-18: 1000 ug via ORAL
  Filled 2023-03-17: qty 1

## 2023-03-17 MED ORDER — PREGABALIN 50 MG PO CAPS
50.0000 mg | ORAL_CAPSULE | Freq: Every day | ORAL | Status: DC
  Administered 2023-03-17 – 2023-03-21 (×5): 50 mg via ORAL
  Filled 2023-03-17 (×6): qty 1

## 2023-03-17 MED ORDER — APIXABAN 2.5 MG PO TABS
2.5000 mg | ORAL_TABLET | Freq: Two times a day (BID) | ORAL | Status: DC
  Administered 2023-03-17 – 2023-03-23 (×11): 2.5 mg via ORAL
  Filled 2023-03-17 (×12): qty 1

## 2023-03-17 MED ORDER — SODIUM CHLORIDE 0.9 % IV SOLN
2.0000 g | INTRAVENOUS | Status: AC
  Administered 2023-03-17 – 2023-03-23 (×7): 2 g via INTRAVENOUS
  Filled 2023-03-17 (×7): qty 20

## 2023-03-17 NOTE — Sepsis Progress Note (Signed)
Elink monitoring for the code sepsis protocol.  

## 2023-03-17 NOTE — ED Notes (Signed)
ED TO INPATIENT HANDOFF REPORT  ED Nurse Name and Phone #:   S Name/Age/Gender Marlis Edelson 89 y.o. female Room/Bed: WA21/WA21  Code Status   Code Status: Limited: Do not attempt resuscitation (DNR) -DNR-LIMITED -Do Not Intubate/DNI   Home/SNF/Other Home Patient oriented to: self, place, time, and situation Is this baseline? Yes   Triage Complete: Triage complete  Chief Complaint Acute metabolic encephalopathy [G93.41]  Triage Note No notes on file   Allergies Allergies  Allergen Reactions   Codeine Nausea Only   Duloxetine Other (See Comments)    REACTION: intolerance--She does not remember taking this Rx- states she did not feel well    Sertraline Hcl Other (See Comments)    REACTION: intolerance-- Did not help with Depression    Level of Care/Admitting Diagnosis ED Disposition     ED Disposition  Admit   Condition  --   Comment  Hospital Area: [facility] [[id number]]  Level of Care: Progressive [102]  Admit to Progressive based on following criteria: MULTISYSTEM THREATS such as stable sepsis, metabolic/electrolyte imbalance with or without encephalopathy that is responding to early treatment.  May admit patient to [facility] or [facility] if equivalent level of care is available:: No  Covid Evaluation: Asymptomatic - no recent exposure (last 10 days) testing not required  Diagnosis: Acute metabolic encephalopathy [0865784]  Admitting Physician: Bobette Mo [[id number]]  Attending Physician: Bobette Mo [[id number]]  Certification:: I certify this patient will need inpatient services for at least 2 midnights  Expected Medical Readiness: 03/19/2023          B Medical/Surgery History Past Medical History:  Diagnosis Date   Anemia    hx of    Angiodysplasia 2007   @ colonoscopy   Anxiety    PMH of   Chronic low back pain 06/21/2014   Diverticulosis of colon (without mention of hemorrhage)    DJD (degenerative joint  disease)    Esophageal reflux    inactive   History of vertebral fracture 11/2015   HTN (hypertension)    Hyperlipemia 2006   LDL 130   Hypoglycemia    reactive   Pelvic fracture (HCC) 12/30/2011   [facility] [facility]   Peripheral neuropathy    Personal history of colonic polyps    adenomatous   Vitamin B12 deficiency    Past Surgical History:  Procedure Laterality Date   cataract surgery  12-26-12 and 01-09-13   COLONOSCOPY  2011   neg   COLONOSCOPY W/ POLYPECTOMY     X 2 , Dr  Sheryn Bison; angiodysplasia. Due 2022   DILATION AND CURETTAGE OF UTERUS     FACIAL COSMETIC SURGERY     HEMORROIDECTOMY     LUMBAR LAMINECTOMY/DECOMPRESSION MICRODISCECTOMY  09/10/2011   Procedure: LUMBAR LAMINECTOMY/DECOMPRESSION MICRODISCECTOMY;  Surgeon: Javier Docker, MD;  Location: [facility];  Service: Orthopedics;  Laterality: N/A;  decompression laminectomy L2-3, L3-4, L4-5   ORIF WRIST FRACTURE  01/11/2012   Procedure: OPEN REDUCTION INTERNAL FIXATION (ORIF) WRIST FRACTURE;  Surgeon: Tami Ribas, MD;  Location: [facility];  Service: Orthopedics;  Laterality: Right;   TEAR DUCT PROBING     X 2   TOTAL HIP ARTHROPLASTY  2000   right   TUBAL LIGATION       A IV Location/Drains/Wounds Patient Lines/Drains/Airways Status     Active Line/Drains/Airways     Name Placement date Placement time Site Days   Peripheral IV 03/17/23 20 G 1" Right  Antecubital 03/17/23  1230  Antecubital  less than 1   Wound / Incision (Open or Dehisced) 07/23/21 Non-pressure wound Pretibial Right 2x1.3 07/23/21  1525  Pretibial  602            Intake/Output Last 24 hours  Intake/Output Summary (Last 24 hours) at 03/17/2023 1910 Last data filed at 03/17/2023 1344 Gross per 24 hour  Intake 100 ml  Output --  Net 100 ml    Labs/Imaging Results for orders placed or performed during the hospital encounter of 03/17/23 (from the past 48 hours)  Comprehensive metabolic panel     Status: Abnormal    Collection Time: 03/17/23 12:20 PM  Result Value Ref Range   Sodium 137 135 - 145 mmol/L   Potassium 4.0 3.5 - 5.1 mmol/L   Chloride 104 98 - 111 mmol/L   CO2 23 22 - 32 mmol/L   Glucose, Bld 91 70 - 99 mg/dL    Comment: Glucose reference range applies only to samples taken after fasting for at least 8 hours.   BUN 35 (H) 8 - 23 mg/dL   Creatinine, Ser 1.61 (H) 0.44 - 1.00 mg/dL   Calcium 9.2 8.9 - 09.6 mg/dL   Total Protein 7.5 6.5 - 8.1 g/dL   Albumin 3.7 3.5 - 5.0 g/dL   AST 16 15 - 41 U/L   ALT 20 0 - 44 U/L   Alkaline Phosphatase 81 38 - 126 U/L   Total Bilirubin 1.0 <1.2 mg/dL   GFR, Estimated 21 (L) >60 mL/min    Comment: (NOTE) Calculated using the CKD-EPI Creatinine Equation (2021)    Anion gap 10 5 - 15    Comment: Performed at [facility], [address] [place], [place] [address]  CBC with Differential     Status: Abnormal   Collection Time: 03/17/23 12:20 PM  Result Value Ref Range   WBC 6.6 4.0 - 10.5 K/uL   RBC 4.29 3.87 - 5.11 MIL/uL   Hemoglobin 13.2 12.0 - 15.0 g/dL   HCT 98.1 19.1 - 47.8 %   MCV 94.4 80.0 - 100.0 fL   MCH 30.8 26.0 - 34.0 pg   MCHC 32.6 30.0 - 36.0 g/dL   RDW 29.5 (H) 62.1 - 30.8 %   Platelets 324 150 - 400 K/uL   nRBC 0.0 0.0 - 0.2 %   Neutrophils Relative % 64 %   Neutro Abs 4.3 1.7 - 7.7 K/uL   Lymphocytes Relative 22 %   Lymphs Abs 1.4 0.7 - 4.0 K/uL   Monocytes Relative 9 %   Monocytes Absolute 0.6 0.1 - 1.0 K/uL   Eosinophils Relative 3 %   Eosinophils Absolute 0.2 0.0 - 0.5 K/uL   Basophils Relative 1 %   Basophils Absolute 0.0 0.0 - 0.1 K/uL   Immature Granulocytes 1 %   Abs Immature Granulocytes 0.04 0.00 - 0.07 K/uL    Comment: Performed at [facility], [address] [place], [place] [address]  Protime-INR     Status: Abnormal   Collection Time: 03/17/23 12:20 PM  Result Value Ref Range   Prothrombin Time 22.6 (H) 11.4 - 15.2 seconds   INR 2.0 (H) 0.8 - 1.2    Comment:  (NOTE) INR goal varies based on device and disease states. Performed at [facility], [address] [place], [place] [address]   APTT     Status: Abnormal   Collection Time: 03/17/23 12:20 PM  Result Value Ref  Range   aPTT 43 (H) 24 - 36 seconds    Comment:        IF BASELINE aPTT IS ELEVATED, SUGGEST PATIENT RISK ASSESSMENT BE USED TO DETERMINE APPROPRIATE ANTICOAGULANT THERAPY. Performed at [facility], [address], [place], [place] [address]   Brain natriuretic peptide     Status: Abnormal   Collection Time: 03/17/23 12:20 PM  Result Value Ref Range   B Natriuretic Peptide 1,035.9 (H) 0.0 - 100.0 pg/mL    Comment: Performed at [facility], [address], [place], [place] [address]  Urinalysis, w/ Reflex to Culture (Infection Suspected) -Urine, Clean Catch     Status: None   Collection Time: 03/17/23 12:20 PM  Result Value Ref Range   Specimen Source URINE, CLEAN CATCH    Color, Urine YELLOW YELLOW   APPearance CLEAR CLEAR   Specific Gravity, Urine 1.015 1.005 - 1.030   pH 5.0 5.0 - 8.0   Glucose, UA NEGATIVE NEGATIVE mg/dL   Hgb urine dipstick NEGATIVE NEGATIVE   Bilirubin Urine NEGATIVE NEGATIVE   Ketones, ur NEGATIVE NEGATIVE mg/dL   Protein, ur NEGATIVE NEGATIVE mg/dL   Nitrite NEGATIVE NEGATIVE   Leukocytes,Ua NEGATIVE NEGATIVE   RBC / HPF 0-5 0 - 5 RBC/hpf   WBC, UA 0-5 0 - 5 WBC/hpf    Comment:        Reflex urine culture not performed if WBC <=10, OR if Squamous epithelial cells >5. If Squamous epithelial cells >5 suggest recollection.    Bacteria, UA NONE SEEN NONE SEEN   Squamous Epithelial / HPF 0-5 0 - 5 /HPF   Mucus PRESENT    Hyaline Casts, UA PRESENT     Comment: Performed at [facility], [address], [place], [place] [address]  Resp panel by RT-PCR (RSV, Flu A&B, Covid) Anterior Nasal Swab     Status: None   Collection Time: 03/17/23 12:22 PM   Specimen:  Anterior Nasal Swab  Result Value Ref Range   SARS Coronavirus 2 by RT PCR NEGATIVE NEGATIVE    Comment: (NOTE) SARS-CoV-2 target nucleic acids are NOT DETECTED.  The SARS-CoV-2 RNA is generally detectable in upper respiratory specimens during the acute phase of infection. The lowest concentration of SARS-CoV-2 viral copies this assay can detect is 138 copies/mL. A negative result does not preclude SARS-Cov-2 infection and should not be used as the sole basis for treatment or other patient management decisions. A negative result may occur with  improper specimen collection/handling, submission of specimen other than nasopharyngeal swab, presence of viral mutation(s) within the areas targeted by this assay, and inadequate number of viral copies(<138 copies/mL). A negative result must be combined with clinical observations, patient history, and epidemiological information. The expected result is Negative.  Fact Sheet for Patients:  BloggerCourse.com  Fact Sheet for Healthcare Providers:  SeriousBroker.it  This test is no t yet approved or cleared by the [place] FDA and  has been authorized for detection and/or diagnosis of SARS-CoV-2 by FDA under an Emergency Use Authorization (EUA). This EUA will remain  in effect (meaning this test can be used) for the duration of the COVID-19 declaration under Section 564(b)(1) of the Act, 21 U.S.C.section 360bbb-3(b)(1), unless the authorization is terminated  or revoked sooner.       Influenza A by PCR NEGATIVE NEGATIVE   Influenza B by PCR NEGATIVE NEGATIVE    Comment: (NOTE) The Xpert Xpress SARS-CoV-2/FLU/RSV plus assay is intended as an aid  in the diagnosis of influenza from Nasopharyngeal swab specimens and should not be used as a sole basis for treatment. Nasal washings and aspirates are unacceptable for Xpert Xpress SARS-CoV-2/FLU/RSV testing.  Fact Sheet for  Patients: BloggerCourse.com  Fact Sheet for Healthcare Providers: SeriousBroker.it  This test is not yet approved or cleared by the [place] FDA and has been authorized for detection and/or diagnosis of SARS-CoV-2 by FDA under an Emergency Use Authorization (EUA). This EUA will remain in effect (meaning this test can be used) for the duration of the COVID-19 declaration under Section 564(b)(1) of the Act, 21 U.S.C. section 360bbb-3(b)(1), unless the authorization is terminated or revoked.     Resp Syncytial Virus by PCR NEGATIVE NEGATIVE    Comment: (NOTE) Fact Sheet for Patients: BloggerCourse.com  Fact Sheet for Healthcare Providers: SeriousBroker.it  This test is not yet approved or cleared by the [place] FDA and has been authorized for detection and/or diagnosis of SARS-CoV-2 by FDA under an Emergency Use Authorization (EUA). This EUA will remain in effect (meaning this test can be used) for the duration of the COVID-19 declaration under Section 564(b)(1) of the Act, 21 U.S.C. section 360bbb-3(b)(1), unless the authorization is terminated or revoked.  Performed at [facility], [address], [place], [place] [address]   I-Stat Lactic Acid, ED     Status: None   Collection Time: 03/17/23  2:37 PM  Result Value Ref Range   Lactic Acid, Venous 1.1 0.5 - 1.9 mmol/L   CT Head Wo Contrast Result Date: 03/17/2023 CLINICAL DATA:  Altered mental status. EXAM: CT HEAD WITHOUT CONTRAST TECHNIQUE: Contiguous axial images were obtained from the base of the skull through the vertex without intravenous contrast. RADIATION DOSE REDUCTION: This exam was performed according to the departmental dose-optimization program which includes automated exposure control, adjustment of the mA and/or kV according to patient size and/or use of iterative reconstruction  technique. COMPARISON:  Head CT 07/22/2021. FINDINGS: Brain: No acute intracranial hemorrhage. Gray-white differentiation is preserved. No hydrocephalus or extra-axial collection. No mass effect or midline shift. Vascular: No hyperdense vessel or unexpected calcification. Skull: No calvarial fracture or suspicious bone lesion. Skull base is unremarkable. Sinuses/Orbits: No acute finding. Other: None. IMPRESSION: No acute intracranial abnormality. Electronically Signed   By: Orvan Falconer M.D.   On: 03/17/2023 14:10   DG Chest Port 1 View Result Date: 03/17/2023 CLINICAL DATA:  Questionable sepsis.  Evaluate for abnormality. EXAM: PORTABLE CHEST 1 VIEW COMPARISON:  Chest radiographs 02/04/2023, 07/26/2021; CT chest 09/04/2009 FINDINGS: Cardiac silhouette is again mildly enlarged for AP technique. Mediastinal contours are unchanged and grossly within normal limits. High-grade atherosclerotic calcifications within the aortic arch. Mild bilateral diffuse interstitial thickening is similar to prior. Left apical pleural chronic ossification is unchanged. Prominence of the central pulmonary vasculature is similar to prior. Mildly decreased lung volumes with likely mild bibasilar subsegmental atelectasis, similar to prior. No definite pleural effusion. No pneumothorax. Diffuse decreased bone mineralization. Moderate left and mild right glenohumeral joint space narrowing. IMPRESSION: 1. Mildly decreased lung volumes with likely mild bibasilar subsegmental atelectasis, similar to prior. No definite focal pneumonia. 2. Mild cardiomegaly and mild pulmonary vascular congestion, similar to prior. Electronically Signed   By: Neita Garnet M.D.   On: 03/17/2023 13:56    Pending Labs Unresulted Labs (From admission, onward)     Start     Ordered   03/18/23 0500  CBC  Tomorrow morning,   R  03/17/23 1451   03/18/23 0500  Comprehensive metabolic panel  Tomorrow morning,   R        03/17/23 1451   03/17/23 1222   Blood Culture (routine x 2)  (Septic presentation on arrival (screening labs, nursing and treatment orders for obvious sepsis))  BLOOD CULTURE X 2,   STAT      03/17/23 1223            Vitals/Pain Today's Vitals   03/17/23 1430 03/17/23 1500 03/17/23 1610 03/17/23 1700  BP: (!) 144/90 126/75 (!) 126/98 (!) 137/103  Pulse: (!) 33 98 92 (!) 103  Resp: (!) 24 14 (!) 9 16  Temp:   97.8 F (36.6 C)   TempSrc:      SpO2: 100% 100% 98% 100%  Weight:      Height:      PainSc:        Isolation Precautions No active isolations  Medications Medications  cefTRIAXone (ROCEPHIN) 2 g in sodium chloride 0.9 % 100 mL IVPB (0 g Intravenous Stopped 03/17/23 1344)  oxyCODONE-acetaminophen (PERCOCET/ROXICET) 5-325 MG per tablet 1 tablet (1 tablet Oral Given 03/17/23 1240)    Mobility walks     Focused Assessments    R Recommendations: See Admitting Provider Note  Report given to:   Additional Notes:

## 2023-03-17 NOTE — Sepsis Progress Note (Signed)
Notified bedside nurse of need to draw lactic acid.  

## 2023-03-17 NOTE — ED Provider Notes (Signed)
[place] EMERGENCY DEPARTMENT AT [facility] Provider Note   CSN: [id number] Arrival date & time: 03/17/23  1146     History  Chief Complaint  Patient presents with   Altered Mental Status    Krystal Henderson is a 89 y.o. female with history of recent T12 vertebral body fracture with 50% height loss history of hypertension, hyperglycemia, peripheral neuropathy, B12 deficiency, anemia, and anxiety, PE on Eliquis presented for altered mental status.  Patient unable to give history.  Patient's husband showed up and was able to provide some history and states that since being discharged patient's been walking around and mobile however this morning he had a sharp change in mental status.  Patient is only taken 1 oxycodone as it made her loopy and so they have only been using Tylenol.  Patient's husband states that she has been urinating all right and has been acting like her self up until this morning with no clear changes to why.  Patient's husband denies fevers, vomiting.  Patient's husband does note that she seems to be developing an ulcer on her right buttocks that is continuously bleeding.  Level 5 caveat: Altered mental status  Home Medications Prior to Admission medications   Medication Sig Start Date End Date Taking? Authorizing Provider  acetaminophen (TYLENOL) 325 MG tablet Take 2 tablets (650 mg total) by mouth every 6 (six) hours as needed for mild pain (or Fever >/= 101). 08/01/21  Yes Albertine Grates, MD  apixaban (ELIQUIS) 2.5 MG TABS tablet TAKE 1 TABLET(2.5 MG) BY MOUTH TWICE DAILY 07/19/22  Yes Camnitz, Andree Coss, MD  Ascorbic Acid (VITAMIN C) 1000 MG tablet Take 1,000 mg by mouth daily.   Yes [provider]  Calcium Carbonate (CALCIUM 600 PO) Take 600 mg by mouth daily.   Yes [provider]  Cholecalciferol (VITAMIN D) 2000 UNITS CAPS Take 2,000 Units by mouth daily.   Yes [provider]  ferrous gluconate (FERGON) 324 MG tablet Take 1  tablet (324 mg total) by mouth daily. 02/24/23  Yes Seabron Spates R, DO  furosemide (LASIX) 40 MG tablet Take 1 tablet (40 mg total) by mouth daily. 08/19/22  Yes Donato Schultz, DO  hydrOXYzine (VISTARIL) 25 MG capsule Take 1 capsule (25 mg total) by mouth every 8 (eight) hours as needed for anxiety. 08/19/22  Yes Seabron Spates R, DO  mupirocin ointment (BACTROBAN) 2 % Apply 1 Application topically 2 (two) times daily. 09/22/22  Yes Saguier, Ramon Dredge, PA-C  Polyethyl Glycol-Propyl Glycol (SYSTANE ULTRA OP) Place 1 drop into both eyes daily as needed (for dry eyes).   Yes [provider]  polyethylene glycol (MIRALAX / GLYCOLAX) 17 g packet Take 17 g by mouth 2 (two) times daily. Patient taking differently: Take 17 g by mouth daily as needed for moderate constipation. 08/01/21  Yes Albertine Grates, MD  pregabalin (LYRICA) 50 MG capsule TAKE 1 CAPSULE(50 MG) BY MOUTH AT BEDTIME 02/17/23  Yes Seabron Spates R, DO  vitamin B-12 (CYANOCOBALAMIN) 1000 MCG tablet Take 1 tablet (1,000 mcg total) by mouth daily. 08/01/21  Yes Albertine Grates, MD  oxyCODONE-acetaminophen (PERCOCET/ROXICET) 5-325 MG tablet Take 1 tablet by mouth every 6 (six) hours as needed for severe pain (pain score 7-10). Patient not taking: Reported on 03/17/2023 03/11/23   Terrilee Files, MD      Allergies    Codeine, Duloxetine, and Sertraline hcl    Review of Systems   Review of Systems  Physical Exam Updated Vital Signs BP (!) 144/90   Pulse (!) 33   Temp 98 F (36.7 C) (Rectal)   Resp (!) 24   Ht 5\' 5"  (1.651 m)   Wt 61 kg   SpO2 100%   BMI 22.38 kg/m  Physical Exam Constitutional:      Appearance: She is ill-appearing.     Comments: Clearly altered repeating that her back hurts  Eyes:     Extraocular Movements: Extraocular movements intact.     Conjunctiva/sclera: Conjunctivae normal.     Pupils: Pupils are equal, round, and reactive to light.  Cardiovascular:     Rate and Rhythm: Tachycardia  present. Rhythm irregular.     Pulses: Normal pulses.     Heart sounds: Normal heart sounds.  Pulmonary:     Effort: Pulmonary effort is normal. No respiratory distress.     Breath sounds: Normal breath sounds.  Abdominal:     Palpations: Abdomen is soft.     Tenderness: There is no abdominal tenderness. There is no guarding or rebound.  Musculoskeletal:     Comments: 2+ pitting edema bilaterally  Skin:    General: Skin is warm and dry.     Capillary Refill: Capillary refill takes less than 2 seconds.     Findings: Erythema (Bilateral lower extremities) present.     Comments: 2 cm grade 2 ulcer on the right buttocks does not actively hemorrhaging, no bony protrusions or adipose tissue present, no purulent discharge in the area, nonfluctuant  Neurological:     Mental Status: She is disoriented.     ED Results / Procedures / Treatments   Labs (all labs ordered are listed, but only abnormal results are displayed) Labs Reviewed  COMPREHENSIVE METABOLIC PANEL - Abnormal; Notable for the following components:      Result Value   BUN 35 (*)    Creatinine, Ser 2.21 (*)    GFR, Estimated 21 (*)    All other components within normal limits  CBC WITH DIFFERENTIAL/PLATELET - Abnormal; Notable for the following components:   RDW 16.2 (*)    All other components within normal limits  PROTIME-INR - Abnormal; Notable for the following components:   Prothrombin Time 22.6 (*)    INR 2.0 (*)    All other components within normal limits  APTT - Abnormal; Notable for the following components:   aPTT 43 (*)    All other components within normal limits  BRAIN NATRIURETIC PEPTIDE - Abnormal; Notable for the following components:   B Natriuretic Peptide 1,035.9 (*)    All other components within normal limits  RESP PANEL BY RT-PCR (RSV, FLU A&B, COVID)  RVPGX2  CULTURE, BLOOD (ROUTINE X 2)  CULTURE, BLOOD (ROUTINE X 2)  URINALYSIS, W/ REFLEX TO CULTURE (INFECTION SUSPECTED)  I-STAT CG4 LACTIC  ACID, ED    EKG None  Radiology CT Head Wo Contrast Result Date: 03/17/2023 CLINICAL DATA:  Altered mental status. EXAM: CT HEAD WITHOUT CONTRAST TECHNIQUE: Contiguous axial images were obtained from the base of the skull through the vertex without intravenous contrast. RADIATION DOSE REDUCTION: This exam was performed according to the departmental dose-optimization program which includes automated exposure control, adjustment of the mA and/or kV according to patient size and/or use of iterative reconstruction technique. COMPARISON:  Head CT 07/22/2021. FINDINGS: Brain: No acute intracranial hemorrhage. Gray-white differentiation is preserved. No hydrocephalus or extra-axial collection. No mass effect or midline shift. Vascular: No hyperdense vessel or unexpected calcification. Skull: No calvarial fracture or  suspicious bone lesion. Skull base is unremarkable. Sinuses/Orbits: No acute finding. Other: None. IMPRESSION: No acute intracranial abnormality. Electronically Signed   By: Orvan Falconer M.D.   On: 03/17/2023 14:10   DG Chest Port 1 View Result Date: 03/17/2023 CLINICAL DATA:  Questionable sepsis.  Evaluate for abnormality. EXAM: PORTABLE CHEST 1 VIEW COMPARISON:  Chest radiographs 02/04/2023, 07/26/2021; CT chest 09/04/2009 FINDINGS: Cardiac silhouette is again mildly enlarged for AP technique. Mediastinal contours are unchanged and grossly within normal limits. High-grade atherosclerotic calcifications within the aortic arch. Mild bilateral diffuse interstitial thickening is similar to prior. Left apical pleural chronic ossification is unchanged. Prominence of the central pulmonary vasculature is similar to prior. Mildly decreased lung volumes with likely mild bibasilar subsegmental atelectasis, similar to prior. No definite pleural effusion. No pneumothorax. Diffuse decreased bone mineralization. Moderate left and mild right glenohumeral joint space narrowing. IMPRESSION: 1. Mildly decreased  lung volumes with likely mild bibasilar subsegmental atelectasis, similar to prior. No definite focal pneumonia. 2. Mild cardiomegaly and mild pulmonary vascular congestion, similar to prior. Electronically Signed   By: Neita Garnet M.D.   On: 03/17/2023 13:56    Procedures .Critical Care  Performed by: Netta Corrigan, PA-C Authorized by: Netta Corrigan, PA-C   Critical care provider statement:    Critical care time (minutes):  40   Critical care time was exclusive of:  Separately billable procedures and treating other patients   Critical care was necessary to treat or prevent imminent or life-threatening deterioration of the following conditions:  Sepsis   Critical care was time spent personally by me on the following activities:  Blood draw for specimens, development of treatment plan with patient or surrogate, discussions with consultants, evaluation of patient's response to treatment, examination of patient, obtaining history from patient or surrogate, review of old charts, re-evaluation of patient's condition, pulse oximetry, ordering and review of radiographic studies, ordering and review of laboratory studies and ordering and performing treatments and interventions   I assumed direction of critical care for this patient from another provider in my specialty: no     Care discussed with: admitting provider       Medications Ordered in ED Medications  cefTRIAXone (ROCEPHIN) 2 g in sodium chloride 0.9 % 100 mL IVPB (0 g Intravenous Stopped 03/17/23 1344)  oxyCODONE-acetaminophen (PERCOCET/ROXICET) 5-325 MG per tablet 1 tablet (1 tablet Oral Given 03/17/23 1240)    ED Course/ Medical Decision Making/ A&P                                 Medical Decision Making Amount and/or Complexity of Data Reviewed Labs: ordered. Radiology: ordered. ECG/medicine tests: ordered.  Risk Prescription drug management.   Marlis Edelson 89 y.o. presented today for sepsis.  Working DDx that  I considered at this time includes, but not limited to, sepsis, bacteremia, UTI, pneumonia, meningitis/encephalitis, cellulitis, ACS, myocarditis, acidosis, dehydration, electrolyte abnormalities.  R/o DDx: bacteremia, UTI, pneumonia, meningitis/encephalitis, ACS, myocarditis, acidosis, dehydration, electrolyte abnormalities: These are considered less likely due to history of present illness, physical exam, labs/imaging findings.  Review of prior external notes: 03/11/2023 ED  Unique Tests and My Interpretation: CBC: Unremarkable CMP: Unremarkable Lactic acid: Negative Lipase: Unremarkable UA: Unremarkable Chest x-ray: No acute findings aPTT: Unremarkable similar to previous PT/INR: Unremarkable, similar to previous Blood cultures: Pending Respiratory panel: Negative EKG: A-fib 115 bpm, left anterior fascicular block, no ST elevations or depressions noted CT head  without contrast: No acute findings  Social Determinants of Health: none  Discussion with Independent Historian:  Husband  Discussion of Management of Tests:  Robb Matar, MD Hospitalist  Risk: High: Hospitalization  Risk Stratification Score: none  Staffed with Estell Harpin, MD  Plan: On exam patient was septic on arrival.  On exam patient was clearly altered repeating that her back hurts.  With the tech present patient was rolled and patient does have grade 2 ulcer on the right buttocks was not showing any concerning features.  Patient does have recent acute T12 fracture which is most likely worth the back pain is coming from.  Patient's husband showed up a little bit later and states that she has been walking around and doing well up until this morning in which there is a sharp change in mental status.  Given patient's altered mental status along with tachycardia in the room sepsis was initiated and patient was given Rocephin for possible cellulitis.  CT have also ordered to rule out any intracranial pathology however do suspect  patient's altered mental status is more infectious.  Patient is DNR with comfort measures going through her paperwork however it states that she will receive IV antibiotics but no fluids and so Rocephin was started without fluids at patient's and patient's husband's wishes.  Once we get labs back do anticipate admission to hospitalist for admission.  Patient's labs and imaging are reassuring at this time unsure of patient's acute encephalopathy however this could be infectious from the ulcer and suspected cellulitis.  Hospitalist consulted.  I spoke to the hospitalist and patient was accepted for admission.  Patient stable for admission.  This chart was dictated using voice recognition software.  Despite best efforts to proofread,  errors can occur which can change the documentation meaning.         Final Clinical Impression(s) / ED Diagnoses Final diagnoses:  Acute on chronic congestive heart failure, unspecified heart failure type (HCC)  Sepsis with acute organ dysfunction without septic shock, due to unspecified organism, unspecified organ dysfunction type (HCC)  Cellulitis of lower extremity, unspecified laterality  Pressure injury of right buttock, stage 2 (HCC)  Acute encephalopathy    Rx / DC Orders ED Discharge Orders     None         Remi Deter 03/17/23 1454    Bethann Berkshire, MD 03/21/23 1605

## 2023-03-17 NOTE — ED Notes (Signed)
Lactic send to lab with order

## 2023-03-17 NOTE — H&P (Signed)
History and Physical    Patient: Krystal Henderson UJW:[id number] DOB: 05/19/1932 DOA: 03/17/2023 DOS: the patient was seen and examined on 03/17/2023 PCP: Donato Schultz, DO  Patient coming from: Home  Chief Complaint:  Chief Complaint  Patient presents with   Altered Mental Status   HPI: Krystal Henderson is a 89 y.o. female with medical history significant of anemia, angiodysplasia of the colon, anxiety, chronic lower back pain, diverticulosis, colon polyps, degenerative joint disease, GERD, history of vertebral fracture, hypertension, hyperlipidemia, reactive hypoglycemia, pelvic fracture, peripheral neuropathy, vitamin B12 deficiency who is brought to the emergency department due to acute change in mental status since this morning.  She is only fully oriented to name, she knows she is in the hospital, but not completely aware of the situation, disoriented to time and date.  History is mostly taken from her husband.  She is able to asked simple questions.  She is complaining about back pain.  No chest or abdominal pain at the moment.  Lab work: Her urinalysis was normal.  CBC showed a white count of was 6.6, Monnin 13.2 g/dL platelets 562.  PT 22.6, INR 2.0 and PTT 43.  Coronavirus, influenza and RSV PCR was negative.  CMP showed a BUN of 35 and a creatinine of 2.25 mg/dL.  The rest of the CMP measurements were normal.  Lactic acid was 1.1 mmol/L.  BNP was 1035.9 pg/mL.  Imaging: Portable 1 view chest radiograph showed mildly decreased lung volumes with likely mild bibasilar subsegmental atelectasis, similar to prior.  No definite focal pneumonia.  Cardiomegaly and mild pulmonary vascular congestion, similar to prior.  CT head without contrast with no acute intracranial normalities.  ED course: Initial vital signs were temperature 98 F, pulse 114, respirations 26, BP 139/97 mmHg O2 sat 94% on room air.  The patient received 2 g of ceftriaxone IVPB and a tablet of Percocet 5/325 mg p.o.  x 1.   Review of Systems: As mentioned in the history of present illness. All other systems reviewed and are negative.  Past Medical History:  Diagnosis Date   Anemia    hx of    Angiodysplasia 2007   @ colonoscopy   Anxiety    PMH of   Chronic low back pain 06/21/2014   Diverticulosis of colon (without mention of hemorrhage)    DJD (degenerative joint disease)    Esophageal reflux    inactive   History of vertebral fracture 11/2015   HTN (hypertension)    Hyperlipemia 2006   LDL 130   Hypoglycemia    reactive   Pelvic fracture (HCC) 12/30/2011   [facility]   Peripheral neuropathy    Personal history of colonic polyps    adenomatous   Vitamin B12 deficiency    Past Surgical History:  Procedure Laterality Date   cataract surgery  12-26-12 and 01-09-13   COLONOSCOPY  2011   neg   COLONOSCOPY W/ POLYPECTOMY     X 2 , Dr  Sheryn Bison; angiodysplasia. Due 2022   DILATION AND CURETTAGE OF UTERUS     FACIAL COSMETIC SURGERY     HEMORROIDECTOMY     LUMBAR LAMINECTOMY/DECOMPRESSION MICRODISCECTOMY  09/10/2011   Procedure: LUMBAR LAMINECTOMY/DECOMPRESSION MICRODISCECTOMY;  Surgeon: Javier Docker, MD;  Location: [facility];  Service: Orthopedics;  Laterality: N/A;  decompression laminectomy L2-3, L3-4, L4-5   ORIF WRIST FRACTURE  01/11/2012   Procedure: OPEN REDUCTION INTERNAL FIXATION (ORIF) WRIST FRACTURE;  Surgeon: Tami Ribas, MD;  Location:  SUR[facility]ERY [facility];  Service: Orthopedics;  Laterality: Right;   TEAR DUCT PROBING     X 2   TOTAL HIP ARTHROPLASTY  2000   right   TUBAL LIGATION     Social History:  reports that she has never smoked. She has never used smokeless tobacco. She reports current alcohol use of about 3.0 standard drinks of alcohol per week. She reports that she does not use drugs.  Allergies  Allergen Reactions   Codeine Nausea Only   Duloxetine Other (See Comments)    REACTION: intolerance--She does not remember taking this Rx- states she  did not feel well    Sertraline Hcl Other (See Comments)    REACTION: intolerance-- Did not help with Depression    Family History  Problem Relation Age of Onset   Heart attack Father 20   Kidney disease Mother        renal failure   Throat cancer Sister        smoker   Osteoporosis Sister    Heart attack Brother 62   Stroke Brother 52       smoker   Depression Maternal Uncle    Cancer Brother        X3  lung cancer, all smokers   Peripheral vascular disease Daughter    Colon cancer Neg Hx    Diabetes Neg Hx     Prior to Admission medications   Medication Sig Start Date End Date Taking? Authorizing Provider  acetaminophen (TYLENOL) 325 MG tablet Take 2 tablets (650 mg total) by mouth every 6 (six) hours as needed for mild pain (or Fever >/= 101). 08/01/21  Yes Albertine Grates, MD  apixaban (ELIQUIS) 2.5 MG TABS tablet TAKE 1 TABLET(2.5 MG) BY MOUTH TWICE DAILY 07/19/22  Yes Camnitz, Andree Coss, MD  Ascorbic Acid (VITAMIN C) 1000 MG tablet Take 1,000 mg by mouth daily.   Yes [provider]  Calcium Carbonate (CALCIUM 600 PO) Take 600 mg by mouth daily.   Yes [provider]  Cholecalciferol (VITAMIN D) 2000 UNITS CAPS Take 2,000 Units by mouth daily.   Yes [provider]  ferrous gluconate (FERGON) 324 MG tablet Take 1 tablet (324 mg total) by mouth daily. 02/24/23  Yes Seabron Spates R, DO  furosemide (LASIX) 40 MG tablet Take 1 tablet (40 mg total) by mouth daily. 08/19/22  Yes Donato Schultz, DO  hydrOXYzine (VISTARIL) 25 MG capsule Take 1 capsule (25 mg total) by mouth every 8 (eight) hours as needed for anxiety. 08/19/22  Yes Seabron Spates R, DO  mupirocin ointment (BACTROBAN) 2 % Apply 1 Application topically 2 (two) times daily. 09/22/22  Yes Saguier, Ramon Dredge, PA-C  oxyCODONE-acetaminophen (PERCOCET/ROXICET) 5-325 MG tablet Take 1 tablet by mouth every 6 (six) hours as needed for severe pain (pain score 7-10). Patient not taking: Reported  on 03/17/2023 03/11/23   Terrilee Files, MD  Polyethyl Glycol-Propyl Glycol (SYSTANE ULTRA OP) Place 1 drop into both eyes daily as needed (for dry eyes).   Yes [provider]  polyethylene glycol (MIRALAX / GLYCOLAX) 17 g packet Take 17 g by mouth 2 (two) times daily. Patient taking differently: Take 17 g by mouth daily as needed for moderate constipation. 08/01/21  Yes Albertine Grates, MD  pregabalin (LYRICA) 50 MG capsule TAKE 1 CAPSULE(50 MG) BY MOUTH AT BEDTIME 02/17/23  Yes Seabron Spates R, DO  vitamin B-12 (CYANOCOBALAMIN) 1000 MCG tablet Take 1 tablet (1,000 mcg  total) by mouth daily. 08/01/21  Yes Albertine Grates, MD    Physical Exam: Vitals:   03/17/23 1224 03/17/23 1230 03/17/23 1300 03/17/23 1400  BP:  (!) 143/94 (!) 141/87 132/80  Pulse:  (!) 114 (!) 102 88  Resp:  20 20 16   Temp:      TempSrc:      SpO2:  92% 97% 96%  Weight: 61 kg     Height: 5\' 5"  (1.651 m)      Examination limited by back pain. Physical Exam Vitals and nursing note reviewed.  Constitutional:      General: She is not in acute distress.    Appearance: Normal appearance. She is ill-appearing.     Interventions: Nasal cannula in place.  HENT:     Head: Normocephalic.     Nose: No rhinorrhea.     Mouth/Throat:     Mouth: Mucous membranes are dry.  Eyes:     General: No scleral icterus.    Pupils: Pupils are equal, round, and reactive to light.  Neck:     Vascular: No JVD.  Cardiovascular:     Rate and Rhythm: Normal rate and regular rhythm.     Heart sounds: S1 normal and S2 normal.  Pulmonary:     Effort: Pulmonary effort is normal.     Breath sounds: Normal breath sounds.  Abdominal:     General: Bowel sounds are normal. There is no distension.     Palpations: Abdomen is soft.     Tenderness: There is no abdominal tenderness.  Musculoskeletal:     Cervical back: Neck supple.     Right lower leg: No edema.     Left lower leg: No edema.  Skin:    General: Skin is warm.     Comments:  Bilateral pretibial erythema. Per ED: There is 2 cm grade 2 ulcer on the right buttocks does not actively hemorrhaging, no bony protrusions or adipose tissue present, no purulent discharge in the area, nonfluctuant.   Neurological:     General: No focal deficit present.     Mental Status: She is alert. She is disoriented.  Psychiatric:        Mood and Affect: Mood normal.        Behavior: Behavior is cooperative.    Data Reviewed:  Results are pending, will review when available. 07/23/2021 echocardiogram report. IMPRESSIONS:   1. Left ventricular ejection fraction, by estimation, is 50 to 55%. The  left ventricle has low normal function. The left ventricle has no regional  wall motion abnormalities. There is mild concentric left ventricular  hypertrophy. Left ventricular  diastolic parameters are indeterminate.   2. Right ventricular systolic function is normal. The right ventricular  size is normal. There is normal pulmonary artery systolic pressure. The  estimated right ventricular systolic pressure is 23.6 mmHg.   3. Left atrial size was severely dilated.   4. The mitral valve is normal in structure. Mild to moderate mitral valve  regurgitation. No evidence of mitral stenosis.   5. The aortic valve is tricuspid. There is mild thickening of the aortic  valve. Aortic valve regurgitation is mild to moderate.   6. Aortic dilatation noted. There is mild dilatation of the ascending  aorta, measuring 43 mm.   7. The inferior vena cava is normal in size with greater than 50%  respiratory variability, suggesting right atrial pressure of 3 mmHg.   EKG: Vent. rate 115 BPM PR interval * ms QRS duration 86  ms QT/QTcB 319/442 ms P-R-T axes * 135 9 Atrial fibrillation Left posterior fascicular block Abnormal R-wave progression, late transition Borderline repolarization abnormality  Assessment and Plan: Principal Problem:   Acute metabolic encephalopathy In the setting of:    Cellulitis of lower extremity Admit to PCU/inpatient. Neurochecks every 4 hours. Continue ceftriaxone 1 g IVPB daily. Follow-up blood culture and sensitivity. Follow-up CBC and chemistry in the morning.  Active Problems:   Lumbar compression fracture (HCC) Continue Percocet every 6 hours as needed. -Hold if the patient is somnolent.    Atelectasis, bilateral  Currently on nasal cannula oxygen. No obvious infiltrates on imaging. Incentive spirometry ordered. Consider chest CT imaging if no improvement.    HTN (hypertension) Continue furosemide 40 mg p.o. daily.    GERD (gastroesophageal reflux disease) Antiacid, H2 blocker or PPI as needed.    Vitamin B12 deficiency Continue vitamin B12 supplementation.    Peripheral neuropathy Continue pregabalin 50 mg p.o. bedtime.    CKD (chronic kidney disease) stage 4, GFR 15-29 ml/min (HCC) Monitor renal function and electrolytes.    Heart failure with recovered ejection fraction (HFrecEF) (HCC) Continue furosemide 40 mg p.o. daily. Follow-up with cardiology as an outpatient.    Paroxysmal atrial fibrillation (HCC) CHA?DS?-VASc Score of at least 6. (No diabetes or CVA history). Continue apixaban 2.5 mg p.o. twice daily.    HYPERLIPIDEMIA Currently not on therapy. Most likely patient's preference given her age. Follow-up with primary care provider as an outpatient.    Advance Care Planning:   Code Status: Limited: Do not attempt resuscitation (DNR) -DNR-LIMITED -Do Not Intubate/DNI .  Consults:   Family Communication: Her husband was at bedside.  Severity of Illness: The appropriate patient status for this patient is INPATIENT. Inpatient status is judged to be reasonable and necessary in order to provide the required intensity of service to ensure the patient's safety. The patient's presenting symptoms, physical exam findings, and initial radiographic and laboratory data in the context of their chronic comorbidities is felt  to place them at high risk for further clinical deterioration. Furthermore, it is not anticipated that the patient will be medically stable for discharge from the hospital within 2 midnights of admission.   * I certify that at the point of admission it is my clinical judgment that the patient will require inpatient hospital care spanning beyond 2 midnights from the point of admission due to high intensity of service, high risk for further deterioration and high frequency of surveillance required.*  Author: Bobette Mo, MD 03/17/2023 2:51 PM  For on call review [email or web address].   This document was prepared using Dragon voice recognition software and may contain some unintended transcription errors.

## 2023-03-18 ENCOUNTER — Other Ambulatory Visit (HOSPITAL_COMMUNITY): Payer: Medicare Other

## 2023-03-18 DIAGNOSIS — G9341 Metabolic encephalopathy: Secondary | ICD-10-CM | POA: Diagnosis not present

## 2023-03-18 LAB — AMMONIA: Ammonia: 11 umol/L (ref 9–35)

## 2023-03-18 LAB — COMPREHENSIVE METABOLIC PANEL
ALT: 17 U/L (ref 0–44)
AST: 14 U/L — ABNORMAL LOW (ref 15–41)
Albumin: 3 g/dL — ABNORMAL LOW (ref 3.5–5.0)
Alkaline Phosphatase: 69 U/L (ref 38–126)
Anion gap: 10 (ref 5–15)
BUN: 33 mg/dL — ABNORMAL HIGH (ref 8–23)
CO2: 22 mmol/L (ref 22–32)
Calcium: 8.8 mg/dL — ABNORMAL LOW (ref 8.9–10.3)
Chloride: 105 mmol/L (ref 98–111)
Creatinine, Ser: 2.04 mg/dL — ABNORMAL HIGH (ref 0.44–1.00)
GFR, Estimated: 23 mL/min — ABNORMAL LOW (ref >60)
Glucose, Bld: 80 mg/dL (ref 70–99)
Potassium: 4.4 mmol/L (ref 3.5–5.1)
Sodium: 137 mmol/L (ref 135–145)
Total Bilirubin: 0.7 mg/dL (ref <1.2)
Total Protein: 6.3 g/dL — ABNORMAL LOW (ref 6.5–8.1)

## 2023-03-18 LAB — URINALYSIS, ROUTINE W REFLEX MICROSCOPIC
Bilirubin Urine: NEGATIVE
Glucose, UA: NEGATIVE mg/dL
Hgb urine dipstick: NEGATIVE
Ketones, ur: 5 mg/dL — AB
Leukocytes,Ua: NEGATIVE
Nitrite: NEGATIVE
Protein, ur: NEGATIVE mg/dL
Specific Gravity, Urine: 1.013 (ref 1.005–1.030)
pH: 5 (ref 5.0–8.0)

## 2023-03-18 LAB — CBC
HCT: 37.3 % (ref 36.0–46.0)
Hemoglobin: 11.8 g/dL — ABNORMAL LOW (ref 12.0–15.0)
MCH: 30.3 pg (ref 26.0–34.0)
MCHC: 31.6 g/dL (ref 30.0–36.0)
MCV: 95.9 fL (ref 80.0–100.0)
Platelets: 285 10*3/uL (ref 150–400)
RBC: 3.89 MIL/uL (ref 3.87–5.11)
RDW: 16.1 % — ABNORMAL HIGH (ref 11.5–15.5)
WBC: 6.1 10*3/uL (ref 4.0–10.5)
nRBC: 0 % (ref 0.0–0.2)

## 2023-03-18 LAB — TSH: TSH: 4.397 u[IU]/mL (ref 0.350–4.500)

## 2023-03-18 LAB — RPR: RPR Ser Ql: NONREACTIVE

## 2023-03-18 LAB — GLUCOSE, CAPILLARY
Glucose-Capillary: 95 mg/dL (ref 70–99)
Glucose-Capillary: 82 mg/dL (ref 70–99)
Glucose-Capillary: 149 mg/dL — ABNORMAL HIGH (ref 70–99)

## 2023-03-18 LAB — CORTISOL: Cortisol, Plasma: 20.3 ug/dL

## 2023-03-18 MED ORDER — ACETAMINOPHEN 500 MG PO TABS
500.0000 mg | ORAL_TABLET | Freq: Three times a day (TID) | ORAL | Status: DC
  Administered 2023-03-18 (×3): 500 mg via ORAL
  Filled 2023-03-18 (×3): qty 1

## 2023-03-18 MED ORDER — THIAMINE HCL 100 MG/ML IJ SOLN
500.0000 mg | Freq: Every day | INTRAVENOUS | Status: AC
  Administered 2023-03-18 – 2023-03-21 (×4): 500 mg via INTRAVENOUS
  Filled 2023-03-18 (×4): qty 5

## 2023-03-18 MED ORDER — FUROSEMIDE 20 MG PO TABS: 20.0000 mg | ORAL_TABLET | Freq: Every day | ORAL | Status: DC

## 2023-03-18 MED ORDER — OXYCODONE HCL 5 MG PO TABS: 2.5000 mg | ORAL_TABLET | Freq: Four times a day (QID) | ORAL | Status: DC | PRN

## 2023-03-18 MED ORDER — LIDOCAINE 5 % EX PTCH
1.0000 | MEDICATED_PATCH | CUTANEOUS | Status: DC
Start: 2023-03-18 — End: 2023-03-23
  Administered 2023-03-18 – 2023-03-23 (×6): 1 via TRANSDERMAL
  Filled 2023-03-18 (×6): qty 1

## 2023-03-18 MED ORDER — SENNOSIDES-DOCUSATE SODIUM 8.6-50 MG PO TABS
1.0000 | ORAL_TABLET | Freq: Two times a day (BID) | ORAL | Status: DC
Start: 2023-03-18 — End: 2023-03-24
  Administered 2023-03-18 – 2023-03-23 (×9): 1 via ORAL
  Filled 2023-03-18 (×10): qty 1

## 2023-03-18 MED ORDER — BOOST / RESOURCE BREEZE PO LIQD CUSTOM
1.0000 | Freq: Three times a day (TID) | ORAL | Status: DC
  Administered 2023-03-18 – 2023-03-23 (×10): 1 via ORAL

## 2023-03-18 MED ORDER — DEXAMETHASONE SODIUM PHOSPHATE 4 MG/ML IJ SOLN: 4.0000 mg | Freq: Once | INTRAMUSCULAR | Status: DC

## 2023-03-18 MED ORDER — FAMOTIDINE 20 MG PO TABS
10.0000 mg | ORAL_TABLET | Freq: Every day | ORAL | Status: DC
  Administered 2023-03-18 – 2023-03-23 (×6): 10 mg via ORAL
  Filled 2023-03-18 (×7): qty 1

## 2023-03-18 MED ORDER — CYANOCOBALAMIN 1000 MCG/ML IJ SOLN
1000.0000 ug | Freq: Every day | INTRAMUSCULAR | Status: AC
  Administered 2023-03-18 – 2023-03-22 (×5): 1000 ug via INTRAMUSCULAR
  Filled 2023-03-18 (×5): qty 1

## 2023-03-18 NOTE — TOC Initial Note (Addendum)
Transition of Care [facility]) - Initial/Assessment Note    Patient Details  Name: Krystal Henderson MRN: [id number] Date of Birth: 1933/01/20  Transition of Care [facility]) CM/SW Contact:    Lanier Clam, RN Phone Number: 03/18/2023, 12:39 PM  Clinical Narrative:  Referral for ST SNF. Await PT/OT recc.                Expected Discharge Plan: Home/Self Care Barriers to Discharge: Continued Medical Work up   Patient Goals and CMS Choice Patient states their goals for this hospitalization and ongoing recovery are:: Home CMS Medicare.gov Compare Post Acute Care list provided to:: Patient Choice offered to / list presented to : Patient [place] ownership interest in [facility].provided to:: Patient    Expected Discharge Plan and Services   Discharge Planning Services: CM Consult Post Acute Care Choice: Resumption of Svcs/PTA Provider Living arrangements for the past 2 months: Single Family Home                                      Prior Living Arrangements/Services Living arrangements for the past 2 months: Single Family Home   Patient language and need for interpreter reviewed:: Yes Do you feel safe going back to the place where you live?: Yes      Need for Family Participation in Patient Care: Yes (Comment) Care giver support system in place?: Yes (comment)   Criminal Activity/Legal Involvement Pertinent to Current Situation/Hospitalization: No - Comment as needed  Activities of Daily Living   ADL Screening (condition at time of admission) Independently performs ADLs?: No Does the patient have a NEW difficulty with bathing/dressing/toileting/self-feeding that is expected to last >3 days?: No Does the patient have a NEW difficulty with getting in/out of bed, walking, or climbing stairs that is expected to last >3 days?: No Does the patient have a NEW difficulty with communication that is expected to last >3 days?: No Is the patient deaf or have difficulty  hearing?: Yes Does the patient have difficulty seeing, even when wearing glasses/contacts?: Yes Does the patient have difficulty concentrating, remembering, or making decisions?: Yes  Permission Sought/Granted Permission sought to share information with : Case Manager Permission granted to share information with : Yes, Verbal Permission Granted              Emotional Assessment Appearance:: Appears stated age   Affect (typically observed): Accepting Orientation: : Oriented to Self Alcohol / Substance Use: Not Applicable Psych Involvement: No (comment)  Admission diagnosis:  Acute encephalopathy [G93.40] Cellulitis of lower extremity, unspecified laterality [L03.119] Acute metabolic encephalopathy [G93.41] Acute on chronic congestive heart failure, unspecified heart failure type (HCC) [I50.9] Pressure injury of right buttock, stage 2 (HCC) [L89.312] Sepsis with acute organ dysfunction without septic shock, due to unspecified organism, unspecified organ dysfunction type (HCC) [A41.9, R65.20] Patient Active Problem List   Diagnosis Date Noted   Acute metabolic encephalopathy 03/17/2023   Cellulitis of lower extremity 03/17/2023   Atelectasis, bilateral 03/17/2023   Delayed wound healing 09/28/2022   Heart failure with recovered ejection fraction (HFrecEF) (HCC) 07/24/2021   Leg wound, right 07/23/2021   Acute exacerbation of CHF (congestive heart failure) (HCC) 07/22/2021   Generalized weakness    CKD (chronic kidney disease) stage 4, GFR 15-29 ml/min (HCC)    Headache    Venous stasis dermatitis of right lower extremity 08/07/2020   Insomnia 08/07/2020   Acute on chronic  combined systolic and diastolic CHF (congestive heart failure) (HCC) 11/12/2019   Paroxysmal atrial fibrillation (HCC) 11/10/2019   Impaired mobility and ADLs 11/10/2019   Acute pulmonary embolism (HCC) 11/08/2019   Impacted cerumen of left ear 01/03/2019   Lesion of skin of scalp 12/30/2018   Edema  06/22/2018   Iron deficiency anemia 04/02/2018   Lumbar post-laminectomy syndrome 07/15/2017   Lumbar compression fracture (HCC) 11/26/2015   Lower extremity edema 10/23/2015   Chronic low back pain 06/21/2014   Pain in joint, shoulder region 02/11/2014   Vitamin D deficiency 03/30/2013   Acute kidney injury (HCC) 01/01/2012   Spinal stenosis 12/23/2011   Osteopenia 12/23/2011   Renal insufficiency, mild 08/24/2011   CAP (community acquired pneumonia) 05/08/2011   Vitamin B12 deficiency 09/24/2010   IBS (irritable bowel syndrome) 09/24/2010   Peripheral neuropathy 09/24/2010   Normocytic anemia 09/24/2010   GERD (gastroesophageal reflux disease) 08/12/2009   DIVERTICULOSIS, COLON 09/08/2007   Angiodysplasia of intestine with hemorrhage 09/08/2007   DJD (degenerative joint disease) 09/08/2007   History of colonic polyps 09/08/2007   HYPERLIPIDEMIA 01/23/2007   HTN (hypertension) 01/23/2007   Osteoporosis 01/23/2007   HYPOGLYCEMIA, REACTIVE 06/30/2006   PCP:  Donato Schultz, DO Pharmacy:   [facility] [phone] [place], [place] - [address] AT [address] [place] [address] Phone: [phone] Fax: [phone]  [id number] [place], [place] - [address] AT [facility] [address] [place] [address] Phone: [phone] Fax: [phone]     Social Drivers of Health (SDOH) Social History: SDOH Screenings   Food Insecurity: No Food Insecurity (03/17/2023)  Housing: Low Risk  (03/17/2023)  Transportation Needs: No Transportation Needs (03/17/2023)  Utilities: Not At Risk (03/17/2023)  Alcohol Screen: Low Risk  (02/22/2023)  Depression (PHQ2-9): Low Risk  (02/24/2023)  Financial Resource Strain: Low Risk  (02/22/2023)  Physical Activity: Unknown (02/22/2023)  Social Connections: Moderately Isolated (02/22/2023)  Stress: No Stress Concern Present (02/22/2023)  Tobacco Use: Low Risk  (03/17/2023)    SDOH Interventions:     Readmission Risk Interventions     No data to display

## 2023-03-18 NOTE — Progress Notes (Signed)
Heart Failure Navigator Progress Note  Assessed for Heart & Vascular TOC clinic readiness.  Patient does not meet criteria due to EF 50-55%, per MD admitted with Acute metabolic encephalopathy in the setting of cellulitis. No HF TOC at this time. .   Navigator will sign off at this time.   Rhae Hammock, BSN, Scientist, clinical (histocompatibility and immunogenetics) Only

## 2023-03-18 NOTE — Plan of Care (Signed)

## 2023-03-18 NOTE — Evaluation (Signed)
Physical Therapy Evaluation Patient Details Name: Krystal Henderson MRN: [id number] DOB: 1932-12-30 Today's Date: 03/18/2023  History of Present Illness  Pt is 89 yo female presented on 03/17/23 with AMS.  Pt with acute metabolic encephalopathy in setting of cellulitis of LE.   Pt recently in ED on 12/6 with compression fx T12 and chronic L1.  At that time neurosurgery (Dr Lovell Sheehan) recommended lumbar corset per ED note.  Spouse reports brace did not help or fit properly (pt kyphotic) and pt not wearing at home.  Pt with hx including but not limited to anemia, angiodysplasia of the colon, anxiety, chronic lower back pain, diverticulosis, colon polyps, degenerative joint disease, GERD, history of vertebral fracture, hypertension, hyperlipidemia, reactive hypoglycemia, pelvic fracture, peripheral neuropathy, vitamin B12 deficiency  Clinical Impression  Pt admitted with above diagnosis. At baseline, pt is ambulatory with rollator and lives with spouse.  Spouse reports pt typically cognitively intact.  Today, pt confused and requiring increased cues , time, and assist.  She needed mod A of 2 for safety with step pivot to [facility].  Spouse reports that lumbar corset did not work for pt - seems to be due to her severe kyphosis and has not been using at home.  Educated on and will follow back precautions.  Pt currently with functional limitations due to the deficits listed below (see PT Problem List). Pt will benefit from acute skilled PT to increase their independence and safety with mobility to allow discharge.  Patient will benefit from continued inpatient follow up therapy, <3 hours/day at d/c.          If plan is discharge home, recommend the following: A lot of help with walking and/or transfers;A lot of help with bathing/dressing/bathroom;Assistance with cooking/housework;Help with stairs or ramp for entrance   Can travel by private vehicle   No    Equipment Recommendations Wheelchair cushion  (measurements PT);Wheelchair (measurements PT)  Recommendations for Other Services       Functional Status Assessment Patient has had a recent decline in their functional status and demonstrates the ability to make significant improvements in function in a reasonable and predictable amount of time.     Precautions / Restrictions Precautions Precautions: Fall;Back Precaution Booklet Issued: No (pt confused) Precaution Comments: Performed tx with back precautions      Mobility  Bed Mobility Overal bed mobility: Needs Assistance Bed Mobility: Rolling, Sidelying to Sit, Sit to Sidelying Rolling: Mod assist Sidelying to sit: Mod assist, +2 for physical assistance     Sit to sidelying: Mod assist, +2 for physical assistance General bed mobility comments: log roll technique    Transfers Overall transfer level: Needs assistance Equipment used: 2 person hand held assist Transfers: Sit to/from Stand, Bed to chair/wheelchair/BSC Sit to Stand: Mod assist, +2 safety/equipment   Step pivot transfers: Mod assist, +2 physical assistance       General transfer comment: Increased time with cues for hand placement and safety.  STS x 2; pivoted from bed to bsc and back to bed; required assist for ADLs; posterior leans and requires assist to weight shift to initiate steps    Ambulation/Gait               General Gait Details: only steps to [facility]  Stairs            Wheelchair Mobility     Tilt Bed    Modified Rankin (Stroke Patients Only)       Balance Overall balance assessment: Needs assistance Sitting-balance  support: No upper extremity supported Sitting balance-Leahy Scale: Fair     Standing balance support: Bilateral upper extremity supported Standing balance-Leahy Scale: Poor Standing balance comment: HHA and min/mod A for balance with posterior lean; kyphotic and forward trunk                             Pertinent Vitals/Pain Pain  Assessment Pain Assessment: Faces Faces Pain Scale: Hurts a little bit Pain Location: back with transfers Pain Descriptors / Indicators: Grimacing Pain Intervention(s): Limited activity within patient's tolerance, Monitored during session, Repositioned    Home Living Family/patient expects to be discharged to:: Private residence Living Arrangements: Spouse/significant other Available Help at Discharge: Family;Available 24 hours/day (spouse does have part time job but can stay with her if needed) Type of Home: House Home Access: Stairs to enter Entrance Stairs-Rails: Doctor, general practice of Steps: 2-3   Home Layout: One level;Laundry or work area in [employer]: Rollator (4 wheels);Shower seat;BSC/3in1;Grab bars - tub/shower Additional Comments: Spouse provided hx    Prior Function Prior Level of Function : Independent/Modified Independent             Mobility Comments: Ambulates in home and short community distances with rollator ; spouse reports no falls; States pt has still been able to walk since recent ED visit ADLs Comments: Spouse helps in/out tub but otherwise she is independent with ADLs     Extremity/Trunk Assessment   Upper Extremity Assessment Upper Extremity Assessment: Generalized weakness;Difficult to assess due to impaired cognition    Lower Extremity Assessment Lower Extremity Assessment: Generalized weakness;Difficult to assess due to impaired cognition    Cervical / Trunk Assessment Cervical / Trunk Assessment: Kyphotic;Other exceptions Cervical / Trunk Exceptions: T12 and L1 compression fx  Communication      Cognition Arousal: Alert Behavior During Therapy: Anxious Overall Cognitive Status: Impaired/Different from baseline                                 General Comments: Spouse reports pt normally oriented, follows commands, no cognitive deficits.  Today, pt only oriented to self, follows limited commands,  perseverates on needing to urinate        General Comments General comments (skin integrity, edema, etc.): VSS on 2 L O2    Exercises     Assessment/Plan    PT Assessment Patient needs continued PT services  PT Problem List Decreased strength;Decreased range of motion;Decreased activity tolerance;Decreased balance;Decreased mobility;Decreased knowledge of precautions;Decreased safety awareness;Decreased knowledge of use of DME;Decreased cognition;Cardiopulmonary status limiting activity       PT Treatment Interventions DME instruction;Therapeutic exercise;Gait training;Balance training;Functional mobility training;Therapeutic activities;Patient/family education;Cognitive remediation;Neuromuscular re-education;Modalities    PT Goals (Current goals can be found in the Care Plan section)  Acute Rehab PT Goals Patient Stated Goal: spouse reports open to SNF if needed PT Goal Formulation: With patient/family Time For Goal Achievement: 04/01/23 Potential to Achieve Goals: Good    Frequency Min 1X/week     Co-evaluation               AM-PAC PT "6 Clicks" Mobility  Outcome Measure Help needed turning from your back to your side while in a flat bed without using bedrails?: A Lot Help needed moving from lying on your back to sitting on the side of a flat bed without using bedrails?: A Lot Help needed moving to and from  a bed to a chair (including a wheelchair)?: A Lot Help needed standing up from a chair using your arms (e.g., wheelchair or bedside chair)?: A Lot Help needed to walk in hospital room?: Total Help needed climbing 3-5 steps with a railing? : Total 6 Click Score: 10    End of Session Equipment Utilized During Treatment: Gait belt Activity Tolerance: Patient limited by pain (and confusion) Patient left: in bed;with call bell/phone within reach;with bed alarm set (heels floated) Nurse Communication: Mobility status PT Visit Diagnosis: Other abnormalities of gait  and mobility (R26.89);Muscle weakness (generalized) (M62.81)    Time: 1308-6578 PT Time Calculation (min) (ACUTE ONLY): 36 min   Charges:   PT Evaluation $PT Eval Low Complexity: 1 Low PT Treatments $Therapeutic Activity: 8-22 mins PT General Charges $$ ACUTE PT VISIT: 1 Visit         Anise Salvo, PT Acute Rehab [facility] [phone]   Rayetta Humphrey 03/18/2023, 5:48 PM

## 2023-03-18 NOTE — Progress Notes (Signed)
PROGRESS NOTE    Krystal Henderson  NTI:[id number] DOB: [birth year] DOA: 03/17/2023 PCP: Donato Schultz, DO   Brief Narrative: 89 year old with past medical history significant for anemia, angiodysplasia of the colon, anxiety, chronic lower back pain, diverticulosis, colon polyps, degenerative joint disease, GERD vertebral fracture, hypertension, reactive hypoglycemia, pelvic fracture, peripheral neuropathy, vitamin B-12 deficiency who was brought to the ED with altered mental status that is started the morning of admission.  She was alert and oriented only to person.  Patient admitted with acute metabolic encephalopathy, cellulitis of the right lower extremity   Assessment & Plan:   Principal Problem:   Acute metabolic encephalopathy Active Problems:   HYPERLIPIDEMIA   HTN (hypertension)   GERD (gastroesophageal reflux disease)   Vitamin B12 deficiency   Peripheral neuropathy   Lumbar compression fracture (HCC)   Paroxysmal atrial fibrillation (HCC)   CKD (chronic kidney disease) stage 4, GFR 15-29 ml/min (HCC)   Heart failure with recovered ejection fraction (HFrecEF) (HCC)   Cellulitis of lower extremity   Atelectasis, bilateral  1-Acute metabolic encephalopathy: In the setting of lower extremity cellulitis, delirium, pain. -CT head no acute intracranial abnormality -Check Thiamine. B 12 was low last week. Start supplement.  -Pain management. Careful with oversedation.  -renal function not to far from baseline.  -UA negative -poor oral intake might play a role.  -Check TSH: 4.3, ,Cortisol level, ammonia: 11 , RPR:   B 12 deficiency:  Started B 12 IM injection.   Lower extremity cellulitis -LE with redness. Continue with ceftriaxone, husband report she has chronic redness.    Lumbar compression fracture -CT Lumbar 12/06: Acute compression fracture of the T12 vertebral body with approximately 50% height loss both anterior and posteriorly. Chronic, although  significantly increased high-grade wedge deformity of L1 when compared to recent radiographs dated 02/04/2023. -Start tylenol schedule, lidocaine patch/ -reduce oxy to 2.5mg  to avoid oversedation.  -Palliative care consultation.   Hypertension: -Hold lasix due to poor oral intake.   GERD: -Pepcid.  B12 deficiency: -Continue with supplements, IM injection.   Peripheral neuropathy:  Continue with pregabalin  CKD stage IV: Creatinine baseline: 1.6--1.9 Chronic diastolic heart failure grade 1 Recover reduce chronic heart failure Hold lasix, poor oral intake.    Paroxysmal A-fib: -Continue with Eliquis.   Hyperlipidemia: Not on meds.  Follow up outpatient.   See wound care documentation below Pressure Injury 03/17/23 Buttocks Right Stage 2 -  Partial thickness loss of dermis presenting as a shallow open injury with a red, pink wound bed without slough. (Active)  03/17/23 2000  Location: Buttocks  Location Orientation: Right  Staging: Stage 2 -  Partial thickness loss of dermis presenting as a shallow open injury with a red, pink wound bed without slough.  Wound Description (Comments):   Present on Admission: Yes  Dressing Type Foam - Lift dressing to assess site every shift 03/17/23 2000     Pressure Injury 03/17/23 Sacrum Mid Stage 2 -  Partial thickness loss of dermis presenting as a shallow open injury with a red, pink wound bed without slough. (Active)  03/17/23 2000  Location: Sacrum  Location Orientation: Mid  Staging: Stage 2 -  Partial thickness loss of dermis presenting as a shallow open injury with a red, pink wound bed without slough.  Wound Description (Comments):   Present on Admission: Yes  Dressing Type Foam - Lift dressing to assess site every shift 03/17/23 2000     Pressure Injury 03/17/23 Vertebral column Lower;Mid Stage  1 -  Intact skin with non-blanchable redness of a localized area usually over a bony prominence. red ublanchable wound to spine X2  (2x1 and 1x1) (Active)  03/17/23 2000  Location: Vertebral column  Location Orientation: Lower;Mid  Staging: Stage 1 -  Intact skin with non-blanchable redness of a localized area usually over a bony prominence.  Wound Description (Comments): red ublanchable wound to spine X2 (2x1 and 1x1)  Present on Admission: Yes  Dressing Type Foam - Lift dressing to assess site every shift 03/17/23 2000                  Estimated body mass index is 23.04 kg/m as calculated from the following:   Height as of this encounter: 5\' 5"  (1.651 m).   Weight as of this encounter: 62.8 kg.   DVT prophylaxis: eliquis Code Status: DNR Family Communication: Husband at bedside.  Disposition Plan:  Status is: Inpatient Remains inpatient appropriate because: management of encephalopathy. Compression fracture    Consultants:  Palliative care  Procedures:  none Antimicrobials:    Subjective: She is sleepy, just received oxycodone. Husband report more confusion and sleepiness at home. She got oxy at home 3 days prior to admission.  She said she needs to urinate.   Objective: Vitals:   03/17/23 2041 03/18/23 0016 03/18/23 0412 03/18/23 0500  BP:  136/88 (!) 144/76   Pulse:  74 96   Resp:      Temp:  (!) 97.4 F (36.3 C) (!) 97.5 F (36.4 C)   TempSrc:  Oral Oral   SpO2: 98% 100% 99%   Weight:    62.8 kg  Height:        Intake/Output Summary (Last 24 hours) at 03/18/2023 0743 Last data filed at 03/18/2023 0631 Gross per 24 hour  Intake 100 ml  Output 175 ml  Net -75 ml   Filed Weights   03/17/23 1224 03/18/23 0500  Weight: 61 kg 62.8 kg    Examination:  General exam: sleepy  Respiratory system: Clear to auscultation. Respiratory effort normal. Cardiovascular system: S1 & S2 heard, RRR.  Gastrointestinal system: Abdomen is nondistended, soft and nontender. No organomegaly or masses felt. Normal bowel sounds heard. Central nervous system: Alert and oriented. No focal  neurological deficits. Extremities: Symmetric 5 x 5 power.   Data Reviewed: I have personally reviewed following labs and imaging studies  CBC: Recent Labs  Lab 03/11/23 1555 03/17/23 1220 03/18/23 0507  WBC 7.4 6.6 6.1  NEUTROABS 5.8 4.3  --   HGB 12.2 13.2 11.8*  HCT 37.0 40.5 37.3  MCV 92.7 94.4 95.9  PLT 243 324 285   Basic Metabolic Panel: Recent Labs  Lab 03/11/23 1555 03/17/23 1220 03/18/23 0507  NA 139 137 137  K 4.1 4.0 4.4  CL 104 104 105  CO2 24 23 22   GLUCOSE 103* 91 80  BUN 32* 35* 33*  CREATININE 2.04* 2.21* 2.04*  CALCIUM 9.3 9.2 8.8*   GFR: Estimated Creatinine Clearance: 17.2 mL/min (A) (by C-G formula based on SCr of 2.04 mg/dL (H)). Liver Function Tests: Recent Labs  Lab 03/17/23 1220 03/18/23 0507  AST 16 14*  ALT 20 17  ALKPHOS 81 69  BILITOT 1.0 0.7  PROT 7.5 6.3*  ALBUMIN 3.7 3.0*   No results for input(s): "LIPASE", "AMYLASE" in the last 168 hours. No results for input(s): "AMMONIA" in the last 168 hours. Coagulation Profile: Recent Labs  Lab 03/17/23 1220  INR 2.0*   Cardiac Enzymes:  No results for input(s): "CKTOTAL", "CKMB", "CKMBINDEX", "TROPONINI" in the last 168 hours. BNP (last 3 results) No results for input(s): "PROBNP" in the last 8760 hours. HbA1C: No results for input(s): "HGBA1C" in the last 72 hours. CBG: No results for input(s): "GLUCAP" in the last 168 hours. Lipid Profile: No results for input(s): "CHOL", "HDL", "LDLCALC", "TRIG", "CHOLHDL", "LDLDIRECT" in the last 72 hours. Thyroid Function Tests: No results for input(s): "TSH", "T4TOTAL", "FREET4", "T3FREE", "THYROIDAB" in the last 72 hours. Anemia Panel: No results for input(s): "VITAMINB12", "FOLATE", "FERRITIN", "TIBC", "IRON", "RETICCTPCT" in the last 72 hours. Sepsis Labs: Recent Labs  Lab 03/17/23 1437  LATICACIDVEN 1.1    Recent Results (from the past 240 hours)  Resp panel by RT-PCR (RSV, Flu A&B, Covid) Anterior Nasal Swab     Status:  None   Collection Time: 03/17/23 12:22 PM   Specimen: Anterior Nasal Swab  Result Value Ref Range Status   SARS Coronavirus 2 by RT PCR NEGATIVE NEGATIVE Final    Comment: (NOTE) SARS-CoV-2 target nucleic acids are NOT DETECTED.  The SARS-CoV-2 RNA is generally detectable in upper respiratory specimens during the acute phase of infection. The lowest concentration of SARS-CoV-2 viral copies this assay can detect is 138 copies/mL. A negative result does not preclude SARS-Cov-2 infection and should not be used as the sole basis for treatment or other patient management decisions. A negative result may occur with  improper specimen collection/handling, submission of specimen other than nasopharyngeal swab, presence of viral mutation(s) within the areas targeted by this assay, and inadequate number of viral copies(<138 copies/mL). A negative result must be combined with clinical observations, patient history, and epidemiological information. The expected result is Negative.  Fact Sheet for Patients:  BloggerCourse.com  Fact Sheet for Healthcare Providers:  SeriousBroker.it  This test is no t yet approved or cleared by the [place] FDA and  has been authorized for detection and/or diagnosis of SARS-CoV-2 by FDA under an Emergency Use Authorization (EUA). This EUA will remain  in effect (meaning this test can be used) for the duration of the COVID-19 declaration under Section 564(b)(1) of the Act, 21 U.S.C.section 360bbb-3(b)(1), unless the authorization is terminated  or revoked sooner.       Influenza A by PCR NEGATIVE NEGATIVE Final   Influenza B by PCR NEGATIVE NEGATIVE Final    Comment: (NOTE) The Xpert Xpress SARS-CoV-2/FLU/RSV plus assay is intended as an aid in the diagnosis of influenza from Nasopharyngeal swab specimens and should not be used as a sole basis for treatment. Nasal washings and aspirates are unacceptable  for Xpert Xpress SARS-CoV-2/FLU/RSV testing.  Fact Sheet for Patients: BloggerCourse.com  Fact Sheet for Healthcare Providers: SeriousBroker.it  This test is not yet approved or cleared by the [place] FDA and has been authorized for detection and/or diagnosis of SARS-CoV-2 by FDA under an Emergency Use Authorization (EUA). This EUA will remain in effect (meaning this test can be used) for the duration of the COVID-19 declaration under Section 564(b)(1) of the Act, 21 U.S.C. section 360bbb-3(b)(1), unless the authorization is terminated or revoked.     Resp Syncytial Virus by PCR NEGATIVE NEGATIVE Final    Comment: (NOTE) Fact Sheet for Patients: BloggerCourse.com  Fact Sheet for Healthcare Providers: SeriousBroker.it  This test is not yet approved or cleared by the [place] FDA and has been authorized for detection and/or diagnosis of SARS-CoV-2 by FDA under an Emergency Use Authorization (EUA). This EUA will remain in effect (meaning this test can  be used) for the duration of the COVID-19 declaration under Section 564(b)(1) of the Act, 21 U.S.C. section 360bbb-3(b)(1), unless the authorization is terminated or revoked.  Performed at [facility], [address], [place], [place] [address]          Radiology Studies: CT Head Wo Contrast Result Date: 03/17/2023 CLINICAL DATA:  Altered mental status. EXAM: CT HEAD WITHOUT CONTRAST TECHNIQUE: Contiguous axial images were obtained from the base of the skull through the vertex without intravenous contrast. RADIATION DOSE REDUCTION: This exam was performed according to the departmental dose-optimization program which includes automated exposure control, adjustment of the mA and/or kV according to patient size and/or use of iterative reconstruction technique. COMPARISON:  Head CT 07/22/2021. FINDINGS:  Brain: No acute intracranial hemorrhage. Gray-white differentiation is preserved. No hydrocephalus or extra-axial collection. No mass effect or midline shift. Vascular: No hyperdense vessel or unexpected calcification. Skull: No calvarial fracture or suspicious bone lesion. Skull base is unremarkable. Sinuses/Orbits: No acute finding. Other: None. IMPRESSION: No acute intracranial abnormality. Electronically Signed   By: Orvan Falconer M.D.   On: 03/17/2023 14:10   DG Chest Port 1 View Result Date: 03/17/2023 CLINICAL DATA:  Questionable sepsis.  Evaluate for abnormality. EXAM: PORTABLE CHEST 1 VIEW COMPARISON:  Chest radiographs 02/04/2023, 07/26/2021; CT chest 09/04/2009 FINDINGS: Cardiac silhouette is again mildly enlarged for AP technique. Mediastinal contours are unchanged and grossly within normal limits. High-grade atherosclerotic calcifications within the aortic arch. Mild bilateral diffuse interstitial thickening is similar to prior. Left apical pleural chronic ossification is unchanged. Prominence of the central pulmonary vasculature is similar to prior. Mildly decreased lung volumes with likely mild bibasilar subsegmental atelectasis, similar to prior. No definite pleural effusion. No pneumothorax. Diffuse decreased bone mineralization. Moderate left and mild right glenohumeral joint space narrowing. IMPRESSION: 1. Mildly decreased lung volumes with likely mild bibasilar subsegmental atelectasis, similar to prior. No definite focal pneumonia. 2. Mild cardiomegaly and mild pulmonary vascular congestion, similar to prior. Electronically Signed   By: Neita Garnet M.D.   On: 03/17/2023 13:56        Scheduled Meds:  apixaban  2.5 mg Oral BID   vitamin C  1,000 mg Oral Daily   cyanocobalamin  1,000 mcg Oral Daily   furosemide  40 mg Oral Daily   pregabalin  50 mg Oral QHS   Continuous Infusions:  cefTRIAXone (ROCEPHIN)  IV Stopped (03/17/23 1344)     LOS: 1 day    Time spent: 35  minutes    Harmoni Lucus A Genesee Nase, MD [facility]   If 7PM-7AM, please contact night-coverage [email or web address]  03/18/2023, 7:43 AM

## 2023-03-18 NOTE — Plan of Care (Signed)
  Problem: Clinical Measurements: Goal: Respiratory complications will improve Outcome: Progressing Goal: Cardiovascular complication will be avoided Outcome: Progressing   Problem: Pain Management: Goal: General experience of comfort will improve Outcome: Progressing   Problem: Safety: Goal: Ability to remain free from injury will improve Outcome: Progressing   Problem: Skin Integrity: Goal: Risk for impaired skin integrity will decrease Outcome: Progressing

## 2023-03-19 ENCOUNTER — Inpatient Hospital Stay (HOSPITAL_COMMUNITY): Payer: Medicare Other

## 2023-03-19 DIAGNOSIS — I5031 Acute diastolic (congestive) heart failure: Secondary | ICD-10-CM

## 2023-03-19 DIAGNOSIS — K5903 Drug induced constipation: Secondary | ICD-10-CM | POA: Diagnosis not present

## 2023-03-19 DIAGNOSIS — G9341 Metabolic encephalopathy: Secondary | ICD-10-CM | POA: Diagnosis not present

## 2023-03-19 DIAGNOSIS — S32000D Wedge compression fracture of unspecified lumbar vertebra, subsequent encounter for fracture with routine healing: Secondary | ICD-10-CM | POA: Diagnosis not present

## 2023-03-19 DIAGNOSIS — T402X5A Adverse effect of other opioids, initial encounter: Secondary | ICD-10-CM

## 2023-03-19 LAB — ECHOCARDIOGRAM COMPLETE
AR max vel: 1.69 cm2
AV Area VTI: 1.8 cm2
AV Area mean vel: 1.68 cm2
AV Mean grad: 3 mm[Hg]
AV Peak grad: 5.7 mm[Hg]
Ao pk vel: 1.19 m/s
Area-P 1/2: 5.59 cm2
Calc EF: 38.4 %
Height: 65 in
MV M vel: 5.47 m/s
MV Peak grad: 119.8 mm[Hg]
P 1/2 time: 384 ms
S' Lateral: 3.9 cm
Single Plane A2C EF: 33.9 %
Single Plane A4C EF: 39.3 %
Weight: 2081.14 [oz_av]

## 2023-03-19 LAB — BASIC METABOLIC PANEL
Anion gap: 10 (ref 5–15)
BUN: 30 mg/dL — ABNORMAL HIGH (ref 8–23)
CO2: 25 mmol/L (ref 22–32)
Calcium: 8.7 mg/dL — ABNORMAL LOW (ref 8.9–10.3)
Chloride: 99 mmol/L (ref 98–111)
Creatinine, Ser: 1.78 mg/dL — ABNORMAL HIGH (ref 0.44–1.00)
GFR, Estimated: 27 mL/min — ABNORMAL LOW (ref >60)
Glucose, Bld: 97 mg/dL (ref 70–99)
Potassium: 3.9 mmol/L (ref 3.5–5.1)
Sodium: 134 mmol/L — ABNORMAL LOW (ref 135–145)

## 2023-03-19 LAB — GLUCOSE, CAPILLARY
Glucose-Capillary: 115 mg/dL — ABNORMAL HIGH (ref 70–99)
Glucose-Capillary: 122 mg/dL — ABNORMAL HIGH (ref 70–99)
Glucose-Capillary: 136 mg/dL — ABNORMAL HIGH (ref 70–99)
Glucose-Capillary: 140 mg/dL — ABNORMAL HIGH (ref 70–99)
Glucose-Capillary: 83 mg/dL (ref 70–99)
Glucose-Capillary: 99 mg/dL (ref 70–99)

## 2023-03-19 MED ORDER — ACETAMINOPHEN 500 MG PO TABS
1000.0000 mg | ORAL_TABLET | Freq: Three times a day (TID) | ORAL | Status: AC
  Administered 2023-03-19 – 2023-03-21 (×9): 1000 mg via ORAL
  Filled 2023-03-19 (×9): qty 2

## 2023-03-19 MED ORDER — ACETAMINOPHEN 500 MG PO TABS: 1000.0000 mg | ORAL_TABLET | Freq: Three times a day (TID) | ORAL | Status: DC

## 2023-03-19 MED ORDER — MEGESTROL ACETATE 40 MG PO TABS
160.0000 mg | ORAL_TABLET | Freq: Every day | ORAL | Status: DC
  Administered 2023-03-20 – 2023-03-23 (×4): 160 mg via ORAL
  Filled 2023-03-19 (×4): qty 4

## 2023-03-19 MED ORDER — CALCITONIN (SALMON) 200 UNIT/ACT NA SOLN
1.0000 | Freq: Every day | NASAL | Status: DC
Start: 2023-03-19 — End: 2023-03-24
  Administered 2023-03-19 – 2023-03-24 (×6): 1 via NASAL
  Filled 2023-03-19: qty 3.7

## 2023-03-19 NOTE — Evaluation (Signed)
Occupational Therapy Evaluation Patient Details Name: VELMER HOMEYER MRN: [id number] DOB: 1932/04/16 Today's Date: 03/19/2023   History of Present Illness Pt is 89 yo female presented on 03/17/23 with AMS.  Pt with acute metabolic encephalopathy in setting of cellulitis of LE.   Pt recently in ED on 12/6 with compression fx T12 and chronic L1.  At that time neurosurgery (Dr Lovell Sheehan) recommended lumbar corset per ED note.  Spouse reports brace did not help or fit properly (pt kyphotic) and pt not wearing at home.  Pt with hx including but not limited to anemia, angiodysplasia of the colon, anxiety, chronic lower back pain, diverticulosis, colon polyps, degenerative joint disease, GERD, history of vertebral fracture, hypertension, hyperlipidemia, reactive hypoglycemia, pelvic fracture, peripheral neuropathy, vitamin B12 deficiency   Clinical Impression   Pt admitted with the above. Pt currently with functional limitations due to the deficits listed below (see OT Problem List).  Pt will benefit from acute skilled OT to increase their safety and independence with ADL and functional mobility for ADL to facilitate discharge.         If plan is discharge home, recommend the following: A lot of help with walking and/or transfers;A lot of help with bathing/dressing/bathroom;Two people to help with bathing/dressing/bathroom;Two people to help with walking and/or transfers    Functional Status Assessment  Patient has had a recent decline in their functional status and demonstrates the ability to make significant improvements in function in a reasonable and predictable amount of time.  Equipment Recommendations          Precautions / Restrictions Precautions Precautions: Fall;Back Precaution Booklet Issued: No (pt confused) Precaution Comments: Performed tx with back precautions      Mobility Bed Mobility Overal bed mobility: Needs Assistance Bed Mobility: Rolling, Sidelying to Sit, Sit to  Sidelying, Supine to Sit, Sit to Supine Rolling: Mod assist Sidelying to sit: Mod assist Supine to sit: Mod assist Sit to supine: Mod assist Sit to sidelying: Mod assist General bed mobility comments: log roll technique    Transfers        Did not perform                  Balance Overall balance assessment: Needs assistance Sitting-balance support: No upper extremity supported Sitting balance-Leahy Scale: Fair     Standing balance support: Bilateral upper extremity supported Standing balance-Leahy Scale: Poor Standing balance comment: HHA and min/mod A for balance with posterior lean; kyphotic and forward trunk                           ADL either performed or assessed with clinical judgement   ADL Overall ADL's : Needs assistance/impaired Eating/Feeding: Sitting;Minimal assistance   Grooming: Minimal assistance;Sitting   Upper Body Bathing: Moderate assistance;Sitting   Lower Body Bathing: Maximal assistance;Sitting/lateral leans;Cueing for sequencing;Cueing for safety;Total assistance   Upper Body Dressing : Minimal assistance;Sitting   Lower Body Dressing: Maximal assistance;Sitting/lateral leans;Total assistance                 General ADL Comments: limited eval as pt fatigued, MD came in during session/ Spoke with MD and husband about need for ST SNF.  Pt has been to [facility] before     Vision Patient Visual Report: No change from baseline              Pertinent Vitals/Pain Pain Assessment Pain Score: 5  Pain Location: back with transfers Pain Descriptors / Indicators:  Grimacing, Discomfort Pain Intervention(s): Limited activity within patient's tolerance     Extremity/Trunk Assessment Upper Extremity Assessment Upper Extremity Assessment: Generalized weakness       Cervical / Trunk Assessment Cervical / Trunk Assessment: Kyphotic;Other exceptions Cervical / Trunk Exceptions: T12 and L1 compression fx   Communication  Communication Communication: No apparent difficulties   Cognition Arousal: Alert Behavior During Therapy: Anxious Overall Cognitive Status: Impaired/Different from baseline                                 General Comments: Spouse reports pt normally oriented, follows commands, no cognitive deficits.  Today, pt only oriented to self, follows limited commands, perseverates on needing to urinate     General Comments               Home Living Family/patient expects to be discharged to:: Private residence Living Arrangements: Spouse/significant other Available Help at Discharge: Family;Available 24 hours/day (spouse does have part time job but can stay with her if needed) Type of Home: House Home Access: Stairs to enter [employer] of Steps: 2-3 Entrance Stairs-Rails: Right;Left Home Layout: One level;Laundry or work area in basement     [employer] Shower/Tub: Chief Strategy Officer: Handicapped height     Home Equipment: Rollator (4 wheels);Shower seat;BSC/3in1;Grab bars - tub/shower          Prior Functioning/Environment Prior Level of Function : Independent/Modified Independent             Mobility Comments: Ambulates in home with rollator or short community distances; no falls ADLs Comments: Spouse helps in/out tub but otherwise she is independent with ADLs        OT Problem List: Decreased strength;Decreased activity tolerance;Decreased safety awareness;Pain;Decreased knowledge of use of DME or AE      OT Treatment/Interventions: Self-care/ADL training;Patient/family education    OT Goals(Current goals can be found in the care plan section) Acute Rehab OT Goals Patient Stated Goal: less pain OT Goal Formulation: With patient Time For Goal Achievement: 04/02/23 ADL Goals Pt Will Perform Eating: (P) with set-up;sitting Pt Will Perform Grooming: (P) with set-up;sitting Pt Will Perform Lower Body Dressing: (P) with mod  assist;sit to/from stand;sitting/lateral leans Pt Will Transfer to Toilet: (P) with mod assist;bedside commode Pt Will Perform Toileting - Clothing Manipulation and hygiene: (P) with mod assist;sitting/lateral leans;sit to/from stand  OT Frequency: Min 2X/week       AM-PAC OT "6 Clicks" Daily Activity     Outcome Measure Help from another person eating meals?: A Lot Help from another person taking care of personal grooming?: A Lot Help from another person toileting, which includes using toliet, bedpan, or urinal?: Total Help from another person bathing (including washing, rinsing, drying)?: A Lot Help from another person to put on and taking off regular upper body clothing?: A Lot Help from another person to put on and taking off regular lower body clothing?: Total 6 Click Score: 10   End of Session Nurse Communication: Mobility status  Activity Tolerance: Patient limited by fatigue Patient left: in chair  OT Visit Diagnosis: Unsteadiness on feet (R26.81);Muscle weakness (generalized) (M62.81);History of falling (Z91.81)                Time: 1430-1450 OT Time Calculation (min): 20 min Charges:  OT General Charges $OT Visit: 1 Visit OT Evaluation $OT Eval Moderate Complexity: 1 Mod    Etheline Geppert, Metro Kung 03/19/2023, 5:49 PM

## 2023-03-19 NOTE — Progress Notes (Signed)
PROGRESS NOTE    Krystal Henderson  GMW:[id number] DOB: 1932-04-06 DOA: 03/17/2023 PCP: Donato Schultz, DO   Brief Narrative: 89 year old with past medical history significant for anemia, angiodysplasia of the colon, anxiety, chronic lower back pain, diverticulosis, colon polyps, degenerative joint disease, GERD vertebral fracture, hypertension, reactive hypoglycemia, pelvic fracture, peripheral neuropathy, vitamin B-12 deficiency who was brought to the ED with altered mental status that is started the morning of admission.  She was alert and oriented only to person.  Patient admitted with acute metabolic encephalopathy, cellulitis of the right lower extremity   Assessment & Plan:   Principal Problem:   Acute metabolic encephalopathy Active Problems:   HYPERLIPIDEMIA   HTN (hypertension)   GERD (gastroesophageal reflux disease)   Vitamin B12 deficiency   Peripheral neuropathy   Lumbar compression fracture (HCC)   Paroxysmal atrial fibrillation (HCC)   CKD (chronic kidney disease) stage 4, GFR 15-29 ml/min (HCC)   Heart failure with recovered ejection fraction (HFrecEF) (HCC)   Cellulitis of lower extremity   Atelectasis, bilateral  1-Acute metabolic encephalopathy: In the setting of lower extremity cellulitis, delirium, pain. -CT head no acute intracranial abnormality -Check Thiamine. B 12 was low last week. Start supplement.  -Pain management. Careful with oversedation.  -Renal function not to far from baseline.  -UA negative -poor oral intake might play a role.  -TSH: 4.3, ,Cortisol level, ammonia: 11 , RPR: non reactive She is more alert, avoid high dose opioid.   B 12 deficiency:  Started B 12 IM injection.   Lower extremity cellulitis -LE with redness. Continue with ceftriaxone, husband report she has chronic redness.  On ceftriaxone.    Lumbar compression fracture -CT Lumbar 12/06: Acute compression fracture of the T12 vertebral body with approximately 50%  height loss both anterior and posteriorly. Chronic, although significantly increased high-grade wedge deformity of L1 when compared to recent radiographs dated 02/04/2023. -Started tylenol schedule, lidocaine patch/ -reduce oxy to 2.5mg  to avoid oversedation.  -Palliative care consultation.  Increase tylenol to 1 gr TID  Hypertension: -Hold lasix due to poor oral intake.   GERD: -Pepcid.  B12 deficiency: -Continue with supplements, IM injection.   Peripheral neuropathy:  Continue with pregabalin  CKD stage IV: Creatinine baseline: 1.6--1.9 Chronic diastolic heart failure grade 1 Recover reduce chronic heart failure Hold lasix, poor oral intake.    Paroxysmal A-fib: -Continue with Eliquis.   Hyperlipidemia: Not on meds.  Follow up outpatient.   See wound care documentation below Pressure Injury 03/17/23 Buttocks Right Stage 2 -  Partial thickness loss of dermis presenting as a shallow open injury with a red, pink wound bed without slough. (Active)  03/17/23 2000  Location: Buttocks  Location Orientation: Right  Staging: Stage 2 -  Partial thickness loss of dermis presenting as a shallow open injury with a red, pink wound bed without slough.  Wound Description (Comments):   Present on Admission: Yes  Dressing Type Foam - Lift dressing to assess site every shift 03/18/23 2000     Pressure Injury 03/17/23 Sacrum Mid Stage 2 -  Partial thickness loss of dermis presenting as a shallow open injury with a red, pink wound bed without slough. (Active)  03/17/23 2000  Location: Sacrum  Location Orientation: Mid  Staging: Stage 2 -  Partial thickness loss of dermis presenting as a shallow open injury with a red, pink wound bed without slough.  Wound Description (Comments):   Present on Admission: Yes  Dressing Type Foam - Lift dressing  to assess site every shift 03/18/23 2000     Pressure Injury 03/17/23 Vertebral column Lower;Mid Stage 1 -  Intact skin with non-blanchable  redness of a localized area usually over a bony prominence. red ublanchable wound to spine X2 (2x1 and 1x1) (Active)  03/17/23 2000  Location: Vertebral column  Location Orientation: Lower;Mid  Staging: Stage 1 -  Intact skin with non-blanchable redness of a localized area usually over a bony prominence.  Wound Description (Comments): red ublanchable wound to spine X2 (2x1 and 1x1)  Present on Admission: Yes  Dressing Type Foam - Lift dressing to assess site every shift 03/18/23 2000       Estimated body mass index is 21.64 kg/m as calculated from the following:   Height as of this encounter: 5\' 5"  (1.651 m).   Weight as of this encounter: 59 kg.   DVT prophylaxis: eliquis Code Status: DNR Family Communication: Husband at bedside.  Disposition Plan:  Status is: Inpatient Remains inpatient appropriate because: management of encephalopathy. Compression fracture    Consultants:  Palliative care  Procedures:  none Antimicrobials:    Subjective: She is alert, still having back pain.  She drank grapes juice while I was in the room.    Objective: Vitals:   03/19/23 0400 03/19/23 0455 03/19/23 0500 03/19/23 0600  BP:  120/68    Pulse:  90    Resp: 17   14  Temp:  97.6 F (36.4 C)    TempSrc:  Oral    SpO2:  100%    Weight:   59 kg   Height:        Intake/Output Summary (Last 24 hours) at 03/19/2023 0807 Last data filed at 03/19/2023 0200 Gross per 24 hour  Intake 815 ml  Output 1900 ml  Net -1085 ml   Filed Weights   03/17/23 1224 03/18/23 0500 03/19/23 0500  Weight: 61 kg 62.8 kg 59 kg    Examination:  General exam: Alert Respiratory system: CTA Cardiovascular system: S 1. S 2 RRR Gastrointestinal system: BS present soft,nt Central nervous system: alert Extremities: Symmetric 5 x 5 power.   Data Reviewed: I have personally reviewed following labs and imaging studies  CBC: Recent Labs  Lab 03/17/23 1220 03/18/23 0507  WBC 6.6 6.1  NEUTROABS  4.3  --   HGB 13.2 11.8*  HCT 40.5 37.3  MCV 94.4 95.9  PLT 324 285   Basic Metabolic Panel: Recent Labs  Lab 03/17/23 1220 03/18/23 0507 03/19/23 0518  NA 137 137 134*  K 4.0 4.4 3.9  CL 104 105 99  CO2 23 22 25   GLUCOSE 91 80 97  BUN 35* 33* 30*  CREATININE 2.21* 2.04* 1.78*  CALCIUM 9.2 8.8* 8.7*   GFR: Estimated Creatinine Clearance: 19.7 mL/min (A) (by C-G formula based on SCr of 1.78 mg/dL (H)). Liver Function Tests: Recent Labs  Lab 03/17/23 1220 03/18/23 0507  AST 16 14*  ALT 20 17  ALKPHOS 81 69  BILITOT 1.0 0.7  PROT 7.5 6.3*  ALBUMIN 3.7 3.0*   No results for input(s): "LIPASE", "AMYLASE" in the last 168 hours. Recent Labs  Lab 03/18/23 1050  AMMONIA 11   Coagulation Profile: Recent Labs  Lab 03/17/23 1220  INR 2.0*   Cardiac Enzymes: No results for input(s): "CKTOTAL", "CKMB", "CKMBINDEX", "TROPONINI" in the last 168 hours. BNP (last 3 results) No results for input(s): "PROBNP" in the last 8760 hours. HbA1C: No results for input(s): "HGBA1C" in the last 72 hours. CBG:  Recent Labs  Lab 03/18/23 1141 03/18/23 1629 03/18/23 1939 03/19/23 0457 03/19/23 0720  GLUCAP 95 82 149* 83 99   Lipid Profile: No results for input(s): "CHOL", "HDL", "LDLCALC", "TRIG", "CHOLHDL", "LDLDIRECT" in the last 72 hours. Thyroid Function Tests: Recent Labs    03/18/23 1050  TSH 4.397   Anemia Panel: No results for input(s): "VITAMINB12", "FOLATE", "FERRITIN", "TIBC", "IRON", "RETICCTPCT" in the last 72 hours. Sepsis Labs: Recent Labs  Lab 03/17/23 1437  LATICACIDVEN 1.1    Recent Results (from the past 240 hours)  Blood Culture (routine x 2)     Status: None (Preliminary result)   Collection Time: 03/17/23 12:15 PM   Specimen: BLOOD  Result Value Ref Range Status   Specimen Description   Final    BLOOD RIGHT ANTECUBITAL Performed at [facility], [address], [place], [place] [address]    Special Requests   Final     BOTTLES DRAWN AEROBIC AND ANAEROBIC Blood Culture results may not be optimal due to an inadequate volume of blood received in culture bottles Performed at [facility], [address] [place], [place] [address]    Culture   Final    NO GROWTH < 24 HOURS Performed at [facility], [address] [place], [place] [address]    Report Status PENDING  Incomplete  Blood Culture (routine x 2)     Status: None (Preliminary result)   Collection Time: 03/17/23 12:20 PM   Specimen: BLOOD  Result Value Ref Range Status   Specimen Description   Final    BLOOD BLOOD LEFT ARM Performed at [facility], [address], [place], [place] [address]    Special Requests   Final    BOTTLES DRAWN AEROBIC AND ANAEROBIC Blood Culture results may not be optimal due to an inadequate volume of blood received in culture bottles Performed at [facility], [address], [place], [place] [address]    Culture   Final    NO GROWTH < 24 HOURS Performed at [facility], [address], [place], [place] [address]    Report Status PENDING  Incomplete  Resp panel by RT-PCR (RSV, Flu A&B, Covid) Anterior Nasal Swab     Status: None   Collection Time: 03/17/23 12:22 PM   Specimen: Anterior Nasal Swab  Result Value Ref Range Status   SARS Coronavirus 2 by RT PCR NEGATIVE NEGATIVE Final    Comment: (NOTE) SARS-CoV-2 target nucleic acids are NOT DETECTED.  The SARS-CoV-2 RNA is generally detectable in upper respiratory specimens during the acute phase of infection. The lowest concentration of SARS-CoV-2 viral copies this assay can detect is 138 copies/mL. A negative result does not preclude SARS-Cov-2 infection and should not be used as the sole basis for treatment or other patient management decisions. A negative result may occur with  improper specimen collection/handling, submission of specimen other than nasopharyngeal swab, presence of viral  mutation(s) within the areas targeted by this assay, and inadequate number of viral copies(<138 copies/mL). A negative result must be combined with clinical observations, patient history, and epidemiological information. The expected result is Negative.  Fact Sheet for Patients:  BloggerCourse.com  Fact Sheet for Healthcare Providers:  SeriousBroker.it  This test is no t yet approved or cleared by the [place] FDA and  has been authorized for detection and/or diagnosis of SARS-CoV-2 by FDA under an Emergency Use Authorization (EUA). This EUA will remain  in  effect (meaning this test can be used) for the duration of the COVID-19 declaration under Section 564(b)(1) of the Act, 21 U.S.C.section 360bbb-3(b)(1), unless the authorization is terminated  or revoked sooner.       Influenza A by PCR NEGATIVE NEGATIVE Final   Influenza B by PCR NEGATIVE NEGATIVE Final    Comment: (NOTE) The Xpert Xpress SARS-CoV-2/FLU/RSV plus assay is intended as an aid in the diagnosis of influenza from Nasopharyngeal swab specimens and should not be used as a sole basis for treatment. Nasal washings and aspirates are unacceptable for Xpert Xpress SARS-CoV-2/FLU/RSV testing.  Fact Sheet for Patients: BloggerCourse.com  Fact Sheet for Healthcare Providers: SeriousBroker.it  This test is not yet approved or cleared by the [place] FDA and has been authorized for detection and/or diagnosis of SARS-CoV-2 by FDA under an Emergency Use Authorization (EUA). This EUA will remain in effect (meaning this test can be used) for the duration of the COVID-19 declaration under Section 564(b)(1) of the Act, 21 U.S.C. section 360bbb-3(b)(1), unless the authorization is terminated or revoked.     Resp Syncytial Virus by PCR NEGATIVE NEGATIVE Final    Comment: (NOTE) Fact Sheet for  Patients: BloggerCourse.com  Fact Sheet for Healthcare Providers: SeriousBroker.it  This test is not yet approved or cleared by the [place] FDA and has been authorized for detection and/or diagnosis of SARS-CoV-2 by FDA under an Emergency Use Authorization (EUA). This EUA will remain in effect (meaning this test can be used) for the duration of the COVID-19 declaration under Section 564(b)(1) of the Act, 21 U.S.C. section 360bbb-3(b)(1), unless the authorization is terminated or revoked.  Performed at [facility], [address] [place], [place] [address]          Radiology Studies: CT Head Wo Contrast Result Date: 03/17/2023 CLINICAL DATA:  Altered mental status. EXAM: CT HEAD WITHOUT CONTRAST TECHNIQUE: Contiguous axial images were obtained from the base of the skull through the vertex without intravenous contrast. RADIATION DOSE REDUCTION: This exam was performed according to the departmental dose-optimization program which includes automated exposure control, adjustment of the mA and/or kV according to patient size and/or use of iterative reconstruction technique. COMPARISON:  Head CT 07/22/2021. FINDINGS: Brain: No acute intracranial hemorrhage. Gray-white differentiation is preserved. No hydrocephalus or extra-axial collection. No mass effect or midline shift. Vascular: No hyperdense vessel or unexpected calcification. Skull: No calvarial fracture or suspicious bone lesion. Skull base is unremarkable. Sinuses/Orbits: No acute finding. Other: None. IMPRESSION: No acute intracranial abnormality. Electronically Signed   By: Orvan Falconer M.D.   On: 03/17/2023 14:10   DG Chest Port 1 View Result Date: 03/17/2023 CLINICAL DATA:  Questionable sepsis.  Evaluate for abnormality. EXAM: PORTABLE CHEST 1 VIEW COMPARISON:  Chest radiographs 02/04/2023, 07/26/2021; CT chest 09/04/2009 FINDINGS: Cardiac silhouette is  again mildly enlarged for AP technique. Mediastinal contours are unchanged and grossly within normal limits. High-grade atherosclerotic calcifications within the aortic arch. Mild bilateral diffuse interstitial thickening is similar to prior. Left apical pleural chronic ossification is unchanged. Prominence of the central pulmonary vasculature is similar to prior. Mildly decreased lung volumes with likely mild bibasilar subsegmental atelectasis, similar to prior. No definite pleural effusion. No pneumothorax. Diffuse decreased bone mineralization. Moderate left and mild right glenohumeral joint space narrowing. IMPRESSION: 1. Mildly decreased lung volumes with likely mild bibasilar subsegmental atelectasis, similar to prior. No definite focal pneumonia. 2. Mild cardiomegaly and mild pulmonary vascular congestion, similar to prior. Electronically Signed   By: Windy Fast  Viola M.D.   On: 03/17/2023 13:56        Scheduled Meds:  acetaminophen  500 mg Oral TID   apixaban  2.5 mg Oral BID   vitamin C  1,000 mg Oral Daily   cyanocobalamin  1,000 mcg Intramuscular Daily   famotidine  10 mg Oral Daily   feeding supplement  1 Container Oral TID BM   lidocaine  1 patch Transdermal Q24H   pregabalin  50 mg Oral QHS   senna-docusate  1 tablet Oral BID   Continuous Infusions:  cefTRIAXone (ROCEPHIN)  IV Stopped (03/18/23 1227)   thiamine (VITAMIN B1) injection Stopped (03/18/23 1452)     LOS: 2 days    Time spent: 35 minutes    Mailee Klaas A Valon Glasscock, MD [facility]   If 7PM-7AM, please contact night-coverage [email or web address]  03/19/2023, 8:07 AM

## 2023-03-19 NOTE — Consult Note (Signed)
Consultation Note Date: 03/19/2023   Patient Name: Krystal Henderson  DOB: Dec 06, 1932  MRN: [id number]  Age / Sex: 89 y.o., female  PCP: Donato Schultz, DO Referring Physician: Alba Cory, MD  Reason for Consultation: Pain control  HPI/Patient Profile: 89 y.o. female  with past medical history of anemia, angiodysplasia of the colon, anxiety, chronic lower back pain, diverticulosis, colon polyps, degenerative joint disease, GERD, vertebral fracture, hypertension, reactive hypoglycemia, pelvic fracture, peripheral neuropathy, vitamin B12 deficiency admitted on 03/17/2023 with acute metabolic encephalopathy, cellulitis of the right lower extremity, and pain secondary to Lumbar compression fracture.   I saw and examined Krystal Henderson and met with her and her husband, Peyton Najjar.  We reviewed her current back pain which she reports is in her mid back, is sharp in nature, is 8 out of 10, and is partially relieved by resting and certain positions.  It is worth with certain movements, lying certain ways, and taking deep breaths.  It does not radiate.  No true weakness, however, she is limited in movement secondary to pain.  We discussed chronic nature of her back pain and treatment she has received in the past.  Discussed recent workup in ED and consultation with Dr. Lovell Sheehan that is pending.  He recommended using corset but she was unable to tolerate this.  We discussed use of medications and limitation secondary to side effects including the fact that she becomes confused/sedated with even low doses of opioids.  We talked about interventional procedures but her husband reports that images were reviewed and she is not a candidate for kyphoplasty.  Discussed trial of calcitonin, which may be of limited benefit but may be worth trying if she has not done so in the past.  We did discuss that this is a short-term medication  but it may be something to work on getting pain under better control until she can get to an outpatient appointment with Dr. Lovell Sheehan.  We also discussed that if this pain is more secondary to the retropulsion into the spinal canal, this would not be effective in helping with that symptom.  They would like to try to see if it is beneficial.   Code Status/Advance Care Planning: DNR   Symptom Management:  Back pain, chronic with acute exacerbation: Long standing back pain with notes in CareEverywhere back to at least 2019.  Acute worseing over the past few weeks resulting in hospitalization.  Imaging reveals  Acute compression fracture of the T12 vertebral body with approximately 50% height loss both anterior and posteriorly. Chronic, although significantly increased high-grade wedge deformity of L1 when compared to recent radiographs dated 02/04/2023. There is 0.9 cm of fragment retropulsion into the spinal canal (series 44, image 30).  Per husband, she was not a candidate for kyphoplasty, did not tolerate corset, and plan is for f/u with Dr. Lovell Sheehan as OP.    - Agree with increasing tylenol to 1000mg  TID  - Continue lidocaine patch  -Trial calcitonin nasal spray  - Low dose oxycodone  prn.  If causes more confusion/sedation, may try dilaudid to see if she tolerates another opiod any better.  - F/u with neurosurgery Constipation: potential opioid related component.  Last BM 2 days ago.  Senna S 1 tab BID.  Palliative Prophylaxis:  Bowel Regimen  Psycho-social/Spiritual:  Desire for further Chaplaincy support:no  Prognosis:  > 12 months  Discharge Planning: To Be Determined      Primary Diagnoses: Present on Admission:  Acute metabolic encephalopathy  CKD (chronic kidney disease) stage 4, GFR 15-29 ml/min (HCC)  GERD (gastroesophageal reflux disease)  HTN (hypertension)  HYPERLIPIDEMIA  Lumbar compression fracture (HCC)  Vitamin B12 deficiency  Paroxysmal atrial fibrillation  (HCC)  Heart failure with recovered ejection fraction (HFrecEF) (HCC)  Cellulitis of lower extremity  Atelectasis, bilateral   I have reviewed the medical record, interviewed the patient and family, and examined the patient. The following aspects are pertinent.  Past Medical History:  Diagnosis Date   Anemia    hx of    Angiodysplasia 2007   @ colonoscopy   Anxiety    PMH of   Chronic low back pain 06/21/2014   Diverticulosis of colon (without mention of hemorrhage)    DJD (degenerative joint disease)    Esophageal reflux    inactive   History of vertebral fracture 11/2015   HTN (hypertension)    Hyperlipemia 2006   LDL 130   Hypoglycemia    reactive   Pelvic fracture (HCC) 12/30/2011   [facility]   Peripheral neuropathy    Personal history of colonic polyps    adenomatous   Vitamin B12 deficiency    Social History   Socioeconomic History   Marital status: Married    Spouse name: Peyton Najjar   Number of children: 1   Years of education: hs   Highest education level: 12th grade  Occupational History   Occupation: retired    Associate Professor: RETIRED  Tobacco Use   Smoking status: Never   Smokeless tobacco: Never  Substance and Sexual Activity   Alcohol use: Yes    Alcohol/week: 3.0 standard drinks of alcohol    Types: 3 Glasses of wine per week    Comment: wine occasionally   Drug use: No   Sexual activity: Yes    Birth control/protection: Diaphragm  Other Topics Concern   Not on file  Social History Narrative   Patient is right handed.   Patient does not drink caffeine.   Lives with husband   Lucila [place] lives nearby   Social Drivers of Health   Financial Resource Strain: Low Risk  (02/22/2023)   Overall Financial Resource Strain (CARDIA)    Difficulty of Paying Living Expenses: Not hard at all  Food Insecurity: No Food Insecurity (03/17/2023)   Hunger Vital Sign    Worried About Running Out of Food in the Last Year: Never true    Ran Out of Food in the Last  Year: Never true  Transportation Needs: No Transportation Needs (03/17/2023)   PRAPARE - Administrator, Civil Service (Medical): No    Lack of Transportation (Non-Medical): No  Physical Activity: Unknown (02/22/2023)   Exercise Vital Sign    Days of Exercise per Week: 0 days    Minutes of Exercise per Session: Not on file  Stress: No Stress Concern Present (02/22/2023)   [employer] of Occupational Health - Occupational Stress Questionnaire    Feeling of Stress : Only a little  Social Connections: Moderately Isolated (02/22/2023)   Social Connection  and Isolation Panel [NHANES]    Frequency of Communication with Friends and Family: Twice a week    Frequency of Social Gatherings with Friends and Family: Once a week    Attends Religious Services: Never    Database administrator or Organizations: No    Attends Engineer, structural: Not on file    Marital Status: Married   Family History  Problem Relation Age of Onset   Heart attack Father 55   Kidney disease Mother        renal failure   Throat cancer Sister        smoker   Osteoporosis Sister    Heart attack Brother 56   Stroke Brother 47       smoker   Depression Maternal Uncle    Cancer Brother        X3  lung cancer, all smokers   Peripheral vascular disease Daughter    Colon cancer Neg Hx    Diabetes Neg Hx    Scheduled Meds:  acetaminophen  1,000 mg Oral TID   apixaban  2.5 mg Oral BID   vitamin C  1,000 mg Oral Daily   calcitonin (salmon)  1 spray Alternating Nares Daily   cyanocobalamin  1,000 mcg Intramuscular Daily   famotidine  10 mg Oral Daily   feeding supplement  1 Container Oral TID BM   lidocaine  1 patch Transdermal Q24H   pregabalin  50 mg Oral QHS   senna-docusate  1 tablet Oral BID   Continuous Infusions:  cefTRIAXone (ROCEPHIN)  IV Stopped (03/18/23 1227)   thiamine (VITAMIN B1) injection 500 mg (03/19/23 1056)   PRN Meds:.albuterol, oxyCODONE, polyethylene  glycol Medications Prior to Admission:  Prior to Admission medications   Medication Sig Start Date End Date Taking? Authorizing Provider  acetaminophen (TYLENOL) 325 MG tablet Take 2 tablets (650 mg total) by mouth every 6 (six) hours as needed for mild pain (or Fever >/= 101). 08/01/21  Yes Albertine Grates, MD  apixaban (ELIQUIS) 2.5 MG TABS tablet TAKE 1 TABLET(2.5 MG) BY MOUTH TWICE DAILY 07/19/22  Yes Camnitz, Andree Coss, MD  Ascorbic Acid (VITAMIN C) 1000 MG tablet Take 1,000 mg by mouth daily.   Yes [provider]  Calcium Carbonate (CALCIUM 600 PO) Take 600 mg by mouth daily.   Yes [provider]  Cholecalciferol (VITAMIN D) 2000 UNITS CAPS Take 2,000 Units by mouth daily.   Yes [provider]  ferrous gluconate (FERGON) 324 MG tablet Take 1 tablet (324 mg total) by mouth daily. 02/24/23  Yes Seabron Spates R, DO  furosemide (LASIX) 40 MG tablet Take 1 tablet (40 mg total) by mouth daily. 08/19/22  Yes Donato Schultz, DO  hydrOXYzine (VISTARIL) 25 MG capsule Take 1 capsule (25 mg total) by mouth every 8 (eight) hours as needed for anxiety. 08/19/22  Yes Seabron Spates R, DO  mupirocin ointment (BACTROBAN) 2 % Apply 1 Application topically 2 (two) times daily. 09/22/22  Yes Saguier, Ramon Dredge, PA-C  Polyethyl Glycol-Propyl Glycol (SYSTANE ULTRA OP) Place 1 drop into both eyes daily as needed (for dry eyes).   Yes [provider]  polyethylene glycol (MIRALAX / GLYCOLAX) 17 g packet Take 17 g by mouth 2 (two) times daily. Patient taking differently: Take 17 g by mouth daily as needed for moderate constipation. 08/01/21  Yes Albertine Grates, MD  pregabalin (LYRICA) 50 MG capsule TAKE 1 CAPSULE(50 MG) BY MOUTH AT BEDTIME 02/17/23  Yes Zola Button, Myrene Buddy R, DO  vitamin B-12 (CYANOCOBALAMIN) 1000 MCG tablet Take 1 tablet (1,000 mcg total) by mouth daily. 08/01/21  Yes Albertine Grates, MD  oxyCODONE-acetaminophen (PERCOCET/ROXICET) 5-325 MG tablet Take 1 tablet by mouth  every 6 (six) hours as needed for severe pain (pain score 7-10). Patient not taking: Reported on 03/17/2023 03/11/23   Terrilee Files, MD   Allergies  Allergen Reactions   Codeine Nausea Only   Duloxetine Other (See Comments)    REACTION: intolerance--She does not remember taking this Rx- states she did not feel well    Sertraline Hcl Other (See Comments)    REACTION: intolerance-- Did not help with Depression   Review of Systems Pain in back, poor appetite, constipation, pain in leg  Physical Exam General: Alert, awake, in no acute distress.   Heart: Regular rate and rhythm. No murmur appreciated. Lungs: Good air movement, clear Abdomen: Soft, nontender, nondistended, positive bowel sounds.   Ext: Trace edema, bilateral LE erythema Skin: Warm and dry Neuro: Grossly intact, nonfocal.     Vital Signs: BP 123/68 (BP Location: Left Arm)   Pulse 87   Temp 98.6 F (37 C) (Axillary)   Resp 14   Ht 5\' 5"  (1.651 m)   Wt 59 kg   SpO2 100%   BMI 21.64 kg/m  Pain Scale: PAINAD   Pain Score: Asleep   SpO2: SpO2: 100 % O2 Device:SpO2: 100 % O2 Flow Rate: .O2 Flow Rate (L/min): 2 L/min  IO: Intake/output summary:  Intake/Output Summary (Last 24 hours) at 03/19/2023 1110 Last data filed at 03/19/2023 0900 Gross per 24 hour  Intake 815 ml  Output 2275 ml  Net -1460 ml    LBM: Last BM Date : 03/16/23 Baseline Weight: Weight: 61 kg Most recent weight: Weight: 59 kg     Palliative Assessment/Data:     Time In: 1010 Time Out: 1125 Time Total: 75 Greater than 50%  of this time was spent counseling and coordinating care related to the above assessment and plan.  Signed by: Romie Minus, MD   Please contact Palliative Medicine Team phone at [phone] for questions and concerns.  For individual provider: See Loretha Stapler

## 2023-03-19 NOTE — Progress Notes (Signed)
  Echocardiogram 2D Echocardiogram has been performed.  Krystal Henderson Harlie Buening 03/19/2023, 8:47 AM

## 2023-03-20 DIAGNOSIS — M545 Low back pain, unspecified: Secondary | ICD-10-CM

## 2023-03-20 DIAGNOSIS — S32000D Wedge compression fracture of unspecified lumbar vertebra, subsequent encounter for fracture with routine healing: Secondary | ICD-10-CM | POA: Diagnosis not present

## 2023-03-20 DIAGNOSIS — G8929 Other chronic pain: Secondary | ICD-10-CM | POA: Diagnosis not present

## 2023-03-20 DIAGNOSIS — G9341 Metabolic encephalopathy: Secondary | ICD-10-CM | POA: Diagnosis not present

## 2023-03-20 LAB — BASIC METABOLIC PANEL
Anion gap: 8 (ref 5–15)
BUN: 26 mg/dL — ABNORMAL HIGH (ref 8–23)
CO2: 25 mmol/L (ref 22–32)
Calcium: 8.7 mg/dL — ABNORMAL LOW (ref 8.9–10.3)
Chloride: 102 mmol/L (ref 98–111)
Creatinine, Ser: 1.56 mg/dL — ABNORMAL HIGH (ref 0.44–1.00)
GFR, Estimated: 32 mL/min — ABNORMAL LOW (ref >60)
Glucose, Bld: 100 mg/dL — ABNORMAL HIGH (ref 70–99)
Potassium: 4 mmol/L (ref 3.5–5.1)
Sodium: 135 mmol/L (ref 135–145)

## 2023-03-20 LAB — GLUCOSE, CAPILLARY
Glucose-Capillary: 105 mg/dL — ABNORMAL HIGH (ref 70–99)
Glucose-Capillary: 119 mg/dL — ABNORMAL HIGH (ref 70–99)
Glucose-Capillary: 120 mg/dL — ABNORMAL HIGH (ref 70–99)
Glucose-Capillary: 129 mg/dL — ABNORMAL HIGH (ref 70–99)
Glucose-Capillary: 134 mg/dL — ABNORMAL HIGH (ref 70–99)
Glucose-Capillary: 88 mg/dL (ref 70–99)

## 2023-03-20 MED ORDER — HYDROMORPHONE HCL 2 MG PO TABS
1.0000 mg | ORAL_TABLET | ORAL | Status: DC | PRN
  Administered 2023-03-20 – 2023-03-23 (×8): 1 mg via ORAL
  Filled 2023-03-20 (×8): qty 1

## 2023-03-20 NOTE — Progress Notes (Signed)
OT Cancellation Note  Patient Details Name: Krystal Henderson MRN: [id number] DOB: [birth year]   Cancelled Treatment:    Reason Eval/Treat Not Completed: Fatigue/lethargy limiting ability to participate OT check in with pt and not declined attempting any activity despite encouragement. RN stated she was a little confused this morning before her husband arrived. Will check back as schedule allows  [name], Krystal Henderson 03/20/2023, 1:36 PM

## 2023-03-20 NOTE — Plan of Care (Signed)
  Problem: Nutrition: Goal: Adequate nutrition will be maintained Outcome: Progressing   Problem: Safety: Goal: Ability to remain free from injury will improve Outcome: Progressing   Problem: Pain Management: Goal: General experience of comfort will improve Outcome: Not Progressing

## 2023-03-20 NOTE — Progress Notes (Signed)
PROGRESS NOTE    Krystal Henderson  GNF:[id number] DOB: [birth year] DOA: 03/17/2023 PCP: Donato Schultz, DO   Brief Narrative: 89 year old with past medical history significant for anemia, angiodysplasia of the colon, anxiety, chronic lower back pain, diverticulosis, colon polyps, degenerative joint disease, GERD vertebral fracture, hypertension, reactive hypoglycemia, pelvic fracture, peripheral neuropathy, vitamin B-12 deficiency who was brought to the ED with altered mental status that is started the morning of admission.  She was alert and oriented only to person.  Patient admitted with acute metabolic encephalopathy, cellulitis of the right lower extremity. New reduce systolic HF.    Assessment & Plan:   Principal Problem:   Acute metabolic encephalopathy Active Problems:   HYPERLIPIDEMIA   HTN (hypertension)   GERD (gastroesophageal reflux disease)   Vitamin B12 deficiency   Peripheral neuropathy   Lumbar compression fracture (HCC)   Paroxysmal atrial fibrillation (HCC)   CKD (chronic kidney disease) stage 4, GFR 15-29 ml/min (HCC)   Heart failure with recovered ejection fraction (HFrecEF) (HCC)   Cellulitis of lower extremity   Atelectasis, bilateral  1-Acute metabolic encephalopathy: In the setting of lower extremity cellulitis, delirium, pain. -CT head no acute intracranial abnormality -Check Thiamine. B 12 was low last week. Start supplement.  -Pain management. Careful with oversedation.  -Renal function not to far from baseline.  -UA negative -poor oral intake might play a role.  -TSH: 4.3, ,Cortisol level, ammonia: 11 , RPR: non reactive -on and off fall asleep per husband.  She has not received oxycodone.   Acute Systolic Heart failure:  ECHO 84/69: EF 35 % global hypokinesis.  Holding lasix due to poor oral inatke.  Monitor volume status. Lasix PRN.  Weight: 134--138--130--136-- probably weight is not accurate.  Cardiology will see in consultation  12/16. CKD will limit therapy.    B 12 deficiency:  Started B 12 IM injection.   Lower extremity cellulitis -LE with redness. Continue with ceftriaxone, husband report she has chronic redness.  On ceftriaxone.    Lumbar compression fracture -CT Lumbar 12/06: Acute compression fracture of the T12 vertebral body with approximately 50% height loss both anterior and posteriorly. Chronic, although significantly increased high-grade wedge deformity of L1 when compared to recent radiographs dated 02/04/2023. -Started tylenol schedule, lidocaine patch/ -reduce oxy to 2.5mg  to avoid oversedation.  -Palliative care consultation.  Increase tylenol to 1 gr TID on 12/14 On calcitonin spray.   Hypertension: -Hold lasix due to poor oral intake.   GERD: -Pepcid.  B12 deficiency: -Continue with supplements, IM injection.   Peripheral neuropathy:  Continue with pregabalin  CKD stage IV: Creatinine baseline: 1.6--1.9 Chronic diastolic heart failure grade 1 Recover reduce chronic heart failure Hold lasix, poor oral intake.    Paroxysmal A-fib: -Continue with Eliquis.   Hyperlipidemia: Not on meds.  Follow up outpatient.   See wound care documentation below Pressure Injury 03/17/23 Buttocks Right Stage 2 -  Partial thickness loss of dermis presenting as a shallow open injury with a red, pink wound bed without slough. (Active)  03/17/23 2000  Location: Buttocks  Location Orientation: Right  Staging: Stage 2 -  Partial thickness loss of dermis presenting as a shallow open injury with a red, pink wound bed without slough.  Wound Description (Comments):   Present on Admission: Yes  Dressing Type Foam - Lift dressing to assess site every shift 03/19/23 2016     Pressure Injury 03/17/23 Sacrum Mid Stage 2 -  Partial thickness loss of dermis presenting as a  shallow open injury with a red, pink wound bed without slough. (Active)  03/17/23 2000  Location: Sacrum  Location Orientation:  Mid  Staging: Stage 2 -  Partial thickness loss of dermis presenting as a shallow open injury with a red, pink wound bed without slough.  Wound Description (Comments):   Present on Admission: Yes  Dressing Type Foam - Lift dressing to assess site every shift 03/19/23 2016     Pressure Injury 03/17/23 Vertebral column Lower;Mid Stage 1 -  Intact skin with non-blanchable redness of a localized area usually over a bony prominence. red ublanchable wound to spine X2 (2x1 and 1x1) (Active)  03/17/23 2000  Location: Vertebral column  Location Orientation: Lower;Mid  Staging: Stage 1 -  Intact skin with non-blanchable redness of a localized area usually over a bony prominence.  Wound Description (Comments): red ublanchable wound to spine X2 (2x1 and 1x1)  Present on Admission: Yes  Dressing Type Foam - Lift dressing to assess site every shift 03/19/23 2016       Estimated body mass index is 22.71 kg/m as calculated from the following:   Height as of this encounter: 5\' 5"  (1.651 m).   Weight as of this encounter: 61.9 kg.   DVT prophylaxis: eliquis Code Status: DNR Family Communication: Husband at bedside.  Disposition Plan:  Status is: Inpatient Remains inpatient appropriate because: management of encephalopathy. Compression fracture Poor prognosis.    Consultants:  Palliative care  Procedures:  none Antimicrobials:    Subjective: Sleepy, wake up to voice. Report back pain today 6/10--yesterday was 10/10 Per husband she has been on and off sleepy  We discussed ECHO results, limitation in treatment due to CKD  Objective: Vitals:   03/19/23 1041 03/19/23 1402 03/19/23 2007 03/20/23 0453  BP: 123/68 125/72 (!) 141/85 (!) 142/87  Pulse: 87 86 (!) 104 (!) 103  Resp:  18    Temp: 98.6 F (37 C) 98.3 F (36.8 C)    TempSrc: Axillary Oral    SpO2: 100% 97% 100% 100%  Weight:    61.9 kg  Height:        Intake/Output Summary (Last 24 hours) at 03/20/2023 0801 Last data  filed at 03/19/2023 2245 Gross per 24 hour  Intake 180 ml  Output 850 ml  Net -670 ml   Filed Weights   03/18/23 0500 03/19/23 0500 03/20/23 0453  Weight: 62.8 kg 59 kg 61.9 kg    Examination:  General exam: Alert Respiratory system: CTA Cardiovascular system: S1,S 2 RRR Gastrointestinal system: BS present, soft, nt Central nervous system: Sleepy Extremities: no edema   Data Reviewed: I have personally reviewed following labs and imaging studies  CBC: Recent Labs  Lab 03/17/23 1220 03/18/23 0507  WBC 6.6 6.1  NEUTROABS 4.3  --   HGB 13.2 11.8*  HCT 40.5 37.3  MCV 94.4 95.9  PLT 324 285   Basic Metabolic Panel: Recent Labs  Lab 03/17/23 1220 03/18/23 0507 03/19/23 0518 03/20/23 0519  NA 137 137 134* 135  K 4.0 4.4 3.9 4.0  CL 104 105 99 102  CO2 23 22 25 25   GLUCOSE 91 80 97 100*  BUN 35* 33* 30* 26*  CREATININE 2.21* 2.04* 1.78* 1.56*  CALCIUM 9.2 8.8* 8.7* 8.7*   GFR: Estimated Creatinine Clearance: 22.4 mL/min (A) (by C-G formula based on SCr of 1.56 mg/dL (H)). Liver Function Tests: Recent Labs  Lab 03/17/23 1220 03/18/23 0507  AST 16 14*  ALT 20 17  ALKPHOS 81  69  BILITOT 1.0 0.7  PROT 7.5 6.3*  ALBUMIN 3.7 3.0*   No results for input(s): "LIPASE", "AMYLASE" in the last 168 hours. Recent Labs  Lab 03/18/23 1050  AMMONIA 11   Coagulation Profile: Recent Labs  Lab 03/17/23 1220  INR 2.0*   Cardiac Enzymes: No results for input(s): "CKTOTAL", "CKMB", "CKMBINDEX", "TROPONINI" in the last 168 hours. BNP (last 3 results) No results for input(s): "PROBNP" in the last 8760 hours. HbA1C: No results for input(s): "HGBA1C" in the last 72 hours. CBG: Recent Labs  Lab 03/19/23 1612 03/19/23 2008 03/19/23 2336 03/20/23 0455 03/20/23 0747  GLUCAP 122* 136* 115* 105* 88   Lipid Profile: No results for input(s): "CHOL", "HDL", "LDLCALC", "TRIG", "CHOLHDL", "LDLDIRECT" in the last 72 hours. Thyroid Function Tests: Recent Labs     03/18/23 1050  TSH 4.397   Anemia Panel: No results for input(s): "VITAMINB12", "FOLATE", "FERRITIN", "TIBC", "IRON", "RETICCTPCT" in the last 72 hours. Sepsis Labs: Recent Labs  Lab 03/17/23 1437  LATICACIDVEN 1.1    Recent Results (from the past 240 hours)  Blood Culture (routine x 2)     Status: None (Preliminary result)   Collection Time: 03/17/23 12:15 PM   Specimen: BLOOD  Result Value Ref Range Status   Specimen Description   Final    BLOOD RIGHT ANTECUBITAL Performed at [facility], [address], [place], [place] [address]    Special Requests   Final    BOTTLES DRAWN AEROBIC AND ANAEROBIC Blood Culture results may not be optimal due to an inadequate volume of blood received in culture bottles Performed at [facility], [address], [place], [place] [address]    Culture   Final    NO GROWTH 2 DAYS Performed at [facility], [address], [place], [place] [address]    Report Status PENDING  Incomplete  Blood Culture (routine x 2)     Status: None (Preliminary result)   Collection Time: 03/17/23 12:20 PM   Specimen: BLOOD  Result Value Ref Range Status   Specimen Description   Final    BLOOD BLOOD LEFT ARM Performed at [facility], [address], [place], [place] [address]    Special Requests   Final    BOTTLES DRAWN AEROBIC AND ANAEROBIC Blood Culture results may not be optimal due to an inadequate volume of blood received in culture bottles Performed at [facility], [address], [place], [place] [address]    Culture   Final    NO GROWTH 2 DAYS Performed at [facility], [address], [place], [place] [address]    Report Status PENDING  Incomplete  Resp panel by RT-PCR (RSV, Flu A&B, Covid) Anterior Nasal Swab     Status: None   Collection Time: 03/17/23 12:22 PM   Specimen: Anterior Nasal Swab  Result Value Ref Range Status   SARS Coronavirus 2 by RT PCR  NEGATIVE NEGATIVE Final    Comment: (NOTE) SARS-CoV-2 target nucleic acids are NOT DETECTED.  The SARS-CoV-2 RNA is generally detectable in upper respiratory specimens during the acute phase of infection. The lowest concentration of SARS-CoV-2 viral copies this assay can detect is 138 copies/mL. A negative result does not preclude SARS-Cov-2 infection and should not be used as the sole basis for treatment or other patient management decisions. A negative result may occur with  improper specimen collection/handling, submission of specimen other than nasopharyngeal swab, presence of viral  mutation(s) within the areas targeted by this assay, and inadequate number of viral copies(<138 copies/mL). A negative result must be combined with clinical observations, patient history, and epidemiological information. The expected result is Negative.  Fact Sheet for Patients:  BloggerCourse.com  Fact Sheet for Healthcare Providers:  SeriousBroker.it  This test is no t yet approved or cleared by the [place] FDA and  has been authorized for detection and/or diagnosis of SARS-CoV-2 by FDA under an Emergency Use Authorization (EUA). This EUA will remain  in effect (meaning this test can be used) for the duration of the COVID-19 declaration under Section 564(b)(1) of the Act, 21 U.S.C.section 360bbb-3(b)(1), unless the authorization is terminated  or revoked sooner.       Influenza A by PCR NEGATIVE NEGATIVE Final   Influenza B by PCR NEGATIVE NEGATIVE Final    Comment: (NOTE) The Xpert Xpress SARS-CoV-2/FLU/RSV plus assay is intended as an aid in the diagnosis of influenza from Nasopharyngeal swab specimens and should not be used as a sole basis for treatment. Nasal washings and aspirates are unacceptable for Xpert Xpress SARS-CoV-2/FLU/RSV testing.  Fact Sheet for Patients: BloggerCourse.com  Fact Sheet for  Healthcare Providers: SeriousBroker.it  This test is not yet approved or cleared by the [place] FDA and has been authorized for detection and/or diagnosis of SARS-CoV-2 by FDA under an Emergency Use Authorization (EUA). This EUA will remain in effect (meaning this test can be used) for the duration of the COVID-19 declaration under Section 564(b)(1) of the Act, 21 U.S.C. section 360bbb-3(b)(1), unless the authorization is terminated or revoked.     Resp Syncytial Virus by PCR NEGATIVE NEGATIVE Final    Comment: (NOTE) Fact Sheet for Patients: BloggerCourse.com  Fact Sheet for Healthcare Providers: SeriousBroker.it  This test is not yet approved or cleared by the [place] FDA and has been authorized for detection and/or diagnosis of SARS-CoV-2 by FDA under an Emergency Use Authorization (EUA). This EUA will remain in effect (meaning this test can be used) for the duration of the COVID-19 declaration under Section 564(b)(1) of the Act, 21 U.S.C. section 360bbb-3(b)(1), unless the authorization is terminated or revoked.  Performed at [facility], [address], [place], [place] [address]          Radiology Studies: ECHOCARDIOGRAM COMPLETE Result Date: 03/19/2023    ECHOCARDIOGRAM REPORT   Patient Name:   Krystal Henderson Date of Exam: 03/19/2023 Medical Rec #:  [id number]         Height:       65.0 in Accession #:    [id number]        Weight:       130.1 lb Date of Birth:  05-15-1932          BSA:          1.648 m Patient Age:    89 years          BP:           120/68 mmHg Patient Gender: F                 HR:           111 bpm. Exam Location:  Inpatient Procedure: 2D Echo, Cardiac Doppler and Color Doppler Indications:    CHF Acute Diastolic  History:        Patient has prior history of Echocardiogram examinations, most  recent 07/23/2021. Arrythmias:Atrial  Fibrillation; Risk                 Factors:Hypertension and Dyslipidemia.  Sonographer:    Karma Ganja Referring Phys: [id number] DAVID MANUEL ORTIZ  Sonographer Comments: Image acquisition challenging due to patient body habitus and Image acquisition challenging due to uncooperative patient. IMPRESSIONS  1. Left ventricular ejection fraction, by estimation, is 30 to 35%. The left ventricle has moderately decreased function. The left ventricle demonstrates global hypokinesis. Left ventricular diastolic function could not be evaluated. Elevated left atrial pressure. The E/e' is 21.  2. Right ventricular systolic function is low normal. The right ventricular size is mildly enlarged. There is severely elevated pulmonary artery systolic pressure. The estimated right ventricular systolic pressure is 68.3 mmHg.  3. Left atrial size was severely dilated.  4. Right atrial size was moderately dilated.  5. The mitral valve is normal in structure. Severe mitral valve regurgitation. No evidence of mitral stenosis.  6. Tricuspid valve regurgitation is moderate.  7. The aortic valve is normal in structure. Aortic valve regurgitation is moderate. No aortic stenosis is present.  8. There is dilatation of the ascending aorta, measuring 40 mm.  9. The inferior vena cava is dilated in size with <50% respiratory variability, suggesting right atrial pressure of 15 mmHg. Comparison(s): A prior study was performed on 07/2021. LVEF has reduced from 50-55% to 30-35%, MR and TR have progressed, and estimated RAP now increased to . FINDINGS  Left Ventricle: Left ventricular ejection fraction, by estimation, is 30 to 35%. The left ventricle has moderately decreased function. The left ventricle demonstrates global hypokinesis. The left ventricular internal cavity size was normal in size. There is borderline left ventricular hypertrophy. Left ventricular diastolic function could not be evaluated due to atrial fibrillation. Left ventricular  diastolic function could not be evaluated. Elevated left atrial pressure. The E/e' is 21. Right Ventricle: The right ventricular size is mildly enlarged. Right vetricular wall thickness was not well visualized. Right ventricular systolic function is low normal. There is severely elevated pulmonary artery systolic pressure. The tricuspid regurgitant velocity is 3.65 m/s, and with an assumed right atrial pressure of 15 mmHg, the estimated right ventricular systolic pressure is 68.3 mmHg. Left Atrium: Left atrial size was severely dilated. Right Atrium: Right atrial size was moderately dilated. Pericardium: There is no evidence of pericardial effusion. Mitral Valve: The mitral valve is normal in structure. Severe mitral valve regurgitation. No evidence of mitral valve stenosis. Tricuspid Valve: The tricuspid valve is normal in structure. Tricuspid valve regurgitation is moderate . No evidence of tricuspid stenosis. Aortic Valve: The aortic valve is normal in structure. Aortic valve regurgitation is moderate. Aortic regurgitation PHT measures 384 msec. No aortic stenosis is present. Aortic valve mean gradient measures 3.0 mmHg. Aortic valve peak gradient measures 5.7 mmHg. Aortic valve area, by VTI measures 1.80 cm. Pulmonic Valve: The pulmonic valve was normal in structure. Pulmonic valve regurgitation is trivial. No evidence of pulmonic stenosis. Aorta: The aortic root is normal in size and structure. There is dilatation of the ascending aorta, measuring 40 mm. Venous: The inferior vena cava is dilated in size with less than 50% respiratory variability, suggesting right atrial pressure of 15 mmHg. IAS/Shunts: The interatrial septum was not well visualized.  LEFT VENTRICLE PLAX 2D LVIDd:         4.70 cm     Diastology LVIDs:         3.90 cm     LV e' medial:  4.71 cm/s LV PW:         1.20 cm     LV E/e' medial:  28.7 LV IVS:        1.00 cm     LV e' lateral:   10.40 cm/s LVOT diam:     2.00 cm     LV E/e' lateral:  13.0 LV SV:         38 LV SV Index:   23 LVOT Area:     3.14 cm  LV Volumes (MOD) LV vol d, MOD A2C: 85.9 ml LV vol d, MOD A4C: 83.0 ml LV vol s, MOD A2C: 56.8 ml LV vol s, MOD A4C: 50.4 ml LV SV MOD A2C:     29.1 ml LV SV MOD A4C:     83.0 ml LV SV MOD BP:      33.3 ml RIGHT VENTRICLE            IVC RV Basal diam:  4.10 cm    IVC diam: 2.40 cm RV S prime:     8.61 cm/s TAPSE (M-mode): 1.6 cm LEFT ATRIUM              Index        RIGHT ATRIUM           Index LA diam:        5.00 cm  3.03 cm/m   RA Area:     23.40 cm LA Vol (A2C):   82.7 ml  50.19 ml/m  RA Volume:   75.00 ml  45.52 ml/m LA Vol (A4C):   107.0 ml 64.94 ml/m LA Biplane Vol: 97.6 ml  59.24 ml/m  AORTIC VALVE AV Area (Vmax):    1.69 cm AV Area (Vmean):   1.68 cm AV Area (VTI):     1.80 cm AV Vmax:           119.00 cm/s AV Vmean:          79.133 cm/s AV VTI:            0.212 m AV Peak Grad:      5.7 mmHg AV Mean Grad:      3.0 mmHg LVOT Vmax:         64.03 cm/s LVOT Vmean:        42.200 cm/s LVOT VTI:          0.121 m LVOT/AV VTI ratio: 0.57 AI PHT:            384 msec  AORTA Ao Root diam: 3.30 cm Ao Asc diam:  4.00 cm MITRAL VALVE                TRICUSPID VALVE MV Area (PHT): 5.59 cm     TR Peak grad:   53.3 mmHg MV Decel Time: 136 msec     TR Vmax:        365.00 cm/s MR Peak grad: 119.8 mmHg MR Mean grad: 73.3 mmHg     SHUNTS MR Vmax:      547.33 cm/s   Systemic VTI:  0.12 m MR Vmean:     396.3 cm/s    Systemic Diam: 2.00 cm MV E velocity: 135.33 cm/s Sunit Tolia Electronically signed by Tessa Lerner Signature Date/Time: 03/19/2023/12:40:00 PM    Final         Scheduled Meds:  acetaminophen  1,000 mg Oral TID   apixaban  2.5 mg Oral BID   vitamin C  1,000 mg Oral Daily  calcitonin (salmon)  1 spray Alternating Nares Daily   cyanocobalamin  1,000 mcg Intramuscular Daily   famotidine  10 mg Oral Daily   feeding supplement  1 Container Oral TID BM   lidocaine  1 patch Transdermal Q24H   megestrol  160 mg Oral Daily   pregabalin   50 mg Oral QHS   senna-docusate  1 tablet Oral BID   Continuous Infusions:  cefTRIAXone (ROCEPHIN)  IV 2 g (03/19/23 1153)   thiamine (VITAMIN B1) injection 500 mg (03/19/23 1056)     LOS: 3 days    Time spent: 35 minutes    Jayle Solarz A Issabella Rix, MD [facility]   If 7PM-7AM, please contact night-coverage [email or web address]  03/20/2023, 8:01 AM

## 2023-03-21 DIAGNOSIS — I5041 Acute combined systolic (congestive) and diastolic (congestive) heart failure: Secondary | ICD-10-CM | POA: Diagnosis not present

## 2023-03-21 DIAGNOSIS — R6520 Severe sepsis without septic shock: Secondary | ICD-10-CM

## 2023-03-21 DIAGNOSIS — L8990 Pressure ulcer of unspecified site, unspecified stage: Secondary | ICD-10-CM | POA: Insufficient documentation

## 2023-03-21 DIAGNOSIS — A419 Sepsis, unspecified organism: Secondary | ICD-10-CM | POA: Diagnosis not present

## 2023-03-21 DIAGNOSIS — G9341 Metabolic encephalopathy: Secondary | ICD-10-CM | POA: Diagnosis not present

## 2023-03-21 LAB — BASIC METABOLIC PANEL
Anion gap: 7 (ref 5–15)
BUN: 27 mg/dL — ABNORMAL HIGH (ref 8–23)
CO2: 26 mmol/L (ref 22–32)
Calcium: 8.9 mg/dL (ref 8.9–10.3)
Chloride: 103 mmol/L (ref 98–111)
Creatinine, Ser: 1.4 mg/dL — ABNORMAL HIGH (ref 0.44–1.00)
GFR, Estimated: 36 mL/min — ABNORMAL LOW (ref >60)
Glucose, Bld: 114 mg/dL — ABNORMAL HIGH (ref 70–99)
Potassium: 4.1 mmol/L (ref 3.5–5.1)
Sodium: 136 mmol/L (ref 135–145)

## 2023-03-21 LAB — CBC
HCT: 37.1 % (ref 36.0–46.0)
Hemoglobin: 12.1 g/dL (ref 12.0–15.0)
MCH: 30.7 pg (ref 26.0–34.0)
MCHC: 32.6 g/dL (ref 30.0–36.0)
MCV: 94.2 fL (ref 80.0–100.0)
Platelets: 327 10*3/uL (ref 150–400)
RBC: 3.94 MIL/uL (ref 3.87–5.11)
RDW: 15.6 % — ABNORMAL HIGH (ref 11.5–15.5)
WBC: 7.4 10*3/uL (ref 4.0–10.5)
nRBC: 0 % (ref 0.0–0.2)

## 2023-03-21 LAB — PROCALCITONIN: Procalcitonin: <0.1 ng/mL

## 2023-03-21 LAB — GLUCOSE, CAPILLARY
Glucose-Capillary: 108 mg/dL — ABNORMAL HIGH (ref 70–99)
Glucose-Capillary: 100 mg/dL — ABNORMAL HIGH (ref 70–99)
Glucose-Capillary: 122 mg/dL — ABNORMAL HIGH (ref 70–99)
Glucose-Capillary: 95 mg/dL (ref 70–99)
Glucose-Capillary: 112 mg/dL — ABNORMAL HIGH (ref 70–99)

## 2023-03-21 MED ORDER — FUROSEMIDE 10 MG/ML IJ SOLN
40.0000 mg | Freq: Two times a day (BID) | INTRAMUSCULAR | Status: DC
  Administered 2023-03-21 – 2023-03-22 (×3): 40 mg via INTRAVENOUS
  Filled 2023-03-21 (×3): qty 4

## 2023-03-21 MED ORDER — MELATONIN 5 MG PO TABS
5.0000 mg | ORAL_TABLET | Freq: Every evening | ORAL | Status: DC | PRN
  Administered 2023-03-21: 5 mg via ORAL
  Filled 2023-03-21: qty 1

## 2023-03-21 MED ORDER — METOPROLOL SUCCINATE ER 50 MG PO TB24
25.0000 mg | ORAL_TABLET | Freq: Every day | ORAL | Status: DC
  Administered 2023-03-21 – 2023-03-22 (×2): 25 mg via ORAL
  Filled 2023-03-21 (×2): qty 1

## 2023-03-21 MED ORDER — OXYCODONE HCL 5 MG PO TABS: 2.5000 mg | ORAL_TABLET | Freq: Once | ORAL | Status: DC | PRN

## 2023-03-21 NOTE — Progress Notes (Signed)
Occupational Therapy Treatment Patient Details Name: Krystal Henderson MRN: [id number] DOB: July 04, 1932 Today's Date: 03/21/2023   History of present illness Pt is 89 yo female presented on 03/17/23 with AMS.  Pt with acute metabolic encephalopathy in setting of cellulitis of LE.   Pt recently in ED on 12/6 with compression fx T12 and chronic L1.  At that time neurosurgery (Dr Lovell Sheehan) recommended lumbar corset per ED note.  Spouse reports brace did not help or fit properly (pt kyphotic) and pt not wearing at home.  Pt with hx including but not limited to anemia, angiodysplasia of the colon, anxiety, chronic lower back pain, diverticulosis, colon polyps, degenerative joint disease, GERD, history of vertebral fracture, hypertension, hyperlipidemia, reactive hypoglycemia, pelvic fracture, peripheral neuropathy, vitamin B12 deficiency   OT comments  Patient was noted to have decreased activity tolerance with patient reporting increased back pain with advancement to EOB with education provided on log rolling technique. Patient was able to maintain sitting balance with noted lean to L side when no UE support was used with mod A to maintain balance. Patient will benefit from continued inpatient follow up therapy, <3 hours/day. Patient's discharge plan remains appropriate at this time. OT will continue to follow acutely.        If plan is discharge home, recommend the following:  A lot of help with walking and/or transfers;A lot of help with bathing/dressing/bathroom;Two people to help with bathing/dressing/bathroom;Two people to help with walking and/or transfers   Equipment Recommendations  None recommended by OT       Precautions / Restrictions Precautions Precautions: Fall;Back Precaution Comments: Performed tx with back precautions; reviewed log roll technique with pt/spouse Restrictions Weight Bearing Restrictions Per Provider Order: No       Mobility Bed Mobility Overal bed mobility:  Needs Assistance   Rolling: Mod assist Sidelying to sit: Max assist     Sit to sidelying: Max assist General bed mobility comments: with log rolling education provided.         Balance Overall balance assessment: Needs assistance Sitting-balance support: Feet supported, Bilateral upper extremity supported Sitting balance-Leahy Scale: Poor   Postural control: Left lateral lean           ADL either performed or assessed with clinical judgement   ADL Overall ADL's : Needs assistance/impaired       Grooming Details (indicate cue type and reason): patient declined to engage in grooming tasks siting EOB reporting "my husband brushed my teeth for me already". patient was educated on importance of participation in grooming tasks herself as much as possible to reduce caregiver burden and prevent learned helplessness. patient verbalized understanding.                   Toilet Transfer Details (indicate cue type and reason): patient was max A to advance to EOB and back into bed.           General ADL Comments: patient was noted to have lateral lean to L side sitting EOB with increased time. patient quick to fatigue with minimal movement.      Cognition Arousal: Alert Behavior During Therapy: Flat affect Overall Cognitive Status: Impaired/Different from baseline       General Comments: patient following basic commands during session.                   Pertinent Vitals/ Pain       Pain Assessment Pain Assessment: Faces Faces Pain Scale: Hurts whole lot Pain Location: back  with bed mobility Pain Descriptors / Indicators: Grimacing, Discomfort         Frequency  Min 2X/week        Progress Toward Goals  OT Goals(current goals can now be found in the care plan section)  Progress towards OT goals: OT to reassess next treatment     Plan         AM-PAC OT "6 Clicks" Daily Activity     Outcome Measure   Help from another person eating meals?: A  Lot Help from another person taking care of personal grooming?: A Lot Help from another person toileting, which includes using toliet, bedpan, or urinal?: Total Help from another person bathing (including washing, rinsing, drying)?: A Lot Help from another person to put on and taking off regular upper body clothing?: A Lot Help from another person to put on and taking off regular lower body clothing?: Total 6 Click Score: 10    End of Session    OT Visit Diagnosis: Unsteadiness on feet (R26.81);Muscle weakness (generalized) (M62.81);History of falling (Z91.81)   Activity Tolerance Patient limited by fatigue   Patient Left in bed;with call bell/phone within reach;with bed alarm set   Nurse Communication Mobility status        Time: 4010-2725 OT Time Calculation (min): 12 min  Charges: OT General Charges $OT Visit: 1 Visit OT Treatments $Self Care/Home Management : 8-22 mins  Rosalio Loud, MS Acute Rehabilitation Department Office# [phone]   Selinda Flavin 03/21/2023, 4:32 PM

## 2023-03-21 NOTE — Consult Note (Signed)
Cardiology Consultation   Patient ID: Krystal RENTAS MRN: [id number]; DOB: 10-Sep-1932  Admit date: 03/17/2023 Date of Consult: 03/21/2023  PCP:  Donato Schultz, DO   [facility] Providers Cardiologist:  Will Jorja Loa, MD   {  Patient Profile:   Krystal Henderson is a 89 y.o. female with a pertinent hx of chronic HFpEF, PAF, PE 2021, CKD, deconditioning, HTN, angiodysplasia of the colon, chronic lower back pain, degenerative joint disease, GERD vertebral fracture  anemia, peripheral neuropathy who is being seen 03/21/2023 for the evaluation of new HFrEF at the request of Dr. Doristine Mango.  History of Present Illness:   [name] was seen by Dr. Gala Romney back in 2011 and 2012 for chronic HFpEF.  Previous Myoview with trivial anterior ischemia.  Underwent cath 2011 that showed normal coronary anatomy.RA 2 RV 25/0, PA 23/7 (15), PCWP 5. Central aortic pressure was 145/69 with a mean of 99.  LV pressure 145/12 with an LVEDP of 10-15.  Fick cardiac output was 5.7, cardiac index 3.0.  Pulmonary vascular resistance was 1.7 Woods units.  She also has been followed by Dr. Elberta Fortis since 2021 for atrial fibrillation and had PE in August same year.  Started on metoprolol.  Previous cardiac monitor in 2022 showing 5% atrial fibrillation burden.  Previously on metoprolol however stopped due to fatigue and transition to diltiazem 120 mg.  She was last seen in October 2024 by EP reported to have been doing fairly well.  Reports that nephrology was managing her Lasix and had reduced it to Monday Wednesday Friday due to rapid weight loss and incontinence.  Does not appear she was on any rate lowering medications at that appointment.  Also had an admission last month for acute on chronic HFpEF exacerbation, she was given 1 dose of IV Lasix and discharged home with recommendations to follow-up with cardiologist.    Currently patient being evaluated for AMS, back pain secondary to compression  fracture, weakness and cellulitis.  She was admitted on 03/17/2023.  CT of the head did not show any acute abnormalities.  Cardiology asked due to newly reduced EF 30-35% with no regional wall motion abnormalities.  She has been started on ceftriaxone, her Lasix was stopped today, due to poor p.o. intake.  Palliative care has also been involved for her back pain pain.  Patient is still very confused however is accompanied with her husband today.  He notes that ever since her compression fracture that she has had progressive decline in cognitive function and the weakness.  He states that her current presentation is very atypical for her as she is normally on a house.  He denied patient complaining of any shortness of breath, chest pain, orthopnea, worsening peripheral edema.  Back pain seems to be the biggest concern currently.  Although patient is confused, she did state that she wanted to die felt like she was a burden to her husband.   Chest x-ray showing mild cardiomegaly and mild pulmonary vascular congestion.  BNP 1035.  No new labs today.  Yesterday renal function 1.56 and had been improving from 2.21 on admission.  Normal TSH.  Normal ammonia.  Normal lactic acid.  Negative respiratory panel    Past Medical History:  Diagnosis Date   Anemia    hx of    Angiodysplasia 2007   @ colonoscopy   Anxiety    PMH of   Chronic low back pain 06/21/2014   Diverticulosis of colon (without mention of  hemorrhage)    DJD (degenerative joint disease)    Esophageal reflux    inactive   History of vertebral fracture 11/2015   HTN (hypertension)    Hyperlipemia 2006   LDL 130   Hypoglycemia    reactive   Pelvic fracture (HCC) 12/30/2011   [facility]   Peripheral neuropathy    Personal history of colonic polyps    adenomatous   Vitamin B12 deficiency     Past Surgical History:  Procedure Laterality Date   cataract surgery  12-26-12 and 01-09-13   COLONOSCOPY  2011   neg   COLONOSCOPY W/  POLYPECTOMY     X 2 , Dr  Sheryn Bison; angiodysplasia. Due 2022   DILATION AND CURETTAGE OF UTERUS     FACIAL COSMETIC SURGERY     HEMORROIDECTOMY     LUMBAR LAMINECTOMY/DECOMPRESSION MICRODISCECTOMY  09/10/2011   Procedure: LUMBAR LAMINECTOMY/DECOMPRESSION MICRODISCECTOMY;  Surgeon: Javier Docker, MD;  Location: [facility];  Service: Orthopedics;  Laterality: N/A;  decompression laminectomy L2-3, L3-4, L4-5   ORIF WRIST FRACTURE  01/11/2012   Procedure: OPEN REDUCTION INTERNAL FIXATION (ORIF) WRIST FRACTURE;  Surgeon: Tami Ribas, MD;  Location: [facility];  Service: Orthopedics;  Laterality: Right;   TEAR DUCT PROBING     X 2   TOTAL HIP ARTHROPLASTY  2000   right   TUBAL LIGATION      Inpatient Medications: Scheduled Meds:  acetaminophen  1,000 mg Oral TID   apixaban  2.5 mg Oral BID   vitamin C  1,000 mg Oral Daily   calcitonin (salmon)  1 spray Alternating Nares Daily   cyanocobalamin  1,000 mcg Intramuscular Daily   famotidine  10 mg Oral Daily   feeding supplement  1 Container Oral TID BM   furosemide  40 mg Intravenous BID   lidocaine  1 patch Transdermal Q24H   megestrol  160 mg Oral Daily   pregabalin  50 mg Oral QHS   senna-docusate  1 tablet Oral BID   Continuous Infusions:  cefTRIAXone (ROCEPHIN)  IV 2 g (03/20/23 1202)   thiamine (VITAMIN B1) injection 500 mg (03/21/23 1022)   PRN Meds: albuterol, HYDROmorphone, polyethylene glycol  Allergies:    Allergies  Allergen Reactions   Codeine Nausea Only   Duloxetine Other (See Comments)    REACTION: intolerance--She does not remember taking this Rx- states she did not feel well    Sertraline Hcl Other (See Comments)    REACTION: intolerance-- Did not help with Depression    Social History:   Social History   Socioeconomic History   Marital status: Married    Spouse name: Peyton Najjar   Number of children: 1   Years of education: hs   Highest education level: 12th grade  Occupational History    Occupation: retired    Associate Professor: RETIRED  Tobacco Use   Smoking status: Never   Smokeless tobacco: Never  Substance and Sexual Activity   Alcohol use: Yes    Alcohol/week: 3.0 standard drinks of alcohol    Types: 3 Glasses of wine per week    Comment: wine occasionally   Drug use: No   Sexual activity: Yes    Birth control/protection: Diaphragm  Other Topics Concern   Not on file  Social History Narrative   Patient is right handed.   Patient does not drink caffeine.   Lives with husband   Lucila [place] lives nearby   Social Drivers of Health   Financial Resource Strain:  Low Risk  (02/22/2023)   Overall Financial Resource Strain (CARDIA)    Difficulty of Paying Living Expenses: Not hard at all  Food Insecurity: No Food Insecurity (03/17/2023)   Hunger Vital Sign    Worried About Running Out of Food in the Last Year: Never true    Ran Out of Food in the Last Year: Never true  Transportation Needs: No Transportation Needs (03/17/2023)   PRAPARE - Administrator, Civil Service (Medical): No    Lack of Transportation (Non-Medical): No  Physical Activity: Unknown (02/22/2023)   Exercise Vital Sign    Days of Exercise per Week: 0 days    Minutes of Exercise per Session: Not on file  Stress: No Stress Concern Present (02/22/2023)   [employer] of Occupational Health - Occupational Stress Questionnaire    Feeling of Stress : Only a little  Social Connections: Moderately Isolated (02/22/2023)   Social Connection and Isolation Panel [NHANES]    Frequency of Communication with Friends and Family: Twice a week    Frequency of Social Gatherings with Friends and Family: Once a week    Attends Religious Services: Never    Database administrator or Organizations: No    Attends Engineer, structural: Not on file    Marital Status: Married  Catering manager Violence: Not At Risk (03/17/2023)   Humiliation, Afraid, Rape, and Kick questionnaire    Fear of Current or  Ex-Partner: No    Emotionally Abused: No    Physically Abused: No    Sexually Abused: No    Family History:   Family History  Problem Relation Age of Onset   Heart attack Father 1   Kidney disease Mother        renal failure   Throat cancer Sister        smoker   Osteoporosis Sister    Heart attack Brother 18   Stroke Brother 57       smoker   Depression Maternal Uncle    Cancer Brother        X3  lung cancer, all smokers   Peripheral vascular disease Daughter    Colon cancer Neg Hx    Diabetes Neg Hx      ROS:  Please see the history of present illness.  All other ROS reviewed and negative.     Physical Exam/Data:   Vitals:   03/20/23 0453 03/20/23 1428 03/20/23 2017 03/21/23 0541  BP: (!) 142/87 129/80 (!) 140/84 131/75  Pulse: (!) 103 92 86 86  Resp:  18 19 16   Temp:  (!) 97.5 F (36.4 C) 97.8 F (36.6 C) (!) 97.5 F (36.4 C)  TempSrc:  Oral Oral Oral  SpO2: 100% 98% 98% 93%  Weight: 61.9 kg   61 kg  Height:        Intake/Output Summary (Last 24 hours) at 03/21/2023 1046 Last data filed at 03/20/2023 2226 Gross per 24 hour  Intake 607 ml  Output 300 ml  Net 307 ml      03/21/2023    5:41 AM 03/20/2023    4:53 AM 03/19/2023    5:00 AM  Last 3 Weights  Weight (lbs) 134 lb 7.7 oz 136 lb 7.4 oz 130 lb 1.1 oz  Weight (kg) 61 kg 61.9 kg 59 kg     Body mass index is 22.38 kg/m.  General: Deconditioned, AMS  HEENT: normal Neck: significant JVD Vascular: No carotid bruits; Distal pulses 2+ bilaterally Cardiac:  irregularly irregular Lungs: Diffuse crackles Abd: soft, nontender, no hepatomegaly  Ext: Right leg cellulitis, no edema  Musculoskeletal:  No deformities, BUE and BLE strength normal and equal Skin: warm and dry  Neuro:  CNs 2-12 intact, no focal abnormalities noted Psych: Confused  EKG:  The EKG was personally reviewed and demonstrates: Atrial fibrillation heart rate 115. Telemetry:  Telemetry was personally reviewed and demonstrates:  Atrial fibrillation heart rates in the 80s to 90s.  Relevant CV Studies: Echocardiogram 03/19/2023 1. Left ventricular ejection fraction, by estimation, is 30 to 35%. The  left ventricle has moderately decreased function. The left ventricle  demonstrates global hypokinesis. Left ventricular diastolic function could  not be evaluated. Elevated left  atrial pressure. The E/e' is 21.   2. Right ventricular systolic function is low normal. The right  ventricular size is mildly enlarged. There is severely elevated pulmonary  artery systolic pressure. The estimated right ventricular systolic  pressure is 68.3 mmHg.   3. Left atrial size was severely dilated.   4. Right atrial size was moderately dilated.   5. The mitral valve is normal in structure. Severe mitral valve  regurgitation. No evidence of mitral stenosis.   6. Tricuspid valve regurgitation is moderate.   7. The aortic valve is normal in structure. Aortic valve regurgitation is  moderate. No aortic stenosis is present.   8. There is dilatation of the ascending aorta, measuring 40 mm.   9. The inferior vena cava is dilated in size with <50% respiratory  variability, suggesting right atrial pressure of 15 mmHg.   Comparison(s): A prior study was performed on 07/2021. LVEF has reduced  from 50-55% to 30-35%, MR and TR have progressed, and estimated RAP now  increased to .    Laboratory Data:  High Sensitivity Troponin:  No results for input(s): "TROPONINIHS" in the last 720 hours.   Chemistry Recent Labs  Lab 03/18/23 0507 03/19/23 0518 03/20/23 0519  NA 137 134* 135  K 4.4 3.9 4.0  CL 105 99 102  CO2 22 25 25   GLUCOSE 80 97 100*  BUN 33* 30* 26*  CREATININE 2.04* 1.78* 1.56*  CALCIUM 8.8* 8.7* 8.7*  GFRNONAA 23* 27* 32*  ANIONGAP 10 10 8     Recent Labs  Lab 03/17/23 1220 03/18/23 0507  PROT 7.5 6.3*  ALBUMIN 3.7 3.0*  AST 16 14*  ALT 20 17  ALKPHOS 81 69  BILITOT 1.0 0.7   Lipids No results for  input(s): "CHOL", "TRIG", "HDL", "LABVLDL", "LDLCALC", "CHOLHDL" in the last 168 hours.  Hematology Recent Labs  Lab 03/17/23 1220 03/18/23 0507  WBC 6.6 6.1  RBC 4.29 3.89  HGB 13.2 11.8*  HCT 40.5 37.3  MCV 94.4 95.9  MCH 30.8 30.3  MCHC 32.6 31.6  RDW 16.2* 16.1*  PLT 324 285   Thyroid  Recent Labs  Lab 03/18/23 1050  TSH 4.397    BNP Recent Labs  Lab 03/17/23 1220  BNP 1,035.9*    DDimer No results for input(s): "DDIMER" in the last 168 hours.   Radiology/Studies:  ECHOCARDIOGRAM COMPLETE Result Date: 03/19/2023    ECHOCARDIOGRAM REPORT   Patient Name:   DOREENE NAGEOTTE Date of Exam: 03/19/2023 Medical Rec #:  [id number]         Height:       65.0 in Accession #:    [id number]        Weight:       130.1 lb Date of Birth:  03-26-1933  BSA:          1.648 m Patient Age:    89 years          BP:           120/68 mmHg Patient Gender: F                 HR:           111 bpm. Exam Location:  Inpatient Procedure: 2D Echo, Cardiac Doppler and Color Doppler Indications:    CHF Acute Diastolic  History:        Patient has prior history of Echocardiogram examinations, most                 recent 07/23/2021. Arrythmias:Atrial Fibrillation; Risk                 Factors:Hypertension and Dyslipidemia.  Sonographer:    Karma Ganja Referring Phys: [id number] DAVID MANUEL ORTIZ  Sonographer Comments: Image acquisition challenging due to patient body habitus and Image acquisition challenging due to uncooperative patient. IMPRESSIONS  1. Left ventricular ejection fraction, by estimation, is 30 to 35%. The left ventricle has moderately decreased function. The left ventricle demonstrates global hypokinesis. Left ventricular diastolic function could not be evaluated. Elevated left atrial pressure. The E/e' is 21.  2. Right ventricular systolic function is low normal. The right ventricular size is mildly enlarged. There is severely elevated pulmonary artery systolic pressure. The estimated right  ventricular systolic pressure is 68.3 mmHg.  3. Left atrial size was severely dilated.  4. Right atrial size was moderately dilated.  5. The mitral valve is normal in structure. Severe mitral valve regurgitation. No evidence of mitral stenosis.  6. Tricuspid valve regurgitation is moderate.  7. The aortic valve is normal in structure. Aortic valve regurgitation is moderate. No aortic stenosis is present.  8. There is dilatation of the ascending aorta, measuring 40 mm.  9. The inferior vena cava is dilated in size with <50% respiratory variability, suggesting right atrial pressure of 15 mmHg. Comparison(s): A prior study was performed on 07/2021. LVEF has reduced from 50-55% to 30-35%, MR and TR have progressed, and estimated RAP now increased to . FINDINGS  Left Ventricle: Left ventricular ejection fraction, by estimation, is 30 to 35%. The left ventricle has moderately decreased function. The left ventricle demonstrates global hypokinesis. The left ventricular internal cavity size was normal in size. There is borderline left ventricular hypertrophy. Left ventricular diastolic function could not be evaluated due to atrial fibrillation. Left ventricular diastolic function could not be evaluated. Elevated left atrial pressure. The E/e' is 21. Right Ventricle: The right ventricular size is mildly enlarged. Right vetricular wall thickness was not well visualized. Right ventricular systolic function is low normal. There is severely elevated pulmonary artery systolic pressure. The tricuspid regurgitant velocity is 3.65 m/s, and with an assumed right atrial pressure of 15 mmHg, the estimated right ventricular systolic pressure is 68.3 mmHg. Left Atrium: Left atrial size was severely dilated. Right Atrium: Right atrial size was moderately dilated. Pericardium: There is no evidence of pericardial effusion. Mitral Valve: The mitral valve is normal in structure. Severe mitral valve regurgitation. No evidence of mitral  valve stenosis. Tricuspid Valve: The tricuspid valve is normal in structure. Tricuspid valve regurgitation is moderate . No evidence of tricuspid stenosis. Aortic Valve: The aortic valve is normal in structure. Aortic valve regurgitation is moderate. Aortic regurgitation PHT measures 384 msec. No aortic stenosis is present. Aortic valve mean gradient measures  3.0 mmHg. Aortic valve peak gradient measures 5.7 mmHg. Aortic valve area, by VTI measures 1.80 cm. Pulmonic Valve: The pulmonic valve was normal in structure. Pulmonic valve regurgitation is trivial. No evidence of pulmonic stenosis. Aorta: The aortic root is normal in size and structure. There is dilatation of the ascending aorta, measuring 40 mm. Venous: The inferior vena cava is dilated in size with less than 50% respiratory variability, suggesting right atrial pressure of 15 mmHg. IAS/Shunts: The interatrial septum was not well visualized.  LEFT VENTRICLE PLAX 2D LVIDd:         4.70 cm     Diastology LVIDs:         3.90 cm     LV e' medial:    4.71 cm/s LV PW:         1.20 cm     LV E/e' medial:  28.7 LV IVS:        1.00 cm     LV e' lateral:   10.40 cm/s LVOT diam:     2.00 cm     LV E/e' lateral: 13.0 LV SV:         38 LV SV Index:   23 LVOT Area:     3.14 cm  LV Volumes (MOD) LV vol d, MOD A2C: 85.9 ml LV vol d, MOD A4C: 83.0 ml LV vol s, MOD A2C: 56.8 ml LV vol s, MOD A4C: 50.4 ml LV SV MOD A2C:     29.1 ml LV SV MOD A4C:     83.0 ml LV SV MOD BP:      33.3 ml RIGHT VENTRICLE            IVC RV Basal diam:  4.10 cm    IVC diam: 2.40 cm RV S prime:     8.61 cm/s TAPSE (M-mode): 1.6 cm LEFT ATRIUM              Index        RIGHT ATRIUM           Index LA diam:        5.00 cm  3.03 cm/m   RA Area:     23.40 cm LA Vol (A2C):   82.7 ml  50.19 ml/m  RA Volume:   75.00 ml  45.52 ml/m LA Vol (A4C):   107.0 ml 64.94 ml/m LA Biplane Vol: 97.6 ml  59.24 ml/m  AORTIC VALVE AV Area (Vmax):    1.69 cm AV Area (Vmean):   1.68 cm AV Area (VTI):     1.80 cm  AV Vmax:           119.00 cm/s AV Vmean:          79.133 cm/s AV VTI:            0.212 m AV Peak Grad:      5.7 mmHg AV Mean Grad:      3.0 mmHg LVOT Vmax:         64.03 cm/s LVOT Vmean:        42.200 cm/s LVOT VTI:          0.121 m LVOT/AV VTI ratio: 0.57 AI PHT:            384 msec  AORTA Ao Root diam: 3.30 cm Ao Asc diam:  4.00 cm MITRAL VALVE                TRICUSPID VALVE MV Area (PHT): 5.59 cm  TR Peak grad:   53.3 mmHg MV Decel Time: 136 msec     TR Vmax:        365.00 cm/s MR Peak grad: 119.8 mmHg MR Mean grad: 73.3 mmHg     SHUNTS MR Vmax:      547.33 cm/s   Systemic VTI:  0.12 m MR Vmean:     396.3 cm/s    Systemic Diam: 2.00 cm MV E velocity: 135.33 cm/s Sunit Tolia Electronically signed by Tessa Lerner Signature Date/Time: 03/19/2023/12:40:00 PM    Final    CT Head Wo Contrast Result Date: 03/17/2023 CLINICAL DATA:  Altered mental status. EXAM: CT HEAD WITHOUT CONTRAST TECHNIQUE: Contiguous axial images were obtained from the base of the skull through the vertex without intravenous contrast. RADIATION DOSE REDUCTION: This exam was performed according to the departmental dose-optimization program which includes automated exposure control, adjustment of the mA and/or kV according to patient size and/or use of iterative reconstruction technique. COMPARISON:  Head CT 07/22/2021. FINDINGS: Brain: No acute intracranial hemorrhage. Gray-white differentiation is preserved. No hydrocephalus or extra-axial collection. No mass effect or midline shift. Vascular: No hyperdense vessel or unexpected calcification. Skull: No calvarial fracture or suspicious bone lesion. Skull base is unremarkable. Sinuses/Orbits: No acute finding. Other: None. IMPRESSION: No acute intracranial abnormality. Electronically Signed   By: Orvan Falconer M.D.   On: 03/17/2023 14:10   DG Chest Port 1 View Result Date: 03/17/2023 CLINICAL DATA:  Questionable sepsis.  Evaluate for abnormality. EXAM: PORTABLE CHEST 1 VIEW COMPARISON:   Chest radiographs 02/04/2023, 07/26/2021; CT chest 09/04/2009 FINDINGS: Cardiac silhouette is again mildly enlarged for AP technique. Mediastinal contours are unchanged and grossly within normal limits. High-grade atherosclerotic calcifications within the aortic arch. Mild bilateral diffuse interstitial thickening is similar to prior. Left apical pleural chronic ossification is unchanged. Prominence of the central pulmonary vasculature is similar to prior. Mildly decreased lung volumes with likely mild bibasilar subsegmental atelectasis, similar to prior. No definite pleural effusion. No pneumothorax. Diffuse decreased bone mineralization. Moderate left and mild right glenohumeral joint space narrowing. IMPRESSION: 1. Mildly decreased lung volumes with likely mild bibasilar subsegmental atelectasis, similar to prior. No definite focal pneumonia. 2. Mild cardiomegaly and mild pulmonary vascular congestion, similar to prior. Electronically Signed   By: Neita Garnet M.D.   On: 03/17/2023 13:56     Assessment and Plan:   New onset acute HFrEF 30-35% Echo this admission shows EF 30 to 35% with global hypokinesis.  Low normal RV function.  Severely elevated RVSP 68.3.  Severe MR, moderate TR, right atrial pressure 15.  No peripheral edema but obviously volume up.  The LVEF, RVSP, MR is significantly worse from prior reading a year ago.   Starting IV Lasix 40 mg twice daily.  Strict I's and O's and daily weights.. Discussed with husband about further consideration of ischemic evaluation, he would be inclined. However, she has advanced age and seems to be deconditioned and declining.  Recommend palliative care to continue to assess goals of care. Will titrate GDMT once more euvolemic.  PAF Decently rate controlled right now not on any rate control medications from prior visit in October.  Avoid Dilt with reduced EF.  Also reportedly had issues with beta-blocker due to fatigue.  Given she is reasonably rate  controlled we will hold off on any medications. Continue reduced dose of Eliquis 2.5 mg for age and CKD  Acute metabolic encephalopathy Cellulitis and compression fracture Likely in the setting of cellulitis, pain secondary to  compression fracture.  Management per primary team  CKD CRS, has been improving with diuresis.  Creatinine on admission 2.21.  Today 1.56.  Continue with diuresis  Goals of care Palliative care following  Risk Assessment/Risk Scores:    New York Heart Association (NYHA) Functional Class NYHA Class II  CHA2DS2-VASc Score = 6  This indicates a 9.7% annual risk of stroke. The patient's score is based upon: CHF History: 1 HTN History: 1 Diabetes History: 0 Stroke History: 0 Vascular Disease History: 1 Age Score: 2 Gender Score: 1   For questions or updates, please contact [facility] Please consult [email or web address] for contact info under    Signed, Abagail Kitchens, PA-C  03/21/2023 10:46 AM

## 2023-03-21 NOTE — Progress Notes (Signed)
Daily Progress Note   Patient Name: Krystal Henderson       Date: 03/21/2023 DOB: 1932/09/21  Age: 89 y.o. MRN#: [id number] Attending Physician: Hughie Closs, MD Primary Care Physician: Zola Button, Grayling Congress, DO Admit Date: 03/17/2023  Reason for Consultation/Follow-up: Pain control  Subjective: Patient is awake alert sitting up in bed.  Continues to complain of back pain.  She states that it is no better/no worse.  Husband at bedside feeding her lunch.  Length of Stay: 4  Current Medications: Scheduled Meds:  . acetaminophen  1,000 mg Oral TID  . apixaban  2.5 mg Oral BID  . vitamin C  1,000 mg Oral Daily  . calcitonin (salmon)  1 spray Alternating Nares Daily  . cyanocobalamin  1,000 mcg Intramuscular Daily  . famotidine  10 mg Oral Daily  . feeding supplement  1 Container Oral TID BM  . furosemide  40 mg Intravenous BID  . lidocaine  1 patch Transdermal Q24H  . megestrol  160 mg Oral Daily  . pregabalin  50 mg Oral QHS  . senna-docusate  1 tablet Oral BID    Continuous Infusions: . cefTRIAXone (ROCEPHIN)  IV 2 g (03/21/23 1217)    PRN Meds: albuterol, HYDROmorphone, polyethylene glycol  Physical Exam         Peers chronically ill No acute distress Regular work of breathing Awake alert  Vital Signs: BP (!) 152/98   Pulse 91   Temp 98.3 F (36.8 C) (Oral)   Resp 20   Ht 5\' 5"  (1.651 m)   Wt 61 kg   SpO2 98%   BMI 22.38 kg/m  SpO2: SpO2: 98 % O2 Device: O2 Device: Room Air O2 Flow Rate: O2 Flow Rate (L/min): 2 L/min  Intake/output summary:  Intake/Output Summary (Last 24 hours) at 03/21/2023 1326 Last data filed at 03/21/2023 1217 Gross per 24 hour  Intake 657 ml  Output 300 ml  Net 357 ml   LBM: Last BM Date : 03/16/23 Baseline Weight: Weight: 61  kg Most recent weight: Weight: 61 kg       Palliative Assessment/Data:      Patient Active Problem List   Diagnosis Date Noted  . Acute metabolic encephalopathy 03/17/2023  . Cellulitis of lower extremity 03/17/2023  . Atelectasis, bilateral 03/17/2023  . Delayed wound  healing 09/28/2022  . Heart failure with recovered ejection fraction (HFrecEF) (HCC) 07/24/2021  . Leg wound, right 07/23/2021  . Acute exacerbation of CHF (congestive heart failure) (HCC) 07/22/2021  . Generalized weakness   . CKD (chronic kidney disease) stage 4, GFR 15-29 ml/min (HCC)   . Headache   . Venous stasis dermatitis of right lower extremity 08/07/2020  . Insomnia 08/07/2020  . Acute on chronic combined systolic and diastolic CHF (congestive heart failure) (HCC) 11/12/2019  . Paroxysmal atrial fibrillation (HCC) 11/10/2019  . Impaired mobility and ADLs 11/10/2019  . Acute pulmonary embolism (HCC) 11/08/2019  . Impacted cerumen of left ear 01/03/2019  . Lesion of skin of scalp 12/30/2018  . Edema 06/22/2018  . Iron deficiency anemia 04/02/2018  . Lumbar post-laminectomy syndrome 07/15/2017  . Lumbar compression fracture (HCC) 11/26/2015  . Lower extremity edema 10/23/2015  . Chronic low back pain 06/21/2014  . Pain in joint, shoulder region 02/11/2014  . Vitamin D deficiency 03/30/2013  . Acute kidney injury (HCC) 01/01/2012  . Spinal stenosis 12/23/2011  . Osteopenia 12/23/2011  . Renal insufficiency, mild 08/24/2011  . CAP (community acquired pneumonia) 05/08/2011  . Vitamin B12 deficiency 09/24/2010  . IBS (irritable bowel syndrome) 09/24/2010  . Peripheral neuropathy 09/24/2010  . Normocytic anemia 09/24/2010  . GERD (gastroesophageal reflux disease) 08/12/2009  . DIVERTICULOSIS, COLON 09/08/2007  . Angiodysplasia of intestine with hemorrhage 09/08/2007  . DJD (degenerative joint disease) 09/08/2007  . History of colonic polyps 09/08/2007  . HYPERLIPIDEMIA 01/23/2007  . HTN  (hypertension) 01/23/2007  . Osteoporosis 01/23/2007  . HYPOGLYCEMIA, REACTIVE 06/30/2006    Palliative Care Assessment & Plan   Patient Profile:    Assessment: 89 year old lady with new onset acute heart failure with reduced ejection fraction 30-35%, paroxysmal atrial fibrillation, cellulitis, pain secondary to compression fracture, chronic kidney disease. Palliative consult for pain management   Recommendations/Plan: Medication history noted, patient is on Dilaudid tablet 1 mg by mouth every 4 hours on an as-needed basis.  Also has Lidoderm patch, has been placed on scheduled Tylenol.  Is on bowel and antiemetic regimen.  Continue current pain management.  Also has Lyrica at bedtime adjuvant.  Recommend outpatient pain management and/ or outpatient palliative services.    Code Status:    Code Status Orders  (From admission, onward)           Start     Ordered   03/17/23 1451  Do not attempt resuscitation (DNR)- Limited -Do Not Intubate (DNI)  Continuous       Question Answer Comment  If pulseless and not breathing No CPR or chest compressions.   In Pre-Arrest Conditions (Patient Is Breathing and Has A Pulse) Do not intubate. Provide all appropriate non-invasive medical interventions. Avoid ICU transfer unless indicated or required.   Consent: Discussion documented in EHR or advanced directives reviewed      03/17/23 1451           Code Status History     Date Active Date Inactive Code Status Order ID Comments User Context   07/22/2021 2229 08/01/2021 2033 DNR [id number]  Alba Cory, MD ED   11/08/2019 2312 11/12/2019 2205 Full Code [id number]  Eduard Clos, MD ED       Prognosis:  Unable to determine  Discharge Planning: To Be Determined  Care plan was discussed with patient and husband.  Thank you for allowing the Palliative Medicine Team to assist in the care of this patient. Low MDM  Greater than 50%  of this time was spent counseling  and coordinating care related to the above assessment and plan.  Rosalin Hawking, MD  Please contact Palliative Medicine Team phone at [phone] for questions and concerns.

## 2023-03-21 NOTE — TOC Progression Note (Addendum)
Transition of Care [facility]) - Progression Note    Patient Details  Name: Krystal Henderson MRN: [id number] Date of Birth: 1932-11-25  Transition [facility]) CM/SW Contact  Jeanise Durfey, Olegario Messier, RN Phone Number: 03/21/2023, 9:56 AM  Clinical Narrative: PT recc ST SNF;Alert x1;Left vm w/sposue await return call to discuss recc ST SNF if agree. Await call back.   -1:23p-spoke to spouse Peyton Najjar -agree to faxing out-has gone to [facility](preferred) does not want [address]. Await bed offers,choice prior auth.   Expected Discharge Plan: Skilled Nursing Facility Barriers to Discharge: Continued Medical Work up  Expected Discharge Plan and Services   Discharge Planning Services: CM Consult Post Acute Care Choice: Skilled Nursing Facility Living arrangements for the past 2 months: Skilled Nursing Facility                                       Social Determinants of Health (SDOH) Interventions SDOH Screenings   Food Insecurity: No Food Insecurity (03/17/2023)  Housing: Low Risk  (03/17/2023)  Transportation Needs: No Transportation Needs (03/17/2023)  Utilities: Not At Risk (03/17/2023)  Alcohol Screen: Low Risk  (02/22/2023)  Depression (PHQ2-9): Low Risk  (02/24/2023)  Financial Resource Strain: Low Risk  (02/22/2023)  Physical Activity: Unknown (02/22/2023)  Social Connections: Moderately Isolated (02/22/2023)  Stress: No Stress Concern Present (02/22/2023)  Tobacco Use: Low Risk  (03/17/2023)    Readmission Risk Interventions     No data to display

## 2023-03-21 NOTE — Consult Note (Signed)
Consultation Note Date: 03/21/2023   Patient Name: Krystal Henderson  DOB: 15-Jun-1932  MRN: [id number]  Age / Sex: 89 y.o., female  PCP: Zola Button, Grayling Congress, DO Referring Physician: Hughie Closs, MD  Reason for Consultation: Pain control  Subjective: I met today with Ms. Pickerill and her husband, Peyton Najjar.  At time of my encounter, Peyton Najjar was helping her eat lunch.  Reports that her appetite has been better today.  She continues to report having pain in her back.  Currently, she is receiving around-the-clock scheduled high-dose Tylenol and we tried calcitonin as well.  She does still have oxycodone but this caused moderate sedation/confusion the last time she took it so she has been avoiding.  We discussed continued difficulty with managing this pain and discussed in chronic and in acute fracture.  Discussed that both of these are contributing and therefore it is difficult to piece this out.  Discussed consideration for trial of a different opioid to see if it is better tolerated and she and her husband if needed, but are going to still try to avoid opioids if at all possible.   Code Status/Advance Care Planning: DNR   Symptom Management:  Back pain, chronic with acute exacerbation: Long standing back pain with notes in CareEverywhere back to at least 2019.  Acute worseing over the past few weeks resulting in hospitalization.  Imaging reveals  Acute compression fracture of the T12 vertebral body with approximately 50% height loss both anterior and posteriorly. Chronic, although significantly increased high-grade wedge deformity of L1 when compared to recent radiographs dated 02/04/2023. There is 0.9 cm of fragment retropulsion into the spinal canal (series 44, image 30).  Per husband, she was not a candidate for kyphoplasty, did not tolerate corset, and plan is for f/u with Dr. Lovell Sheehan as OP.    - Agree with  increasing tylenol to 1000mg  TID  - Continue lidocaine patch  -Continue calcitonin nasal spray  -We will rotate from low dose oxycodone prn to Dilaudid 1 mg p.o. as needed.  Reason for rotation is to see if she tolerates another opiod any better.  - F/u with neurosurgery Constipation: potential opioid related component.  Continue Senna S 1 tab BID.  Palliative Prophylaxis:  Bowel Regimen  Psycho-social/Spiritual:  Desire for further Chaplaincy support:no  Prognosis:  > 12 months  Discharge Planning: To Be Determined      Primary Diagnoses: Present on Admission:  Acute metabolic encephalopathy  CKD (chronic kidney disease) stage 4, GFR 15-29 ml/min (HCC)  GERD (gastroesophageal reflux disease)  HTN (hypertension)  HYPERLIPIDEMIA  Lumbar compression fracture (HCC)  Vitamin B12 deficiency  Paroxysmal atrial fibrillation (HCC)  Heart failure with recovered ejection fraction (HFrecEF) (HCC)  Cellulitis of lower extremity  Atelectasis, bilateral   I have reviewed the medical record, interviewed the patient and family, and examined the patient. The following aspects are pertinent.  Past Medical History:  Diagnosis Date   Anemia    hx of    Angiodysplasia 2007   @ colonoscopy  Anxiety    PMH of   Chronic low back pain 06/21/2014   Diverticulosis of colon (without mention of hemorrhage)    DJD (degenerative joint disease)    Esophageal reflux    inactive   History of vertebral fracture 11/2015   HTN (hypertension)    Hyperlipemia 2006   LDL 130   Hypoglycemia    reactive   Pelvic fracture (HCC) 12/30/2011   [facility]   Peripheral neuropathy    Personal history of colonic polyps    adenomatous   Vitamin B12 deficiency    Social History   Socioeconomic History   Marital status: Married    Spouse name: Peyton Najjar   Number of children: 1   Years of education: hs   Highest education level: 12th grade  Occupational History   Occupation: retired    Associate Professor:  RETIRED  Tobacco Use   Smoking status: Never   Smokeless tobacco: Never  Substance and Sexual Activity   Alcohol use: Yes    Alcohol/week: 3.0 standard drinks of alcohol    Types: 3 Glasses of wine per week    Comment: wine occasionally   Drug use: No   Sexual activity: Yes    Birth control/protection: Diaphragm  Other Topics Concern   Not on file  Social History Narrative   Patient is right handed.   Patient does not drink caffeine.   Lives with husband   Lucila [place] lives nearby   Social Drivers of Health   Financial Resource Strain: Low Risk  (02/22/2023)   Overall Financial Resource Strain (CARDIA)    Difficulty of Paying Living Expenses: Not hard at all  Food Insecurity: No Food Insecurity (03/17/2023)   Hunger Vital Sign    Worried About Running Out of Food in the Last Year: Never true    Ran Out of Food in the Last Year: Never true  Transportation Needs: No Transportation Needs (03/17/2023)   PRAPARE - Administrator, Civil Service (Medical): No    Lack of Transportation (Non-Medical): No  Physical Activity: Unknown (02/22/2023)   Exercise Vital Sign    Days of Exercise per Week: 0 days    Minutes of Exercise per Session: Not on file  Stress: No Stress Concern Present (02/22/2023)   [employer] of Occupational Health - Occupational Stress Questionnaire    Feeling of Stress : Only a little  Social Connections: Moderately Isolated (02/22/2023)   Social Connection and Isolation Panel [NHANES]    Frequency of Communication with Friends and Family: Twice a week    Frequency of Social Gatherings with Friends and Family: Once a week    Attends Religious Services: Never    Database administrator or Organizations: No    Attends Engineer, structural: Not on file    Marital Status: Married   Family History  Problem Relation Age of Onset   Heart attack Father 2   Kidney disease Mother        renal failure   Throat cancer Sister        smoker    Osteoporosis Sister    Heart attack Brother 14   Stroke Brother 47       smoker   Depression Maternal Uncle    Cancer Brother        X3  lung cancer, all smokers   Peripheral vascular disease Daughter    Colon cancer Neg Hx    Diabetes Neg Hx    Scheduled Meds:  acetaminophen  1,000 mg Oral TID   apixaban  2.5 mg Oral BID   vitamin C  1,000 mg Oral Daily   calcitonin (salmon)  1 spray Alternating Nares Daily   cyanocobalamin  1,000 mcg Intramuscular Daily   famotidine  10 mg Oral Daily   feeding supplement  1 Container Oral TID BM   furosemide  40 mg Intravenous BID   lidocaine  1 patch Transdermal Q24H   megestrol  160 mg Oral Daily   pregabalin  50 mg Oral QHS   senna-docusate  1 tablet Oral BID   Continuous Infusions:  cefTRIAXone (ROCEPHIN)  IV 2 g (03/20/23 1202)   PRN Meds:.albuterol, HYDROmorphone, polyethylene glycol Medications Prior to Admission:  Prior to Admission medications   Medication Sig Start Date End Date Taking? Authorizing Provider  acetaminophen (TYLENOL) 325 MG tablet Take 2 tablets (650 mg total) by mouth every 6 (six) hours as needed for mild pain (or Fever >/= 101). 08/01/21  Yes Albertine Grates, MD  apixaban (ELIQUIS) 2.5 MG TABS tablet TAKE 1 TABLET(2.5 MG) BY MOUTH TWICE DAILY 07/19/22  Yes Camnitz, Andree Coss, MD  Ascorbic Acid (VITAMIN C) 1000 MG tablet Take 1,000 mg by mouth daily.   Yes [provider]  Calcium Carbonate (CALCIUM 600 PO) Take 600 mg by mouth daily.   Yes [provider]  Cholecalciferol (VITAMIN D) 2000 UNITS CAPS Take 2,000 Units by mouth daily.   Yes [provider]  ferrous gluconate (FERGON) 324 MG tablet Take 1 tablet (324 mg total) by mouth daily. 02/24/23  Yes Seabron Spates R, DO  furosemide (LASIX) 40 MG tablet Take 1 tablet (40 mg total) by mouth daily. 08/19/22  Yes Donato Schultz, DO  hydrOXYzine (VISTARIL) 25 MG capsule Take 1 capsule (25 mg total) by mouth every 8 (eight) hours as  needed for anxiety. 08/19/22  Yes Seabron Spates R, DO  mupirocin ointment (BACTROBAN) 2 % Apply 1 Application topically 2 (two) times daily. 09/22/22  Yes Saguier, Ramon Dredge, PA-C  Polyethyl Glycol-Propyl Glycol (SYSTANE ULTRA OP) Place 1 drop into both eyes daily as needed (for dry eyes).   Yes [provider]  polyethylene glycol (MIRALAX / GLYCOLAX) 17 g packet Take 17 g by mouth 2 (two) times daily. Patient taking differently: Take 17 g by mouth daily as needed for moderate constipation. 08/01/21  Yes Albertine Grates, MD  pregabalin (LYRICA) 50 MG capsule TAKE 1 CAPSULE(50 MG) BY MOUTH AT BEDTIME 02/17/23  Yes Seabron Spates R, DO  vitamin B-12 (CYANOCOBALAMIN) 1000 MCG tablet Take 1 tablet (1,000 mcg total) by mouth daily. 08/01/21  Yes Albertine Grates, MD  oxyCODONE-acetaminophen (PERCOCET/ROXICET) 5-325 MG tablet Take 1 tablet by mouth every 6 (six) hours as needed for severe pain (pain score 7-10). Patient not taking: Reported on 03/17/2023 03/11/23   Terrilee Files, MD   Allergies  Allergen Reactions   Codeine Nausea Only   Duloxetine Other (See Comments)    REACTION: intolerance--She does not remember taking this Rx- states she did not feel well    Sertraline Hcl Other (See Comments)    REACTION: intolerance-- Did not help with Depression   Review of Systems Pain in back, poor appetite, constipation, pain in leg  Physical Exam General: Alert, awake, in no acute distress.   Heart: Regular rate and rhythm. No murmur appreciated. Lungs: Good air movement, clear Abdomen: Soft, nontender, nondistended, positive bowel sounds.   Ext: Trace edema, bilateral LE erythema Skin: Warm and dry  Neuro: Grossly intact, nonfocal.     Vital Signs: BP 131/75 (BP Location: Left Arm)   Pulse 86   Temp (!) 97.5 F (36.4 C) (Oral)   Resp 16   Ht 5\' 5"  (1.651 m)   Wt 61 kg   SpO2 93%   BMI 22.38 kg/m  Pain Scale: 0-10   Pain Score: 0-No pain   SpO2: SpO2: 93 % O2 Device:SpO2: 93  % O2 Flow Rate: .O2 Flow Rate (L/min): 2 L/min  IO: Intake/output summary:  Intake/Output Summary (Last 24 hours) at 03/21/2023 1115 Last data filed at 03/20/2023 2226 Gross per 24 hour  Intake 607 ml  Output 300 ml  Net 307 ml    LBM: Last BM Date : 03/16/23 Baseline Weight: Weight: 61 kg Most recent weight: Weight: 61 kg     Palliative Assessment/Data:     Moderate MDM with management of opioid pain medication.  Signed by: Romie Minus, MD   Please contact Palliative Medicine Team phone at [phone] for questions and concerns.  For individual provider: See Loretha Stapler

## 2023-03-21 NOTE — Progress Notes (Signed)
PROGRESS NOTE    Krystal Henderson  UUV:[id number] DOB: 1932/08/02 DOA: 03/17/2023 PCP: Donato Schultz, DO   Brief Narrative:  89 year old with past medical history significant for anemia, angiodysplasia of the colon, anxiety, chronic lower back pain, diverticulosis, colon polyps, degenerative joint disease, GERD vertebral fracture, hypertension, reactive hypoglycemia, pelvic fracture, peripheral neuropathy, vitamin B-12 deficiency who was brought to the ED with altered mental status that is started the morning of admission.  She was alert and oriented only to person.   Patient admitted with acute metabolic encephalopathy, cellulitis of the right lower extremity. New reduce systolic HF.   Assessment & Plan:   Principal Problem:   Acute metabolic encephalopathy Active Problems:   HYPERLIPIDEMIA   HTN (hypertension)   GERD (gastroesophageal reflux disease)   Vitamin B12 deficiency   Peripheral neuropathy   Lumbar compression fracture (HCC)   Paroxysmal atrial fibrillation (HCC)   CKD (chronic kidney disease) stage 4, GFR 15-29 ml/min (HCC)   Heart failure with recovered ejection fraction (HFrecEF) (HCC)   Cellulitis of lower extremity   Atelectasis, bilateral  1-Acute metabolic encephalopathy: In the setting of lower extremity cellulitis, delirium, pain/B12 deficiency. -CT head no acute intracranial abnormality B 12 was low last week, she is on supplement. -TSH: 4.3, ,Cortisol level, ammonia: 11 , RPR: non reactive.  She is fully alert and oriented during my evaluation today.   Acute Systolic Heart failure:  ECHO 47/42: EF 35 % global hypokinesis.  Cardiology consulted.  They have started her on IV Lasix, they offered cardiac cath but due to her significant deconditioning, have recommended palliative have firm goal of care with her before doing any intervention, in the meantime they are focusing on titrating GDMT once more euvolemic.   Lower extremity cellulitis Almost  negligible erythema right lower extremity with mild warmth.  Continue Rocephin for now.  Monitor daily.   Lumbar compression fracture -CT Lumbar 12/06: Acute compression fracture of the T12 vertebral body with approximately 50% height loss both anterior and posteriorly. Chronic, although significantly increased high-grade wedge deformity of L1 when compared to recent radiographs dated 02/04/2023. -Started tylenol schedule, lidocaine patch/ -reduce oxy to 2.5mg  to avoid oversedation.  -Palliative care consultation.  Increase tylenol to 1 gr TID on 12/14 On calcitonin spray.    Hypertension: Controlled, not on any medications other than IV Lasix.   GERD: -Pepcid.  Peripheral neuropathy:  Continue with pregabalin   CKD stage IV: Creatinine baseline: 1.6--1.9.  Currently better than her baseline.   Paroxysmal A-fib: -Continue with Eliquis.    Hyperlipidemia: Not on meds.  Follow up outpatient.   DVT prophylaxis: apixaban (ELIQUIS) tablet 2.5 mg Start: 03/17/23 2200   Code Status: Limited: Do not attempt resuscitation (DNR) -DNR-LIMITED -Do Not Intubate/DNI   Family Communication:  None present at bedside.  Plan of care discussed with patient in length and he/she verbalized understanding and agreed with it.  Status is: Inpatient Remains inpatient appropriate because: Needs diuresis.   Estimated body mass index is 22.38 kg/m as calculated from the following:   Height as of this encounter: 5\' 5"  (1.651 m).   Weight as of this encounter: 61 kg.  Pressure Injury 03/17/23 Buttocks Right Stage 2 -  Partial thickness loss of dermis presenting as a shallow open injury with a red, pink wound bed without slough. (Active)  03/17/23 2000  Location: Buttocks  Location Orientation: Right  Staging: Stage 2 -  Partial thickness loss of dermis presenting as a shallow open injury  with a red, pink wound bed without slough.  Wound Description (Comments):   Present on Admission: Yes  Dressing  Type Foam - Lift dressing to assess site every shift 03/21/23 0945     Pressure Injury 03/17/23 Sacrum Mid Stage 2 -  Partial thickness loss of dermis presenting as a shallow open injury with a red, pink wound bed without slough. (Active)  03/17/23 2000  Location: Sacrum  Location Orientation: Mid  Staging: Stage 2 -  Partial thickness loss of dermis presenting as a shallow open injury with a red, pink wound bed without slough.  Wound Description (Comments):   Present on Admission: Yes  Dressing Type Foam - Lift dressing to assess site every shift 03/21/23 0945     Pressure Injury 03/17/23 Vertebral column Lower;Mid Stage 1 -  Intact skin with non-blanchable redness of a localized area usually over a bony prominence. red ublanchable wound to spine X2 (2x1 and 1x1) (Active)  03/17/23 2000  Location: Vertebral column  Location Orientation: Lower;Mid  Staging: Stage 1 -  Intact skin with non-blanchable redness of a localized area usually over a bony prominence.  Wound Description (Comments): red ublanchable wound to spine X2 (2x1 and 1x1)  Present on Admission: Yes  Dressing Type Foam - Lift dressing to assess site every shift 03/21/23 0945   Nutritional Assessment: Body mass index is 22.38 kg/m.[name] Seen by dietician.  I agree with the assessment and plan as outlined below: Nutrition Status:        . Skin Assessment: I have examined the patient's skin and I agree with the wound assessment as performed by the wound care RN as outlined below: Pressure Injury 03/17/23 Buttocks Right Stage 2 -  Partial thickness loss of dermis presenting as a shallow open injury with a red, pink wound bed without slough. (Active)  03/17/23 2000  Location: Buttocks  Location Orientation: Right  Staging: Stage 2 -  Partial thickness loss of dermis presenting as a shallow open injury with a red, pink wound bed without slough.  Wound Description (Comments):   Present on Admission: Yes  Dressing Type Foam -  Lift dressing to assess site every shift 03/21/23 0945     Pressure Injury 03/17/23 Sacrum Mid Stage 2 -  Partial thickness loss of dermis presenting as a shallow open injury with a red, pink wound bed without slough. (Active)  03/17/23 2000  Location: Sacrum  Location Orientation: Mid  Staging: Stage 2 -  Partial thickness loss of dermis presenting as a shallow open injury with a red, pink wound bed without slough.  Wound Description (Comments):   Present on Admission: Yes  Dressing Type Foam - Lift dressing to assess site every shift 03/21/23 0945     Pressure Injury 03/17/23 Vertebral column Lower;Mid Stage 1 -  Intact skin with non-blanchable redness of a localized area usually over a bony prominence. red ublanchable wound to spine X2 (2x1 and 1x1) (Active)  03/17/23 2000  Location: Vertebral column  Location Orientation: Lower;Mid  Staging: Stage 1 -  Intact skin with non-blanchable redness of a localized area usually over a bony prominence.  Wound Description (Comments): red ublanchable wound to spine X2 (2x1 and 1x1)  Present on Admission: Yes  Dressing Type Foam - Lift dressing to assess site every shift 03/21/23 0945    Consultants:  Cardiology  Procedures:  As above  Antimicrobials:  Anti-infectives (From admission, onward)    Start     Dose/Rate Route Frequency Ordered Stop  03/17/23 1230  cefTRIAXone (ROCEPHIN) 2 g in sodium chloride 0.9 % 100 mL IVPB        2 g 200 mL/hr over 30 Minutes Intravenous Every 24 hours 03/17/23 1223 03/24/23 1229         Subjective: Patient seen and examined.  She has no complaints, she says that she wants to go home.  She is fully alert and oriented.  Appears deconditioned.  Objective: Vitals:   03/20/23 0453 03/20/23 1428 03/20/23 2017 03/21/23 0541  BP: (!) 142/87 129/80 (!) 140/84 131/75  Pulse: (!) 103 92 86 86  Resp:  18 19 16   Temp:  (!) 97.5 F (36.4 C) 97.8 F (36.6 C) (!) 97.5 F (36.4 C)  TempSrc:  Oral Oral Oral   SpO2: 100% 98% 98% 93%  Weight: 61.9 kg   61 kg  Height:        Intake/Output Summary (Last 24 hours) at 03/21/2023 1211 Last data filed at 03/20/2023 2226 Gross per 24 hour  Intake 607 ml  Output 300 ml  Net 307 ml   Filed Weights   03/19/23 0500 03/20/23 0453 03/21/23 0541  Weight: 59 kg 61.9 kg 61 kg    Examination:  General exam: Appears calm and comfortable  Respiratory system: Clear to auscultation. Respiratory effort normal. Cardiovascular system: S1 & S2 heard, RRR. No JVD, murmurs, rubs, gallops or clicks. No pedal edema. Gastrointestinal system: Abdomen is nondistended, soft and nontender. No organomegaly or masses felt. Normal bowel sounds heard. Central nervous system: Alert and oriented. No focal neurological deficits. Extremities: Symmetric 5 x 5 power. Skin: Very mild erythema right lower extremity with mild warmth. Psychiatry: Judgement and insight appear normal. Mood & affect appropriate.    Data Reviewed: I have personally reviewed following labs and imaging studies  CBC: Recent Labs  Lab 03/17/23 1220 03/18/23 0507 03/21/23 1132  WBC 6.6 6.1 7.4  NEUTROABS 4.3  --   --   HGB 13.2 11.8* 12.1  HCT 40.5 37.3 37.1  MCV 94.4 95.9 94.2  PLT 324 285 327   Basic Metabolic Panel: Recent Labs  Lab 03/17/23 1220 03/18/23 0507 03/19/23 0518 03/20/23 0519 03/21/23 1132  NA 137 137 134* 135 136  K 4.0 4.4 3.9 4.0 4.1  CL 104 105 99 102 103  CO2 23 22 25 25 26   GLUCOSE 91 80 97 100* 114*  BUN 35* 33* 30* 26* 27*  CREATININE 2.21* 2.04* 1.78* 1.56* 1.40*  CALCIUM 9.2 8.8* 8.7* 8.7* 8.9   GFR: Estimated Creatinine Clearance: 25 mL/min (A) (by C-G formula based on SCr of 1.4 mg/dL (H)). Liver Function Tests: Recent Labs  Lab 03/17/23 1220 03/18/23 0507  AST 16 14*  ALT 20 17  ALKPHOS 81 69  BILITOT 1.0 0.7  PROT 7.5 6.3*  ALBUMIN 3.7 3.0*   No results for input(s): "LIPASE", "AMYLASE" in the last 168 hours. Recent Labs  Lab  03/18/23 1050  AMMONIA 11   Coagulation Profile: Recent Labs  Lab 03/17/23 1220  INR 2.0*   Cardiac Enzymes: No results for input(s): "CKTOTAL", "CKMB", "CKMBINDEX", "TROPONINI" in the last 168 hours. BNP (last 3 results) No results for input(s): "PROBNP" in the last 8760 hours. HbA1C: No results for input(s): "HGBA1C" in the last 72 hours. CBG: Recent Labs  Lab 03/20/23 1954 03/20/23 2354 03/21/23 0348 03/21/23 0737 03/21/23 1124  GLUCAP 129* 120* 108* 100* 122*   Lipid Profile: No results for input(s): "CHOL", "HDL", "LDLCALC", "TRIG", "CHOLHDL", "LDLDIRECT" in the last  72 hours. Thyroid Function Tests: No results for input(s): "TSH", "T4TOTAL", "FREET4", "T3FREE", "THYROIDAB" in the last 72 hours. Anemia Panel: No results for input(s): "VITAMINB12", "FOLATE", "FERRITIN", "TIBC", "IRON", "RETICCTPCT" in the last 72 hours. Sepsis Labs: Recent Labs  Lab 03/17/23 1437  LATICACIDVEN 1.1    Recent Results (from the past 240 hours)  Blood Culture (routine x 2)     Status: None (Preliminary result)   Collection Time: 03/17/23 12:15 PM   Specimen: BLOOD  Result Value Ref Range Status   Specimen Description   Final    BLOOD RIGHT ANTECUBITAL Performed at [facility], [address], [place], [place] [address]    Special Requests   Final    BOTTLES DRAWN AEROBIC AND ANAEROBIC Blood Culture results may not be optimal due to an inadequate volume of blood received in culture bottles Performed at [facility], [address] [place], [place] [address]    Culture   Final    NO GROWTH 4 DAYS Performed at [facility], [address], [place], [place] [address]    Report Status PENDING  Incomplete  Blood Culture (routine x 2)     Status: None (Preliminary result)   Collection Time: 03/17/23 12:20 PM   Specimen: BLOOD  Result Value Ref Range Status   Specimen Description   Final    BLOOD BLOOD LEFT ARM Performed at [facility], [address], [place], [place] [address]    Special Requests   Final    BOTTLES DRAWN AEROBIC AND ANAEROBIC Blood Culture results may not be optimal due to an inadequate volume of blood received in culture bottles Performed at [facility], [address], [place], [place] [address]    Culture   Final    NO GROWTH 4 DAYS Performed at [facility], [address] [place], [place] [address]    Report Status PENDING  Incomplete  Resp panel by RT-PCR (RSV, Flu A&B, Covid) Anterior Nasal Swab     Status: None   Collection Time: 03/17/23 12:22 PM   Specimen: Anterior Nasal Swab  Result Value Ref Range Status   SARS Coronavirus 2 by RT PCR NEGATIVE NEGATIVE Final    Comment: (NOTE) SARS-CoV-2 target nucleic acids are NOT DETECTED.  The SARS-CoV-2 RNA is generally detectable in upper respiratory specimens during the acute phase of infection. The lowest concentration of SARS-CoV-2 viral copies this assay can detect is 138 copies/mL. A negative result does not preclude SARS-Cov-2 infection and should not be used as the sole basis for treatment or other patient management decisions. A negative result may occur with  improper specimen collection/handling, submission of specimen other than nasopharyngeal swab, presence of viral mutation(s) within the areas targeted by this assay, and inadequate number of viral copies(<138 copies/mL). A negative result must be combined with clinical observations, patient history, and epidemiological information. The expected result is Negative.  Fact Sheet for Patients:  BloggerCourse.com  Fact Sheet for Healthcare Providers:  SeriousBroker.it  This test is no t yet approved or cleared by the [place] FDA and  has been authorized for detection and/or diagnosis of SARS-CoV-2 by FDA under an Emergency Use Authorization (EUA). This EUA will remain  in  effect (meaning this test can be used) for the duration of the COVID-19 declaration under Section 564(b)(1) of the Act, 21 U.S.C.section 360bbb-3(b)(1), unless the authorization is terminated  or revoked sooner.  Influenza A by PCR NEGATIVE NEGATIVE Final   Influenza B by PCR NEGATIVE NEGATIVE Final    Comment: (NOTE) The Xpert Xpress SARS-CoV-2/FLU/RSV plus assay is intended as an aid in the diagnosis of influenza from Nasopharyngeal swab specimens and should not be used as a sole basis for treatment. Nasal washings and aspirates are unacceptable for Xpert Xpress SARS-CoV-2/FLU/RSV testing.  Fact Sheet for Patients: BloggerCourse.com  Fact Sheet for Healthcare Providers: SeriousBroker.it  This test is not yet approved or cleared by the [place] FDA and has been authorized for detection and/or diagnosis of SARS-CoV-2 by FDA under an Emergency Use Authorization (EUA). This EUA will remain in effect (meaning this test can be used) for the duration of the COVID-19 declaration under Section 564(b)(1) of the Act, 21 U.S.C. section 360bbb-3(b)(1), unless the authorization is terminated or revoked.     Resp Syncytial Virus by PCR NEGATIVE NEGATIVE Final    Comment: (NOTE) Fact Sheet for Patients: BloggerCourse.com  Fact Sheet for Healthcare Providers: SeriousBroker.it  This test is not yet approved or cleared by the [place] FDA and has been authorized for detection and/or diagnosis of SARS-CoV-2 by FDA under an Emergency Use Authorization (EUA). This EUA will remain in effect (meaning this test can be used) for the duration of the COVID-19 declaration under Section 564(b)(1) of the Act, 21 U.S.C. section 360bbb-3(b)(1), unless the authorization is terminated or revoked.  Performed at [facility], [address] [place], [place] [address]       Radiology Studies: No results found.  Scheduled Meds:  acetaminophen  1,000 mg Oral TID   apixaban  2.5 mg Oral BID   vitamin C  1,000 mg Oral Daily   calcitonin (salmon)  1 spray Alternating Nares Daily   cyanocobalamin  1,000 mcg Intramuscular Daily   famotidine  10 mg Oral Daily   feeding supplement  1 Container Oral TID BM   furosemide  40 mg Intravenous BID   lidocaine  1 patch Transdermal Q24H   megestrol  160 mg Oral Daily   pregabalin  50 mg Oral QHS   senna-docusate  1 tablet Oral BID   Continuous Infusions:  cefTRIAXone (ROCEPHIN)  IV 2 g (03/20/23 1202)     LOS: 4 days   Hughie Closs, MD [facility]  03/21/2023, 12:11 PM   *Please note that this is a verbal dictation therefore any spelling or grammatical errors are due to the "Dragon Medical One" system interpretation.  Please page via Amion and do not message via secure chat for urgent patient care matters. Secure chat can be used for non urgent patient care matters.  How to contact the [facility] Attending or Consulting provider 7A - 7P or covering provider during after hours 7P -7A, for this patient?  Check the care team in [facility] and look for a) attending/consulting [facility] provider listed and b) the [facility] team listed. Page or secure chat 7A-7P. Log into [email or web address] and use [facility]'s universal password to access. If you do not have the password, please contact the hospital operator. Locate the [facility] provider you are looking for under [facility] and page to a number that you can be directly reached. If you still have difficulty reaching the provider, please page the [facility] (Director on Call) for the Hospitalists listed on amion for assistance.

## 2023-03-21 NOTE — Progress Notes (Signed)
Physical Therapy Treatment Patient Details Name: Krystal Henderson MRN: [id number] DOB: [birth year] Today's Date: 03/21/2023   History of Present Illness Pt is 89 yo female presented on 03/17/23 with AMS.  Pt with acute metabolic encephalopathy in setting of cellulitis of LE.   Pt recently in ED on 12/6 with compression fx T12 and chronic L1.  At that time neurosurgery (Dr Lovell Sheehan) recommended lumbar corset per ED note.  Spouse reports brace did not help or fit properly (pt kyphotic) and pt not wearing at home.  Pt with hx including but not limited to anemia, angiodysplasia of the colon, anxiety, chronic lower back pain, diverticulosis, colon polyps, degenerative joint disease, GERD, history of vertebral fracture, hypertension, hyperlipidemia, reactive hypoglycemia, pelvic fracture, peripheral neuropathy, vitamin B12 deficiency    PT Comments  Reviewed log roll technique with pt/spouse. Pt required mod assist for rolling and for sidelying to sit. Pt sat edge of bed ~5 minutes, tolerance limited by back pain. Pt positioned on L side in bed. Encouraged pt/spouse to have pt change positions in bed several times/day to minimize risk of pressure sores. Patient will benefit from continued inpatient follow up therapy, <3 hours/day.      If plan is discharge home, recommend the following: A lot of help with walking and/or transfers;A lot of help with bathing/dressing/bathroom;Assistance with cooking/housework;Help with stairs or ramp for entrance   Can travel by private vehicle     No  Equipment Recommendations  Wheelchair cushion (measurements PT);Wheelchair (measurements PT)    Recommendations for Other Services       Precautions / Restrictions Precautions Precautions: Fall;Back Precaution Comments: Performed tx with back precautions; reviewed log roll technique with pt/spouse Restrictions Weight Bearing Restrictions Per Provider Order: No     Mobility  Bed Mobility   Bed Mobility: Rolling,  Sidelying to Sit, Sit to Sidelying Rolling: Mod assist Sidelying to sit: Mod assist   Sit to supine: Mod assist   General bed mobility comments: VCs for log roll technique, assist to initiate roll; pt sat edge of bed for ~5 minutes, sitting tolerance limited by back pain    Transfers                        Ambulation/Gait                   Stairs             Wheelchair Mobility     Tilt Bed    Modified Rankin (Stroke Patients Only)       Balance Overall balance assessment: Needs assistance Sitting-balance support: No upper extremity supported, Feet supported Sitting balance-Leahy Scale: Fair                                      Cognition Arousal: Alert Behavior During Therapy: Flat affect Overall Cognitive Status: Impaired/Different from baseline Area of Impairment: Memory                     Memory: Decreased short-term memory                  Exercises      General Comments        Pertinent Vitals/Pain Pain Assessment Faces Pain Scale: Hurts whole lot Pain Location: back with bed mobility Pain Descriptors / Indicators: Grimacing, Discomfort Pain Intervention(s): Limited activity within patient's tolerance,  Monitored during session, Premedicated before session    Home Living                          Prior Function            PT Goals (current goals can now be found in the care plan section) Acute Rehab PT Goals Patient Stated Goal: spouse reports open to SNF if needed PT Goal Formulation: With patient/family Time For Goal Achievement: 04/01/23 Potential to Achieve Goals: Good Progress towards PT goals: Progressing toward goals    Frequency    Min 1X/week      PT Plan      Co-evaluation              AM-PAC PT "6 Clicks" Mobility   Outcome Measure  Help needed turning from your back to your side while in a flat bed without using bedrails?: A Lot Help needed  moving from lying on your back to sitting on the side of a flat bed without using bedrails?: A Lot Help needed moving to and from a bed to a chair (including a wheelchair)?: Total Help needed standing up from a chair using your arms (e.g., wheelchair or bedside chair)?: Total Help needed to walk in hospital room?: Total Help needed climbing 3-5 steps with a railing? : Total 6 Click Score: 8    End of Session   Activity Tolerance: Patient limited by pain Patient left: in bed;with call bell/phone within reach;with bed alarm set;with family/visitor present Nurse Communication: Mobility status PT Visit Diagnosis: Other abnormalities of gait and mobility (R26.89);Muscle weakness (generalized) (M62.81);Pain     Time: 1357-1416 PT Time Calculation (min) (ACUTE ONLY): 19 min  Charges:    $Therapeutic Activity: 8-22 mins PT General Charges $$ ACUTE PT VISIT: 1 Visit                     Tamala Ser PT 03/21/2023  Acute Rehabilitation Services  Office [phone]

## 2023-03-21 NOTE — [place] FL2 (Signed)
[place] MEDICAID FL2 LEVEL OF CARE FORM     IDENTIFICATION  Patient Name: Krystal Henderson Birthdate: 29-Dec-1932 Sex: female Admission Date (Current Location): 03/17/2023  [facility] and [place] Number:  Producer, television/film/video and Address:  [facility],  [address] [place], [place] [address]      Provider Number: [id number]  Attending Physician Name and Address:  Hughie Closs, MD  Relative Name and Phone Number:  Peyton Najjar Boughner(spouse)[phone]    Current Level of Care: Hospital Recommended Level of Care: Skilled Nursing Facility Prior Approval Number:    Date Approved/Denied:   PASRR Number: [id number]  Discharge Plan: SNF    Current Diagnoses: Patient Active Problem List   Diagnosis Date Noted   Acute metabolic encephalopathy 03/17/2023   Cellulitis of lower extremity 03/17/2023   Atelectasis, bilateral 03/17/2023   Delayed wound healing 09/28/2022   Heart failure with recovered ejection fraction (HFrecEF) (HCC) 07/24/2021   Leg wound, right 07/23/2021   Acute exacerbation of CHF (congestive heart failure) (HCC) 07/22/2021   Generalized weakness    CKD (chronic kidney disease) stage 4, GFR 15-29 ml/min (HCC)    Headache    Venous stasis dermatitis of right lower extremity 08/07/2020   Insomnia 08/07/2020   Acute on chronic combined systolic and diastolic CHF (congestive heart failure) (HCC) 11/12/2019   Paroxysmal atrial fibrillation (HCC) 11/10/2019   Impaired mobility and ADLs 11/10/2019   Acute pulmonary embolism (HCC) 11/08/2019   Impacted cerumen of left ear 01/03/2019   Lesion of skin of scalp 12/30/2018   Edema 06/22/2018   Iron deficiency anemia 04/02/2018   Lumbar post-laminectomy syndrome 07/15/2017   Lumbar compression fracture (HCC) 11/26/2015   Lower extremity edema 10/23/2015   Chronic low back pain 06/21/2014   Pain in joint, shoulder region 02/11/2014   Vitamin D deficiency 03/30/2013   Acute kidney injury (HCC) 01/01/2012    Spinal stenosis 12/23/2011   Osteopenia 12/23/2011   Renal insufficiency, mild 08/24/2011   CAP (community acquired pneumonia) 05/08/2011   Vitamin B12 deficiency 09/24/2010   IBS (irritable bowel syndrome) 09/24/2010   Peripheral neuropathy 09/24/2010   Normocytic anemia 09/24/2010   GERD (gastroesophageal reflux disease) 08/12/2009   DIVERTICULOSIS, COLON 09/08/2007   Angiodysplasia of intestine with hemorrhage 09/08/2007   DJD (degenerative joint disease) 09/08/2007   History of colonic polyps 09/08/2007   HYPERLIPIDEMIA 01/23/2007   HTN (hypertension) 01/23/2007   Osteoporosis 01/23/2007   HYPOGLYCEMIA, REACTIVE 06/30/2006    Orientation RESPIRATION BLADDER Height & Weight     Self  Normal   Weight: 61 kg Height:  5\' 5"  (165.1 cm)  BEHAVIORAL SYMPTOMS/MOOD NEUROLOGICAL BOWEL NUTRITION STATUS      Continent Diet (Heart Healthy)  AMBULATORY STATUS COMMUNICATION OF NEEDS Skin   Limited Assist Verbally Other (Comment), PU Stage and Appropriate Care   PU Stage 2 Dressing: Daily (R buttock wound foam dsg)                   Personal Care Assistance Level of Assistance  Bathing, Feeding, Dressing Bathing Assistance: Limited assistance   Dressing Assistance: Limited assistance     Functional Limitations Info  Sight, Hearing, Speech Sight Info: Impaired (eyeglasses) Hearing Info: Adequate Speech Info: Impaired (Dentures-top/bottom)    SPECIAL CARE FACTORS FREQUENCY  PT (By licensed PT), OT (By licensed OT)     PT Frequency: 5x week OT Frequency: 5x week            Contractures Contractures Info: Not present  Additional Factors Info  Code Status, Allergies Code Status Info: DNR Allergies Info: Codeine, Duloxetine, Sertraline Hcl           Current Medications (03/21/2023):  This is the current hospital active medication list Current Facility-Administered Medications  Medication Dose Route Frequency Provider Last Rate Last Admin   acetaminophen  (TYLENOL) tablet 1,000 mg  1,000 mg Oral TID Regalado, Belkys A, MD   1,000 mg at 03/21/23 1023   albuterol (PROVENTIL) (2.5 MG/3ML) 0.083% nebulizer solution 2.5 mg  2.5 mg Nebulization Q4H PRN Bobette Mo, MD       apixaban Everlene Balls) tablet 2.5 mg  2.5 mg Oral BID Bobette Mo, MD   2.5 mg at 03/21/23 1023   ascorbic acid (VITAMIN C) tablet 1,000 mg  1,000 mg Oral Daily Bobette Mo, MD   1,000 mg at 03/21/23 1023   calcitonin (salmon) (MIACALCIN/FORTICAL) nasal spray 1 spray  1 spray Alternating Nares Daily Romie Minus, MD   1 spray at 03/21/23 1209   cefTRIAXone (ROCEPHIN) 2 g in sodium chloride 0.9 % 100 mL IVPB  2 g Intravenous Q24H Schuman, James T, PA-C 200 mL/hr at 03/21/23 1217 2 g at 03/21/23 1217   cyanocobalamin (VITAMIN B12) injection 1,000 mcg  1,000 mcg Intramuscular Daily Regalado, Belkys A, MD   1,000 mcg at 03/21/23 1211   famotidine (PEPCID) tablet 10 mg  10 mg Oral Daily Regalado, Belkys A, MD   10 mg at 03/21/23 1023   feeding supplement (BOOST / RESOURCE BREEZE) liquid 1 Container  1 Container Oral TID BM Regalado, Belkys A, MD   1 Container at 03/20/23 2000   furosemide (LASIX) injection 40 mg  40 mg Intravenous BID Yvonna Alanis L, PA-C   40 mg at 03/21/23 1205   HYDROmorphone (DILAUDID) tablet 1 mg  1 mg Oral Q4H PRN Romie Minus, MD   1 mg at 03/21/23 0154   lidocaine (LIDODERM) 5 % 1 patch  1 patch Transdermal Q24H Regalado, Belkys A, MD   1 patch at 03/20/23 0957   megestrol (MEGACE) tablet 160 mg  160 mg Oral Daily Regalado, Belkys A, MD   160 mg at 03/21/23 1025   polyethylene glycol (MIRALAX / GLYCOLAX) packet 17 g  17 g Oral Daily PRN Bobette Mo, MD   17 g at 03/21/23 0154   pregabalin (LYRICA) capsule 50 mg  50 mg Oral QHS Bobette Mo, MD   50 mg at 03/20/23 2226   senna-docusate (Senokot-S) tablet 1 tablet  1 tablet Oral BID Regalado, Belkys A, MD   1 tablet at 03/21/23 1023     Discharge Medications: Please see discharge  summary for a list of discharge medications.  Relevant Imaging Results:  Relevant Lab Results:   Additional Information SS#[id number] [phone], [place]legario Messier, [place]

## 2023-03-22 DIAGNOSIS — I480 Paroxysmal atrial fibrillation: Secondary | ICD-10-CM

## 2023-03-22 DIAGNOSIS — G9341 Metabolic encephalopathy: Secondary | ICD-10-CM | POA: Diagnosis not present

## 2023-03-22 DIAGNOSIS — S32000D Wedge compression fracture of unspecified lumbar vertebra, subsequent encounter for fracture with routine healing: Secondary | ICD-10-CM | POA: Diagnosis not present

## 2023-03-22 DIAGNOSIS — Z79899 Other long term (current) drug therapy: Secondary | ICD-10-CM

## 2023-03-22 DIAGNOSIS — I509 Heart failure, unspecified: Secondary | ICD-10-CM

## 2023-03-22 DIAGNOSIS — L03119 Cellulitis of unspecified part of limb: Secondary | ICD-10-CM

## 2023-03-22 DIAGNOSIS — Z515 Encounter for palliative care: Secondary | ICD-10-CM

## 2023-03-22 DIAGNOSIS — I5041 Acute combined systolic (congestive) and diastolic (congestive) heart failure: Secondary | ICD-10-CM

## 2023-03-22 DIAGNOSIS — Z7189 Other specified counseling: Secondary | ICD-10-CM

## 2023-03-22 LAB — CBC
HCT: 36.8 % (ref 36.0–46.0)
Hemoglobin: 12 g/dL (ref 12.0–15.0)
MCH: 30.5 pg (ref 26.0–34.0)
MCHC: 32.6 g/dL (ref 30.0–36.0)
MCV: 93.4 fL (ref 80.0–100.0)
Platelets: 342 10*3/uL (ref 150–400)
RBC: 3.94 MIL/uL (ref 3.87–5.11)
RDW: 15.6 % — ABNORMAL HIGH (ref 11.5–15.5)
WBC: 8.1 10*3/uL (ref 4.0–10.5)
nRBC: 0 % (ref 0.0–0.2)

## 2023-03-22 LAB — BASIC METABOLIC PANEL
Anion gap: 9 (ref 5–15)
BUN: 32 mg/dL — ABNORMAL HIGH (ref 8–23)
CO2: 29 mmol/L (ref 22–32)
Calcium: 9.2 mg/dL (ref 8.9–10.3)
Chloride: 101 mmol/L (ref 98–111)
Creatinine, Ser: 1.64 mg/dL — ABNORMAL HIGH (ref 0.44–1.00)
GFR, Estimated: 30 mL/min — ABNORMAL LOW (ref >60)
Glucose, Bld: 98 mg/dL (ref 70–99)
Potassium: 3.8 mmol/L (ref 3.5–5.1)
Sodium: 139 mmol/L (ref 135–145)

## 2023-03-22 LAB — CULTURE, BLOOD (ROUTINE X 2)
Culture: NO GROWTH
Culture: NO GROWTH

## 2023-03-22 LAB — GLUCOSE, CAPILLARY
Glucose-Capillary: 100 mg/dL — ABNORMAL HIGH (ref 70–99)
Glucose-Capillary: 103 mg/dL — ABNORMAL HIGH (ref 70–99)
Glucose-Capillary: 103 mg/dL — ABNORMAL HIGH (ref 70–99)
Glucose-Capillary: 107 mg/dL — ABNORMAL HIGH (ref 70–99)
Glucose-Capillary: 115 mg/dL — ABNORMAL HIGH (ref 70–99)
Glucose-Capillary: 123 mg/dL — ABNORMAL HIGH (ref 70–99)
Glucose-Capillary: 130 mg/dL — ABNORMAL HIGH (ref 70–99)

## 2023-03-22 LAB — VITAMIN B1: Vitamin B1 (Thiamine): 72.6 nmol/L (ref 66.5–200.0)

## 2023-03-22 MED ORDER — METOPROLOL SUCCINATE ER 50 MG PO TB24
50.0000 mg | ORAL_TABLET | Freq: Every day | ORAL | Status: DC
  Administered 2023-03-23: 50 mg via ORAL
  Filled 2023-03-22: qty 1

## 2023-03-22 MED ORDER — FUROSEMIDE 10 MG/ML IJ SOLN: 40.0000 mg | Freq: Two times a day (BID) | INTRAMUSCULAR | Status: DC

## 2023-03-22 NOTE — Progress Notes (Signed)
PROGRESS NOTE    Krystal Henderson  ION:[id number] DOB: April 16, 1932 DOA: 03/17/2023 PCP: Donato Schultz, DO   Brief Narrative:  89 year old with past medical history significant for anemia, angiodysplasia of the colon, anxiety, chronic lower back pain, diverticulosis, colon polyps, degenerative joint disease, GERD vertebral fracture, hypertension, reactive hypoglycemia, pelvic fracture, peripheral neuropathy, vitamin B-12 deficiency who was brought to the ED with altered mental status that is started the morning of admission.  She was alert and oriented only to person.   Patient admitted with acute metabolic encephalopathy, cellulitis of the right lower extremity. New reduce systolic HF.   Assessment & Plan:   Principal Problem:   Acute metabolic encephalopathy Active Problems:   HYPERLIPIDEMIA   HTN (hypertension)   GERD (gastroesophageal reflux disease)   Vitamin B12 deficiency   Peripheral neuropathy   Lumbar compression fracture (HCC)   Paroxysmal atrial fibrillation (HCC)   CKD (chronic kidney disease) stage 4, GFR 15-29 ml/min (HCC)   Heart failure with recovered ejection fraction (HFrecEF) (HCC)   Cellulitis of lower extremity   Atelectasis, bilateral   Sepsis with acute organ dysfunction without septic shock (HCC)   Pressure injury of skin  1-Acute metabolic encephalopathy: In the setting of lower extremity cellulitis, delirium, pain/B12 deficiency. -CT head no acute intracranial abnormality B 12 was low last week, she is on supplement. -TSH: 4.3, ,Cortisol level, ammonia: 11 , RPR: non reactive.  She is fully alert and oriented during my evaluation today.  Dysphagia: Patient was noted to have dysphagia by the nurse and palliative care today.  Consulting SLP.   Acute Systolic Heart failure:  ECHO 24/40: EF 35 % global hypokinesis.  Cardiology consulted.  They have started her on IV Lasix, they offered cardiac cath but due to her significant deconditioning, have  recommended palliative have firm goal of care with her before doing any intervention, in the meantime they are focusing on titrating GDMT once more euvolemic.   Lower extremity cellulitis Almost negligible erythema right lower extremity with mild warmth.  Continue Rocephin for now.  Monitor daily.   Lumbar compression fracture -CT Lumbar 12/06: Acute compression fracture of the T12 vertebral body with approximately 50% height loss both anterior and posteriorly. Chronic, although significantly increased high-grade wedge deformity of L1 when compared to recent radiographs dated 02/04/2023.  Now she has lidocaine patch as well. On calcitonin spray.  Will need follow-up with neurosurgery for further management.  Per husband, patient has seen Dr. Danielle Dess in the past and he is making phone calls to make an appointment.   Hypertension: Controlled, not on any medications other than IV Lasix.   GERD: -Pepcid.  Peripheral neuropathy:  Continue with pregabalin   CKD stage IV: Creatinine baseline: 1.6--1.9.  Currently at baseline.   Paroxysmal A-fib: -Continue with Eliquis.    Hyperlipidemia: Not on meds.  Follow up outpatient.   DVT prophylaxis: apixaban (ELIQUIS) tablet 2.5 mg Start: 03/17/23 2200   Code Status: Limited: Do not attempt resuscitation (DNR) -DNR-LIMITED -Do Not Intubate/DNI   Family Communication: Husband present at bedside.  Plan of care discussed with patient in length and he/she verbalized understanding and agreed with it.  Status is: Inpatient Remains inpatient appropriate because: Needs diuresis.  May be ready for discharge in next 1 to 2 days.   Estimated body mass index is 22.05 kg/m as calculated from the following:   Height as of this encounter: 5\' 5"  (1.651 m).   Weight as of this encounter: 60.1 kg.  Pressure Injury 03/17/23 Buttocks Right Stage 2 -  Partial thickness loss of dermis presenting as a shallow open injury with a red, pink wound bed without slough.  (Active)  03/17/23 2000  Location: Buttocks  Location Orientation: Right  Staging: Stage 2 -  Partial thickness loss of dermis presenting as a shallow open injury with a red, pink wound bed without slough.  Wound Description (Comments):   Present on Admission: Yes  Dressing Type Foam - Lift dressing to assess site every shift 03/22/23 0848     Pressure Injury 03/17/23 Sacrum Mid Stage 2 -  Partial thickness loss of dermis presenting as a shallow open injury with a red, pink wound bed without slough. (Active)  03/17/23 2000  Location: Sacrum  Location Orientation: Mid  Staging: Stage 2 -  Partial thickness loss of dermis presenting as a shallow open injury with a red, pink wound bed without slough.  Wound Description (Comments):   Present on Admission: Yes  Dressing Type Foam - Lift dressing to assess site every shift 03/22/23 0848     Pressure Injury 03/17/23 Vertebral column Lower;Mid Stage 1 -  Intact skin with non-blanchable redness of a localized area usually over a bony prominence. red ublanchable wound to spine X2 (2x1 and 1x1) (Active)  03/17/23 2000  Location: Vertebral column  Location Orientation: Lower;Mid  Staging: Stage 1 -  Intact skin with non-blanchable redness of a localized area usually over a bony prominence.  Wound Description (Comments): red ublanchable wound to spine X2 (2x1 and 1x1)  Present on Admission: Yes  Dressing Type Foam - Lift dressing to assess site every shift 03/22/23 0848   Nutritional Assessment: Body mass index is 22.05 kg/m.[name] Seen by dietician.  I agree with the assessment and plan as outlined below: Nutrition Status:        . Skin Assessment: I have examined the patient's skin and I agree with the wound assessment as performed by the wound care RN as outlined below: Pressure Injury 03/17/23 Buttocks Right Stage 2 -  Partial thickness loss of dermis presenting as a shallow open injury with a red, pink wound bed without slough. (Active)   03/17/23 2000  Location: Buttocks  Location Orientation: Right  Staging: Stage 2 -  Partial thickness loss of dermis presenting as a shallow open injury with a red, pink wound bed without slough.  Wound Description (Comments):   Present on Admission: Yes  Dressing Type Foam - Lift dressing to assess site every shift 03/22/23 0848     Pressure Injury 03/17/23 Sacrum Mid Stage 2 -  Partial thickness loss of dermis presenting as a shallow open injury with a red, pink wound bed without slough. (Active)  03/17/23 2000  Location: Sacrum  Location Orientation: Mid  Staging: Stage 2 -  Partial thickness loss of dermis presenting as a shallow open injury with a red, pink wound bed without slough.  Wound Description (Comments):   Present on Admission: Yes  Dressing Type Foam - Lift dressing to assess site every shift 03/22/23 0848     Pressure Injury 03/17/23 Vertebral column Lower;Mid Stage 1 -  Intact skin with non-blanchable redness of a localized area usually over a bony prominence. red ublanchable wound to spine X2 (2x1 and 1x1) (Active)  03/17/23 2000  Location: Vertebral column  Location Orientation: Lower;Mid  Staging: Stage 1 -  Intact skin with non-blanchable redness of a localized area usually over a bony prominence.  Wound Description (Comments): red ublanchable wound to spine  X2 (2x1 and 1x1)  Present on Admission: Yes  Dressing Type Foam - Lift dressing to assess site every shift 03/22/23 0848    Consultants:  Cardiology  Procedures:  As above  Antimicrobials:  Anti-infectives (From admission, onward)    Start     Dose/Rate Route Frequency Ordered Stop   03/17/23 1230  cefTRIAXone (ROCEPHIN) 2 g in sodium chloride 0.9 % 100 mL IVPB        2 g 200 mL/hr over 30 Minutes Intravenous Every 24 hours 03/17/23 1223 03/24/23 1229         Subjective: Patient seen and examined, husband at the bedside.  Patient is complaining of some back pain.  Otherwise no complaints.  No  shortness of breath.  Objective: Vitals:   03/21/23 2027 03/22/23 0421 03/22/23 0500 03/22/23 0922  BP: (!) 142/69 136/77  135/74  Pulse: 73 86  82  Resp: 20 16    Temp: 97.7 F (36.5 C) 98.7 F (37.1 C)    TempSrc: Oral Oral    SpO2: 100% 99%    Weight:   60.1 kg   Height:        Intake/Output Summary (Last 24 hours) at 03/22/2023 1111 Last data filed at 03/22/2023 6606 Gross per 24 hour  Intake 530 ml  Output 2650 ml  Net -2120 ml   Filed Weights   03/20/23 0453 03/21/23 0541 03/22/23 0500  Weight: 61.9 kg 61 kg 60.1 kg    Examination:  General exam: Appears calm and comfortable appears chronically sick and deconditioned Respiratory system: Clear to auscultation. Respiratory effort normal. Cardiovascular system: S1 & S2 heard, RRR. No JVD, murmurs, rubs, gallops or clicks. No pedal edema. Gastrointestinal system: Abdomen is nondistended, soft and nontender. No organomegaly or masses felt. Normal bowel sounds heard. Central nervous system: Alert and oriented. No focal neurological deficits. Extremities: Symmetric 5 x 5 power. Skin: Very minimal erythema right lower extremity.   Data Reviewed: I have personally reviewed following labs and imaging studies  CBC: Recent Labs  Lab 03/17/23 1220 03/18/23 0507 03/21/23 1132 03/22/23 0522  WBC 6.6 6.1 7.4 8.1  NEUTROABS 4.3  --   --   --   HGB 13.2 11.8* 12.1 12.0  HCT 40.5 37.3 37.1 36.8  MCV 94.4 95.9 94.2 93.4  PLT 324 285 327 342   Basic Metabolic Panel: Recent Labs  Lab 03/18/23 0507 03/19/23 0518 03/20/23 0519 03/21/23 1132 03/22/23 0522  NA 137 134* 135 136 139  K 4.4 3.9 4.0 4.1 3.8  CL 105 99 102 103 101  CO2 22 25 25 26 29   GLUCOSE 80 97 100* 114* 98  BUN 33* 30* 26* 27* 32*  CREATININE 2.04* 1.78* 1.56* 1.40* 1.64*  CALCIUM 8.8* 8.7* 8.7* 8.9 9.2   GFR: Estimated Creatinine Clearance: 21.3 mL/min (A) (by C-G formula based on SCr of 1.64 mg/dL (H)). Liver Function Tests: Recent Labs  Lab  03/17/23 1220 03/18/23 0507  AST 16 14*  ALT 20 17  ALKPHOS 81 69  BILITOT 1.0 0.7  PROT 7.5 6.3*  ALBUMIN 3.7 3.0*   No results for input(s): "LIPASE", "AMYLASE" in the last 168 hours. Recent Labs  Lab 03/18/23 1050  AMMONIA 11   Coagulation Profile: Recent Labs  Lab 03/17/23 1220  INR 2.0*   Cardiac Enzymes: No results for input(s): "CKTOTAL", "CKMB", "CKMBINDEX", "TROPONINI" in the last 168 hours. BNP (last 3 results) No results for input(s): "PROBNP" in the last 8760 hours. HbA1C: No results for  input(s): "HGBA1C" in the last 72 hours. CBG: Recent Labs  Lab 03/21/23 1604 03/21/23 2024 03/22/23 0010 03/22/23 0417 03/22/23 0719  GLUCAP 95 112* 103* 100* 107*   Lipid Profile: No results for input(s): "CHOL", "HDL", "LDLCALC", "TRIG", "CHOLHDL", "LDLDIRECT" in the last 72 hours. Thyroid Function Tests: No results for input(s): "TSH", "T4TOTAL", "FREET4", "T3FREE", "THYROIDAB" in the last 72 hours. Anemia Panel: No results for input(s): "VITAMINB12", "FOLATE", "FERRITIN", "TIBC", "IRON", "RETICCTPCT" in the last 72 hours. Sepsis Labs: Recent Labs  Lab 03/17/23 1437 03/21/23 1132  PROCALCITON  --  <0.10  LATICACIDVEN 1.1  --     Recent Results (from the past 240 hours)  Blood Culture (routine x 2)     Status: None   Collection Time: 03/17/23 12:15 PM   Specimen: BLOOD  Result Value Ref Range Status   Specimen Description   Final    BLOOD RIGHT ANTECUBITAL Performed at [facility], [address] [place], [place] [address]    Special Requests   Final    BOTTLES DRAWN AEROBIC AND ANAEROBIC Blood Culture results may not be optimal due to an inadequate volume of blood received in culture bottles Performed at [facility], [address] [place], [place] [address]    Culture   Final    NO GROWTH 5 DAYS Performed at [facility], [address] [place], [place] [address]    Report Status 03/22/2023 FINAL   Final  Blood Culture (routine x 2)     Status: None   Collection Time: 03/17/23 12:20 PM   Specimen: BLOOD  Result Value Ref Range Status   Specimen Description   Final    BLOOD BLOOD LEFT ARM Performed at [facility], [address], [place], [place] [address]    Special Requests   Final    BOTTLES DRAWN AEROBIC AND ANAEROBIC Blood Culture results may not be optimal due to an inadequate volume of blood received in culture bottles Performed at [facility], [address], [place], [place] [address]    Culture   Final    NO GROWTH 5 DAYS Performed at [facility], [address] [place], [place] [address]    Report Status 03/22/2023 FINAL  Final  Resp panel by RT-PCR (RSV, Flu A&B, Covid) Anterior Nasal Swab     Status: None   Collection Time: 03/17/23 12:22 PM   Specimen: Anterior Nasal Swab  Result Value Ref Range Status   SARS Coronavirus 2 by RT PCR NEGATIVE NEGATIVE Final    Comment: (NOTE) SARS-CoV-2 target nucleic acids are NOT DETECTED.  The SARS-CoV-2 RNA is generally detectable in upper respiratory specimens during the acute phase of infection. The lowest concentration of SARS-CoV-2 viral copies this assay can detect is 138 copies/mL. A negative result does not preclude SARS-Cov-2 infection and should not be used as the sole basis for treatment or other patient management decisions. A negative result may occur with  improper specimen collection/handling, submission of specimen other than nasopharyngeal swab, presence of viral mutation(s) within the areas targeted by this assay, and inadequate number of viral copies(<138 copies/mL). A negative result must be combined with clinical observations, patient history, and epidemiological information. The expected result is Negative.  Fact Sheet for Patients:  BloggerCourse.com  Fact Sheet for Healthcare Providers:   SeriousBroker.it  This test is no t yet approved or cleared by the [place] FDA and  has been authorized for detection and/or  diagnosis of SARS-CoV-2 by FDA under an Emergency Use Authorization (EUA). This EUA will remain  in effect (meaning this test can be used) for the duration of the COVID-19 declaration under Section 564(b)(1) of the Act, 21 U.S.C.section 360bbb-3(b)(1), unless the authorization is terminated  or revoked sooner.       Influenza A by PCR NEGATIVE NEGATIVE Final   Influenza B by PCR NEGATIVE NEGATIVE Final    Comment: (NOTE) The Xpert Xpress SARS-CoV-2/FLU/RSV plus assay is intended as an aid in the diagnosis of influenza from Nasopharyngeal swab specimens and should not be used as a sole basis for treatment. Nasal washings and aspirates are unacceptable for Xpert Xpress SARS-CoV-2/FLU/RSV testing.  Fact Sheet for Patients: BloggerCourse.com  Fact Sheet for Healthcare Providers: SeriousBroker.it  This test is not yet approved or cleared by the [place] FDA and has been authorized for detection and/or diagnosis of SARS-CoV-2 by FDA under an Emergency Use Authorization (EUA). This EUA will remain in effect (meaning this test can be used) for the duration of the COVID-19 declaration under Section 564(b)(1) of the Act, 21 U.S.C. section 360bbb-3(b)(1), unless the authorization is terminated or revoked.     Resp Syncytial Virus by PCR NEGATIVE NEGATIVE Final    Comment: (NOTE) Fact Sheet for Patients: BloggerCourse.com  Fact Sheet for Healthcare Providers: SeriousBroker.it  This test is not yet approved or cleared by the [place] FDA and has been authorized for detection and/or diagnosis of SARS-CoV-2 by FDA under an Emergency Use Authorization (EUA). This EUA will remain in effect (meaning this test can be used) for  the duration of the COVID-19 declaration under Section 564(b)(1) of the Act, 21 U.S.C. section 360bbb-3(b)(1), unless the authorization is terminated or revoked.  Performed at [facility], [address], [place], [place] [address]      Radiology Studies: No results found.  Scheduled Meds:  apixaban  2.5 mg Oral BID   vitamin C  1,000 mg Oral Daily   calcitonin (salmon)  1 spray Alternating Nares Daily   famotidine  10 mg Oral Daily   feeding supplement  1 Container Oral TID BM   furosemide  40 mg Intravenous BID   lidocaine  1 patch Transdermal Q24H   megestrol  160 mg Oral Daily   metoprolol succinate  25 mg Oral Daily   pregabalin  50 mg Oral QHS   senna-docusate  1 tablet Oral BID   Continuous Infusions:  cefTRIAXone (ROCEPHIN)  IV 2 g (03/21/23 1217)     LOS: 5 days   Hughie Closs, MD [facility]  03/22/2023, 11:11 AM   *Please note that this is a verbal dictation therefore any spelling or grammatical errors are due to the "Dragon Medical One" system interpretation.  Please page via Amion and do not message via secure chat for urgent patient care matters. Secure chat can be used for non urgent patient care matters.  How to contact the [facility] Attending or Consulting provider 7A - 7P or covering provider during after hours 7P -7A, for this patient?  Check the care team in [facility] and look for a) attending/consulting TRH provider listed and b) the [facility] team listed. Page or secure chat 7A-7P. Log into [email or web address] and use [place]'s universal password to access. If you do not have the password, please contact the hospital operator. Locate the [facility] provider you are looking for under [facility] and page to a number that you can be directly reached. If you still have  difficulty reaching the provider, please page the [facility] (Director on Call) for the Hospitalists listed on amion for assistance.

## 2023-03-22 NOTE — Progress Notes (Signed)
Daily Progress Note   Patient Name: Krystal Henderson       Date: 03/22/2023 DOB: [birth year]  Age: 89 y.o. MRN#: [id number] Attending Physician: Hughie Closs, MD Primary Care Physician: Zola Button, Grayling Congress, DO Admit Date: 03/17/2023 Length of Stay: 5 days  Reason for Consultation/Follow-up: Pain control  Subjective:   CC: Patient agreed with having swallowing issues currently and to a lesser extent prior to hospitalization.  Following up regarding complex medical decision making.  Subjective:  Reviewed EMR prior to presenting to bedside.  Discussed care with RN for medical updates.  RN noted patient having coughing fits with a water and attempts to eat eggs this morning which required suctioning.  Presented to bedside to meet with patient.  No family present at bedside.  Patient laying in bed while coming visit.  Inquired how patient was feeling at this time and she noted that she does not feel well overall.  Inquired about issues with dysphagia.  Patient noted that she is having issues with swallowing at this time.  Patient notes that she had issues with swallowing at home though not as much as she does currently.  Even during discussion, patient noting to have coughing fits when trying to swallow.  Discussed likely need for speech evaluation.  Patient agreement with this plan.  Also discussed patient's pain at this time.  Patient's pain is from lumbar compression fracture.  Discussed unfortunate this is a chronic pain.  Noted usually takes surgical interventions for management which concerned patient is not appropriate for due to her deconditioned state.  Encouraged use of position and strengthening as able to allow support for pain.  All questions answered at that time.  Noted palliative medicine team to continue to follow along with patient's medical journey.  Objective:   Vital Signs:  BP 136/77 (BP Location: Left Arm)   Pulse 86   Temp 98.7 F (37.1 C) (Oral)   Resp 16   Ht  5\' 5"  (1.651 m)   Wt 60.1 kg   SpO2 99%   BMI 22.05 kg/m   Physical Exam: General: NAD, awake, laying in bed, chronically ill-appearing, frail Cardiovascular: RRR Respiratory: no increased work of breathing noted, not in respiratory distress Neuro: Awake, interactive  Imaging: I personally reviewed recent imaging.   Assessment & Plan:   Assessment: Patient is an 89 year old female with a past medical history of paroxysmal A-fib, new onset acute heart failure with reduced ejection fraction of 30-35%, CKD, and pain secondary to compression fracture who was admitted on 03/17/2023 for management of change in mental status.  Palliative medicine team consulted to assist with pain control.  Recommendations/Plan: # Complex medical decision making/goals of care:  - Patient has been having delirium at times.  Patient's husband, Peyton Najjar, discussing potential discharge to skilled nursing facility for rehab as per PT recommendations.  -Patient noted to have issues with dysphagia at this time.  Awaiting SLP evaluation.  -  Code Status: Limited: Do not attempt resuscitation (DNR) -DNR-LIMITED -Do Not Intubate/DNI   # Symptom management:  -Back pain, acute on chronic in setting of compression fracture of the T12 vertebrae   -Received Tylenol 1000 mg scheduled from 12/14-16. Could consider continuing if appropraite   -Continue lidocaine patch   -Continue Dilaudid 1 mg p.o. every 4 hours as needed   -Discontinue oxycodone as does not need to be receiving to short acting oral opioid medications at 1 time -Agree with follow-up with neurosurgery as able   Upon review of  EMR noted patient was started on Megace 160 mg daily.  Concerned regarding risks of medication in comparison to benefits in geriatric patient population.  Discussed with hospitalist and would recommended discontinuing if appropriate from hospitalist's medical perspective.  # Psychosocial Support:  -Husband  # Discharge Planning: To Be  Determined  Discussed with: patient, RN, hospitalist   Thank you for allowing the palliative care team to participate in the care Krystal Henderson.  Alvester Morin, DO Palliative Care Provider PMT # [phone]  If patient remains symptomatic despite maximum doses, please call PMT at [phone] between 0700 and 1900. Outside of these hours, please call attending, as PMT does not have night coverage.  *Please note that this is a verbal dictation therefore any spelling or grammatical errors are due to the "[employer]agon [employer] One" system interpretation.

## 2023-03-22 NOTE — Plan of Care (Signed)
  Problem: Health Behavior/Discharge Planning: Goal: Ability to manage health-related needs will improve Outcome: Progressing   Problem: Clinical Measurements: Goal: Ability to maintain clinical measurements within normal limits will improve Outcome: Progressing Goal: Will remain free from infection Outcome: Progressing Goal: Diagnostic test results will improve Outcome: Progressing Goal: Respiratory complications will improve Outcome: Progressing Goal: Cardiovascular complication will be avoided Outcome: Progressing   Problem: Activity: Goal: Risk for activity intolerance will decrease Outcome: Progressing   Problem: Nutrition: Goal: Adequate nutrition will be maintained Outcome: Progressing   Problem: Coping: Goal: Level of anxiety will decrease Outcome: Progressing   Problem: Elimination: Goal: Will not experience complications related to bowel motility Outcome: Progressing Goal: Will not experience complications related to urinary retention Outcome: Progressing   Problem: Pain Management: Goal: General experience of comfort will improve Outcome: Progressing   Problem: Safety: Goal: Ability to remain free from injury will improve Outcome: Progressing   Problem: Skin Integrity: Goal: Risk for impaired skin integrity will decrease Outcome: Progressing   Problem: Education: Goal: Ability to demonstrate management of disease process will improve Outcome: Progressing Goal: Ability to verbalize understanding of medication therapies will improve Outcome: Progressing Goal: Individualized Educational Video(s) Outcome: Progressing   Problem: Activity: Goal: Capacity to carry out activities will improve Outcome: Progressing   Problem: Cardiac: Goal: Ability to achieve and maintain adequate cardiopulmonary perfusion will improve Outcome: Progressing

## 2023-03-22 NOTE — Progress Notes (Signed)
Clinical/Bedside Swallow Evaluation Patient Details  Name: Krystal Henderson MRN: [id number] Date of Birth: Jan 25, 1933  Today's Date: 03/22/2023 Time: SLP Start Time (ACUTE ONLY): 1715 SLP Stop Time (ACUTE ONLY): 1800 SLP Time Calculation (min) (ACUTE ONLY): 45 min  Past Medical History:  Past Medical History:  Diagnosis Date   Anemia    hx of    Angiodysplasia 2007   @ colonoscopy   Anxiety    PMH of   Chronic low back pain 06/21/2014   Diverticulosis of colon (without mention of hemorrhage)    DJD (degenerative joint disease)    Esophageal reflux    inactive   History of vertebral fracture 11/2015   HTN (hypertension)    Hyperlipemia 2006   LDL 130   Hypoglycemia    reactive   Pelvic fracture (HCC) 12/30/2011   [facility]   Peripheral neuropathy    Personal history of colonic polyps    adenomatous   Vitamin B12 deficiency    Past Surgical History:  Past Surgical History:  Procedure Laterality Date   cataract surgery  12-26-12 and 01-09-13   COLONOSCOPY  2011   neg   COLONOSCOPY W/ POLYPECTOMY     X 2 , Dr  Sheryn Bison; angiodysplasia. Due 2022   DILATION AND CURETTAGE OF UTERUS     FACIAL COSMETIC SURGERY     HEMORROIDECTOMY     LUMBAR LAMINECTOMY/DECOMPRESSION MICRODISCECTOMY  09/10/2011   Procedure: LUMBAR LAMINECTOMY/DECOMPRESSION MICRODISCECTOMY;  Surgeon: Javier Docker, MD;  Location: [facility];  Service: Orthopedics;  Laterality: N/A;  decompression laminectomy L2-3, L3-4, L4-5   ORIF WRIST FRACTURE  01/11/2012   Procedure: OPEN REDUCTION INTERNAL FIXATION (ORIF) WRIST FRACTURE;  Surgeon: Tami Ribas, MD;  Location: [facility];  Service: Orthopedics;  Laterality: Right;   TEAR DUCT PROBING     X 2   TOTAL HIP ARTHROPLASTY  2000   right   TUBAL LIGATION     HPI:  Pt is 89 yo female presented on 03/17/23 with AMS.  Pt with acute metabolic encephalopathy in setting of cellulitis of LE.   Pt recently in ED on 12/6 with compression fx T12  and chronic L1.  Per notes patient was to wear a brace as neurosurgery suggested but spouse reported she had not been wearing it (pt kyphotic) and pt not wearing at home.  Pt with hx including but not limited to anemia, angiodysplasia of the colon, anxiety, chronic lower back pain, diverticulosis, colon polyps, degenerative joint disease, GERD, history of vertebral fracture, hypertension, hyperlipidemia, reactive hypoglycemia, pelvic fracture, peripheral neuropathy, vitamin B12 deficiency.  RN noted today patient was coughing with eggs and water requiring suctioning thus swallow evaluation ordered.  Patient admits to some issues with dysphagia prior to admission causing her to cough with intake.  Her voice is hoarse and she reports this has been present for a long time.  She also endorses some weight loss which she attributes some to poor appetite but also dysphagia.  Patient is on famotidine, denies reflux issues.    Assessment / Plan / Recommendation  Clinical Impression  Patient demonstrates clinical signs and symptoms of acute on chronic dysphagia with likely exacerbation from deconditioning.  She remains with open mouth posture which contributes to excessive xerostomia.  No focal cranial nerve deficits apparent.  P.o. trials including [place] ice, water, Ensure, tomato soup,  cooked carrots and fish were provided.  Patient demonstrates multiple subswallows with each bolus concerning for potential pharyngeal and/or esophageal retention.  Multiple swallows followed by intermittent throat clearing and cough.  Her cough is attenuated due to her discomfort but she was able to cough and expectorate a minimal amount of [place] ice with secretions.    Patient's intake was inadequate including only few bites and sips except for consumption of an entire [place] ice.  Recommend softer diet given her generalized deconditioning and starting all intake with liquids due to xerostomia.  Advised patient masticate food  until its pulverized and recommend she cough and expectorate as needed.    Suspect this chronic dysphagia has contributed to some of her weight loss and is concerning for adequacy of nutrition.    SLP will follow-up to educate family and patient and to provide respiratory muscle strength training to improve airway and pharyngeal clearance.     MBS could be conducted for this patient but doubtful she would tolerate it well given extent of her discomfort.  An MBS would only be conducted to allow prescription of appropriate exercises to strengthen swallow musculature and decrease in burden of patient's dysphagia.  Thanks for this consult   SLP Visit Diagnosis: Dysphagia, unspecified (R13.10)    Aspiration Risk  Risk for inadequate nutrition/hydration;Moderate aspiration risk    Diet Recommendation Regular;Dysphagia 3 (Mech soft);Thin liquid    Liquid Administration via: Cup;Straw (Can use tip of straw if needed) Medication Administration:  (As tolerated but consider with applesauce) Supervision: Full supervision/cueing for compensatory strategies Compensations: Slow rate;Small sips/bites (Start intake with liquid) Postural Changes: Seated upright at 90 degrees;Remain upright for at least 30 minutes after po intake    Other  Recommendations Oral Care Recommendations: Oral care BID (Remove dentures and brush gums and tongue nightly)    Recommendations for follow up therapy are one component of a multi-disciplinary discharge planning process, led by the attending physician.  Recommendations may be updated based on patient status, additional functional criteria and insurance authorization.  Follow up Recommendations        Assistance Recommended at Discharge    Functional Status Assessment Patient has had a recent decline in their functional status and/or demonstrates limited ability to make significant improvements in function in a reasonable and predictable amount of time  Frequency and  Duration min 1 x/week  1 week       Prognosis Prognosis for improved oropharyngeal function: Fair Barriers to Reach Goals: Other (Comment);Time post onset (Time post onset, attenuation of cough and decreased mobility due to spinal fractures, pain)      Swallow Study   General HPI: Pt is 89 yo female presented on 03/17/23 with AMS.  Pt with acute metabolic encephalopathy in setting of cellulitis of LE.   Pt recently in ED on 12/6 with compression fx T12 and chronic L1.  Per notes patient was to wear a brace as neurosurgery suggested but spouse reported she had not been wearing it (pt kyphotic) and pt not wearing at home.  Pt with hx including but not limited to anemia, angiodysplasia of the colon, anxiety, chronic lower back pain, diverticulosis, colon polyps, degenerative joint disease, GERD, history of vertebral fracture, hypertension, hyperlipidemia, reactive hypoglycemia, pelvic fracture, peripheral neuropathy, vitamin B12 deficiency.  RN noted today patient was coughing with eggs and water requiring suctioning thus swallow evaluation ordered.  Patient admits to some issues with dysphagia prior to admission causing her to cough with intake.  Her voice is hoarse and she reports this has been present for a long time.  She also endorses some weight loss which she attributes  some to poor appetite but also dysphagia.  Patient is on famotidine, denies reflux issues. Type of Study: Bedside Swallow Evaluation Previous Swallow Assessment: none in the system - Diet Prior to this Study: Regular;Clear liquid diet Temperature Spikes Noted: No Respiratory Status: Nasal cannula History of Recent Intubation: No Behavior/Cognition: Alert;Cooperative;Other (Comment);Distractible Oral Cavity Assessment: Dry Oral Care Completed by SLP: No Oral Cavity - Dentition: Dentures, top;Dentures, bottom Self-Feeding Abilities: Needs set up;Needs assist (Needs assist with those items requiring utensils, able to hold cup  and drink from a straw but this requires effort) Patient Positioning: Upright in bed Volitional Cough: Weak;Other (Comment) (Patient tended with cough due to pain from her fractures) Volitional Swallow: Able to elicit    Oral/Motor/Sensory Function Overall Oral Motor/Sensory Function: Generalized oral weakness Facial Symmetry:  (Patient sits with open mouth posture)   Ice Chips Ice chips: Within functional limits (Italian ice) Presentation: Spoon;Cup (Straw)   Thin Liquid Thin Liquid: Impaired Presentation: Spoon;Straw;Self Fed (Assist to provide liquids from the tip of the straw) Pharyngeal  Phase Impairments: Suspected delayed Swallow;Multiple swallows;Throat Clearing - Immediate;Cough - Delayed    Nectar Thick Nectar Thick Liquid: Impaired (Ensure) Pharyngeal Phase Impairments: Suspected delayed Swallow;Multiple swallows;Throat Clearing - Delayed Other Comments: Ensure and soup   Honey Thick Honey Thick Liquid: Not tested   Puree Puree: Impaired (Applesauce) Presentation: Spoon Pharyngeal Phase Impairments: Suspected delayed Swallow;Multiple swallows   Solid     Solid: Within functional limits Presentation: Spoon (Cooked carrot, fish bolus)      Krystal Henderson 03/22/2023,6:33 PM   Krystal Infante, MS [facility] SLP Acute Rehab Services Office [phone]

## 2023-03-22 NOTE — Plan of Care (Signed)
°  Problem: Clinical Measurements: °Goal: Ability to maintain clinical measurements within normal limits will improve °Outcome: Progressing °Goal: Will remain free from infection °Outcome: Progressing °Goal: Diagnostic test results will improve °Outcome: Progressing °Goal: Respiratory complications will improve °Outcome: Progressing °  °

## 2023-03-22 NOTE — TOC Progression Note (Signed)
Transition of Care [facility]) - Progression Note    Patient Details  Name: Krystal Henderson MRN: [id number] Date of Birth: [birth year]  Transition of Care [facility]) CM/SW Contact  Heer Justiss, Olegario Messier, RN Phone Number: 03/22/2023, 12:33 PM  Clinical Narrative: [facility] rep Willette accepted-Larry(spouse) chose [facility]. Initiated Berkley Harvey pend [id number] auth.       Expected Discharge Plan: Skilled Nursing Facility Barriers to Discharge: Insurance Authorization  Expected Discharge Plan and Services   Discharge Planning Services: CM Consult Post Acute Care Choice: Skilled Nursing Facility Living arrangements for the past 2 months: Skilled Nursing Facility                                       Social Determinants of Health (SDOH) Interventions SDOH Screenings   Food Insecurity: No Food Insecurity (03/17/2023)  Housing: Low Risk  (03/17/2023)  Transportation Needs: No Transportation Needs (03/17/2023)  Utilities: Not At Risk (03/17/2023)  Alcohol Screen: Low Risk  (02/22/2023)  Depression (PHQ2-9): Low Risk  (02/24/2023)  Financial Resource Strain: Low Risk  (02/22/2023)  Physical Activity: Unknown (02/22/2023)  Social Connections: Moderately Isolated (02/22/2023)  Stress: No Stress Concern Present (02/22/2023)  Tobacco Use: Low Risk  (03/17/2023)    Readmission Risk Interventions     No data to display

## 2023-03-22 NOTE — Progress Notes (Addendum)
Patient Name: Krystal Henderson Date of Encounter: 03/22/2023 [facility] Cardiologist: Will Jorja Loa, MD   Interval Summary  .    Difficult to understand however mentation may be better, it waxes and wanes though per nurse.  She is diuresing well.  No obvious complaints, husband is not in the room today.  She is having swallowing issues.  May need speech evaluation.  Vital Signs .    Vitals:   03/21/23 2027 03/22/23 0421 03/22/23 0500 03/22/23 0922  BP: (!) 142/69 136/77  135/74  Pulse: 73 86  82  Resp: 20 16    Temp: 97.7 F (36.5 C) 98.7 F (37.1 C)    TempSrc: Oral Oral    SpO2: 100% 99%    Weight:   60.1 kg   Height:        Intake/Output Summary (Last 24 hours) at 03/22/2023 1005 Last data filed at 03/22/2023 5643 Gross per 24 hour  Intake 530 ml  Output 2650 ml  Net -2120 ml      03/22/2023    5:00 AM 03/21/2023    5:41 AM 03/20/2023    4:53 AM  Last 3 Weights  Weight (lbs) 132 lb 7.9 oz 134 lb 7.7 oz 136 lb 7.4 oz  Weight (kg) 60.1 kg 61 kg 61.9 kg      Telemetry/ECG    Atrial fibrillation heart rates between 90-100.- Personally Reviewed  CV Studies    Echocardiogram 03/19/2023 1. Left ventricular ejection fraction, by estimation, is 30 to 35%. The  left ventricle has moderately decreased function. The left ventricle  demonstrates global hypokinesis. Left ventricular diastolic function could  not be evaluated. Elevated left  atrial pressure. The E/e' is 21.   2. Right ventricular systolic function is low normal. The right  ventricular size is mildly enlarged. There is severely elevated pulmonary  artery systolic pressure. The estimated right ventricular systolic  pressure is 68.3 mmHg.   3. Left atrial size was severely dilated.   4. Right atrial size was moderately dilated.   5. The mitral valve is normal in structure. Severe mitral valve  regurgitation. No evidence of mitral stenosis.   6. Tricuspid valve regurgitation is  moderate.   7. The aortic valve is normal in structure. Aortic valve regurgitation is  moderate. No aortic stenosis is present.   8. There is dilatation of the ascending aorta, measuring 40 mm.   9. The inferior vena cava is dilated in size with <50% respiratory  variability, suggesting right atrial pressure of 15 mmHg.   Comparison(s): A prior study was performed on 07/2021. LVEF has reduced  from 50-55% to 30-35%, MR and TR have progressed, and estimated RAP now  increased to .   Physical Exam .   GEN: Deconditioned, slightly confused Neck: + JVD Cardiac: Irregularly irregular Respiratory: Slight crackles in the bases, much improved GI: Soft, nontender, non-distended  MS: No edema  Patient Profile    Krystal Henderson is a 89 y.o. female has hx of  chronic HFpEF, PAF, PE 2021, CKD, deconditioning, HTN, angiodysplasia of the colon, chronic lower back pain, degenerative joint disease, GERD vertebral fracture  anemia, peripheral neuropathy  and admitted on 03/17/2023 for the evaluation of altered mental status, back pain secondary to compression fracture, weakness and cellulitis.  Cardiology seen for newly reduced EF.  Assessment & Plan .     New onset acute HFrEF 30-35% Echo this admission shows EF 30 to 35% with global hypokinesis.  Low  normal RV function.  Severely elevated RVSP 68.3.  Severe MR, moderate TR, right atrial pressure 15.  The LVEF, RVSP, MR is significantly worse from prior reading a year ago.    Diuresing well, -2 L in the last 24 hours.  Lung sounds are much improved.  Suspect 1 more day of IV diuretics.  Will stop further diuresis after today's dose and reassess renal function/volume status tomorrow Continue IV Lasix 40 mg as above.  Strict I's and O's and daily weights.. Discussed with husband about further consideration of ischemic evaluation, he would be inclined. However, she has advanced age and seems to be deconditioned and declining.  Recommend palliative  care to continue to assess goals of care. Toprol-XL 25 mg was added yesterday May consider SGLT2 inhibitor, mentation may contribute this if not able to express symptoms.  Further GDMT limited by renal function.  PAF Decently rate controlled right now not on any rate control medications from prior visit in October.  Avoid Dilt with reduced EF.   Continue reduced dose of Eliquis 2.5 mg for age and CKD.  Toprol-XL 25 mg   Acute metabolic encephalopathy Cellulitis and compression fracture Likely in the setting of cellulitis, pain secondary to compression fracture.  Management per primary team   CKD CRS, has been improving with diuresis.  Creatinine on admission 2.21.  Slight increase from creatinine yesterday 1.4-1.64 today.  Likely can stop diuresis tomorrow.  Goals of care Palliative care following For questions or updates, please contact  [facility] Please consult [email or web address] for contact info under        Signed, Abagail Kitchens, PA-C

## 2023-03-23 DIAGNOSIS — G9341 Metabolic encephalopathy: Secondary | ICD-10-CM | POA: Diagnosis not present

## 2023-03-23 DIAGNOSIS — L89312 Pressure ulcer of right buttock, stage 2: Secondary | ICD-10-CM

## 2023-03-23 DIAGNOSIS — R4589 Other symptoms and signs involving emotional state: Secondary | ICD-10-CM

## 2023-03-23 DIAGNOSIS — I5041 Acute combined systolic (congestive) and diastolic (congestive) heart failure: Secondary | ICD-10-CM | POA: Diagnosis not present

## 2023-03-23 DIAGNOSIS — Z515 Encounter for palliative care: Secondary | ICD-10-CM | POA: Diagnosis not present

## 2023-03-23 DIAGNOSIS — N184 Chronic kidney disease, stage 4 (severe): Secondary | ICD-10-CM

## 2023-03-23 DIAGNOSIS — Z7189 Other specified counseling: Secondary | ICD-10-CM

## 2023-03-23 DIAGNOSIS — I480 Paroxysmal atrial fibrillation: Secondary | ICD-10-CM | POA: Diagnosis not present

## 2023-03-23 DIAGNOSIS — I509 Heart failure, unspecified: Secondary | ICD-10-CM | POA: Diagnosis not present

## 2023-03-23 DIAGNOSIS — L03119 Cellulitis of unspecified part of limb: Secondary | ICD-10-CM | POA: Diagnosis not present

## 2023-03-23 LAB — BASIC METABOLIC PANEL
Anion gap: 10 (ref 5–15)
BUN: 33 mg/dL — ABNORMAL HIGH (ref 8–23)
CO2: 29 mmol/L (ref 22–32)
Calcium: 9.2 mg/dL (ref 8.9–10.3)
Chloride: 98 mmol/L (ref 98–111)
Creatinine, Ser: 1.71 mg/dL — ABNORMAL HIGH (ref 0.44–1.00)
GFR, Estimated: 28 mL/min — ABNORMAL LOW (ref >60)
Glucose, Bld: 94 mg/dL (ref 70–99)
Potassium: 3.8 mmol/L (ref 3.5–5.1)
Sodium: 137 mmol/L (ref 135–145)

## 2023-03-23 LAB — CBC
HCT: 37.7 % (ref 36.0–46.0)
Hemoglobin: 12.2 g/dL (ref 12.0–15.0)
MCH: 30.3 pg (ref 26.0–34.0)
MCHC: 32.4 g/dL (ref 30.0–36.0)
MCV: 93.5 fL (ref 80.0–100.0)
Platelets: 336 10*3/uL (ref 150–400)
RBC: 4.03 MIL/uL (ref 3.87–5.11)
RDW: 15.8 % — ABNORMAL HIGH (ref 11.5–15.5)
WBC: 7.4 10*3/uL (ref 4.0–10.5)
nRBC: 0 % (ref 0.0–0.2)

## 2023-03-23 LAB — GLUCOSE, CAPILLARY
Glucose-Capillary: 118 mg/dL — ABNORMAL HIGH (ref 70–99)
Glucose-Capillary: 96 mg/dL (ref 70–99)
Glucose-Capillary: 101 mg/dL — ABNORMAL HIGH (ref 70–99)
Glucose-Capillary: 103 mg/dL — ABNORMAL HIGH (ref 70–99)
Glucose-Capillary: 101 mg/dL — ABNORMAL HIGH (ref 70–99)

## 2023-03-23 MED ORDER — GLYCOPYRROLATE 0.2 MG/ML IJ SOLN: 0.2000 mg | INTRAMUSCULAR | Status: DC | PRN

## 2023-03-23 MED ORDER — OXYCODONE HCL 20 MG/ML PO CONC: 5.0000 mg | ORAL | Status: DC | PRN

## 2023-03-23 MED ORDER — GLYCOPYRROLATE 1 MG PO TABS: 1.0000 mg | ORAL_TABLET | ORAL | Status: DC | PRN

## 2023-03-23 MED ORDER — HALOPERIDOL LACTATE 5 MG/ML IJ SOLN: 0.5000 mg | INTRAMUSCULAR | Status: DC | PRN

## 2023-03-23 MED ORDER — HYDROMORPHONE HCL 1 MG/ML PO LIQD
1.0000 mg | ORAL | Status: DC | PRN
  Administered 2023-03-23: 1 mg via ORAL
  Filled 2023-03-23 (×2): qty 1

## 2023-03-23 MED ORDER — HALOPERIDOL LACTATE 2 MG/ML PO CONC: 0.5000 mg | ORAL | Status: DC | PRN

## 2023-03-23 MED ORDER — FUROSEMIDE 40 MG PO TABS
40.0000 mg | ORAL_TABLET | Freq: Every day | ORAL | Status: DC
  Administered 2023-03-23: 40 mg via ORAL
  Filled 2023-03-23: qty 1

## 2023-03-23 MED ORDER — HALOPERIDOL 0.5 MG PO TABS: 0.5000 mg | ORAL_TABLET | ORAL | Status: DC | PRN

## 2023-03-23 MED ORDER — HYDROMORPHONE HCL 1 MG/ML IJ SOLN
0.5000 mg | INTRAMUSCULAR | Status: DC | PRN
  Administered 2023-03-23: 0.5 mg via INTRAVENOUS
  Filled 2023-03-23: qty 0.5

## 2023-03-23 MED ORDER — BIOTENE DRY MOUTH MT LIQD: 15.0000 mL | OROMUCOSAL | Status: DC | PRN

## 2023-03-23 MED ORDER — POLYVINYL ALCOHOL 1.4 % OP SOLN: 1.0000 [drp] | Freq: Four times a day (QID) | OPHTHALMIC | Status: DC | PRN

## 2023-03-23 NOTE — Progress Notes (Signed)
Speech Language Pathology Treatment: Dysphagia  Patient Details Name: Krystal Henderson MRN: [id number] DOB: [birth year] Today's Date: 03/23/2023 Time: 1000-1005 SLP Time Calculation (min) (ACUTE ONLY): 5 min  Assessment / Plan / Recommendation Clinical Impression  Pt seen for ongoing dysphagia management.  RN reports pt coughing with meds in puree both whole and crushed.  Pt with open mouth posture on arrival, xerostomia.  Stated "help me" and requested repositioning 2/2 back pain.  RN had recently given pain medication.  Pt was unable to siphon from straw.  Suspect reduced labial seal.  Pt is a poor candidate for RMST or general swallowing therapy.  Pt took one cup sip of thin liquid following by immediate wet gurgling sounds.  There was very weak cough after.  Pt does not appear to be protecting her airway.  I do not believe pt is safe for any PO intake.  Pt refused all other PO intake. "Don't give me any more water!" She says she's had enough applesauce and she refused trials of modified consistencies to see if it would prevent coughing with liquids.  Unfortunately given severity of back pain, pt would likely not tolerate a MBSS very well.  FEES (endoscopic bedside swallow study) could be completed at [facility] if it were necessary to inform decision making and pt consents.  Palliative care is following.  Consider continuing discussions regarding goals of care.   Recommend pt be NPO.  Consider alternate means of nutrition if within scope of care.      HPI HPI: Pt is 89 yo female presented on 03/17/23 with AMS.  Pt with acute metabolic encephalopathy in setting of cellulitis of LE.   Pt recently in ED on 12/6 with compression fx T12 and chronic L1.  Per notes patient was to wear a brace as neurosurgery suggested but spouse reported she had not been wearing it (pt kyphotic) and pt not wearing at home.  Pt with hx including but not limited to anemia, angiodysplasia of the colon, anxiety, chronic lower back  pain, diverticulosis, colon polyps, degenerative joint disease, GERD, history of vertebral fracture, hypertension, hyperlipidemia, reactive hypoglycemia, pelvic fracture, peripheral neuropathy, vitamin B12 deficiency.  RN noted today patient was coughing with eggs and water requiring suctioning thus swallow evaluation ordered.  Patient admits to some issues with dysphagia prior to admission causing her to cough with intake.  Her voice is hoarse and she reports this has been present for a long time.  She also endorses some weight loss which she attributes some to poor appetite but also dysphagia.  Patient is on famotidine, denies reflux issues.      SLP Plan  Continue with current plan of care      Recommendations for follow up therapy are one component of a multi-disciplinary discharge planning process, led by the attending physician.  Recommendations may be updated based on patient status, additional functional criteria and insurance authorization.    Recommendations  Diet recommendations: NPO Medication Administration: Via alternative means                  Oral care QID     Dysphagia, unspecified (R13.10)     Continue with current plan of care     Kerrie Pleasure, MA, CCC-SLP Acute Rehabilitation Services Office: [phone] 03/23/2023, 10:12 AM

## 2023-03-23 NOTE — TOC Progression Note (Addendum)
Transition of Care [facility]) - Progression Note    Patient Details  Name: Krystal Henderson MRN: [id number] Date of Birth: 04-29-1932  [facility]) CM/SW Contact  Ocean Schildt, Olegario Messier, RN Phone Number: 03/23/2023, 11:15 AM  Clinical Narrative: [facility] able to accept today, we have auth-auth [id number].Plan Berkley Harvey GE#[id number]. Noted cardio following, may have palliative cons. Continue to monitor -1:23p Referral for home hospice-[facility] chosen by spouse-await eval.   Expected Discharge Plan: Skilled Nursing Facility Barriers to Discharge: Continued Medical Work up  Expected Discharge Plan and Services   Discharge Planning Services: CM Consult Post Acute Care Choice: Skilled Nursing Facility Living arrangements for the past 2 months: Skilled Nursing Facility                                       Social Determinants of Health (SDOH) Interventions SDOH Screenings   Food Insecurity: No Food Insecurity (03/17/2023)  Housing: Low Risk  (03/17/2023)  Transportation Needs: No Transportation Needs (03/17/2023)  Utilities: Not At Risk (03/17/2023)  Alcohol Screen: Low Risk  (02/22/2023)  Depression (PHQ2-9): Low Risk  (02/24/2023)  Financial Resource Strain: Low Risk  (02/22/2023)  Physical Activity: Unknown (02/22/2023)  Social Connections: Moderately Isolated (02/22/2023)  Stress: No Stress Concern Present (02/22/2023)  Tobacco Use: Low Risk  (03/17/2023)    Readmission Risk Interventions     No data to display

## 2023-03-23 NOTE — Plan of Care (Signed)
  Problem: Clinical Measurements: Goal: Diagnostic test results will improve Outcome: Progressing   Problem: Coping: Goal: Level of anxiety will decrease Outcome: Progressing   Problem: Pain Management: Goal: General experience of comfort will improve Outcome: Progressing   Problem: Safety: Goal: Ability to remain free from injury will improve Outcome: Progressing   Problem: Skin Integrity: Goal: Risk for impaired skin integrity will decrease Outcome: Progressing

## 2023-03-23 NOTE — Progress Notes (Signed)
PROGRESS NOTE    Krystal Henderson  VHQ:[id number] DOB: 01/02/1933 DOA: 03/17/2023 PCP: Donato Schultz, DO   Brief Narrative:  89 year old with past medical history significant for anemia, angiodysplasia of the colon, anxiety, chronic lower back pain, diverticulosis, colon polyps, degenerative joint disease, GERD vertebral fracture, hypertension, reactive hypoglycemia, pelvic fracture, peripheral neuropathy, vitamin B-12 deficiency who was brought to the ED with altered mental status that is started the morning of admission.  She was alert and oriented only to person.   Patient admitted with acute metabolic encephalopathy, cellulitis of the right lower extremity. New reduce systolic HF.   Assessment & Plan:   Principal Problem:   Acute metabolic encephalopathy Active Problems:   HYPERLIPIDEMIA   HTN (hypertension)   GERD (gastroesophageal reflux disease)   Vitamin B12 deficiency   Peripheral neuropathy   Lumbar compression fracture (HCC)   PAF (paroxysmal atrial fibrillation) (HCC)   CKD (chronic kidney disease) stage 4, GFR 15-29 ml/min (HCC)   Heart failure with recovered ejection fraction (HFrecEF) (HCC)   Cellulitis of lower extremity   Atelectasis, bilateral   Sepsis with acute organ dysfunction without septic shock (HCC)   Pressure injury of skin   Palliative care encounter   Medication management   Counseling and coordination of care   Acute combined systolic and diastolic heart failure (HCC)  1-Acute metabolic encephalopathy: In the setting of lower extremity cellulitis, delirium, pain/B12 deficiency. -CT head no acute intracranial abnormality B 12 was low last week, she is on supplement. -TSH: 4.3, ,Cortisol level, ammonia: 11 , RPR: non reactive.  She is fully alert and oriented during my evaluation again today.  Dysphagia: Patient was noted to have dysphagia by the nurse and palliative care today.  Seen by SLP, diet was continued however today she had more  difficulty swallowing, reassessed by SLP, she is deemed to be high risk for aspiration, SLP recommended switching to full n.p.o. with no sips for meds.   Acute Systolic Heart failure:  ECHO 41/32: EF 35 % global hypokinesis.  Cardiology consulted.  They have started her on IV Lasix, they offered cardiac cath but due to her significant deconditioning, they recommended palliative have firm goal of care with her before doing any intervention, in the meantime, they were focusing on GDMT.  However Lasix was switched to oral 03/23/2023.   Lower extremity cellulitis Almost negligible erythema right lower extremity with mild warmth.  Patient completed course of Rocephin.   Lumbar compression fracture -CT Lumbar 12/06: Acute compression fracture of the T12 vertebral body with approximately 50% height loss both anterior and posteriorly. Chronic, although significantly increased high-grade wedge deformity of L1 when compared to recent radiographs dated 02/04/2023.  Now she has lidocaine patch as well. On calcitonin spray.  Will need follow-up with neurosurgery for further management.  Per husband, patient has seen Dr. Danielle Dess in the past and he is making phone calls to make an appointment.   Hypertension: Controlled, not on any medications other than IV Lasix.   GERD: -Pepcid.  Peripheral neuropathy:  Continue with pregabalin   CKD stage IV: Creatinine baseline: 1.6--1.9.  Currently at baseline.   Paroxysmal A-fib: -Continue with Eliquis.    Hyperlipidemia: Not on meds.  Follow up outpatient.   Goal of care: Patient had expressed her desire to be more comfortable and die peacefully this morning with cardiology and with myself.  Patient appears to be significantly deconditioned and failed swallowing completely and needed to be n.p.o.  Discussed with palliative  care who had discussion with the family/husband and with mutual agreement, patient was switched to comfort care focused only by palliative  care.  Plan to discharge home with hospice, likely in 1 to 2 days when everything is arranged.  TOC is working with the family.  DVT prophylaxis: apixaban (ELIQUIS) tablet 2.5 mg Start: 03/17/23 2200   Code Status: Do not attempt resuscitation (DNR) - Comfort care  Family Communication: Husband present at bedside.  Plan of care discussed with patient in length and he/she verbalized understanding and agreed with it.  Status is: Inpatient Remains inpatient appropriate because: Needs diuresis.  May be ready for discharge in next 1 to 2 days.   Estimated body mass index is 22.05 kg/m as calculated from the following:   Height as of this encounter: 5\' 5"  (1.651 m).   Weight as of this encounter: 60.1 kg.  Pressure Injury 03/17/23 Buttocks Right Stage 2 -  Partial thickness loss of dermis presenting as a shallow open injury with a red, pink wound bed without slough. (Active)  03/17/23 2000  Location: Buttocks  Location Orientation: Right  Staging: Stage 2 -  Partial thickness loss of dermis presenting as a shallow open injury with a red, pink wound bed without slough.  Wound Description (Comments):   Present on Admission: Yes  Dressing Type Foam - Lift dressing to assess site every shift 03/23/23 0932     Pressure Injury 03/17/23 Sacrum Mid Stage 2 -  Partial thickness loss of dermis presenting as a shallow open injury with a red, pink wound bed without slough. (Active)  03/17/23 2000  Location: Sacrum  Location Orientation: Mid  Staging: Stage 2 -  Partial thickness loss of dermis presenting as a shallow open injury with a red, pink wound bed without slough.  Wound Description (Comments):   Present on Admission: Yes  Dressing Type Foam - Lift dressing to assess site every shift 03/23/23 0932     Pressure Injury 03/17/23 Vertebral column Lower;Mid Stage 1 -  Intact skin with non-blanchable redness of a localized area usually over a bony prominence. red ublanchable wound to spine X2 (2x1  and 1x1) (Active)  03/17/23 2000  Location: Vertebral column  Location Orientation: Lower;Mid  Staging: Stage 1 -  Intact skin with non-blanchable redness of a localized area usually over a bony prominence.  Wound Description (Comments): red ublanchable wound to spine X2 (2x1 and 1x1)  Present on Admission: Yes  Dressing Type Foam - Lift dressing to assess site every shift 03/23/23 0932   Nutritional Assessment: Body mass index is 22.05 kg/m.[name] Seen by dietician.  I agree with the assessment and plan as outlined below: Nutrition Status:        . Skin Assessment: I have examined the patient's skin and I agree with the wound assessment as performed by the wound care RN as outlined below: Pressure Injury 03/17/23 Buttocks Right Stage 2 -  Partial thickness loss of dermis presenting as a shallow open injury with a red, pink wound bed without slough. (Active)  03/17/23 2000  Location: Buttocks  Location Orientation: Right  Staging: Stage 2 -  Partial thickness loss of dermis presenting as a shallow open injury with a red, pink wound bed without slough.  Wound Description (Comments):   Present on Admission: Yes  Dressing Type Foam - Lift dressing to assess site every shift 03/23/23 0932     Pressure Injury 03/17/23 Sacrum Mid Stage 2 -  Partial thickness loss of dermis presenting as  a shallow open injury with a red, pink wound bed without slough. (Active)  03/17/23 2000  Location: Sacrum  Location Orientation: Mid  Staging: Stage 2 -  Partial thickness loss of dermis presenting as a shallow open injury with a red, pink wound bed without slough.  Wound Description (Comments):   Present on Admission: Yes  Dressing Type Foam - Lift dressing to assess site every shift 03/23/23 0932     Pressure Injury 03/17/23 Vertebral column Lower;Mid Stage 1 -  Intact skin with non-blanchable redness of a localized area usually over a bony prominence. red ublanchable wound to spine X2 (2x1 and 1x1)  (Active)  03/17/23 2000  Location: Vertebral column  Location Orientation: Lower;Mid  Staging: Stage 1 -  Intact skin with non-blanchable redness of a localized area usually over a bony prominence.  Wound Description (Comments): red ublanchable wound to spine X2 (2x1 and 1x1)  Present on Admission: Yes  Dressing Type Foam - Lift dressing to assess site every shift 03/23/23 0932    Consultants:  Cardiology  Procedures:  As above  Antimicrobials:  Anti-infectives (From admission, onward)    Start     Dose/Rate Route Frequency Ordered Stop   03/17/23 1230  cefTRIAXone (ROCEPHIN) 2 g in sodium chloride 0.9 % 100 mL IVPB        2 g 200 mL/hr over 30 Minutes Intravenous Every 24 hours 03/17/23 1223 03/23/23 1323         Subjective: Patient seen and examined.  Was complaining of back pain.  She said " I want to die"  Objective: Vitals:   03/22/23 2004 03/22/23 2320 03/23/23 0422 03/23/23 0915  BP: 123/61 134/64 125/67 125/67  Pulse: 90 (!) 105 87 (!) 106  Resp: 18  16   Temp: 98 F (36.7 C) 98.9 F (37.2 C) 98.5 F (36.9 C)   TempSrc: Oral Oral Oral   SpO2: 99% 100% 100%   Weight:      Height:        Intake/Output Summary (Last 24 hours) at 03/23/2023 1328 Last data filed at 03/23/2023 1323 Gross per 24 hour  Intake 100 ml  Output --  Net 100 ml   Filed Weights   03/20/23 0453 03/21/23 0541 03/22/23 0500  Weight: 61.9 kg 61 kg 60.1 kg    Examination:  General exam: Appears calm and comfortable appears chronically sick and deconditioned Respiratory system: Clear to auscultation. Respiratory effort normal. Cardiovascular system: S1 & S2 heard, RRR. No JVD, murmurs, rubs, gallops or clicks. No pedal edema. Gastrointestinal system: Abdomen is nondistended, soft and nontender. No organomegaly or masses felt. Normal bowel sounds heard. Central nervous system: Alert and oriented. No focal neurological deficits. Extremities: Symmetric 5 x 5 power. Skin: Very  minimal erythema right lower extremity.   Data Reviewed: I have personally reviewed following labs and imaging studies  CBC: Recent Labs  Lab 03/17/23 1220 03/18/23 0507 03/21/23 1132 03/22/23 0522 03/23/23 0518  WBC 6.6 6.1 7.4 8.1 7.4  NEUTROABS 4.3  --   --   --   --   HGB 13.2 11.8* 12.1 12.0 12.2  HCT 40.5 37.3 37.1 36.8 37.7  MCV 94.4 95.9 94.2 93.4 93.5  PLT 324 285 327 342 336   Basic Metabolic Panel: Recent Labs  Lab 03/19/23 0518 03/20/23 0519 03/21/23 1132 03/22/23 0522 03/23/23 0518  NA 134* 135 136 139 137  K 3.9 4.0 4.1 3.8 3.8  CL 99 102 103 101 98  CO2 25 25  26 29 29   GLUCOSE 97 100* 114* 98 94  BUN 30* 26* 27* 32* 33*  CREATININE 1.78* 1.56* 1.40* 1.64* 1.71*  CALCIUM 8.7* 8.7* 8.9 9.2 9.2   GFR: Estimated Creatinine Clearance: 20.5 mL/min (A) (by C-G formula based on SCr of 1.71 mg/dL (H)). Liver Function Tests: Recent Labs  Lab 03/17/23 1220 03/18/23 0507  AST 16 14*  ALT 20 17  ALKPHOS 81 69  BILITOT 1.0 0.7  PROT 7.5 6.3*  ALBUMIN 3.7 3.0*   No results for input(s): "LIPASE", "AMYLASE" in the last 168 hours. Recent Labs  Lab 03/18/23 1050  AMMONIA 11   Coagulation Profile: Recent Labs  Lab 03/17/23 1220  INR 2.0*   Cardiac Enzymes: No results for input(s): "CKTOTAL", "CKMB", "CKMBINDEX", "TROPONINI" in the last 168 hours. BNP (last 3 results) No results for input(s): "PROBNP" in the last 8760 hours. HbA1C: No results for input(s): "HGBA1C" in the last 72 hours. CBG: Recent Labs  Lab 03/22/23 2005 03/22/23 2342 03/23/23 0342 03/23/23 0804 03/23/23 1124  GLUCAP 123* 130* 118* 96 101*   Lipid Profile: No results for input(s): "CHOL", "HDL", "LDLCALC", "TRIG", "CHOLHDL", "LDLDIRECT" in the last 72 hours. Thyroid Function Tests: No results for input(s): "TSH", "T4TOTAL", "FREET4", "T3FREE", "THYROIDAB" in the last 72 hours. Anemia Panel: No results for input(s): "VITAMINB12", "FOLATE", "FERRITIN", "TIBC", "IRON",  "RETICCTPCT" in the last 72 hours. Sepsis Labs: Recent Labs  Lab 03/17/23 1437 03/21/23 1132  PROCALCITON  --  <0.10  LATICACIDVEN 1.1  --     Recent Results (from the past 240 hours)  Blood Culture (routine x 2)     Status: None   Collection Time: 03/17/23 12:15 PM   Specimen: BLOOD  Result Value Ref Range Status   Specimen Description   Final    BLOOD RIGHT ANTECUBITAL Performed at [facility], [address] [place], [place] [address]    Special Requests   Final    BOTTLES DRAWN AEROBIC AND ANAEROBIC Blood Culture results may not be optimal due to an inadequate volume of blood received in culture bottles Performed at [facility], [address] [place], [place] [address]    Culture   Final    NO GROWTH 5 DAYS Performed at [facility], [address] [place], [place] [address]    Report Status 03/22/2023 FINAL  Final  Blood Culture (routine x 2)     Status: None   Collection Time: 03/17/23 12:20 PM   Specimen: BLOOD  Result Value Ref Range Status   Specimen Description   Final    BLOOD BLOOD LEFT ARM Performed at [facility], [address] [place], [place] [address]    Special Requests   Final    BOTTLES DRAWN AEROBIC AND ANAEROBIC Blood Culture results may not be optimal due to an inadequate volume of blood received in culture bottles Performed at [facility], [address], [place], [place] [address]    Culture   Final    NO GROWTH 5 DAYS Performed at [facility], [address] [place], [place] [address]    Report Status 03/22/2023 FINAL  Final  Resp panel by RT-PCR (RSV, Flu A&B, Covid) Anterior Nasal Swab     Status: None   Collection Time: 03/17/23 12:22 PM   Specimen: Anterior Nasal Swab  Result Value Ref Range Status   SARS Coronavirus 2 by RT PCR NEGATIVE NEGATIVE Final    Comment: (NOTE)  SARS-CoV-2 target nucleic acids are NOT DETECTED.  The SARS-CoV-2  RNA is generally detectable in upper respiratory specimens during the acute phase of infection. The lowest concentration of SARS-CoV-2 viral copies this assay can detect is 138 copies/mL. A negative result does not preclude SARS-Cov-2 infection and should not be used as the sole basis for treatment or other patient management decisions. A negative result may occur with  improper specimen collection/handling, submission of specimen other than nasopharyngeal swab, presence of viral mutation(s) within the areas targeted by this assay, and inadequate number of viral copies(<138 copies/mL). A negative result must be combined with clinical observations, patient history, and epidemiological information. The expected result is Negative.  Fact Sheet for Patients:  BloggerCourse.com  Fact Sheet for Healthcare Providers:  SeriousBroker.it  This test is no t yet approved or cleared by the [place] FDA and  has been authorized for detection and/or diagnosis of SARS-CoV-2 by FDA under an Emergency Use Authorization (EUA). This EUA will remain  in effect (meaning this test can be used) for the duration of the COVID-19 declaration under Section 564(b)(1) of the Act, 21 U.S.C.section 360bbb-3(b)(1), unless the authorization is terminated  or revoked sooner.       Influenza A by PCR NEGATIVE NEGATIVE Final   Influenza B by PCR NEGATIVE NEGATIVE Final    Comment: (NOTE) The Xpert Xpress SARS-CoV-2/FLU/RSV plus assay is intended as an aid in the diagnosis of influenza from Nasopharyngeal swab specimens and should not be used as a sole basis for treatment. Nasal washings and aspirates are unacceptable for Xpert Xpress SARS-CoV-2/FLU/RSV testing.  Fact Sheet for Patients: BloggerCourse.com  Fact Sheet for Healthcare Providers: SeriousBroker.it  This test is not yet approved or cleared by the  [place] FDA and has been authorized for detection and/or diagnosis of SARS-CoV-2 by FDA under an Emergency Use Authorization (EUA). This EUA will remain in effect (meaning this test can be used) for the duration of the COVID-19 declaration under Section 564(b)(1) of the Act, 21 U.S.C. section 360bbb-3(b)(1), unless the authorization is terminated or revoked.     Resp Syncytial Virus by PCR NEGATIVE NEGATIVE Final    Comment: (NOTE) Fact Sheet for Patients: BloggerCourse.com  Fact Sheet for Healthcare Providers: SeriousBroker.it  This test is not yet approved or cleared by the [place] FDA and has been authorized for detection and/or diagnosis of SARS-CoV-2 by FDA under an Emergency Use Authorization (EUA). This EUA will remain in effect (meaning this test can be used) for the duration of the COVID-19 declaration under Section 564(b)(1) of the Act, 21 U.S.C. section 360bbb-3(b)(1), unless the authorization is terminated or revoked.  Performed at [facility], [address], [place], [place] [address]      Radiology Studies: No results found.  Scheduled Meds:  apixaban  2.5 mg Oral BID   calcitonin (salmon)  1 spray Alternating Nares Daily   famotidine  10 mg Oral Daily   feeding supplement  1 Container Oral TID BM   furosemide  40 mg Oral Daily   metoprolol succinate  50 mg Oral Daily   pregabalin  50 mg Oral QHS   senna-docusate  1 tablet Oral BID   Continuous Infusions:     LOS: 6 days   Hughie Closs, MD [facility]  03/23/2023, 1:28 PM   *Please note that this is a verbal dictation therefore any spelling or grammatical errors are due to the "Dragon Medical One" system interpretation.  Please page via Amion and  do not message via secure chat for urgent patient care matters. Secure chat can be used for non urgent patient care matters.  How to contact the [facility] Attending or  Consulting provider 7A - 7P or covering provider during after hours 7P -7A, for this patient?  Check the care team in [facility] and look for a) attending/consulting TRH provider listed and b) the [facility] team listed. Page or secure chat 7A-7P. Log into [email or web address] and use [place]'s universal password to access. If you do not have the password, please contact the hospital operator. Locate the [facility] provider you are looking for under [facility] and page to a number that you can be directly reached. If you still have difficulty reaching the provider, please page the [facility] (Director on Call) for the Hospitalists listed on amion for assistance.

## 2023-03-23 NOTE — Progress Notes (Addendum)
Patient Name: Krystal Henderson Date of Encounter: 03/23/2023 [facility] Cardiologist: Will Jorja Loa, MD   Interval Summary  .    Barely able to articulate and only speaks short one-word phrases.  She has her mouth gaping open.  She does respond appropriately to head nods.  She has muttered that she wants to die on 2 different occasions now including today.  Vital Signs .    Vitals:   03/22/23 1223 03/22/23 2004 03/22/23 2320 03/23/23 0422  BP: (!) 142/73 123/61 134/64 125/67  Pulse: 92 90 (!) 105 87  Resp: 16 18  16   Temp: 98.4 F (36.9 C) 98 F (36.7 C) 98.9 F (37.2 C) 98.5 F (36.9 C)  TempSrc: Oral Oral Oral Oral  SpO2: 99% 99% 100% 100%  Weight:      Height:        Intake/Output Summary (Last 24 hours) at 03/23/2023 0855 Last data filed at 03/22/2023 2536 Gross per 24 hour  Intake --  Output 700 ml  Net -700 ml      03/22/2023    5:00 AM 03/21/2023    5:41 AM 03/20/2023    4:53 AM  Last 3 Weights  Weight (lbs) 132 lb 7.9 oz 134 lb 7.7 oz 136 lb 7.4 oz  Weight (kg) 60.1 kg 61 kg 61.9 kg      Telemetry/ECG    Atrial fibrillation heart rates between 90-100.  At times a little bit tachycardic in the 120s today.- Personally Reviewed  CV Studies    Echocardiogram 03/19/2023 1. Left ventricular ejection fraction, by estimation, is 30 to 35%. The  left ventricle has moderately decreased function. The left ventricle  demonstrates global hypokinesis. Left ventricular diastolic function could  not be evaluated. Elevated left  atrial pressure. The E/e' is 21.   2. Right ventricular systolic function is low normal. The right  ventricular size is mildly enlarged. There is severely elevated pulmonary  artery systolic pressure. The estimated right ventricular systolic  pressure is 68.3 mmHg.   3. Left atrial size was severely dilated.   4. Right atrial size was moderately dilated.   5. The mitral valve is normal in structure. Severe mitral valve   regurgitation. No evidence of mitral stenosis.   6. Tricuspid valve regurgitation is moderate.   7. The aortic valve is normal in structure. Aortic valve regurgitation is  moderate. No aortic stenosis is present.   8. There is dilatation of the ascending aorta, measuring 40 mm.   9. The inferior vena cava is dilated in size with <50% respiratory  variability, suggesting right atrial pressure of 15 mmHg.   Comparison(s): A prior study was performed on 07/2021. LVEF has reduced  from 50-55% to 30-35%, MR and TR have progressed, and estimated RAP now  increased to .   Physical Exam .   GEN: Deconditioned, slightly confused Neck: no JVD Cardiac: Irregularly irregular Respiratory: crackles on the L side GI: Soft, nontender, non-distended  MS: No edema  Patient Profile    AMIR WITZ is a 89 y.o. female has hx of  chronic HFpEF, PAF, PE 2021, CKD, deconditioning, HTN, angiodysplasia of the colon, chronic lower back pain, degenerative joint disease, GERD vertebral fracture  anemia, peripheral neuropathy  and admitted on 03/17/2023 for the evaluation of altered mental status, back pain secondary to compression fracture, weakness and cellulitis.  Cardiology seen for newly reduced EF.  Assessment & Plan .     New onset acute HFrEF 30-35%  Echo this admission shows EF 30 to 35% with global hypokinesis.  Low normal RV function.  Severely elevated RVSP 68.3.  Severe MR, moderate TR, right atrial pressure 15.  The LVEF, RVSP, MR is significantly worse from prior reading a year ago.    IV Lasix stopped yesterday.  She still has pulmonary congestion noted on physical exam, likely needs continued p.o. diuresis.  Will put her on p.o. Lasix 40 mg daily for right now.  Please see below conversation about goals of care. Discussed with husband about further consideration of ischemic evaluation, he would be inclined. However, she has advanced age and seems to be deconditioned and declining.   Recommend palliative care to continue to assess goals of care. On Toprol-XL 50 mg May consider SGLT2 inhibitor, mentation may contribute this if not able to express symptoms.  Further GDMT limited by renal function.  Addendum: Per SLP she is to remain complete n.p.o.  Hold Lasix, resume if improves.   PAF Rates are reasonably controlled 90-110 generally, would not be too aggressive given age and frailty with avoidance of hypotension.  Heart rate generally less than 110 is acceptable, intermittently spikes 120 however otherwise well-controlled.  Blood pressure also reasonable for age 38s to 71s Continue reduced dose of Eliquis 2.5 mg for age and CKD.  Toprol-XL increased to 50 mg daily yesterday.   Acute metabolic encephalopathy Cellulitis and compression fracture Likely in the setting of cellulitis, pain secondary to compression fracture.  Management per primary team   CKD Creatinine on admission 2.21.  Slight increase from creatinine yesterday 1.4-1.64-1.71 today.  Goals of care Although altered mental status I do think she is able to communicate in some capacity.  She responds appropriately to simple questions with head nods.  She has voiced on at least 2 different occasions with me including today that she does not want to live and would rather die.  She is asked me to talk with husband for her, recommend palliative care to address along with family.  For questions or updates, please contact [facility] Please consult [email or web address] for contact info under        Signed, Abagail Kitchens, PA-C

## 2023-03-23 NOTE — Progress Notes (Signed)
Daily Progress Note   Patient Name: Krystal Henderson       Date: 03/23/2023 DOB: [birth year]  Age: 89 y.o. MRN#: [id number] Attending Physician: Hughie Closs, MD Primary Care Physician: Zola Button, Grayling Congress, DO Admit Date: 03/17/2023 Length of Stay: 6 days  Reason for Consultation/Follow-up: Establishing goals of care and Pain control  Subjective:   CC: Patient noted she "wants to die". Following up regarding complex medical decision making.  Subjective:  Reviewed EMR prior to presenting to bedside.  Discussed care with hospitalist and cardiology as well for medical updates.  Presented to bedside to speak with patient.  Patient laying in bed asleep though easily awakened.  Patient's husband, Peyton Najjar, present at bedside.  Able to introduce myself palliative medicine team in patient's medical journey.  Spent time discussing with Peyton Najjar recent medical updates including patient's SLP evaluation.  Discussed concern about aspiration.  Inquired about possible pathways for medical care moving forward so discussed this.  Discussed that there is no other medical "fix" to dysphagia.  Discussed tube feed would not prevent aspiration and Peyton Najjar agreed that patient would never want tube feeds.  Discussed and allowing comfort focused feeding with acknowledging patient's risk of aspiration.  Patient and husband agreeing with this plan.  Discussed with focusing on comfort, would focus on patient's symptom management at the end of life.  Introduced the idea of hospice and the support they would and would not provide.  Inquired if patient had considered where she would be on her end-of-life care to me and she noted that she did not have a preference as to where she was.  Peyton Najjar noted preference would be to get patient home with hospice.  Noted would reach out to [facility] to assist with choosing hospice agency.  Spent time discussing initiating comfort focused measures moving forward at this time.  Discussed that medical  will is never to "hasten".  Discussed can focus on patient's pain management.  Patient and husband acknowledged that patient would rather be sleepy and not in pain versus awake and uncomfortable as she is currently.  Patient currently requesting pain medication.  Noted would provide and can continue to adjust based on her symptom burden to support comfort.  All questions answered at that time.  Thanked patient husband for allow me to visit with him today.  Updated IDT regarding transition to comfort focused care and plan for patient to go home with hospice support.  Objective:   Vital Signs:  BP 125/67   Pulse (!) 106   Temp 98.5 F (36.9 C) (Oral)   Resp 16   Ht 5\' 5"  (1.651 m)   Wt 60.1 kg   SpO2 100%   BMI 22.05 kg/m   Physical Exam: General: NAD, awake, laying in bed, chronically ill-appearing, frail Cardiovascular: RRR Respiratory: no increased work of breathing noted, not in respiratory distress Neuro: Awake, interactive  Imaging: I personally reviewed recent imaging.   Assessment & Plan:   Assessment: Patient is an 89 year old female with a past medical history of paroxysmal A-fib, new onset acute heart failure with reduced ejection fraction of 30-35%, CKD, and pain secondary to compression fracture who was admitted on 03/17/2023 for management of change in mental status.  Palliative medicine team consulted to assist with pain control.  Recommendations/Plan: # Complex medical decision making/goals of care:  - Patient has continued to have worsening medical status despite aggressive medical interventions.  Patient now noted to have dysphagia.  Discussed possible pathways for medical care moving  forward with patient and husband.  Patient and husband have opted to transition to comfort focused care at this time.  Patient would not want to be on tube feeds which is appropriate given that it will not stop her aspiration risk.  Will allow food for comfort.  Will allow medications  for pain and symptom management knowing that these medications can make patient more sleepy.  Patient's husband, Peyton Najjar, would like to get patient home with hospice.  TOC has been consulted to assist.  -At this time we will discontinue interventions that are no longer focused on comfort such as IV fluids, imaging, or lab work.  Will instead focus on symptom management of pain, dyspnea, and agitation in the setting of end-of-life care.  -  Code Status: Limited: Do not attempt resuscitation (DNR) -DNR-LIMITED -Do Not Intubate/DNI   # Symptom management:  -Back pain, acute on chronic in setting of compression fracture of the T12 vertebrae   -Received Tylenol 1000 mg scheduled from 12/14-16. Could consider continuing if appropraite   -Continue lidocaine patch   -Start oral Dilaudid liquid 1 mg every 2 hours as needed. Continue to adjust based on patient's symptom burden.     -Anxiety/agitation, in the setting of end-of-life care                               -Start Haldol 0.5 mg every 4 hours as needed. Continue to adjust based on patient's symptom burden.                   -Secretions, in the setting of end-of-life care                               -Start glycopyrrolate every 4 hours as needed.  # Psychosocial Support:  -Husband  # Discharge Planning: Home with Hospice  Discussed with: patient, patient's husband, TOC, RN, hospitalist   Thank you for allowing the palliative care team to participate in the care Marlis Edelson.  Alvester Morin, DO Palliative Care Provider PMT # [phone]  If patient remains symptomatic despite maximum doses, please call PMT at [phone] between 0700 and 1900. Outside of these hours, please call attending, as PMT does not have night coverage.  *Please note that this is a verbal dictation therefore any spelling or grammatical errors are due to the "[employer]" system interpretation.

## 2023-03-24 ENCOUNTER — Ambulatory Visit: Payer: Medicare Other | Admitting: Family Medicine

## 2023-03-24 DIAGNOSIS — L03119 Cellulitis of unspecified part of limb: Secondary | ICD-10-CM | POA: Diagnosis not present

## 2023-03-24 DIAGNOSIS — G9341 Metabolic encephalopathy: Secondary | ICD-10-CM | POA: Diagnosis not present

## 2023-03-24 DIAGNOSIS — Z515 Encounter for palliative care: Secondary | ICD-10-CM | POA: Diagnosis not present

## 2023-03-24 DIAGNOSIS — L89312 Pressure ulcer of right buttock, stage 2: Secondary | ICD-10-CM | POA: Diagnosis not present

## 2023-03-24 DIAGNOSIS — R451 Restlessness and agitation: Secondary | ICD-10-CM

## 2023-03-24 DIAGNOSIS — I509 Heart failure, unspecified: Secondary | ICD-10-CM | POA: Diagnosis not present

## 2023-03-24 DIAGNOSIS — Z79899 Other long term (current) drug therapy: Secondary | ICD-10-CM

## 2023-03-24 LAB — GLUCOSE, CAPILLARY
Glucose-Capillary: 88 mg/dL (ref 70–99)
Glucose-Capillary: 89 mg/dL (ref 70–99)

## 2023-03-24 MED ORDER — HYDROMORPHONE HCL 1 MG/ML IJ SOLN
0.5000 mg | INTRAMUSCULAR | Status: DC | PRN
  Administered 2023-03-24 (×2): 0.5 mg via INTRAVENOUS
  Filled 2023-03-24 (×2): qty 0.5

## 2023-03-24 MED ORDER — HYDROMORPHONE HCL 1 MG/ML IJ SOLN: 1.0000 mg | INTRAMUSCULAR | Status: DC | PRN

## 2023-03-24 MED ORDER — LORAZEPAM 2 MG/ML IJ SOLN: 0.5000 mg | INTRAMUSCULAR | Status: DC | PRN

## 2023-03-24 MED ORDER — HYDROMORPHONE HCL 1 MG/ML IJ SOLN
1.0000 mg | INTRAMUSCULAR | Status: DC | PRN
  Administered 2023-03-24 (×2): 1 mg via INTRAVENOUS
  Filled 2023-03-24 (×2): qty 1

## 2023-03-24 NOTE — Plan of Care (Signed)
  Problem: Health Behavior/Discharge Planning: Goal: Ability to manage health-related needs will improve Outcome: Progressing   Problem: Clinical Measurements: Goal: Ability to maintain clinical measurements within normal limits will improve Outcome: Progressing Goal: Will remain free from infection Outcome: Progressing Goal: Diagnostic test results will improve Outcome: Progressing   

## 2023-03-24 NOTE — Progress Notes (Signed)
Discharge paper work is ready.[facility] called.Report given to [facility] place.

## 2023-03-24 NOTE — Progress Notes (Signed)
PROGRESS NOTE    Krystal Henderson  GEX:[id number] DOB: 07-08-1932 DOA: 03/17/2023 PCP: Donato Schultz, DO   Brief Narrative:  89 year old with past medical history significant for anemia, angiodysplasia of the colon, anxiety, chronic lower back pain, diverticulosis, colon polyps, degenerative joint disease, GERD vertebral fracture, hypertension, reactive hypoglycemia, pelvic fracture, peripheral neuropathy, vitamin B-12 deficiency who was brought to the ED with altered mental status that is started the morning of admission.  She was alert and oriented only to person.   Patient admitted with acute metabolic encephalopathy, cellulitis of the right lower extremity. New reduce systolic HF.   Assessment & Plan:   Principal Problem:   Acute metabolic encephalopathy Active Problems:   HYPERLIPIDEMIA   HTN (hypertension)   GERD (gastroesophageal reflux disease)   Vitamin B12 deficiency   Peripheral neuropathy   Lumbar compression fracture (HCC)   PAF (paroxysmal atrial fibrillation) (HCC)   CKD (chronic kidney disease) stage 4, GFR 15-29 ml/min (HCC)   Heart failure with recovered ejection fraction (HFrecEF) (HCC)   Cellulitis of lower extremity   Atelectasis, bilateral   Sepsis with acute organ dysfunction without septic shock (HCC)   Pressure injury of skin   Palliative care encounter   Medication management   Counseling and coordination of care   Acute combined systolic and diastolic heart failure (HCC)   Need for emotional support   Goals of care, counseling/discussion  1-Acute metabolic encephalopathy: In the setting of lower extremity cellulitis, delirium, pain/B12 deficiency. -CT head no acute intracranial abnormality B 12 was low last week, she is on supplement. -TSH: 4.3, ,Cortisol level, ammonia: 11 , RPR: non reactive.  Patient is having agonal breathing today and appears to be nearing death.  Dysphagia: Patient was noted to have dysphagia by the nurse and  palliative care today.  Seen by SLP, diet was continued however today she had more difficulty swallowing, reassessed by SLP, she is deemed to be high risk for aspiration, SLP recommended switching to full n.p.o. with no sips for meds.   Acute Systolic Heart failure:  ECHO 01/02: EF 35 % global hypokinesis.  Cardiology consulted.  They have started her on IV Lasix, they offered cardiac cath but due to her significant deconditioning, they recommended palliative have firm goal of care with her before doing any intervention, in the meantime, they were focusing on GDMT.  However Lasix was switched to oral 03/23/2023.   Lower extremity cellulitis Almost negligible erythema right lower extremity with mild warmth.  Patient completed course of Rocephin.   Lumbar compression fracture -CT Lumbar 12/06: Acute compression fracture of the T12 vertebral body with approximately 50% height loss both anterior and posteriorly. Chronic, although significantly increased high-grade wedge deformity of L1 when compared to recent radiographs dated 02/04/2023.  Now she has lidocaine patch as well. On calcitonin spray.  Will need follow-up with neurosurgery for further management.  Per husband, patient has seen Dr. Danielle Dess in the past and he is making phone calls to make an appointment.   Hypertension: Controlled, not on any medications other than IV Lasix.   GERD: -Pepcid.  Peripheral neuropathy:  Continue with pregabalin   CKD stage IV: Creatinine baseline: 1.6--1.9.  Currently at baseline.   Paroxysmal A-fib: -Continue with Eliquis.    Hyperlipidemia: Not on meds.  Follow up outpatient.   Goal of care: Patient had expressed her desire to be more comfortable and die peacefully on the morning of 03/23/2023 with cardiology and with myself.  Patient appears to  be significantly deconditioned and failed swallowing completely and needed to be n.p.o.  Discussed with palliative care who had discussion with the  family/husband and with mutual agreement, patient was switched to comfort care by palliative care.  Plan was to discharge home with hospice.  However this morning, patient is having agonal breathing, appears to be declining faster than expected and nearing death.  Expected date in the hospital.  Palliative care has now requested to evaluate for beacon place.  DVT prophylaxis:    Code Status: Do not attempt resuscitation (DNR) - Comfort care  Family Communication: None at bedside.  Palliative care discussing with family.  Status is: Inpatient Remains inpatient appropriate because: Nearing death.  Being evaluated for beacon place.  Estimated body mass index is 22.05 kg/m as calculated from the following:   Height as of this encounter: 5\' 5"  (1.651 m).   Weight as of this encounter: 60.1 kg.  Pressure Injury 03/17/23 Buttocks Right Stage 2 -  Partial thickness loss of dermis presenting as a shallow open injury with a red, pink wound bed without slough. (Active)  03/17/23 2000  Location: Buttocks  Location Orientation: Right  Staging: Stage 2 -  Partial thickness loss of dermis presenting as a shallow open injury with a red, pink wound bed without slough.  Wound Description (Comments):   Present on Admission: Yes  Dressing Type Foam - Lift dressing to assess site every shift 03/24/23 0842     Pressure Injury 03/17/23 Sacrum Mid Stage 2 -  Partial thickness loss of dermis presenting as a shallow open injury with a red, pink wound bed without slough. (Active)  03/17/23 2000  Location: Sacrum  Location Orientation: Mid  Staging: Stage 2 -  Partial thickness loss of dermis presenting as a shallow open injury with a red, pink wound bed without slough.  Wound Description (Comments):   Present on Admission: Yes  Dressing Type Foam - Lift dressing to assess site every shift 03/24/23 0842     Pressure Injury 03/17/23 Vertebral column Lower;Mid Stage 1 -  Intact skin with non-blanchable redness of  a localized area usually over a bony prominence. red ublanchable wound to spine X2 (2x1 and 1x1) (Active)  03/17/23 2000  Location: Vertebral column  Location Orientation: Lower;Mid  Staging: Stage 1 -  Intact skin with non-blanchable redness of a localized area usually over a bony prominence.  Wound Description (Comments): red ublanchable wound to spine X2 (2x1 and 1x1)  Present on Admission: Yes  Dressing Type Foam - Lift dressing to assess site every shift 03/24/23 0842   Nutritional Assessment: Body mass index is 22.05 kg/m.[name] Seen by dietician.  I agree with the assessment and plan as outlined below: Nutrition Status:        . Skin Assessment: I have examined the patient's skin and I agree with the wound assessment as performed by the wound care RN as outlined below: Pressure Injury 03/17/23 Buttocks Right Stage 2 -  Partial thickness loss of dermis presenting as a shallow open injury with a red, pink wound bed without slough. (Active)  03/17/23 2000  Location: Buttocks  Location Orientation: Right  Staging: Stage 2 -  Partial thickness loss of dermis presenting as a shallow open injury with a red, pink wound bed without slough.  Wound Description (Comments):   Present on Admission: Yes  Dressing Type Foam - Lift dressing to assess site every shift 03/24/23 0842     Pressure Injury 03/17/23 Sacrum Mid Stage 2 -  Partial thickness loss of dermis presenting as a shallow open injury with a red, pink wound bed without slough. (Active)  03/17/23 2000  Location: Sacrum  Location Orientation: Mid  Staging: Stage 2 -  Partial thickness loss of dermis presenting as a shallow open injury with a red, pink wound bed without slough.  Wound Description (Comments):   Present on Admission: Yes  Dressing Type Foam - Lift dressing to assess site every shift 03/24/23 0842     Pressure Injury 03/17/23 Vertebral column Lower;Mid Stage 1 -  Intact skin with non-blanchable redness of a localized  area usually over a bony prominence. red ublanchable wound to spine X2 (2x1 and 1x1) (Active)  03/17/23 2000  Location: Vertebral column  Location Orientation: Lower;Mid  Staging: Stage 1 -  Intact skin with non-blanchable redness of a localized area usually over a bony prominence.  Wound Description (Comments): red ublanchable wound to spine X2 (2x1 and 1x1)  Present on Admission: Yes  Dressing Type Foam - Lift dressing to assess site every shift 03/24/23 0842    Consultants:  Cardiology  Procedures:  As above  Antimicrobials:  Anti-infectives (From admission, onward)    Start     Dose/Rate Route Frequency Ordered Stop   03/17/23 1230  cefTRIAXone (ROCEPHIN) 2 g in sodium chloride 0.9 % 100 mL IVPB        2 g 200 mL/hr over 30 Minutes Intravenous Every 24 hours 03/17/23 1223 03/23/23 1323         Subjective: Patient seen and examined.  She was having agonal breathing this morning.  Objective: Vitals:   03/23/23 0915 03/23/23 1349 03/23/23 1956 03/24/23 0355  BP: 125/67 139/85 (!) 148/78 (!) 120/54  Pulse: (!) 106 93 97 80  Resp:  16 18 16   Temp:  98.6 F (37 C) 97.9 F (36.6 C) 98.6 F (37 C)  TempSrc:  Axillary Oral Oral  SpO2:  100% 100% 92%  Weight:      Height:        Intake/Output Summary (Last 24 hours) at 03/24/2023 1118 Last data filed at 03/24/2023 0918 Gross per 24 hour  Intake 160 ml  Output 925 ml  Net -765 ml   Filed Weights   03/20/23 0453 03/21/23 0541 03/22/23 0500  Weight: 61.9 kg 61 kg 60.1 kg    Examination:  General exam: Appears to be having agonal breathing.  Mouth open. Respiratory system: Clear to auscultation. Respiratory effort normal. Cardiovascular system: S1 & S2 heard, RRR. No JVD, murmurs, rubs, gallops or clicks. No pedal edema. Gastrointestinal system: Abdomen is nondistended, soft and nontender. No organomegaly or masses felt. Normal bowel sounds heard.   Data Reviewed: I have personally reviewed following labs and  imaging studies  CBC: Recent Labs  Lab 03/17/23 1220 03/18/23 0507 03/21/23 1132 03/22/23 0522 03/23/23 0518  WBC 6.6 6.1 7.4 8.1 7.4  NEUTROABS 4.3  --   --   --   --   HGB 13.2 11.8* 12.1 12.0 12.2  HCT 40.5 37.3 37.1 36.8 37.7  MCV 94.4 95.9 94.2 93.4 93.5  PLT 324 285 327 342 336   Basic Metabolic Panel: Recent Labs  Lab 03/19/23 0518 03/20/23 0519 03/21/23 1132 03/22/23 0522 03/23/23 0518  NA 134* 135 136 139 137  K 3.9 4.0 4.1 3.8 3.8  CL 99 102 103 101 98  CO2 25 25 26 29 29   GLUCOSE 97 100* 114* 98 94  BUN 30* 26* 27* 32* 33*  CREATININE 1.78*  1.56* 1.40* 1.64* 1.71*  CALCIUM 8.7* 8.7* 8.9 9.2 9.2   GFR: Estimated Creatinine Clearance: 20.5 mL/min (A) (by C-G formula based on SCr of 1.71 mg/dL (H)). Liver Function Tests: Recent Labs  Lab 03/17/23 1220 03/18/23 0507  AST 16 14*  ALT 20 17  ALKPHOS 81 69  BILITOT 1.0 0.7  PROT 7.5 6.3*  ALBUMIN 3.7 3.0*   No results for input(s): "LIPASE", "AMYLASE" in the last 168 hours. Recent Labs  Lab 03/18/23 1050  AMMONIA 11   Coagulation Profile: Recent Labs  Lab 03/17/23 1220  INR 2.0*   Cardiac Enzymes: No results for input(s): "CKTOTAL", "CKMB", "CKMBINDEX", "TROPONINI" in the last 168 hours. BNP (last 3 results) No results for input(s): "PROBNP" in the last 8760 hours. HbA1C: No results for input(s): "HGBA1C" in the last 72 hours. CBG: Recent Labs  Lab 03/23/23 1124 03/23/23 1557 03/23/23 1957 03/24/23 0001 03/24/23 0352  GLUCAP 101* 103* 101* 89 88   Lipid Profile: No results for input(s): "CHOL", "HDL", "LDLCALC", "TRIG", "CHOLHDL", "LDLDIRECT" in the last 72 hours. Thyroid Function Tests: No results for input(s): "TSH", "T4TOTAL", "FREET4", "T3FREE", "THYROIDAB" in the last 72 hours. Anemia Panel: No results for input(s): "VITAMINB12", "FOLATE", "FERRITIN", "TIBC", "IRON", "RETICCTPCT" in the last 72 hours. Sepsis Labs: Recent Labs  Lab 03/17/23 1437 03/21/23 1132  PROCALCITON   --  <0.10  LATICACIDVEN 1.1  --     Recent Results (from the past 240 hours)  Blood Culture (routine x 2)     Status: None   Collection Time: 03/17/23 12:15 PM   Specimen: BLOOD  Result Value Ref Range Status   Specimen Description   Final    BLOOD RIGHT ANTECUBITAL Performed at [facility], [address], [place], [place] [address]    Special Requests   Final    BOTTLES DRAWN AEROBIC AND ANAEROBIC Blood Culture results may not be optimal due to an inadequate volume of blood received in culture bottles Performed at [facility], [address], [place], [place] [address]    Culture   Final    NO GROWTH 5 DAYS Performed at [facility], [address] [place], [place] [address]    Report Status 03/22/2023 FINAL  Final  Blood Culture (routine x 2)     Status: None   Collection Time: 03/17/23 12:20 PM   Specimen: BLOOD  Result Value Ref Range Status   Specimen Description   Final    BLOOD BLOOD LEFT ARM Performed at [facility], [address] [place], [place] [address]    Special Requests   Final    BOTTLES DRAWN AEROBIC AND ANAEROBIC Blood Culture results may not be optimal due to an inadequate volume of blood received in culture bottles Performed at [facility], [address] [place], [place] [address]    Culture   Final    NO GROWTH 5 DAYS Performed at [facility], [address] [place], [place] [address]    Report Status 03/22/2023 FINAL  Final  Resp panel by RT-PCR (RSV, Flu A&B, Covid) Anterior Nasal Swab     Status: None   Collection Time: 03/17/23 12:22 PM   Specimen: Anterior Nasal Swab  Result Value Ref Range Status   SARS Coronavirus 2 by RT PCR NEGATIVE NEGATIVE Final    Comment: (NOTE) SARS-CoV-2 target nucleic acids are NOT DETECTED.  The SARS-CoV-2 RNA is generally detectable in upper respiratory specimens during the  acute phase of infection. The  lowest concentration of SARS-CoV-2 viral copies this assay can detect is 138 copies/mL. A negative result does not preclude SARS-Cov-2 infection and should not be used as the sole basis for treatment or other patient management decisions. A negative result may occur with  improper specimen collection/handling, submission of specimen other than nasopharyngeal swab, presence of viral mutation(s) within the areas targeted by this assay, and inadequate number of viral copies(<138 copies/mL). A negative result must be combined with clinical observations, patient history, and epidemiological information. The expected result is Negative.  Fact Sheet for Patients:  BloggerCourse.com  Fact Sheet for Healthcare Providers:  SeriousBroker.it  This test is no t yet approved or cleared by the [place] FDA and  has been authorized for detection and/or diagnosis of SARS-CoV-2 by FDA under an Emergency Use Authorization (EUA). This EUA will remain  in effect (meaning this test can be used) for the duration of the COVID-19 declaration under Section 564(b)(1) of the Act, 21 U.S.C.section 360bbb-3(b)(1), unless the authorization is terminated  or revoked sooner.       Influenza A by PCR NEGATIVE NEGATIVE Final   Influenza B by PCR NEGATIVE NEGATIVE Final    Comment: (NOTE) The Xpert Xpress SARS-CoV-2/FLU/RSV plus assay is intended as an aid in the diagnosis of influenza from Nasopharyngeal swab specimens and should not be used as a sole basis for treatment. Nasal washings and aspirates are unacceptable for Xpert Xpress SARS-CoV-2/FLU/RSV testing.  Fact Sheet for Patients: BloggerCourse.com  Fact Sheet for Healthcare Providers: SeriousBroker.it  This test is not yet approved or cleared by the [place] FDA and has been authorized for detection and/or diagnosis of SARS-CoV-2 by FDA under  an Emergency Use Authorization (EUA). This EUA will remain in effect (meaning this test can be used) for the duration of the COVID-19 declaration under Section 564(b)(1) of the Act, 21 U.S.C. section 360bbb-3(b)(1), unless the authorization is terminated or revoked.     Resp Syncytial Virus by PCR NEGATIVE NEGATIVE Final    Comment: (NOTE) Fact Sheet for Patients: BloggerCourse.com  Fact Sheet for Healthcare Providers: SeriousBroker.it  This test is not yet approved or cleared by the [place] FDA and has been authorized for detection and/or diagnosis of SARS-CoV-2 by FDA under an Emergency Use Authorization (EUA). This EUA will remain in effect (meaning this test can be used) for the duration of the COVID-19 declaration under Section 564(b)(1) of the Act, 21 U.S.C. section 360bbb-3(b)(1), unless the authorization is terminated or revoked.  Performed at [facility], [address], [place], [place] [address]      Radiology Studies: No results found.  Scheduled Meds:   Continuous Infusions:     LOS: 7 days   Hughie Closs, MD [facility]  03/24/2023, 11:18 AM   *Please note that this is a verbal dictation therefore any spelling or grammatical errors are due to the "Dragon Medical One" system interpretation.  Please page via Amion and do not message via secure chat for urgent patient care matters. Secure chat can be used for non urgent patient care matters.  How to contact the [facility] Attending or Consulting provider 7A - 7P or covering provider during after hours 7P -7A, for this patient?  Check the care team in [facility] and look for a) attending/consulting TRH provider listed and b) the [facility] team listed. Page or secure chat 7A-7P. Log into [email or web address] and use [place]'s universal password to access. If you do not have  the password, please contact the hospital operator. Locate the [facility] provider  you are looking for under [facility] and page to a number that you can be directly reached. If you still have difficulty reaching the provider, please page the [facility] (Director on Call) for the Hospitalists listed on amion for assistance.

## 2023-03-24 NOTE — Progress Notes (Signed)
   03/24/23 1200  Spiritual Encounters  Type of Visit Initial  Care provided to: Patient  Referral source Nurse (RN/NT/LPN)  Reason for visit End-of-life  OnCall Visit Yes   Chaplain visited patient with compassionate presence, empathy, prayer and song.  Labored breathing eased when Chaplain sang "Amazing Delorise Shiner".  Chaplain spiritual support services remain available as the need arises.

## 2023-03-24 NOTE — Plan of Care (Signed)
  Problem: Clinical Measurements: Goal: Ability to maintain clinical measurements within normal limits will improve Outcome: Adequate for Discharge Goal: Diagnostic test results will improve Outcome: Adequate for Discharge Goal: Respiratory complications will improve Outcome: Adequate for Discharge Goal: Cardiovascular complication will be avoided Outcome: Adequate for Discharge   Problem: Activity: Goal: Risk for activity intolerance will decrease Outcome: Adequate for Discharge   Problem: Coping: Goal: Level of anxiety will decrease Outcome: Adequate for Discharge

## 2023-03-24 NOTE — Progress Notes (Signed)
    Patient Name: Krystal Henderson           DOB: Mar 13, 1933  MRN: [id number]      Admission Date: 03/17/2023  Attending Provider: Hughie Closs, MD  Primary Diagnosis: Acute metabolic encephalopathy   Level of care: Progressive    CROSS COVER NOTE   Date of Service   03/24/2023   [name], 89 y.o. female, was admitted on 03/17/2023 for Acute metabolic encephalopathy.    HPI/Events of Note   Discomfort, pain reported by RN. Patient is comfort care.  Patient has order for oral dilaudid, but there are aspiration concerns. Order placed for IV dilaudid.    Interventions/ Plan   IV Dilaudid         Anthoney Harada, DNP, ACNPC- AG [facility] [place]

## 2023-03-24 NOTE — TOC Transition Note (Addendum)
Transition of Care [facility]) - Discharge Note   Patient Details  Name: Krystal Henderson MRN: [id number] Date of Birth: 1932/11/10  [facility]) CM/SW Contact:  Lanier Clam, RN Phone Number: 03/24/2023, 10:11 AM   Clinical Narrative:  [facility] accepted for home w/hospice services rep Melissa. PTAR for transportation confirmed.DNR will go in d/c packet.  -11a to be evaluated for [address] rep Melissa to eval-await outcome. -3:25p [facility] rep Melissa accepted for [employer] after 5p d/c-PTAR forms in printer for [facility], add signed DNR. PTAR tel#[phone](Nsg to call once ready). No further CM needs. -3:44p Once consents signed [facility] rep Efraim Kaufmann )will call nurse [facility] @ [phone] to call PTAR @[phone].    Final next level of care: Home w Hospice Care Barriers to Discharge: No Barriers Identified   Patient Goals and CMS Choice Patient states their goals for this hospitalization and ongoing recovery are:: Rehab CMS Medicare.gov Compare Post Acute Care list provided to:: Patient Represenative (must comment) Choice offered to / list presented to : Spouse [place] ownership interest in [facility].provided to:: Spouse    Discharge Placement                       Discharge Plan and Services Additional resources added to the After Visit Summary for     Discharge Planning Services: CM Consult Post Acute Care Choice: Hospice                    HH Arranged: RN [facility] Agency: [facility]        Social Drivers of Health (SDOH) Interventions SDOH Screenings   Food Insecurity: No Food Insecurity (03/17/2023)  Housing: Low Risk  (03/17/2023)  Transportation Needs: No Transportation Needs (03/17/2023)  Utilities: Not At Risk (03/17/2023)  Alcohol Screen: Low Risk  (02/22/2023)  Depression (PHQ2-9): Low Risk  (02/24/2023)  Financial Resource Strain: Low Risk  (02/22/2023)  Physical Activity:  Unknown (02/22/2023)  Social Connections: Moderately Isolated (02/22/2023)  Stress: No Stress Concern Present (02/22/2023)  Tobacco Use: Low Risk  (03/17/2023)     Readmission Risk Interventions     No data to display

## 2023-03-24 NOTE — Discharge Summary (Signed)
Physician Discharge Summary  Krystal Henderson ZOX:[id number] DOB: 01/11/1933 DOA: 03/17/2023  PCP: Donato Schultz, DO  Admit date: 03/17/2023 Discharge date: 03/24/2023 30 Day Unplanned Readmission Risk Score    Flowsheet Row ED to Hosp-Admission (Current) from 03/17/2023 in [facility] FLOOR [facility]GR[facility]IVE C[facility] UROLOGY  30 Day Unplanned Readmission Risk Score (%) 18.21 Filed at 03/24/2023 1200       This score is the patient's risk of an unplanned readmission within 30 days of being discharged (0 -100%). The score is based on dignosis, age, lab data, medications, orders, and past utilization.   Low:  0-14.9   Medium: 15-21.9   High: 22-29.9   Extreme: 30 and above          Admitted From: Home Disposition:  [facility]  Discharge Condition:guarded CODE STATUS:DNR Diet recommendation: comfort foods  Subjective:seen and examined earlier, pt with agonal breathing.   Brief/Interim Summary: 89 year old with past medical history significant for anemia, angiodysplasia of the colon, anxiety, chronic lower back pain, diverticulosis, colon polyps, degenerative joint disease, GERD vertebral fracture, hypertension, reactive hypoglycemia, pelvic fracture, peripheral neuropathy, vitamin B-12 deficiency who was brought to the ED with altered mental status that is started the morning of admission.  She was alert and oriented only to person.   Patient admitted with acute metabolic encephalopathy, cellulitis of the right lower extremity. New reduce systolic HF. Further details below.   1-Acute metabolic encephalopathy: In the setting of lower extremity cellulitis, delirium, pain/B12 deficiency. -CT head no acute intracranial abnormality B 12 was low last week, she is on supplement. -TSH: 4.3, ,Cortisol level, ammonia: 11 , RPR: non reactive.  Patient is having agonal breathing today and appears to be nearing death.   Dysphagia: Patient was noted to have dysphagia by the nurse and  palliative care today.  Seen by SLP, diet was continued however today she had more difficulty swallowing, reassessed by SLP, she is deemed to be high risk for aspiration, SLP recommended switching to full n.p.o. with no sips for meds.   Acute Systolic Heart failure:  ECHO 81/19: EF 35 % global hypokinesis.  Cardiology consulted.  They have started her on IV Lasix, they offered cardiac cath but due to her significant deconditioning, they recommended palliative have firm goal of care with her before doing any intervention, in the meantime, they were focusing on GDMT.  However Lasix was switched to oral 03/23/2023.   Lower extremity cellulitis Almost negligible erythema right lower extremity with mild warmth.  Patient completed course of Rocephin.   Lumbar compression fracture -CT Lumbar 12/06: Acute compression fracture of the T12 vertebral body with approximately 50% height loss both anterior and posteriorly. Chronic, although significantly increased high-grade wedge deformity of L1 when compared to recent radiographs dated 02/04/2023.  She was on lidocaine patch as well. On calcitonin spray.     Hypertension: Controlled, not on any medications    CKD stage IV: Creatinine baseline: 1.6--1.9.  Currently at baseline.   Paroxysmal A-fib: -Continue with Eliquis.    Hyperlipidemia: Not on meds.  Follow up outpatient.    Goal of care: Patient had expressed her desire to be more comfortable and die peacefully on the morning of 03/23/2023 with cardiology and with myself.  Patient appears to be significantly deconditioned and failed swallowing completely and needed to be n.p.o.  Discussed with palliative care who had discussion with the family/husband and with mutual agreement, patient was switched to comfort care by palliative care.  Plan was  to discharge home with hospice.  However this morning, patient is having agonal breathing, appears to be declining faster than expected and nearing death.  Palliative care recommended inpatient hospice and patient has received offer at [facility] and will be discharged today if and when family signs all paperwork/consents.  Discharge plan was discussed with patient and/or family member and they verbalized understanding and agreed with it.  Discharge Diagnoses:  Principal Problem:   Acute metabolic encephalopathy Active Problems:   HYPERLIPIDEMIA   HTN (hypertension)   GERD (gastroesophageal reflux disease)   Vitamin B12 deficiency   Peripheral neuropathy   Lumbar compression fracture (HCC)   PAF (paroxysmal atrial fibrillation) (HCC)   CKD (chronic kidney disease) stage 4, GFR 15-29 ml/min (HCC)   Heart failure with recovered ejection fraction (HFrecEF) (HCC)   Cellulitis of lower extremity   Atelectasis, bilateral   Sepsis with acute organ dysfunction without septic shock (HCC)   Pressure injury of skin   Palliative care encounter   Medication management   Counseling and coordination of care   Acute combined systolic and diastolic heart failure (HCC)   Need for emotional support   Goals of care, counseling/discussion   Agitation   High risk medication use    Discharge Instructions   Allergies as of 03/24/2023       Reactions   Codeine Nausea Only   Duloxetine Other (See Comments)   REACTION: intolerance--She does not remember taking this Rx- states she did not feel well    Sertraline Hcl Other (See Comments)   REACTION: intolerance-- Did not help with Depression        Medication List     STOP taking these medications    oxyCODONE-acetaminophen 5-325 MG tablet Commonly known as: PERCOCET/ROXICET       TAKE these medications    acetaminophen 325 MG tablet Commonly known as: TYLENOL Take 2 tablets (650 mg total) by mouth every 6 (six) hours as needed for mild pain (or Fever >/= 101).   CALCIUM 600 PO Take 600 mg by mouth daily.   cyanocobalamin 1000 MCG tablet Commonly known as: VITAMIN B12 Take 1  tablet (1,000 mcg total) by mouth daily.   Eliquis 2.5 MG Tabs tablet Generic drug: apixaban TAKE 1 TABLET(2.5 MG) BY MOUTH TWICE DAILY   ferrous gluconate 324 MG tablet Commonly known as: FERGON Take 1 tablet (324 mg total) by mouth daily.   furosemide 40 MG tablet Commonly known as: LASIX Take 1 tablet (40 mg total) by mouth daily.   hydrOXYzine 25 MG capsule Commonly known as: VISTARIL Take 1 capsule (25 mg total) by mouth every 8 (eight) hours as needed for anxiety.   mupirocin ointment 2 % Commonly known as: BACTROBAN Apply 1 Application topically 2 (two) times daily.   polyethylene glycol 17 g packet Commonly known as: MIRALAX / GLYCOLAX Take 17 g by mouth 2 (two) times daily. What changed:  when to take this reasons to take this   pregabalin 50 MG capsule Commonly known as: LYRICA TAKE 1 CAPSULE(50 MG) BY MOUTH AT BEDTIME   SYSTANE ULTRA OP Place 1 drop into both eyes daily as needed (for dry eyes).   vitamin C 1000 MG tablet Take 1,000 mg by mouth daily.   Vitamin D 50 MCG (2000 UT) Caps Take 2,000 Units by mouth daily.        Contact information for after-discharge care     Destination     [facility], [place] Preferred SNF .  Service: Skilled Nursing Contact information: [address] [place] [address] [phone]                    Allergies  Allergen Reactions   Codeine Nausea Only   Duloxetine Other (See Comments)    REACTION: intolerance--She does not remember taking this Rx- states she did not feel well    Sertraline Hcl Other (See Comments)    REACTION: intolerance-- Did not help with Depression    Consultations: palliative are/cardiology   Procedures/Studies: ECHOCARDIOGRAM COMPLETE Result Date: 03/19/2023    ECHOCARDIOGRAM REPORT   Patient Name:   Krystal Henderson Date of Exam: 03/19/2023 Medical Rec #:  [id number]         Height:       65.0 in Accession #:    [id number]         Weight:       130.1 lb Date of Birth:  [birth year]          BSA:          1.648 m Patient Age:    89 years          BP:           120/68 mmHg Patient Gender: F                 HR:           111 bpm. Exam Location:  Inpatient Procedure: 2D Echo, Cardiac Doppler and Color Doppler Indications:    CHF Acute Diastolic  History:        Patient has prior history of Echocardiogram examinations, most                 recent 07/23/2021. Arrythmias:Atrial Fibrillation; Risk                 Factors:Hypertension and Dyslipidemia.  Sonographer:    Karma Ganja Referring Phys: [id number] DAVID MANUEL ORTIZ  Sonographer Comments: Image acquisition challenging due to patient body habitus and Image acquisition challenging due to uncooperative patient. IMPRESSIONS  1. Left ventricular ejection fraction, by estimation, is 30 to 35%. The left ventricle has moderately decreased function. The left ventricle demonstrates global hypokinesis. Left ventricular diastolic function could not be evaluated. Elevated left atrial pressure. The E/e' is 21.  2. Right ventricular systolic function is low normal. The right ventricular size is mildly enlarged. There is severely elevated pulmonary artery systolic pressure. The estimated right ventricular systolic pressure is 68.3 mmHg.  3. Left atrial size was severely dilated.  4. Right atrial size was moderately dilated.  5. The mitral valve is normal in structure. Severe mitral valve regurgitation. No evidence of mitral stenosis.  6. Tricuspid valve regurgitation is moderate.  7. The aortic valve is normal in structure. Aortic valve regurgitation is moderate. No aortic stenosis is present.  8. There is dilatation of the ascending aorta, measuring 40 mm.  9. The inferior vena cava is dilated in size with <50% respiratory variability, suggesting right atrial pressure of 15 mmHg. Comparison(s): A prior study was performed on 07/2021. LVEF has reduced from 50-55% to 30-35%, MR and TR have progressed, and estimated  RAP now increased to . FINDINGS  Left Ventricle: Left ventricular ejection fraction, by estimation, is 30 to 35%. The left ventricle has moderately decreased function. The left ventricle demonstrates global hypokinesis. The left ventricular internal cavity size was normal in size. There is borderline left ventricular hypertrophy. Left ventricular diastolic function could not  be evaluated due to atrial fibrillation. Left ventricular diastolic function could not be evaluated. Elevated left atrial pressure. The E/e' is 21. Right Ventricle: The right ventricular size is mildly enlarged. Right vetricular wall thickness was not well visualized. Right ventricular systolic function is low normal. There is severely elevated pulmonary artery systolic pressure. The tricuspid regurgitant velocity is 3.65 m/s, and with an assumed right atrial pressure of 15 mmHg, the estimated right ventricular systolic pressure is 68.3 mmHg. Left Atrium: Left atrial size was severely dilated. Right Atrium: Right atrial size was moderately dilated. Pericardium: There is no evidence of pericardial effusion. Mitral Valve: The mitral valve is normal in structure. Severe mitral valve regurgitation. No evidence of mitral valve stenosis. Tricuspid Valve: The tricuspid valve is normal in structure. Tricuspid valve regurgitation is moderate . No evidence of tricuspid stenosis. Aortic Valve: The aortic valve is normal in structure. Aortic valve regurgitation is moderate. Aortic regurgitation PHT measures 384 msec. No aortic stenosis is present. Aortic valve mean gradient measures 3.0 mmHg. Aortic valve peak gradient measures 5.7 mmHg. Aortic valve area, by VTI measures 1.80 cm. Pulmonic Valve: The pulmonic valve was normal in structure. Pulmonic valve regurgitation is trivial. No evidence of pulmonic stenosis. Aorta: The aortic root is normal in size and structure. There is dilatation of the ascending aorta, measuring 40 mm. Venous: The inferior  vena cava is dilated in size with less than 50% respiratory variability, suggesting right atrial pressure of 15 mmHg. IAS/Shunts: The interatrial septum was not well visualized.  LEFT VENTRICLE PLAX 2D LVIDd:         4.70 cm     Diastology LVIDs:         3.90 cm     LV e' medial:    4.71 cm/s LV PW:         1.20 cm     LV E/e' medial:  28.7 LV IVS:        1.00 cm     LV e' lateral:   10.40 cm/s LVOT diam:     2.00 cm     LV E/e' lateral: 13.0 LV SV:         38 LV SV Index:   23 LVOT Area:     3.14 cm  LV Volumes (MOD) LV vol d, MOD A2C: 85.9 ml LV vol d, MOD A4C: 83.0 ml LV vol s, MOD A2C: 56.8 ml LV vol s, MOD A4C: 50.4 ml LV SV MOD A2C:     29.1 ml LV SV MOD A4C:     83.0 ml LV SV MOD BP:      33.3 ml RIGHT VENTRICLE            IVC RV Basal diam:  4.10 cm    IVC diam: 2.40 cm RV S prime:     8.61 cm/s TAPSE (M-mode): 1.6 cm LEFT ATRIUM              Index        RIGHT ATRIUM           Index LA diam:        5.00 cm  3.03 cm/m   RA Area:     23.40 cm LA Vol (A2C):   82.7 ml  50.19 ml/m  RA Volume:   75.00 ml  45.52 ml/m LA Vol (A4C):   107.0 ml 64.94 ml/m LA Biplane Vol: 97.6 ml  59.24 ml/m  AORTIC VALVE AV Area (Vmax):    1.69 cm AV  Area (Vmean):   1.68 cm AV Area (VTI):     1.80 cm AV Vmax:           119.00 cm/s AV Vmean:          79.133 cm/s AV VTI:            0.212 m AV Peak Grad:      5.7 mmHg AV Mean Grad:      3.0 mmHg LVOT Vmax:         64.03 cm/s LVOT Vmean:        42.200 cm/s LVOT VTI:          0.121 m LVOT/AV VTI ratio: 0.57 AI PHT:            384 msec  AORTA Ao Root diam: 3.30 cm Ao Asc diam:  4.00 cm MITRAL VALVE                TRICUSPID VALVE MV Area (PHT): 5.59 cm     TR Peak grad:   53.3 mmHg MV Decel Time: 136 msec     TR Vmax:        365.00 cm/s MR Peak grad: 119.8 mmHg MR Mean grad: 73.3 mmHg     SHUNTS MR Vmax:      547.33 cm/s   Systemic VTI:  0.12 m MR Vmean:     396.3 cm/s    Systemic Diam: 2.00 cm MV E velocity: 135.33 cm/s Sunit Tolia Electronically signed by Tessa Lerner Signature  Date/Time: 03/19/2023/12:40:00 PM    Final    CT Head Wo Contrast Result Date: 03/17/2023 CLINICAL DATA:  Altered mental status. EXAM: CT HEAD WITHOUT CONTRAST TECHNIQUE: Contiguous axial images were obtained from the base of the skull through the vertex without intravenous contrast. RADIATION DOSE REDUCTION: This exam was performed according to the departmental dose-optimization program which includes automated exposure control, adjustment of the mA and/or kV according to patient size and/or use of iterative reconstruction technique. COMPARISON:  Head CT 07/22/2021. FINDINGS: Brain: No acute intracranial hemorrhage. Gray-white differentiation is preserved. No hydrocephalus or extra-axial collection. No mass effect or midline shift. Vascular: No hyperdense vessel or unexpected calcification. Skull: No calvarial fracture or suspicious bone lesion. Skull base is unremarkable. Sinuses/Orbits: No acute finding. Other: None. IMPRESSION: No acute intracranial abnormality. Electronically Signed   By: Orvan Falconer M.D.   On: 03/17/2023 14:10   DG Chest Port 1 View Result Date: 03/17/2023 CLINICAL DATA:  Questionable sepsis.  Evaluate for abnormality. EXAM: PORTABLE CHEST 1 VIEW COMPARISON:  Chest radiographs 02/04/2023, 07/26/2021; CT chest 09/04/2009 FINDINGS: Cardiac silhouette is again mildly enlarged for AP technique. Mediastinal contours are unchanged and grossly within normal limits. High-grade atherosclerotic calcifications within the aortic arch. Mild bilateral diffuse interstitial thickening is similar to prior. Left apical pleural chronic ossification is unchanged. Prominence of the central pulmonary vasculature is similar to prior. Mildly decreased lung volumes with likely mild bibasilar subsegmental atelectasis, similar to prior. No definite pleural effusion. No pneumothorax. Diffuse decreased bone mineralization. Moderate left and mild right glenohumeral joint space narrowing. IMPRESSION: 1. Mildly  decreased lung volumes with likely mild bibasilar subsegmental atelectasis, similar to prior. No definite focal pneumonia. 2. Mild cardiomegaly and mild pulmonary vascular congestion, similar to prior. Electronically Signed   By: Neita Garnet M.D.   On: 03/17/2023 13:56   CT L-SPINE NO CHARGE Result Date: 03/11/2023 CLINICAL DATA:  Abdominal and back pain, fracture suspected by radiographs EXAM: CT ABDOMEN AND PELVIS WITHOUT CONTRAST CT  LUMBAR SPINE WITHOUT CONTRAST TECHNIQUE: Multidetector CT imaging of the abdomen and pelvis was performed following the standard protocol without IV contrast. Multidetector CT imaging of the lumbar spine was performed following the standard protocol without IV contrast. RADIATION DOSE REDUCTION: This exam was performed according to the departmental dose-optimization program which includes automated exposure control, adjustment of the mA and/or kV according to patient size and/or use of iterative reconstruction technique. COMPARISON:  Same day lumbar spine radiographs, chest radiographs, 02/04/2023 FINDINGS: CT ABDOMEN PELVIS FINDINGS Lower chest: Small right pleural effusion. Cardiomegaly. Moderate hiatal hernia with intrathoracic position of the gastric fundus. Hepatobiliary: No solid liver abnormality is seen. No gallstones, gallbladder wall thickening, or biliary dilatation. Pancreas: Unremarkable. No pancreatic ductal dilatation or surrounding inflammatory changes. Spleen: Normal in size without significant abnormality. Adrenals/Urinary Tract: Adrenal glands are unremarkable. Atrophic right kidney. Nonobstructive calculi of the inferior pole of the right kidney. No ureteral calculi or hydronephrosis. Left kidney is normal, without renal calculi, solid lesion, or hydronephrosis. Bladder is unremarkable. Stomach/Bowel: Stomach is within normal limits. Appendix appears normal. No evidence of bowel wall thickening, distention, or inflammatory changes. Descending and sigmoid  diverticulosis. Vascular/Lymphatic: Aortic atherosclerosis. No enlarged abdominal or pelvic lymph nodes. Reproductive: No mass or other abnormality. Other: No abdominal wall hernia or abnormality. No ascites. Musculoskeletal: Status post right hip total arthroplasty. Chronic fracture deformities of the inferior pubic rami. CT LUMBAR SPINE FINDINGS Alignment: Dextroscoliosis of the thoracolumbar spine as well as focal kyphosis at T12-L1 secondary to fractures. Vertebral bodies: Acute compression fracture of the T12 vertebral body with approximately 50% height loss both anterior and posteriorly. Chronic, although significantly increased high-grade wedge deformity of L1 when compared to recent radiographs dated 02/04/2023. There is 0.9 cm of fragment retropulsion into the spinal canal (series 44, image 30). Disc spaces: Moderate to severe multilevel disc space height loss and osteophytosis throughout the lumbar spine, worst at L3-L4 and L5-S1. Paraspinous soft tissues: Unremarkable. IMPRESSION: 1. Acute compression fracture of the T12 vertebral body with approximately 50% height loss both anterior and posteriorly. 2. Chronic, although significantly increased high-grade wedge deformity of L1 when compared to recent radiographs dated 02/04/2023. There is 0.9 cm of fragment retropulsion into the spinal canal. 3. Dextroscoliosis of the thoracolumbar spine as well as focal kyphosis at T12-L1 secondary to fractures. 4. No CT evidence of acute traumatic injury to the organs of the abdomen or pelvis. 5. Small right pleural effusion. 6. Cardiomegaly. 7. Nonobstructive right nephrolithiasis.  No hydronephrosis. 8. Descending and sigmoid diverticulosis without evidence of acute diverticulitis. Aortic Atherosclerosis (ICD10-I70.0). Electronically Signed   By: Jearld Lesch M.D.   On: 03/11/2023 18:04   CT ABDOMEN PELVIS WO CONTRAST Result Date: 03/11/2023 CLINICAL DATA:  Abdominal and back pain, fracture suspected by  radiographs EXAM: CT ABDOMEN AND PELVIS WITHOUT CONTRAST CT LUMBAR SPINE WITHOUT CONTRAST TECHNIQUE: Multidetector CT imaging of the abdomen and pelvis was performed following the standard protocol without IV contrast. Multidetector CT imaging of the lumbar spine was performed following the standard protocol without IV contrast. RADIATION DOSE REDUCTION: This exam was performed according to the departmental dose-optimization program which includes automated exposure control, adjustment of the mA and/or kV according to patient size and/or use of iterative reconstruction technique. COMPARISON:  Same day lumbar spine radiographs, chest radiographs, 02/04/2023 FINDINGS: CT ABDOMEN PELVIS FINDINGS Lower chest: Small right pleural effusion. Cardiomegaly. Moderate hiatal hernia with intrathoracic position of the gastric fundus. Hepatobiliary: No solid liver abnormality is seen. No gallstones, gallbladder wall thickening, or  biliary dilatation. Pancreas: Unremarkable. No pancreatic ductal dilatation or surrounding inflammatory changes. Spleen: Normal in size without significant abnormality. Adrenals/Urinary Tract: Adrenal glands are unremarkable. Atrophic right kidney. Nonobstructive calculi of the inferior pole of the right kidney. No ureteral calculi or hydronephrosis. Left kidney is normal, without renal calculi, solid lesion, or hydronephrosis. Bladder is unremarkable. Stomach/Bowel: Stomach is within normal limits. Appendix appears normal. No evidence of bowel wall thickening, distention, or inflammatory changes. Descending and sigmoid diverticulosis. Vascular/Lymphatic: Aortic atherosclerosis. No enlarged abdominal or pelvic lymph nodes. Reproductive: No mass or other abnormality. Other: No abdominal wall hernia or abnormality. No ascites. Musculoskeletal: Status post right hip total arthroplasty. Chronic fracture deformities of the inferior pubic rami. CT LUMBAR SPINE FINDINGS Alignment: Dextroscoliosis of the  thoracolumbar spine as well as focal kyphosis at T12-L1 secondary to fractures. Vertebral bodies: Acute compression fracture of the T12 vertebral body with approximately 50% height loss both anterior and posteriorly. Chronic, although significantly increased high-grade wedge deformity of L1 when compared to recent radiographs dated 02/04/2023. There is 0.9 cm of fragment retropulsion into the spinal canal (series 44, image 30). Disc spaces: Moderate to severe multilevel disc space height loss and osteophytosis throughout the lumbar spine, worst at L3-L4 and L5-S1. Paraspinous soft tissues: Unremarkable. IMPRESSION: 1. Acute compression fracture of the T12 vertebral body with approximately 50% height loss both anterior and posteriorly. 2. Chronic, although significantly increased high-grade wedge deformity of L1 when compared to recent radiographs dated 02/04/2023. There is 0.9 cm of fragment retropulsion into the spinal canal. 3. Dextroscoliosis of the thoracolumbar spine as well as focal kyphosis at T12-L1 secondary to fractures. 4. No CT evidence of acute traumatic injury to the organs of the abdomen or pelvis. 5. Small right pleural effusion. 6. Cardiomegaly. 7. Nonobstructive right nephrolithiasis.  No hydronephrosis. 8. Descending and sigmoid diverticulosis without evidence of acute diverticulitis. Aortic Atherosclerosis (ICD10-I70.0). Electronically Signed   By: Jearld Lesch M.D.   On: 03/11/2023 18:04   DG Thoracic Spine 2 View Result Date: 03/11/2023 CLINICAL DATA:  Back pain, limited mobility EXAM: THORACIC SPINE 2 VIEWS COMPARISON:  03/22/2016 FINDINGS: Frontal and lateral views of the thoracic spine are obtained. Evaluation is extremely limited due to patient positioning due to discomfort. Imaging is essentially nondiagnostic due to body habitus and suboptimal positioning. Upper thoracic spine not well visualized due to overlying structures. Midthoracic spine appears grossly unremarkable. Compression  deformities at the thoracolumbar junction cannot be excluded on frontal view. CT is recommended if further evaluation is desired. IMPRESSION: 1. Extremely limited study as above. Compression deformities at the thoracolumbar junction cannot be excluded based on frontal projection. CT may be useful for further evaluation. Electronically Signed   By: Sharlet Salina M.D.   On: 03/11/2023 17:28   DG Lumbar Spine Complete Result Date: 03/11/2023 CLINICAL DATA:  Back pain EXAM: LUMBAR SPINE - COMPLETE 4+ VIEW COMPARISON:  03/22/2016 FINDINGS: Two lateral views of the lumbar spine are submitted. Additional views could not be obtained due to patient discomfort. Kyphotic angulation at the thoracolumbar junction consistent with known L1 vertebral plana. There may be an additional fracture of the T12 vertebral body since prior exam, though evaluation is extremely limited due to positioning and body habitus. CT is recommended for further evaluation. IMPRESSION: 1. Essentially nondiagnostic lateral views of the lumbar spine, with additional views unable to be obtained due to patient discomfort. Suspected T12 fracture in addition to a chronic L1 vertebra plana. CT is recommended. Electronically Signed   By: Casimiro Needle  Manson Passey M.D.   On: 03/11/2023 17:26     Discharge Exam: Vitals:   03/23/23 1956 03/24/23 0355  BP: (!) 148/78 (!) 120/54  Pulse: 97 80  Resp: 18 16  Temp: 97.9 F (36.6 C) 98.6 F (37 C)  SpO2: 100% 92%   Vitals:   03/23/23 0915 03/23/23 1349 03/23/23 1956 03/24/23 0355  BP: 125/67 139/85 (!) 148/78 (!) 120/54  Pulse: (!) 106 93 97 80  Resp:  16 18 16   Temp:  98.6 F (37 C) 97.9 F (36.6 C) 98.6 F (37 C)  TempSrc:  Axillary Oral Oral  SpO2:  100% 100% 92%  Weight:      Height:        General: Pt is alert, awake, not in acute distress Cardiovascular: RRR, S1/S2 +, no rubs, no gallops Respiratory: CTA bilaterally, no wheezing, no rhonchi Abdominal: Soft, NT, ND, bowel sounds  + Extremities: no edema, no cyanosis    The results of significant diagnostics from this hospitalization (including imaging, microbiology, ancillary and laboratory) are listed below for reference.     Microbiology: Recent Results (from the past 240 hours)  Blood Culture (routine x 2)     Status: None   Collection Time: 03/17/23 12:15 PM   Specimen: BLOOD  Result Value Ref Range Status   Specimen Description   Final    BLOOD RIGHT ANTECUBITAL Performed at [facility], [address], [place], [place] [address]    Special Requests   Final    BOTTLES DRAWN AEROBIC AND ANAEROBIC Blood Culture results may not be optimal due to an inadequate volume of blood received in culture bottles Performed at [facility], [address] [place], [place] [address]    Culture   Final    NO GROWTH 5 DAYS Performed at [facility], [address], [place], [place] [address]    Report Status 03/22/2023 FINAL  Final  Blood Culture (routine x 2)     Status: None   Collection Time: 03/17/23 12:20 PM   Specimen: BLOOD  Result Value Ref Range Status   Specimen Description   Final    BLOOD BLOOD LEFT ARM Performed at [facility], [address], [place], [place] [address]    Special Requests   Final    BOTTLES DRAWN AEROBIC AND ANAEROBIC Blood Culture results may not be optimal due to an inadequate volume of blood received in culture bottles Performed at [facility], [address] [place], [place] [address]    Culture   Final    NO GROWTH 5 DAYS Performed at [facility], [address], [place], [place] [address]    Report Status 03/22/2023 FINAL  Final  Resp panel by RT-PCR (RSV, Flu A&B, Covid) Anterior Nasal Swab     Status: None   Collection Time: 03/17/23 12:22 PM   Specimen: Anterior Nasal Swab  Result Value Ref Range Status   SARS Coronavirus 2 by RT PCR NEGATIVE NEGATIVE Final    Comment:  (NOTE) SARS-CoV-2 target nucleic acids are NOT DETECTED.  The SARS-CoV-2 RNA is generally detectable in upper respiratory specimens during the acute phase of infection. The lowest concentration of SARS-CoV-2 viral copies this assay can detect is 138 copies/mL. A negative result does not preclude SARS-Cov-2 infection and should not be used as the sole basis for treatment or other patient management decisions. A negative result may occur with  improper specimen collection/handling, submission of  specimen other than nasopharyngeal swab, presence of viral mutation(s) within the areas targeted by this assay, and inadequate number of viral copies(<138 copies/mL). A negative result must be combined with clinical observations, patient history, and epidemiological information. The expected result is Negative.  Fact Sheet for Patients:  BloggerCourse.com  Fact Sheet for Healthcare Providers:  SeriousBroker.it  This test is no t yet approved or cleared by the [place] FDA and  has been authorized for detection and/or diagnosis of SARS-CoV-2 by FDA under an Emergency Use Authorization (EUA). This EUA will remain  in effect (meaning this test can be used) for the duration of the COVID-19 declaration under Section 564(b)(1) of the Act, 21 U.S.C.section 360bbb-3(b)(1), unless the authorization is terminated  or revoked sooner.       Influenza A by PCR NEGATIVE NEGATIVE Final   Influenza B by PCR NEGATIVE NEGATIVE Final    Comment: (NOTE) The Xpert Xpress SARS-CoV-2/FLU/RSV plus assay is intended as an aid in the diagnosis of influenza from Nasopharyngeal swab specimens and should not be used as a sole basis for treatment. Nasal washings and aspirates are unacceptable for Xpert Xpress SARS-CoV-2/FLU/RSV testing.  Fact Sheet for Patients: BloggerCourse.com  Fact Sheet for Healthcare  Providers: SeriousBroker.it  This test is not yet approved or cleared by the [place] FDA and has been authorized for detection and/or diagnosis of SARS-CoV-2 by FDA under an Emergency Use Authorization (EUA). This EUA will remain in effect (meaning this test can be used) for the duration of the COVID-19 declaration under Section 564(b)(1) of the Act, 21 U.S.C. section 360bbb-3(b)(1), unless the authorization is terminated or revoked.     Resp Syncytial Virus by PCR NEGATIVE NEGATIVE Final    Comment: (NOTE) Fact Sheet for Patients: BloggerCourse.com  Fact Sheet for Healthcare Providers: SeriousBroker.it  This test is not yet approved or cleared by the [place] FDA and has been authorized for detection and/or diagnosis of SARS-CoV-2 by FDA under an Emergency Use Authorization (EUA). This EUA will remain in effect (meaning this test can be used) for the duration of the COVID-19 declaration under Section 564(b)(1) of the Act, 21 U.S.C. section 360bbb-3(b)(1), unless the authorization is terminated or revoked.  Performed at [facility], [address], [place], [place] [address]      Labs: BNP (last 3 results) Recent Labs    02/04/23 2107 03/17/23 1220  BNP 514.8* 1,035.9*   Basic Metabolic Panel: Recent Labs  Lab 03/19/23 0518 03/20/23 0519 03/21/23 1132 03/22/23 0522 03/23/23 0518  NA 134* 135 136 139 137  K 3.9 4.0 4.1 3.8 3.8  CL 99 102 103 101 98  CO2 25 25 26 29 29   GLUCOSE 97 100* 114* 98 94  BUN 30* 26* 27* 32* 33*  CREATININE 1.78* 1.56* 1.40* 1.64* 1.71*  CALCIUM 8.7* 8.7* 8.9 9.2 9.2   Liver Function Tests: Recent Labs  Lab 03/18/23 0507  AST 14*  ALT 17  ALKPHOS 69  BILITOT 0.7  PROT 6.3*  ALBUMIN 3.0*   No results for input(s): "LIPASE", "AMYLASE" in the last 168 hours. Recent Labs  Lab 03/18/23 1050  AMMONIA 11   CBC: Recent  Labs  Lab 03/18/23 0507 03/21/23 1132 03/22/23 0522 03/23/23 0518  WBC 6.1 7.4 8.1 7.4  HGB 11.8* 12.1 12.0 12.2  HCT 37.3 37.1 36.8 37.7  MCV 95.9 94.2 93.4 93.5  PLT 285 327 342 336   Cardiac Enzymes: No results for input(s): "CKTOTAL", "CKMB", "CKMBINDEX", "TROPONINI" in the  last 168 hours. BNP: Invalid input(s): "POCBNP" CBG: Recent Labs  Lab 03/23/23 1124 03/23/23 1557 03/23/23 1957 03/24/23 0001 03/24/23 0352  GLUCAP 101* 103* 101* 89 88   D-Dimer No results for input(s): "DDIMER" in the last 72 hours. Hgb A1c No results for input(s): "HGBA1C" in the last 72 hours. Lipid Profile No results for input(s): "CHOL", "HDL", "LDLCALC", "TRIG", "CHOLHDL", "LDLDIRECT" in the last 72 hours. Thyroid function studies No results for input(s): "TSH", "T4TOTAL", "T3FREE", "THYROIDAB" in the last 72 hours.  Invalid input(s): "FREET3" Anemia work up No results for input(s): "VITAMINB12", "FOLATE", "FERRITIN", "TIBC", "IRON", "RETICCTPCT" in the last 72 hours. Urinalysis    Component Value Date/Time   COLORURINE YELLOW 03/18/2023 1043   APPEARANCEUR CLEAR 03/18/2023 1043   LABSPEC 1.013 03/18/2023 1043   PHURINE 5.0 03/18/2023 1043   GLUCOSEU NEGATIVE 03/18/2023 1043   HGBUR NEGATIVE 03/18/2023 1043   HGBUR moderate 03/06/2009 0824   BILIRUBINUR NEGATIVE 03/18/2023 1043   BILIRUBINUR neg 12/22/2017 1556   KETONESUR 5 (A) 03/18/2023 1043   PROTEINUR NEGATIVE 03/18/2023 1043   UROBILINOGEN 0.2 12/22/2017 1556   UROBILINOGEN 0.2 01/01/2012 1633   NITRITE NEGATIVE 03/18/2023 1043   LEUKOCYTESUR NEGATIVE 03/18/2023 1043   Sepsis Labs Recent Labs  Lab 03/18/23 0507 03/21/23 1132 03/22/23 0522 03/23/23 0518  WBC 6.1 7.4 8.1 7.4   Microbiology Recent Results (from the past 240 hours)  Blood Culture (routine x 2)     Status: None   Collection Time: 03/17/23 12:15 PM   Specimen: BLOOD  Result Value Ref Range Status   Specimen Description   Final    BLOOD RIGHT  ANTECUBITAL Performed at [facility], [address] [place], [place] [address]    Special Requests   Final    BOTTLES DRAWN AEROBIC AND ANAEROBIC Blood Culture results may not be optimal due to an inadequate volume of blood received in culture bottles Performed at [facility], [address], [place], [place] [address]    Culture   Final    NO GROWTH 5 DAYS Performed at [facility], [address], [place], [place] [address]    Report Status 03/22/2023 FINAL  Final  Blood Culture (routine x 2)     Status: None   Collection Time: 03/17/23 12:20 PM   Specimen: BLOOD  Result Value Ref Range Status   Specimen Description   Final    BLOOD BLOOD LEFT ARM Performed at [facility], [address], [place], [place] [address]    Special Requests   Final    BOTTLES DRAWN AEROBIC AND ANAEROBIC Blood Culture results may not be optimal due to an inadequate volume of blood received in culture bottles Performed at [facility], [address] [place], [place] [address]    Culture   Final    NO GROWTH 5 DAYS Performed at [facility], [address], [place], [place] [address]    Report Status 03/22/2023 FINAL  Final  Resp panel by RT-PCR (RSV, Flu A&B, Covid) Anterior Nasal Swab     Status: None   Collection Time: 03/17/23 12:22 PM   Specimen: Anterior Nasal Swab  Result Value Ref Range Status   SARS Coronavirus 2 by RT PCR NEGATIVE NEGATIVE Final    Comment: (NOTE) SARS-CoV-2 target nucleic acids are NOT DETECTED.  The SARS-CoV-2 RNA is generally detectable in upper respiratory specimens during the acute phase of infection. The lowest concentration of SARS-CoV-2 viral  copies this assay can detect is 138 copies/mL. A negative result does not preclude SARS-Cov-2 infection and should not be used as the sole basis for treatment or other patient management decisions. A negative result may occur with   improper specimen collection/handling, submission of specimen other than nasopharyngeal swab, presence of viral mutation(s) within the areas targeted by this assay, and inadequate number of viral copies(<138 copies/mL). A negative result must be combined with clinical observations, patient history, and epidemiological information. The expected result is Negative.  Fact Sheet for Patients:  BloggerCourse.com  Fact Sheet for Healthcare Providers:  SeriousBroker.it  This test is no t yet approved or cleared by the [place] FDA and  has been authorized for detection and/or diagnosis of SARS-CoV-2 by FDA under an Emergency Use Authorization (EUA). This EUA will remain  in effect (meaning this test can be used) for the duration of the COVID-19 declaration under Section 564(b)(1) of the Act, 21 U.S.C.section 360bbb-3(b)(1), unless the authorization is terminated  or revoked sooner.       Influenza A by PCR NEGATIVE NEGATIVE Final   Influenza B by PCR NEGATIVE NEGATIVE Final    Comment: (NOTE) The Xpert Xpress SARS-CoV-2/FLU/RSV plus assay is intended as an aid in the diagnosis of influenza from Nasopharyngeal swab specimens and should not be used as a sole basis for treatment. Nasal washings and aspirates are unacceptable for Xpert Xpress SARS-CoV-2/FLU/RSV testing.  Fact Sheet for Patients: BloggerCourse.com  Fact Sheet for Healthcare Providers: SeriousBroker.it  This test is not yet approved or cleared by the [place] FDA and has been authorized for detection and/or diagnosis of SARS-CoV-2 by FDA under an Emergency Use Authorization (EUA). This EUA will remain in effect (meaning this test can be used) for the duration of the COVID-19 declaration under Section 564(b)(1) of the Act, 21 U.S.C. section 360bbb-3(b)(1), unless the authorization is terminated or revoked.      Resp Syncytial Virus by PCR NEGATIVE NEGATIVE Final    Comment: (NOTE) Fact Sheet for Patients: BloggerCourse.com  Fact Sheet for Healthcare Providers: SeriousBroker.it  This test is not yet approved or cleared by the [place] FDA and has been authorized for detection and/or diagnosis of SARS-CoV-2 by FDA under an Emergency Use Authorization (EUA). This EUA will remain in effect (meaning this test can be used) for the duration of the COVID-19 declaration under Section 564(b)(1) of the Act, 21 U.S.C. section 360bbb-3(b)(1), unless the authorization is terminated or revoked.  Performed at [facility], [address] [place], [place] [address]     FURTHER DISCHARGE INSTRUCTIONS:   Get Medicines reviewed and adjusted: Please take all your medications with you for your next visit with your Primary MD   Laboratory/radiological data: Please request your Primary MD to go over all hospital tests and procedure/radiological results at the follow up, please ask your Primary MD to get all Hospital records sent to his/her office.   In some cases, they will be blood work, cultures and biopsy results pending at the time of your discharge. Please request that your primary care M.D. goes through all the records of your hospital data and follows up on these results.   Also Note the following: If you experience worsening of your admission symptoms, develop shortness of breath, life threatening emergency, suicidal or homicidal thoughts you must seek medical attention immediately by calling 911 or calling your MD immediately  if symptoms less severe.   You must read complete instructions/literature along with all the possible  adverse reactions/side effects for all the Medicines you take and that have been prescribed to you. Take any new Medicines after you have completely understood and accpet all the possible adverse  reactions/side effects.    Do not drive when taking Pain medications or sleeping medications (Benzodaizepines)   Do not take more than prescribed Pain, Sleep and Anxiety Medications. It is not advisable to combine anxiety,sleep and pain medications without talking with your primary care practitioner   Special Instructions: If you have smoked or chewed Tobacco  in the last 2 yrs please stop smoking, stop any regular Alcohol  and or any Recreational drug use.   Wear Seat belts while driving.   Please note: You were cared for by a hospitalist during your hospital stay. Once you are discharged, your primary care physician will handle any further medical issues. Please note that NO REFILLS for any discharge medications will be authorized once you are discharged, as it is imperative that you return to your primary care physician (or establish a relationship with a primary care physician if you do not have one) for your post hospital discharge needs so that they can reassess your need for medications and monitor your lab values  Time coordinating discharge: Over 30 minutes  SIGNED:   Hughie Closs, MD  [facility] 03/24/2023, 3:47 PM *Please note that this is a verbal dictation therefore any spelling or grammatical errors are due to the "Dragon Medical One" system interpretation. If 7PM-7AM, please contact night-coverage [email or web address]

## 2023-03-24 NOTE — Progress Notes (Signed)
[facility] Liaison Note  Received request from [facility] for hospice services at home after discharge. Spoke with patient's husband Peyton Najjar to initiate education related to hospice philosophy, services and team approach to care. Husband verbalized understanding of information given. Per discussion, there is not a plan for discharge yet. DME needs discussed. Patient has the following equipment in the home: wheelchair and rollator. Husband requests that other DME needs be assessed by hospice admissions nurse.    Please send signed and completed DNR home with patient/family. Please provide prescriptions at discharge as needed to ensure ongoing symptom management.  [facility] information and contact numbers given to [place]. Please call with any concerns.  Thank you for the opportunity to participate in this patient's care.   Henderson Newcomer, LPN [facility] Liaison [phone]

## 2023-03-24 NOTE — Progress Notes (Signed)
Daily Progress Note   Patient Name: Krystal Henderson       Date: 03/24/2023 DOB: 02-09-1933  Age: 89 y.o. MRN#: [id number] Attending Physician: Hughie Closs, MD Primary Care Physician: Zola Button, Grayling Congress, DO Admit Date: 03/17/2023 Length of Stay: 7 days  Reason for Consultation/Follow-up: Establishing goals of care and Pain control  Subjective:   CC: Patient now minimally responsive. Following up regarding complex medical decision making.  Subjective:  Reviewed EMR prior to presenting to bedside.  Also discussed care with hospitalist and RN for medical updates.  Medical status continued to deteriorate.  Presented to bedside to meet with patient.  Patient's husband, Krystal Henderson, present at bedside.  Krystal Henderson confirmed that patient's medical status has been deteriorating overnight.  Patient laying in bed minimally responsive and opening eyes at times.  Patient will not speak.  Discussed instead of seeking home hospice at this time, patient likely more appropriate for admission to inpatient hospice at [facility].  Krystal Henderson had already discussed with [facility] regarding home hospice and it would reach out to their liaison regarding [facility] evaluation.  Krystal Henderson agreeing with this plan.  Discussed would provide IV medications for comfort at this time.  Spent time providing emotional support reactive listening.  Noted will consult chaplain as well for spiritual support.  All questions answered at that time.  Noted palliative medicine team will continue to follow with patient's medical journey.  Objective:   Vital Signs:  BP (!) 120/54 (BP Location: Left Arm)   Pulse 80   Temp 98.6 F (37 C) (Oral)   Resp 16   Ht 5\' 5"  (1.651 m)   Wt 60.1 kg   SpO2 92%   BMI 22.05 kg/m   Physical Exam: General: minimally responsive, laying in bed, chronically ill-appearing, frail Cardiovascular: RRR Respiratory: no increased work of breathing noted, not in respiratory distress Neuro: minimally  responsive  Imaging: I personally reviewed recent imaging.   Assessment & Plan:   Assessment: Patient is an 89 year old female with a past medical history of paroxysmal A-fib, new onset acute heart failure with reduced ejection fraction of 30-35%, CKD, and pain secondary to compression fracture who was admitted on 03/17/2023 for management of change in mental status.  Palliative medicine team consulted to assist with pain control.  Recommendations/Plan: # Complex medical decision making/goals of care:  - Patient's medical status has continued to deteriorate.  At this time patient more appropriate for evaluation or admission to inpatient hospice.  Patient's husband, Krystal Henderson, had already Armed forces training and education officer for home hospice group so will reach out to liaison regarding [facility] evaluation which husband agrees with.  Continuing to focus on comfort focused care at the end of life.  -  Code Status: Do not attempt resuscitation (DNR) - Comfort care  # Symptom management:  -Back pain, acute on chronic in setting of compression fracture of the T12 vertebrae Discontinue oral medications as patient now unable to participate in taking.   -Change IV Dilaudid 0.5 mg every 30 mins as needed. Continue to adjust based on patient's symptom burden.     -Anxiety/agitation, in the setting of end-of-life care                               -Continue Haldol 0.5 mg every 4 hours as needed. Continue to adjust based on patient's symptom burden.     -Start IV ativan 0.5mg  q4hrs prn                 -  Secretions, in the setting of end-of-life care                               -Continue glycopyrrolate every 4 hours as needed.  # Psychosocial Support:  -Husband  # Discharge Planning: Hospice facility evaluation  Discussed with: patient's husband, TOC, RN, hospitalist, [facility] liaison   Thank you for allowing the palliative care team to participate in the care Krystal Henderson.  Alvester Morin, DO Palliative Care  Provider PMT # [phone]  If patient remains symptomatic despite maximum doses, please call PMT at [phone] between 0700 and 1900. Outside of these hours, please call attending, as PMT does not have night coverage.  *Please note that this is a verbal dictation therefore any spelling or grammatical errors are due to the "Dragon Medical One" system interpretation.

## 2023-03-28 ENCOUNTER — Telehealth: Payer: Self-pay | Admitting: Family Medicine

## 2023-03-28 NOTE — Telephone Encounter (Signed)
Copied from CRM [id number]. Topic: General - Deceased Patient >> 08-Apr-2023 12:49 PM Irine Seal wrote: Name of caller: [facility]  Date of death: 04-06-2023    Name of funeral home:     Phone number of funeral home:     Provider that needs to sign form:     Timeline for signing:

## 2023-04-06 DEATH — deceased
